# Patient Record
Sex: Female | Born: 1965
Health system: Southern US, Community
[De-identification: ages and names within clinical notes are randomized; demographics above are authoritative.]

## PROBLEM LIST (undated history)

## (undated) DIAGNOSIS — F319 Bipolar disorder, unspecified: Secondary | ICD-10-CM

## (undated) DIAGNOSIS — K760 Fatty (change of) liver, not elsewhere classified: Secondary | ICD-10-CM

## (undated) DIAGNOSIS — J309 Allergic rhinitis, unspecified: Secondary | ICD-10-CM

## (undated) DIAGNOSIS — R0989 Other specified symptoms and signs involving the circulatory and respiratory systems: Secondary | ICD-10-CM

## (undated) DIAGNOSIS — H9190 Unspecified hearing loss, unspecified ear: Secondary | ICD-10-CM

## (undated) DIAGNOSIS — K5909 Other constipation: Secondary | ICD-10-CM

## (undated) DIAGNOSIS — M7061 Trochanteric bursitis, right hip: Secondary | ICD-10-CM

## (undated) DIAGNOSIS — F329 Major depressive disorder, single episode, unspecified: Secondary | ICD-10-CM

## (undated) DIAGNOSIS — Z720 Tobacco use: Secondary | ICD-10-CM

## (undated) DIAGNOSIS — F909 Attention-deficit hyperactivity disorder, unspecified type: Secondary | ICD-10-CM

## (undated) DIAGNOSIS — M1990 Unspecified osteoarthritis, unspecified site: Secondary | ICD-10-CM

## (undated) DIAGNOSIS — M25519 Pain in unspecified shoulder: Secondary | ICD-10-CM

## (undated) DIAGNOSIS — E119 Type 2 diabetes mellitus without complications: Secondary | ICD-10-CM

## (undated) DIAGNOSIS — M549 Dorsalgia, unspecified: Secondary | ICD-10-CM

## (undated) DIAGNOSIS — G4730 Sleep apnea, unspecified: Secondary | ICD-10-CM

## (undated) DIAGNOSIS — I10 Essential (primary) hypertension: Secondary | ICD-10-CM

## (undated) DIAGNOSIS — R232 Flushing: Secondary | ICD-10-CM

## (undated) DIAGNOSIS — G43109 Migraine with aura, not intractable, without status migrainosus: Secondary | ICD-10-CM

## (undated) DIAGNOSIS — F32A Depression, unspecified: Secondary | ICD-10-CM

## (undated) DIAGNOSIS — R160 Hepatomegaly, not elsewhere classified: Secondary | ICD-10-CM

## (undated) DIAGNOSIS — E785 Hyperlipidemia, unspecified: Secondary | ICD-10-CM

## (undated) DIAGNOSIS — IMO0001 Reserved for inherently not codable concepts without codable children: Secondary | ICD-10-CM

## (undated) DIAGNOSIS — J449 Chronic obstructive pulmonary disease, unspecified: Secondary | ICD-10-CM

## (undated) DIAGNOSIS — F429 Obsessive-compulsive disorder, unspecified: Secondary | ICD-10-CM

## (undated) DIAGNOSIS — M5431 Sciatica, right side: Secondary | ICD-10-CM

## (undated) DIAGNOSIS — B070 Plantar wart: Secondary | ICD-10-CM

## (undated) DIAGNOSIS — F419 Anxiety disorder, unspecified: Secondary | ICD-10-CM

## (undated) DIAGNOSIS — K219 Gastro-esophageal reflux disease without esophagitis: Secondary | ICD-10-CM

## (undated) DIAGNOSIS — G8929 Other chronic pain: Secondary | ICD-10-CM

## (undated) DIAGNOSIS — F5104 Psychophysiologic insomnia: Secondary | ICD-10-CM

## (undated) DIAGNOSIS — E7800 Pure hypercholesterolemia, unspecified: Secondary | ICD-10-CM

## (undated) HISTORY — DX: Chronic obstructive pulmonary disease, unspecified: J44.9

## (undated) HISTORY — PX: TUBAL LIGATION: SHX77

## (undated) HISTORY — DX: Other chronic pain: G89.29

## (undated) HISTORY — DX: Major depressive disorder, single episode, unspecified: F32.9

## (undated) HISTORY — DX: Psychophysiologic insomnia: F51.04

## (undated) HISTORY — DX: Morbid (severe) obesity due to excess calories: E66.01

## (undated) HISTORY — DX: Unspecified hearing loss, unspecified ear: H91.90

## (undated) HISTORY — DX: Fatty (change of) liver, not elsewhere classified: K76.0

## (undated) HISTORY — DX: Allergic rhinitis, unspecified: J30.9

## (undated) HISTORY — DX: Essential (primary) hypertension: I10

## (undated) HISTORY — DX: Hepatomegaly, not elsewhere classified: R16.0

## (undated) HISTORY — PX: JOINT REPLACEMENT: SHX530

## (undated) HISTORY — PX: LIPOMA EXCISION: SHX5283

## (undated) HISTORY — DX: Other constipation: K59.09

## (undated) HISTORY — PX: FOOT SURGERY: SHX648

## (undated) HISTORY — PX: BILATERAL SALPINGOOPHORECTOMY: SHX1223

## (undated) HISTORY — DX: Anxiety disorder, unspecified: F41.9

## (undated) HISTORY — DX: Dorsalgia, unspecified: M54.9

## (undated) HISTORY — DX: Flushing: R23.2

## (undated) HISTORY — DX: Plantar wart: B07.0

## (undated) HISTORY — DX: Hyperlipidemia, unspecified: E78.5

## (undated) HISTORY — DX: Depression, unspecified: F32.A

## (undated) HISTORY — DX: Trochanteric bursitis, right hip: M70.61

## (undated) HISTORY — DX: Other specified symptoms and signs involving the circulatory and respiratory systems: R09.89

## (undated) HISTORY — PX: TONSILLECTOMY: SUR1361

## (undated) HISTORY — DX: Migraine with aura, not intractable, without status migrainosus: G43.109

## (undated) HISTORY — DX: Tobacco use: Z72.0

## (undated) HISTORY — DX: Obsessive-compulsive disorder, unspecified: F42.9

## (undated) HISTORY — PX: OTHER SURGICAL HISTORY: SHX169

---
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
## 2004-02-10 ENCOUNTER — Ambulatory Visit: Payer: Self-pay

## 2004-02-23 ENCOUNTER — Ambulatory Visit: Payer: Self-pay

## 2004-03-02 ENCOUNTER — Ambulatory Visit: Payer: Self-pay

## 2004-04-08 ENCOUNTER — Ambulatory Visit: Payer: Self-pay | Admitting: Internal Medicine

## 2004-04-25 ENCOUNTER — Emergency Department: Payer: Self-pay | Admitting: Emergency Medicine

## 2004-10-25 ENCOUNTER — Ambulatory Visit: Payer: Self-pay

## 2004-12-09 ENCOUNTER — Emergency Department: Payer: Self-pay | Admitting: Emergency Medicine

## 2005-03-16 ENCOUNTER — Encounter: Payer: Self-pay | Admitting: Neurosurgery

## 2005-06-15 ENCOUNTER — Ambulatory Visit: Payer: Self-pay | Admitting: Internal Medicine

## 2005-08-31 ENCOUNTER — Ambulatory Visit: Payer: Self-pay | Admitting: Anesthesiology

## 2005-09-29 ENCOUNTER — Ambulatory Visit: Payer: Self-pay | Admitting: Anesthesiology

## 2005-11-03 ENCOUNTER — Ambulatory Visit: Payer: Self-pay | Admitting: Anesthesiology

## 2005-11-23 ENCOUNTER — Ambulatory Visit: Payer: Self-pay | Admitting: Anesthesiology

## 2006-01-05 ENCOUNTER — Ambulatory Visit: Payer: Self-pay | Admitting: Anesthesiology

## 2006-02-07 ENCOUNTER — Ambulatory Visit: Payer: Self-pay | Admitting: Anesthesiology

## 2006-02-12 ENCOUNTER — Emergency Department: Payer: Self-pay | Admitting: Emergency Medicine

## 2006-05-11 ENCOUNTER — Ambulatory Visit: Payer: Self-pay | Admitting: Gastroenterology

## 2006-06-15 ENCOUNTER — Ambulatory Visit: Payer: Self-pay | Admitting: Family Medicine

## 2006-08-04 ENCOUNTER — Emergency Department: Payer: Self-pay | Admitting: Emergency Medicine

## 2006-08-05 ENCOUNTER — Other Ambulatory Visit: Payer: Self-pay

## 2006-08-31 ENCOUNTER — Ambulatory Visit: Payer: Self-pay | Admitting: Anesthesiology

## 2006-10-25 ENCOUNTER — Ambulatory Visit: Payer: Self-pay | Admitting: Unknown Physician Specialty

## 2007-03-06 ENCOUNTER — Emergency Department: Payer: Self-pay | Admitting: Unknown Physician Specialty

## 2007-03-19 ENCOUNTER — Ambulatory Visit: Payer: Self-pay | Admitting: Podiatry

## 2007-03-23 ENCOUNTER — Ambulatory Visit: Payer: Self-pay | Admitting: Podiatry

## 2007-06-05 ENCOUNTER — Ambulatory Visit: Payer: Self-pay | Admitting: Podiatry

## 2007-06-08 ENCOUNTER — Ambulatory Visit: Payer: Self-pay | Admitting: Podiatry

## 2007-12-22 ENCOUNTER — Emergency Department: Payer: Self-pay | Admitting: Emergency Medicine

## 2008-01-05 ENCOUNTER — Other Ambulatory Visit: Payer: Self-pay

## 2008-01-05 ENCOUNTER — Inpatient Hospital Stay: Payer: Self-pay | Admitting: Psychiatry

## 2008-04-07 ENCOUNTER — Ambulatory Visit: Payer: Self-pay | Admitting: Unknown Physician Specialty

## 2008-05-14 ENCOUNTER — Emergency Department: Payer: Self-pay | Admitting: Emergency Medicine

## 2008-08-11 ENCOUNTER — Ambulatory Visit: Payer: Self-pay | Admitting: General Surgery

## 2008-08-18 ENCOUNTER — Ambulatory Visit: Payer: Self-pay | Admitting: General Surgery

## 2008-10-14 ENCOUNTER — Emergency Department: Payer: Self-pay

## 2008-10-14 ENCOUNTER — Ambulatory Visit: Payer: Self-pay | Admitting: Orthopedic Surgery

## 2009-01-26 ENCOUNTER — Ambulatory Visit: Payer: Self-pay | Admitting: Family Medicine

## 2009-02-04 ENCOUNTER — Ambulatory Visit: Payer: Self-pay | Admitting: Orthopedic Surgery

## 2009-02-10 ENCOUNTER — Inpatient Hospital Stay: Payer: Self-pay | Admitting: Orthopedic Surgery

## 2009-07-09 ENCOUNTER — Ambulatory Visit: Payer: Self-pay | Admitting: Family Medicine

## 2009-08-17 ENCOUNTER — Ambulatory Visit: Payer: Self-pay | Admitting: Unknown Physician Specialty

## 2009-09-06 ENCOUNTER — Emergency Department: Payer: Self-pay | Admitting: Emergency Medicine

## 2009-12-25 ENCOUNTER — Ambulatory Visit: Payer: Self-pay | Admitting: Internal Medicine

## 2010-01-18 ENCOUNTER — Ambulatory Visit: Payer: Self-pay | Admitting: Internal Medicine

## 2010-01-22 ENCOUNTER — Ambulatory Visit: Payer: Self-pay | Admitting: Internal Medicine

## 2010-03-17 ENCOUNTER — Ambulatory Visit: Payer: Self-pay | Admitting: Gastroenterology

## 2010-04-06 ENCOUNTER — Ambulatory Visit: Payer: Self-pay | Admitting: Gastroenterology

## 2010-04-12 ENCOUNTER — Telehealth (INDEPENDENT_AMBULATORY_CARE_PROVIDER_SITE_OTHER): Payer: Self-pay | Admitting: *Deleted

## 2010-04-12 ENCOUNTER — Encounter (INDEPENDENT_AMBULATORY_CARE_PROVIDER_SITE_OTHER): Payer: Self-pay | Admitting: *Deleted

## 2010-04-12 DIAGNOSIS — R933 Abnormal findings on diagnostic imaging of other parts of digestive tract: Secondary | ICD-10-CM | POA: Insufficient documentation

## 2010-04-22 ENCOUNTER — Encounter: Payer: Self-pay | Admitting: Gastroenterology

## 2010-04-22 ENCOUNTER — Ambulatory Visit (HOSPITAL_COMMUNITY)
Admission: RE | Admit: 2010-04-22 | Discharge: 2010-04-22 | Payer: Self-pay | Source: Home / Self Care | Attending: Gastroenterology | Admitting: Gastroenterology

## 2010-05-20 ENCOUNTER — Ambulatory Visit: Payer: Self-pay | Admitting: General Surgery

## 2010-06-08 NOTE — Letter (Signed)
Summary: EGD Instructions  [facility]  [address] [place], [place] [address]   Phone: [phone]  Fax: [phone]       Wendy Johnson    February 21, 1966    MRN: [id number]       Procedure Day /Date:04/22/10 THURS     Arrival Time:630 am     Procedure Time:830 am     Location of Procedure:                     [facility] ( Outpatient Registration)    PREPARATION FOR ENDOSCOPY   On 04/22/10  THE DAY OF THE PROCEDURE:  Nothing to eat or drink after midnight  MEDICATION INSTRUCTIONS  Unless otherwise instructed, you should take regular prescription medications with a small sip of water as early as possible the morning of your procedure.  Diabetic patients - see separate instructions.     Please bring a copy of your PET/CT scan on a disk with you to your appointment        OTHER INSTRUCTIONS  You will need a responsible adult at least 43 years of age to accompany you and drive you home.   This person must remain in the waiting room during your procedure.  Wear loose fitting clothing that is easily removed.  Leave jewelry and other valuables at home.  However, you may wish to bring a book to read or an iPod/MP3 player to listen to music as you wait for your procedure to start.  Remove all body piercing jewelry and leave at home.  Total time from sign-in until discharge is approximately 2-3 hours.  You should go home directly after your procedure and rest.  You can resume normal activities the day after your procedure.  The day of your procedure you should not:   Drive   Make legal decisions   Operate machinery   Drink alcohol   Return to work  You will receive specific instructions about eating, activities and medications before you leave.    The above instructions have been reviewed and explained to me by   Chales Abrahams CMA Duncan Dull)  April 12, 2010 3:29 PM     I fully understand and can verbalize these instructions over the  phone mailed to home Date 04/12/10

## 2010-06-08 NOTE — Progress Notes (Signed)
Summary: EUS  Phone Note Outgoing Call   Call placed by: Chales Abrahams CMA Duncan Dull),  April 12, 2010 3:27 PM Summary of Call: pt scheduled for EUS need to review meds and instruct pt  pt request to call sister Boyd Kerbs with appt [phone].  No answer no message machine Initial call taken by: Chales Abrahams CMA Duncan Dull),  April 12, 2010 3:27 PM  Follow-up for Phone Call        pt aware meds reviewed and instructions mailed Follow-up by: Chales Abrahams CMA Duncan Dull),  April 13, 2010 2:17 PM  New Problems: NONSPECIFIC ABN FINDING RAD & OTH EXAM GI TRACT (ICD-793.4)   New Problems: NONSPECIFIC ABN FINDING RAD & OTH EXAM GI TRACT (ICD-793.4)

## 2010-06-08 NOTE — Letter (Signed)
Summary: Diabetic Instructions  [facility]  [address] [place], [place] [address]   Phone: [phone]  Fax: [phone]    Wendy Johnson [birth year] MRN: [id number]   X  ORAL DIABETIC MEDICATION INSTRUCTIONS  The day before your procedure:   Take your diabetic pill as you do normally  The day of your procedure:   Do not take your diabetic pill    We will check your blood sugar levels during the admission process and again in Recovery before discharging you home  ________________________________________________________________________

## 2010-06-09 ENCOUNTER — Ambulatory Visit: Payer: Self-pay | Admitting: General Surgery

## 2010-06-10 NOTE — Procedures (Signed)
Summary: Endoscopic Ultrasound  Patient: Wendy Johnson Note: All result statuses are Final unless otherwise noted.  Tests: (1) Endoscopic Ultrasound (EUS)  EUS Endoscopic Ultrasound                             DONE     [facility]     [address] [place], [place]  [address]           ENDOSCOPIC ULTRASOUND PROCEDURE REPORT     PATIENT:  Wendy, Johnson  MR#:  [id number]     BIRTHDATE:  Sep 30, 1965  GENDER:  female     ENDOSCOPIST:  Rachael Fee, MD     Referred by:   Barnetta Chapel, MD     PROCEDURE DATE:  04/22/2010     PROCEDURE:  Upper EUS w/FNA     ASA CLASS:  Class II     INDICATIONS:  chest pains led to imaging; CT showed soft tissue     mass abutting/involving the mid esophagus; barium esophagram and     EGD confirmed bulging inward in mid esophagus with normal     overlying mucosa; PET scan showed the lesion is not PET avid     MEDICATIONS:  MAC sedation, administered by CRNA, 400mg  cipro IV     DESCRIPTION OF PROCEDURE:   After the risks benefits and     alternatives of the procedure were  explained, informed consent     was obtained. The patient was then placed in the left, lateral,     decubitus postion and IV sedation was administered. Throughout the     procedure, the patient's blood pressure, pulse and oxygen     saturations were monitored continuously.  Under direct     visualization, the  endoscope was introduced through the mouth and     advanced to the stomach antrum.  Water was used as necessary to     provide an acoustic interface.  Upon completion of the imaging,     water was removed and the patient was sent to the recovery room in     satisfactory condition.     <<PROCEDUREIMAGES>>     Endoscopic findings:     1. Subepithelial bulging inward of esophagus around 25cm from     incisors. The overlying mucosa was normal.     2. Otherwiser normal esophagus     3. Normal stomach           EUS findings:     1. The bulging above  corresponded with a hypoechoic, homogeneous     lesion that measured 2cm by 2cm and was 4cm long (cranial     caudally).  This was confined to mucosa/deep mucosal layers of     esophageal wall. The muscularis propria layer may be focally     involved but largely remained intact.  This was sampled with 2     passes of a 19 gauge Flex BS EUS FNA needle using color doppler to     avoid significant vessels.     2. There was an elongated, hypoechoic, benign appearing subcarinal     lymphnode that measured 1.5cm long. This was clearly separate from     the esophageal wall lesion.     3. Limited views of liver, spleen, pancreas were all normal           Impression:     2cm by 2cm (  4cm cranial/caudal dimension) subepithelial lesion     confined to the wall of esophagus.  Ddx includes leiomyoma,     carcinoid, granular cell tumor, perhaps an esophageal duplication     cyst containing debris/sludge.  It is not clear if this causes her     chest pains.  Await pathology for final recommendations.           ______________________________     Rachael Fee, MD           n.     eSIGNED:   Rachael Fee at 04/22/2010 09:00 AM           [place], [place], [id number]  Note: An exclamation mark (!) indicates a result that was not dispersed into the flowsheet. Document Creation Date: 04/22/2010 9:00 AM _______________________________________________________________________  (1) Order result status: Final Collection or observation date-time: 04/22/2010 08:44 Requested date-time:  Receipt date-time:  Reported date-time:  Referring Physician:   Ordering Physician: Rob Bunting [phone]) Specimen Source:  Source: Launa Grill Order Number: [id number] Lab site:

## 2010-07-19 LAB — GLUCOSE, CAPILLARY: Glucose-Capillary: 106 mg/dL — ABNORMAL HIGH (ref 70–99)

## 2010-12-08 ENCOUNTER — Ambulatory Visit: Payer: Self-pay | Admitting: Family Medicine

## 2010-12-11 ENCOUNTER — Emergency Department: Payer: Self-pay | Admitting: Emergency Medicine

## 2011-02-12 ENCOUNTER — Emergency Department: Payer: Self-pay | Admitting: Emergency Medicine

## 2011-05-16 DIAGNOSIS — M533 Sacrococcygeal disorders, not elsewhere classified: Secondary | ICD-10-CM | POA: Diagnosis not present

## 2011-05-23 ENCOUNTER — Ambulatory Visit: Payer: Self-pay

## 2011-05-23 DIAGNOSIS — M545 Low back pain, unspecified: Secondary | ICD-10-CM | POA: Diagnosis not present

## 2011-05-23 DIAGNOSIS — M533 Sacrococcygeal disorders, not elsewhere classified: Secondary | ICD-10-CM | POA: Diagnosis not present

## 2011-07-14 DIAGNOSIS — E119 Type 2 diabetes mellitus without complications: Secondary | ICD-10-CM | POA: Diagnosis not present

## 2011-07-14 DIAGNOSIS — J449 Chronic obstructive pulmonary disease, unspecified: Secondary | ICD-10-CM | POA: Diagnosis not present

## 2011-07-14 DIAGNOSIS — M549 Dorsalgia, unspecified: Secondary | ICD-10-CM | POA: Diagnosis not present

## 2011-07-14 DIAGNOSIS — R0989 Other specified symptoms and signs involving the circulatory and respiratory systems: Secondary | ICD-10-CM | POA: Diagnosis not present

## 2011-08-01 DIAGNOSIS — M1990 Unspecified osteoarthritis, unspecified site: Secondary | ICD-10-CM | POA: Diagnosis not present

## 2011-08-01 DIAGNOSIS — M79609 Pain in unspecified limb: Secondary | ICD-10-CM | POA: Diagnosis not present

## 2011-08-01 DIAGNOSIS — I8310 Varicose veins of unspecified lower extremity with inflammation: Secondary | ICD-10-CM | POA: Diagnosis not present

## 2011-08-01 DIAGNOSIS — M5430 Sciatica, unspecified side: Secondary | ICD-10-CM | POA: Diagnosis not present

## 2011-08-15 DIAGNOSIS — H903 Sensorineural hearing loss, bilateral: Secondary | ICD-10-CM | POA: Diagnosis not present

## 2011-08-15 DIAGNOSIS — H905 Unspecified sensorineural hearing loss: Secondary | ICD-10-CM | POA: Diagnosis not present

## 2011-08-31 DIAGNOSIS — F39 Unspecified mood [affective] disorder: Secondary | ICD-10-CM | POA: Diagnosis not present

## 2011-09-14 DIAGNOSIS — B351 Tinea unguium: Secondary | ICD-10-CM | POA: Diagnosis not present

## 2011-09-14 DIAGNOSIS — M79609 Pain in unspecified limb: Secondary | ICD-10-CM | POA: Diagnosis not present

## 2011-10-05 DIAGNOSIS — M795 Residual foreign body in soft tissue: Secondary | ICD-10-CM | POA: Diagnosis not present

## 2011-10-24 DIAGNOSIS — E119 Type 2 diabetes mellitus without complications: Secondary | ICD-10-CM | POA: Diagnosis not present

## 2011-10-24 DIAGNOSIS — I10 Essential (primary) hypertension: Secondary | ICD-10-CM | POA: Diagnosis not present

## 2011-10-24 DIAGNOSIS — J309 Allergic rhinitis, unspecified: Secondary | ICD-10-CM | POA: Diagnosis not present

## 2011-10-24 DIAGNOSIS — E785 Hyperlipidemia, unspecified: Secondary | ICD-10-CM | POA: Diagnosis not present

## 2011-10-24 DIAGNOSIS — M549 Dorsalgia, unspecified: Secondary | ICD-10-CM | POA: Diagnosis not present

## 2011-10-24 DIAGNOSIS — K219 Gastro-esophageal reflux disease without esophagitis: Secondary | ICD-10-CM | POA: Diagnosis not present

## 2011-10-26 DIAGNOSIS — F39 Unspecified mood [affective] disorder: Secondary | ICD-10-CM | POA: Diagnosis not present

## 2011-11-07 DIAGNOSIS — Z96659 Presence of unspecified artificial knee joint: Secondary | ICD-10-CM | POA: Diagnosis not present

## 2011-11-14 DIAGNOSIS — K5900 Constipation, unspecified: Secondary | ICD-10-CM | POA: Diagnosis not present

## 2011-11-14 DIAGNOSIS — Z1239 Encounter for other screening for malignant neoplasm of breast: Secondary | ICD-10-CM | POA: Diagnosis not present

## 2011-11-14 DIAGNOSIS — Z124 Encounter for screening for malignant neoplasm of cervix: Secondary | ICD-10-CM | POA: Diagnosis not present

## 2011-11-14 DIAGNOSIS — M2540 Effusion, unspecified joint: Secondary | ICD-10-CM | POA: Diagnosis not present

## 2011-11-14 DIAGNOSIS — R32 Unspecified urinary incontinence: Secondary | ICD-10-CM | POA: Diagnosis not present

## 2011-11-14 DIAGNOSIS — Z0000 Encounter for general adult medical examination without abnormal findings: Secondary | ICD-10-CM | POA: Diagnosis not present

## 2011-11-14 DIAGNOSIS — Z113 Encounter for screening for infections with a predominantly sexual mode of transmission: Secondary | ICD-10-CM | POA: Diagnosis not present

## 2011-11-23 DIAGNOSIS — Z96659 Presence of unspecified artificial knee joint: Secondary | ICD-10-CM | POA: Diagnosis not present

## 2011-12-10 ENCOUNTER — Emergency Department: Payer: Self-pay | Admitting: *Deleted

## 2011-12-14 ENCOUNTER — Ambulatory Visit: Payer: Self-pay | Admitting: Orthopedic Surgery

## 2011-12-14 DIAGNOSIS — M238X9 Other internal derangements of unspecified knee: Secondary | ICD-10-CM | POA: Diagnosis not present

## 2011-12-14 DIAGNOSIS — F17200 Nicotine dependence, unspecified, uncomplicated: Secondary | ICD-10-CM | POA: Diagnosis not present

## 2011-12-14 DIAGNOSIS — F329 Major depressive disorder, single episode, unspecified: Secondary | ICD-10-CM | POA: Diagnosis not present

## 2011-12-14 DIAGNOSIS — E119 Type 2 diabetes mellitus without complications: Secondary | ICD-10-CM | POA: Diagnosis not present

## 2011-12-14 DIAGNOSIS — Z01810 Encounter for preprocedural cardiovascular examination: Secondary | ICD-10-CM | POA: Diagnosis not present

## 2011-12-14 DIAGNOSIS — M129 Arthropathy, unspecified: Secondary | ICD-10-CM | POA: Diagnosis not present

## 2011-12-14 DIAGNOSIS — Z96659 Presence of unspecified artificial knee joint: Secondary | ICD-10-CM | POA: Diagnosis not present

## 2011-12-14 DIAGNOSIS — Z79899 Other long term (current) drug therapy: Secondary | ICD-10-CM | POA: Diagnosis not present

## 2011-12-14 DIAGNOSIS — Z01812 Encounter for preprocedural laboratory examination: Secondary | ICD-10-CM | POA: Diagnosis not present

## 2011-12-14 DIAGNOSIS — I10 Essential (primary) hypertension: Secondary | ICD-10-CM | POA: Diagnosis not present

## 2011-12-14 DIAGNOSIS — Z8541 Personal history of malignant neoplasm of cervix uteri: Secondary | ICD-10-CM | POA: Diagnosis not present

## 2011-12-14 DIAGNOSIS — I119 Hypertensive heart disease without heart failure: Secondary | ICD-10-CM | POA: Diagnosis not present

## 2011-12-14 LAB — CBC
Platelet: 260 10*3/uL (ref 150–440)
RDW: 13.7 % (ref 11.5–14.5)
WBC: 8.3 10*3/uL (ref 3.6–11.0)

## 2011-12-14 LAB — BASIC METABOLIC PANEL
Anion Gap: 6 — ABNORMAL LOW (ref 7–16)
BUN: 13 mg/dL (ref 7–18)
Chloride: 107 mmol/L (ref 98–107)
Co2: 27 mmol/L (ref 21–32)
EGFR (African American): >60
Glucose: 97 mg/dL (ref 65–99)
Osmolality: 279 (ref 275–301)

## 2011-12-20 ENCOUNTER — Inpatient Hospital Stay: Payer: Self-pay | Admitting: Orthopedic Surgery

## 2011-12-20 DIAGNOSIS — M129 Arthropathy, unspecified: Secondary | ICD-10-CM | POA: Diagnosis present

## 2011-12-20 DIAGNOSIS — Z96659 Presence of unspecified artificial knee joint: Secondary | ICD-10-CM | POA: Diagnosis not present

## 2011-12-20 DIAGNOSIS — T84039A Mechanical loosening of unspecified internal prosthetic joint, initial encounter: Secondary | ICD-10-CM | POA: Diagnosis not present

## 2011-12-20 DIAGNOSIS — T84498A Other mechanical complication of other internal orthopedic devices, implants and grafts, initial encounter: Secondary | ICD-10-CM | POA: Diagnosis present

## 2011-12-20 DIAGNOSIS — T8450XA Infection and inflammatory reaction due to unspecified internal joint prosthesis, initial encounter: Secondary | ICD-10-CM | POA: Diagnosis not present

## 2011-12-20 DIAGNOSIS — I10 Essential (primary) hypertension: Secondary | ICD-10-CM | POA: Diagnosis present

## 2011-12-20 DIAGNOSIS — E119 Type 2 diabetes mellitus without complications: Secondary | ICD-10-CM | POA: Diagnosis present

## 2011-12-20 DIAGNOSIS — E785 Hyperlipidemia, unspecified: Secondary | ICD-10-CM | POA: Diagnosis present

## 2011-12-20 DIAGNOSIS — Z471 Aftercare following joint replacement surgery: Secondary | ICD-10-CM | POA: Diagnosis not present

## 2011-12-20 DIAGNOSIS — F329 Major depressive disorder, single episode, unspecified: Secondary | ICD-10-CM | POA: Diagnosis present

## 2011-12-20 DIAGNOSIS — Z885 Allergy status to narcotic agent status: Secondary | ICD-10-CM | POA: Diagnosis not present

## 2011-12-21 LAB — BASIC METABOLIC PANEL
BUN: 7 mg/dL (ref 7–18)
Calcium, Total: 8.3 mg/dL — ABNORMAL LOW (ref 8.5–10.1)
Creatinine: 0.14 mg/dL — ABNORMAL LOW (ref 0.60–1.30)
Glucose: 92 mg/dL (ref 65–99)
Potassium: 5.1 mmol/L (ref 3.5–5.1)
Sodium: 136 mmol/L (ref 136–145)

## 2011-12-21 LAB — PLATELET COUNT: Platelet: 222 10*3/uL (ref 150–440)

## 2011-12-23 DIAGNOSIS — F319 Bipolar disorder, unspecified: Secondary | ICD-10-CM | POA: Diagnosis not present

## 2011-12-23 DIAGNOSIS — E119 Type 2 diabetes mellitus without complications: Secondary | ICD-10-CM | POA: Diagnosis not present

## 2011-12-23 DIAGNOSIS — I10 Essential (primary) hypertension: Secondary | ICD-10-CM | POA: Diagnosis not present

## 2011-12-23 DIAGNOSIS — Z471 Aftercare following joint replacement surgery: Secondary | ICD-10-CM | POA: Diagnosis not present

## 2011-12-23 DIAGNOSIS — K219 Gastro-esophageal reflux disease without esophagitis: Secondary | ICD-10-CM | POA: Diagnosis not present

## 2011-12-23 DIAGNOSIS — J449 Chronic obstructive pulmonary disease, unspecified: Secondary | ICD-10-CM | POA: Diagnosis not present

## 2011-12-26 DIAGNOSIS — I10 Essential (primary) hypertension: Secondary | ICD-10-CM | POA: Diagnosis not present

## 2011-12-26 DIAGNOSIS — K219 Gastro-esophageal reflux disease without esophagitis: Secondary | ICD-10-CM | POA: Diagnosis not present

## 2011-12-26 DIAGNOSIS — J449 Chronic obstructive pulmonary disease, unspecified: Secondary | ICD-10-CM | POA: Diagnosis not present

## 2011-12-26 DIAGNOSIS — E119 Type 2 diabetes mellitus without complications: Secondary | ICD-10-CM | POA: Diagnosis not present

## 2011-12-26 DIAGNOSIS — F319 Bipolar disorder, unspecified: Secondary | ICD-10-CM | POA: Diagnosis not present

## 2011-12-26 DIAGNOSIS — Z471 Aftercare following joint replacement surgery: Secondary | ICD-10-CM | POA: Diagnosis not present

## 2011-12-27 DIAGNOSIS — E119 Type 2 diabetes mellitus without complications: Secondary | ICD-10-CM | POA: Diagnosis not present

## 2011-12-27 DIAGNOSIS — K219 Gastro-esophageal reflux disease without esophagitis: Secondary | ICD-10-CM | POA: Diagnosis not present

## 2011-12-27 DIAGNOSIS — F319 Bipolar disorder, unspecified: Secondary | ICD-10-CM | POA: Diagnosis not present

## 2011-12-27 DIAGNOSIS — J449 Chronic obstructive pulmonary disease, unspecified: Secondary | ICD-10-CM | POA: Diagnosis not present

## 2011-12-27 DIAGNOSIS — I10 Essential (primary) hypertension: Secondary | ICD-10-CM | POA: Diagnosis not present

## 2011-12-27 DIAGNOSIS — Z471 Aftercare following joint replacement surgery: Secondary | ICD-10-CM | POA: Diagnosis not present

## 2011-12-28 DIAGNOSIS — K219 Gastro-esophageal reflux disease without esophagitis: Secondary | ICD-10-CM | POA: Diagnosis not present

## 2011-12-28 DIAGNOSIS — I10 Essential (primary) hypertension: Secondary | ICD-10-CM | POA: Diagnosis not present

## 2011-12-28 DIAGNOSIS — E119 Type 2 diabetes mellitus without complications: Secondary | ICD-10-CM | POA: Diagnosis not present

## 2011-12-28 DIAGNOSIS — Z471 Aftercare following joint replacement surgery: Secondary | ICD-10-CM | POA: Diagnosis not present

## 2011-12-28 DIAGNOSIS — F319 Bipolar disorder, unspecified: Secondary | ICD-10-CM | POA: Diagnosis not present

## 2011-12-28 DIAGNOSIS — J449 Chronic obstructive pulmonary disease, unspecified: Secondary | ICD-10-CM | POA: Diagnosis not present

## 2011-12-29 DIAGNOSIS — K219 Gastro-esophageal reflux disease without esophagitis: Secondary | ICD-10-CM | POA: Diagnosis not present

## 2011-12-29 DIAGNOSIS — I10 Essential (primary) hypertension: Secondary | ICD-10-CM | POA: Diagnosis not present

## 2011-12-29 DIAGNOSIS — F319 Bipolar disorder, unspecified: Secondary | ICD-10-CM | POA: Diagnosis not present

## 2011-12-29 DIAGNOSIS — Z471 Aftercare following joint replacement surgery: Secondary | ICD-10-CM | POA: Diagnosis not present

## 2011-12-29 DIAGNOSIS — E119 Type 2 diabetes mellitus without complications: Secondary | ICD-10-CM | POA: Diagnosis not present

## 2011-12-29 DIAGNOSIS — J449 Chronic obstructive pulmonary disease, unspecified: Secondary | ICD-10-CM | POA: Diagnosis not present

## 2012-01-03 DIAGNOSIS — J449 Chronic obstructive pulmonary disease, unspecified: Secondary | ICD-10-CM | POA: Diagnosis not present

## 2012-01-03 DIAGNOSIS — Z471 Aftercare following joint replacement surgery: Secondary | ICD-10-CM | POA: Diagnosis not present

## 2012-01-03 DIAGNOSIS — E119 Type 2 diabetes mellitus without complications: Secondary | ICD-10-CM | POA: Diagnosis not present

## 2012-01-03 DIAGNOSIS — I10 Essential (primary) hypertension: Secondary | ICD-10-CM | POA: Diagnosis not present

## 2012-01-03 DIAGNOSIS — K219 Gastro-esophageal reflux disease without esophagitis: Secondary | ICD-10-CM | POA: Diagnosis not present

## 2012-01-03 DIAGNOSIS — Z96659 Presence of unspecified artificial knee joint: Secondary | ICD-10-CM | POA: Diagnosis not present

## 2012-01-03 DIAGNOSIS — F319 Bipolar disorder, unspecified: Secondary | ICD-10-CM | POA: Diagnosis not present

## 2012-01-05 DIAGNOSIS — F319 Bipolar disorder, unspecified: Secondary | ICD-10-CM | POA: Diagnosis not present

## 2012-01-05 DIAGNOSIS — E119 Type 2 diabetes mellitus without complications: Secondary | ICD-10-CM | POA: Diagnosis not present

## 2012-01-05 DIAGNOSIS — J449 Chronic obstructive pulmonary disease, unspecified: Secondary | ICD-10-CM | POA: Diagnosis not present

## 2012-01-05 DIAGNOSIS — K219 Gastro-esophageal reflux disease without esophagitis: Secondary | ICD-10-CM | POA: Diagnosis not present

## 2012-01-05 DIAGNOSIS — Z471 Aftercare following joint replacement surgery: Secondary | ICD-10-CM | POA: Diagnosis not present

## 2012-01-05 DIAGNOSIS — I10 Essential (primary) hypertension: Secondary | ICD-10-CM | POA: Diagnosis not present

## 2012-01-06 DIAGNOSIS — F319 Bipolar disorder, unspecified: Secondary | ICD-10-CM | POA: Diagnosis not present

## 2012-01-06 DIAGNOSIS — J449 Chronic obstructive pulmonary disease, unspecified: Secondary | ICD-10-CM | POA: Diagnosis not present

## 2012-01-06 DIAGNOSIS — I10 Essential (primary) hypertension: Secondary | ICD-10-CM | POA: Diagnosis not present

## 2012-01-06 DIAGNOSIS — K219 Gastro-esophageal reflux disease without esophagitis: Secondary | ICD-10-CM | POA: Diagnosis not present

## 2012-01-06 DIAGNOSIS — E119 Type 2 diabetes mellitus without complications: Secondary | ICD-10-CM | POA: Diagnosis not present

## 2012-01-06 DIAGNOSIS — Z471 Aftercare following joint replacement surgery: Secondary | ICD-10-CM | POA: Diagnosis not present

## 2012-01-11 DIAGNOSIS — E119 Type 2 diabetes mellitus without complications: Secondary | ICD-10-CM | POA: Diagnosis not present

## 2012-01-11 DIAGNOSIS — I10 Essential (primary) hypertension: Secondary | ICD-10-CM | POA: Diagnosis not present

## 2012-01-11 DIAGNOSIS — J449 Chronic obstructive pulmonary disease, unspecified: Secondary | ICD-10-CM | POA: Diagnosis not present

## 2012-01-11 DIAGNOSIS — K219 Gastro-esophageal reflux disease without esophagitis: Secondary | ICD-10-CM | POA: Diagnosis not present

## 2012-01-11 DIAGNOSIS — Z471 Aftercare following joint replacement surgery: Secondary | ICD-10-CM | POA: Diagnosis not present

## 2012-01-11 DIAGNOSIS — F319 Bipolar disorder, unspecified: Secondary | ICD-10-CM | POA: Diagnosis not present

## 2012-02-13 DIAGNOSIS — G4700 Insomnia, unspecified: Secondary | ICD-10-CM | POA: Diagnosis not present

## 2012-02-13 DIAGNOSIS — E785 Hyperlipidemia, unspecified: Secondary | ICD-10-CM | POA: Diagnosis not present

## 2012-02-13 DIAGNOSIS — G894 Chronic pain syndrome: Secondary | ICD-10-CM | POA: Diagnosis not present

## 2012-02-13 DIAGNOSIS — I10 Essential (primary) hypertension: Secondary | ICD-10-CM | POA: Diagnosis not present

## 2012-02-13 DIAGNOSIS — Z23 Encounter for immunization: Secondary | ICD-10-CM | POA: Diagnosis not present

## 2012-02-21 DIAGNOSIS — F39 Unspecified mood [affective] disorder: Secondary | ICD-10-CM | POA: Diagnosis not present

## 2012-03-19 DIAGNOSIS — J449 Chronic obstructive pulmonary disease, unspecified: Secondary | ICD-10-CM | POA: Diagnosis not present

## 2012-03-19 DIAGNOSIS — F17200 Nicotine dependence, unspecified, uncomplicated: Secondary | ICD-10-CM | POA: Diagnosis not present

## 2012-03-19 DIAGNOSIS — R072 Precordial pain: Secondary | ICD-10-CM | POA: Diagnosis not present

## 2012-03-19 DIAGNOSIS — R0602 Shortness of breath: Secondary | ICD-10-CM | POA: Diagnosis not present

## 2012-03-21 ENCOUNTER — Ambulatory Visit: Payer: Self-pay | Admitting: Internal Medicine

## 2012-03-21 DIAGNOSIS — Z96659 Presence of unspecified artificial knee joint: Secondary | ICD-10-CM | POA: Diagnosis not present

## 2012-03-21 DIAGNOSIS — R079 Chest pain, unspecified: Secondary | ICD-10-CM | POA: Diagnosis not present

## 2012-03-21 DIAGNOSIS — M67919 Unspecified disorder of synovium and tendon, unspecified shoulder: Secondary | ICD-10-CM | POA: Diagnosis not present

## 2012-03-21 DIAGNOSIS — M719 Bursopathy, unspecified: Secondary | ICD-10-CM | POA: Diagnosis not present

## 2012-03-21 DIAGNOSIS — R0602 Shortness of breath: Secondary | ICD-10-CM | POA: Diagnosis not present

## 2012-05-14 DIAGNOSIS — M67919 Unspecified disorder of synovium and tendon, unspecified shoulder: Secondary | ICD-10-CM | POA: Diagnosis not present

## 2012-05-14 DIAGNOSIS — M719 Bursopathy, unspecified: Secondary | ICD-10-CM | POA: Diagnosis not present

## 2012-05-18 ENCOUNTER — Ambulatory Visit: Payer: Self-pay | Admitting: Orthopedic Surgery

## 2012-05-18 DIAGNOSIS — G43909 Migraine, unspecified, not intractable, without status migrainosus: Secondary | ICD-10-CM | POA: Diagnosis not present

## 2012-05-18 DIAGNOSIS — I10 Essential (primary) hypertension: Secondary | ICD-10-CM | POA: Diagnosis not present

## 2012-05-18 DIAGNOSIS — M549 Dorsalgia, unspecified: Secondary | ICD-10-CM | POA: Diagnosis not present

## 2012-05-18 DIAGNOSIS — F319 Bipolar disorder, unspecified: Secondary | ICD-10-CM | POA: Diagnosis not present

## 2012-05-18 DIAGNOSIS — M25519 Pain in unspecified shoulder: Secondary | ICD-10-CM | POA: Diagnosis not present

## 2012-05-25 DIAGNOSIS — M19019 Primary osteoarthritis, unspecified shoulder: Secondary | ICD-10-CM | POA: Diagnosis not present

## 2012-05-30 DIAGNOSIS — F39 Unspecified mood [affective] disorder: Secondary | ICD-10-CM | POA: Diagnosis not present

## 2012-06-19 DIAGNOSIS — B86 Scabies: Secondary | ICD-10-CM | POA: Diagnosis not present

## 2012-06-19 DIAGNOSIS — E785 Hyperlipidemia, unspecified: Secondary | ICD-10-CM | POA: Diagnosis not present

## 2012-06-19 DIAGNOSIS — D72829 Elevated white blood cell count, unspecified: Secondary | ICD-10-CM | POA: Diagnosis not present

## 2012-06-19 DIAGNOSIS — I10 Essential (primary) hypertension: Secondary | ICD-10-CM | POA: Diagnosis not present

## 2012-06-19 DIAGNOSIS — N951 Menopausal and female climacteric states: Secondary | ICD-10-CM | POA: Diagnosis not present

## 2012-06-19 DIAGNOSIS — R7309 Other abnormal glucose: Secondary | ICD-10-CM | POA: Diagnosis not present

## 2012-06-19 DIAGNOSIS — E8881 Metabolic syndrome: Secondary | ICD-10-CM | POA: Diagnosis not present

## 2012-07-01 ENCOUNTER — Emergency Department: Payer: Self-pay | Admitting: Emergency Medicine

## 2012-07-01 DIAGNOSIS — J029 Acute pharyngitis, unspecified: Secondary | ICD-10-CM | POA: Diagnosis not present

## 2012-07-01 DIAGNOSIS — K219 Gastro-esophageal reflux disease without esophagitis: Secondary | ICD-10-CM | POA: Diagnosis not present

## 2012-07-01 DIAGNOSIS — E785 Hyperlipidemia, unspecified: Secondary | ICD-10-CM | POA: Diagnosis not present

## 2012-07-01 DIAGNOSIS — Z79899 Other long term (current) drug therapy: Secondary | ICD-10-CM | POA: Diagnosis not present

## 2012-07-01 DIAGNOSIS — J40 Bronchitis, not specified as acute or chronic: Secondary | ICD-10-CM | POA: Diagnosis not present

## 2012-07-01 DIAGNOSIS — I10 Essential (primary) hypertension: Secondary | ICD-10-CM | POA: Diagnosis not present

## 2012-07-01 DIAGNOSIS — J209 Acute bronchitis, unspecified: Secondary | ICD-10-CM | POA: Diagnosis not present

## 2012-07-17 DIAGNOSIS — F39 Unspecified mood [affective] disorder: Secondary | ICD-10-CM | POA: Diagnosis not present

## 2012-07-25 DIAGNOSIS — F39 Unspecified mood [affective] disorder: Secondary | ICD-10-CM | POA: Diagnosis not present

## 2012-08-02 ENCOUNTER — Emergency Department: Payer: Self-pay | Admitting: Internal Medicine

## 2012-08-02 DIAGNOSIS — J449 Chronic obstructive pulmonary disease, unspecified: Secondary | ICD-10-CM | POA: Diagnosis not present

## 2012-08-02 DIAGNOSIS — Z79899 Other long term (current) drug therapy: Secondary | ICD-10-CM | POA: Diagnosis not present

## 2012-08-02 DIAGNOSIS — E119 Type 2 diabetes mellitus without complications: Secondary | ICD-10-CM | POA: Diagnosis not present

## 2012-08-02 DIAGNOSIS — I10 Essential (primary) hypertension: Secondary | ICD-10-CM | POA: Diagnosis not present

## 2012-08-02 DIAGNOSIS — IMO0002 Reserved for concepts with insufficient information to code with codable children: Secondary | ICD-10-CM | POA: Diagnosis not present

## 2012-08-02 DIAGNOSIS — F17200 Nicotine dependence, unspecified, uncomplicated: Secondary | ICD-10-CM | POA: Diagnosis not present

## 2012-08-07 DIAGNOSIS — IMO0002 Reserved for concepts with insufficient information to code with codable children: Secondary | ICD-10-CM | POA: Diagnosis not present

## 2012-09-14 DIAGNOSIS — J449 Chronic obstructive pulmonary disease, unspecified: Secondary | ICD-10-CM | POA: Diagnosis not present

## 2012-09-14 DIAGNOSIS — Z2089 Contact with and (suspected) exposure to other communicable diseases: Secondary | ICD-10-CM | POA: Diagnosis not present

## 2012-09-14 DIAGNOSIS — R300 Dysuria: Secondary | ICD-10-CM | POA: Diagnosis not present

## 2012-09-14 DIAGNOSIS — M549 Dorsalgia, unspecified: Secondary | ICD-10-CM | POA: Diagnosis not present

## 2012-09-14 DIAGNOSIS — I10 Essential (primary) hypertension: Secondary | ICD-10-CM | POA: Diagnosis not present

## 2012-11-20 DIAGNOSIS — Z9181 History of falling: Secondary | ICD-10-CM | POA: Diagnosis not present

## 2012-11-20 DIAGNOSIS — Z1331 Encounter for screening for depression: Secondary | ICD-10-CM | POA: Diagnosis not present

## 2012-11-20 DIAGNOSIS — Z0000 Encounter for general adult medical examination without abnormal findings: Secondary | ICD-10-CM | POA: Diagnosis not present

## 2012-11-20 DIAGNOSIS — R300 Dysuria: Secondary | ICD-10-CM | POA: Diagnosis not present

## 2012-12-24 ENCOUNTER — Ambulatory Visit: Payer: Self-pay | Admitting: Family Medicine

## 2012-12-24 DIAGNOSIS — Z1231 Encounter for screening mammogram for malignant neoplasm of breast: Secondary | ICD-10-CM | POA: Diagnosis not present

## 2013-01-09 DIAGNOSIS — F39 Unspecified mood [affective] disorder: Secondary | ICD-10-CM | POA: Diagnosis not present

## 2013-02-20 DIAGNOSIS — I10 Essential (primary) hypertension: Secondary | ICD-10-CM | POA: Diagnosis not present

## 2013-02-20 DIAGNOSIS — R1011 Right upper quadrant pain: Secondary | ICD-10-CM | POA: Diagnosis not present

## 2013-02-20 DIAGNOSIS — E785 Hyperlipidemia, unspecified: Secondary | ICD-10-CM | POA: Diagnosis not present

## 2013-02-20 DIAGNOSIS — R7309 Other abnormal glucose: Secondary | ICD-10-CM | POA: Diagnosis not present

## 2013-02-20 DIAGNOSIS — Z23 Encounter for immunization: Secondary | ICD-10-CM | POA: Diagnosis not present

## 2013-02-22 ENCOUNTER — Ambulatory Visit: Payer: Self-pay | Admitting: Family Medicine

## 2013-02-22 DIAGNOSIS — R1011 Right upper quadrant pain: Secondary | ICD-10-CM | POA: Diagnosis not present

## 2013-02-22 DIAGNOSIS — K7689 Other specified diseases of liver: Secondary | ICD-10-CM | POA: Diagnosis not present

## 2013-02-28 ENCOUNTER — Ambulatory Visit: Payer: Self-pay | Admitting: Family Medicine

## 2013-03-05 ENCOUNTER — Ambulatory Visit: Payer: Self-pay | Admitting: Family Medicine

## 2013-03-05 DIAGNOSIS — R109 Unspecified abdominal pain: Secondary | ICD-10-CM | POA: Diagnosis not present

## 2013-03-06 DIAGNOSIS — F39 Unspecified mood [affective] disorder: Secondary | ICD-10-CM | POA: Diagnosis not present

## 2013-03-13 DIAGNOSIS — R1031 Right lower quadrant pain: Secondary | ICD-10-CM | POA: Diagnosis not present

## 2013-03-13 DIAGNOSIS — R1011 Right upper quadrant pain: Secondary | ICD-10-CM | POA: Diagnosis not present

## 2013-03-25 DIAGNOSIS — I10 Essential (primary) hypertension: Secondary | ICD-10-CM | POA: Diagnosis not present

## 2013-03-25 DIAGNOSIS — G8929 Other chronic pain: Secondary | ICD-10-CM | POA: Diagnosis not present

## 2013-03-25 DIAGNOSIS — M549 Dorsalgia, unspecified: Secondary | ICD-10-CM | POA: Diagnosis not present

## 2013-03-25 DIAGNOSIS — G43909 Migraine, unspecified, not intractable, without status migrainosus: Secondary | ICD-10-CM | POA: Diagnosis not present

## 2013-04-12 DIAGNOSIS — M6580 Other synovitis and tenosynovitis, unspecified site: Secondary | ICD-10-CM | POA: Diagnosis not present

## 2013-04-17 ENCOUNTER — Emergency Department: Payer: Self-pay | Admitting: Emergency Medicine

## 2013-04-17 DIAGNOSIS — F329 Major depressive disorder, single episode, unspecified: Secondary | ICD-10-CM | POA: Diagnosis not present

## 2013-04-17 DIAGNOSIS — T43024A Poisoning by tetracyclic antidepressants, undetermined, initial encounter: Secondary | ICD-10-CM | POA: Diagnosis not present

## 2013-04-17 DIAGNOSIS — T43294A Poisoning by other antidepressants, undetermined, initial encounter: Secondary | ICD-10-CM | POA: Diagnosis not present

## 2013-04-17 DIAGNOSIS — F3189 Other bipolar disorder: Secondary | ICD-10-CM | POA: Diagnosis not present

## 2013-04-17 DIAGNOSIS — E109 Type 1 diabetes mellitus without complications: Secondary | ICD-10-CM | POA: Diagnosis not present

## 2013-04-17 DIAGNOSIS — J449 Chronic obstructive pulmonary disease, unspecified: Secondary | ICD-10-CM | POA: Diagnosis not present

## 2013-04-17 LAB — COMPREHENSIVE METABOLIC PANEL
Albumin: 3.7 g/dL (ref 3.4–5.0)
Anion Gap: 8 (ref 7–16)
Creatinine: 0.53 mg/dL — ABNORMAL LOW (ref 0.60–1.30)
EGFR (African American): >60
EGFR (Non-African Amer.): >60
Glucose: 105 mg/dL — ABNORMAL HIGH (ref 65–99)
Osmolality: 276 (ref 275–301)
SGPT (ALT): 49 U/L (ref 12–78)
Sodium: 139 mmol/L (ref 136–145)
Total Protein: 7.9 g/dL (ref 6.4–8.2)

## 2013-04-17 LAB — URINALYSIS, COMPLETE
Bacteria: NONE SEEN
Blood: NEGATIVE
Glucose,UR: NEGATIVE mg/dL (ref 0–75)
Hyaline Cast: 2
Nitrite: NEGATIVE
Protein: NEGATIVE
Specific Gravity: 1.008 (ref 1.003–1.030)
Squamous Epithelial: 3
WBC UR: 3 /HPF (ref 0–5)

## 2013-04-17 LAB — DRUG SCREEN, URINE
Cannabinoid 50 Ng, Ur ~~LOC~~: NEGATIVE (ref ?–50)
Cocaine Metabolite,Ur ~~LOC~~: NEGATIVE (ref ?–300)
Opiate, Ur Screen: NEGATIVE (ref ?–300)
Phencyclidine (PCP) Ur S: NEGATIVE (ref ?–25)
Tricyclic, Ur Screen: NEGATIVE (ref ?–1000)

## 2013-04-17 LAB — SALICYLATE LEVEL: Salicylates, Serum: 3 mg/dL — ABNORMAL HIGH

## 2013-04-17 LAB — CBC
HGB: 15.3 g/dL (ref 12.0–16.0)
RBC: 5.13 10*6/uL (ref 3.80–5.20)

## 2013-04-17 LAB — ACETAMINOPHEN LEVEL: Acetaminophen: <2 ug/mL

## 2013-05-15 DIAGNOSIS — F39 Unspecified mood [affective] disorder: Secondary | ICD-10-CM | POA: Diagnosis not present

## 2013-06-11 DIAGNOSIS — F329 Major depressive disorder, single episode, unspecified: Secondary | ICD-10-CM | POA: Diagnosis not present

## 2013-06-11 DIAGNOSIS — F411 Generalized anxiety disorder: Secondary | ICD-10-CM | POA: Diagnosis not present

## 2013-06-11 DIAGNOSIS — F3289 Other specified depressive episodes: Secondary | ICD-10-CM | POA: Diagnosis not present

## 2013-06-11 DIAGNOSIS — F39 Unspecified mood [affective] disorder: Secondary | ICD-10-CM | POA: Diagnosis not present

## 2013-06-25 DIAGNOSIS — I10 Essential (primary) hypertension: Secondary | ICD-10-CM | POA: Diagnosis not present

## 2013-06-25 DIAGNOSIS — Z9981 Dependence on supplemental oxygen: Secondary | ICD-10-CM | POA: Diagnosis not present

## 2013-06-25 DIAGNOSIS — M549 Dorsalgia, unspecified: Secondary | ICD-10-CM | POA: Diagnosis not present

## 2013-06-25 DIAGNOSIS — G8929 Other chronic pain: Secondary | ICD-10-CM | POA: Diagnosis not present

## 2013-07-22 DIAGNOSIS — M7710 Lateral epicondylitis, unspecified elbow: Secondary | ICD-10-CM | POA: Diagnosis not present

## 2013-07-22 DIAGNOSIS — M6580 Other synovitis and tenosynovitis, unspecified site: Secondary | ICD-10-CM | POA: Diagnosis not present

## 2013-07-26 DIAGNOSIS — K7689 Other specified diseases of liver: Secondary | ICD-10-CM | POA: Diagnosis not present

## 2013-07-26 DIAGNOSIS — IMO0001 Reserved for inherently not codable concepts without codable children: Secondary | ICD-10-CM | POA: Diagnosis not present

## 2013-07-26 DIAGNOSIS — G8929 Other chronic pain: Secondary | ICD-10-CM | POA: Diagnosis not present

## 2013-07-26 DIAGNOSIS — M549 Dorsalgia, unspecified: Secondary | ICD-10-CM | POA: Diagnosis not present

## 2013-07-26 DIAGNOSIS — R109 Unspecified abdominal pain: Secondary | ICD-10-CM | POA: Diagnosis not present

## 2013-07-31 DIAGNOSIS — F411 Generalized anxiety disorder: Secondary | ICD-10-CM | POA: Diagnosis not present

## 2013-07-31 DIAGNOSIS — F329 Major depressive disorder, single episode, unspecified: Secondary | ICD-10-CM | POA: Diagnosis not present

## 2013-07-31 DIAGNOSIS — F3289 Other specified depressive episodes: Secondary | ICD-10-CM | POA: Diagnosis not present

## 2013-08-21 DIAGNOSIS — J441 Chronic obstructive pulmonary disease with (acute) exacerbation: Secondary | ICD-10-CM | POA: Diagnosis not present

## 2013-09-17 DIAGNOSIS — L919 Hypertrophic disorder of the skin, unspecified: Secondary | ICD-10-CM | POA: Diagnosis not present

## 2013-09-17 DIAGNOSIS — L723 Sebaceous cyst: Secondary | ICD-10-CM | POA: Diagnosis not present

## 2013-09-17 DIAGNOSIS — L820 Inflamed seborrheic keratosis: Secondary | ICD-10-CM | POA: Diagnosis not present

## 2013-09-17 DIAGNOSIS — L909 Atrophic disorder of skin, unspecified: Secondary | ICD-10-CM | POA: Diagnosis not present

## 2013-09-23 DIAGNOSIS — M549 Dorsalgia, unspecified: Secondary | ICD-10-CM | POA: Diagnosis not present

## 2013-09-23 DIAGNOSIS — M793 Panniculitis, unspecified: Secondary | ICD-10-CM | POA: Diagnosis not present

## 2013-09-23 DIAGNOSIS — E785 Hyperlipidemia, unspecified: Secondary | ICD-10-CM | POA: Diagnosis not present

## 2013-09-23 DIAGNOSIS — I10 Essential (primary) hypertension: Secondary | ICD-10-CM | POA: Diagnosis not present

## 2013-09-23 DIAGNOSIS — E8881 Metabolic syndrome: Secondary | ICD-10-CM | POA: Diagnosis not present

## 2013-09-23 DIAGNOSIS — G8929 Other chronic pain: Secondary | ICD-10-CM | POA: Diagnosis not present

## 2013-09-23 DIAGNOSIS — R7309 Other abnormal glucose: Secondary | ICD-10-CM | POA: Diagnosis not present

## 2013-09-26 DIAGNOSIS — F329 Major depressive disorder, single episode, unspecified: Secondary | ICD-10-CM | POA: Diagnosis not present

## 2013-09-26 DIAGNOSIS — F3289 Other specified depressive episodes: Secondary | ICD-10-CM | POA: Diagnosis not present

## 2013-10-23 DIAGNOSIS — F329 Major depressive disorder, single episode, unspecified: Secondary | ICD-10-CM | POA: Diagnosis not present

## 2013-10-23 DIAGNOSIS — F3289 Other specified depressive episodes: Secondary | ICD-10-CM | POA: Diagnosis not present

## 2013-11-05 DIAGNOSIS — K5289 Other specified noninfective gastroenteritis and colitis: Secondary | ICD-10-CM | POA: Diagnosis not present

## 2013-11-05 DIAGNOSIS — R42 Dizziness and giddiness: Secondary | ICD-10-CM | POA: Diagnosis not present

## 2013-12-18 DIAGNOSIS — F3289 Other specified depressive episodes: Secondary | ICD-10-CM | POA: Diagnosis not present

## 2013-12-18 DIAGNOSIS — F329 Major depressive disorder, single episode, unspecified: Secondary | ICD-10-CM | POA: Diagnosis not present

## 2013-12-18 DIAGNOSIS — R634 Abnormal weight loss: Secondary | ICD-10-CM | POA: Diagnosis not present

## 2013-12-18 DIAGNOSIS — N390 Urinary tract infection, site not specified: Secondary | ICD-10-CM | POA: Diagnosis not present

## 2013-12-18 DIAGNOSIS — Z113 Encounter for screening for infections with a predominantly sexual mode of transmission: Secondary | ICD-10-CM | POA: Diagnosis not present

## 2013-12-18 DIAGNOSIS — I10 Essential (primary) hypertension: Secondary | ICD-10-CM | POA: Diagnosis not present

## 2013-12-18 DIAGNOSIS — M549 Dorsalgia, unspecified: Secondary | ICD-10-CM | POA: Diagnosis not present

## 2013-12-18 DIAGNOSIS — R300 Dysuria: Secondary | ICD-10-CM | POA: Diagnosis not present

## 2014-01-09 DIAGNOSIS — I10 Essential (primary) hypertension: Secondary | ICD-10-CM | POA: Diagnosis not present

## 2014-01-09 DIAGNOSIS — Z23 Encounter for immunization: Secondary | ICD-10-CM | POA: Diagnosis not present

## 2014-01-09 DIAGNOSIS — L509 Urticaria, unspecified: Secondary | ICD-10-CM | POA: Diagnosis not present

## 2014-01-23 ENCOUNTER — Ambulatory Visit: Payer: Self-pay | Admitting: Family Medicine

## 2014-01-23 DIAGNOSIS — Z1231 Encounter for screening mammogram for malignant neoplasm of breast: Secondary | ICD-10-CM | POA: Diagnosis not present

## 2014-01-23 DIAGNOSIS — R928 Other abnormal and inconclusive findings on diagnostic imaging of breast: Secondary | ICD-10-CM | POA: Diagnosis not present

## 2014-02-11 ENCOUNTER — Ambulatory Visit: Payer: Self-pay | Admitting: Family Medicine

## 2014-02-11 DIAGNOSIS — N6489 Other specified disorders of breast: Secondary | ICD-10-CM | POA: Diagnosis not present

## 2014-02-11 DIAGNOSIS — R922 Inconclusive mammogram: Secondary | ICD-10-CM | POA: Diagnosis not present

## 2014-02-12 DIAGNOSIS — Z79899 Other long term (current) drug therapy: Secondary | ICD-10-CM | POA: Diagnosis not present

## 2014-02-12 DIAGNOSIS — F329 Major depressive disorder, single episode, unspecified: Secondary | ICD-10-CM | POA: Diagnosis not present

## 2014-03-06 DIAGNOSIS — Z79899 Other long term (current) drug therapy: Secondary | ICD-10-CM | POA: Diagnosis not present

## 2014-03-06 DIAGNOSIS — F902 Attention-deficit hyperactivity disorder, combined type: Secondary | ICD-10-CM | POA: Diagnosis not present

## 2014-03-06 DIAGNOSIS — F3189 Other bipolar disorder: Secondary | ICD-10-CM | POA: Diagnosis not present

## 2014-03-06 DIAGNOSIS — F410 Panic disorder [episodic paroxysmal anxiety] without agoraphobia: Secondary | ICD-10-CM | POA: Diagnosis not present

## 2014-03-06 DIAGNOSIS — F4310 Post-traumatic stress disorder, unspecified: Secondary | ICD-10-CM | POA: Diagnosis not present

## 2014-03-18 DIAGNOSIS — R350 Frequency of micturition: Secondary | ICD-10-CM | POA: Diagnosis not present

## 2014-03-18 DIAGNOSIS — G8929 Other chronic pain: Secondary | ICD-10-CM | POA: Diagnosis not present

## 2014-03-18 DIAGNOSIS — Z6835 Body mass index (BMI) 35.0-35.9, adult: Secondary | ICD-10-CM | POA: Diagnosis not present

## 2014-03-18 DIAGNOSIS — K219 Gastro-esophageal reflux disease without esophagitis: Secondary | ICD-10-CM | POA: Diagnosis not present

## 2014-03-18 DIAGNOSIS — K5900 Constipation, unspecified: Secondary | ICD-10-CM | POA: Diagnosis not present

## 2014-03-18 DIAGNOSIS — M549 Dorsalgia, unspecified: Secondary | ICD-10-CM | POA: Diagnosis not present

## 2014-03-18 DIAGNOSIS — J449 Chronic obstructive pulmonary disease, unspecified: Secondary | ICD-10-CM | POA: Diagnosis not present

## 2014-03-28 DIAGNOSIS — T8484XA Pain due to internal orthopedic prosthetic devices, implants and grafts, initial encounter: Secondary | ICD-10-CM | POA: Diagnosis not present

## 2014-03-28 DIAGNOSIS — M25561 Pain in right knee: Secondary | ICD-10-CM | POA: Diagnosis not present

## 2014-03-28 DIAGNOSIS — M25562 Pain in left knee: Secondary | ICD-10-CM | POA: Diagnosis not present

## 2014-04-07 DIAGNOSIS — F902 Attention-deficit hyperactivity disorder, combined type: Secondary | ICD-10-CM | POA: Diagnosis not present

## 2014-04-07 DIAGNOSIS — F3132 Bipolar disorder, current episode depressed, moderate: Secondary | ICD-10-CM | POA: Diagnosis not present

## 2014-04-07 DIAGNOSIS — Z79899 Other long term (current) drug therapy: Secondary | ICD-10-CM | POA: Diagnosis not present

## 2014-04-07 DIAGNOSIS — F4310 Post-traumatic stress disorder, unspecified: Secondary | ICD-10-CM | POA: Diagnosis not present

## 2014-04-07 DIAGNOSIS — F3189 Other bipolar disorder: Secondary | ICD-10-CM | POA: Diagnosis not present

## 2014-04-14 DIAGNOSIS — T8484XD Pain due to internal orthopedic prosthetic devices, implants and grafts, subsequent encounter: Secondary | ICD-10-CM | POA: Diagnosis not present

## 2014-04-29 ENCOUNTER — Ambulatory Visit: Payer: Self-pay | Admitting: Orthopedic Surgery

## 2014-04-29 DIAGNOSIS — Z01810 Encounter for preprocedural cardiovascular examination: Secondary | ICD-10-CM | POA: Diagnosis not present

## 2014-04-29 DIAGNOSIS — I10 Essential (primary) hypertension: Secondary | ICD-10-CM | POA: Diagnosis not present

## 2014-04-29 DIAGNOSIS — Z01812 Encounter for preprocedural laboratory examination: Secondary | ICD-10-CM | POA: Diagnosis not present

## 2014-04-29 LAB — BASIC METABOLIC PANEL
ANION GAP: 5 — AB (ref 7–16)
BUN: 8 mg/dL (ref 7–18)
CALCIUM: 9.3 mg/dL (ref 8.5–10.1)
CHLORIDE: 104 mmol/L (ref 98–107)
CO2: 29 mmol/L (ref 21–32)
Creatinine: 0.61 mg/dL (ref 0.60–1.30)
EGFR (African American): >60
EGFR (Non-African Amer.): >60
GLUCOSE: 74 mg/dL (ref 65–99)
OSMOLALITY: 273 (ref 275–301)
POTASSIUM: 4.1 mmol/L (ref 3.5–5.1)
SODIUM: 138 mmol/L (ref 136–145)

## 2014-04-29 LAB — URINALYSIS, COMPLETE
BACTERIA: NONE SEEN
BLOOD: NEGATIVE
Bilirubin,UR: NEGATIVE
Glucose,UR: NEGATIVE mg/dL (ref 0–75)
Ketone: NEGATIVE
LEUKOCYTE ESTERASE: NEGATIVE
Nitrite: NEGATIVE
Ph: 5 (ref 4.5–8.0)
Protein: NEGATIVE
RBC, UR: NONE SEEN /HPF (ref 0–5)
Specific Gravity: 1.006 (ref 1.003–1.030)
WBC UR: NONE SEEN /HPF (ref 0–5)

## 2014-04-29 LAB — CBC
HCT: 44.6 % (ref 35.0–47.0)
HGB: 14.8 g/dL (ref 12.0–16.0)
MCH: 28.9 pg (ref 26.0–34.0)
MCHC: 33.1 g/dL (ref 32.0–36.0)
MCV: 87 fL (ref 80–100)
Platelet: 232 10*3/uL (ref 150–440)
RBC: 5.11 10*6/uL (ref 3.80–5.20)
RDW: 13.6 % (ref 11.5–14.5)
WBC: 8.5 10*3/uL (ref 3.6–11.0)

## 2014-04-29 LAB — APTT: ACTIVATED PTT: 30.3 s (ref 23.6–35.9)

## 2014-04-29 LAB — PROTIME-INR
INR: 1
PROTHROMBIN TIME: 13.3 s (ref 11.5–14.7)

## 2014-04-29 LAB — MRSA PCR SCREENING

## 2014-04-29 LAB — SEDIMENTATION RATE: Erythrocyte Sed Rate: 13 mm/hr (ref 0–20)

## 2014-04-30 DIAGNOSIS — L02212 Cutaneous abscess of back [any part, except buttock]: Secondary | ICD-10-CM | POA: Diagnosis not present

## 2014-05-06 ENCOUNTER — Inpatient Hospital Stay: Payer: Self-pay | Admitting: Orthopedic Surgery

## 2014-05-06 DIAGNOSIS — E119 Type 2 diabetes mellitus without complications: Secondary | ICD-10-CM | POA: Diagnosis present

## 2014-05-06 DIAGNOSIS — E785 Hyperlipidemia, unspecified: Secondary | ICD-10-CM | POA: Diagnosis present

## 2014-05-06 DIAGNOSIS — T8484XA Pain due to internal orthopedic prosthetic devices, implants and grafts, initial encounter: Secondary | ICD-10-CM | POA: Diagnosis not present

## 2014-05-06 DIAGNOSIS — Z96651 Presence of right artificial knee joint: Secondary | ICD-10-CM | POA: Diagnosis not present

## 2014-05-06 DIAGNOSIS — F329 Major depressive disorder, single episode, unspecified: Secondary | ICD-10-CM | POA: Diagnosis present

## 2014-05-06 DIAGNOSIS — J449 Chronic obstructive pulmonary disease, unspecified: Secondary | ICD-10-CM | POA: Diagnosis present

## 2014-05-06 DIAGNOSIS — T84022A Instability of internal right knee prosthesis, initial encounter: Secondary | ICD-10-CM | POA: Diagnosis not present

## 2014-05-06 DIAGNOSIS — M25361 Other instability, right knee: Secondary | ICD-10-CM | POA: Diagnosis present

## 2014-05-06 DIAGNOSIS — Z471 Aftercare following joint replacement surgery: Secondary | ICD-10-CM | POA: Diagnosis not present

## 2014-05-06 DIAGNOSIS — I10 Essential (primary) hypertension: Secondary | ICD-10-CM | POA: Diagnosis present

## 2014-05-07 LAB — BASIC METABOLIC PANEL
ANION GAP: 7 (ref 7–16)
BUN: 7 mg/dL (ref 7–18)
CALCIUM: 7.6 mg/dL — AB (ref 8.5–10.1)
Chloride: 100 mmol/L (ref 98–107)
Co2: 28 mmol/L (ref 21–32)
Creatinine: 0.58 mg/dL — ABNORMAL LOW (ref 0.60–1.30)
GLUCOSE: 101 mg/dL — AB (ref 65–99)
OSMOLALITY: 268 (ref 275–301)
POTASSIUM: 3.4 mmol/L — AB (ref 3.5–5.1)
SODIUM: 135 mmol/L — AB (ref 136–145)

## 2014-05-07 LAB — HEMOGLOBIN: HGB: 11.4 g/dL — ABNORMAL LOW (ref 12.0–16.0)

## 2014-05-07 LAB — PLATELET COUNT: PLATELETS: 181 10*3/uL (ref 150–440)

## 2014-05-08 LAB — BASIC METABOLIC PANEL
ANION GAP: 5 — AB (ref 7–16)
BUN: 6 mg/dL — ABNORMAL LOW (ref 7–18)
CALCIUM: 8.2 mg/dL — AB (ref 8.5–10.1)
CHLORIDE: 101 mmol/L (ref 98–107)
Co2: 32 mmol/L (ref 21–32)
Creatinine: 0.57 mg/dL — ABNORMAL LOW (ref 0.60–1.30)
EGFR (African American): >60
GLUCOSE: 112 mg/dL — AB (ref 65–99)
Osmolality: 274 (ref 275–301)
POTASSIUM: 3.8 mmol/L (ref 3.5–5.1)
SODIUM: 138 mmol/L (ref 136–145)

## 2014-05-08 LAB — HEMOGLOBIN: HGB: 11.6 g/dL — AB (ref 12.0–16.0)

## 2014-05-09 DIAGNOSIS — Z96651 Presence of right artificial knee joint: Secondary | ICD-10-CM | POA: Diagnosis not present

## 2014-05-09 DIAGNOSIS — F329 Major depressive disorder, single episode, unspecified: Secondary | ICD-10-CM | POA: Diagnosis not present

## 2014-05-09 DIAGNOSIS — E785 Hyperlipidemia, unspecified: Secondary | ICD-10-CM | POA: Diagnosis not present

## 2014-05-09 DIAGNOSIS — Z471 Aftercare following joint replacement surgery: Secondary | ICD-10-CM | POA: Diagnosis not present

## 2014-05-09 DIAGNOSIS — E119 Type 2 diabetes mellitus without complications: Secondary | ICD-10-CM | POA: Diagnosis not present

## 2014-05-09 DIAGNOSIS — J449 Chronic obstructive pulmonary disease, unspecified: Secondary | ICD-10-CM | POA: Diagnosis not present

## 2014-05-09 DIAGNOSIS — I10 Essential (primary) hypertension: Secondary | ICD-10-CM | POA: Diagnosis not present

## 2014-05-28 DIAGNOSIS — Z471 Aftercare following joint replacement surgery: Secondary | ICD-10-CM | POA: Diagnosis not present

## 2014-05-28 DIAGNOSIS — M549 Dorsalgia, unspecified: Secondary | ICD-10-CM | POA: Diagnosis not present

## 2014-05-28 DIAGNOSIS — G8929 Other chronic pain: Secondary | ICD-10-CM | POA: Diagnosis not present

## 2014-05-28 DIAGNOSIS — I10 Essential (primary) hypertension: Secondary | ICD-10-CM | POA: Diagnosis not present

## 2014-05-28 DIAGNOSIS — K219 Gastro-esophageal reflux disease without esophagitis: Secondary | ICD-10-CM | POA: Diagnosis not present

## 2014-05-28 DIAGNOSIS — E785 Hyperlipidemia, unspecified: Secondary | ICD-10-CM | POA: Diagnosis not present

## 2014-05-28 DIAGNOSIS — L509 Urticaria, unspecified: Secondary | ICD-10-CM | POA: Diagnosis not present

## 2014-05-28 DIAGNOSIS — F5104 Psychophysiologic insomnia: Secondary | ICD-10-CM | POA: Diagnosis not present

## 2014-05-28 DIAGNOSIS — Z96651 Presence of right artificial knee joint: Secondary | ICD-10-CM | POA: Diagnosis not present

## 2014-06-02 DIAGNOSIS — Z96651 Presence of right artificial knee joint: Secondary | ICD-10-CM | POA: Diagnosis not present

## 2014-06-04 DIAGNOSIS — F4310 Post-traumatic stress disorder, unspecified: Secondary | ICD-10-CM | POA: Diagnosis not present

## 2014-06-04 DIAGNOSIS — F902 Attention-deficit hyperactivity disorder, combined type: Secondary | ICD-10-CM | POA: Diagnosis not present

## 2014-06-04 DIAGNOSIS — F410 Panic disorder [episodic paroxysmal anxiety] without agoraphobia: Secondary | ICD-10-CM | POA: Diagnosis not present

## 2014-06-12 DIAGNOSIS — R42 Dizziness and giddiness: Secondary | ICD-10-CM | POA: Diagnosis not present

## 2014-06-12 DIAGNOSIS — K760 Fatty (change of) liver, not elsewhere classified: Secondary | ICD-10-CM | POA: Diagnosis not present

## 2014-06-12 DIAGNOSIS — E46 Unspecified protein-calorie malnutrition: Secondary | ICD-10-CM | POA: Diagnosis not present

## 2014-06-12 DIAGNOSIS — R739 Hyperglycemia, unspecified: Secondary | ICD-10-CM | POA: Diagnosis not present

## 2014-06-12 DIAGNOSIS — F319 Bipolar disorder, unspecified: Secondary | ICD-10-CM | POA: Diagnosis not present

## 2014-06-12 DIAGNOSIS — R160 Hepatomegaly, not elsewhere classified: Secondary | ICD-10-CM | POA: Diagnosis not present

## 2014-06-12 DIAGNOSIS — R634 Abnormal weight loss: Secondary | ICD-10-CM | POA: Diagnosis not present

## 2014-06-16 DIAGNOSIS — Z79899 Other long term (current) drug therapy: Secondary | ICD-10-CM | POA: Diagnosis not present

## 2014-06-16 DIAGNOSIS — F3132 Bipolar disorder, current episode depressed, moderate: Secondary | ICD-10-CM | POA: Diagnosis not present

## 2014-06-19 ENCOUNTER — Ambulatory Visit: Payer: Self-pay | Admitting: Family Medicine

## 2014-06-19 DIAGNOSIS — J9811 Atelectasis: Secondary | ICD-10-CM | POA: Diagnosis not present

## 2014-06-19 DIAGNOSIS — R634 Abnormal weight loss: Secondary | ICD-10-CM | POA: Diagnosis not present

## 2014-06-19 DIAGNOSIS — K760 Fatty (change of) liver, not elsewhere classified: Secondary | ICD-10-CM | POA: Diagnosis not present

## 2014-06-23 ENCOUNTER — Ambulatory Visit: Payer: Self-pay | Admitting: Orthopedic Surgery

## 2014-06-23 DIAGNOSIS — M79604 Pain in right leg: Secondary | ICD-10-CM | POA: Diagnosis not present

## 2014-06-23 DIAGNOSIS — R600 Localized edema: Secondary | ICD-10-CM | POA: Diagnosis not present

## 2014-07-30 DIAGNOSIS — N946 Dysmenorrhea, unspecified: Secondary | ICD-10-CM | POA: Diagnosis not present

## 2014-07-30 DIAGNOSIS — M549 Dorsalgia, unspecified: Secondary | ICD-10-CM | POA: Diagnosis not present

## 2014-07-30 DIAGNOSIS — G8929 Other chronic pain: Secondary | ICD-10-CM | POA: Diagnosis not present

## 2014-07-30 DIAGNOSIS — I10 Essential (primary) hypertension: Secondary | ICD-10-CM | POA: Diagnosis not present

## 2014-08-20 DIAGNOSIS — A499 Bacterial infection, unspecified: Secondary | ICD-10-CM | POA: Diagnosis not present

## 2014-08-20 DIAGNOSIS — N949 Unspecified condition associated with female genital organs and menstrual cycle: Secondary | ICD-10-CM | POA: Diagnosis not present

## 2014-08-20 DIAGNOSIS — N760 Acute vaginitis: Secondary | ICD-10-CM | POA: Diagnosis not present

## 2014-08-20 DIAGNOSIS — N946 Dysmenorrhea, unspecified: Secondary | ICD-10-CM | POA: Diagnosis not present

## 2014-08-26 NOTE — Op Note (Signed)
PATIENT NAME:  Wendy Johnson, Wendy Johnson MR#:  [id number] DATE OF BIRTH:  Sep 17, 1965  DATE OF PROCEDURE:  12/20/2011  PREOPERATIVE DIAGNOSIS: Laxity to right total knee.   POSTOPERATIVE DIAGNOSIS: Laxity to right total knee.   PROCEDURE: Revision polyethylene insert right total knee.   SURGEON: Laurene Footman, MD  ANESTHESIA: General.    DESCRIPTION OF PROCEDURE: Patient brought to the Operating Room and after adequate general anesthesia was obtained the leg was prepped and draped in the usual sterile fashion with tourniquet applied to the upper thigh and an Alvarado legholder utilized. After patient identification and timeout procedures were completed, the prior midline skin incision was made followed by a medial parapatellar arthrotomy. Inspection of the knee revealed normal-appearing joint fluid. The medial capsule was elevated slightly for exposure along with excision of scar behind the patellar tendon and the prior polyethylene component was removed without difficulty with use of an osteotome. Some scar posteriorly was removed at this point as well and the knee thoroughly irrigated. An 11 mm trial was placed and this gave good stability in mid flexion and it appeared to address the laxity to the knee in extension and mid flexion. The final component was then placed after again thoroughly irrigating out the knee. The knee was placed in 80 degrees flexion and the arthrotomy closed using a heavy quill suture, 2-0 quill subcutaneously and skin staples. 30 mL of 0.25% Sensorcaine with epinephrine was infiltrated along the medial arthrotomy. The wound was then dressed with Xeroform, 4 x 4's, ABD, Webril, and Ace wrap and the patient sent to recovery in stable condition.   ESTIMATED BLOOD LOSS: 75 mL.   COMPLICATIONS: None.   SPECIMEN: None. The removed implant did not have any abnormal wear.     CONDITION: To recovery room stable.   ____________________________ Laurene Footman,  MD mjm:cms D: 12/20/2011 10:46:37 ET T: 12/20/2011 11:27:34 ET JOB#: [id number]  cc: Laurene Footman, MD, <Dictator> Laurene Footman MD ELECTRONICALLY SIGNED 12/20/2011 13:07

## 2014-08-26 NOTE — Discharge Summary (Signed)
PATIENT NAME:  Wendy Johnson, Wendy Johnson MR#:  [id number] DATE OF BIRTH:  1965/07/15  DATE OF ADMISSION:  12/20/2011 DATE OF DISCHARGE:  12/22/2011  ADMITTING DIAGNOSIS: Laxity right total knee.   DISCHARGE DIAGNOSIS: Laxity right total knee.   PROCEDURE: Revision of polyethylene insert right total knee.   SURGEON: Laurene Footman, M.D.   ANESTHESIA: General.   HISTORY: The patient is a 49 year old female who after total knee replacement continued to have pain in the knee. The patient's pain progressively had gotten worse. She continued to have a clicking and catching sensation with walking. Pain had progressed. The patient agreed and consented to changing her polyethylene insert in her right knee.     PHYSICAL EXAMINATION: LUNGS: She is noted to have wheezing. HEART: Regular rate and rhythm. HEENT: Unremarkable. RIGHT KNEE:  She has 0 to 100 degrees range of motion varus and valgus instability with clicking and catching sensation.   HOSPITAL COURSE: The patient was admitted to the hospital on 12/20/2011. She had surgery that same day and was brought to the orthopedic floor from the PAC-U in stable condition. On postoperative day one the patient's vital signs as well as labs were monitored and everything was stable. She progressed slowly with physical therapy, but on 12/22/2011 the patient had progressed well with physical therapy. Vital signs were stable. Labs were stable. She was ready for discharge home with home physical therapy.   DISCHARGE INSTRUCTIONS:  1. She may gradually increase weight-bearing on the affected extremity. She is to elevate the affected foot/leg on 1 or 2 pillows with the foot higher than the knee. Knee-high TED hose on both legs need to be worn and removed at bedtime and replace on arising the next morning.  2. She may resume a regular diet.  3. She needs to take aspirin 325 mg once a day and resume typical home medications.  4. Pain medications consist of oxycodone 5 to 10 mg  every four hours as needed for pain and morphine sulfate 15 mg every 12 hours.  5. She is to apply an ice pack to the affected area. Continue using the Polar Care unit maintaining a temperature between 40 and 50 degrees.  6. Do not get the dressing or bandage wet or dirty. Call [facility] orth[facility]ics if the dressing gets water under it.  7. She is to call [facility] orthopedics if any of the following occur: Bright red bleeding from the incision wound, fever above 101.5 degrees, redness, swelling, or drainage at the incision.  8. She is to call [facility] orthopedics if she experiences any leg pain, numbness, or weakness in the legs or bowel or bladder symptoms.  9. She is referred to home physical therapy as well as Home Health. She needs to call [facility] orthopedics if a therapist has not contacted her within 48 hours.  10. She is a follow-up appointment with [facility] orthopedics in two weeks. She needs to call and make that appointment.   DISCHARGE MEDICATIONS:  1. Citalopram 20 mg oral tablet, 1 tablet orally once daily in the morning.  2. HCTZ 12.5 mg, 1 tablet orally once a day in the morning.  3. Clonazepam 1 mg oral tablet 1 tablet orally 5 times a day.  4. Simvastatin 40 mg 1 tablet orally once a day in the morning. 5. Trazodone 150 mg oral tablet extended release, 2 tabs orally once a day. 6. Lisinopril 40 mg oral tablet 1 tablet orally once a day in the  morning.  7. Abilify 15 mg 1 tablet orally once a day at bedtime.  8. Gabapentin 300 mg oral capsule, 4 tabs in the morning and 3 tabs in p.m.  9. TriCor 145 mg oral tablet 1 tablet orally once a day in the morning.  10. Diltiazem 120 mg 12 hours oral capsule extended release 1 tablet orally in the morning. Albuterol inhaler 2 puffs inhaled 4 times a day as needed.  11. Dexilant  60 mg oral delayed release capsule 1 tablet orally in the morning.  12. Spiriva 18 mcg inhalation capsule 1 puff inhaled daily at  lunch.   ____________________________ Duanne Guess, PA-C tcg:bjt D: 12/22/2011 08:26:16 ET T: 12/23/2011 10:25:35 ET JOB#: [id number]  Duanne Guess PA ELECTRONICALLY SIGNED 01/05/2012 6:46

## 2014-08-28 DIAGNOSIS — Z0000 Encounter for general adult medical examination without abnormal findings: Secondary | ICD-10-CM | POA: Diagnosis not present

## 2014-08-28 DIAGNOSIS — R6889 Other general symptoms and signs: Secondary | ICD-10-CM | POA: Diagnosis not present

## 2014-08-28 DIAGNOSIS — N946 Dysmenorrhea, unspecified: Secondary | ICD-10-CM | POA: Diagnosis not present

## 2014-08-28 DIAGNOSIS — Z1389 Encounter for screening for other disorder: Secondary | ICD-10-CM | POA: Diagnosis not present

## 2014-08-28 DIAGNOSIS — Z719 Counseling, unspecified: Secondary | ICD-10-CM | POA: Diagnosis not present

## 2014-08-29 DIAGNOSIS — N949 Unspecified condition associated with female genital organs and menstrual cycle: Secondary | ICD-10-CM | POA: Diagnosis not present

## 2014-08-29 NOTE — Consult Note (Signed)
PATIENT NAME:  Wendy Johnson, Wendy Johnson MR#:  [id number] DATE OF BIRTH:  [birth year]  DATE OF CONSULTATION:  04/17/2013  CONSULTING PHYSICIAN:  Gonzella Lex, MD  IDENTIFYING INFORMATION AND REASON FOR CONSULT: A 49 year old woman brought in to the Emergency Room with a report that she had taken an overdose intentionally. Consultation for psychiatric evaluation and involuntary commitment.   HISTORY OF PRESENT ILLNESS: Information obtained from the patient and the chart. The patient was petitioned in by her son who called law enforcement with the report that she had overdosed on her trazodone and had made suicidal statements. The patient tells me that she took two trazodone as she normally does and when she could not fall asleep she took two more and then another 1 or 2 after that in an attempt to fall asleep. She denied that she was having any suicidal ideation. She admitted that she had been intoxicated at the time and had had quite a bit of beer at that point. She said she had not been able to sleep for a few days and was just trying to get some rest. She denied that her mood was feeling any more sad or depressed than usual. She does say that she worries a great deal, has a lot of stress on her, most notably from her financial problems. She denies any psychotic symptoms. Denies any abuse of any other drugs currently.   PAST PSYCHIATRIC HISTORY: The patient gets follow-up by Dr. Leonides Schanz. He has a diagnosis of anxiety disorder. She also has a history of abuse of alcohol in the past. She sees a therapist as well. She has a past history of suicide attempts and aggressive behavior both years in the past. Has not had any inpatient psychiatric hospitalizations here. She feels like her current medication combination has been helpful for her.   SUBSTANCE ABUSE HISTORY: She says she drinks twice a week, but when she does drink she will drink 8 to 14 beers or the equivalent. She brags that this is an improvement over her  previous use which was when she was drinking every day. She has had two DWIs and has a court date coming up for one of them. She says she has been able to cut back on her drinking without much difficulty. Denies any history of DTs or seizures.   PAST MEDICAL HISTORY: The patient is overweight. She has a history of having had joint replacements. She has high blood pressure, dyslipidemia, COPD and gastric reflux symptoms.   SOCIAL HISTORY: The patient is on disability. She lives with her sister who is much younger than her and her own son. She has significant financial problems. Has to survive on her disability. She says they often do not have enough money to eat for the whole month.   CURRENT MEDICATIONS: Citalopram 20 mg per day, hydrochlorothiazide 12.5 mg per day, clonazepam 1 mg 5 times a day, simvastatin 40 mg once a day, trazodone 300 mg at night, lisinopril 40 mg a day, Abilify at 15 mg at night, gabapentin 1200 mg in the morning and 900 mg at night. TriCor 145 mg in the morning, diltiazem 120 mg extended-release once a day, albuterol inhaler as needed. Dexilant 60 mg once a day, Spiriva HandiHaler 18 mcg once a day.   ALLERGIES: CODEINE, DARVOCET, PERCOCET.   REVIEW OF SYSTEMS:  Currently denies depression. Denies suicidal or homicidal ideation. Denies hallucinations. Denies any specific physical symptoms. Not feeling shaky not sick to her stomach. No pain.  MENTAL STATUS EXAMINATION: Disheveled woman, looks her stated age, cooperative with the interview. Good eye contact. Normal psychomotor activity. Speech normal rate, tone and volume. Affect smiling reactive, upbeat, appropriate. Mood stated as fine. Thoughts are lucid. No evidence of loosening associations or delusions. Denies auditory or visual hallucinations. Denies suicidal or homicidal ideation. Shows adequate judgment and insight. Normal intelligence. Alert and oriented x 4.   LABORATORY RESULTS: Alcohol level when she presented early  this morning was 166. Drug screen positive for MDMA. Chemistry panel shows a low creatinine 0.53, elevated glucose 105. CBC unremarkable. Urinalysis normal.   VITAL SIGNS: Blood pressure 130/80, respirations 18, pulse 100 most recent temperature 97.3.   ASSESSMENT: This is a 49 year old woman with a history of mood instability. Currently taking medication and being seen at [facility]. Took an excessive amount of her trazodone but not enough to do her any lasting harm. The patient denies that there was any suicidal intent. She is currently showing a euthymic and upbeat affect and named several things in her life that are positive to live for. Does not appear to be hopeless. Agrees to outpatient treatment in the community. The patient admits that her drinking is probably excessive at times but does not need detox right now.   TREATMENT PLAN: No longer meets commitment criteria. She can come off of the involuntary commitment. Does not need inpatient hospitalization. The patient has been counseled about the dangers of using alcohol along with her Klonopin and other drugs and the dangers of impulsive overdoses. She is to continue current medication and will follow up with her outpatient provider Dr. Leonides Schanz and her therapist in the community.   DIAGNOSIS, PRINCIPAL AND PRIMARY:  AXIS I: Bipolar disorder type II.   SECONDARY DIAGNOSES: AXIS I: Alcohol abuse.  AXIS II: Deferred.  AXIS III: Obesity, hypertension, dyslipidemia, chronic obstructive pulmonary disease, gastric reflux.  AXIS IV: Moderate chronic stress from financial difficulties.  AXIS V: Functioning at time of evaluation 71.    ____________________________ Gonzella Lex, MD jtc:dp D: 04/17/2013 15:58:44 ET T: 04/17/2013 17:01:40 ET JOB#: [id number]  cc: Gonzella Lex, MD, <Dictator> Gonzella Lex MD ELECTRONICALLY SIGNED 04/17/2013 17:27

## 2014-08-29 NOTE — Consult Note (Signed)
Brief Consult Note: Diagnosis: depression nos.   Patient was seen by consultant.   Consult note dictated.   Discussed with Attending MD.   Comments: Psychiatry: PAtient seen. Chart reviewed. Note dictated. Patient denies any suicidal ideation and is upbeat and calm. Agrees to outpt treatment plan. No longer meets commitment criteria. Advise dc of IVC and release from ER.  Electronic Signatures: Rosie Golson, Madie Reno (MD)  (Signed 10-Dec-14 15:48)  Authored: Brief Consult Note   Last Updated: 10-Dec-14 15:48 by Gonzella Lex (MD)

## 2014-08-30 NOTE — Op Note (Signed)
PATIENT NAME:  Wendy Johnson, Wendy Johnson MR#:  [id number] DATE OF BIRTH:  04-11-1966  DATE OF PROCEDURE:  05/06/2014  PREOPERATIVE DIAGNOSIS: Unstable right total knee.   POSTOPERATIVE DIAGNOSIS: Unstable right total knee.  PROCEDURE: Revision of all femoral and tibial components, right total knee.   ANESTHESIA: Spinal.   SURGEON: Hessie Knows, MD   DESCRIPTION OF PROCEDURE: The patient was brought to the operating room and after  adequate anesthesia was obtained, the right leg was prepped and draped in the usual sterile fashion with a tourniquet applied to the upper thigh. After patient identification and timeout procedure completed, a midline skin incision was made, followed by a medial parapatellar arthrotomy. The tibial and femoral components were exposed and the prior tibial insert was removed. Using thin flexible and standard osteotomes, the femoral component was removed with very minimal bone loss, followed by removal of the tibial component, again with minimal bone loss. The tibia was addressed first with first drilling a starter hole, sequentially hand reaming and then making a proximal tibia cut based off the hand reamer that was placed down the canal to get appropriate alignment. The proximal tibia cut was made and fresh bone was noted. The tibia was sized to a size 2.5. Proximal reaming carried out followed by the broaching for the sleeve and keel punch. These components were left in place. The femur was approached, again, first drilling distally, placing reaming by hand and then doing a distal femoral cut based on the hand reaming. The femur sized to a size 3. Anterior cut was carried out followed by posterior and chamfer cuts with 4 mm resections posteriorly, followed by the box cut. After the box cut was complete, broaching was carried out for the sleeve, and based on this trial components were placed with a 4 mm distal augment and 4 mm posterior augment. A 10 mm insert gave full extension and  excellent stability as well as normal patellofemoral tracking. The patella was examined at this point and the patella from the old Stryker triathlon knee appeared to track well and the polyethylene itself appeared to be intact without any evidence of loosening. The trial components were removed and the tourniquet was raised, after infiltration of dilute Exparel and 0.25% Sensorcaine with epinephrine in the periarticular tissues. The tourniquet was then raised and the bony surfaces thoroughly irrigated and dried. Following this, the components were assembled on the back table, and when they were assembled the tibial and femoral components were inserted with a small amount of cement on the bony surface with the bony ingrowth portion being left free of any bone cement. A 10 mm trial was placed and knee was held in extension as the cement set. Excess cement was then removed. The stability felt appropriate and there was not undue tension on the soft tissue, so a 10 final component was placed after thoroughly irrigating out the joint. The arthrotomy was then repaired using 0 Ethibond for the capsule followed by a heavy Quill, 2-0 Quill subcutaneously and staples. Xeroform, 4 x 4's, Webril and Ace wrap along with the Polar Care unit were placed. The patient was sent to the recovery room in stable condition.   ESTIMATED BLOOD LOSS: 450.   COMPLICATIONS: None.   SPECIMEN: None.   TOURNIQUET TIME: Fifty-nine minutes at 300 mmHg.  IMPLANTS: DePuy TC3 components, a Sigma femoral size right 3 and a tibia tray size 2.5 MBT rotating platform revision tray. The femur had posterior and distal augments medial and lateral 4  mm, with a femoral sleeve 31 mm, adapter and a 75 x 16 mm fluted stem. The tibia had a 75 x 12 mm stem attached to a 29 mm metaphyseal sleeve with a size three 10 mm rotating platform the TC3. All components were cemented. There were no complications.  CONDITION: To recovery room  stable.   ____________________________ Laurene Footman, MD mjm:TT D: 05/06/2014 19:48:33 ET T: 05/06/2014 21:33:53 ET JOB#: [id number]  cc: Laurene Footman, MD, <Dictator> Laurene Footman MD ELECTRONICALLY SIGNED 05/07/2014 8:36

## 2014-09-03 DIAGNOSIS — F3132 Bipolar disorder, current episode depressed, moderate: Secondary | ICD-10-CM | POA: Diagnosis not present

## 2014-09-03 DIAGNOSIS — F410 Panic disorder [episodic paroxysmal anxiety] without agoraphobia: Secondary | ICD-10-CM | POA: Diagnosis not present

## 2014-09-03 DIAGNOSIS — F901 Attention-deficit hyperactivity disorder, predominantly hyperactive type: Secondary | ICD-10-CM | POA: Diagnosis not present

## 2014-09-03 DIAGNOSIS — F4310 Post-traumatic stress disorder, unspecified: Secondary | ICD-10-CM | POA: Diagnosis not present

## 2014-09-03 DIAGNOSIS — F3189 Other bipolar disorder: Secondary | ICD-10-CM | POA: Diagnosis not present

## 2014-09-03 DIAGNOSIS — Z79899 Other long term (current) drug therapy: Secondary | ICD-10-CM | POA: Diagnosis not present

## 2014-09-03 NOTE — Discharge Summary (Signed)
PATIENT NAME:  Wendy Johnson, Wendy Johnson MR#:  [id number] DATE OF BIRTH:  [birth year]  DATE OF ADMISSION:  05/06/2014 DATE OF DISCHARGE:  05/08/2014  ADMITTING DIAGNOSIS: Unstable right total knee arthroplasty.   DISCHARGE DIAGNOSIS: Unstable right total knee arthroplasty.   HISTORY: The patient is a 49 year old female who has been followed at [facility] for progression of discomfort to the right knee. She had previously undergone a total knee arthroplasty in 2011. Following that, she was noted to have some instability and subsequently polyethylene spacer was exchanged. She then had a second polyethylene exchange for instability. Due to the continuation of discomfort, she was brought back to the operating room for revision. The patient states that her symptoms began gradually and continued to intensify.  She reported no injuries or any change of activities. Her pain level prior to surgery was 6/10. She complained of pain both to medial lateral aspect the knee. She had found that her symptoms were  aggravated with normal daily activities such as bending, using stairs as well as rising from a chair or walking and standing. X-rays taken in [place] showed good alignment of the prosthesis. There is no evidence of any loosening. After discussion of the risks, benefits of surgical intervention, the patient expressed her understanding of risks and benefits and agreed for plans for surgical intervention.   PROCEDURE: Revision of all femoral and tibial components with polyethylene exchange.   ANESTHESIA: Spinal.   IMPLANTS UTILIZED: DePuy TC3 components, a Sigma femoral size right 3 and tibial tray size 2.5 MBT rotating platform polyethylene revision tray. The femur had posterior and distal augments both to the medial and lateral compartments of 4 mm. The femoral sleeve of 31 mm was used to as well as adapter and 75 x 16 mm fluted stem. The tibia had a 75 x 12 mm stem attached to a 29 mm  metaphyseal sleeve with a size 3, 10 mm rotating platform TC3.   HOSPITAL COURSE: The patient tolerated the procedure very well. She had no complications. She was then taken to the PAC-U where she was stabilized and then transferred to the orthopedic floor. The patient began receiving anticoagulation therapy of Lovenox 30 mg subcutaneous every 12 hours per anesthesia and pharmacy protocol. She was fitted with TED stockings bilaterally. These were allowed to be removed 1 hour per 8 hour shift. The patient was also fitted with AVI compression foot pumps set at 80 mmHg bilaterally. Calves have been nontender. There has been no evidence of any DVTs. Negative Homans sign. Heels were elevated off the bed using rolled towels.   The patient has denied any chest pain or shortness of breath. Vital signs have been stable. She has been afebrile. Hemodynamically she was stable. No transfusions were given.   The patient's IV and Foley were discontinued on day 2. The dressing was changed on day 2 as well. The wound was free of any drainage or signs of infection. Polar Care was reapplied to the surgical leg, maintaining a temperature of 40-50 degrees Fahrenheit.   DISPOSITION: The patient is being discharged to home in improved stable condition.   DISCHARGE INSTRUCTIONS: She may continue to weight bear as tolerated. Continue using a rolling walker until cleared by physical therapy to go to a quad cane. She will receive home health PT. She is placed on an ADA diet. She is to continue with TED stockings bilaterally. These are allowed to be removed at night but are to be worn during going the  day. Continue using Polar Care maintaining a temperature of 40-50 degrees Fahrenheit. For the first 2 weeks recommend that she wear the Polar Care around-the-clock as much as possible. She was instructed on wound care. Elevate the heels off the bed. Continue using incentive spirometer. Also encouraged the patient to continue cough and  deep breathing q. 2 hours while awake.   She may resume her regular medication that she was on prior to admission. She was given a prescription for Roxicodone 5-10 mg every 4-6 hours p.r.n. for pain, tramadol 50-100 mg every 4-6 hours p.r.n. for pain, and Lovenox 40 mg subcutaneously daily for 14 days, then discontinue and begin taking  one 81 mg enteric-coated aspirin.   PAST MEDICAL HISTORY: Diabetes mellitus, hyperlipidemia, hypertension, depression, COPD.    ____________________________ Vance Peper, PA jrw:bm D: 05/08/2014 06:43:15 ET T: 05/08/2014 07:35:28 ET JOB#: [id number]  cc: Vance Peper, PA, <Dictator> JON WOLFE PA ELECTRONICALLY SIGNED 05/13/2014 13:49

## 2014-09-17 ENCOUNTER — Encounter
Admission: RE | Admit: 2014-09-17 | Discharge: 2014-09-17 | Disposition: A | Discharge status: Home / Self Care | Payer: Medicare Other | Source: Ambulatory Visit | Attending: Obstetrics and Gynecology | Admitting: Obstetrics and Gynecology

## 2014-09-17 DIAGNOSIS — M25551 Pain in right hip: Secondary | ICD-10-CM | POA: Diagnosis not present

## 2014-09-17 DIAGNOSIS — Z01812 Encounter for preprocedural laboratory examination: Secondary | ICD-10-CM | POA: Diagnosis not present

## 2014-09-17 DIAGNOSIS — N760 Acute vaginitis: Secondary | ICD-10-CM | POA: Diagnosis not present

## 2014-09-17 DIAGNOSIS — N946 Dysmenorrhea, unspecified: Secondary | ICD-10-CM | POA: Insufficient documentation

## 2014-09-17 DIAGNOSIS — N949 Unspecified condition associated with female genital organs and menstrual cycle: Secondary | ICD-10-CM | POA: Insufficient documentation

## 2014-09-17 DIAGNOSIS — T8484XD Pain due to internal orthopedic prosthetic devices, implants and grafts, subsequent encounter: Secondary | ICD-10-CM | POA: Diagnosis not present

## 2014-09-17 DIAGNOSIS — A499 Bacterial infection, unspecified: Secondary | ICD-10-CM | POA: Diagnosis not present

## 2014-09-17 HISTORY — DX: Gastro-esophageal reflux disease without esophagitis: K21.9

## 2014-09-17 HISTORY — DX: Pure hypercholesterolemia, unspecified: E78.00

## 2014-09-17 LAB — CBC
HCT: 41.5 % (ref 35.0–47.0)
Hemoglobin: 13.8 g/dL (ref 12.0–16.0)
MCH: 28.5 pg (ref 26.0–34.0)
MCHC: 33.3 g/dL (ref 32.0–36.0)
MCV: 85.5 fL (ref 80.0–100.0)
PLATELETS: 195 10*3/uL (ref 150–440)
RBC: 4.85 MIL/uL (ref 3.80–5.20)
RDW: 15.1 % — AB (ref 11.5–14.5)
WBC: 7 10*3/uL (ref 3.6–11.0)

## 2014-09-17 NOTE — Patient Instructions (Signed)
  Your procedure is scheduled on: Monday May 16 Report to Day Surgery. To find out your arrival time please call [phone] between 1PM - 3PM on Friday.May 13  Remember: Instructions that are not followed completely may result in serious medical risk, up to and including death, or upon the discretion of your surgeon and anesthesiologist your surgery may need to be rescheduled.    __x__ 1. Do not eat food or drink liquids after midnight. No gum chewing or hard candies.     __x__ 2. No Alcohol for 24 hours before or after surgery.   ____ 3. Bring all medications with you on the day of surgery if instructed.    __x__ 4. Notify your doctor if there is any change in your medical condition     (cold, fever, infections).     Do not wear jewelry, make-up, hairpins, clips or nail polish.  Do not wear lotions, powders, or perfumes. You may wear deodorant.  Do not shave 48 hours prior to surgery. Men may shave face and neck.  Do not bring valuables to the hospital.    [facility] is not responsible for any belongings or valuables.               Contacts, dentures or bridgework may not be worn into surgery.  Leave your suitcase in the car. After surgery it may be brought to your room.  For patients admitted to the hospital, discharge time is determined by your                treatment team.   Patients discharged the day of surgery will not be allowed to drive home.   Please read over the following fact sheets that you were given:   Surgical Site Infection Prevention   __x__ Take these medicines the morning of surgery with A SIP OF WATER:    1. aripiprazole  2. Clonazepam   3. Sertraline   4. dexilant   5. buspirone  6.  ____ Fleet Enema (as directed)   __x__ Use CHG Soap as directed  __x__ Use inhalers on the day of surgery and bring to hospital  ____ Stop metformin 2 days prior to surgery    ____ Take 1/2 of usual insulin dose the night before surgery and none on the morning  of surgery.   ____ Stop Coumadin/Plavix/aspirin on   __x__ Stop Anti-inflammatories on Today (ibuprofen)   ____ Stop supplements until after surgery.    ____ Bring C-Pap to the hospital.

## 2014-09-18 LAB — POTASSIUM: Potassium: 3.9 mmol/L (ref 3.5–5.1)

## 2014-09-19 ENCOUNTER — Other Ambulatory Visit: Payer: Self-pay | Admitting: Orthopedic Surgery

## 2014-09-19 DIAGNOSIS — M25551 Pain in right hip: Secondary | ICD-10-CM

## 2014-09-22 ENCOUNTER — Encounter
Admission: RE | Disposition: A | Discharge status: Home / Self Care | Payer: Self-pay | Source: Ambulatory Visit | Attending: Obstetrics and Gynecology

## 2014-09-22 ENCOUNTER — Encounter: Payer: Self-pay | Admitting: *Deleted

## 2014-09-22 ENCOUNTER — Ambulatory Visit
Admission: RE | Admit: 2014-09-22 | Discharge: 2014-09-22 | Disposition: A | Discharge status: Home / Self Care | Payer: Medicare Other | Source: Ambulatory Visit | Attending: Obstetrics and Gynecology | Admitting: Obstetrics and Gynecology

## 2014-09-22 ENCOUNTER — Ambulatory Visit: Payer: Medicare Other | Admitting: Anesthesiology

## 2014-09-22 DIAGNOSIS — Z833 Family history of diabetes mellitus: Secondary | ICD-10-CM | POA: Diagnosis not present

## 2014-09-22 DIAGNOSIS — E785 Hyperlipidemia, unspecified: Secondary | ICD-10-CM | POA: Insufficient documentation

## 2014-09-22 DIAGNOSIS — M7071 Other bursitis of hip, right hip: Secondary | ICD-10-CM | POA: Diagnosis not present

## 2014-09-22 DIAGNOSIS — G43909 Migraine, unspecified, not intractable, without status migrainosus: Secondary | ICD-10-CM | POA: Diagnosis not present

## 2014-09-22 DIAGNOSIS — E119 Type 2 diabetes mellitus without complications: Secondary | ICD-10-CM | POA: Diagnosis not present

## 2014-09-22 DIAGNOSIS — F319 Bipolar disorder, unspecified: Secondary | ICD-10-CM | POA: Diagnosis not present

## 2014-09-22 DIAGNOSIS — E669 Obesity, unspecified: Secondary | ICD-10-CM | POA: Diagnosis not present

## 2014-09-22 DIAGNOSIS — Z800 Family history of malignant neoplasm of digestive organs: Secondary | ICD-10-CM | POA: Diagnosis not present

## 2014-09-22 DIAGNOSIS — N9489 Other specified conditions associated with female genital organs and menstrual cycle: Secondary | ICD-10-CM | POA: Diagnosis not present

## 2014-09-22 DIAGNOSIS — N803 Endometriosis of pelvic peritoneum: Secondary | ICD-10-CM | POA: Insufficient documentation

## 2014-09-22 DIAGNOSIS — E8881 Metabolic syndrome: Secondary | ICD-10-CM | POA: Diagnosis not present

## 2014-09-22 DIAGNOSIS — F17200 Nicotine dependence, unspecified, uncomplicated: Secondary | ICD-10-CM | POA: Diagnosis not present

## 2014-09-22 DIAGNOSIS — K760 Fatty (change of) liver, not elsewhere classified: Secondary | ICD-10-CM | POA: Diagnosis not present

## 2014-09-22 DIAGNOSIS — Z8249 Family history of ischemic heart disease and other diseases of the circulatory system: Secondary | ICD-10-CM | POA: Insufficient documentation

## 2014-09-22 DIAGNOSIS — F5104 Psychophysiologic insomnia: Secondary | ICD-10-CM | POA: Diagnosis not present

## 2014-09-22 DIAGNOSIS — Z96651 Presence of right artificial knee joint: Secondary | ICD-10-CM | POA: Diagnosis not present

## 2014-09-22 DIAGNOSIS — J449 Chronic obstructive pulmonary disease, unspecified: Secondary | ICD-10-CM | POA: Insufficient documentation

## 2014-09-22 DIAGNOSIS — Z888 Allergy status to other drugs, medicaments and biological substances status: Secondary | ICD-10-CM | POA: Insufficient documentation

## 2014-09-22 DIAGNOSIS — K5900 Constipation, unspecified: Secondary | ICD-10-CM | POA: Diagnosis not present

## 2014-09-22 DIAGNOSIS — Z79899 Other long term (current) drug therapy: Secondary | ICD-10-CM | POA: Insufficient documentation

## 2014-09-22 DIAGNOSIS — R102 Pelvic and perineal pain: Secondary | ICD-10-CM | POA: Diagnosis not present

## 2014-09-22 DIAGNOSIS — N809 Endometriosis, unspecified: Secondary | ICD-10-CM | POA: Diagnosis not present

## 2014-09-22 DIAGNOSIS — N946 Dysmenorrhea, unspecified: Secondary | ICD-10-CM | POA: Insufficient documentation

## 2014-09-22 DIAGNOSIS — N736 Female pelvic peritoneal adhesions (postinfective): Secondary | ICD-10-CM | POA: Diagnosis not present

## 2014-09-22 HISTORY — PX: LAPAROSCOPY: SHX197

## 2014-09-22 LAB — POCT PREGNANCY, URINE: Preg Test, Ur: NEGATIVE

## 2014-09-22 SURGERY — LAPAROSCOPY OPERATIVE
Anesthesia: General | Wound class: Clean Contaminated

## 2014-09-22 MED ORDER — LACTATED RINGERS IV SOLN
INTRAVENOUS | Status: DC
  Administered 2014-09-22 (×2): via INTRAVENOUS

## 2014-09-22 MED ORDER — OXYCODONE-ACETAMINOPHEN 5-325 MG PO TABS: 1.0000 | ORAL_TABLET | ORAL | Status: DC | PRN

## 2014-09-22 MED ORDER — FENTANYL CITRATE (PF) 100 MCG/2ML IJ SOLN
INTRAMUSCULAR | Status: AC
  Filled 2014-09-22: qty 2

## 2014-09-22 MED ORDER — MIDAZOLAM HCL 2 MG/2ML IJ SOLN
INTRAMUSCULAR | Status: DC | PRN
  Administered 2014-09-22: 2 mg via INTRAVENOUS

## 2014-09-22 MED ORDER — NEOSTIGMINE METHYLSULFATE 10 MG/10ML IV SOLN
INTRAVENOUS | Status: DC | PRN
  Administered 2014-09-22: 3 mg via INTRAVENOUS

## 2014-09-22 MED ORDER — FENTANYL CITRATE (PF) 100 MCG/2ML IJ SOLN
25.0000 ug | INTRAMUSCULAR | Status: DC | PRN
  Administered 2014-09-22 (×4): 25 ug via INTRAVENOUS

## 2014-09-22 MED ORDER — ONDANSETRON HCL 4 MG/2ML IJ SOLN
INTRAMUSCULAR | Status: DC | PRN
  Administered 2014-09-22: 4 mg via INTRAVENOUS

## 2014-09-22 MED ORDER — LIDOCAINE HCL (CARDIAC) 20 MG/ML IV SOLN
INTRAVENOUS | Status: DC | PRN
  Administered 2014-09-22: 60 mg via INTRAVENOUS

## 2014-09-22 MED ORDER — IBUPROFEN 800 MG PO TABS: 800.0000 mg | ORAL_TABLET | Freq: Three times a day (TID) | ORAL | Status: DC

## 2014-09-22 MED ORDER — KETOROLAC TROMETHAMINE 30 MG/ML IJ SOLN
INTRAMUSCULAR | Status: DC | PRN
  Administered 2014-09-22: 30 mg via INTRAVENOUS

## 2014-09-22 MED ORDER — FENTANYL CITRATE (PF) 100 MCG/2ML IJ SOLN
INTRAMUSCULAR | Status: DC | PRN
Start: 2014-09-22 — End: 2014-09-22
  Administered 2014-09-22 (×2): 50 ug via INTRAVENOUS
  Administered 2014-09-22: 100 ug via INTRAVENOUS
  Administered 2014-09-22: 50 ug via INTRAVENOUS

## 2014-09-22 MED ORDER — ONDANSETRON HCL 4 MG/2ML IJ SOLN: 4.0000 mg | Freq: Once | INTRAMUSCULAR | Status: DC | PRN

## 2014-09-22 MED ORDER — PROPOFOL 10 MG/ML IV BOLUS
INTRAVENOUS | Status: DC | PRN
  Administered 2014-09-22: 150 mg via INTRAVENOUS

## 2014-09-22 MED ORDER — ROCURONIUM BROMIDE 100 MG/10ML IV SOLN
INTRAVENOUS | Status: DC | PRN
  Administered 2014-09-22: 25 mg via INTRAVENOUS
  Administered 2014-09-22: 15 mg via INTRAVENOUS

## 2014-09-22 MED ORDER — HYDROMORPHONE HCL 1 MG/ML IJ SOLN: 0.2500 mg | INTRAMUSCULAR | Status: DC | PRN

## 2014-09-22 MED ORDER — DEXAMETHASONE SODIUM PHOSPHATE 4 MG/ML IJ SOLN
INTRAMUSCULAR | Status: DC | PRN
  Administered 2014-09-22: 5 mg via INTRAVENOUS

## 2014-09-22 MED ORDER — GLYCOPYRROLATE 0.2 MG/ML IJ SOLN
INTRAMUSCULAR | Status: DC | PRN
  Administered 2014-09-22: 0.6 mg via INTRAVENOUS

## 2014-09-22 SURGICAL SUPPLY — 32 items
BLADE SURG SZ11 CARB STEEL (BLADE) ×3 IMPLANT
BNDG ADH 2 X3.75 FABRIC TAN LF (GAUZE/BANDAGES/DRESSINGS) ×9 IMPLANT
CANISTER SUCT 1200ML W/VALVE (MISCELLANEOUS) IMPLANT
CATH ROBINSON RED A/P 16FR (CATHETERS) ×3 IMPLANT
CHLORAPREP W/TINT 26ML (MISCELLANEOUS) ×3 IMPLANT
DERMABOND ADVANCED (GAUZE/BANDAGES/DRESSINGS) ×1
DERMABOND ADVANCED .7 DNX12 (GAUZE/BANDAGES/DRESSINGS) ×2 IMPLANT
GLOVE BIO SURGEON STRL SZ8 (GLOVE) ×9 IMPLANT
GLOVE INDICATOR 8.0 STRL GRN (GLOVE) ×6 IMPLANT
GOWN STRL REUS W/ TWL LRG LVL3 (GOWN DISPOSABLE) ×4 IMPLANT
GOWN STRL REUS W/ TWL XL LVL3 (GOWN DISPOSABLE) ×2 IMPLANT
GOWN STRL REUS W/TWL LRG LVL3 (GOWN DISPOSABLE) ×2
GOWN STRL REUS W/TWL XL LVL3 (GOWN DISPOSABLE) ×1
IRRIGATION STRYKERFLOW (MISCELLANEOUS) ×2 IMPLANT
IRRIGATOR STRYKERFLOW (MISCELLANEOUS) ×3
IV LACTATED RINGERS 1000ML (IV SOLUTION) ×3 IMPLANT
KIT RM TURNOVER CYSTO AR (KITS) ×3 IMPLANT
LABEL OR SOLS (LABEL) IMPLANT
NS IRRIG 1000ML POUR BTL (IV SOLUTION) ×3 IMPLANT
NS IRRIG 500ML POUR BTL (IV SOLUTION) ×3 IMPLANT
PACK GYN LAPAROSCOPIC (MISCELLANEOUS) ×3 IMPLANT
PAD OB MATERNITY 4.3X12.25 (PERSONAL CARE ITEMS) ×3 IMPLANT
PAD PREP 24X41 OB/GYN DISP (PERSONAL CARE ITEMS) ×3 IMPLANT
POUCH ENDO CATCH 10MM SPEC (MISCELLANEOUS) IMPLANT
SCISSORS METZENBAUM CVD 33 (INSTRUMENTS) ×3 IMPLANT
SLEEVE ENDOPATH XCEL 5M (ENDOMECHANICALS) ×3 IMPLANT
SUT PLAIN 4 0 FS 2 27 (SUTURE) ×9 IMPLANT
SUT VIC AB 0 CT2 27 (SUTURE) ×3 IMPLANT
SUT VIC AB 0 UR5 27 (SUTURE) ×3 IMPLANT
TROCAR 5M 150ML BLDLS (TROCAR) ×3 IMPLANT
TROCAR XCEL NON-BLD 5MMX100MML (ENDOMECHANICALS) ×3 IMPLANT
TUBING INSUFFLATOR HI FLOW (MISCELLANEOUS) ×3 IMPLANT

## 2014-09-22 NOTE — Op Note (Addendum)
Wendy Johnson PROCEDURE DATE: 09/22/2014 1:15 PM  PREOPERATIVE DIAGNOSIS: dysmenorrhea,pelvic pain POSTOPERATIVE DIAGNOSIS: SAA and Endometriosis, and PAD PROCEDURE: Procedure(s) with comments: LAPAROSCOPY OPERATIVE - excision and fulgeration of endomertriosis SURGEON:  Dr. Enzo Bi, Ashante Snelling ASSISTANT: none  INDICATIONS: 49 y.o. No obstetric history on file. with history of chronic pelvic pain concerning for endometriosis desiring surgical evaluation.   Please see preoperative notes for further details.  Risks of surgery were discussed with the patient including but not limited to: bleeding which may require transfusion or reoperation; infection which may require antibiotics; injury to bowel, bladder, ureters or other surrounding organs; need for additional procedures including laparotomy; thromboembolic phenomenon, incisional problems and other postoperative/anesthesia complications. Written informed consent was obtained.    FINDINGS:  Small uterus, normal ovaries and fallopian tubes bilaterally.  There is diffuse evidence of red-colored endometriosis lesions in the posterior cul-de-sac, and thin adhesions which were lysed.  Peritoneal biopsies were taken and sent to pathology. No other abdominal/pelvic abnormality.  Normal upper abdomen.  ANESTHESIA:    General I/O's: Total I/O In: 1000 [I.V.:1000] Out: 62 [Urine:50; Blood:15] SPECIMENS: Peritoneal biopsies (cyul de sac central, cul de sac right, rt ovarian fossa, lt ovarian fossa) COMPLICATIONS: None immediate  PROCEDURE IN DETAIL:  The patient was brought to the operating room placed in supine position. General endotracheal anesthesia was induced that difficulty. She was placed in the dorsolithotomy position using B strep. A Betadine abdominal perineal intravaginal prep and drape. Bimanual exam was performed. Single-tooth tenaculum was placed to improve the cervix.  Hulka tenaculum placed on the cervix to facilitate uterine  manipulation. Subumbilical vertical incision was. The Optiview laparoscopic trocar system was used to place a 5 mm port into the abdominal pelvic cavity. The incision had to be extended because of difficulty placing the 5 mm trocar. Fascia was grasped with Coker clamps and the peritoneum was entered directly. No bowel or vascular injury was encountered. 2 other 5 mm ports were placed in the abdomen in the right lower quadrant under direct visualization. The findings of endometriosis implants in the cul-de-sac and in the right and left ovarian fossa were photo documented. These were then excised with biopsy forceps and the cul-de-sac lesions were cauterized using Kleppinger bipolar forceps. Good hemostasis was obtained. Following complete survey of the abdomen which included visualization of a normal  appendix and gallbladder/upper abdomen, normal ovaries bilaterally, fallopian tubes with evidence of prior tubal ligation the procedure was terminated with all instrumentation being removed from the abdominal pelvic cavity. Pneumoperitoneum was released. Incisions were closed with 0 Vicryl subumbilical fascia in a figure-of-eight manner. The skin was closed with simple interrupted sutures of 4-0 Vicryl. Dermabond glue was placed over the incisions. Patient was then awakened mobilized and taken in satisfactory condition.   Wendy Johnson, [name], MD  EBL:  Total I/O In: 1000 [I.V.:1000] Out: 78 [Urine:50; Blood:15]  BLOOD ADMINISTERED:none  DRAINS: none   LOCAL MEDICATIONS USED:  NONE  DISPOSITION OF SPECIMEN:  PATHOLOGY  COUNTS:  YES     PLAN OF CARE: Discharge to home after PACU  PATIENT DISPOSITION:  Short Stay

## 2014-09-22 NOTE — Discharge Instructions (Addendum)
General Anesthesia, Care After Refer to this sheet in the next few weeks. These instructions provide you with information on caring for yourself after your procedure. Your health care provider may also give you more specific instructions. Your treatment has been planned according to current medical practices, but problems sometimes occur. Call your health care provider if you have any problems or questions after your procedure. WHAT TO EXPECT AFTER THE PROCEDURE After the procedure, it is typical to experience: Sleepiness. Nausea and vomiting. HOME CARE INSTRUCTIONS For the first 24 hours after general anesthesia: Have a responsible person with you. Do not drive a car. If you are alone, do not take public transportation. Do not drink alcohol. Do not take medicine that has not been prescribed by your health care provider. Do not sign important papers or make important decisions. You may resume a normal diet and activities as directed by your health care provider. Change bandages (dressings) as directed. If you have questions or problems that seem related to general anesthesia, call the hospital and ask for the anesthetist or anesthesiologist on call. SEEK MEDICAL CARE IF: You have nausea and vomiting that continue the day after anesthesia. You develop a rash. SEEK IMMEDIATE MEDICAL CARE IF:  You have difficulty breathing. You have chest pain. You have any allergic problems. Document Released: 08/01/2000 Document Revised: 04/30/2013 Document Reviewed: 11/08/2012 [facility] Patient Information 2015 [place], [place]. This information is not intended to replace advice given to you by your health care provider. Make sure you discuss any questions you have with your health care provider.  Care After Refer to this sheet in the next few weeks. These instructions provide you with information on caring for yourself after your procedure. Your caregiver may also give you more specific instructions. Your  treatment has been planned according to current medical practices, but problems sometimes occur. Call your caregiver if you have any problems or questions after your procedure. HOME CARE INSTRUCTIONS ACTIVITY  Rest as much as possible the first two weeks at home.  Avoid strenuous activity such as heavy lifting (more than 10 pounds), pushing, or pulling. Limit stair climbing to once or twice a day for the first week, then slowly increase this activity.  Take frequent rest periods throughout the day.  Talk with your caregiver about when you may resume your usual physical activity.  You need to be out of bed and walking as much as possible. This decreases the chance of:  Blood clots.  Pneumonia. NUTRITION  You can resume your normal diet once you regain bowel function.  Drink plenty of fluids (6-8 glasses a day or as instructed by your caregiver).  Eat a well-balanced diet.  Daily portions of food from the meat (protein), milk, vegetable, and bread groups are necessary for your health. ELIMINATION It is very important not to strain during bowel movements. If constipation should occur, you may:  Take a mild laxative.  Add fruit and bran to your diet.  Drink more fluids. HYGIENE  Take showers, not baths, until 4-6 weeks after surgery.  If your incision is closed, you may take a shower or tub bath. FEVER If you feel feverish or have shaking chills, take your temperature. If it is 102 F (38.9 C), call your caregiver. The fever may mean there is an infection. PAIN CONTROL  Mild discomfort may occur.  Only take over-the-counter or prescription medicines for pain, discomfort, or fever as directed by your caregiver. Take any prescribed medicines exactly as directed. INCISION CARE  Keep your incision site clean with soap and water.  Do not use a dressing unless your cut (incision) from surgery is draining or irritated.  If you have small adhesive strips in place and they do  not fall off within 10 days, carefully peel them off.  Check your incision and surrounding area daily for any redness, swelling, discoloration, heavy drainage, or separation of the skin. SEXUAL INTERCOURSE Do not have sexual intercourse until after your follow-up appointment, unless your caregiver tells you otherwise. SEEK MEDICAL CARE IF:   You are unable to tolerate food or drinks.  You are unable to pass gas or have a bowel movement.  Your pain becomes more severe or is not relieved with medicines.  You have redness, swelling, discoloration, heavy drainage, or separation of the skin at the incision site. Document Released: 12/08/2003 Document Revised: 04/11/2012 Document Reviewed: 04/24/2007 [facility] Patient Information 2015 [place], [place]. This information is not intended to replace advice given to you by your health care provider. Make sure you discuss any questions you have with your health care provider. Marland Kitchen

## 2014-09-22 NOTE — Transfer of Care (Signed)
Immediate Anesthesia Transfer of Care Note  Patient: Wendy Johnson  Procedure(s) Performed: Procedure(s) with comments: LAPAROSCOPY OPERATIVE - excision and fulgeration of endomertriosis  Patient Location: PACU  Anesthesia Type:General  Level of Consciousness: awake, alert  and oriented  Airway & Oxygen Therapy: Patient Spontanous Breathing and Patient connected to face mask oxygen  Post-op Assessment: Report given to RN and Post -op Vital signs reviewed and stable  Post vital signs: Reviewed and stable  Last Vitals:  Filed Vitals:   09/22/14 1334  BP: 130/85  Pulse: 82  Temp: 36.2 C  Resp: 16    Complications: No apparent anesthesia complications

## 2014-09-22 NOTE — Anesthesia Procedure Notes (Signed)
Procedure Name: Intubation Date/Time: 09/22/2014 12:01 PM Performed by: Dionne Bucy Patient Re-evaluated:Patient Re-evaluated prior to inductionOxygen Delivery Method: Circle system utilized Preoxygenation: Pre-oxygenation with 100% oxygen Intubation Type: IV induction Ventilation: Mask ventilation without difficulty Laryngoscope Size: Mac and 3 Grade View: Grade II Tube type: Oral Tube size: 7.0 mm Number of attempts: 1 Airway Equipment and Method: Stylet Placement Confirmation: ETT inserted through vocal cords under direct vision,  positive ETCO2 and breath sounds checked- equal and bilateral Secured at: 21 cm Tube secured with: Tape Dental Injury: Teeth and Oropharynx as per pre-operative assessment

## 2014-09-22 NOTE — H&P (Signed)
Date of Initial H&P: 09/18/14  History reviewed, patient examined, no change in status, stable for surgery.

## 2014-09-22 NOTE — Progress Notes (Signed)
Pt and family given instructions and states understanding.

## 2014-09-22 NOTE — Anesthesia Preprocedure Evaluation (Addendum)
Anesthesia Evaluation  Patient identified by MRN, date of birth, ID band Patient awake    Reviewed: Allergy & Precautions, H&P , NPO status , Patient's Chart, lab work & pertinent test results, reviewed documented beta blocker date and time   Airway Mallampati: II  TM Distance: >3 FB Neck ROM: full    Dental   Pulmonary Current Smoker,          Cardiovascular Rate:Normal     Neuro/Psych    GI/Hepatic GERD-  ,  Endo/Other    Renal/GU      Musculoskeletal   Abdominal   Peds  Hematology   Anesthesia Other Findings   Reproductive/Obstetrics                            Anesthesia Physical Anesthesia Plan  ASA: III  Anesthesia Plan: General ETT   Post-op Pain Management:    Induction:   Airway Management Planned:   Additional Equipment:   Intra-op Plan:   Post-operative Plan:   Informed Consent:   Plan Discussed with: CRNA  Anesthesia Plan Comments:         Anesthesia Quick Evaluation

## 2014-09-23 ENCOUNTER — Encounter: Payer: Self-pay | Admitting: Obstetrics and Gynecology

## 2014-09-23 LAB — SURGICAL PATHOLOGY

## 2014-09-23 NOTE — Anesthesia Postprocedure Evaluation (Signed)
  Anesthesia Post-op Note  Patient: Wendy Johnson  Procedure(s) Performed: Procedure(s) with comments: LAPAROSCOPY OPERATIVE - excision and fulgeration of endomertriosis  Anesthesia type:General ETT  Patient location: PACU  Post pain: Pain level controlled  Post assessment: Post-op Vital signs reviewed, Patient's Cardiovascular Status Stable, Respiratory Function Stable, Patent Airway and No signs of Nausea or vomiting  Post vital signs: Reviewed and stable  Last Vitals:  Filed Vitals:   09/22/14 1600  BP: 113/71  Pulse: 60  Temp:   Resp: 16    Level of consciousness: awake, alert  and patient cooperative  Complications: No apparent anesthesia complications

## 2014-09-26 ENCOUNTER — Ambulatory Visit
Admission: RE | Admit: 2014-09-26 | Discharge: 2014-09-26 | Disposition: A | Discharge status: Home / Self Care | Payer: Medicare Other | Source: Ambulatory Visit | Attending: Orthopedic Surgery | Admitting: Orthopedic Surgery

## 2014-09-26 DIAGNOSIS — M25551 Pain in right hip: Secondary | ICD-10-CM | POA: Insufficient documentation

## 2014-09-29 ENCOUNTER — Encounter: Payer: Self-pay | Admitting: Anesthesiology

## 2014-09-29 ENCOUNTER — Ambulatory Visit: Payer: Medicare Other | Attending: Anesthesiology | Admitting: Anesthesiology

## 2014-09-29 VITALS — BP 132/91 | HR 94 | Temp 98.3°F | Resp 16 | Ht 64.0 in | Wt 190.0 lb

## 2014-09-29 DIAGNOSIS — F1990 Other psychoactive substance use, unspecified, uncomplicated: Secondary | ICD-10-CM | POA: Insufficient documentation

## 2014-09-29 DIAGNOSIS — G8929 Other chronic pain: Secondary | ICD-10-CM | POA: Diagnosis not present

## 2014-09-29 DIAGNOSIS — M5386 Other specified dorsopathies, lumbar region: Secondary | ICD-10-CM

## 2014-09-29 DIAGNOSIS — M1990 Unspecified osteoarthritis, unspecified site: Secondary | ICD-10-CM | POA: Diagnosis not present

## 2014-09-29 DIAGNOSIS — F319 Bipolar disorder, unspecified: Secondary | ICD-10-CM | POA: Diagnosis not present

## 2014-09-29 DIAGNOSIS — M79605 Pain in left leg: Secondary | ICD-10-CM | POA: Diagnosis not present

## 2014-09-29 DIAGNOSIS — F17200 Nicotine dependence, unspecified, uncomplicated: Secondary | ICD-10-CM | POA: Diagnosis not present

## 2014-09-29 DIAGNOSIS — M170 Bilateral primary osteoarthritis of knee: Secondary | ICD-10-CM

## 2014-09-29 DIAGNOSIS — M1712 Unilateral primary osteoarthritis, left knee: Secondary | ICD-10-CM

## 2014-09-29 DIAGNOSIS — M545 Low back pain: Secondary | ICD-10-CM | POA: Insufficient documentation

## 2014-09-29 DIAGNOSIS — F418 Other specified anxiety disorders: Secondary | ICD-10-CM | POA: Diagnosis not present

## 2014-09-29 DIAGNOSIS — M79604 Pain in right leg: Secondary | ICD-10-CM | POA: Diagnosis not present

## 2014-09-29 DIAGNOSIS — M1711 Unilateral primary osteoarthritis, right knee: Secondary | ICD-10-CM

## 2014-09-29 NOTE — Progress Notes (Signed)
Patient ambulatory, discharge patient home and to return in one month . Discharge home at 1535 hrs.

## 2014-09-29 NOTE — Progress Notes (Signed)
   Subjective:    Patient ID: Wendy Johnson, female    DOB: 12/15/1965, 49 y.o.   MRN: [id number]  HPI    Review of Systems     Objective:   Physical Exam        Assessment & Plan:

## 2014-09-30 ENCOUNTER — Telehealth: Payer: Self-pay | Admitting: Anesthesiology

## 2014-09-30 DIAGNOSIS — M170 Bilateral primary osteoarthritis of knee: Secondary | ICD-10-CM | POA: Insufficient documentation

## 2014-09-30 DIAGNOSIS — F319 Bipolar disorder, unspecified: Secondary | ICD-10-CM | POA: Insufficient documentation

## 2014-09-30 DIAGNOSIS — M1712 Unilateral primary osteoarthritis, left knee: Secondary | ICD-10-CM

## 2014-09-30 DIAGNOSIS — M1711 Unilateral primary osteoarthritis, right knee: Secondary | ICD-10-CM | POA: Insufficient documentation

## 2014-09-30 DIAGNOSIS — M5386 Other specified dorsopathies, lumbar region: Secondary | ICD-10-CM | POA: Insufficient documentation

## 2014-09-30 NOTE — Progress Notes (Signed)
Subjective:    Patient ID: Wendy Johnson, female    DOB: 1966-03-18, 49 y.o.   MRN: [id number]   hief complaint: Chronic low back pain. Chronic bilateral lower extremity pain and diffuse body pain   HPI  Wendy Johnson is a pleasant 49 year old white female. She presents today for evaluation. She has a long-standing history of diffuse body pain including mid thoracic pain right lower extremity pain and left knee pain including groin pain. She states the pain is gradually gotten worse to the point where she has been referred by Dr. Orland Mustard for evaluation and treatment. Her maximum pain scores of this of 10. Pain appears to be worse with activity but does not appear to be influenced by time a day and is aggravated by bending and kneeling and walking. She uses cold and medication management for control of her pain. She has associated problems with depression spasm and associated weakness in the right leg which been chronic. Quality the pain is described as dull nagging pressure like and shooting at times. She's had previous MRI scans and nerve blocks in addition to that orthopedic evaluation and psychiatric evaluation. She has been on chronic narcotic medications for this problem he states that she was taking Percocet 10 mg twice a day followed by Dr. Gwynneth Aliment but receives medications from her orthopedist when she had a recent surgery is requested that we assist with management of her medications in the future. As well as she has been referred to see [place].  BP 132/91 mmHg  Pulse 94  Temp(Src) 98.3 F (36.8 C) (Oral)  Resp 16  Ht 5\' 4"  (1.626 m)  Wt 190 lb (86.183 kg)  BMI 32.60 kg/m2  SpO2 99%  LMP 09/26/2014    Current outpatient prescriptions:  .  amphetamine-dextroamphetamine (ADDERALL) 30 MG tablet, Take 30 mg by mouth 2 (two) times daily., Disp: , Rfl:  .  ARIPiprazole (ABILIFY) 15 MG tablet, Take 15 mg by mouth daily., Disp: , Rfl:  .  baclofen (LIORESAL) 20 MG tablet, Take 20 mg by mouth every 6  (six) hours as needed for muscle spasms. Take one half to one tablet every 6 hours as needed for neck spasms or headache, Disp: , Rfl:  .  busPIRone (BUSPAR) 10 MG tablet, Take 20 mg by mouth 2 (two) times daily. , Disp: , Rfl:  .  clonazePAM (KLONOPIN) 2 MG tablet, Take 2 mg by mouth 3 (three) times daily., Disp: , Rfl:  .  cloNIDine (CATAPRES) 0.1 MG tablet, Take 0.1 mg by mouth daily., Disp: , Rfl:  .  dexlansoprazole (DEXILANT) 60 MG capsule, Take 60 mg by mouth daily., Disp: , Rfl:  .  ibuprofen (ADVIL,MOTRIN) 800 MG tablet, Take 800 mg by mouth as needed. , Disp: , Rfl:  .  ibuprofen (ADVIL,MOTRIN) 800 MG tablet, Take 1 tablet (800 mg total) by mouth 3 (three) times daily., Disp: 30 tablet, Rfl: 1 .  pravastatin (PRAVACHOL) 40 MG tablet, Take 40 mg by mouth daily., Disp: , Rfl:  .  sertraline (ZOLOFT) 50 MG tablet, Take 100 mg by mouth 2 (two) times daily. , Disp: , Rfl:  .  traZODone (DESYREL) 150 MG tablet, Take 150 mg by mouth at bedtime as needed. Takes 2 tablets qhs prn, Disp: , Rfl:  .  hydrochlorothiazide (MICROZIDE) 12.5 MG capsule, Take 12.5 mg by mouth daily., Disp: , Rfl:  .  lubiprostone (AMITIZA) 8 MCG capsule, Take 8 mcg by mouth 2 (two) times daily with a meal., Disp: ,  Rfl:  .  metroNIDAZOLE (FLAGYL) 500 MG tablet, Take 500 mg by mouth 2 (two) times daily., Disp: , Rfl:  .  oxyCODONE-acetaminophen (PERCOCET/ROXICET) 5-325 MG per tablet, Take 1-2 tablets by mouth every 6 (six) hours as needed for severe pain., Disp: , Rfl:  .  oxyCODONE-acetaminophen (ROXICET) 5-325 MG per tablet, Take 1-2 tablets by mouth every 4 (four) hours as needed for moderate pain or severe pain. (Patient not taking: Reported on 09/29/2014), Disp: 30 tablet, Rfl: 0   Patient Active Problem List   Diagnosis Date Noted  . Low back derangement syndrome 09/30/2014  . Degenerative joint disease of both lower legs 09/30/2014  . Bipolar disorder 09/30/2014  . NONSPECIFIC ABN FINDING RAD & OTH EXAM GI TRACT  04/12/2010     Review of Systems  Review of systems negative for cardiovascular negative for pulmonary she snores if her neurologic positive for anxiety depression panic attacks and insomnia she also has bipolar disease as CD with a history of reflux negative for GU negative for hematologic negative for endocrine negative for rheumatologic  Social history is positive for being divorced with one child she smokes pack cigarettes per day for doing this for 12 years  Currently disabled since 2006.  Family history is positive for alcoholism diabetes and high blood pressure     Objective:   Physical Exam  Patient is 49 year old white female in no acute distress she's alert oranges requires been compliant heart is regular rate and rhythm and lungs are clear also dictation bilaterally wheeze. Traction low back reveals some paraspinous muscle tenderness but no overt trigger points. She also has midthoracic paraspinous muscle tenderness. He has pain on extension and bilateral rotation in standing position. Preserved throughout the lower extremities with good muscle tone and bulk. No fasciculations are noted sensation appears to be grossly intact as have some mild swelling about the right knee following surgery and this is followed by her orthopedic physicians. He has a well-healed scar on the right side.       Assessment & Plan:  #1. Chronic low back pain with low back syndrome  #2. Diffuse body pain  #3. Degenerative joint disease  #4. History of bipolar disorder  #5. History of anxiety depression on chronic adenoid management  Plan: #1. A long discussion with patient regarding her care and I think it would be reasonable based on the chronicity and severity of her pain to have her on February management as the pain has been quite recalcitrant and she has been quite miserable with both her back pain and lower extremity pain. She will need continued follow-up with her orthopedic physicians as  well as her primary care physicians. She has been on Percocet 10 mg tablets one twice a day for this. On her evaluation as to the uses of her medications consider except for patient for chronic adenoid management and allow her to be on Percocet 10 mg 1 by mouth twice a day for which she can't take a half tablet 4 times a day when necessary if necessary. Return to clinic in 1 month for reevaluatioN.

## 2014-09-30 NOTE — Telephone Encounter (Signed)
Patient called and instructed that Dr. Andree Elk is not going to consider prescribing meds until her next visit (at the soonest.) Dr. Ancil Boozer office called and told that we did not prescribe any meds at new patient visit yesterday.

## 2014-09-30 NOTE — Telephone Encounter (Signed)
Patient came to clinic asking if we had sent note to her doctors telling them we were not writing her meds on first visit. They will not write meds until they get that information. Informed pt i would notify nurse. Pt left.  Pt called shortly after that asking did we have her scripts ready as she could not get from dr Ancil Boozer. She said she might have messed up with dr Ancil Boozer by getting her meds from diff doctor but she had to have meds today. She said she had knee surgery and got some diff meds.

## 2014-10-09 DIAGNOSIS — F410 Panic disorder [episodic paroxysmal anxiety] without agoraphobia: Secondary | ICD-10-CM | POA: Diagnosis not present

## 2014-10-09 DIAGNOSIS — F3132 Bipolar disorder, current episode depressed, moderate: Secondary | ICD-10-CM | POA: Diagnosis not present

## 2014-10-09 DIAGNOSIS — F902 Attention-deficit hyperactivity disorder, combined type: Secondary | ICD-10-CM | POA: Diagnosis not present

## 2014-10-09 DIAGNOSIS — F4310 Post-traumatic stress disorder, unspecified: Secondary | ICD-10-CM | POA: Diagnosis not present

## 2014-10-16 ENCOUNTER — Encounter: Payer: Self-pay | Admitting: Obstetrics and Gynecology

## 2014-10-16 ENCOUNTER — Ambulatory Visit (INDEPENDENT_AMBULATORY_CARE_PROVIDER_SITE_OTHER): Payer: Medicare Other | Admitting: Obstetrics and Gynecology

## 2014-10-16 ENCOUNTER — Other Ambulatory Visit: Payer: Medicare Other

## 2014-10-16 ENCOUNTER — Encounter
Admission: RE | Admit: 2014-10-16 | Discharge: 2014-10-16 | Disposition: A | Discharge status: Home / Self Care | Payer: Medicare Other | Source: Ambulatory Visit | Attending: Obstetrics and Gynecology | Admitting: Obstetrics and Gynecology

## 2014-10-16 ENCOUNTER — Encounter: Payer: Self-pay | Admitting: *Deleted

## 2014-10-16 VITALS — BP 109/75 | HR 83 | Ht 64.0 in | Wt 204.8 lb

## 2014-10-16 DIAGNOSIS — J449 Chronic obstructive pulmonary disease, unspecified: Secondary | ICD-10-CM | POA: Diagnosis not present

## 2014-10-16 DIAGNOSIS — I10 Essential (primary) hypertension: Secondary | ICD-10-CM | POA: Diagnosis not present

## 2014-10-16 DIAGNOSIS — F5104 Psychophysiologic insomnia: Secondary | ICD-10-CM | POA: Diagnosis not present

## 2014-10-16 DIAGNOSIS — Z8742 Personal history of other diseases of the female genital tract: Secondary | ICD-10-CM | POA: Insufficient documentation

## 2014-10-16 DIAGNOSIS — Z01812 Encounter for preprocedural laboratory examination: Secondary | ICD-10-CM | POA: Insufficient documentation

## 2014-10-16 DIAGNOSIS — M545 Low back pain: Secondary | ICD-10-CM | POA: Diagnosis not present

## 2014-10-16 DIAGNOSIS — F319 Bipolar disorder, unspecified: Secondary | ICD-10-CM | POA: Insufficient documentation

## 2014-10-16 DIAGNOSIS — K219 Gastro-esophageal reflux disease without esophagitis: Secondary | ICD-10-CM | POA: Insufficient documentation

## 2014-10-16 DIAGNOSIS — E780 Pure hypercholesterolemia: Secondary | ICD-10-CM | POA: Diagnosis not present

## 2014-10-16 DIAGNOSIS — F42 Obsessive-compulsive disorder: Secondary | ICD-10-CM | POA: Diagnosis not present

## 2014-10-16 DIAGNOSIS — M179 Osteoarthritis of knee, unspecified: Secondary | ICD-10-CM | POA: Diagnosis not present

## 2014-10-16 DIAGNOSIS — F418 Other specified anxiety disorders: Secondary | ICD-10-CM | POA: Diagnosis not present

## 2014-10-16 DIAGNOSIS — Z01818 Encounter for other preprocedural examination: Secondary | ICD-10-CM

## 2014-10-16 DIAGNOSIS — E785 Hyperlipidemia, unspecified: Secondary | ICD-10-CM | POA: Diagnosis not present

## 2014-10-16 DIAGNOSIS — N809 Endometriosis, unspecified: Secondary | ICD-10-CM

## 2014-10-16 DIAGNOSIS — Z720 Tobacco use: Secondary | ICD-10-CM | POA: Insufficient documentation

## 2014-10-16 LAB — CBC WITH DIFFERENTIAL/PLATELET
BASOS ABS: 0.1 10*3/uL (ref 0–0.1)
Basophils Relative: 1 %
EOS PCT: 5 %
Eosinophils Absolute: 0.3 10*3/uL (ref 0–0.7)
HEMATOCRIT: 41.7 % (ref 35.0–47.0)
Hemoglobin: 13.6 g/dL (ref 12.0–16.0)
LYMPHS PCT: 31 %
Lymphs Abs: 2 10*3/uL (ref 1.0–3.6)
MCH: 28.5 pg (ref 26.0–34.0)
MCHC: 32.6 g/dL (ref 32.0–36.0)
MCV: 87.4 fL (ref 80.0–100.0)
Monocytes Absolute: 0.4 10*3/uL (ref 0.2–0.9)
Monocytes Relative: 7 %
NEUTROS PCT: 56 %
Neutro Abs: 3.7 10*3/uL (ref 1.4–6.5)
Platelets: 198 10*3/uL (ref 150–440)
RBC: 4.77 MIL/uL (ref 3.80–5.20)
RDW: 14.4 % (ref 11.5–14.5)
WBC: 6.4 10*3/uL (ref 3.6–11.0)

## 2014-10-16 LAB — ABO/RH: ABO/RH(D): A POS

## 2014-10-16 LAB — TYPE AND SCREEN
ABO/RH(D): A POS
ANTIBODY SCREEN: NEGATIVE

## 2014-10-16 LAB — RAPID HIV SCREEN (HIV 1/2 AB+AG)
HIV 1/2 ANTIBODIES: NONREACTIVE
HIV-1 P24 ANTIGEN - HIV24: NONREACTIVE

## 2014-10-16 MED ORDER — OXYCODONE-ACETAMINOPHEN 5-325 MG PO TABS: 1.0000 | ORAL_TABLET | ORAL | Status: DC | PRN

## 2014-10-16 NOTE — Progress Notes (Signed)
Subjective:  PRE-OPERATIVE HISTORY AND PHYSICAL Date of surgery: 10/20/2014    Patient is a 49 y.o. G1P1079female scheduled for TAH/BSO through a midline incision. Indications for procedure are Endometriosis.  The patient has endometriosis diagnosed through laparoscopy with excision and fulguration of implants.  Pelvic adhesions were noted.  Patient has desired to proceed with definitive surgical management for this condition. Due to anatomic factors, including an anthropoid pelvis, obesity, moderate abdominal pannus, the recommendation is for her to undergo an abdominal hysterectomy through a midline incision.   Pertinent Gynecological History: Laparoscopy on 09/22/2014 demonstrated cul-de-sac endometriosis, adhesions, ovarian fossa, endosalpingosis Menses: flow is moderate Bleeding: dysfunctional uterine bleeding Contraception: tubal ligation Last mammogram: normal  Last pap: normal   Menstrual History: OB History    Gravida Para Term Preterm AB TAB SAB Ectopic Multiple Living   1 1 1       1      Patient's last menstrual period was 09/26/2014.    Past Medical History  Diagnosis Date  . High cholesterol   . GERD (gastroesophageal reflux disease)   . Anxiety   . Depression   . OCD (obsessive compulsive disorder)   . Chronic back pain   . Chronic constipation   . Severe obesity   . AR (allergic rhinitis)   . Trochanteric bursitis of right hip   . Tobacco use   . Hepatomegaly   . Dyslipidemia   . COPD (chronic obstructive pulmonary disease)   . Decreased dorsalis pedis pulse   . Fatty liver   . Chronic insomnia   . Benign hypertension   . Deaf   . Hot flashes   . Migraine with aura     Past Surgical History  Procedure Laterality Date  . Lipoma excision    . Knee arthroscopo Right   . Joint replacement Right   . Tonsillectomy    . Tubal ligation    . Laparoscopy  09/22/2014    Procedure: LAPAROSCOPY OPERATIVE;  Surgeon: Brayton Mars, MD;  Location: [facility]  ORS;  Service: Gynecology;;  excision and fulgeration of endomertriosis  . Foot surgery Bilateral   . Bone spurs removed Bilateral     OB History  Gravida Para Term Preterm AB SAB TAB Ectopic Multiple Living  1 1 1       1     # Outcome Date GA Lbr Len/2nd Weight Sex Delivery Anes PTL Lv  1 Term 1994   6 lb 4 oz (2.835 kg) M Vag-Spont   Y      History   Social History  . Marital Status: Divorced    Spouse Name: N/A  . Number of Children: N/A  . Years of Education: N/A   Social History Main Topics  . Smoking status: Current Every Day Smoker -- 0.50 packs/day for 34 years    Types: Cigarettes  . Smokeless tobacco: Never Used  . Alcohol Use: 0.0 oz/week    0 Standard drinks or equivalent per week     Comment: moderate use; h/o of heavy use. binges about once a month  . Drug Use: No     Comment: quit crack cocaine 2004  . Sexual Activity: Yes    Birth Control/ Protection: None   Other Topics Concern  . None   Social History Narrative    Family History  Problem Relation Age of Onset  . Diabetes Mother   . Heart disease Father   . Heart attack Father   . Diabetes Sister      (  Not in a hospital admission)  Allergies  Allergen Reactions  . Imitrex [Sumatriptan] Other (See Comments)    Chest pain    Review of Systems Constitutional: No recent fever/chills/sweats Respiratory: No recent cough/bronchitis Cardiovascular: No chest pain Gastrointestinal: No recent nausea/vomiting/diarrhea Genitourinary: No UTI symptoms Hematologic/lymphatic:No history of coagulopathy or recent blood thinner use    Objective:    BP 109/75 mmHg  Pulse 83  Ht 5\' 4"  (1.626 m)  Wt 204 lb 12.8 oz (92.897 kg)  BMI 35.14 kg/m2  LMP 09/26/2014  General:   Normal  Skin:   normal  HEENT:  Normal  Neck:  Supple without Adenopathy or Thyromegaly  Lungs:   Heart:              Breasts:   Abdomen:  Pelvis:  M/S   Extremeties:  Neuro:    clear to auscultation bilaterally   Normal  without murmur   Not Examined   soft, non-tender; bowel sounds normal; no masses,  no organomegaly   Exam deferred to OR  No CVAT  Warm/Dry   Normal          Assessment:      1.  Symptomatic endometriosis       Plan:   1.  TAH/BSO through a midline incision.  Preoperative counseling: The patient is to undergo TAH/BSO through a midline incision on 10/20/2014.  She is understanding of the planned procedure and is aware of and is accepting of all surgical risks which include but are not limited to bleeding, infection, pelvic organ injury, need for repair, blood clots disorders, anesthesia risks, etc.  Patient understands that she may need estrogen replacement therapy postoperatively for management of vasomotor symptoms and or for prevention of osteoporosis.  QUESTIONS have been answered.  Informed consent is given.  Patient is ready and willing to proceed with surgery as scheduled.  Brayton Mars, MD

## 2014-10-16 NOTE — Patient Instructions (Signed)
1.  RTC 1 week for postop check. 2.  Perioperative management discussed.

## 2014-10-16 NOTE — Patient Instructions (Signed)
  Your procedure is scheduled on: 10/20/14 Report to Day Surgery. To find out your arrival time please call [phone] between 1PM - 3PM on 10/17/14.  Remember: Instructions that are not followed completely may result in serious medical risk, up to and including death, or upon the discretion of your surgeon and anesthesiologist your surgery may need to be rescheduled.    ____x 1. Do not eat food or drink liquids after midnight. No gum chewing or hard candies.     ___x_ 2. No Alcohol for 24 hours before or after surgery.   ____ 3. Bring all medications with you on the day of surgery if instructed.    ___x_ 4. Notify your doctor if there is any change in your medical condition     (cold, fever, infections).     Do not wear jewelry, make-up, hairpins, clips or nail polish.  Do not wear lotions, powders, or perfumes. You may wear deodorant.  Do not shave 48 hours prior to surgery. Men may shave face and neck.  Do not bring valuables to the hospital.    [facility] is not responsible for any belongings or valuables.               Contacts, dentures or bridgework may not be worn into surgery.  Leave your suitcase in the car. After surgery it may be brought to your room.  For patients admitted to the hospital, discharge time is determined by your                treatment team.   Patients discharged the day of surgery will not be allowed to drive home.   Please read over the following fact sheets that you were given:   Surgical Site Infection Prevention   ____ Take these medicines the morning of surgery with A SIP OF WATER:    1. dexilant  2. abilify  3. buspirone  4. Klonopin  5. May take if needed Roxicet  6.zoloft  ____ Fleet Enema (as directed)   __x__ Use CHG Soap as directed  ____ Use inhalers on the day of surgery  ____ Stop metformin 2 days prior to surgery    ____ Take 1/2 of usual insulin dose the night before surgery and none on the morning of surgery.   ___   Stop Coumadin/Plavix/aspirin on  ___x_ Stop Anti-inflammatories on today Tylenol only for pain   ____ Stop supplements until after surgery.    ____ Bring C-Pap to the hospital.

## 2014-10-17 DIAGNOSIS — M1612 Unilateral primary osteoarthritis, left hip: Secondary | ICD-10-CM | POA: Diagnosis not present

## 2014-10-17 LAB — RPR: RPR Ser Ql: NONREACTIVE

## 2014-10-20 ENCOUNTER — Inpatient Hospital Stay
Admission: RE | Admit: 2014-10-20 | Discharge: 2014-10-22 | DRG: 743 | Disposition: A | Discharge status: Home / Self Care | Payer: Medicare Other | Source: Ambulatory Visit | Attending: Obstetrics and Gynecology | Admitting: Obstetrics and Gynecology

## 2014-10-20 ENCOUNTER — Inpatient Hospital Stay: Payer: Medicare Other | Admitting: Anesthesiology

## 2014-10-20 ENCOUNTER — Encounter
Admission: RE | Disposition: A | Discharge status: Home / Self Care | Payer: Self-pay | Source: Ambulatory Visit | Attending: Obstetrics and Gynecology

## 2014-10-20 ENCOUNTER — Encounter: Payer: Self-pay | Admitting: Family Medicine

## 2014-10-20 ENCOUNTER — Encounter: Payer: Self-pay | Admitting: Obstetrics and Gynecology

## 2014-10-20 DIAGNOSIS — N809 Endometriosis, unspecified: Secondary | ICD-10-CM | POA: Diagnosis present

## 2014-10-20 DIAGNOSIS — F17210 Nicotine dependence, cigarettes, uncomplicated: Secondary | ICD-10-CM | POA: Diagnosis present

## 2014-10-20 DIAGNOSIS — K219 Gastro-esophageal reflux disease without esophagitis: Secondary | ICD-10-CM | POA: Diagnosis present

## 2014-10-20 DIAGNOSIS — E780 Pure hypercholesterolemia: Secondary | ICD-10-CM | POA: Diagnosis present

## 2014-10-20 DIAGNOSIS — Z833 Family history of diabetes mellitus: Secondary | ICD-10-CM

## 2014-10-20 DIAGNOSIS — N830 Follicular cyst of ovary: Secondary | ICD-10-CM | POA: Diagnosis not present

## 2014-10-20 DIAGNOSIS — E65 Localized adiposity: Secondary | ICD-10-CM | POA: Diagnosis present

## 2014-10-20 DIAGNOSIS — I10 Essential (primary) hypertension: Secondary | ICD-10-CM | POA: Diagnosis present

## 2014-10-20 DIAGNOSIS — H9190 Unspecified hearing loss, unspecified ear: Secondary | ICD-10-CM | POA: Diagnosis present

## 2014-10-20 DIAGNOSIS — G8929 Other chronic pain: Secondary | ICD-10-CM | POA: Diagnosis present

## 2014-10-20 DIAGNOSIS — N736 Female pelvic peritoneal adhesions (postinfective): Secondary | ICD-10-CM | POA: Diagnosis present

## 2014-10-20 DIAGNOSIS — N938 Other specified abnormal uterine and vaginal bleeding: Secondary | ICD-10-CM | POA: Diagnosis present

## 2014-10-20 DIAGNOSIS — N8320 Unspecified ovarian cysts: Secondary | ICD-10-CM | POA: Diagnosis present

## 2014-10-20 DIAGNOSIS — Z9851 Tubal ligation status: Secondary | ICD-10-CM | POA: Diagnosis not present

## 2014-10-20 DIAGNOSIS — M549 Dorsalgia, unspecified: Secondary | ICD-10-CM | POA: Diagnosis present

## 2014-10-20 DIAGNOSIS — K760 Fatty (change of) liver, not elsewhere classified: Secondary | ICD-10-CM | POA: Diagnosis present

## 2014-10-20 DIAGNOSIS — F42 Obsessive-compulsive disorder: Secondary | ICD-10-CM | POA: Diagnosis present

## 2014-10-20 DIAGNOSIS — Z8249 Family history of ischemic heart disease and other diseases of the circulatory system: Secondary | ICD-10-CM | POA: Diagnosis not present

## 2014-10-20 DIAGNOSIS — J449 Chronic obstructive pulmonary disease, unspecified: Secondary | ICD-10-CM | POA: Diagnosis present

## 2014-10-20 DIAGNOSIS — N994 Postprocedural pelvic peritoneal adhesions: Secondary | ICD-10-CM | POA: Diagnosis not present

## 2014-10-20 DIAGNOSIS — Z6835 Body mass index (BMI) 35.0-35.9, adult: Secondary | ICD-10-CM | POA: Diagnosis not present

## 2014-10-20 DIAGNOSIS — F319 Bipolar disorder, unspecified: Secondary | ICD-10-CM | POA: Diagnosis present

## 2014-10-20 DIAGNOSIS — M2570 Osteophyte, unspecified joint: Secondary | ICD-10-CM | POA: Diagnosis present

## 2014-10-20 DIAGNOSIS — Z90710 Acquired absence of both cervix and uterus: Secondary | ICD-10-CM | POA: Insufficient documentation

## 2014-10-20 DIAGNOSIS — F419 Anxiety disorder, unspecified: Secondary | ICD-10-CM | POA: Diagnosis present

## 2014-10-20 DIAGNOSIS — Q516 Embryonic cyst of cervix: Secondary | ICD-10-CM | POA: Diagnosis not present

## 2014-10-20 DIAGNOSIS — E785 Hyperlipidemia, unspecified: Secondary | ICD-10-CM | POA: Diagnosis present

## 2014-10-20 HISTORY — PX: ABDOMINAL HYSTERECTOMY: SHX81

## 2014-10-20 LAB — POCT PREGNANCY, URINE: Preg Test, Ur: NEGATIVE

## 2014-10-20 SURGERY — HYSTERECTOMY, ABDOMINAL
Anesthesia: General | Wound class: Clean Contaminated

## 2014-10-20 MED ORDER — PANTOPRAZOLE SODIUM 40 MG PO TBEC
40.0000 mg | DELAYED_RELEASE_TABLET | Freq: Every day | ORAL | Status: DC
  Administered 2014-10-21 – 2014-10-22 (×2): 40 mg via ORAL
  Filled 2014-10-20 (×2): qty 1

## 2014-10-20 MED ORDER — SIMETHICONE 80 MG PO CHEW: 80.0000 mg | CHEWABLE_TABLET | Freq: Four times a day (QID) | ORAL | Status: DC | PRN

## 2014-10-20 MED ORDER — SUGAMMADEX SODIUM 200 MG/2ML IV SOLN
INTRAVENOUS | Status: DC | PRN
  Administered 2014-10-20: 180 mg via INTRAVENOUS

## 2014-10-20 MED ORDER — DOCUSATE SODIUM 100 MG PO CAPS
100.0000 mg | ORAL_CAPSULE | Freq: Two times a day (BID) | ORAL | Status: DC
  Administered 2014-10-20 – 2014-10-22 (×5): 100 mg via ORAL
  Filled 2014-10-20 (×5): qty 1

## 2014-10-20 MED ORDER — ONDANSETRON HCL 4 MG/2ML IJ SOLN
INTRAMUSCULAR | Status: DC | PRN
  Administered 2014-10-20: 4 mg via INTRAVENOUS

## 2014-10-20 MED ORDER — LACTATED RINGERS IV SOLN
INTRAVENOUS | Status: DC
  Administered 2014-10-20 – 2014-10-21 (×3): via INTRAVENOUS

## 2014-10-20 MED ORDER — ACETAMINOPHEN 325 MG PO TABS: 650.0000 mg | ORAL_TABLET | ORAL | Status: DC | PRN

## 2014-10-20 MED ORDER — SERTRALINE HCL 100 MG PO TABS
100.0000 mg | ORAL_TABLET | Freq: Two times a day (BID) | ORAL | Status: DC
  Administered 2014-10-20 – 2014-10-22 (×5): 100 mg via ORAL
  Filled 2014-10-20 (×5): qty 1

## 2014-10-20 MED ORDER — MORPHINE SULFATE 2 MG/ML IJ SOLN
1.0000 mg | INTRAMUSCULAR | Status: DC | PRN
  Administered 2014-10-20 – 2014-10-21 (×9): 2 mg via INTRAVENOUS
  Filled 2014-10-20 (×10): qty 1

## 2014-10-20 MED ORDER — FENTANYL CITRATE (PF) 100 MCG/2ML IJ SOLN
INTRAMUSCULAR | Status: DC | PRN
  Administered 2014-10-20: 50 ug via INTRAVENOUS
  Administered 2014-10-20: 150 ug via INTRAVENOUS
  Administered 2014-10-20 (×3): 50 ug via INTRAVENOUS

## 2014-10-20 MED ORDER — GABAPENTIN 100 MG PO CAPS
200.0000 mg | ORAL_CAPSULE | Freq: Two times a day (BID) | ORAL | Status: DC
  Administered 2014-10-20 – 2014-10-21 (×2): 200 mg via ORAL
  Filled 2014-10-20 (×8): qty 2

## 2014-10-20 MED ORDER — CLONAZEPAM 1 MG PO TABS
1.0000 mg | ORAL_TABLET | Freq: Three times a day (TID) | ORAL | Status: DC | PRN
  Administered 2014-10-20 (×2): 1 mg via ORAL
  Administered 2014-10-21 – 2014-10-22 (×2): 2 mg via ORAL
  Filled 2014-10-20: qty 1
  Filled 2014-10-20 (×2): qty 2
  Filled 2014-10-20 (×2): qty 1

## 2014-10-20 MED ORDER — BISACODYL 10 MG RE SUPP: 10.0000 mg | Freq: Every day | RECTAL | Status: DC | PRN

## 2014-10-20 MED ORDER — HYDROCHLOROTHIAZIDE 25 MG PO TABS
12.5000 mg | ORAL_TABLET | Freq: Every day | ORAL | Status: DC
  Administered 2014-10-22: 12.5 mg via ORAL
  Filled 2014-10-20: qty 1

## 2014-10-20 MED ORDER — HYDROMORPHONE HCL 1 MG/ML IJ SOLN
INTRAMUSCULAR | Status: AC
  Administered 2014-10-20: 0.25 mg via INTRAVENOUS
  Filled 2014-10-20: qty 1

## 2014-10-20 MED ORDER — PRAVASTATIN SODIUM 20 MG PO TABS
40.0000 mg | ORAL_TABLET | Freq: Every day | ORAL | Status: DC
  Administered 2014-10-20 – 2014-10-21 (×2): 40 mg via ORAL
  Filled 2014-10-20: qty 2
  Filled 2014-10-20: qty 1
  Filled 2014-10-20 (×2): qty 2

## 2014-10-20 MED ORDER — KETOROLAC TROMETHAMINE 30 MG/ML IJ SOLN
INTRAMUSCULAR | Status: DC | PRN
  Administered 2014-10-20: 30 mg via INTRAVENOUS

## 2014-10-20 MED ORDER — FENTANYL CITRATE (PF) 100 MCG/2ML IJ SOLN: 25.0000 ug | INTRAMUSCULAR | Status: DC | PRN

## 2014-10-20 MED ORDER — LACTATED RINGERS IV SOLN: INTRAVENOUS | Status: DC

## 2014-10-20 MED ORDER — ALBUTEROL SULFATE (2.5 MG/3ML) 0.083% IN NEBU: 2.5000 mg | INHALATION_SOLUTION | Freq: Four times a day (QID) | RESPIRATORY_TRACT | Status: DC | PRN

## 2014-10-20 MED ORDER — ALBUTEROL SULFATE HFA 108 (90 BASE) MCG/ACT IN AERS: 2.0000 | INHALATION_SPRAY | Freq: Four times a day (QID) | RESPIRATORY_TRACT | Status: DC | PRN

## 2014-10-20 MED ORDER — OXYCODONE-ACETAMINOPHEN 5-325 MG PO TABS
1.0000 | ORAL_TABLET | ORAL | Status: DC | PRN
  Administered 2014-10-21: 2 via ORAL
  Filled 2014-10-20: qty 2

## 2014-10-20 MED ORDER — KETOROLAC TROMETHAMINE 30 MG/ML IJ SOLN: 30.0000 mg | Freq: Four times a day (QID) | INTRAMUSCULAR | Status: DC

## 2014-10-20 MED ORDER — HYDROMORPHONE HCL 1 MG/ML IJ SOLN
0.2500 mg | INTRAMUSCULAR | Status: DC | PRN
  Administered 2014-10-20 (×4): 0.25 mg via INTRAVENOUS

## 2014-10-20 MED ORDER — LIDOCAINE 5 % EX PTCH
MEDICATED_PATCH | CUTANEOUS | Status: DC | PRN
  Administered 2014-10-20: 1 via TRANSDERMAL

## 2014-10-20 MED ORDER — STERILE WATER FOR IRRIGATION IR SOLN
Status: DC | PRN
  Administered 2014-10-20: 1000 mL

## 2014-10-20 MED ORDER — TIOTROPIUM BROMIDE MONOHYDRATE 18 MCG IN CAPS
18.0000 ug | ORAL_CAPSULE | Freq: Every day | RESPIRATORY_TRACT | Status: DC
  Administered 2014-10-21 – 2014-10-22 (×2): 18 ug via RESPIRATORY_TRACT
  Filled 2014-10-20: qty 5

## 2014-10-20 MED ORDER — MIDAZOLAM HCL 2 MG/2ML IJ SOLN
INTRAMUSCULAR | Status: DC | PRN
  Administered 2014-10-20: 2 mg via INTRAVENOUS

## 2014-10-20 MED ORDER — BACLOFEN 10 MG PO TABS
20.0000 mg | ORAL_TABLET | Freq: Four times a day (QID) | ORAL | Status: DC | PRN
  Filled 2014-10-20: qty 1

## 2014-10-20 MED ORDER — BUSPIRONE HCL 10 MG PO TABS
20.0000 mg | ORAL_TABLET | Freq: Two times a day (BID) | ORAL | Status: DC
  Administered 2014-10-21 – 2014-10-22 (×2): 20 mg via ORAL
  Filled 2014-10-20 (×7): qty 2

## 2014-10-20 MED ORDER — ONDANSETRON HCL 4 MG/2ML IJ SOLN: 4.0000 mg | Freq: Once | INTRAMUSCULAR | Status: DC | PRN

## 2014-10-20 MED ORDER — PROPOFOL 10 MG/ML IV BOLUS
INTRAVENOUS | Status: DC | PRN
  Administered 2014-10-20: 150 mg via INTRAVENOUS

## 2014-10-20 MED ORDER — TRAZODONE HCL 100 MG PO TABS
300.0000 mg | ORAL_TABLET | Freq: Every evening | ORAL | Status: DC | PRN
  Filled 2014-10-20: qty 2

## 2014-10-20 MED ORDER — ADULT MULTIVITAMIN W/MINERALS CH
1.0000 | ORAL_TABLET | Freq: Every day | ORAL | Status: DC
  Administered 2014-10-20 – 2014-10-22 (×3): 1 via ORAL
  Filled 2014-10-20 (×4): qty 1

## 2014-10-20 MED ORDER — ROCURONIUM BROMIDE 100 MG/10ML IV SOLN
INTRAVENOUS | Status: DC | PRN
  Administered 2014-10-20: 10 mg via INTRAVENOUS
  Administered 2014-10-20: 40 mg via INTRAVENOUS

## 2014-10-20 MED ORDER — GABAPENTIN 300 MG PO CAPS
300.0000 mg | ORAL_CAPSULE | Freq: Every day | ORAL | Status: DC
  Administered 2014-10-21 – 2014-10-22 (×2): 300 mg via ORAL
  Filled 2014-10-20 (×3): qty 1

## 2014-10-20 MED ORDER — ARIPIPRAZOLE 15 MG PO TABS
15.0000 mg | ORAL_TABLET | Freq: Every day | ORAL | Status: DC
  Administered 2014-10-20 – 2014-10-21 (×2): 15 mg via ORAL
  Filled 2014-10-20 (×4): qty 1

## 2014-10-20 MED ORDER — KETOROLAC TROMETHAMINE 30 MG/ML IJ SOLN
30.0000 mg | Freq: Four times a day (QID) | INTRAMUSCULAR | Status: DC
  Administered 2014-10-20 – 2014-10-21 (×5): 30 mg via INTRAVENOUS
  Filled 2014-10-20 (×5): qty 1

## 2014-10-20 MED ORDER — LACTATED RINGERS IV SOLN
INTRAVENOUS | Status: DC
  Administered 2014-10-20: 07:00:00 via INTRAVENOUS

## 2014-10-20 SURGICAL SUPPLY — 35 items
BAG BILE T-TUBES STRL (MISCELLANEOUS) IMPLANT
CANISTER SUCT 1200ML W/VALVE (MISCELLANEOUS) ×3 IMPLANT
CATH TRAY 16F METER LATEX (MISCELLANEOUS) ×3 IMPLANT
DRAPE LAPAROTOMY 100X77 ABD (DRAPES) ×3 IMPLANT
DRAPE LAPAROTOMY TRNSV 106X77 (MISCELLANEOUS) IMPLANT
DRSG TELFA 3X8 NADH (GAUZE/BANDAGES/DRESSINGS) ×3 IMPLANT
ELECT BLADE 6 FLAT ULTRCLN (ELECTRODE) ×3 IMPLANT
ELECT BLADE 6.5 EXT (BLADE) ×3 IMPLANT
ELECT CAUTERY BLADE 6.4 (BLADE) ×3 IMPLANT
GAUZE SPONGE 4X4 12PLY STRL (GAUZE/BANDAGES/DRESSINGS) ×3 IMPLANT
GLOVE BIO SURGEON STRL SZ 6 (GLOVE) ×9 IMPLANT
GLOVE INDICATOR 8.0 STRL GRN (GLOVE) ×3 IMPLANT
GOWN STRL REUS W/ TWL LRG LVL3 (GOWN DISPOSABLE) ×4 IMPLANT
GOWN STRL REUS W/ TWL XL LVL3 (GOWN DISPOSABLE) ×2 IMPLANT
GOWN STRL REUS W/TWL LRG LVL3 (GOWN DISPOSABLE) ×2
GOWN STRL REUS W/TWL XL LVL3 (GOWN DISPOSABLE) ×1
KIT RM TURNOVER STRD PROC AR (KITS) ×3 IMPLANT
LABEL OR SOLS (LABEL) ×3 IMPLANT
LIGASURE IMPACT 36 18CM CVD LR (INSTRUMENTS) ×3 IMPLANT
PACK BASIN MAJOR ARMC (MISCELLANEOUS) ×3 IMPLANT
PAD GROUND ADULT SPLIT (MISCELLANEOUS) ×3 IMPLANT
STAPLER SKIN PROX 35W (STAPLE) ×3 IMPLANT
SUT CHROMIC 0 CT 1 (SUTURE) IMPLANT
SUT MAXON ABS #0 GS21 30IN (SUTURE) ×6 IMPLANT
SUT SILK 0 (SUTURE) ×1
SUT SILK 0 30XBRD TIE 6 (SUTURE) ×2 IMPLANT
SUT VIC AB 0 CT1 27 (SUTURE) ×3
SUT VIC AB 0 CT1 27XCR 8 STRN (SUTURE) ×6 IMPLANT
SUT VIC AB 1 CT1 36 (SUTURE) ×3 IMPLANT
SUT VIC AB 2-0 CT1 27 (SUTURE) ×1
SUT VIC AB 2-0 CT1 TAPERPNT 27 (SUTURE) ×2 IMPLANT
SUTURE 3-0 VICRYL ×3 IMPLANT
TRAY PREP VAG/GEN (MISCELLANEOUS) IMPLANT
TUBING ART PRESS 48 MALE/FEM (TUBING) IMPLANT
WATER STERILE IRR 1000ML POUR (IV SOLUTION) ×3 IMPLANT

## 2014-10-20 NOTE — H&P (View-Only) (Signed)
Subjective:  PRE-OPERATIVE HISTORY AND PHYSICAL Date of surgery: 10/20/2014    Patient is a 49 y.o. G1P1054female scheduled for TAH/BSO through a midline incision. Indications for procedure are Endometriosis.  The patient has endometriosis diagnosed through laparoscopy with excision and fulguration of implants.  Pelvic adhesions were noted.  Patient has desired to proceed with definitive surgical management for this condition. Due to anatomic factors, including an anthropoid pelvis, obesity, moderate abdominal pannus, the recommendation is for her to undergo an abdominal hysterectomy through a midline incision.   Pertinent Gynecological History: Laparoscopy on 09/22/2014 demonstrated cul-de-sac endometriosis, adhesions, ovarian fossa, endosalpingosis Menses: flow is moderate Bleeding: dysfunctional uterine bleeding Contraception: tubal ligation Last mammogram: normal  Last pap: normal   Menstrual History: OB History    Gravida Para Term Preterm AB TAB SAB Ectopic Multiple Living   1 1 1       1      Patient's last menstrual period was 09/26/2014.    Past Medical History  Diagnosis Date  . High cholesterol   . GERD (gastroesophageal reflux disease)   . Anxiety   . Depression   . OCD (obsessive compulsive disorder)   . Chronic back pain   . Chronic constipation   . Severe obesity   . AR (allergic rhinitis)   . Trochanteric bursitis of right hip   . Tobacco use   . Hepatomegaly   . Dyslipidemia   . COPD (chronic obstructive pulmonary disease)   . Decreased dorsalis pedis pulse   . Fatty liver   . Chronic insomnia   . Benign hypertension   . Deaf   . Hot flashes   . Migraine with aura     Past Surgical History  Procedure Laterality Date  . Lipoma excision    . Knee arthroscopo Right   . Joint replacement Right   . Tonsillectomy    . Tubal ligation    . Laparoscopy  09/22/2014    Procedure: LAPAROSCOPY OPERATIVE;  Surgeon: Brayton Mars, MD;  Location: [facility]  ORS;  Service: Gynecology;;  excision and fulgeration of endomertriosis  . Foot surgery Bilateral   . Bone spurs removed Bilateral     OB History  Gravida Para Term Preterm AB SAB TAB Ectopic Multiple Living  1 1 1       1     # Outcome Date GA Lbr Len/2nd Weight Sex Delivery Anes PTL Lv  1 Term 1994   6 lb 4 oz (2.835 kg) M Vag-Spont   Y      History   Social History  . Marital Status: Divorced    Spouse Name: N/A  . Number of Children: N/A  . Years of Education: N/A   Social History Main Topics  . Smoking status: Current Every Day Smoker -- 0.50 packs/day for 34 years    Types: Cigarettes  . Smokeless tobacco: Never Used  . Alcohol Use: 0.0 oz/week    0 Standard drinks or equivalent per week     Comment: moderate use; h/o of heavy use. binges about once a month  . Drug Use: No     Comment: quit crack cocaine 2004  . Sexual Activity: Yes    Birth Control/ Protection: None   Other Topics Concern  . None   Social History Narrative    Family History  Problem Relation Age of Onset  . Diabetes Mother   . Heart disease Father   . Heart attack Father   . Diabetes Sister      (  Not in a hospital admission)  Allergies  Allergen Reactions  . Imitrex [Sumatriptan] Other (See Comments)    Chest pain    Review of Systems Constitutional: No recent fever/chills/sweats Respiratory: No recent cough/bronchitis Cardiovascular: No chest pain Gastrointestinal: No recent nausea/vomiting/diarrhea Genitourinary: No UTI symptoms Hematologic/lymphatic:No history of coagulopathy or recent blood thinner use    Objective:    BP 109/75 mmHg  Pulse 83  Ht 5\' 4"  (1.626 m)  Wt 204 lb 12.8 oz (92.897 kg)  BMI 35.14 kg/m2  LMP 09/26/2014  General:   Normal  Skin:   normal  HEENT:  Normal  Neck:  Supple without Adenopathy or Thyromegaly  Lungs:   Heart:              Breasts:   Abdomen:  Pelvis:  M/S   Extremeties:  Neuro:    clear to auscultation bilaterally   Normal  without murmur   Not Examined   soft, non-tender; bowel sounds normal; no masses,  no organomegaly   Exam deferred to OR  No CVAT  Warm/Dry   Normal          Assessment:      1.  Symptomatic endometriosis       Plan:   1.  TAH/BSO through a midline incision.  Preoperative counseling: The patient is to undergo TAH/BSO through a midline incision on 10/20/2014.  She is understanding of the planned procedure and is aware of and is accepting of all surgical risks which include but are not limited to bleeding, infection, pelvic organ injury, need for repair, blood clots disorders, anesthesia risks, etc.  Patient understands that she may need estrogen replacement therapy postoperatively for management of vasomotor symptoms and or for prevention of osteoporosis.  QUESTIONS have been answered.  Informed consent is given.  Patient is ready and willing to proceed with surgery as scheduled.  Brayton Mars, MD

## 2014-10-20 NOTE — Anesthesia Preprocedure Evaluation (Signed)
Anesthesia Evaluation  Patient identified by MRN, date of birth, ID band Patient awake    Reviewed: Allergy & Precautions, NPO status , Patient's Chart, lab work & pertinent test results  History of Anesthesia Complications Negative for: history of anesthetic complications  Airway Mallampati: II  TM Distance: >3 FB Neck ROM: Full    Dental no notable dental hx.    Pulmonary COPD COPD inhaler, Current Smoker,  Smokes 1ppd breath sounds clear to auscultation  Pulmonary exam normal       Cardiovascular hypertension, Pt. on medications Normal cardiovascular examRhythm:Regular Rate:Normal     Neuro/Psych  Headaches, PSYCHIATRIC DISORDERS Anxiety Depression Bipolar Disorder    GI/Hepatic Neg liver ROS, GERD-  Medicated and Controlled,  Endo/Other  negative endocrine ROS  Renal/GU negative Renal ROS  negative genitourinary   Musculoskeletal  (+) Arthritis -,   Abdominal   Peds negative pediatric ROS (+)  Hematology negative hematology ROS (+)   Anesthesia Other Findings   Reproductive/Obstetrics negative OB ROS                             Anesthesia Physical Anesthesia Plan  ASA: III  Anesthesia Plan: General   Post-op Pain Management:    Induction: Intravenous  Airway Management Planned: Oral ETT  Additional Equipment:   Intra-op Plan:   Post-operative Plan: Extubation in OR  Informed Consent: I have reviewed the patients History and Physical, chart, labs and discussed the procedure including the risks, benefits and alternatives for the proposed anesthesia with the patient or authorized representative who has indicated his/her understanding and acceptance.   Dental advisory given  Plan Discussed with: CRNA and Surgeon  Anesthesia Plan Comments:         Anesthesia Quick Evaluation

## 2014-10-20 NOTE — Interval H&P Note (Signed)
History and Physical Interval Note:  10/20/2014 7:26 AM  Wendy Johnson  has presented today for surgery, with the diagnosis of ENDOMETRIOSIS  The various methods of treatment have been discussed with the patient and family. After consideration of risks, benefits and other options for treatment, the patient has consented to  Procedure(s): HYSTERECTOMY ABDOMINAL/BSO (N/A) SALPINGO OOPHORECTOMY (Bilateral) as a surgical intervention .  The patient's history has been reviewed, patient examined, no change in status, stable for surgery.  I have reviewed the patient's chart and labs.  Questions were answered to the patient's satisfaction.     [name] A [name]   Date of Initial H&P:10/16/2014  History reviewed, patient examined, no change in status, stable for surgery.

## 2014-10-20 NOTE — Anesthesia Postprocedure Evaluation (Signed)
  Anesthesia Post-op Note  Patient: Wendy Johnson  Procedure(s) Performed: Procedure(s): Total abdominial hysterectomy, bilateral salpingo-oophorectomy (N/A)  Anesthesia type:General  Patient location: PACU  Post pain: Pain level controlled  Post assessment: Post-op Vital signs reviewed, Patient's Cardiovascular Status Stable, Respiratory Function Stable, Patent Airway and No signs of Nausea or vomiting  Post vital signs: Reviewed and stable  Last Vitals:  Filed Vitals:   10/20/14 1027  BP: 120/82  Pulse: 73  Temp:   Resp: 18    Level of consciousness: awake, alert  and patient cooperative  Complications: No apparent anesthesia complications

## 2014-10-20 NOTE — Anesthesia Procedure Notes (Signed)
Procedure Name: Intubation Date/Time: 10/20/2014 7:37 AM Performed by: Jonna Clark Pre-anesthesia Checklist: Patient identified, Emergency Drugs available, Suction available, Patient being monitored and Timeout performed Patient Re-evaluated:Patient Re-evaluated prior to inductionOxygen Delivery Method: Circle system utilized Preoxygenation: Pre-oxygenation with 100% oxygen Intubation Type: IV induction Ventilation: Mask ventilation without difficulty Laryngoscope Size: Mac and 3 Grade View: Grade I Tube type: Oral Tube size: 7.0 mm Number of attempts: 1 Placement Confirmation: ETT inserted through vocal cords under direct vision,  positive ETCO2 and breath sounds checked- equal and bilateral Secured at: 22 cm Tube secured with: Tape Dental Injury: Teeth and Oropharynx as per pre-operative assessment

## 2014-10-20 NOTE — Op Note (Signed)
OPERATIVE NOTE:  Wendy Johnson PROCEDURE DATE: 10/20/2014   PREOPERATIVE DIAGNOSIS: Endometriosis POSTOPERAT IVE DIAGNOSIS: Endometriosis  PROCEDURE: TAH/BSO SURGEON:  Brayton Mars, MD ASSISTANTS: Dr. Marcelline Mates ANESTHESIA: General INDICATIONS: 49 y.o. G1P1001 white female with history of endometriosis confirmed by laparoscopy biopsies. Patient desires definitive surgery.  FINDINGS:  Grossly normal-appearing uterus. Simple left ovarian cyst. Fallopian tubes demonstrated previous tubal ligation. Upper abdomen including diaphragm and liver normal.   I/O's: Total I/O In: 800 [I.V.:800] Out: 225 [Urine:200; Blood:25] COUNTS:  YES SPECIMENS: Uterus, fallopian tubes, ovaries ANTIBIOTIC PROPHYLAXIS:Ancef 2 grams COMPLICATIONS: None immediate  PROCEDURE IN DETAIL: The patient was brought to the operating room where she was placed in the supine position. General endotracheal anesthesia was induced without difficulty. A Foley catheter was placed and was draining clear yellow urine. A 4 prep and abdominal prep and drape was performed. Midline incision was made into the abdomen. Fascia was incised and the peritoneal cavity was entered. Balfour retractor was used to facilitate exposure. The bowel was packed off with wet laps. The uterus was grasped at the cornua with Kelly clamps. The LigaSure impact instrument was used to facilitate the hysterectomy. The round ligaments were grasped and coagulated and cut using the impact instrument. Ureters were identified bilaterally out of harm's way. The LigaSure instrument was then used clamped coagulated and transected the pelvic ligaments. The anterior posterior leafs of the broad ligament were opened and the uterine arteries were skeletonized. Bladder flap was taken down through sharp dissection. The LigaSure instrument was used to take down the cardinal broad ligament complex sequentially right clamping whitely cutting technique and this was carried down  level cervicovaginal junction. At this point the cervicovaginal junction was clamped with curved Heaney clamps and scissors were used to resect the final pedicles to remove the specimen from the operative field. Angles of the vagina closed using a 0 Vicryl suture in a Richardson technique. This was followed by closure of the vagina with figure-of-eight sutures of 0 Vicryl. Pelvis was then copiously irrigated. All pedicles were noted to be intact. Incision was then closed in layers following removal of all instruments from the abdominal pelvic cavity. The fascia was closed with 0 Maxon in a simple running manner. Subcutaneous tissues were reapproximated using a simple running suture of 2-0 Vicryl. The skin was closed with staples. Lidoderm patch was applied to the skin for analgesia. Telfa and 4 x 4 dressing was then placed over the incision. Patient was then awakened and taken in satisfactory condition to the recovery room.  Martin A. Zipporah Plants, MD, ACOG [facility]

## 2014-10-20 NOTE — Transfer of Care (Signed)
Immediate Anesthesia Transfer of Care Note  Patient: Wendy Johnson  Procedure(s) Performed: Procedure(s): Total abdominial hysterectomy, bilateral salpingo-oophorectomy (N/A)  Patient Location: PACU  Anesthesia Type:General  Level of Consciousness: awake, alert  and oriented  Airway & Oxygen Therapy: Patient Spontanous Breathing and Patient connected to face mask oxygen  Post-op Assessment: Report given to RN and Post -op Vital signs reviewed and stable  Post vital signs: Reviewed and stable  Last Vitals:  Filed Vitals:   10/20/14 0920  BP: 137/95  Pulse: 74  Temp: 36.4 C  Resp: 20    Complications: No apparent anesthesia complications

## 2014-10-21 LAB — HEMOGLOBIN: HEMOGLOBIN: 12.6 g/dL (ref 12.0–16.0)

## 2014-10-21 LAB — SURGICAL PATHOLOGY

## 2014-10-21 MED ORDER — ZINC OXIDE 40 % EX OINT
TOPICAL_OINTMENT | Freq: Every day | CUTANEOUS | Status: DC | PRN
  Filled 2014-10-21: qty 114

## 2014-10-21 MED ORDER — OXYCODONE HCL 5 MG PO TABS
10.0000 mg | ORAL_TABLET | ORAL | Status: DC | PRN
  Administered 2014-10-21 – 2014-10-22 (×4): 10 mg via ORAL
  Filled 2014-10-21 (×4): qty 2

## 2014-10-21 MED ORDER — DIPHENHYDRAMINE HCL 25 MG PO CAPS
25.0000 mg | ORAL_CAPSULE | ORAL | Status: DC | PRN
  Administered 2014-10-21: 25 mg via ORAL
  Filled 2014-10-21: qty 1

## 2014-10-21 MED ORDER — IBUPROFEN 600 MG PO TABS: 600.0000 mg | ORAL_TABLET | Freq: Four times a day (QID) | ORAL | Status: DC | PRN

## 2014-10-21 NOTE — Progress Notes (Signed)
Pt complains of itching and hives covering back, abdomen, arms, and legs; Also complains of "burning" on inner buttocks with redness and small superficial blisters noted.  Dr. Enzo Bi contacted.  Orders received for PO Benadryl and Desitin.   Benadryl administered. Night shift RN shown rash and will continue to monitor. Reed Breech, RN 10/21/2014 7:30 PM

## 2014-10-21 NOTE — Progress Notes (Signed)
1 Day Post-Op Procedure(s): Total abdominial hysterectomy, bilateral salpingo-oophorectomy (N/A)  Subjective: Patient reports tolerating PO and no problems voiding.    Objective: I have reviewed patient's vital signs, intake and output, medications and labs.  General: alert and cooperative GI: soft, non-tender; bowel sounds normal; no masses,  no organomegaly, normal findings: soft, non-tender and incision: clean, dry and intact    Assessment: s/p Procedure(s): Total abdominial hysterectomy, bilateral salpingo-oophorectomy (N/A): stable  Plan: Advance diet Encourage ambulation Advance to PO medication  LOS: 1 day    Wendy Johnson Hey 10/21/2014, 12:39 PM

## 2014-10-22 DIAGNOSIS — N809 Endometriosis, unspecified: Principal | ICD-10-CM

## 2014-10-22 MED ORDER — DOCUSATE SODIUM 100 MG PO CAPS: 100.0000 mg | ORAL_CAPSULE | Freq: Two times a day (BID) | ORAL | Status: DC

## 2014-10-22 MED ORDER — ARIPIPRAZOLE 15 MG PO TABS
15.0000 mg | ORAL_TABLET | Freq: Every day | ORAL | Status: DC
  Filled 2014-10-22: qty 1

## 2014-10-22 MED ORDER — OXYCODONE HCL 10 MG PO TABS: 10.0000 mg | ORAL_TABLET | ORAL | Status: DC | PRN

## 2014-10-22 NOTE — Discharge Summary (Signed)
Physician Discharge Summary  Patient ID: Wendy Johnson MRN: [id number] DOB/AGE: 47-Nov-1967 47 y.o.  Admit date: 10/20/2014 Discharge date: 10/22/2014  Admission Diagnoses: Chronic Pelvic Pain and Endometriosis  Discharge Diagnoses:  Chronic Pelvic Pain and Endometriosis  Operative Procedures: Procedure(s): Total abdominial hysterectomy, bilateral salpingo-oophorectomy (N/A)  Hospital Course: Uncomplicated   Significant Diagnostic Studies:  Lab Results  Component Value Date   HGB 12.6 10/21/2014   HGB 13.6 10/16/2014   HGB 13.8 09/17/2014   Lab Results  Component Value Date   HCT 41.7 10/16/2014   HCT 41.5 09/17/2014   HCT 44.6 04/29/2014   CBC Latest Ref Rng 10/21/2014 10/16/2014 09/17/2014  WBC 3.6 - 11.0 K/uL - 6.4 7.0  Hemoglobin 12.0 - 16.0 g/dL 12.6 13.6 13.8  Hematocrit 35.0 - 47.0 % - 41.7 41.5  Platelets 150 - 440 K/uL - 198 195     Discharged Condition: good  Discharge Exam: Blood pressure 120/68, pulse 88, temperature 98.9 F (37.2 C), temperature source Oral, resp. rate 20, height 5\' 4"  (1.626 m), weight 204 lb (92.534 kg), last menstrual period 09/26/2014, SpO2 92 %. Incision/Wound: clean, dry and no drainage  Disposition: 01-Home or Self Care  Discharge Instructions    Call MD for:  difficulty breathing, headache or visual disturbances    Complete by:  As directed      Call MD for:  extreme fatigue    Complete by:  As directed      Call MD for:  hives    Complete by:  As directed      Call MD for:  persistant dizziness or light-headedness    Complete by:  As directed      Call MD for:  persistant nausea and vomiting    Complete by:  As directed      Call MD for:  redness, tenderness, or signs of infection (pain, swelling, redness, odor or green/yellow discharge around incision site)    Complete by:  As directed      Call MD for:  severe uncontrolled pain    Complete by:  As directed      Call MD for:  temperature >100.4    Complete by:  As  directed      Diet - low sodium heart healthy    Complete by:  As directed      Discharge wound care:    Complete by:  As directed   Keep incision clean and dry. OK to shower. Call for Drainage, redness, or separation of incision.     Increase activity slowly    Complete by:  As directed      No dressing needed    Complete by:  As directed             Medication List    STOP taking these medications        oxyCODONE-acetaminophen 5-325 MG per tablet  Commonly known as:  ROXICET      TAKE these medications        amphetamine-dextroamphetamine 30 MG tablet  Commonly known as:  ADDERALL  Take 30 mg by mouth 2 (two) times daily.     ARIPiprazole 15 MG tablet  Commonly known as:  ABILIFY  Take 15 mg by mouth daily.     baclofen 20 MG tablet  Commonly known as:  LIORESAL  Take 20 mg by mouth every 6 (six) hours as needed for muscle spasms.     busPIRone 10 MG tablet  Commonly known as:  BUSPAR  Take 20 mg by mouth 2 (two) times daily.     clonazePAM 1 MG tablet  Commonly known as:  KLONOPIN  Take 1-2 mg by mouth 3 (three) times daily as needed for anxiety.     dexlansoprazole 60 MG capsule  Commonly known as:  DEXILANT  Take 60 mg by mouth daily.     docusate sodium 100 MG capsule  Commonly known as:  COLACE  Take 1 capsule (100 mg total) by mouth 2 (two) times daily.     gabapentin 100 MG capsule  Commonly known as:  NEURONTIN  Take 200-300 mg by mouth 3 (three) times daily. 3 caps in the morning and 2 caps in the afternoon and night     hydrochlorothiazide 12.5 MG capsule  Commonly known as:  MICROZIDE  Take 1 capsule by mouth daily.     ibuprofen 800 MG tablet  Commonly known as:  ADVIL,MOTRIN  Take 800 mg by mouth every 8 (eight) hours as needed for moderate pain.     multivitamin with minerals Tabs tablet  Take 1 tablet by mouth daily.     Oxycodone HCl 10 MG Tabs  Take 1 tablet (10 mg total) by mouth every 4 (four) hours as needed for moderate  pain or severe pain.     pravastatin 40 MG tablet  Commonly known as:  PRAVACHOL  Take 40 mg by mouth daily.     PROAIR HFA 108 (90 BASE) MCG/ACT inhaler  Generic drug:  albuterol  Inhale 2 puffs into the lungs every 6 (six) hours as needed.     sertraline 50 MG tablet  Commonly known as:  ZOLOFT  Take 100 mg by mouth 2 (two) times daily.     tiotropium 18 MCG inhalation capsule  Commonly known as:  SPIRIVA  Place 18 mcg into inhaler and inhale daily.     traMADol 50 MG tablet  Commonly known as:  ULTRAM  Take 1 tablet by mouth every 6 (six) hours as needed.     traZODone 150 MG tablet  Commonly known as:  DESYREL  Take 300 mg by mouth at bedtime as needed for sleep.         SignedAlanda Slim Davon Folta 10/22/2014, 1:06 PM

## 2014-10-22 NOTE — Discharge Instructions (Signed)
Abdominal Hysterectomy, Care After °These instructions give you information on caring for yourself after your procedure. Your doctor may also give you more specific instructions. Call your doctor if you have any problems or questions after your procedure.  °HOME CARE °It takes 4-6 weeks to recover from this surgery. Follow all of your doctor's instructions.  °· Only take medicines as told by your doctor. °· Change your bandage as told by your doctor. °· Return to your doctor to have your stitches taken out. °· Take showers for 2-3 weeks. Ask your doctor when it is okay to shower. °· Do not douche, use tampons, or have sex (intercourse) for at least 6 weeks or as told. °· Follow your doctor's advice about exercise, lifting objects, driving, and general activities. °· Get plenty of rest and sleep. °· Do not lift anything heavier than a gallon of milk (about 10 pounds [4.5 kilograms]) for the first month after surgery. °· Get back to your normal diet as told by your doctor. °· Do not drink alcohol until your doctor says it is okay. °· Take a medicine to help you poop (laxative) as told by your doctor. °· Eating foods high in fiber may help you poop. Eat a lot of raw fruits and vegetables, whole grains, and beans. °· Drink enough fluids to keep your pee (urine) clear or pale yellow. °· Have someone help you at home for 1-2 weeks after your surgery. °· Keep follow-up doctor visits as told. °GET HELP IF: °· You have chills or fever. °· You have puffiness, redness, or pain in area of the cut (incision). °· You have yellowish-white fluid (pus) coming from the cut. °· You have a bad smell coming from the cut or bandage. °· Your cut pulls apart. °· You feel dizzy or light-headed. °· You have pain or bleeding when you pee. °· You keep having watery poop (diarrhea). °· You keep feeling sick to your stomach (nauseous) or keep throwing up (vomiting). °· You have fluid (discharge) coming from your vagina. °· You have a  rash. °· You have a reaction to your medicine. °· You need stronger pain medicine. °GET HELP RIGHT AWAY IF:  °· You have a fever and your symptoms suddenly get worse. °· You have bad belly (abdominal) pain. °· You have chest pain. °· You are short of breath. °· You pass out (faint). °· You have pain, puffiness, or redness of your leg. °· You bleed a lot from your vagina and notice clumps of tissue (clots). °MAKE SURE YOU:  °· Understand these instructions. °· Will watch your condition. °· Will get help right away if you are not doing well or get worse. °Document Released: 02/02/2008 Document Revised: 04/30/2013 Document Reviewed: 02/15/2013 °[employer]® Patient Information ©2015 [employer]. This information is not intended to replace advice given to you by your health care provider. Make sure you discuss any questions you have with your health care provider. ° °

## 2014-10-22 NOTE — Progress Notes (Signed)
Reviewed discharge instructions with patient and family member.  Verb u/o.  Staples remain intact and will be taken out at f/u appt. Perscriptions given to patient.

## 2014-10-23 ENCOUNTER — Other Ambulatory Visit: Payer: Self-pay | Admitting: Pain Medicine

## 2014-10-27 ENCOUNTER — Other Ambulatory Visit: Payer: Self-pay | Admitting: Obstetrics and Gynecology

## 2014-10-28 ENCOUNTER — Ambulatory Visit (INDEPENDENT_AMBULATORY_CARE_PROVIDER_SITE_OTHER): Payer: Medicare Other | Admitting: Obstetrics and Gynecology

## 2014-10-28 ENCOUNTER — Encounter: Payer: Self-pay | Admitting: Obstetrics and Gynecology

## 2014-10-28 VITALS — BP 104/71 | HR 81 | Ht 64.0 in | Wt 200.2 lb

## 2014-10-28 DIAGNOSIS — Z842 Family history of other diseases of the genitourinary system: Secondary | ICD-10-CM

## 2014-10-28 DIAGNOSIS — G8918 Other acute postprocedural pain: Secondary | ICD-10-CM

## 2014-10-28 DIAGNOSIS — Z9889 Other specified postprocedural states: Secondary | ICD-10-CM

## 2014-10-28 MED ORDER — FLUCONAZOLE 150 MG PO TABS: 150.0000 mg | ORAL_TABLET | Freq: Once | ORAL | Status: DC

## 2014-10-28 MED ORDER — OXYCODONE HCL 10 MG PO TABS: 10.0000 mg | ORAL_TABLET | ORAL | Status: DC | PRN

## 2014-10-28 NOTE — Progress Notes (Signed)
Patient ID: Wendy Johnson, female   DOB: [birth year], 49 y.o.   MRN: [id number] GYN ENCOUNTER NOTE  Subjective:       ARIABELLA Johnson is a 49 y.o. G62P1001 female is here for gynecologic evaluation of the following issues:  1. ABDOMINAL PAIN SCALE 7-10 2. C/O VAGINAL ITCHING, NO DISCHARGE 3. REFILL PAIN MEDICATION 4. ?ESTROGEN REPLACEMENT    STAPLES REMOVED, AREA SLIGHTLY RED, INCISION CLOSED, STERI-STRIPS APPLIED. 5.  One week status post TAH/BSO.  Gynecologic History Patient's last menstrual period was 09/26/2014. Contraception: TAH/BSO Last Pap: Results were:2013 NORMAL Last mammogram: Results were: NORMAL 2015  Obstetric History OB History  Gravida Para Term Preterm AB SAB TAB Ectopic Multiple Living  1 1 1       1     # Outcome Date GA Lbr Len/2nd Weight Sex Delivery Anes PTL Lv  1 Term 1994   6 lb 4 oz (2.835 kg) M Vag-Spont   Y      Past Medical History  Diagnosis Date  . High cholesterol   . GERD (gastroesophageal reflux disease)   . Anxiety   . Depression   . OCD (obsessive compulsive disorder)   . Chronic back pain   . Chronic constipation   . Severe obesity   . AR (allergic rhinitis)   . Trochanteric bursitis of right hip   . Tobacco use   . Hepatomegaly   . Dyslipidemia   . COPD (chronic obstructive pulmonary disease)   . Decreased dorsalis pedis pulse   . Fatty liver   . Chronic insomnia   . Benign hypertension   . Deaf   . Hot flashes   . Migraine with aura     Past Surgical History  Procedure Laterality Date  . Lipoma excision    . Knee arthroscopo Right   . Joint replacement Right   . Tonsillectomy    . Tubal ligation    . Laparoscopy  09/22/2014    Procedure: LAPAROSCOPY OPERATIVE;  Surgeon: Brayton Mars, MD;  Location: [facility] ORS;  Service: Gynecology;;  excision and fulgeration of endomertriosis  . Foot surgery Bilateral   . Bone spurs removed Bilateral   . Abdominal hysterectomy N/A 10/20/2014    Procedure: Total abdominial  hysterectomy, bilateral salpingo-oophorectomy;  Surgeon: Brayton Mars, MD;  Location: [facility] ORS;  Service: Gynecology;  Laterality: N/A;  . Bilateral salpingoophorectomy      Current Outpatient Prescriptions on File Prior to Visit  Medication Sig Dispense Refill  . albuterol (PROAIR HFA) 108 (90 BASE) MCG/ACT inhaler Inhale 2 puffs into the lungs every 6 (six) hours as needed.    Marland Kitchen amphetamine-dextroamphetamine (ADDERALL) 30 MG tablet Take 30 mg by mouth 2 (two) times daily.    . ARIPiprazole (ABILIFY) 15 MG tablet Take 15 mg by mouth daily.    . baclofen (LIORESAL) 20 MG tablet Take 20 mg by mouth every 6 (six) hours as needed for muscle spasms.     . busPIRone (BUSPAR) 10 MG tablet Take 20 mg by mouth 2 (two) times daily.     . clonazePAM (KLONOPIN) 1 MG tablet Take 1-2 mg by mouth 3 (three) times daily as needed for anxiety.    Marland Kitchen dexlansoprazole (DEXILANT) 60 MG capsule Take 60 mg by mouth daily.    Marland Kitchen docusate sodium (COLACE) 100 MG capsule Take 1 capsule (100 mg total) by mouth 2 (two) times daily. 10 capsule 0  . gabapentin (NEURONTIN) 100 MG capsule Take 200-300 mg by mouth 3 (three)  times daily. 3 caps in the morning and 2 caps in the afternoon and night    . hydrochlorothiazide (MICROZIDE) 12.5 MG capsule Take 1 capsule by mouth daily.    . Multiple Vitamin (MULTIVITAMIN WITH MINERALS) TABS tablet Take 1 tablet by mouth daily.    Marland Kitchen oxyCODONE 10 MG TABS Take 1 tablet (10 mg total) by mouth every 4 (four) hours as needed for moderate pain or severe pain. 30 tablet 0  . pravastatin (PRAVACHOL) 40 MG tablet Take 40 mg by mouth daily.    . sertraline (ZOLOFT) 50 MG tablet Take 100 mg by mouth 2 (two) times daily.     Marland Kitchen tiotropium (SPIRIVA) 18 MCG inhalation capsule Place 18 mcg into inhaler and inhale daily.    . traMADol (ULTRAM) 50 MG tablet Take 1 tablet by mouth every 6 (six) hours as needed.    . traZODone (DESYREL) 150 MG tablet Take 300 mg by mouth at bedtime as needed for  sleep.     Marland Kitchen ibuprofen (ADVIL,MOTRIN) 800 MG tablet Take 800 mg by mouth every 8 (eight) hours as needed for moderate pain.      No current facility-administered medications on file prior to visit.    Allergies  Allergen Reactions  . Imitrex [Sumatriptan] Other (See Comments)    Chest pain    History   Social History  . Marital Status: Divorced    Spouse Name: N/A  . Number of Children: N/A  . Years of Education: N/A   Occupational History  . Not on file.   Social History Main Topics  . Smoking status: Current Every Day Smoker -- 0.50 packs/day for 34 years    Types: Cigarettes  . Smokeless tobacco: Never Used  . Alcohol Use: 0.0 oz/week    0 Standard drinks or equivalent per week     Comment: moderate use; h/o of heavy use. binges about once a month  . Drug Use: No     Comment: quit crack cocaine 2004  . Sexual Activity: Yes    Birth Control/ Protection: None   Other Topics Concern  . Not on file   Social History Narrative    Family History  Problem Relation Age of Onset  . Diabetes Mother   . Heart disease Father   . Heart attack Father   . Diabetes Sister     The following portions of the patient's history were reviewed and updated as appropriate: allergies, current medications, past family history, past medical history, past social history, past surgical history and problem list.  Review of Systems Review of Systems - General ROS: negative for - chills, fatigue, fever, hot flashes, malaise or night sweats Hematological and Lymphatic ROS: negative for - bleeding problems or swollen lymph nodes Gastrointestinal ROS: negative for -  blood in stools, change in bowel habits and nausea/vomiting.  POSITIVE-abdominal pain, Musculoskeletal ROS: negative for - joint pain, muscle pain or muscular weakness Genito-Urinary ROS: negative for - change in menstrual cycle, dysmenorrhea, dyspareunia, dysuria, genital discharge, genital ulcers, hematuria, incontinence,  irregular/heavy menses, nocturia or pelvic pain.POSITIVE-vulvar itching  Objective:   BP 104/71 mmHg  Pulse 81  Ht 5\' 4"  (1.626 m)  Wt 200 lb 4 oz (90.833 kg)  BMI 34.36 kg/m2  LMP 09/26/2014 CONSTITUTIONAL: Well-developed, well-nourished female in no acute distress.  HE NT:  Normocephalic, atraumatic.  SKIN: Skin is warm and dry. No rash noted. Not diaphoretic. No erythema. No pallor. NEUROLOGIC: Alert and oriented to person, place, and time. PSYCHIATRIC: Normal  mood and affect. Normal behavior. Normal judgment and thought content. CARDIOVASCULAR:Not Examined RESPIRATORY: Not Examined BREASTS: Not Examined ABDOMEN: Soft, non distended; Non tender.  No Organomegaly; midline incision is well approximated; Steri-Strips applied and staples removed PELVIC:  External Genitalia: mild hyperemia; no skin lesions/satellite lesions.  BUS: Normal  Vagina: Normal  Cervix: surgically absent  Uterus: surgically absent  Bimanual exam: Not done  Bladder: Non tender MUSCULOSKELETAL: Normal range of motion. No tenderness.  No cyanosis, clubbing, or edema.     Assessment:  1.  One week status post TAH/BSO for symptomatic endometriosis 2.  Vulvar itching, possibly consistent with monilia  PLAN: 1.  Roxicodone refilled. 2.  Continue with postop precautions. 3.  Diflucan 150 mg by mouth 4.  Return in 5 weeks for final postop check

## 2014-10-30 ENCOUNTER — Telehealth: Payer: Self-pay | Admitting: Family Medicine

## 2014-10-30 ENCOUNTER — Other Ambulatory Visit: Payer: Self-pay | Admitting: Family Medicine

## 2014-10-30 NOTE — Telephone Encounter (Signed)
PT NEEDS RX REFILLED FOR DEXILANT AND THE PHARMACY HAS CALLED SEVERAL TIMES SHE SAID. SHE IS OUT AND NEEDS THESE BAD.PHARM IS [facility].

## 2014-10-31 ENCOUNTER — Telehealth: Payer: Self-pay | Admitting: Obstetrics and Gynecology

## 2014-10-31 NOTE — Telephone Encounter (Signed)
errenous encounter °

## 2014-10-31 NOTE — Telephone Encounter (Signed)
Patient is s/p hysterectomy on 10/20/14-she was filling well and vacuumed her house. Today she is in pain and spotting. She is requesting enough pain meds to get her through until she sees pain management on Monday. Thanks

## 2014-10-31 NOTE — Telephone Encounter (Signed)
Per mad pt can not have any more pain meds. She is to use ibup and tylenol. Advised pt to rest. Spotting is minimal. Pt thinks she over did it yesterday.

## 2014-11-03 ENCOUNTER — Ambulatory Visit: Payer: Medicare Other | Admitting: Anesthesiology

## 2014-11-11 ENCOUNTER — Ambulatory Visit: Payer: Medicare Other | Attending: Anesthesiology | Admitting: Anesthesiology

## 2014-11-11 ENCOUNTER — Encounter: Payer: Self-pay | Admitting: Anesthesiology

## 2014-11-11 VITALS — BP 110/62 | HR 76 | Temp 97.8°F | Resp 18 | Ht 64.0 in | Wt 188.0 lb

## 2014-11-11 DIAGNOSIS — M150 Primary generalized (osteo)arthritis: Secondary | ICD-10-CM

## 2014-11-11 DIAGNOSIS — M5136 Other intervertebral disc degeneration, lumbar region: Secondary | ICD-10-CM | POA: Insufficient documentation

## 2014-11-11 DIAGNOSIS — M1990 Unspecified osteoarthritis, unspecified site: Secondary | ICD-10-CM | POA: Diagnosis not present

## 2014-11-11 DIAGNOSIS — G545 Neuralgic amyotrophy: Secondary | ICD-10-CM | POA: Diagnosis present

## 2014-11-11 DIAGNOSIS — M25562 Pain in left knee: Secondary | ICD-10-CM | POA: Diagnosis not present

## 2014-11-11 DIAGNOSIS — M159 Polyosteoarthritis, unspecified: Secondary | ICD-10-CM

## 2014-11-11 DIAGNOSIS — M8949 Other hypertrophic osteoarthropathy, multiple sites: Secondary | ICD-10-CM | POA: Insufficient documentation

## 2014-11-11 DIAGNOSIS — F418 Other specified anxiety disorders: Secondary | ICD-10-CM | POA: Insufficient documentation

## 2014-11-11 DIAGNOSIS — F1190 Opioid use, unspecified, uncomplicated: Secondary | ICD-10-CM | POA: Diagnosis not present

## 2014-11-11 DIAGNOSIS — M47816 Spondylosis without myelopathy or radiculopathy, lumbar region: Secondary | ICD-10-CM | POA: Insufficient documentation

## 2014-11-11 DIAGNOSIS — G8929 Other chronic pain: Secondary | ICD-10-CM | POA: Insufficient documentation

## 2014-11-11 DIAGNOSIS — M545 Low back pain, unspecified: Secondary | ICD-10-CM

## 2014-11-11 DIAGNOSIS — F319 Bipolar disorder, unspecified: Secondary | ICD-10-CM | POA: Diagnosis not present

## 2014-11-11 DIAGNOSIS — G8918 Other acute postprocedural pain: Secondary | ICD-10-CM

## 2014-11-11 DIAGNOSIS — F411 Generalized anxiety disorder: Secondary | ICD-10-CM | POA: Insufficient documentation

## 2014-11-11 DIAGNOSIS — Z842 Family history of other diseases of the genitourinary system: Secondary | ICD-10-CM

## 2014-11-11 MED ORDER — OXYCODONE HCL 10 MG PO TABS: 10.0000 mg | ORAL_TABLET | ORAL | Status: DC | PRN

## 2014-11-11 NOTE — Progress Notes (Signed)
Safety precautions to be maintained throughout the outpatient stay will include: orient to surroundings, keep bed in low position, maintain call bell within reach at all times, provide assistance with transfer out of bed and ambulation.  

## 2014-11-12 ENCOUNTER — Encounter: Payer: Self-pay | Admitting: Obstetrics and Gynecology

## 2014-11-12 ENCOUNTER — Ambulatory Visit (INDEPENDENT_AMBULATORY_CARE_PROVIDER_SITE_OTHER): Payer: Medicare Other | Admitting: Obstetrics and Gynecology

## 2014-11-12 ENCOUNTER — Telehealth: Payer: Self-pay | Admitting: Obstetrics and Gynecology

## 2014-11-12 VITALS — BP 109/74 | HR 73 | Ht 64.0 in | Wt 192.3 lb

## 2014-11-12 DIAGNOSIS — Z09 Encounter for follow-up examination after completed treatment for conditions other than malignant neoplasm: Secondary | ICD-10-CM

## 2014-11-12 DIAGNOSIS — R102 Pelvic and perineal pain: Secondary | ICD-10-CM | POA: Diagnosis not present

## 2014-11-12 DIAGNOSIS — N809 Endometriosis, unspecified: Secondary | ICD-10-CM

## 2014-11-12 DIAGNOSIS — Z23 Encounter for immunization: Secondary | ICD-10-CM | POA: Diagnosis not present

## 2014-11-12 LAB — POCT URINALYSIS DIPSTICK
Bilirubin, UA: NEGATIVE
GLUCOSE UA: NEGATIVE
Ketones, UA: NEGATIVE
Nitrite, UA: NEGATIVE
Protein, UA: NEGATIVE
RBC UA: POSITIVE
Spec Grav, UA: <=1.005
UROBILINOGEN UA: 0.2
pH, UA: 6

## 2014-11-12 MED ORDER — ESTRADIOL 1 MG PO TABS: 1.0000 mg | ORAL_TABLET | Freq: Every day | ORAL | Status: DC

## 2014-11-12 NOTE — Progress Notes (Signed)
Patient ID: Wendy Johnson, female   DOB: 04/10/1966, 49 y.o.   MRN: [id number]   Pt c/o of RLQ pain x 3 days Vaginal discharge- x 3 weeks- pos odor, green   GYN ENCOUNTER NOTE  Subjective:       Wendy Johnson is a 49 y.o. G72P1001 female is here for gynecologic evaluation of the following issues:  1. Vaginal discharge--green and malodorous x 2.5 weeks 2. Right lower quadrant abdominal pain x 3 days  The vaginal discharge is green and malodorous. Patient changes pads at least 5 times a day d/t odor. She is not soaking the pads. The odor is bothering her a lot. She also has sharp stabbing pain to the right of her TAH incision, ranging from 7/10 to 10/10 for 3 days. Nothing makes it better or worse, it does not radiate, and no timing pattern.     ~EGM, PA-S  Gynecologic History Patient's last menstrual period was 09/26/2014. Contraception: Patient is s/p hysterectomy.   Obstetric History OB History  Gravida Para Term Preterm AB SAB TAB Ectopic Multiple Living  1 1 1       1     # Outcome Date GA Lbr Len/2nd Weight Sex Delivery Anes PTL Lv  1 Term 1994   6 lb 4 oz (2.835 kg) M Vag-Spont   Y      Past Medical History  Diagnosis Date  . High cholesterol   . GERD (gastroesophageal reflux disease)   . Anxiety   . Depression   . OCD (obsessive compulsive disorder)   . Chronic back pain   . Chronic constipation   . Severe obesity   . AR (allergic rhinitis)   . Trochanteric bursitis of right hip   . Tobacco use   . Hepatomegaly   . Dyslipidemia   . COPD (chronic obstructive pulmonary disease)   . Decreased dorsalis pedis pulse   . Fatty liver   . Chronic insomnia   . Benign hypertension   . Deaf   . Hot flashes   . Migraine with aura     Past Surgical History  Procedure Laterality Date  . Lipoma excision    . Knee arthroscopo Right   . Joint replacement Right   . Tonsillectomy    . Tubal ligation    . Laparoscopy  09/22/2014    Procedure: LAPAROSCOPY OPERATIVE;   Surgeon: Brayton Mars, MD;  Location: [facility];  Service: Gynecology;;  excision and fulgeration of endomertriosis  . Foot surgery Bilateral   . Bone spurs removed Bilateral   . Abdominal hysterectomy N/A 10/20/2014    Procedure: Total abdominial hysterectomy, bilateral salpingo-oophorectomy;  Surgeon: Brayton Mars, MD;  Location: [facility];  Service: Gynecology;  Laterality: N/A;  . Bilateral salpingoophorectomy      Current Outpatient Prescriptions on File Prior to Visit  Medication Sig Dispense Refill  . albuterol (PROAIR HFA) 108 (90 BASE) MCG/ACT inhaler Inhale 2 puffs into the lungs every 6 (six) hours as needed.    Marland Kitchen amphetamine-dextroamphetamine (ADDERALL) 30 MG tablet Take 30 mg by mouth 2 (two) times daily.    . ARIPiprazole (ABILIFY) 15 MG tablet Take 15 mg by mouth daily.    . baclofen (LIORESAL) 20 MG tablet Take 20 mg by mouth every 6 (six) hours as needed for muscle spasms.     . busPIRone (BUSPAR) 10 MG tablet Take 20 mg by mouth 2 (two) times daily.     . clonazePAM (KLONOPIN) 1 MG tablet Take  1-2 mg by mouth 3 (three) times daily as needed for anxiety.    . DEXILANT 60 MG capsule TAKE ONE (1) CAPSULE EACH DAY FOR GERD 30 capsule 5  . docusate sodium (COLACE) 100 MG capsule Take 1 capsule (100 mg total) by mouth 2 (two) times daily. 10 capsule 0  . fluconazole (DIFLUCAN) 150 MG tablet Take 1 tablet (150 mg total) by mouth once. 1 tablet 0  . gabapentin (NEURONTIN) 100 MG capsule Take 200-300 mg by mouth 3 (three) times daily. 3 caps in the morning and 2 caps in the afternoon and night    . hydrochlorothiazide (MICROZIDE) 12.5 MG capsule Take 1 capsule by mouth daily.    Marland Kitchen ibuprofen (ADVIL,MOTRIN) 800 MG tablet Take 800 mg by mouth every 8 (eight) hours as needed for moderate pain.     . Multiple Vitamin (MULTIVITAMIN WITH MINERALS) TABS tablet Take 1 tablet by mouth daily.    . Oxycodone HCl 10 MG TABS Take 1 tablet (10 mg total) by mouth every 4 (four) hours as  needed (one half tab qid is acceptable). 60 tablet 0  . pravastatin (PRAVACHOL) 40 MG tablet Take 40 mg by mouth daily.    . sertraline (ZOLOFT) 50 MG tablet Take 100 mg by mouth 2 (two) times daily.     Marland Kitchen tiotropium (SPIRIVA) 18 MCG inhalation capsule Place 18 mcg into inhaler and inhale daily.    . traMADol (ULTRAM) 50 MG tablet Take 1 tablet by mouth every 6 (six) hours as needed.    . traZODone (DESYREL) 150 MG tablet Take 300 mg by mouth at bedtime as needed for sleep.      No current facility-administered medications on file prior to visit.    Allergies  Allergen Reactions  . Imitrex [Sumatriptan] Other (See Comments)    Chest pain    History   Social History  . Marital Status: Divorced    Spouse Name: N/A  . Number of Children: N/A  . Years of Education: N/A   Occupational History  . Not on file.   Social History Main Topics  . Smoking status: Current Every Day Smoker -- 0.50 packs/day for 34 years    Types: Cigarettes  . Smokeless tobacco: Never Used  . Alcohol Use: 0.0 oz/week    0 Standard drinks or equivalent per week     Comment: moderate use; h/o of heavy use. binges about once a month  . Drug Use: No     Comment: quit crack cocaine 2004  . Sexual Activity: Yes    Birth Control/ Protection: None   Other Topics Concern  . Not on file   Social History Narrative    Family History  Problem Relation Age of Onset  . Diabetes Mother   . Heart disease Father   . Heart attack Father   . Diabetes Sister     The following portions of the patient's history were reviewed and updated as appropriate: allergies, current medications, past family history, past medical history, past social history, past surgical history and problem list.  Review of Systems Review of Systems - General ROS: positive for hot flashes and night sweats; negative for fever Cardiovascular: negative for chest pain Respiratory: negative for SOB Gastrointestinal ROS: positive for right lower  quadrant pain; negative for - blood in stools, change in bowel habits and nausea/vomiting Genito-Urinary ROS: positive for vaginal discharge, dysuria, negative for - bleeding, hemorrhoids, itching  Objective:   BP 109/74 mmHg  Pulse 73  Ht  5\' 4"  (1.626 m)  Wt 192 lb 4.8 oz (87.227 kg)  BMI 32.99 kg/m2  LMP 09/26/2014 CONSTITUTIONAL: Well-developed, well-nourished female in mild distress  HENT:  Normocephalic, atraumatic.  SKIN: Skin is warm and dry. No rash noted. Not diaphoretic. No erythema. No pallor. Crestline: Alert and oriented to person, place, and time. PSYCHIATRIC: Normal mood and affect. Normal behavior. Normal judgment and thought content. CARDIOVASCULAR: S1 and S2 present, no murmur, rub, or gallop. RESPIRATORY: CTAB, normal respiratory effort. BREASTS: Not Examined ABDOMEN: Soft, non distended;Midline incision is well approximated without evidence of drainage, erythema or induration.  There is asymmetry of the soft tissues of the abdominal wall, right greater than left, without evidence of hernia or abscess.  No Organomegaly. PELVIC:  External Genitalia: Normal  BUS: Normal  Vagina: Normal Vault support; vaginal cuff shows minimal granulation tissue and dissolving suture; minimal white necrotic vaginal mucosa Tissue edges are present which is being sloughed  Cervix: Surgically absent  Uterus: Surgically absent  Adnexa: Normal  RV: Normal External exam  Bladder: Nontender MUSCULOSKELETAL: Normal range of motion. No tenderness.  No cyanosis, clubbing, or edema.     Assessment:   1. Pelvic pain in female No evidence of acute findings on exam to suggest infection or hernia.  Vaginal cuff is healing and suture dissolution is noted.  - POCT urinalysis dipstick: many leukocytes and blood - Urine culture     Plan:   1.  Watch for fever greater than 100.5. 2.  Urine culture. 3.  Return in 1 week for recheck

## 2014-11-12 NOTE — Telephone Encounter (Signed)
Pt aware per mad ok to sent in estradiol 1mg  1qd.

## 2014-11-12 NOTE — Patient Instructions (Addendum)
1.  Watch for temperature greater than 100.5, nausea and vomiting with inability to keep food down, incisional drainage, worsening abdominal pelvic pain 2.  Continue with Advil 800 mg 3 times a day. 3.  Tylenol when necessary

## 2014-11-12 NOTE — Telephone Encounter (Signed)
Patient called requesting a script for estrogen sent to the [facility] on [address].Thanks

## 2014-11-13 LAB — URINE CULTURE: Organism ID, Bacteria: NO GROWTH

## 2014-11-13 NOTE — Progress Notes (Signed)
Subjective:    Patient ID: Wendy Johnson, female    DOB: 1966-02-07, 49 y.o.   MRN: [id number]   hief complaint: Chronic low back pain. Chronic bilateral lower extremity pain and diffuse body pain   HPI  Wendy Johnson is a pleasant 49 year old white female. She presents today for evaluation. She has a long-standing history of diffuse body pain including mid thoracic pain right lower extremity pain and left knee pain including groin pain. She has been taking Percocet 10 mg tablets with one half tablet twice a day and this generally keeps her pain under reasonable control. She denies any diverting or illicit use and states that she takes the medications as prescribed. She has diffuse osteoarthritis affecting the hips and knees with a scheduled hip replacement here in the near future otherwise she says she is in her usual state of health this time without changes in bowel bladder function or lower extremity strength and function.  BP 110/62 mmHg  Pulse 76  Temp(Src) 97.8 F (36.6 C) (Oral)  Resp 18  Ht 5\' 4"  (1.626 m)  Wt 188 lb (85.276 kg)  BMI 32.25 kg/m2  SpO2 99%  LMP 09/26/2014    Current outpatient prescriptions:  .  albuterol (PROAIR HFA) 108 (90 BASE) MCG/ACT inhaler, Inhale 2 puffs into the lungs every 6 (six) hours as needed., Disp: , Rfl:  .  amphetamine-dextroamphetamine (ADDERALL) 30 MG tablet, Take 30 mg by mouth 2 (two) times daily., Disp: , Rfl:  .  ARIPiprazole (ABILIFY) 15 MG tablet, Take 15 mg by mouth daily., Disp: , Rfl:  .  baclofen (LIORESAL) 20 MG tablet, Take 20 mg by mouth every 6 (six) hours as needed for muscle spasms. , Disp: , Rfl:  .  busPIRone (BUSPAR) 10 MG tablet, Take 20 mg by mouth 2 (two) times daily. , Disp: , Rfl:  .  clonazePAM (KLONOPIN) 1 MG tablet, Take 1-2 mg by mouth 3 (three) times daily as needed for anxiety., Disp: , Rfl:  .  DEXILANT 60 MG capsule, TAKE ONE (1) CAPSULE EACH DAY FOR GERD, Disp: 30 capsule, Rfl: 5 .  docusate sodium (COLACE) 100 MG  capsule, Take 1 capsule (100 mg total) by mouth 2 (two) times daily., Disp: 10 capsule, Rfl: 0 .  gabapentin (NEURONTIN) 100 MG capsule, Take 200-300 mg by mouth 3 (three) times daily. 3 caps in the morning and 2 caps in the afternoon and night, Disp: , Rfl:  .  hydrochlorothiazide (MICROZIDE) 12.5 MG capsule, Take 1 capsule by mouth daily., Disp: , Rfl:  .  ibuprofen (ADVIL,MOTRIN) 800 MG tablet, Take 800 mg by mouth every 8 (eight) hours as needed for moderate pain. , Disp: , Rfl:  .  Multiple Vitamin (MULTIVITAMIN WITH MINERALS) TABS tablet, Take 1 tablet by mouth daily., Disp: , Rfl:  .  pravastatin (PRAVACHOL) 40 MG tablet, Take 40 mg by mouth daily., Disp: , Rfl:  .  sertraline (ZOLOFT) 50 MG tablet, Take 100 mg by mouth 2 (two) times daily. , Disp: , Rfl:  .  tiotropium (SPIRIVA) 18 MCG inhalation capsule, Place 18 mcg into inhaler and inhale daily., Disp: , Rfl:  .  traMADol (ULTRAM) 50 MG tablet, Take 1 tablet by mouth every 6 (six) hours as needed., Disp: , Rfl:  .  traZODone (DESYREL) 150 MG tablet, Take 300 mg by mouth at bedtime as needed for sleep. , Disp: , Rfl:  .  estradiol (ESTRACE) 1 MG tablet, Take 1 tablet (1 mg total) by  mouth daily., Disp: 30 tablet, Rfl: 11 .  fluconazole (DIFLUCAN) 150 MG tablet, Take 1 tablet (150 mg total) by mouth once., Disp: 1 tablet, Rfl: 0 .  Oxycodone HCl 10 MG TABS, Take 1 tablet (10 mg total) by mouth every 4 (four) hours as needed (one half tab qid is acceptable)., Disp: 60 tablet, Rfl: 0   Patient Active Problem List   Diagnosis Date Noted  . Low back pain at multiple sites 11/11/2014  . Generalized anxiety disorder 11/11/2014  . DDD (degenerative disc disease), lumbar 11/11/2014  . Facet arthritis of lumbar region 11/11/2014  . Primary osteoarthritis involving multiple joints 11/11/2014  . H/O hysterectomy for benign disease 10/20/2014  . History of endometriosis 10/16/2014  . Low back derangement syndrome 09/30/2014  . Degenerative  joint disease of both lower legs 09/30/2014  . Bipolar disorder 09/30/2014     Review of Systems  Review of systems negative for cardiovascular negative for pulmonary she snores if her neurologic positive for anxiety depression panic attacks and insomnia she also has bipolar disease as CD with a history of reflux negative for GU negative for hematologic negative for endocrine negative for rheumatologic  Social history is positive for being divorced with one child she smokes pack cigarettes per day for doing this for 12 years  Currently disabled since 2006.  Family history is positive for alcoholism diabetes and high blood pressure     Objective:   Physical Exam  Patient is 49 year old white female in no acute distress she's alert oranges requires been compliant heart is regular rate and rhythm and lungs are clear also dictation bilaterally wheeze. Traction low back reveals some paraspinous muscle tenderness but no overt trigger points. She also has midthoracic paraspinous muscle tenderness. He has pain on extension and bilateral rotation in standing position. Preserved throughout the lower extremities with good muscle tone and bulk. No fasciculations are noted sensation appears to be grossly intact as have some mild swelling about the right knee following surgery and this is followed by her orthopedic physicians. He has a well-healed scar on the right side.       Assessment & Plan:  #1. Chronic low back pain with low back syndrome  #2. Diffuse body pain  #3. Degenerative joint disease  #4. History of bipolar disorder  #5. History of anxiety depression on chronic adenoid management  Plan: #1. A long discussion with patient regarding her care and I think it would be reasonable based on the chronicity and severity of her pain to have her on February management as the pain has been quite recalcitrant and she has been quite miserable with both her back pain and lower extremity pain. She  will need continued follow-up with her orthopedic physicians as well as her primary care physicians. She has been on Percocet 10 mg tablets one twice a day for this. On her evaluation as to the uses of her medications consider except for patient for chronic adenoid management and allow her to be on Percocet 10 mg 1 by mouth twice a day for which she can't take a half tablet 4 times a day when necessary if necessary. Return to clinic in 1 month for reevaluatioN.

## 2014-11-18 ENCOUNTER — Encounter: Payer: Self-pay | Admitting: Obstetrics and Gynecology

## 2014-11-18 ENCOUNTER — Other Ambulatory Visit: Payer: Self-pay

## 2014-11-18 ENCOUNTER — Ambulatory Visit (INDEPENDENT_AMBULATORY_CARE_PROVIDER_SITE_OTHER): Payer: Medicare Other | Admitting: Obstetrics and Gynecology

## 2014-11-18 VITALS — BP 114/79 | HR 83 | Wt 193.1 lb

## 2014-11-18 DIAGNOSIS — Z842 Family history of other diseases of the genitourinary system: Secondary | ICD-10-CM

## 2014-11-18 DIAGNOSIS — Z09 Encounter for follow-up examination after completed treatment for conditions other than malignant neoplasm: Secondary | ICD-10-CM

## 2014-11-18 DIAGNOSIS — Z90710 Acquired absence of both cervix and uterus: Secondary | ICD-10-CM

## 2014-11-18 DIAGNOSIS — Z8742 Personal history of other diseases of the female genital tract: Secondary | ICD-10-CM

## 2014-11-18 DIAGNOSIS — G8918 Other acute postprocedural pain: Secondary | ICD-10-CM

## 2014-11-18 MED ORDER — PRAVASTATIN SODIUM 40 MG PO TABS: 40.0000 mg | ORAL_TABLET | Freq: Every day | ORAL | Status: DC

## 2014-11-18 NOTE — Patient Instructions (Signed)
1.  No sex for 2 weeks. 2.  Resume all other activities without restriction. 3.  Continue with estradiol 1 mg a day. 4.  Return in 6 months for follow-up.

## 2014-11-18 NOTE — Progress Notes (Signed)
Patient ID: Wendy Johnson, female   DOB: 05/14/1965, 49 y.o.   MRN: [id number] Pt states she was sexually active on Saturday which caused vaginal bleeding and pain. Continues to c/o being sore. Subjective:     Wendy Johnson is a 49 y.o. female who presents to the clinic 4 weeks status post total abdominal hysterectomyBSO for Endometriosis. Eating a regular diet without difficulty. Bowel movements are normal.    Review of Systems Per HPI   Objective:    BP 114/79 mmHg  Pulse 83  Wt 193 lb 1 oz (87.573 kg)  LMP 09/26/2014 General:  alert and cooperative  Abdomen: bowel sounds active, non-tender, no abnormal masses, no hepatosplenomegaly, Midline incision is well-healed; mild induration noted on the right aspect of incision without evidence of abscess or hematoma or hernia  Incision:   incision well approximated     Assessment:    Doing well postoperatively. Operative findings again reviewed. Pathology report discussed.    Plan:    1. Continue any current medications. 2. Wound care discussed. 3. Activity restrictions: no sex for 2 weeks 4. Anticipated return to work: now. 5. Follow up: 6 months

## 2014-11-18 NOTE — Telephone Encounter (Signed)
Pharmacy requesting 90 day supply

## 2014-11-26 ENCOUNTER — Ambulatory Visit (INDEPENDENT_AMBULATORY_CARE_PROVIDER_SITE_OTHER): Payer: Medicare Other | Admitting: Family Medicine

## 2014-11-26 ENCOUNTER — Encounter: Payer: Self-pay | Admitting: Family Medicine

## 2014-11-26 DIAGNOSIS — G894 Chronic pain syndrome: Secondary | ICD-10-CM | POA: Insufficient documentation

## 2014-11-26 DIAGNOSIS — K5909 Other constipation: Secondary | ICD-10-CM | POA: Insufficient documentation

## 2014-11-26 DIAGNOSIS — J449 Chronic obstructive pulmonary disease, unspecified: Secondary | ICD-10-CM | POA: Diagnosis not present

## 2014-11-26 DIAGNOSIS — G43009 Migraine without aura, not intractable, without status migrainosus: Secondary | ICD-10-CM

## 2014-11-26 DIAGNOSIS — Z9981 Dependence on supplemental oxygen: Secondary | ICD-10-CM | POA: Insufficient documentation

## 2014-11-26 DIAGNOSIS — E785 Hyperlipidemia, unspecified: Secondary | ICD-10-CM | POA: Diagnosis not present

## 2014-11-26 DIAGNOSIS — K219 Gastro-esophageal reflux disease without esophagitis: Secondary | ICD-10-CM | POA: Diagnosis not present

## 2014-11-26 DIAGNOSIS — E1169 Type 2 diabetes mellitus with other specified complication: Secondary | ICD-10-CM | POA: Insufficient documentation

## 2014-11-26 DIAGNOSIS — G4734 Idiopathic sleep related nonobstructive alveolar hypoventilation: Secondary | ICD-10-CM | POA: Diagnosis not present

## 2014-11-26 DIAGNOSIS — H9190 Unspecified hearing loss, unspecified ear: Secondary | ICD-10-CM | POA: Insufficient documentation

## 2014-11-26 DIAGNOSIS — G8929 Other chronic pain: Secondary | ICD-10-CM | POA: Diagnosis not present

## 2014-11-26 DIAGNOSIS — M549 Dorsalgia, unspecified: Secondary | ICD-10-CM | POA: Diagnosis not present

## 2014-11-26 DIAGNOSIS — E8881 Metabolic syndrome: Secondary | ICD-10-CM | POA: Insufficient documentation

## 2014-11-26 DIAGNOSIS — G43001 Migraine without aura, not intractable, with status migrainosus: Secondary | ICD-10-CM | POA: Insufficient documentation

## 2014-11-26 DIAGNOSIS — Z8679 Personal history of other diseases of the circulatory system: Secondary | ICD-10-CM | POA: Insufficient documentation

## 2014-11-26 MED ORDER — BACLOFEN 20 MG PO TABS: 20.0000 mg | ORAL_TABLET | Freq: Four times a day (QID) | ORAL | Status: DC | PRN

## 2014-11-26 MED ORDER — BUTALBITAL-APAP-CAFFEINE 50-325-40 MG PO TABS: 1.0000 | ORAL_TABLET | Freq: Four times a day (QID) | ORAL | Status: DC | PRN

## 2014-11-26 MED ORDER — DEXLANSOPRAZOLE 60 MG PO CPDR: 60.0000 mg | DELAYED_RELEASE_CAPSULE | Freq: Every day | ORAL | Status: DC

## 2014-11-26 MED ORDER — PRAVASTATIN SODIUM 40 MG PO TABS: 40.0000 mg | ORAL_TABLET | Freq: Every day | ORAL | Status: DC

## 2014-11-26 NOTE — Progress Notes (Signed)
Name: Wendy Johnson   MRN: [id number]    DOB: 1965-11-16   Date:11/26/2014       Progress Note  Subjective  Chief Complaint  Chief Complaint  Patient presents with  . Medication Refill    3 month F/U  . Edema    Improved  . Hot Flashes    Dr. Enzo Bi also gave patient Estrogen pills and it has improved with the Clonidine  . Hyperlipidemia    no problems  . COPD    HPI  Migraine headaches: she states she has migraine episodes about 10 per month, episodes can last 2 days, she does not take preventive medication or prn medication. She has failed Topamax in the past and does not want to try anything else at this time. She can't tolerate Triptans. Headache is described as frontal, sharp, no phonophobia or photophobia but is associated with nausea.   Edema: doing well, edema resolved  History of endometriosis: had hysterectomy done about 6 weeks ago, and is doing well, taking Estradiol and hot flashes are under control  COPD: she is using Spiriva prn, still using nocturnal oxygen because of desaturations at night. She denies cough or wheezing or SOB during the day Still smoking and is no willing to quit. Uses rescue inhaler about 4 times weekly during the day  GERD; taking Dexilant and symptoms are under control  Chronic back pain: seeing Dr. Andree Elk at the pain clinic Patient Active Problem List   Diagnosis Date Noted  . Metabolic syndrome 44/96/7591  . Migraine without aura and without status migrainosus, not intractable 11/26/2014  . GERD without esophagitis 11/26/2014  . COPD, moderate 11/26/2014  . Nocturnal oxygen desaturation 11/26/2014  . Supplemental oxygen dependent 11/26/2014  . Hearing loss 11/26/2014  . Chronic insomnia 11/26/2014  . History of hypertension 11/26/2014  . Dyslipidemia 11/26/2014  . History of morbid obesity 11/26/2014  . History of diabetes mellitus, type II 11/26/2014  . Chronic constipation 11/26/2014  . Generalized anxiety disorder  11/11/2014  . DDD (degenerative disc disease), lumbar 11/11/2014  . Facet arthritis of lumbar region 11/11/2014  . Primary osteoarthritis involving multiple joints 11/11/2014  . H/O hysterectomy for benign disease 10/20/2014  . History of endometriosis 10/16/2014  . Low back derangement syndrome 09/30/2014  . Degenerative joint disease of both lower legs 09/30/2014  . Bipolar disorder 09/30/2014    Past Surgical History  Procedure Laterality Date  . Lipoma excision    . Knee arthroscopo Right   . Joint replacement Right   . Tonsillectomy    . Tubal ligation    . Laparoscopy  09/22/2014    Procedure: LAPAROSCOPY OPERATIVE;  Surgeon: Brayton Mars, MD;  Location: [facility] ORS;  Service: Gynecology;;  excision and fulgeration of endomertriosis  . Foot surgery Bilateral   . Bone spurs removed Bilateral   . Abdominal hysterectomy N/A 10/20/2014    Procedure: Total abdominial hysterectomy, bilateral salpingo-oophorectomy;  Surgeon: Brayton Mars, MD;  Location: [facility] ORS;  Service: Gynecology;  Laterality: N/A;  . Bilateral salpingoophorectomy      Family History  Problem Relation Age of Onset  . Diabetes Mother   . Heart disease Father   . Heart attack Father   . Diabetes Sister     History   Social History  . Marital Status: Divorced    Spouse Name: N/A  . Number of Children: N/A  . Years of Education: N/A   Occupational History  . Not on file.   Social History  Main Topics  . Smoking status: Current Every Day Smoker -- 0.50 packs/day for 35 years    Types: Cigarettes    Start date: 11/26/1979  . Smokeless tobacco: Never Used  . Alcohol Use: No  . Drug Use: No     Comment: quit crack cocaine 2004  . Sexual Activity:    Partners: Male    Patent examiner Protection: None   Other Topics Concern  . Not on file   Social History Narrative     Current outpatient prescriptions:  .  albuterol (PROAIR HFA) 108 (90 BASE) MCG/ACT inhaler, Inhale 2 puffs into the  lungs every 6 (six) hours as needed., Disp: , Rfl:  .  amphetamine-dextroamphetamine (ADDERALL) 30 MG tablet, Take 30 mg by mouth 2 (two) times daily., Disp: , Rfl:  .  ARIPiprazole (ABILIFY) 15 MG tablet, Take 15 mg by mouth daily., Disp: , Rfl:  .  baclofen (LIORESAL) 20 MG tablet, Take 1 tablet (20 mg total) by mouth every 6 (six) hours as needed for muscle spasms., Disp: 60 each, Rfl: 5 .  busPIRone (BUSPAR) 10 MG tablet, Take 20 mg by mouth 2 (two) times daily. , Disp: , Rfl:  .  clonazePAM (KLONOPIN) 1 MG tablet, Take 1-2 mg by mouth 3 (three) times daily as needed for anxiety., Disp: , Rfl:  .  dexlansoprazole (DEXILANT) 60 MG capsule, Take 1 capsule (60 mg total) by mouth daily., Disp: 30 capsule, Rfl: 5 .  docusate sodium (COLACE) 100 MG capsule, Take 1 capsule (100 mg total) by mouth 2 (two) times daily., Disp: 10 capsule, Rfl: 0 .  estradiol (ESTRACE) 1 MG tablet, Take 1 tablet (1 mg total) by mouth daily., Disp: 30 tablet, Rfl: 11 .  ibuprofen (ADVIL,MOTRIN) 800 MG tablet, Take 800 mg by mouth every 8 (eight) hours as needed for moderate pain. , Disp: , Rfl:  .  Multiple Vitamin (MULTIVITAMIN WITH MINERALS) TABS tablet, Take 1 tablet by mouth daily., Disp: , Rfl:  .  Oxycodone HCl 10 MG TABS, Take 1 tablet (10 mg total) by mouth every 4 (four) hours as needed (one half tab qid is acceptable)., Disp: 60 tablet, Rfl: 0 .  pravastatin (PRAVACHOL) 40 MG tablet, Take 1 tablet (40 mg total) by mouth daily., Disp: 30 tablet, Rfl: 5 .  sertraline (ZOLOFT) 50 MG tablet, Take 100 mg by mouth 2 (two) times daily. , Disp: , Rfl:  .  tiotropium (SPIRIVA) 18 MCG inhalation capsule, Place 18 mcg into inhaler and inhale daily., Disp: , Rfl:  .  traZODone (DESYREL) 150 MG tablet, Take 300 mg by mouth at bedtime as needed for sleep. , Disp: , Rfl:  .  butalbital-acetaminophen-caffeine (FIORICET) 50-325-40 MG per tablet, Take 1-2 tablets by mouth every 6 (six) hours as needed for headache., Disp: 30 tablet,  Rfl: 0  Allergies  Allergen Reactions  . Imitrex [Sumatriptan] Other (See Comments)    Chest pain     ROS  Constitutional: Negative for fever positive for weight change.  Respiratory: Negative for cough and shortness of breath.   Cardiovascular: Negative for chest pain or palpitations.  Gastrointestinal: Negative for abdominal pain, no bowel changes.  Musculoskeletal: Negative for gait problem or joint swelling.  Skin: Negative for rash.  Neurological: Negative for dizziness positive for  headache.  No other specific complaints in a complete review of systems (except as listed in HPI above).  Objective  Filed Vitals:   11/26/14 0834  BP: 116/66  Pulse: 102  Temp:  98.4 F (36.9 C)  TempSrc: Oral  Resp: 18  Height: 5\' 4"  (1.626 m)  Weight: 193 lb 8 oz (87.771 kg)  SpO2: 97%    Body mass index is 33.2 kg/(m^2).  Physical Exam  Constitutional: Patient appears well-developed and well-nourished. Obese No distress.  Eyes:  No scleral icterus. PERL Neck: Normal range of motion. Neck supple. Cardiovascular: Normal rate, regular rhythm and normal heart sounds.  No murmur heard. No BLE edema. Pulmonary/Chest: Effort normal and breath sounds shows rhonchi bilatearlly . No respiratory distress. Abdominal: Soft.  There is no tenderness. Psychiatric: Patient has a normal mood and affect. behavior is normal. Judgment and thought content normal.  Recent Results (from the past 2160 hour(s))  CBC     Status: Abnormal   Collection Time: 09/17/14 11:26 AM  Result Value Ref Range   WBC 7.0 3.6 - 11.0 K/uL   RBC 4.85 3.80 - 5.20 MIL/uL   Hemoglobin 13.8 12.0 - 16.0 g/dL   HCT 41.5 35.0 - 47.0 %   MCV 85.5 80.0 - 100.0 fL   MCH 28.5 26.0 - 34.0 pg   MCHC 33.3 32.0 - 36.0 g/dL   RDW 15.1 (H) 11.5 - 14.5 %   Platelets 195 150 - 440 K/uL  Potassium     Status: None   Collection Time: 09/17/14 11:26 AM  Result Value Ref Range   Potassium 3.9 3.5 - 5.1 mmol/L  Pregnancy, urine POC      Status: None   Collection Time: 09/22/14 10:47 AM  Result Value Ref Range   Preg Test, Ur NEGATIVE NEGATIVE    Comment:        THE SENSITIVITY OF THIS METHODOLOGY IS >24 mIU/mL   Surgical pathology     Status: None   Collection Time: 09/22/14 12:45 PM  Result Value Ref Range   SURGICAL PATHOLOGY      Surgical Pathology CASE: [id number] PATIENT: [facility] Surgical Pathology Report     SPECIMEN SUBMITTED: A. Cul-de-sac, central, biopsy B. Cul-de-sac, right, biopsy C. Ovarian fossa, right, biopsy D. Ovarian fossa, left, biopsy  CLINICAL HISTORY: None provided  PRE-OPERATIVE DIAGNOSIS: Dysmenorrhea, pelvic pain  POST-OPERATIVE DIAGNOSIS: None provided     DIAGNOSIS: A. CUL-DE-SAC, CENTRAL; BIOPSY: - ENDOMETRIOSIS.  B. CUL-DE-SAC, RIGHT; BIOPSY: - MESOTHELIAL LINED FIBROADIPOSE TISSUE, CONSISTENT WITH ADHESION.  C. RIGHT OVARIAN FOSSA; BIOPSY: - MESOTHELIAL LINED FIBROADIPOSE TISSUE WITH MINIMAL CHRONIC INFLAMMATION, CONSISTENT WITH ADHESION.  D. LEFT OVARIAN FOSSA; BIOPSY: - ENDOSALPINGOSIS.    GROSS DESCRIPTION:  A. Labeled: cul-de-sac central biopsy Tissue Fragment(s): 3 Measurement: 0.2-0.4 cm Comment: pink to tan  Entirely submitted in cassette(s): 1  B. Labeled: cul-de-sac biopsy right Tissue Fragment(s): 3 Measurement: 0 .1-0.3 cm Comment: pink  Entirely submitted in cassette(s): 1  C. Labeled: right ovarian fossa biopsy Tissue Fragment(s): 2 Measurement: 0.3 and 1.4 cm Comment: pink  Entirely submitted in cassette(s): 1  D. Labeled: left ovarian fossa Tissue Fragment(s): 2 Measurement: 0.2 and 0.5 cm Comment: pink  Entirely submitted in cassette(s): 1         Final Diagnosis performed by Delorse Lek, MD.  Electronically signed 09/23/2014 12:21:44PM    The electronic signature indicates that the named Attending Pathologist has evaluated the specimen  Technical component performed at [facility], [address],  [place], [place] [address] Lab: [phone] Dir: Darrick Penna. Evette Doffing, MD  Professional component performed at [facility], [facility], [place], [place], [place] [address] Lab: [phone] Dir: Dellia Nims. Reuel Derby, MD  CBC WITH DIFFERENTIAL     Status: None   Collection Time: 10/16/14  4:31 PM  Result Value Ref Range   WBC 6.4 3.6 - 11.0 K/uL   RBC 4.77 3.80 - 5.20 MIL/uL   Hemoglobin 13.6 12.0 - 16.0 g/dL   HCT 41.7 35.0 - 47.0 %   MCV 87.4 80.0 - 100.0 fL   MCH 28.5 26.0 - 34.0 pg   MCHC 32.6 32.0 - 36.0 g/dL   RDW 14.4 11.5 - 14.5 %   Platelets 198 150 - 440 K/uL   Neutrophils Relative % 56 %   Neutro Abs 3.7 1.4 - 6.5 K/uL   Lymphocytes Relative 31 %   Lymphs Abs 2.0 1.0 - 3.6 K/uL   Monocytes Relative 7 %   Monocytes Absolute 0.4 0.2 - 0.9 K/uL   Eosinophils Relative 5 %   Eosinophils Absolute 0.3 0 - 0.7 K/uL   Basophils Relative 1 %   Basophils Absolute 0.1 0 - 0.1 K/uL  Rapid HIV screen (HIV 1/2 Ab+Ag)     Status: None   Collection Time: 10/16/14  4:31 PM  Result Value Ref Range   HIV-1 P24 Antigen - HIV24 NON REACTIVE NON REACTIVE   HIV 1/2 Antibodies NON REACTIVE NON REACTIVE   Interpretation (HIV Ag Ab)      A non reactive test result means that HIV 1 or HIV 2 antibodies and HIV 1 p24 antigen were not detected in the specimen.  RPR     Status: None   Collection Time: 10/16/14  4:31 PM  Result Value Ref Range   RPR Ser Ql Non Reactive Non Reactive    Comment: (NOTE) Performed At: [facility] [address] [place], [place] [id number] Lindon Romp MD AU:[id number]   Type and screen     Status: None   Collection Time: 10/16/14  4:31 PM  Result Value Ref Range   ABO/RH(D) A POS    Antibody Screen NEG    Sample Expiration 10/30/2014   ABO/Rh     Status: None   Collection Time: 10/16/14  4:31 PM  Result Value Ref Range   ABO/RH(D) A POS   Pregnancy, urine POC     Status: None   Collection Time: 10/20/14  6:09 AM  Result  Value Ref Range   Preg Test, Ur NEGATIVE NEGATIVE    Comment:        THE SENSITIVITY OF THIS METHODOLOGY IS >24 mIU/mL   Surgical pathology     Status: None   Collection Time: 10/20/14  8:30 AM  Result Value Ref Range   SURGICAL PATHOLOGY      Surgical Pathology CASE: [id number] PATIENT: [facility] Surgical Pathology Report     SPECIMEN SUBMITTED: A. Uterus, cervix, bilateral tubes, bilateral ovaries  CLINICAL HISTORY: None provided  PRE-OPERATIVE DIAGNOSIS: Endometriosis  POST-OPERATIVE DIAGNOSIS: endometriosis     DIAGNOSIS: A. UTERUS WITH CERVIX AND BILATERAL ADNEXA; HYSTERECTOMY WITH BILATERAL SALPINGO-OOPHORECTOMY: - SQUAMOUS METAPLASIA AND NABOTHIAN CYSTS OF THE CERVIX. - PROLIFERATIVE ENDOMETRIUM AND FOCAL MILD SEROSAL ADHESIONS. - RIGHT OVARY WITH MILD STROMAL HYPERPLASIA. - RIGHT FALLOPIAN TUBE WITHOUT PATHOLOGIC CHANGE. - HEMORRHAGIC FOLLICULAR CYST OF LEFT OVARY. - LEFT FALLOPIAN TUBE WITH VASCULAR CONGESTION.   GROSS DESCRIPTION:  A. The specimen is received in a formalin-filled container labeled with the patient's name and uterus, cervix, bilateral tubes, bilateral ovaries.  Adnexa: bilateral tubes and ovaries Weight: 108 grams Dimensions:      Fundus -4.7 x 4.5 x  4.5 cm  Cervix -2.4 x 2.6 cm Serosa: pink-tan with focal fibrous adhesion Cervix: smooth pink Endocervix: trabecular tan Endometrial cavity:      Dimensions -1.6 x 2.0 cm      Thickness -0.2 cm      Other findings -none noted Myometrium:     Thickness -2.5 cm     Other findings -none noted Adnexa:      Right ovary           Measurement -2.1 x 1.4 x 1.3 cm           Serosa -lobulated tan, intact           Cut surface -heterogeneous pink tan      Right fallopian tube           Measurements -discontinuous 5.0 cm in length x 1.0 cm in diameter           Other findings -none noted      Left ovary           Measurement -2.7 x 2.3 x 1.5 cm           Serosa  -lobulated disrupted, purple tan focally disrupted with a 0.4 x 0.3 x 0.2 cm clear fluid-filled cyst           Cut surface -heterogeneous pink-tan with a 1.1 x 1.0 x 0.8 cm blood filled cyst      Left fallopian tube            Measurements -discontinuous 5.6 cm in length x 1.4 cm in diameter           Othe r findings -multiple 0.1 cm paratubal cysts Other comments: none noted  Block summary:  1-representative anterior cervix 2-representative posterior cervix 3-representative anterior endomyometrium 4-representative posterior endomyometrium 5-representative right ovary 6-representative right fallopian tube 7-representative left ovary 8-representative left fallopian tube Final Diagnosis performed by Delorse Lek, MD.  Electronically signed 10/21/2014 2:38:57PM    The electronic signature indicates that the named Attending Pathologist has evaluated the specimen  Technical component performed at [facility], [address], [place], [place] [address] Lab: [phone] Dir: Darrick Penna. Evette Doffing, MD  Professional component performed at [facility], [facility], [place], [place], [place] [address] Lab: [phone] Dir: Dellia Nims. Rubinas, MD    Hemoglobin     Status: None   Collection Time: 10/21/14  6:08 AM  Result Value Ref Range   Hemoglobin 12.6 12.0 - 16.0 g/dL  POCT urinalysis dipstick     Status: Abnormal   Collection Time: 11/12/14  9:10 AM  Result Value Ref Range   Color, UA yellow    Clarity, UA clear    Glucose, UA neg    Bilirubin, UA neg    Ketones, UA neg    Spec Grav, UA <=1.005    Blood, UA pos    pH, UA 6.0    Protein, UA neg    Urobilinogen, UA 0.2    Nitrite, UA neg    Leukocytes, UA large (3+) (A) Negative  Urine culture     Status: None   Collection Time: 11/12/14 11:55 AM  Result Value Ref Range   Urine Culture, Routine Final report    Result 1 No growth     PHQ2/9: Depression screen [facility] 2/9 11/26/2014 09/29/2014  Decreased  Interest 0 0  Down, Depressed, Hopeless 0 0  PHQ - 2 Score 0 0    Fall Risk: Fall Risk  11/26/2014 09/29/2014  Falls in the past year?  No Yes  Number falls in past yr: - 1  Injury with Fall? - Yes  Risk for fall due to : - Medication side effect  Follow up - (No Data)    Assessment & Plan  1. Migraine without aura and without status migrainosus, not intractable We will try Fioricet - butalbital-acetaminophen-caffeine (FIORICET) 50-325-40 MG per tablet; Take 1-2 tablets by mouth every 6 (six) hours as needed for headache.  Dispense: 30 tablet; Refill: 0  2. GERD without esophagitis Under control  - dexlansoprazole (DEXILANT) 60 MG capsule; Take 1 capsule (60 mg total) by mouth daily.  Dispense: 30 capsule; Refill: 5  3. COPD, moderate Continue nocturnal oxygen, advised to also use Spiriva daily   4. Nocturnal oxygen desaturation Continue supplementation   5. Supplemental oxygen dependent 2 liter every night  6. Dyslipidemia  - pravastatin (PRAVACHOL) 40 MG tablet; Take 1 tablet (40 mg total) by mouth daily.  Dispense: 30 tablet; Refill: 5  7. Chronic back pain Continue follow up with Dr. Andree Elk - baclofen (LIORESAL) 20 MG tablet; Take 1 tablet (20 mg total) by mouth every 6 (six) hours as needed for muscle spasms.  Dispense: 60 each; Refill: 5

## 2014-11-27 ENCOUNTER — Telehealth: Payer: Self-pay | Admitting: Obstetrics and Gynecology

## 2014-11-27 NOTE — Telephone Encounter (Signed)
PT CALLED AND WANTED TO SEE IF DR DE COULD SEND IN FLAG[place] TO [facility], [facility] [address]

## 2014-11-27 NOTE — Telephone Encounter (Signed)
Pt states she is still having a dark red d/c with odor. Pos itch. Having a pain on the outside of vagina after urination. It doesn't hurt to urinate. Has an appt on 12/02/2014. Aware I will send MAD a message.

## 2014-11-27 NOTE — Telephone Encounter (Signed)
Pt aware to use monistat OTC.

## 2014-12-02 ENCOUNTER — Encounter
Admission: RE | Admit: 2014-12-02 | Discharge: 2014-12-02 | Disposition: A | Discharge status: Home / Self Care | Payer: Medicare Other | Source: Ambulatory Visit | Attending: Orthopedic Surgery | Admitting: Orthopedic Surgery

## 2014-12-02 ENCOUNTER — Encounter: Payer: Medicare Other | Admitting: Obstetrics and Gynecology

## 2014-12-02 DIAGNOSIS — Z01812 Encounter for preprocedural laboratory examination: Secondary | ICD-10-CM | POA: Insufficient documentation

## 2014-12-02 LAB — BASIC METABOLIC PANEL
ANION GAP: 11 (ref 5–15)
BUN: 8 mg/dL (ref 6–20)
CALCIUM: 9.4 mg/dL (ref 8.9–10.3)
CO2: 27 mmol/L (ref 22–32)
Chloride: 100 mmol/L — ABNORMAL LOW (ref 101–111)
Creatinine, Ser: 0.51 mg/dL (ref 0.44–1.00)
GFR calc Af Amer: >60 mL/min (ref >60)
GFR calc non Af Amer: >60 mL/min (ref >60)
Glucose, Bld: 90 mg/dL (ref 65–99)
Potassium: 3.9 mmol/L (ref 3.5–5.1)
SODIUM: 138 mmol/L (ref 135–145)

## 2014-12-02 LAB — URINALYSIS COMPLETE WITH MICROSCOPIC (ARMC ONLY)
Bacteria, UA: NONE SEEN
Bilirubin Urine: NEGATIVE
Glucose, UA: NEGATIVE mg/dL
Ketones, ur: NEGATIVE mg/dL
NITRITE: NEGATIVE
PH: 6 (ref 5.0–8.0)
PROTEIN: NEGATIVE mg/dL
Specific Gravity, Urine: 1.011 (ref 1.005–1.030)

## 2014-12-02 LAB — CBC
HCT: 44 % (ref 35.0–47.0)
Hemoglobin: 14.6 g/dL (ref 12.0–16.0)
MCH: 28.1 pg (ref 26.0–34.0)
MCHC: 33.3 g/dL (ref 32.0–36.0)
MCV: 84.3 fL (ref 80.0–100.0)
Platelets: 234 10*3/uL (ref 150–440)
RBC: 5.22 MIL/uL — ABNORMAL HIGH (ref 3.80–5.20)
RDW: 15.3 % — ABNORMAL HIGH (ref 11.5–14.5)
WBC: 12.2 10*3/uL — ABNORMAL HIGH (ref 3.6–11.0)

## 2014-12-02 LAB — PROTIME-INR
INR: 1.02
PROTHROMBIN TIME: 13.6 s (ref 11.4–15.0)

## 2014-12-02 LAB — TYPE AND SCREEN
ABO/RH(D): A POS
Antibody Screen: NEGATIVE

## 2014-12-02 LAB — SURGICAL PCR SCREEN
MRSA, PCR: NEGATIVE
STAPHYLOCOCCUS AUREUS: NEGATIVE

## 2014-12-02 LAB — SEDIMENTATION RATE: SED RATE: 9 mm/h (ref 0–20)

## 2014-12-02 LAB — APTT: aPTT: 29 seconds (ref 24–36)

## 2014-12-02 NOTE — Patient Instructions (Signed)
  Your procedure is scheduled on: Tuesday 12/16/2014 Report to Day Surgery. 2ND FLOOR MEDICAL MALL ENTRANCE To find out your arrival time please call [phone] between 1PM - 3PM on Monday 12/15/2014.  Remember: Instructions that are not followed completely may result in serious medical risk, up to and including death, or upon the discretion of your surgeon and anesthesiologist your surgery may need to be rescheduled.    __X__ 1. Do not eat food or drink liquids after midnight. No gum chewing or hard candies.     __X__ 2. No Alcohol for 24 hours before or after surgery.   ____ 3. Bring all medications with you on the day of surgery if instructed.    __X__ 4. Notify your doctor if there is any change in your medical condition     (cold, fever, infections).     Do not wear jewelry, make-up, hairpins, clips or nail polish.  Do not wear lotions, powders, or perfumes.   Do not shave 48 hours prior to surgery. Men may shave face and neck.  Do not bring valuables to the hospital.    [facility] is not responsible for any belongings or valuables.               Contacts, dentures or bridgework may not be worn into surgery.  Leave your suitcase in the car. After surgery it may be brought to your room.  For patients admitted to the hospital, discharge time is determined by your                treatment team.   Patients discharged the day of surgery will not be allowed to drive home.   Please read over the following fact sheets that you were given:   MRSA Information and Surgical Site Infection Prevention   __X__ Take these medicines the morning of surgery with A SIP OF WATER:    1. BUSPAR  2. DEXILANT  3. PRAVASTATIN  4. SERTRALINE  5.  6.  ____ Fleet Enema (as directed)   __X__ Use CHG Soap as directed  __X__ Use inhalers on the day of surgery  [place]  ____ Stop metformin 2 days prior to surgery    ____ Take 1/2 of usual insulin dose the night before surgery and  none on the morning of surgery.   ____ Stop Coumadin/Plavix/aspirin on   __X__ Stop Anti-inflammatories on      STOP IBUPROFEN 7 DAYS PRIOR TO SURGERY   ____ Stop supplements until after surgery.    ____ Bring C-Pap to the hospital.

## 2014-12-03 ENCOUNTER — Encounter: Payer: Self-pay | Admitting: Anesthesiology

## 2014-12-03 ENCOUNTER — Ambulatory Visit: Payer: Medicare Other | Attending: Anesthesiology | Admitting: Anesthesiology

## 2014-12-03 VITALS — BP 112/80 | HR 87 | Temp 98.3°F | Resp 14 | Ht 64.0 in | Wt 190.0 lb

## 2014-12-03 DIAGNOSIS — M5136 Other intervertebral disc degeneration, lumbar region: Secondary | ICD-10-CM

## 2014-12-03 DIAGNOSIS — M150 Primary generalized (osteo)arthritis: Secondary | ICD-10-CM

## 2014-12-03 DIAGNOSIS — M545 Low back pain: Secondary | ICD-10-CM | POA: Diagnosis not present

## 2014-12-03 DIAGNOSIS — M47812 Spondylosis without myelopathy or radiculopathy, cervical region: Secondary | ICD-10-CM

## 2014-12-03 DIAGNOSIS — M8949 Other hypertrophic osteoarthropathy, multiple sites: Secondary | ICD-10-CM

## 2014-12-03 DIAGNOSIS — G8918 Other acute postprocedural pain: Secondary | ICD-10-CM

## 2014-12-03 DIAGNOSIS — Z842 Family history of other diseases of the genitourinary system: Secondary | ICD-10-CM

## 2014-12-03 DIAGNOSIS — F1190 Opioid use, unspecified, uncomplicated: Secondary | ICD-10-CM

## 2014-12-03 DIAGNOSIS — M159 Polyosteoarthritis, unspecified: Secondary | ICD-10-CM

## 2014-12-03 MED ORDER — OXYCODONE HCL 10 MG PO TABS: 10.0000 mg | ORAL_TABLET | Freq: Three times a day (TID) | ORAL | Status: DC

## 2014-12-03 MED ORDER — OXYCODONE HCL 10 MG PO TABS: 10.0000 mg | ORAL_TABLET | ORAL | Status: DC | PRN

## 2014-12-03 NOTE — Progress Notes (Signed)
Safety precautions to be maintained throughout the outpatient stay will include: orient to surroundings, keep bed in low position, maintain call bell within reach at all times, provide assistance with transfer out of bed and ambulation.  Discharged ambulatory at 4:00 pm

## 2014-12-03 NOTE — Patient Instructions (Signed)
A prescription for Oxycodone x 2 was given to you today. Return in 2 months.

## 2014-12-08 NOTE — Progress Notes (Signed)
Subjective:    Patient ID: Wendy Johnson, female    DOB: December 25, 1965, 49 y.o.   MRN: [id number]    chief complaint: Chronic low back pain. Chronic bilateral lower extremity pain and diffuse body pain   HPI:    Wendy Johnson is a pleasant 49 year old white female. She presents today for evaluation. She has a long-standing history of diffuse body pain including mid thoracic pain, right lower extremity pain, severe left hip pain and left knee pain including groin pain. She has been taking Percocet 10 mg tablets with one half tablet twice a day and this generally keeps her pain under reasonable control. She denies any diverting or illicit use and states that she takes the medications as prescribed however has recently increased her dosing secondary to intensifying left hip pain. She is scheduled to have a left hip procedure done by Dr. Youlanda Mighty in the near future. She is also status post a recent abdominal hysterectomy within the past month that has caused postoperative pain for which she has been treating with the Percocet. She maintains that she has been using these tablets up to 3 times a day to help manage her postoperative pain and intensifying left hip pain. She has diffuse osteoarthritis affecting the hips and knees with a scheduled hip replacement here in the near future otherwise she says she is in her usual state of health this time without changes in bowel bladder function or lower extremity strength and function.  BP 112/80 mmHg  Pulse 87  Temp(Src) 98.3 F (36.8 C) (Oral)  Resp 14  Ht 5\' 4"  (1.626 m)  Wt 190 lb (86.183 kg)  BMI 32.60 kg/m2  SpO2 97%  LMP 09/26/2014    Current outpatient prescriptions:  .  albuterol (PROAIR HFA) 108 (90 BASE) MCG/ACT inhaler, Inhale 2 puffs into the lungs every 6 (six) hours as needed., Disp: , Rfl:  .  amphetamine-dextroamphetamine (ADDERALL) 30 MG tablet, Take 30 mg by mouth 2 (two) times daily., Disp: , Rfl:  .  ARIPiprazole (ABILIFY) 15 MG tablet, Take  15 mg by mouth daily., Disp: , Rfl:  .  baclofen (LIORESAL) 20 MG tablet, Take 1 tablet (20 mg total) by mouth every 6 (six) hours as needed for muscle spasms., Disp: 60 each, Rfl: 5 .  busPIRone (BUSPAR) 10 MG tablet, Take 20 mg by mouth 2 (two) times daily. , Disp: , Rfl:  .  clonazePAM (KLONOPIN) 1 MG tablet, Take 1-2 mg by mouth 3 (three) times daily as needed for anxiety., Disp: , Rfl:  .  dexlansoprazole (DEXILANT) 60 MG capsule, Take 1 capsule (60 mg total) by mouth daily., Disp: 30 capsule, Rfl: 5 .  estradiol (ESTRACE) 1 MG tablet, Take 1 tablet (1 mg total) by mouth daily., Disp: 30 tablet, Rfl: 11 .  ibuprofen (ADVIL,MOTRIN) 800 MG tablet, Take 800 mg by mouth every 8 (eight) hours as needed for moderate pain. , Disp: , Rfl:  .  Multiple Vitamin (MULTIVITAMIN WITH MINERALS) TABS tablet, Take 1 tablet by mouth daily., Disp: , Rfl:  .  Oxycodone HCl 10 MG TABS, Take 1 tablet (10 mg total) by mouth 3 (three) times daily before meals., Disp: 90 tablet, Rfl: 0 .  pravastatin (PRAVACHOL) 40 MG tablet, Take 1 tablet (40 mg total) by mouth daily., Disp: 30 tablet, Rfl: 5 .  sertraline (ZOLOFT) 50 MG tablet, Take 100 mg by mouth 2 (two) times daily. , Disp: , Rfl:  .  tiotropium (SPIRIVA) 18 MCG inhalation capsule, Place  18 mcg into inhaler and inhale daily., Disp: , Rfl:  .  traZODone (DESYREL) 150 MG tablet, Take 300 mg by mouth at bedtime as needed for sleep. , Disp: , Rfl:    Patient Active Problem List   Diagnosis Date Noted  . Metabolic syndrome 14/78/2956  . Migraine without aura and without status migrainosus, not intractable 11/26/2014  . GERD without esophagitis 11/26/2014  . COPD, moderate 11/26/2014  . Nocturnal oxygen desaturation 11/26/2014  . Supplemental oxygen dependent 11/26/2014  . Hearing loss 11/26/2014  . Chronic insomnia 11/26/2014  . History of hypertension 11/26/2014  . Dyslipidemia 11/26/2014  . History of morbid obesity 11/26/2014  . History of diabetes  mellitus, type II 11/26/2014  . Chronic constipation 11/26/2014  . Generalized anxiety disorder 11/11/2014  . DDD (degenerative disc disease), lumbar 11/11/2014  . Facet arthritis of lumbar region 11/11/2014  . Primary osteoarthritis involving multiple joints 11/11/2014  . H/O hysterectomy for benign disease 10/20/2014  . History of endometriosis 10/16/2014  . Low back derangement syndrome 09/30/2014  . Degenerative joint disease of both lower legs 09/30/2014  . Bipolar disorder 09/30/2014     Review of Systems  Review of systems negative for cardiovascular negative for pulmonary she snores if her neurologic positive for anxiety depression panic attacks and insomnia she also has bipolar disease as CD with a history of reflux negative for GU negative for hematologic negative for endocrine negative for rheumatologic  Social history is positive for being divorced with one child she smokes pack cigarettes per day for doing this for 12 years  Currently disabled since 2006.  Family history is positive for alcoholism diabetes and high blood pressure     Objective:   Physical Exam  Patient is 49 year old white female in no acute distress she's alert oranges requires been compliant heart is regular rate and rhythm and lungs are clear also dictation bilaterally wheeze. Traction low back reveals some paraspinous muscle tenderness but no overt trigger points. She also has midthoracic paraspinous muscle tenderness. He has pain on extension and bilateral rotation in standing position. Preserved throughout the lower extremities with good muscle tone and bulk. She does have a well-healed midline abdominal scar from a previous hysterectomy. No fasciculations are noted sensation appears to be grossly intact as have some mild swelling about the right knee following surgery and this is followed by her orthopedic physicians. He has a well-healed scar on the right side.       Assessment & Plan:  #1.  Chronic low back pain with low back syndrome  #2. Diffuse body pain  #3. Degenerative joint disease with severe left hip pain  #4. History of bipolar disorder  #5. History of anxiety depression on chronic opiate management  Plan: #1. A long discussion with patient regarding her care and I think it would be reasonable based on the chronicity and severity of her pain to have her on chronic opioid  management as the pain has been quite recalcitrant and she has been quite miserable with both her back pain and lower extremity pain. She will need continued follow-up with her orthopedic physicians as well as her primary care physicians. She has been on Percocet 10 mg tablets, one twice a day for this. The current regimen is insufficient to treat her pain. I do not see evidence of diverting or illicit use. I will allow her to increase her dose to 1 tablet 3 times a day as a maximum dose as reviewed with her today. We  will have her return to clinic in 2 months for reevaluation and follow her on a two-month regular basis for the near term.  Dr. Vashti Johnson

## 2014-12-11 ENCOUNTER — Ambulatory Visit: Payer: Self-pay | Admitting: Family Medicine

## 2014-12-16 ENCOUNTER — Encounter: Payer: Self-pay | Admitting: Anesthesiology

## 2014-12-16 ENCOUNTER — Inpatient Hospital Stay: Payer: Medicare Other

## 2014-12-16 ENCOUNTER — Inpatient Hospital Stay
Admission: RE | Admit: 2014-12-16 | Discharge: 2014-12-18 | DRG: 470 | Disposition: A | Discharge status: Home / Self Care | Payer: Medicare Other | Source: Ambulatory Visit | Attending: Orthopedic Surgery | Admitting: Orthopedic Surgery

## 2014-12-16 ENCOUNTER — Encounter
Admission: RE | Disposition: A | Discharge status: Home / Self Care | Payer: Self-pay | Source: Ambulatory Visit | Attending: Orthopedic Surgery

## 2014-12-16 ENCOUNTER — Inpatient Hospital Stay: Payer: Medicare Other | Admitting: Anesthesiology

## 2014-12-16 DIAGNOSIS — Z800 Family history of malignant neoplasm of digestive organs: Secondary | ICD-10-CM

## 2014-12-16 DIAGNOSIS — Z9851 Tubal ligation status: Secondary | ICD-10-CM | POA: Diagnosis not present

## 2014-12-16 DIAGNOSIS — Z8249 Family history of ischemic heart disease and other diseases of the circulatory system: Secondary | ICD-10-CM

## 2014-12-16 DIAGNOSIS — M1612 Unilateral primary osteoarthritis, left hip: Secondary | ICD-10-CM | POA: Diagnosis not present

## 2014-12-16 DIAGNOSIS — Z6835 Body mass index (BMI) 35.0-35.9, adult: Secondary | ICD-10-CM | POA: Diagnosis not present

## 2014-12-16 DIAGNOSIS — Z96642 Presence of left artificial hip joint: Secondary | ICD-10-CM | POA: Diagnosis not present

## 2014-12-16 DIAGNOSIS — I10 Essential (primary) hypertension: Secondary | ICD-10-CM | POA: Diagnosis present

## 2014-12-16 DIAGNOSIS — Z833 Family history of diabetes mellitus: Secondary | ICD-10-CM

## 2014-12-16 DIAGNOSIS — E119 Type 2 diabetes mellitus without complications: Secondary | ICD-10-CM | POA: Diagnosis present

## 2014-12-16 DIAGNOSIS — Z419 Encounter for procedure for purposes other than remedying health state, unspecified: Secondary | ICD-10-CM

## 2014-12-16 DIAGNOSIS — Z888 Allergy status to other drugs, medicaments and biological substances status: Secondary | ICD-10-CM

## 2014-12-16 DIAGNOSIS — Z96651 Presence of right artificial knee joint: Secondary | ICD-10-CM | POA: Diagnosis present

## 2014-12-16 DIAGNOSIS — Z7951 Long term (current) use of inhaled steroids: Secondary | ICD-10-CM

## 2014-12-16 DIAGNOSIS — K219 Gastro-esophageal reflux disease without esophagitis: Secondary | ICD-10-CM | POA: Diagnosis present

## 2014-12-16 DIAGNOSIS — Z471 Aftercare following joint replacement surgery: Secondary | ICD-10-CM | POA: Diagnosis not present

## 2014-12-16 DIAGNOSIS — Z79899 Other long term (current) drug therapy: Secondary | ICD-10-CM | POA: Diagnosis not present

## 2014-12-16 DIAGNOSIS — Z9889 Other specified postprocedural states: Secondary | ICD-10-CM

## 2014-12-16 DIAGNOSIS — J449 Chronic obstructive pulmonary disease, unspecified: Secondary | ICD-10-CM | POA: Diagnosis present

## 2014-12-16 DIAGNOSIS — G8918 Other acute postprocedural pain: Secondary | ICD-10-CM

## 2014-12-16 DIAGNOSIS — H9190 Unspecified hearing loss, unspecified ear: Secondary | ICD-10-CM | POA: Diagnosis present

## 2014-12-16 HISTORY — PX: TOTAL HIP ARTHROPLASTY: SHX124

## 2014-12-16 LAB — CBC
HEMATOCRIT: 40.9 % (ref 35.0–47.0)
HEMOGLOBIN: 13.3 g/dL (ref 12.0–16.0)
MCH: 28 pg (ref 26.0–34.0)
MCHC: 32.6 g/dL (ref 32.0–36.0)
MCV: 86.1 fL (ref 80.0–100.0)
Platelets: 189 10*3/uL (ref 150–440)
RBC: 4.75 MIL/uL (ref 3.80–5.20)
RDW: 15.4 % — AB (ref 11.5–14.5)
WBC: 16.6 10*3/uL — ABNORMAL HIGH (ref 3.6–11.0)

## 2014-12-16 LAB — CREATININE, SERUM
Creatinine, Ser: 0.65 mg/dL (ref 0.44–1.00)
GFR calc non Af Amer: >60 mL/min (ref >60)

## 2014-12-16 SURGERY — ARTHROPLASTY, HIP, TOTAL, ANTERIOR APPROACH
Anesthesia: General | Site: Hip | Laterality: Left | Wound class: Clean

## 2014-12-16 MED ORDER — PRAVASTATIN SODIUM 20 MG PO TABS
40.0000 mg | ORAL_TABLET | Freq: Every day | ORAL | Status: DC
  Administered 2014-12-17 – 2014-12-18 (×2): 40 mg via ORAL
  Filled 2014-12-16 (×2): qty 2

## 2014-12-16 MED ORDER — AMPHETAMINE-DEXTROAMPHETAMINE 5 MG PO TABS: 30.0000 mg | ORAL_TABLET | Freq: Two times a day (BID) | ORAL | Status: DC

## 2014-12-16 MED ORDER — PROPOFOL 10 MG/ML IV BOLUS
INTRAVENOUS | Status: DC | PRN
  Administered 2014-12-16: 200 mg via INTRAVENOUS

## 2014-12-16 MED ORDER — TIOTROPIUM BROMIDE MONOHYDRATE 18 MCG IN CAPS
18.0000 ug | ORAL_CAPSULE | Freq: Every day | RESPIRATORY_TRACT | Status: DC
  Administered 2014-12-17 – 2014-12-18 (×2): 18 ug via RESPIRATORY_TRACT
  Filled 2014-12-16: qty 5

## 2014-12-16 MED ORDER — FENTANYL CITRATE (PF) 100 MCG/2ML IJ SOLN
INTRAMUSCULAR | Status: AC
  Administered 2014-12-16: 25 ug via INTRAVENOUS
  Filled 2014-12-16: qty 2

## 2014-12-16 MED ORDER — ARIPIPRAZOLE 5 MG PO TABS
15.0000 mg | ORAL_TABLET | Freq: Every day | ORAL | Status: DC
  Administered 2014-12-16: 15 mg via ORAL
  Filled 2014-12-16: qty 3

## 2014-12-16 MED ORDER — CEFAZOLIN SODIUM-DEXTROSE 2-3 GM-% IV SOLR
INTRAVENOUS | Status: AC
  Filled 2014-12-16: qty 50

## 2014-12-16 MED ORDER — OXYCODONE HCL 5 MG PO TABS
5.0000 mg | ORAL_TABLET | ORAL | Status: DC | PRN
  Administered 2014-12-16 – 2014-12-17 (×4): 10 mg via ORAL
  Administered 2014-12-18: 5 mg via ORAL
  Filled 2014-12-16: qty 1
  Filled 2014-12-16 (×3): qty 2

## 2014-12-16 MED ORDER — MIDAZOLAM HCL 2 MG/2ML IJ SOLN
INTRAMUSCULAR | Status: DC | PRN
  Administered 2014-12-16: 2 mg via INTRAVENOUS

## 2014-12-16 MED ORDER — ONDANSETRON HCL 4 MG/2ML IJ SOLN: 4.0000 mg | Freq: Once | INTRAMUSCULAR | Status: DC | PRN

## 2014-12-16 MED ORDER — MENTHOL 3 MG MT LOZG: 1.0000 | LOZENGE | OROMUCOSAL | Status: DC | PRN

## 2014-12-16 MED ORDER — ADULT MULTIVITAMIN W/MINERALS CH
1.0000 | ORAL_TABLET | Freq: Every day | ORAL | Status: DC
  Administered 2014-12-17 – 2014-12-18 (×2): 1 via ORAL
  Filled 2014-12-16 (×2): qty 1

## 2014-12-16 MED ORDER — BUSPIRONE HCL 10 MG PO TABS
20.0000 mg | ORAL_TABLET | Freq: Two times a day (BID) | ORAL | Status: DC
  Administered 2014-12-16 – 2014-12-18 (×4): 20 mg via ORAL
  Filled 2014-12-16 (×4): qty 2

## 2014-12-16 MED ORDER — PANTOPRAZOLE SODIUM 40 MG PO TBEC
40.0000 mg | DELAYED_RELEASE_TABLET | Freq: Every day | ORAL | Status: DC
  Administered 2014-12-17 – 2014-12-18 (×2): 40 mg via ORAL
  Filled 2014-12-16 (×2): qty 1

## 2014-12-16 MED ORDER — OXYCODONE HCL 5 MG PO TABS
10.0000 mg | ORAL_TABLET | Freq: Three times a day (TID) | ORAL | Status: DC
  Administered 2014-12-16 – 2014-12-18 (×4): 10 mg via ORAL
  Filled 2014-12-16 (×6): qty 2

## 2014-12-16 MED ORDER — ENOXAPARIN SODIUM 40 MG/0.4ML ~~LOC~~ SOLN
40.0000 mg | SUBCUTANEOUS | Status: DC
  Administered 2014-12-17 – 2014-12-18 (×2): 40 mg via SUBCUTANEOUS
  Filled 2014-12-16 (×2): qty 0.4

## 2014-12-16 MED ORDER — ONDANSETRON HCL 4 MG/2ML IJ SOLN
INTRAMUSCULAR | Status: DC | PRN
  Administered 2014-12-16: 4 mg via INTRAVENOUS

## 2014-12-16 MED ORDER — HYDROMORPHONE HCL 1 MG/ML IJ SOLN
0.2500 mg | INTRAMUSCULAR | Status: DC | PRN
  Administered 2014-12-16 (×4): 0.5 mg via INTRAVENOUS

## 2014-12-16 MED ORDER — ACETAMINOPHEN 10 MG/ML IV SOLN
INTRAVENOUS | Status: DC | PRN
  Administered 2014-12-16: 1000 mg via INTRAVENOUS

## 2014-12-16 MED ORDER — SERTRALINE HCL 100 MG PO TABS
100.0000 mg | ORAL_TABLET | Freq: Two times a day (BID) | ORAL | Status: DC
  Administered 2014-12-16 – 2014-12-18 (×3): 100 mg via ORAL
  Filled 2014-12-16 (×3): qty 1

## 2014-12-16 MED ORDER — NEOMYCIN-POLYMYXIN B GU 40-200000 IR SOLN
Status: DC | PRN
  Administered 2014-12-16: 4 mL

## 2014-12-16 MED ORDER — MORPHINE SULFATE 2 MG/ML IJ SOLN
2.0000 mg | INTRAMUSCULAR | Status: DC | PRN
  Administered 2014-12-16: 2 mg via INTRAVENOUS
  Filled 2014-12-16: qty 1

## 2014-12-16 MED ORDER — DEXTROSE 5 % IV SOLN: 500.0000 mg | Freq: Four times a day (QID) | INTRAVENOUS | Status: DC | PRN

## 2014-12-16 MED ORDER — ROCURONIUM BROMIDE 100 MG/10ML IV SOLN
INTRAVENOUS | Status: DC | PRN
  Administered 2014-12-16: 50 mg via INTRAVENOUS

## 2014-12-16 MED ORDER — LACTATED RINGERS IV SOLN
INTRAVENOUS | Status: DC
  Administered 2014-12-16 (×2): via INTRAVENOUS

## 2014-12-16 MED ORDER — MAGNESIUM HYDROXIDE 400 MG/5ML PO SUSP: 30.0000 mL | Freq: Every day | ORAL | Status: DC | PRN

## 2014-12-16 MED ORDER — METOCLOPRAMIDE HCL 10 MG PO TABS: 5.0000 mg | ORAL_TABLET | Freq: Three times a day (TID) | ORAL | Status: DC | PRN

## 2014-12-16 MED ORDER — ALBUTEROL SULFATE HFA 108 (90 BASE) MCG/ACT IN AERS
2.0000 | INHALATION_SPRAY | Freq: Four times a day (QID) | RESPIRATORY_TRACT | Status: DC | PRN
  Filled 2014-12-16: qty 6.7

## 2014-12-16 MED ORDER — FENTANYL CITRATE (PF) 100 MCG/2ML IJ SOLN
INTRAMUSCULAR | Status: DC | PRN
  Administered 2014-12-16: 150 ug via INTRAVENOUS
  Administered 2014-12-16 (×4): 50 ug via INTRAVENOUS

## 2014-12-16 MED ORDER — ALUM & MAG HYDROXIDE-SIMETH 200-200-20 MG/5ML PO SUSP: 30.0000 mL | ORAL | Status: DC | PRN

## 2014-12-16 MED ORDER — DOCUSATE SODIUM 100 MG PO CAPS
100.0000 mg | ORAL_CAPSULE | Freq: Two times a day (BID) | ORAL | Status: DC
  Administered 2014-12-16 – 2014-12-18 (×4): 100 mg via ORAL
  Filled 2014-12-16 (×4): qty 1

## 2014-12-16 MED ORDER — LIDOCAINE HCL (CARDIAC) 20 MG/ML IV SOLN
INTRAVENOUS | Status: DC | PRN
  Administered 2014-12-16: 50 mg via INTRAVENOUS

## 2014-12-16 MED ORDER — ONDANSETRON HCL 4 MG/2ML IJ SOLN: 4.0000 mg | Freq: Four times a day (QID) | INTRAMUSCULAR | Status: DC | PRN

## 2014-12-16 MED ORDER — PHENOL 1.4 % MT LIQD: 1.0000 | OROMUCOSAL | Status: DC | PRN

## 2014-12-16 MED ORDER — METHOCARBAMOL 500 MG PO TABS: 500.0000 mg | ORAL_TABLET | Freq: Four times a day (QID) | ORAL | Status: DC | PRN

## 2014-12-16 MED ORDER — MAGNESIUM CITRATE PO SOLN: 1.0000 | Freq: Once | ORAL | Status: DC | PRN

## 2014-12-16 MED ORDER — ACETAMINOPHEN 325 MG PO TABS
650.0000 mg | ORAL_TABLET | Freq: Four times a day (QID) | ORAL | Status: DC | PRN
  Administered 2014-12-16 – 2014-12-17 (×2): 650 mg via ORAL
  Filled 2014-12-16 (×2): qty 2

## 2014-12-16 MED ORDER — CEFAZOLIN SODIUM-DEXTROSE 2-3 GM-% IV SOLR
2.0000 g | Freq: Four times a day (QID) | INTRAVENOUS | Status: AC
  Administered 2014-12-16 – 2014-12-17 (×3): 2 g via INTRAVENOUS
  Filled 2014-12-16 (×3): qty 50

## 2014-12-16 MED ORDER — CLONAZEPAM 1 MG PO TABS
1.0000 mg | ORAL_TABLET | Freq: Three times a day (TID) | ORAL | Status: DC | PRN
  Administered 2014-12-16 – 2014-12-17 (×2): 2 mg via ORAL
  Filled 2014-12-16 (×2): qty 2

## 2014-12-16 MED ORDER — ZOLPIDEM TARTRATE 5 MG PO TABS: 5.0000 mg | ORAL_TABLET | Freq: Every evening | ORAL | Status: DC | PRN

## 2014-12-16 MED ORDER — ESTRADIOL 1 MG PO TABS
1.0000 mg | ORAL_TABLET | Freq: Every day | ORAL | Status: DC
  Administered 2014-12-16 – 2014-12-18 (×3): 1 mg via ORAL
  Filled 2014-12-16 (×3): qty 1

## 2014-12-16 MED ORDER — DEXAMETHASONE SODIUM PHOSPHATE 4 MG/ML IJ SOLN
INTRAMUSCULAR | Status: DC | PRN
  Administered 2014-12-16: 5 mg via INTRAVENOUS

## 2014-12-16 MED ORDER — METOCLOPRAMIDE HCL 5 MG/ML IJ SOLN: 5.0000 mg | Freq: Three times a day (TID) | INTRAMUSCULAR | Status: DC | PRN

## 2014-12-16 MED ORDER — ACETAMINOPHEN 650 MG RE SUPP: 650.0000 mg | Freq: Four times a day (QID) | RECTAL | Status: DC | PRN

## 2014-12-16 MED ORDER — ALBUTEROL SULFATE HFA 108 (90 BASE) MCG/ACT IN AERS: 2.0000 | INHALATION_SPRAY | Freq: Four times a day (QID) | RESPIRATORY_TRACT | Status: DC | PRN

## 2014-12-16 MED ORDER — CEFAZOLIN SODIUM-DEXTROSE 2-3 GM-% IV SOLR
2.0000 g | Freq: Once | INTRAVENOUS | Status: AC
  Administered 2014-12-16: 2 g via INTRAVENOUS

## 2014-12-16 MED ORDER — BACLOFEN 10 MG PO TABS: 20.0000 mg | ORAL_TABLET | Freq: Four times a day (QID) | ORAL | Status: DC | PRN

## 2014-12-16 MED ORDER — ACETAMINOPHEN 10 MG/ML IV SOLN
INTRAVENOUS | Status: AC
  Filled 2014-12-16: qty 100

## 2014-12-16 MED ORDER — TRAZODONE HCL 100 MG PO TABS
300.0000 mg | ORAL_TABLET | Freq: Every evening | ORAL | Status: DC | PRN
  Administered 2014-12-16: 300 mg via ORAL
  Filled 2014-12-16: qty 3

## 2014-12-16 MED ORDER — FENTANYL CITRATE (PF) 100 MCG/2ML IJ SOLN
25.0000 ug | INTRAMUSCULAR | Status: DC | PRN
  Administered 2014-12-16 (×4): 25 ug via INTRAVENOUS

## 2014-12-16 MED ORDER — ALBUTEROL SULFATE (2.5 MG/3ML) 0.083% IN NEBU: 2.5000 mg | INHALATION_SOLUTION | Freq: Four times a day (QID) | RESPIRATORY_TRACT | Status: DC | PRN

## 2014-12-16 MED ORDER — TRANEXAMIC ACID 1000 MG/10ML IV SOLN
1000.0000 mg | INTRAVENOUS | Status: AC
  Administered 2014-12-16: 1000 mg via INTRAVENOUS
  Filled 2014-12-16: qty 10

## 2014-12-16 MED ORDER — HYDROMORPHONE HCL 1 MG/ML IJ SOLN
INTRAMUSCULAR | Status: AC
  Administered 2014-12-16: 0.5 mg via INTRAVENOUS
  Filled 2014-12-16: qty 1

## 2014-12-16 MED ORDER — BUPIVACAINE-EPINEPHRINE (PF) 0.25% -1:200000 IJ SOLN
INTRAMUSCULAR | Status: AC
  Filled 2014-12-16: qty 30

## 2014-12-16 MED ORDER — BISACODYL 10 MG RE SUPP
10.0000 mg | Freq: Every day | RECTAL | Status: DC | PRN
  Administered 2014-12-18: 10 mg via RECTAL
  Filled 2014-12-16: qty 1

## 2014-12-16 MED ORDER — ONDANSETRON HCL 4 MG PO TABS: 4.0000 mg | ORAL_TABLET | Freq: Four times a day (QID) | ORAL | Status: DC | PRN

## 2014-12-16 MED ORDER — SODIUM CHLORIDE 0.9 % IV SOLN
INTRAVENOUS | Status: DC
Start: 2014-12-16 — End: 2014-12-18
  Administered 2014-12-16 – 2014-12-17 (×2): via INTRAVENOUS

## 2014-12-16 MED ORDER — NEOMYCIN-POLYMYXIN B GU 40-200000 IR SOLN
Status: AC
  Filled 2014-12-16: qty 4

## 2014-12-16 MED ORDER — BUPIVACAINE-EPINEPHRINE 0.25% -1:200000 IJ SOLN
INTRAMUSCULAR | Status: DC | PRN
  Administered 2014-12-16: 30 mL

## 2014-12-16 SURGICAL SUPPLY — 43 items
BLADE SAW 1/2 (BLADE) ×2 IMPLANT
BNDG COHESIVE 6X5 TAN STRL LF (GAUZE/BANDAGES/DRESSINGS) ×4 IMPLANT
CANISTER SUCT 1200ML W/VALVE (MISCELLANEOUS) ×2 IMPLANT
CAPT HIP TOTAL 3 ×2 IMPLANT
CATH FOL LEG HOLDER (MISCELLANEOUS) ×2 IMPLANT
CATH TRAY METER 16FR LF (MISCELLANEOUS) ×2 IMPLANT
CHLORAPREP W/TINT 26ML (MISCELLANEOUS) ×2 IMPLANT
DRAPE C-ARM XRAY 36X54 (DRAPES) ×2 IMPLANT
DRAPE INCISE IOBAN 66X60 STRL (DRAPES) IMPLANT
DRAPE POUCH INSTRU U-SHP 10X18 (DRAPES) ×2 IMPLANT
DRAPE SHEET LG 3/4 BI-LAMINATE (DRAPES) ×6 IMPLANT
DRAPE TABLE BACK 80X90 (DRAPES) ×2 IMPLANT
ELECT BLADE 6.5 EXT (BLADE) ×2 IMPLANT
GAUZE SPONGE 4X4 12PLY STRL (GAUZE/BANDAGES/DRESSINGS) ×2 IMPLANT
GLOVE BIOGEL PI IND STRL 9 (GLOVE) ×1 IMPLANT
GLOVE BIOGEL PI INDICATOR 9 (GLOVE) ×1
GLOVE SURG ORTHO 9.0 STRL STRW (GLOVE) ×2 IMPLANT
GOWN SPECIALTY ULTRA XL (MISCELLANEOUS) ×2 IMPLANT
GOWN STRL REUS W/ TWL LRG LVL3 (GOWN DISPOSABLE) ×1 IMPLANT
GOWN STRL REUS W/TWL LRG LVL3 (GOWN DISPOSABLE) ×1
HEMOVAC 400CC 10FR (MISCELLANEOUS) ×2 IMPLANT
HOOD PEEL AWAY FACE SHEILD DIS (HOOD) ×2 IMPLANT
MAT BLUE FLOOR 46X72 FLO (MISCELLANEOUS) ×2 IMPLANT
NDL SAFETY 18GX1.5 (NEEDLE) ×2 IMPLANT
NEEDLE SPNL 18GX3.5 QUINCKE PK (NEEDLE) ×2 IMPLANT
NS IRRIG 1000ML POUR BTL (IV SOLUTION) ×2 IMPLANT
PACK HIP COMPR (MISCELLANEOUS) ×2 IMPLANT
SOL PREP PVP 2OZ (MISCELLANEOUS) ×2
SOLUTION PREP PVP 2OZ (MISCELLANEOUS) ×1 IMPLANT
STAPLER SKIN PROX 35W (STAPLE) ×2 IMPLANT
STRAP SAFETY BODY (MISCELLANEOUS) ×2 IMPLANT
SUT DVC 2 QUILL PDO  T11 36X36 (SUTURE) ×1
SUT DVC 2 QUILL PDO T11 36X36 (SUTURE) ×1 IMPLANT
SUT DVC QUILL MONODERM 30X30 (SUTURE) ×2 IMPLANT
SUT ETHIBOND NAB CT1 #1 30IN (SUTURE) ×2 IMPLANT
SUT SILK 0 (SUTURE) ×1
SUT SILK 0 30XBRD TIE 6 (SUTURE) ×1 IMPLANT
SUT VIC AB 1 CT1 36 (SUTURE) ×2 IMPLANT
SYR 20CC LL (SYRINGE) ×2 IMPLANT
SYR 30ML LL (SYRINGE) ×2 IMPLANT
TAPE MICROFOAM 4IN (TAPE) ×2 IMPLANT
TUBE KAMVAC SUCTION (TUBING) ×2 IMPLANT
WATER STERILE IRR 1000ML POUR (IV SOLUTION) ×2 IMPLANT

## 2014-12-16 NOTE — Transfer of Care (Signed)
Immediate Anesthesia Transfer of Care Note  Patient: Wendy Johnson  Procedure(s) Performed: Procedure(s): TOTAL HIP ARTHROPLASTY ANTERIOR APPROACH (Left)  Patient Location: PACU  Anesthesia Type:General  Level of Consciousness: sedated  Airway & Oxygen Therapy: Patient Spontanous Breathing and Patient connected to face mask oxygen  Post-op Assessment: Report given to RN  Post vital signs: Reviewed  Last Vitals:  Filed Vitals:   12/16/14 1035  BP: 91/62  Pulse: 65  Temp: 36.3 C  Resp: 13    Complications: No apparent anesthesia complications

## 2014-12-16 NOTE — Op Note (Signed)
12/16/2014  10:35 AM  PATIENT:  Wendy Johnson  49 y.o. female  PRE-OPERATIVE DIAGNOSIS:  primary osteoarthritis of left hip  POST-OPERATIVE DIAGNOSIS:  primary osteoarthritis of left hip  PROCEDURE:  Procedure(s): TOTAL HIP ARTHROPLASTY ANTERIOR APPROACH (Left)  SURGEON: Laurene Footman, MD  ASSISTANTS: None  ANESTHESIA:   spinal  EBL:  Total I/O In: 750 [I.V.:750] Out: 850 [Urine:100; Blood:750]  BLOOD ADMINISTERED:none  DRAINS: none   LOCAL MEDICATIONS USED:  MARCAINE     SPECIMEN:  Source of Specimen:  Femoral head  DISPOSITION OF SPECIMEN:  PATHOLOGY  COUNTS:  YES  TOURNIQUET:  * No tourniquets in log *  IMPLANTS:  Medacta AMIS 5 stem, 52 mm Mpact cup DM, liner and S head  DICTATION: .Dragon Dictation   The patient was brought to the operating room and after Generalesthesia was obtained Patientlaced on the operative table with the ipsilateral foot into the Medacta attachment, contralateral leg on a well-padded table. C-arm was brought in and preop template x-ray taken. After prepping and draping in usual sterile fashion appropriate patient identification and timeout procedures were completed. Anterior approach to the hip was obtained and centered over the greater trochanter and TFL muscle. The subcutaneous tissue was incised hemostasis being achieved by electrocautery. TFL fascia was incised and the muscle retracted laterally deep retractor placed. The lateral femoral circumflex vessels were identified and ligated. The anterior capsule was exposed and a capsulotomy performed. The neck was identified and a femoral neck cut carried out with a saw. The head was removed without difficulty and showed mild degenerative changes to the  femoral head and acetabulum. Reaming was carried out to 52 mm and a 52 mm cup trial gave appropriate tightness to the acetabular component a 52 cup was impacted into position. The leg was then externally rotated and ischiofemoral and patellofemoral  releases carried out. The femur was sequentially broached to a size 5, size 5 and S head  trials were placed and the final components chosen. The 5 stem was inserted along with a S 28 mm head and  9m liner. The hip was reduced and was stable the wound was thoroughly irrigated with a dilute Betadine solution. The deep fascia view. Using a heavy Quill after infiltration of 30 cc of quarter percent Sensorcaine with epinephrine. 2-0 Quill to close the skin with skin staples Xeroform 4 x 4's ABDs and tape patient was sent to recovery in stable condition complications none specimen removed femoral head issue comes stable.  PLAN OF CARE: Admit to inpatient

## 2014-12-16 NOTE — Anesthesia Procedure Notes (Signed)
Procedure Name: Intubation Date/Time: 12/16/2014 8:48 AM Performed by: Rolla Plate Pre-anesthesia Checklist: Patient identified, Patient being monitored, Timeout performed, Emergency Drugs available and Suction available Patient Re-evaluated:Patient Re-evaluated prior to inductionOxygen Delivery Method: Circle system utilized Preoxygenation: Pre-oxygenation with 100% oxygen Intubation Type: IV induction Ventilation: Mask ventilation without difficulty Laryngoscope Size: Miller and 2 Grade View: Grade II Tube type: Oral Tube size: 7.0 mm Number of attempts: 1 Airway Equipment and Method: Stylet Placement Confirmation: ETT inserted through vocal cords under direct vision,  positive ETCO2 and breath sounds checked- equal and bilateral Secured at: 20 cm Tube secured with: Tape Dental Injury: Teeth and Oropharynx as per pre-operative assessment

## 2014-12-16 NOTE — Progress Notes (Signed)
Applied ted hose and heels off bed

## 2014-12-16 NOTE — Progress Notes (Signed)
DR [facility] NOTIFIED PT DOES NOT WANT TO TAKE ADDERRALL  [place]. DENIES NEED FOR NICOTINE

## 2014-12-16 NOTE — Anesthesia Preprocedure Evaluation (Addendum)
Anesthesia Evaluation  Patient identified by MRN, date of birth, ID band Patient awake    Reviewed: Allergy & Precautions, NPO status , Patient's Chart, lab work & pertinent test results  Airway Mallampati: III  TM Distance: >3 FB Neck ROM: Full    Dental  (+) Chipped   Pulmonary COPD COPD inhaler, Current Smoker,  breath sounds clear to auscultation  Pulmonary exam normal       Cardiovascular hypertension, Normal cardiovascular exam    Neuro/Psych  Headaches, Anxiety Depression Bipolar Disorder Decreased hearing    GI/Hepatic GERD-  Medicated and Controlled,Fatty liver   Endo/Other    Renal/GU   negative genitourinary   Musculoskeletal  (+) Arthritis -, Osteoarthritis,  Facet issues   Abdominal Normal abdominal exam  (+)   Peds negative pediatric ROS (+)  Hematology   Anesthesia Other Findings   Reproductive/Obstetrics                            Anesthesia Physical Anesthesia Plan  ASA: II  Anesthesia Plan: General   Post-op Pain Management:    Induction: Intravenous  Airway Management Planned: Oral ETT  Additional Equipment:   Intra-op Plan:   Post-operative Plan: Extubation in OR  Informed Consent: I have reviewed the patients History and Physical, chart, labs and discussed the procedure including the risks, benefits and alternatives for the proposed anesthesia with the patient or authorized representative who has indicated his/her understanding and acceptance.   Dental advisory given  Plan Discussed with: CRNA and Surgeon  Anesthesia Plan Comments:         Anesthesia Quick Evaluation

## 2014-12-16 NOTE — Clinical Social Work Note (Signed)
Clinical Social Work Assessment  Patient Details  Name: Wendy Johnson MRN: [id number] Date of Birth: [birth year]  Date of referral:  12/16/14               Reason for consult:  Facility Placement                Permission sought to share information with:    Permission granted to share information::  No  Name::      [place]::   [place]   Relationship::     Contact Information:     Housing/Transportation Living arrangements for the past 2 months:  [place] of Information:  Patient Patient Interpreter Needed:  None Criminal Activity/Legal Involvement Pertinent to Current Situation/Hospitalization:  No - Comment as needed Significant Relationships:  Significant Other Lives with:  Significant Other Do you feel safe going back to the place where you live?  Yes Need for family participation in patient care:  Yes (Comment)  Care giving concerns: Patient lives with her boyfriend Broadus John in [place].    Social Worker assessment / plan: [place] representative (CSW) received SNF consult. Patient had surgery today and will start with PT tomorrow. CSW met with patient to address D/C plan. Patient reported that she lives in [place] with her boyfriend Broadus John. CSW explained that PT will work with patient tomorrow and recommend home health or SNF. Patient reported that she is going home and not to rehab.   FL2 complete and on chart. CSW will continue to follow and assist as needed.   Employment status:  Disabled (Comment on whether or not currently receiving Disability) Insurance information:  Medicare, Medicaid [place] PT Recommendations:  Not assessed at this time Information / Referral to community resources:  [place]  Patient/Family's Response to care: Patient prefers to go home.   Patient/Family's Understanding of and Emotional Response to Diagnosis, Current Treatment, and Prognosis: Patient thanked CSW for visit.    Emotional Assessment Appearance:  Appears older than stated age Attitude/Demeanor/Rapport:    Affect (typically observed):  Quiet, Flat Orientation:  Oriented to Self, Oriented to Place, Oriented to  Time, Oriented to Situation Alcohol / Substance use:  Not Applicable Psych involvement (Current and /or in the community):  No (Comment)  Discharge Needs  Concerns to be addressed:  Discharge Planning Concerns Readmission within the last 30 days:  No Current discharge risk:  Chronically ill Barriers to Discharge:  Continued Medical Work up   Loralyn Freshwater, LCSW 12/16/2014, 3:02 PM

## 2014-12-16 NOTE — Anesthesia Postprocedure Evaluation (Signed)
  Anesthesia Post-op Note  Patient: Wendy Johnson  Procedure(s) Performed: Procedure(s): TOTAL HIP ARTHROPLASTY ANTERIOR APPROACH (Left)  Anesthesia type:General  Patient location: PACU  Post pain: Pain level controlled  Post assessment: Post-op Vital signs reviewed, Patient's Cardiovascular Status Stable, Respiratory Function Stable, Patent Airway and No signs of Nausea or vomiting  Post vital signs: Reviewed and stable  Last Vitals:  Filed Vitals:   12/16/14 1629  BP: 120/69  Pulse: 74  Temp: 36.6 C  Resp: 16    Level of consciousness: awake, alert  and patient cooperative  Complications: No apparent anesthesia complications.  Patient did not want a regional block.

## 2014-12-16 NOTE — H&P (Signed)
Reviewed paper H+P, will be scanned into chart. No changes noted.  

## 2014-12-17 LAB — BASIC METABOLIC PANEL
Anion gap: 7 (ref 5–15)
CHLORIDE: 103 mmol/L (ref 101–111)
CO2: 28 mmol/L (ref 22–32)
Calcium: 8.4 mg/dL — ABNORMAL LOW (ref 8.9–10.3)
Creatinine, Ser: 0.52 mg/dL (ref 0.44–1.00)
GFR calc Af Amer: >60 mL/min (ref >60)
Glucose, Bld: 117 mg/dL — ABNORMAL HIGH (ref 65–99)
POTASSIUM: 3.6 mmol/L (ref 3.5–5.1)
SODIUM: 138 mmol/L (ref 135–145)

## 2014-12-17 MED ORDER — ENOXAPARIN SODIUM 40 MG/0.4ML ~~LOC~~ SOLN: 40.0000 mg | SUBCUTANEOUS | Status: DC

## 2014-12-17 MED ORDER — OXYCODONE HCL 5 MG PO TABS: 5.0000 mg | ORAL_TABLET | ORAL | Status: DC | PRN

## 2014-12-17 NOTE — Discharge Summary (Signed)
Physician Discharge Summary  Patient ID: Wendy Johnson MRN: [id number] DOB/AGE: [birth year] 47 y.o.  Admit date: 12/16/2014 Discharge date: 12/17/2014  Admission Diagnoses:  LEFT HIP OSTEOARTHRITIS   Discharge Diagnoses: Patient Active Problem List   Diagnosis Date Noted  . Primary osteoarthritis of left hip 12/16/2014  . Metabolic syndrome 26/37/8588  . Migraine without aura and without status migrainosus, not intractable 11/26/2014  . GERD without esophagitis 11/26/2014  . COPD, moderate 11/26/2014  . Nocturnal oxygen desaturation 11/26/2014  . Supplemental oxygen dependent 11/26/2014  . Hearing loss 11/26/2014  . Chronic insomnia 11/26/2014  . History of hypertension 11/26/2014  . Dyslipidemia 11/26/2014  . History of morbid obesity 11/26/2014  . History of diabetes mellitus, type II 11/26/2014  . Chronic constipation 11/26/2014  . Generalized anxiety disorder 11/11/2014  . DDD (degenerative disc disease), lumbar 11/11/2014  . Facet arthritis of lumbar region 11/11/2014  . Primary osteoarthritis involving multiple joints 11/11/2014  . H/O hysterectomy for benign disease 10/20/2014  . History of endometriosis 10/16/2014  . Low back derangement syndrome 09/30/2014  . Degenerative joint disease of both lower legs 09/30/2014  . Bipolar disorder 09/30/2014    Past Medical History  Diagnosis Date  . High cholesterol   . GERD (gastroesophageal reflux disease)   . Anxiety   . Depression   . OCD (obsessive compulsive disorder)   . Chronic back pain   . Chronic constipation   . Severe obesity   . AR (allergic rhinitis)   . Trochanteric bursitis of right hip   . Tobacco use   . Hepatomegaly   . Dyslipidemia   . COPD (chronic obstructive pulmonary disease)   . Decreased dorsalis pedis pulse   . Fatty liver   . Chronic insomnia   . Benign hypertension   . Deaf   . Hot flashes   . Migraine with aura        Consultants (if any):   none  Discharged Condition:  Improved  Hospital Course: Wendy Johnson is an 49 y.o. female who was admitted 12/16/2014 with a diagnosis of left hip osteoarthritis and went to the operating room on 12/16/2014 and underwent the above named procedures.    Surgeries: Procedure(s): TOTAL HIP ARTHROPLASTY ANTERIOR APPROACH on 12/16/2014 Patient tolerated the surgery well. Taken to PACU where she was stabilized and then transferred to the orthopedic floor.  Started on Lovenox 40 q 24 hrs. Foot pumps applied bilaterally at 80 mm. Heels elevated on bed with rolled towels. No evidence of DVT. Negative Homan. Physical therapy started on day #1 for gait training and transfer. OT started day #1 for ADL and assisted devices.  Patient's foley was d/c on day #1. Patient's IV and hemovac was d/c on day #2.  On post op day #2 patient was stable and ready for discharge to home with home health PT.  Implants: Medacta AMIS 5 stem, 52 mm Mpact cup DM, liner and S head  She was given perioperative antibiotics:  Anti-infectives    Start     Dose/Rate Route Frequency Ordered Stop   12/16/14 1500  ceFAZolin (ANCEF) IVPB 2 g/50 mL premix     2 g 100 mL/hr over 30 Minutes Intravenous 4 times per day 12/16/14 1210 12/17/14 0535   12/16/14 0730  ceFAZolin (ANCEF) IVPB 2 g/50 mL premix     2 g 100 mL/hr over 30 Minutes Intravenous  Once 12/16/14 0728 12/16/14 0909   12/16/14 0724  ceFAZolin (ANCEF) 2-3 GM-% IVPB SOLR  CommentsHermenia Johnson: cabinet override      12/16/14 0724 12/16/14 1929    .  She was given sequential compression devices, early ambulation, and lovenox for DVT prophylaxis.  She benefited maximally from the hospital stay and there were no complications.    Recent vital signs:  Filed Vitals:   12/17/14 1551  BP: 104/78  Pulse: 88  Temp: 98.3 F (36.8 C)  Resp: 16    Recent laboratory studies:  Lab Results  Component Value Date   HGB 13.3 12/16/2014   HGB 14.6 12/02/2014   HGB 12.6 10/21/2014   Lab  Results  Component Value Date   WBC 16.6* 12/16/2014   PLT 189 12/16/2014   Lab Results  Component Value Date   INR 1.02 12/02/2014   Lab Results  Component Value Date   NA 138 12/17/2014   K 3.6 12/17/2014   CL 103 12/17/2014   CO2 28 12/17/2014   BUN <5* 12/17/2014   CREATININE 0.52 12/17/2014   GLUCOSE 117* 12/17/2014    Discharge Medications:     Medication List    TAKE these medications        amphetamine-dextroamphetamine 30 MG tablet  Commonly known as:  ADDERALL  Take 30 mg by mouth 2 (two) times daily.     ARIPiprazole 15 MG tablet  Commonly known as:  ABILIFY  Take 15 mg by mouth daily.     baclofen 20 MG tablet  Commonly known as:  LIORESAL  Take 1 tablet (20 mg total) by mouth every 6 (six) hours as needed for muscle spasms.     busPIRone 10 MG tablet  Commonly known as:  BUSPAR  Take 20 mg by mouth 2 (two) times daily.     clonazePAM 1 MG tablet  Commonly known as:  KLONOPIN  Take 1-2 mg by mouth 3 (three) times daily as needed for anxiety.     dexlansoprazole 60 MG capsule  Commonly known as:  DEXILANT  Take 1 capsule (60 mg total) by mouth daily.     enoxaparin 40 MG/0.4ML injection  Commonly known as:  LOVENOX  Inject 0.4 mLs (40 mg total) into the skin daily.     estradiol 1 MG tablet  Commonly known as:  ESTRACE  Take 1 tablet (1 mg total) by mouth daily.     ibuprofen 800 MG tablet  Commonly known as:  ADVIL,MOTRIN  Take 800 mg by mouth every 8 (eight) hours as needed for moderate pain.     multivitamin with minerals Tabs tablet  Take 1 tablet by mouth daily.     Oxycodone HCl 10 MG Tabs  Take 1 tablet (10 mg total) by mouth 3 (three) times daily before meals.     oxyCODONE 5 MG immediate release tablet  Commonly known as:  Oxy IR/ROXICODONE  Take 1-2 tablets (5-10 mg total) by mouth every 3 (three) hours as needed for breakthrough pain.     pravastatin 40 MG tablet  Commonly known as:  PRAVACHOL  Take 1 tablet (40 mg total)  by mouth daily.     PROAIR HFA 108 (90 BASE) MCG/ACT inhaler  Generic drug:  albuterol  Inhale 2 puffs into the lungs every 6 (six) hours as needed.     sertraline 50 MG tablet  Commonly known as:  ZOLOFT  Take 100 mg by mouth 2 (two) times daily.     tiotropium 18 MCG inhalation capsule  Commonly known as:  SPIRIVA  Place 18 mcg  into inhaler and inhale daily.     traZODone 150 MG tablet  Commonly known as:  DESYREL  Take 300 mg by mouth at bedtime as needed for sleep.        Diagnostic Studies: Dg Hip Operative Unilat With Pelvis Left  12/16/2014   CLINICAL DATA:  Left hip replacement  EXAM: OPERATIVE left HIP (WITH PELVIS IF PERFORMED) 2 VIEWS  TECHNIQUE: Fluoroscopic spot image(s) were submitted for interpretation post-operatively.  COMPARISON:  MRI 09/26/2014  FINDINGS: Left hip hemiarthroplasty in satisfactory position alignment. No immediate complication.  IMPRESSION: Left hip hemiarthroplasty.   Electronically Signed   By: Franchot Gallo M.D.   On: 12/16/2014 10:29   Dg Hip Unilat W Or W/o Pelvis 2-3 Views Left  12/16/2014   CLINICAL DATA:  Postop left hip  EXAM: DG HIP (WITH OR WITHOUT PELVIS) 2-3V LEFT  COMPARISON:  MRI 09/26/2014.  FINDINGS: Left hip arthroplasty in satisfactory position and alignment. No fracture or dislocation.  IMPRESSION: Satisfactory left hip replacement.   Electronically Signed   By: Franchot Gallo M.D.   On: 12/16/2014 11:25    Disposition: 01-Home or Self Care        Follow-up Information    Follow up with MENZ,MICHAEL, MD In 2 weeks.   Specialty:  Orthopedic Surgery   Why:  For suture removal, For wound re-check   Contact information:   [place] [address] [phone]        Signed: Feliberto Gottron 12/17/2014, 5:38 PM

## 2014-12-17 NOTE — Care Management Note (Addendum)
Case Management Note  Patient Details  Name: Wendy Johnson MRN: [id number] Date of Birth: [birth year]  Subjective/Objective:                  Met with patient to discuss discharge planning. She could not stay awake and has slurred speech/requesting more pain Rx. She states that she "did not like her last home health agency" but could not state name of agency. She picked [facility] for home health this visit. She states she uses [phone]. She states she has a front-wheeled walker but then went to sleep  Action/Plan: .Referral called to [facility] with [place]. Lovenox 46m #14 called in to [facility] RNCM will continue to follow.   Expected Discharge Date:                  Expected Discharge Plan:     In-House Referral:     Discharge planning Services  CM Consult  Post Acute Care Choice:  Home Health Choice offered to:  Patient  DME Arranged:    DME Agency:  NA  HH Arranged:  [place]  [facility] Status of Service:  In process, will continue to follow  Medicare Important Message Given:    Date Medicare IM Given:    Medicare IM give by:    Date Additional Medicare IM Given:    Additional Medicare Important Message give by:     If discussed at [place] Meet[place], dates discussed:    Additional Comments:  AMarshell Garfinkel RN 12/17/2014, 1:29 PM

## 2014-12-17 NOTE — Progress Notes (Addendum)
   Subjective: 1 Day Post-Op Procedure(s) (LRB): TOTAL HIP ARTHROPLASTY ANTERIOR APPROACH (Left) Patient reports pain as moderate.   Patient is well, and has had no acute complaints or problems. Patient sleeping. We will start therapy today.  Plan is to go Home after hospital stay.  Objective: Vital signs in last 24 hours: Temp:  [97.3 F (36.3 C)-98.6 F (37 C)] 98.5 F (36.9 C) (08/10 0745) Pulse Rate:  [51-91] 91 (08/10 0745) Resp:  [12-22] 16 (08/10 0745) BP: (91-123)/(57-88) 114/57 mmHg (08/10 0745) SpO2:  [92 %-100 %] 92 % (08/10 0745) Weight:  [93.622 kg (206 lb 6.4 oz)] 93.622 kg (206 lb 6.4 oz) (08/09 1211)  Intake/Output from previous day: 08/09 0701 - 08/10 0700 In: 3968 [P.O.:1182; I.V.:2025; IV Piggyback:50] Out: 7915 [Urine:7225; Blood:750] Intake/Output this shift:     Recent Labs  12/16/14 1256  HGB 13.3    Recent Labs  12/16/14 1256  WBC 16.6*  RBC 4.75  HCT 40.9  PLT 189    Recent Labs  12/16/14 1256  CREATININE 0.65   No results for input(s): LABPT, INR in the last 72 hours.  EXAM General - Patient is Alert, Appropriate and Oriented Extremity - Neurovascular intact Sensation intact distally Intact pulses distally Dorsiflexion/Plantar flexion intact No cellulitis present Dressing - dressing C/D/I and no drainage Motor Function - intact, moving foot and toes well on exam.   Past Medical History  Diagnosis Date  . High cholesterol   . GERD (gastroesophageal reflux disease)   . Anxiety   . Depression   . OCD (obsessive compulsive disorder)   . Chronic back pain   . Chronic constipation   . Severe obesity   . AR (allergic rhinitis)   . Trochanteric bursitis of right hip   . Tobacco use   . Hepatomegaly   . Dyslipidemia   . COPD (chronic obstructive pulmonary disease)   . Decreased dorsalis pedis pulse   . Fatty liver   . Chronic insomnia   . Benign hypertension   . Deaf   . Hot flashes   . Migraine with aura      Assessment/Plan:   1 Day Post-Op Procedure(s) (LRB): TOTAL HIP ARTHROPLASTY ANTERIOR APPROACH (Left) Active Problems:   Primary osteoarthritis of left hip  Estimated body mass index is 35.41 kg/(m^2) as calculated from the following:   Height as of this encounter: 5\' 4"  (1.626 m).   Weight as of this encounter: 93.622 kg (206 lb 6.4 oz). Advance diet Up with therapy  Recheck labs in the am Needs BM before discharge  DVT Prophylaxis - Lovenox, Foot Pumps and TED hose Weight-Bearing as tolerated to left leg D/C O2 and Pulse OX and try on Room Air  T. Rachelle Hora, PA-C [facility] 12/17/2014, 7:51 AM

## 2014-12-17 NOTE — Progress Notes (Signed)
Patient cooperative with care.  Able to void post foley removal.  Patient up to chair during physical therapy.  Patient moves well enough to go home with HHPT.  Did not require any additional po pain medication other than the schedule oxycodone.

## 2014-12-17 NOTE — Clinical Social Work Note (Signed)
PT has recommended Home Health PT and this is what patient is wanting. RN CM to arrange. Please reconsult CSW if any further needs arise. Shela Leff MSW,LCSW [phone]

## 2014-12-17 NOTE — Evaluation (Signed)
Occupational Therapy Evaluation Patient Details Name: Wendy Johnson MRN: [id number] DOB: Oct 20, 1965 Today's Date: 12/17/2014    History of Present Illness This patient is a 49 year old female who came to [facility] for a L THR anterior approach.   Clinical Impression   This patient is a 49 year old female who came to [facility] for a left total hip replacement (anterior approach).  Patient lives in a 2 story home with 2 steps to enter with a rail.  She can stay on the first floor.  She had been independent with ADL and functional mobility. She now requires assistance and would benefit from Occupational Therapy for ADL/functional mobility training while .staying within hip precautioins (anterior approach).      Follow Up Recommendations       Equipment Recommendations       Recommendations for Other Services       Precautions / Restrictions Precautions Precautions: Anterior Hip Restrictions Weight Bearing Restrictions: Yes LLE Weight Bearing: Weight bearing as tolerated      Mobility Bed Mobility            Balance                              ADL                                         General ADL Comments: Patient had been indepenent with basic ADL. Patient  Donned/doffed socks and put on a skirt with minimal assist and extra time to complete. She needed verbal cues for safety.      Vision     Perception     Praxis      Pertinent Vitals/Pain Pain Assessment: 0-10 Pain Score: 7  Pain Location: left hip      Hand Dominance Right   Extremity/Trunk Assessment Upper Extremity Assessment Upper Extremity Assessment: Overall WFL for tasks assessed         Communication Communication Communication: No difficulties   Cognition Arousal/Alertness: Awake/alert Behavior During Therapy: WFL for tasks assessed/performed Overall Cognitive Status: Within Functional Limits for tasks assessed                      General Comments       Exercises     Shoulder Instructions      Home Living Family/patient expects to be discharged to:: Private residence Living Arrangements: Spouse/significant other (Boyfriend and boyfriend's son) Available Help at Discharge: Family;Friend(s) Type of Home: House Home Access: Stairs to enter [employer] of Steps: 2 Entrance Stairs-Rails: Left Home Layout: Multi-level;Able to live on main level with bedroom/bathroom               Home Equipment: Walker - standard;Cane - quad          Prior Functioning/Environment Level of Independence: Independent        Comments: Does not drive    OT Diagnosis: Acute pain   OT Problem List: Decreased activity tolerance;Pain   OT Treatment/Interventions: Self-care/ADL training    OT Goals(Current goals can be found in the care plan section) Acute Rehab OT Goals Patient Stated Goal: To go home OT Goal Formulation: With patient/family Time For Goal Achievement: 12/31/14 Potential to Achieve Goals: Good  OT Frequency: Min 1X/week   Barriers to D/C:  Co-evaluation              End of Session Equipment Utilized During Treatment:  (Hip kit)  Activity Tolerance:   Patient left: in chair;with call bell/phone within reach;with chair alarm set;with family/visitor present   Time: 7793-9688 OT Time Calculation (min): 20 min Charges:  OT General Charges $OT Visit: 1 Procedure OT Evaluation $Initial OT Evaluation Tier I: 1 Procedure OT Treatments $Self Care/Home Management : 8-22 mins G-Codes:    Myrene Galas, MS/OTR/L  12/17/2014, 10:10 AM

## 2014-12-17 NOTE — Discharge Instructions (Signed)

## 2014-12-17 NOTE — Progress Notes (Signed)
Physical Therapy Treatment Patient Details Name: Wendy Johnson MRN: [id number] DOB: 1966/01/02 Today's Date: 12/17/2014    History of Present Illness Pt underwent L THA anterior approach and is POD#1 at time of evaluation. No reported post-op complications    PT Comments    Pt demonstrates excellent progress with therapy today. She discussed possible DC on POD#2. It appears that pt will be appropriate to discharge on POD#2. She will need to complete a lap around RN station and most importantly practice stairs before discharge. Will attempt to complete these goals during AM session tomorrow. Pt is able to increase her ambulation distance, speed, and gait quality this afternoon. Pt will benefit from skilled PT services to address deficits in strength, balance, and mobility in order to return to full function at home.    Follow Up Recommendations  Home health PT     Equipment Recommendations   [facility] for her standard walker or a rolling walker)    Recommendations for Other Services       Precautions / Restrictions Precautions Precautions: Anterior Hip Restrictions Weight Bearing Restrictions: Yes LLE Weight Bearing: Weight bearing as tolerated    Mobility  Bed Mobility Overal bed mobility: Modified Independent Bed Mobility: Supine to Sit     Supine to sit: Modified independent (Device/Increase time)     General bed mobility comments: Pt uses HOB elevated and rails but does not need assist from therapist  Transfers Overall transfer level: Needs assistance Equipment used: Rolling walker (2 wheeled) Transfers: Sit to/from Stand Sit to Stand: Min guard         General transfer comment: Pt demonstrates good positioning, safety, and sequencing. She does not require cues this afternoon to rest knees on bed prior to descent. Pt still requires increased time to perform due to pain and weakness.  Ambulation/Gait Ambulation/Gait assistance: Min guard Ambulation Distance  (Feet): 125 Feet Assistive device: Rolling walker (2 wheeled) Gait Pattern/deviations: Decreased step length - right;Decreased stance time - left;Decreased weight shift to left Gait velocity: 10'=12.0 sec Gait velocity interpretation: <1.8 ft/sec, indicative of risk for recurrent falls General Gait Details: Pt requires education initially about proper sequencing. She is able to quickly progress to reciprocal gait pattern. Cues to improve posture and increase WB through bilateral LEs. Pt demonstrates improving speed and sequencing   Stairs            Wheelchair Mobility    Modified Rankin (Stroke Patients Only)       Balance Overall balance assessment: Needs assistance   Sitting balance-Leahy Scale: Fair       Standing balance-Leahy Scale: Poor                      Cognition Arousal/Alertness: Lethargic Behavior During Therapy: WFL for tasks assessed/performed Overall Cognitive Status: Within Functional Limits for tasks assessed       Memory: Decreased recall of precautions (Pt recalls 2/3 positions for anterior precautions)              Exercises Total Joint Exercises Ankle Circles/Pumps: Strengthening;Both;15 reps;Seated Towel Squeeze: Strengthening;Both;Seated;15 reps Hip ABduction/ADduction: Strengthening;Both;15 reps;Seated Long Arc Quad: Strengthening;Seated;Both;15 reps Knee Flexion: Strengthening;Both;15 reps;Seated Marching in Standing: Strengthening;Both;15 reps;Seated    General Comments        Pertinent Vitals/Pain Pain Assessment: 0-10 Pain Score: 5  Pain Location: L hip    Home Living  Prior Function            PT Goals (current goals can now be found in the care plan section) Acute Rehab PT Goals Patient Stated Goal: To go home PT Goal Formulation: With patient Time For Goal Achievement: 12/31/14 Potential to Achieve Goals: Good Progress towards PT goals: Progressing toward goals     Frequency  BID    PT Plan Current plan remains appropriate    Co-evaluation             End of Session Equipment Utilized During Treatment: Gait belt Activity Tolerance: Patient tolerated treatment well Patient left: with call bell/phone within reach;in bed;with bed alarm set;with SCD's reapplied     Time: 1414-     Charges:  $Therapeutic Exercise: 8-22 mins                    G Codes:      Lyndel Safe Huprich PT, DPT   Huprich,Jason 12/17/2014, 2:51 PM

## 2014-12-17 NOTE — Evaluation (Signed)
Physical Therapy Evaluation Patient Details Name: [name] MRN: [id number] DOB: 1965-11-01 Today's Date: 12/17/2014   History of Present Illness  Pt underwent L THA anterior approach and is POD#1 at time of evaluation. No reported post-op complications  Clinical Impression  Pt reports high levels of pain with exercise and ambulation. However she is able to complete the entire evaluation without issue. Pt will be safe to return home with [facility] PT at discharge. Pt will benefit from skilled PT services to address deficits in strength, balance, and mobility in order to return to full function at home.     Follow Up Recommendations Home health PT    Equipment Recommendations   [facility] for her standard walker or a rolling walker)    Recommendations for Other Services       Precautions / Restrictions Precautions Precautions: Anterior Hip (Left) Restrictions Weight Bearing Restrictions: Yes LLE Weight Bearing: Weight bearing as tolerated (LLE)      Mobility  Bed Mobility Overal bed mobility: Needs Assistance Bed Mobility: Supine to Sit     Supine to sit: Min assist     General bed mobility comments: Assist to abduct LLE, assist for sidelying to sitting  Transfers Overall transfer level: Needs assistance Equipment used: Rolling walker (2 wheeled) Transfers: Sit to/from Stand Sit to Stand: Min assist         General transfer comment: Cues for positioning, sequencing, and hand placement  Ambulation/Gait Ambulation/Gait assistance: Min guard Ambulation Distance (Feet): 25 Feet Assistive device: Rolling walker (2 wheeled) Gait Pattern/deviations: Step-to pattern;Decreased step length - right;Decreased stance time - left;Decreased weight shift to left;Antalgic   Gait velocity interpretation: <1.8 ft/sec, indicative of risk for recurrent falls General Gait Details: Cues and instruction for proper sequencing and turns. Pt reports increase in L hip pain to 10/10 with  ambulation. Gait is initially slow but speed and step length increase throughout distance. Upon return to chair IV connection becomes loose and RN notified to reconnect  Stairs            Wheelchair Mobility    Modified Rankin (Stroke Patients Only)       Balance Overall balance assessment: Needs assistance   Sitting balance-Leahy Scale: Fair       Standing balance-Leahy Scale: Poor                               Pertinent Vitals/Pain Pain Assessment: 0-10 Pain Score: 7  (Increases to 10/10 with ambulation) Pain Location: L hip Pain Intervention(s): Limited activity within patient's tolerance;Monitored during session;Premedicated before session    Home Living Family/patient expects to be discharged to:: Private residence Living Arrangements: Spouse/significant other (Boyfriend and boyfriend's son) Available Help at Discharge: Family;Friend(s) Type of Home: House Home Access: Stairs to enter Entrance Stairs-Rails: Left Entrance Stairs-Number of Steps: 2 Home Layout: Multi-level;Able to live on main level with bedroom/bathroom Home Equipment: Walker - standard;Cane - quad      Prior Function Level of Independence: Independent         Comments: Does not drive     Hand Dominance   Dominant Hand: Right    Extremity/Trunk Assessment   Upper Extremity Assessment: Overall WFL for tasks assessed           Lower Extremity Assessment: LLE deficits/detail   LLE Deficits / Details: Able to perform partial SLR and full LAQ without assistance. Full DF/PF against resistance. Pt denies numbness/tingling in LLE  Communication   Communication: No difficulties  Cognition Arousal/Alertness: Awake/alert Behavior During Therapy: WFL for tasks assessed/performed Overall Cognitive Status: Within Functional Limits for tasks assessed                      General Comments      Exercises Total Joint Exercises Ankle Circles/Pumps:  Strengthening;Both;10 reps;Supine Quad Sets: Strengthening;Both;10 reps;Supine Gluteal Sets: Strengthening;Both;10 reps;Supine Towel Squeeze: Strengthening;Both;10 reps;Supine Short Arc Quad: Strengthening;Left;10 reps;Supine Heel Slides: Strengthening;10 reps;Left;Supine Hip ABduction/ADduction: Strengthening;Both;10 reps;Supine Straight Leg Raises: Strengthening;Both;10 reps;Supine Long Arc Quad: Strengthening;Left;10 reps;Seated      Assessment/Plan    PT Assessment Patient needs continued PT services  PT Diagnosis Difficulty walking;Abnormality of gait;Generalized weakness;Acute pain   PT Problem List Decreased strength;Decreased activity tolerance;Decreased balance;Decreased mobility;Decreased coordination;Decreased cognition;Decreased knowledge of use of DME;Pain  PT Treatment Interventions DME instruction;Gait training;Stair training;Functional mobility training;Therapeutic activities;Therapeutic exercise;Neuromuscular re-education;Balance training   PT Goals (Current goals can be found in the Care Plan section) Acute Rehab PT Goals Patient Stated Goal: "I want to go home" PT Goal Formulation: With patient Time For Goal Achievement: 12/31/14 Potential to Achieve Goals: Good    Frequency BID   Barriers to discharge   .    Co-evaluation               End of Session Equipment Utilized During Treatment: Gait belt Activity Tolerance: Patient tolerated treatment well Patient left: in chair;with call bell/phone within reach;with chair alarm set;with nursing/sitter in room;with family/visitor present Nurse Communication: Mobility status (Need to connect IV)         Time: 0347-4259 PT Time Calculation (min) (ACUTE ONLY): 32 min   Charges:   PT Evaluation $Initial PT Evaluation Tier I: 1 Procedure PT Treatments $Therapeutic Exercise: 8-22 mins   PT G Codes:       Lyndel Safe Huprich PT, DPT   Huprich,Jason 12/17/2014, 9:43 AM

## 2014-12-18 LAB — CBC
HCT: 31.7 % — ABNORMAL LOW (ref 35.0–47.0)
Hemoglobin: 10.9 g/dL — ABNORMAL LOW (ref 12.0–16.0)
MCH: 29.3 pg (ref 26.0–34.0)
MCHC: 34.3 g/dL (ref 32.0–36.0)
MCV: 85.4 fL (ref 80.0–100.0)
Platelets: 153 10*3/uL (ref 150–440)
RBC: 3.71 MIL/uL — ABNORMAL LOW (ref 3.80–5.20)
RDW: 15.2 % — ABNORMAL HIGH (ref 11.5–14.5)
WBC: 9.3 10*3/uL (ref 3.6–11.0)

## 2014-12-18 LAB — SURGICAL PATHOLOGY

## 2014-12-18 NOTE — Care Management (Signed)
Lovenox $1.20. Patient discharging home today. I have notified Corene Cornea with [facility]. No further RNCM needs Case closed.

## 2014-12-18 NOTE — Progress Notes (Signed)
Per RN Case Manager patient is going home today with home health. Please reconsult if future social work needs arise. CSW signing off.   Blima Rich, [place] [phone]

## 2014-12-18 NOTE — Progress Notes (Signed)
Patient discharged to home with [facility]. IV's removed, discharge & medication instructions given. Mother here to drive her home.

## 2014-12-18 NOTE — Progress Notes (Signed)
  Subjective: 2 Days Post-Op Procedure(s) (LRB): TOTAL HIP ARTHROPLASTY ANTERIOR APPROACH (Left) Patient reports pain as mild.   Patient seen in rounds with Dr. Rudene Christians. Patient is well, and has had no acute complaints or problems Plan is to go Home after hospital stay. The patient is ambulating 125 feet with physical therapy Negative for chest pain and shortness of breath Fever: no Gastrointestinal: Negative for nausea and vomiting  Objective: Vital signs in last 24 hours: Temp:  [98.3 F (36.8 C)-99.6 F (37.6 C)] 99.6 F (37.6 C) (08/11 0442) Pulse Rate:  [85-99] 99 (08/11 0442) Resp:  [16-18] 18 (08/11 0442) BP: (104-121)/(57-78) 121/72 mmHg (08/11 0442) SpO2:  [90 %-92 %] 90 % (08/11 0442)  Intake/Output from previous day:  Intake/Output Summary (Last 24 hours) at 12/18/14 0543 Last data filed at 12/17/14 1432  Gross per 24 hour  Intake   1170 ml  Output    600 ml  Net    570 ml    Intake/Output this shift:    Labs:  Recent Labs  12/16/14 1256  HGB 13.3    Recent Labs  12/16/14 1256  WBC 16.6*  RBC 4.75  HCT 40.9  PLT 189    Recent Labs  12/16/14 1256 12/17/14 0854  NA  --  138  K  --  3.6  CL  --  103  CO2  --  28  BUN  --  <5*  CREATININE 0.65 0.52  GLUCOSE  --  117*  CALCIUM  --  8.4*   No results for input(s): LABPT, INR in the last 72 hours.   EXAM General - Patient is Alert and Oriented Extremity - Neurovascular intact Dorsiflexion/Plantar flexion intact No cellulitis present Dressing/Incision - clean, dry, no drainage, dressing change today. Motor Function - intact, moving foot and toes well on exam.   Past Medical History  Diagnosis Date  . High cholesterol   . GERD (gastroesophageal reflux disease)   . Anxiety   . Depression   . OCD (obsessive compulsive disorder)   . Chronic back pain   . Chronic constipation   . Severe obesity   . AR (allergic rhinitis)   . Trochanteric bursitis of right hip   . Tobacco use   .  Hepatomegaly   . Dyslipidemia   . COPD (chronic obstructive pulmonary disease)   . Decreased dorsalis pedis pulse   . Fatty liver   . Chronic insomnia   . Benign hypertension   . Deaf   . Hot flashes   . Migraine with aura     Assessment/Plan: 2 Days Post-Op Procedure(s) (LRB): TOTAL HIP ARTHROPLASTY ANTERIOR APPROACH (Left) Active Problems:   Primary osteoarthritis of left hip  Estimated body mass index is 35.41 kg/(m^2) as calculated from the following:   Height as of this encounter: 5\' 4"  (1.626 m).   Weight as of this encounter: 93.622 kg (206 lb 6.4 oz). Discharge home with home health after doing stairs and ambulation with physical therapy  DVT Prophylaxis - Lovenox, Foot Pumps and TED hose Weight-Bearing as tolerated to left leg  Reche Dixon, PA-C Orthopaedic Surgery 12/18/2014, 5:43 AM '

## 2014-12-18 NOTE — Care Management Important Message (Signed)
Important Message  Patient Details  Name: Wendy Johnson MRN: [id number] Date of Birth: [birth year]   Medicare Important Message Given:  Yes-second notification given    Juliann Pulse A Allmond 12/18/2014, 9:39 AM

## 2014-12-18 NOTE — Progress Notes (Signed)
Refuses foot pumps

## 2014-12-18 NOTE — Progress Notes (Signed)
Physical Therapy Treatment Patient Details Name: PAISLY FINGERHUT MRN: [id number] DOB: 1965-05-22 Today's Date: 12/18/2014    History of Present Illness Pt underwent L THA anterior approach and is POD#1 at time of evaluation. No reported post-op complications    PT Comments    Pt progressing well with all functional mobility. Pt demonstrates good safety.  Nursing notified of patient progress. Patient to be discharged to home today.   Follow Up Recommendations  Home health PT     Equipment Recommendations       Recommendations for Other Services       Precautions / Restrictions Precautions Precautions: Anterior Hip Restrictions Weight Bearing Restrictions: Yes LLE Weight Bearing: Weight bearing as tolerated    Mobility  Bed Mobility Overal bed mobility: Modified Independent Bed Mobility: Supine to Sit     Supine to sit: Modified independent (Device/Increase time)     General bed mobility comments: uses rails and edge of bed  Transfers Overall transfer level: Modified independent Equipment used: Rolling walker (2 wheeled) Transfers: Sit to/from Stand Sit to Stand: Modified independent (Device/Increase time)            Ambulation/Gait Ambulation/Gait assistance: Min guard Ambulation Distance (Feet): 240 Feet (also to/from bathroom) Assistive device: Rolling walker (2 wheeled) Gait Pattern/deviations: Step-through pattern;Decreased step length - right;Decreased weight shift to left;Antalgic (Mildly. Toe in bilateral; pt notes this is chronic)   Gait velocity interpretation: Below normal speed for age/gender     Stairs Stairs: Yes Stairs assistance: Min guard Stair Management: Two rails Number of Stairs: 4 General stair comments: Pt notes she has no steps at home or to enter  Wheelchair Mobility    Modified Rankin (Stroke Patients Only)       Balance             Standing balance-Leahy Scale: Fair                      Cognition  Arousal/Alertness: Awake/alert Behavior During Therapy: WFL for tasks assessed/performed Overall Cognitive Status: Within Functional Limits for tasks assessed       Memory:  (Able to verbalize anterior hip precaution)              Exercises      General Comments        Pertinent Vitals/Pain Pain Assessment: 0-10 Pain Score: 8  Pain Location: L hip/thigh Pain Descriptors / Indicators: Burning (with ambulation; no pain at rest) Pain Intervention(s): Monitored during session;Premedicated before session    Home Living                      Prior Function            PT Goals (current goals can now be found in the care plan section) Progress towards PT goals: Progressing toward goals    Frequency  BID    PT Plan Current plan remains appropriate    Co-evaluation             End of Session Equipment Utilized During Treatment: Gait belt Activity Tolerance: Patient tolerated treatment well Patient left: in bed;with call bell/phone within reach;with bed alarm set     Time: 1031-1056 PT Time Calculation (min) (ACUTE ONLY): 25 min  Charges:  $Gait Training: 23-37 mins                    G Codes:      Charlaine Dalton 12/18/2014, 11:20 AM

## 2014-12-20 DIAGNOSIS — Z96642 Presence of left artificial hip joint: Secondary | ICD-10-CM | POA: Diagnosis not present

## 2014-12-20 DIAGNOSIS — Z7901 Long term (current) use of anticoagulants: Secondary | ICD-10-CM | POA: Diagnosis not present

## 2014-12-20 DIAGNOSIS — Z471 Aftercare following joint replacement surgery: Secondary | ICD-10-CM | POA: Diagnosis not present

## 2014-12-20 DIAGNOSIS — J449 Chronic obstructive pulmonary disease, unspecified: Secondary | ICD-10-CM | POA: Diagnosis not present

## 2014-12-20 DIAGNOSIS — F419 Anxiety disorder, unspecified: Secondary | ICD-10-CM | POA: Diagnosis not present

## 2014-12-20 DIAGNOSIS — F319 Bipolar disorder, unspecified: Secondary | ICD-10-CM | POA: Diagnosis not present

## 2014-12-26 DIAGNOSIS — Z7901 Long term (current) use of anticoagulants: Secondary | ICD-10-CM | POA: Diagnosis not present

## 2014-12-26 DIAGNOSIS — F319 Bipolar disorder, unspecified: Secondary | ICD-10-CM | POA: Diagnosis not present

## 2014-12-26 DIAGNOSIS — J449 Chronic obstructive pulmonary disease, unspecified: Secondary | ICD-10-CM | POA: Diagnosis not present

## 2014-12-26 DIAGNOSIS — Z471 Aftercare following joint replacement surgery: Secondary | ICD-10-CM | POA: Diagnosis not present

## 2014-12-26 DIAGNOSIS — F419 Anxiety disorder, unspecified: Secondary | ICD-10-CM | POA: Diagnosis not present

## 2014-12-26 DIAGNOSIS — Z96642 Presence of left artificial hip joint: Secondary | ICD-10-CM | POA: Diagnosis not present

## 2015-01-01 DIAGNOSIS — Z96642 Presence of left artificial hip joint: Secondary | ICD-10-CM | POA: Diagnosis not present

## 2015-01-01 DIAGNOSIS — F410 Panic disorder [episodic paroxysmal anxiety] without agoraphobia: Secondary | ICD-10-CM | POA: Diagnosis not present

## 2015-01-01 DIAGNOSIS — F319 Bipolar disorder, unspecified: Secondary | ICD-10-CM | POA: Diagnosis not present

## 2015-01-01 DIAGNOSIS — F901 Attention-deficit hyperactivity disorder, predominantly hyperactive type: Secondary | ICD-10-CM | POA: Diagnosis not present

## 2015-01-01 DIAGNOSIS — F3189 Other bipolar disorder: Secondary | ICD-10-CM | POA: Diagnosis not present

## 2015-01-01 DIAGNOSIS — J449 Chronic obstructive pulmonary disease, unspecified: Secondary | ICD-10-CM | POA: Diagnosis not present

## 2015-01-01 DIAGNOSIS — Z79899 Other long term (current) drug therapy: Secondary | ICD-10-CM | POA: Diagnosis not present

## 2015-01-01 DIAGNOSIS — F4310 Post-traumatic stress disorder, unspecified: Secondary | ICD-10-CM | POA: Diagnosis not present

## 2015-01-01 DIAGNOSIS — F419 Anxiety disorder, unspecified: Secondary | ICD-10-CM | POA: Diagnosis not present

## 2015-01-01 DIAGNOSIS — Z471 Aftercare following joint replacement surgery: Secondary | ICD-10-CM | POA: Diagnosis not present

## 2015-01-01 DIAGNOSIS — F902 Attention-deficit hyperactivity disorder, combined type: Secondary | ICD-10-CM | POA: Diagnosis not present

## 2015-01-01 DIAGNOSIS — Z7901 Long term (current) use of anticoagulants: Secondary | ICD-10-CM | POA: Diagnosis not present

## 2015-01-02 DIAGNOSIS — F319 Bipolar disorder, unspecified: Secondary | ICD-10-CM | POA: Diagnosis not present

## 2015-01-02 DIAGNOSIS — J449 Chronic obstructive pulmonary disease, unspecified: Secondary | ICD-10-CM | POA: Diagnosis not present

## 2015-01-02 DIAGNOSIS — Z7901 Long term (current) use of anticoagulants: Secondary | ICD-10-CM | POA: Diagnosis not present

## 2015-01-02 DIAGNOSIS — Z96642 Presence of left artificial hip joint: Secondary | ICD-10-CM | POA: Diagnosis not present

## 2015-01-02 DIAGNOSIS — F419 Anxiety disorder, unspecified: Secondary | ICD-10-CM | POA: Diagnosis not present

## 2015-01-02 DIAGNOSIS — Z471 Aftercare following joint replacement surgery: Secondary | ICD-10-CM | POA: Diagnosis not present

## 2015-01-05 ENCOUNTER — Other Ambulatory Visit: Payer: Self-pay | Admitting: Family Medicine

## 2015-01-05 MED ORDER — IBUPROFEN 800 MG PO TABS: 800.0000 mg | ORAL_TABLET | Freq: Three times a day (TID) | ORAL | Status: DC | PRN

## 2015-01-05 NOTE — Telephone Encounter (Signed)
Pt would like a med refill on 800 mg ibuprofen 90 pills to be sent to [facility].

## 2015-01-08 DIAGNOSIS — F419 Anxiety disorder, unspecified: Secondary | ICD-10-CM | POA: Diagnosis not present

## 2015-01-08 DIAGNOSIS — J449 Chronic obstructive pulmonary disease, unspecified: Secondary | ICD-10-CM | POA: Diagnosis not present

## 2015-01-08 DIAGNOSIS — Z96642 Presence of left artificial hip joint: Secondary | ICD-10-CM | POA: Diagnosis not present

## 2015-01-08 DIAGNOSIS — Z471 Aftercare following joint replacement surgery: Secondary | ICD-10-CM | POA: Diagnosis not present

## 2015-01-08 DIAGNOSIS — Z7901 Long term (current) use of anticoagulants: Secondary | ICD-10-CM | POA: Diagnosis not present

## 2015-01-08 DIAGNOSIS — F319 Bipolar disorder, unspecified: Secondary | ICD-10-CM | POA: Diagnosis not present

## 2015-01-09 DIAGNOSIS — Z471 Aftercare following joint replacement surgery: Secondary | ICD-10-CM | POA: Diagnosis not present

## 2015-01-09 DIAGNOSIS — F319 Bipolar disorder, unspecified: Secondary | ICD-10-CM | POA: Diagnosis not present

## 2015-01-09 DIAGNOSIS — Z96642 Presence of left artificial hip joint: Secondary | ICD-10-CM | POA: Diagnosis not present

## 2015-01-09 DIAGNOSIS — F419 Anxiety disorder, unspecified: Secondary | ICD-10-CM | POA: Diagnosis not present

## 2015-01-09 DIAGNOSIS — Z7901 Long term (current) use of anticoagulants: Secondary | ICD-10-CM | POA: Diagnosis not present

## 2015-01-09 DIAGNOSIS — J449 Chronic obstructive pulmonary disease, unspecified: Secondary | ICD-10-CM | POA: Diagnosis not present

## 2015-01-12 DIAGNOSIS — J449 Chronic obstructive pulmonary disease, unspecified: Secondary | ICD-10-CM | POA: Diagnosis not present

## 2015-01-12 DIAGNOSIS — Z471 Aftercare following joint replacement surgery: Secondary | ICD-10-CM | POA: Diagnosis not present

## 2015-01-12 DIAGNOSIS — F419 Anxiety disorder, unspecified: Secondary | ICD-10-CM | POA: Diagnosis not present

## 2015-01-12 DIAGNOSIS — Z96642 Presence of left artificial hip joint: Secondary | ICD-10-CM | POA: Diagnosis not present

## 2015-01-12 DIAGNOSIS — Z7901 Long term (current) use of anticoagulants: Secondary | ICD-10-CM | POA: Diagnosis not present

## 2015-01-12 DIAGNOSIS — F319 Bipolar disorder, unspecified: Secondary | ICD-10-CM | POA: Diagnosis not present

## 2015-01-14 DIAGNOSIS — F319 Bipolar disorder, unspecified: Secondary | ICD-10-CM | POA: Diagnosis not present

## 2015-01-14 DIAGNOSIS — Z471 Aftercare following joint replacement surgery: Secondary | ICD-10-CM | POA: Diagnosis not present

## 2015-01-14 DIAGNOSIS — J449 Chronic obstructive pulmonary disease, unspecified: Secondary | ICD-10-CM | POA: Diagnosis not present

## 2015-01-14 DIAGNOSIS — Z7901 Long term (current) use of anticoagulants: Secondary | ICD-10-CM | POA: Diagnosis not present

## 2015-01-14 DIAGNOSIS — F419 Anxiety disorder, unspecified: Secondary | ICD-10-CM | POA: Diagnosis not present

## 2015-01-14 DIAGNOSIS — Z96642 Presence of left artificial hip joint: Secondary | ICD-10-CM | POA: Diagnosis not present

## 2015-01-19 DIAGNOSIS — Z471 Aftercare following joint replacement surgery: Secondary | ICD-10-CM | POA: Diagnosis not present

## 2015-01-19 DIAGNOSIS — F319 Bipolar disorder, unspecified: Secondary | ICD-10-CM | POA: Diagnosis not present

## 2015-01-19 DIAGNOSIS — F419 Anxiety disorder, unspecified: Secondary | ICD-10-CM | POA: Diagnosis not present

## 2015-01-19 DIAGNOSIS — Z7901 Long term (current) use of anticoagulants: Secondary | ICD-10-CM | POA: Diagnosis not present

## 2015-01-19 DIAGNOSIS — J449 Chronic obstructive pulmonary disease, unspecified: Secondary | ICD-10-CM | POA: Diagnosis not present

## 2015-01-19 DIAGNOSIS — Z96642 Presence of left artificial hip joint: Secondary | ICD-10-CM | POA: Diagnosis not present

## 2015-01-20 ENCOUNTER — Encounter: Payer: Self-pay | Admitting: Anesthesiology

## 2015-01-20 ENCOUNTER — Ambulatory Visit: Payer: Medicare Other | Attending: Anesthesiology | Admitting: Anesthesiology

## 2015-01-20 VITALS — HR 70 | Temp 98.5°F | Resp 18 | Ht 64.0 in | Wt 190.0 lb

## 2015-01-20 DIAGNOSIS — Z842 Family history of other diseases of the genitourinary system: Secondary | ICD-10-CM | POA: Diagnosis not present

## 2015-01-20 DIAGNOSIS — M545 Low back pain: Secondary | ICD-10-CM | POA: Diagnosis not present

## 2015-01-20 DIAGNOSIS — M5136 Other intervertebral disc degeneration, lumbar region: Secondary | ICD-10-CM | POA: Diagnosis not present

## 2015-01-20 DIAGNOSIS — G8918 Other acute postprocedural pain: Secondary | ICD-10-CM | POA: Diagnosis not present

## 2015-01-20 DIAGNOSIS — M51369 Other intervertebral disc degeneration, lumbar region without mention of lumbar back pain or lower extremity pain: Secondary | ICD-10-CM

## 2015-01-20 MED ORDER — OXYCODONE HCL 10 MG PO TABS: 10.0000 mg | ORAL_TABLET | Freq: Three times a day (TID) | ORAL | Status: DC

## 2015-01-20 NOTE — Progress Notes (Signed)
Safety precautions to be maintained throughout the outpatient stay will include: orient to surroundings, keep bed in low position, maintain call bell within reach at all times, provide assistance with transfer out of bed and ambulation.  

## 2015-01-22 NOTE — Progress Notes (Addendum)
Subjective:    Patient ID: Wendy Johnson, female    DOB: 10-28-1965, 49 y.o.   MRN: [id number]    chief complaint: Chronic low back pain. Chronic bilateral lower extremity pain and diffuse body pain   HPI:    Wendy Johnson is a pleasant 49 year old white female. She presents today for reevaluation. She has a long-standing history of diffuse body pain including mid thoracic pain, right lower extremity pain, severe left hip pain and left knee pain including groin pain. She has been taking Percocet 10 mg tablets with one half tablet twice a day and this generally keeps her pain under reasonable control. She denies any diverting or illicit use and states that she takes the medications as prescribed however has recently increased her dosing secondary to intensifying left hip pain. She has recently had a left hip procedure done by Dr. Rudene Christians.She is also status post a recent abdominal hysterectomy within the past several months that has caused postoperative pain for which she has been treating with the Percocet. She maintains that she has been using these tablets up to 3 times a day to help manage her postoperative pain and intensifying left hip pain. She has diffuse osteoarthritis affecting the hips and knees with a scheduled hip replacement here in the near future otherwise she says she is in her usual state of health this time without changes in bowel bladder function or lower extremity strength and function.  Pulse 70  Temp(Src) 98.5 F (36.9 C) (Oral)  Resp 18  Ht 5\' 4"  (1.626 m)  Wt 190 lb (86.183 kg)  BMI 32.60 kg/m2  SpO2 100%  LMP 09/26/2014    Current outpatient prescriptions:  .  albuterol (PROAIR HFA) 108 (90 BASE) MCG/ACT inhaler, Inhale 2 puffs into the lungs every 6 (six) hours as needed., Disp: , Rfl:  .  amphetamine-dextroamphetamine (ADDERALL) 30 MG tablet, Take 30 mg by mouth 2 (two) times daily., Disp: , Rfl:  .  ARIPiprazole (ABILIFY) 15 MG tablet, Take 15 mg by mouth daily., Disp: ,  Rfl:  .  baclofen (LIORESAL) 20 MG tablet, Take 1 tablet (20 mg total) by mouth every 6 (six) hours as needed for muscle spasms., Disp: 60 each, Rfl: 5 .  busPIRone (BUSPAR) 10 MG tablet, Take 20 mg by mouth 2 (two) times daily. , Disp: , Rfl:  .  clonazePAM (KLONOPIN) 1 MG tablet, Take 1-2 mg by mouth 3 (three) times daily as needed for anxiety., Disp: , Rfl:  .  dexlansoprazole (DEXILANT) 60 MG capsule, Take 1 capsule (60 mg total) by mouth daily., Disp: 30 capsule, Rfl: 5 .  enoxaparin (LOVENOX) 40 MG/0.4ML injection, Inject 0.4 mLs (40 mg total) into the skin daily., Disp: 14 Syringe, Rfl: 0 .  estradiol (ESTRACE) 1 MG tablet, Take 1 tablet (1 mg total) by mouth daily., Disp: 30 tablet, Rfl: 11 .  ibuprofen (ADVIL,MOTRIN) 800 MG tablet, Take 1 tablet (800 mg total) by mouth every 8 (eight) hours as needed for moderate pain., Disp: 90 tablet, Rfl: 0 .  Multiple Vitamin (MULTIVITAMIN WITH MINERALS) TABS tablet, Take 1 tablet by mouth daily., Disp: , Rfl:  .  Oxycodone HCl 10 MG TABS, Take 1 tablet (10 mg total) by mouth 3 (three) times daily before meals., Disp: 90 tablet, Rfl: 0 .  pravastatin (PRAVACHOL) 40 MG tablet, Take 1 tablet (40 mg total) by mouth daily., Disp: 30 tablet, Rfl: 5 .  sertraline (ZOLOFT) 50 MG tablet, Take 100 mg by mouth 2 (two)  times daily. , Disp: , Rfl:  .  tiotropium (SPIRIVA) 18 MCG inhalation capsule, Place 18 mcg into inhaler and inhale daily., Disp: , Rfl:  .  traZODone (DESYREL) 150 MG tablet, Take 300 mg by mouth at bedtime as needed for sleep. , Disp: , Rfl:    Patient Active Problem List   Diagnosis Date Noted  . Primary osteoarthritis of left hip 12/16/2014  . Metabolic syndrome 62/37/6283  . Migraine without aura and without status migrainosus, not intractable 11/26/2014  . GERD without esophagitis 11/26/2014  . COPD, moderate 11/26/2014  . Nocturnal oxygen desaturation 11/26/2014  . Supplemental oxygen dependent 11/26/2014  . Hearing loss 11/26/2014   . Chronic insomnia 11/26/2014  . History of hypertension 11/26/2014  . Dyslipidemia 11/26/2014  . History of morbid obesity 11/26/2014  . History of diabetes mellitus, type II 11/26/2014  . Chronic constipation 11/26/2014  . Generalized anxiety disorder 11/11/2014  . DDD (degenerative disc disease), lumbar 11/11/2014  . Facet arthritis of lumbar region 11/11/2014  . Primary osteoarthritis involving multiple joints 11/11/2014  . H/O hysterectomy for benign disease 10/20/2014  . History of endometriosis 10/16/2014  . Low back derangement syndrome 09/30/2014  . Degenerative joint disease of both lower legs 09/30/2014  . Bipolar disorder 09/30/2014     Review of Systems  Review of systems negative for cardiovascular negative for pulmonary she snores if her neurologic positive for anxiety depression panic attacks and insomnia she also has bipolar disease as CD with a history of reflux negative for GU negative for hematologic negative for endocrine negative for rheumatologic  Social history is positive for being divorced with one child she smokes pack cigarettes per day for doing this for 12 years  Currently disabled since 2006.  Family history is positive for alcoholism diabetes and high blood pressure     Objective:   Physical Exam  Patient is 49 year old white female in no acute distress is alert and oriented 3 and compliant with examination. Her heart is regular rate and rhythm and lungs are clear to auscultation with bilateral wheeze inspection of the low back reveals some paraspinous muscle tenderness but no overt trigger points. She also has midthoracic paraspinous muscle tenderness. He has pain on extension and bilateral rotation in standing position. Preserved throughout the lower extremities with good muscle tone and bulk. She does have a well-healed midline abdominal scar from a previous hysterectomy. No fasciculations are noted sensation appears to be grossly intact as have  some mild swelling about the right knee following surgery and this is followed by her orthopedic physicians. He has a well-healed scar on the right side.       Assessment & Plan:  #1. Chronic low back pain with low back syndrome and degenerative disc disease  #2. Diffuse body pain with osteoarthritis  #3. Degenerative joint disease with severe left hip pain  #4. History of bipolar disorder  #5. History of anxiety depression on chronic opioid management  Plan: #1. I have had a long discussion with patient regarding her care and I think it would be reasonable based on the chronicity and severity of her pain to have her on chronic opioid  management as the pain has been quite recalcitrant and she has been quite miserable with both her back pain and lower extremity pain. She will need continued follow-up with her orthopedic physicians as well as her primary care physicians. She has been on Percocet 10 mg tablets, one twice a day for this. The current regimen is  insufficient to treat her pain. I do not see evidence of diverting or illicit use. I will allow her to increase her dose to 1 tablet 3 times a day as a maximum dose as reviewed with her today. We will have her return to clinic in 2 months for reevaluation and follow her on a two-month regular basis for the near term. We have also talked about possibly reducing her Percocet to a twice a day dosing regimen in the next 2 months.  Dr. Vashti Hey

## 2015-01-27 ENCOUNTER — Other Ambulatory Visit: Payer: Self-pay | Admitting: Anesthesiology

## 2015-01-28 DIAGNOSIS — M25561 Pain in right knee: Secondary | ICD-10-CM | POA: Diagnosis not present

## 2015-01-28 DIAGNOSIS — T8484XD Pain due to internal orthopedic prosthetic devices, implants and grafts, subsequent encounter: Secondary | ICD-10-CM | POA: Diagnosis not present

## 2015-01-28 DIAGNOSIS — G8929 Other chronic pain: Secondary | ICD-10-CM | POA: Diagnosis not present

## 2015-01-28 DIAGNOSIS — M1611 Unilateral primary osteoarthritis, right hip: Secondary | ICD-10-CM | POA: Diagnosis not present

## 2015-01-28 DIAGNOSIS — M25562 Pain in left knee: Secondary | ICD-10-CM | POA: Diagnosis not present

## 2015-01-28 DIAGNOSIS — Z96642 Presence of left artificial hip joint: Secondary | ICD-10-CM | POA: Diagnosis not present

## 2015-02-12 ENCOUNTER — Ambulatory Visit (INDEPENDENT_AMBULATORY_CARE_PROVIDER_SITE_OTHER): Payer: Medicare Other | Admitting: Obstetrics and Gynecology

## 2015-02-12 ENCOUNTER — Encounter: Payer: Self-pay | Admitting: Obstetrics and Gynecology

## 2015-02-12 VITALS — BP 124/85 | HR 74 | Ht 64.0 in | Wt 202.4 lb

## 2015-02-12 DIAGNOSIS — N760 Acute vaginitis: Secondary | ICD-10-CM | POA: Diagnosis not present

## 2015-02-12 DIAGNOSIS — A499 Bacterial infection, unspecified: Secondary | ICD-10-CM | POA: Diagnosis not present

## 2015-02-12 DIAGNOSIS — R350 Frequency of micturition: Secondary | ICD-10-CM | POA: Diagnosis not present

## 2015-02-12 DIAGNOSIS — B9689 Other specified bacterial agents as the cause of diseases classified elsewhere: Secondary | ICD-10-CM

## 2015-02-12 LAB — POCT URINALYSIS DIPSTICK
Bilirubin, UA: NEGATIVE
Blood, UA: NEGATIVE
Glucose, UA: NEGATIVE
Ketones, UA: NEGATIVE
LEUKOCYTES UA: NEGATIVE
NITRITE UA: NEGATIVE
PROTEIN UA: NEGATIVE
SPEC GRAV UA: 1.025
Urobilinogen, UA: NEGATIVE
pH, UA: 6

## 2015-02-12 MED ORDER — METRONIDAZOLE 0.75 % VA GEL: 1.0000 | Freq: Every day | VAGINAL | Status: DC

## 2015-02-12 NOTE — Patient Instructions (Addendum)
1.  Use MetroGel daily 5 days 2.  Consider condom use during sex to prevent recurrence of odor. 3.   Follow-up as needed.

## 2015-02-12 NOTE — Progress Notes (Signed)
Patient ID: Wendy Johnson, female   DOB: 06-Jul-1965, 49 y.o.   MRN: [id number] Pt c/o "bad" vaginal odor. No discharge.  Chief complaint: 1.  Vaginal odor.  Patient reports development of vaginal odor following sex.  She does not have any signif vaginal itching,Burning, discharge, or pain.  Past medical history, past Surgical history, problem list, medications, and allergies are reviewed.  Review of systems: Per HPI.  OBJECTIVE: BP 124/85 mmHg  Pulse 74  Ht 5\' 4"  (1.626 m)  Wt 202 lb 6 oz (91.797 kg)  BMI 34.72 kg/m2  LMP 09/26/2014 Pleasant white female in no acute tress. Abdomen: Soft, nontender. Pelvic:  External genitalia-normal.  , BUS-normal.  Vagina-white secretions, minimal odor.  Cervix-surgically absent.  Uterus-surgically absent.  Bimanual-nontender   PROCEDURE: Wet prep.  KOH-no hyphae.  Normal saline-clue cells present; no trichomonas; no white blood cells.  IMPRESSION: 1.  Bacterial vaginosis.  PLAN: 1.  MetroGel daily 5 days; refill 1. 2.  Follow-up when necessary 3.  Consider condom use during sex for prevention of recurrence of BV  Brayton Mars, MD

## 2015-02-16 ENCOUNTER — Other Ambulatory Visit: Payer: Self-pay

## 2015-02-16 MED ORDER — IBUPROFEN 800 MG PO TABS: 800.0000 mg | ORAL_TABLET | Freq: Three times a day (TID) | ORAL | Status: DC | PRN

## 2015-02-16 NOTE — Telephone Encounter (Signed)
Patient requesting refill. 

## 2015-02-22 DIAGNOSIS — Z23 Encounter for immunization: Secondary | ICD-10-CM | POA: Diagnosis not present

## 2015-03-24 ENCOUNTER — Other Ambulatory Visit: Payer: Self-pay | Admitting: Anesthesiology

## 2015-03-24 ENCOUNTER — Ambulatory Visit: Payer: Medicare Other | Attending: Anesthesiology | Admitting: Anesthesiology

## 2015-03-24 ENCOUNTER — Encounter: Payer: Self-pay | Admitting: Anesthesiology

## 2015-03-24 DIAGNOSIS — M47816 Spondylosis without myelopathy or radiculopathy, lumbar region: Secondary | ICD-10-CM

## 2015-03-24 DIAGNOSIS — Z842 Family history of other diseases of the genitourinary system: Secondary | ICD-10-CM

## 2015-03-24 DIAGNOSIS — M545 Low back pain, unspecified: Secondary | ICD-10-CM

## 2015-03-24 DIAGNOSIS — E119 Type 2 diabetes mellitus without complications: Secondary | ICD-10-CM | POA: Diagnosis not present

## 2015-03-24 DIAGNOSIS — F1190 Opioid use, unspecified, uncomplicated: Secondary | ICD-10-CM

## 2015-03-24 DIAGNOSIS — J449 Chronic obstructive pulmonary disease, unspecified: Secondary | ICD-10-CM | POA: Diagnosis not present

## 2015-03-24 DIAGNOSIS — G545 Neuralgic amyotrophy: Secondary | ICD-10-CM | POA: Diagnosis not present

## 2015-03-24 DIAGNOSIS — K219 Gastro-esophageal reflux disease without esophagitis: Secondary | ICD-10-CM | POA: Insufficient documentation

## 2015-03-24 DIAGNOSIS — G8918 Other acute postprocedural pain: Secondary | ICD-10-CM

## 2015-03-24 DIAGNOSIS — Z96642 Presence of left artificial hip joint: Secondary | ICD-10-CM | POA: Diagnosis not present

## 2015-03-24 DIAGNOSIS — M5386 Other specified dorsopathies, lumbar region: Secondary | ICD-10-CM

## 2015-03-24 DIAGNOSIS — F319 Bipolar disorder, unspecified: Secondary | ICD-10-CM | POA: Insufficient documentation

## 2015-03-24 DIAGNOSIS — F418 Other specified anxiety disorders: Secondary | ICD-10-CM | POA: Insufficient documentation

## 2015-03-24 DIAGNOSIS — M150 Primary generalized (osteo)arthritis: Secondary | ICD-10-CM

## 2015-03-24 DIAGNOSIS — M8949 Other hypertrophic osteoarthropathy, multiple sites: Secondary | ICD-10-CM

## 2015-03-24 DIAGNOSIS — M25551 Pain in right hip: Secondary | ICD-10-CM | POA: Insufficient documentation

## 2015-03-24 DIAGNOSIS — G8929 Other chronic pain: Secondary | ICD-10-CM | POA: Diagnosis present

## 2015-03-24 DIAGNOSIS — M47812 Spondylosis without myelopathy or radiculopathy, cervical region: Secondary | ICD-10-CM

## 2015-03-24 DIAGNOSIS — M5136 Other intervertebral disc degeneration, lumbar region: Secondary | ICD-10-CM

## 2015-03-24 DIAGNOSIS — M4692 Unspecified inflammatory spondylopathy, cervical region: Secondary | ICD-10-CM | POA: Diagnosis not present

## 2015-03-24 DIAGNOSIS — M159 Polyosteoarthritis, unspecified: Secondary | ICD-10-CM

## 2015-03-24 DIAGNOSIS — I10 Essential (primary) hypertension: Secondary | ICD-10-CM | POA: Diagnosis not present

## 2015-03-24 DIAGNOSIS — M1612 Unilateral primary osteoarthritis, left hip: Secondary | ICD-10-CM | POA: Diagnosis not present

## 2015-03-24 DIAGNOSIS — M1990 Unspecified osteoarthritis, unspecified site: Secondary | ICD-10-CM | POA: Diagnosis not present

## 2015-03-24 MED ORDER — OXYCODONE HCL 10 MG PO TABS: 10.0000 mg | ORAL_TABLET | Freq: Three times a day (TID) | ORAL | Status: DC

## 2015-03-24 NOTE — Progress Notes (Signed)
Safety precautions to be maintained throughout the outpatient stay will include: orient to surroundings, keep bed in low position, maintain call bell within reach at all times, provide assistance with transfer out of bed and ambulation.  

## 2015-03-25 DIAGNOSIS — F4310 Post-traumatic stress disorder, unspecified: Secondary | ICD-10-CM | POA: Diagnosis not present

## 2015-03-25 DIAGNOSIS — F3189 Other bipolar disorder: Secondary | ICD-10-CM | POA: Diagnosis not present

## 2015-03-25 DIAGNOSIS — F410 Panic disorder [episodic paroxysmal anxiety] without agoraphobia: Secondary | ICD-10-CM | POA: Diagnosis not present

## 2015-03-25 DIAGNOSIS — F901 Attention-deficit hyperactivity disorder, predominantly hyperactive type: Secondary | ICD-10-CM | POA: Diagnosis not present

## 2015-03-25 DIAGNOSIS — F902 Attention-deficit hyperactivity disorder, combined type: Secondary | ICD-10-CM | POA: Diagnosis not present

## 2015-03-25 DIAGNOSIS — Z79899 Other long term (current) drug therapy: Secondary | ICD-10-CM | POA: Diagnosis not present

## 2015-03-25 NOTE — Progress Notes (Signed)
Subjective:    Patient ID: Wendy Johnson, female    DOB: Oct 03, 1965, 49 y.o.   MRN: [id number]    chief complaint: Chronic low back pain. Chronic bilateral lower extremity pain and diffuse body pain  HPI:    Wendy Johnson is a pleasant 49 year old white female. She presents today for reevaluation. She has a long-standing history of diffuse body pain including mid thoracic pain, right lower extremity pain, severe left hip pain and left knee pain including groin pain. She has been taking Percocet 10 mg tablets 3 times per day. She had a recent left hip replacement by Dr. Youlanda Mighty and is scheduled for a reevaluation and possible January right hip replacement as well. Pain in her low back right left hip and diffuse systemic pain that she experiences has been well controlled with the oxycodone 10 mg tablets 3 times a day. She denies any diverting or illicit use with medications. She has been compliant with the regimen and otherwise denies any change in quality characters and distribution of her pain. LMP 09/26/2014    Current outpatient prescriptions:  .  albuterol (PROAIR HFA) 108 (90 BASE) MCG/ACT inhaler, Inhale 2 puffs into the lungs every 6 (six) hours as needed., Disp: , Rfl:  .  amphetamine-dextroamphetamine (ADDERALL) 30 MG tablet, Take 30 mg by mouth 2 (two) times daily., Disp: , Rfl:  .  ARIPiprazole (ABILIFY) 15 MG tablet, Take 15 mg by mouth daily., Disp: , Rfl:  .  baclofen (LIORESAL) 20 MG tablet, Take 1 tablet (20 mg total) by mouth every 6 (six) hours as needed for muscle spasms., Disp: 60 each, Rfl: 5 .  busPIRone (BUSPAR) 10 MG tablet, Take 20 mg by mouth 2 (two) times daily. , Disp: , Rfl:  .  clonazePAM (KLONOPIN) 1 MG tablet, Take 1-2 mg by mouth 3 (three) times daily as needed for anxiety., Disp: , Rfl:  .  dexlansoprazole (DEXILANT) 60 MG capsule, Take 1 capsule (60 mg total) by mouth daily., Disp: 30 capsule, Rfl: 5 .  estradiol (ESTRACE) 1 MG tablet, Take 1 tablet (1 mg total) by  mouth daily., Disp: 30 tablet, Rfl: 11 .  ibuprofen (ADVIL,MOTRIN) 800 MG tablet, Take 1 tablet (800 mg total) by mouth every 8 (eight) hours as needed for moderate pain., Disp: 90 tablet, Rfl: 2 .  metroNIDAZOLE (METROGEL) 0.75 % vaginal gel, Place 1 Applicatorful vaginally at bedtime. Apply one applicatorful to vagina at bedtime for 5 days, Disp: 70 g, Rfl: 1 .  Multiple Vitamin (MULTIVITAMIN WITH MINERALS) TABS tablet, Take 1 tablet by mouth daily., Disp: , Rfl:  .  Oxycodone HCl 10 MG TABS, Take 1 tablet (10 mg total) by mouth 3 (three) times daily before meals., Disp: 90 tablet, Rfl: 0 .  sertraline (ZOLOFT) 50 MG tablet, Take 100 mg by mouth 2 (two) times daily. , Disp: , Rfl:  .  tiotropium (SPIRIVA) 18 MCG inhalation capsule, Place 18 mcg into inhaler and inhale daily., Disp: , Rfl:  .  traZODone (DESYREL) 150 MG tablet, Take 300 mg by mouth at bedtime as needed for sleep. , Disp: , Rfl:  .  enoxaparin (LOVENOX) 40 MG/0.4ML injection, Inject 0.4 mLs (40 mg total) into the skin daily. (Patient not taking: Reported on 03/24/2015), Disp: 14 Syringe, Rfl: 0 .  pravastatin (PRAVACHOL) 40 MG tablet, Take 1 tablet (40 mg total) by mouth daily. (Patient not taking: Reported on 03/24/2015), Disp: 30 tablet, Rfl: 5   Patient Active Problem List   Diagnosis Date  Noted  . Primary osteoarthritis of left hip 12/16/2014  . Metabolic syndrome [id number]  . Migraine without aura and without status migrainosus, not intractable 11/26/2014  . GERD without esophagitis 11/26/2014  . COPD, moderate ([place]) 11/26/2014  . Nocturnal oxygen desaturation 11/26/2014  . Supplemental oxygen dependent 11/26/2014  . Hearing loss 11/26/2014  . Chronic insomnia 11/26/2014  . History of hypertension 11/26/2014  . Dyslipidemia 11/26/2014  . History of morbid obesity 11/26/2014  . History of diabetes mellitus, type II 11/26/2014  . Chronic constipation 11/26/2014  . Generalized anxiety disorder 11/11/2014  . DDD  (degenerative disc disease), lumbar 11/11/2014  . Facet arthritis of lumbar region 11/11/2014  . Primary osteoarthritis involving multiple joints 11/11/2014  . H/O hysterectomy for benign disease 10/20/2014  . History of endometriosis 10/16/2014  . Low back derangement syndrome 09/30/2014  . Degenerative joint disease of both lower legs 09/30/2014  . Bipolar disorder ([place]) 09/30/2014     Review of Systems  Review of systems negative for cardiovascular negative for pulmonary she snores if her neurologic positive for anxiety depression panic attacks and insomnia she also has bipolar disease as CD with a history of reflux negative for GU negative for hematologic negative for endocrine negative for rheumatologic  Social history is positive for being divorced with one child she smokes pack cigarettes per day for doing this for 12 years  Currently disabled since 2006.  Family history is positive for alcoholism diabetes and high blood pressure     Objective:   Physical Exam  Patient is 49 year old white female in no acute distress is alert and oriented 3 and compliant with examination. Her heart is regular rate and rhythm and lungs are clear to auscultation with bilateral wheeze inspection of the low back reveals some paraspinous muscle tenderness but no overt trigger points.     Assessment & Plan:  #1. Chronic low back pain with low back syndrome and degenerative disc disease  #2. Diffuse body pain with osteoarthritis  #3. Degenerative joint disease with severe left hip pain and right hip pain  #4. History of bipolar disorder  #5. History of anxiety depression on chronic opioid management  Plan: I will continue with current medication regimen with refills given for today and next month. She is scheduled to see Dr. Youlanda Mighty near future for possible right total hip placement in January. We'll make no changes between now and then with return to clinic in 2 months. Dr. Vashti Hey

## 2015-03-26 ENCOUNTER — Other Ambulatory Visit: Payer: Self-pay

## 2015-03-29 MED ORDER — TIOTROPIUM BROMIDE MONOHYDRATE 18 MCG IN CAPS: 18.0000 ug | ORAL_CAPSULE | Freq: Every day | RESPIRATORY_TRACT | Status: DC

## 2015-04-01 DIAGNOSIS — M25561 Pain in right knee: Secondary | ICD-10-CM | POA: Diagnosis not present

## 2015-04-01 DIAGNOSIS — M25461 Effusion, right knee: Secondary | ICD-10-CM | POA: Diagnosis not present

## 2015-04-01 DIAGNOSIS — Z96651 Presence of right artificial knee joint: Secondary | ICD-10-CM | POA: Diagnosis not present

## 2015-04-01 DIAGNOSIS — M7050 Other bursitis of knee, unspecified knee: Secondary | ICD-10-CM | POA: Diagnosis not present

## 2015-04-01 LAB — TOXASSURE SELECT 13 (MW), URINE: PDF: 0

## 2015-04-06 ENCOUNTER — Other Ambulatory Visit: Payer: Self-pay | Admitting: Family Medicine

## 2015-04-06 MED ORDER — UMECLIDINIUM BROMIDE 62.5 MCG/INH IN AEPB
1.0000 | INHALATION_SPRAY | Freq: Every day | RESPIRATORY_TRACT | Status: DC
Start: 2015-04-06 — End: 2015-07-08

## 2015-04-07 ENCOUNTER — Ambulatory Visit (INDEPENDENT_AMBULATORY_CARE_PROVIDER_SITE_OTHER): Payer: Medicare Other | Admitting: Family Medicine

## 2015-04-07 ENCOUNTER — Encounter: Payer: Self-pay | Admitting: Family Medicine

## 2015-04-07 VITALS — BP 126/72 | HR 104 | Temp 98.6°F | Resp 16 | Ht 64.0 in | Wt 213.3 lb

## 2015-04-07 DIAGNOSIS — J449 Chronic obstructive pulmonary disease, unspecified: Secondary | ICD-10-CM

## 2015-04-07 DIAGNOSIS — G4730 Sleep apnea, unspecified: Secondary | ICD-10-CM | POA: Diagnosis not present

## 2015-04-07 DIAGNOSIS — G4734 Idiopathic sleep related nonobstructive alveolar hypoventilation: Secondary | ICD-10-CM

## 2015-04-07 DIAGNOSIS — M62838 Other muscle spasm: Secondary | ICD-10-CM | POA: Diagnosis not present

## 2015-04-07 MED ORDER — TIZANIDINE HCL 4 MG PO TABS: 4.0000 mg | ORAL_TABLET | Freq: Four times a day (QID) | ORAL | Status: DC | PRN

## 2015-04-07 NOTE — Progress Notes (Signed)
Name: Wendy Johnson   MRN: [id number]    DOB: 02/04/1966   Date:04/07/2015       Progress Note  Subjective  Chief Complaint  Chief Complaint  Patient presents with  . Medication Refill    Patient wants to change from the baclofen to the soma.    HPI  Muscle Spasms: she has a history of chronic back pain and muscle spasms, that happens daily and is painful. She has been taking Baclofen but no longer works. She used to take Soma in the past and it worked well for her. Explained to Wendy Johnson that Wendy Johnson is not covered by her insurance and she is willing to try Tizanidine instead.  Sleep Apnea: she used to have a CPAP machine at home, but states that when she required oxygen she stopped using the CPAP machine. She continues to snore, also feels tired during the day. She takes naps during the day. ESS today is 6, but with her history we will recheck her sleep study.   COPD with nocturnal desaturation: she denies daily cough, no wheezing, denies decrease in exercise tolerance. She is using Incruse Elipta daily and is controlling her symptoms. She uses nasal canula oxygen 2 liters at night.    Patient Active Problem List   Diagnosis Date Noted  . Sleep apnea 04/07/2015  . Primary osteoarthritis of left hip 12/16/2014  . Metabolic syndrome 123XX123  . Migraine without aura and without status migrainosus, not intractable 11/26/2014  . GERD without esophagitis 11/26/2014  . COPD, moderate ([place]) 11/26/2014  . Nocturnal oxygen desaturation 11/26/2014  . Supplemental oxygen dependent 11/26/2014  . Hearing loss 11/26/2014  . Chronic insomnia 11/26/2014  . History of hypertension 11/26/2014  . Dyslipidemia 11/26/2014  . History of morbid obesity 11/26/2014  . History of diabetes mellitus, type II 11/26/2014  . Chronic constipation 11/26/2014  . Generalized anxiety disorder 11/11/2014  . DDD (degenerative disc disease), lumbar 11/11/2014  . Facet arthritis of lumbar region 11/11/2014  .  Primary osteoarthritis involving multiple joints 11/11/2014  . H/O hysterectomy for benign disease 10/20/2014  . History of endometriosis 10/16/2014  . Low back derangement syndrome 09/30/2014  . Degenerative joint disease of both lower legs 09/30/2014  . Bipolar disorder ([place]) 09/30/2014    Past Surgical History  Procedure Laterality Date  . Lipoma excision    . Knee arthroscopo Right   . Tonsillectomy    . Tubal ligation    . Laparoscopy  09/22/2014    Procedure: LAPAROSCOPY OPERATIVE;  Surgeon: Brayton Mars, MD;  Location: [facility] ORS;  Service: Gynecology;;  excision and fulgeration of endomertriosis  . Foot surgery Bilateral   . Bone spurs removed Bilateral   . Abdominal hysterectomy N/A 10/20/2014    Procedure: Total abdominial hysterectomy, bilateral salpingo-oophorectomy;  Surgeon: Brayton Mars, MD;  Location: [facility] ORS;  Service: Gynecology;  Laterality: N/A;  . Bilateral salpingoophorectomy    . Total hip arthroplasty Left 12/16/2014    Procedure: TOTAL HIP ARTHROPLASTY ANTERIOR APPROACH;  Surgeon: Hessie Knows, MD;  Location: [facility] ORS;  Service: Orthopedics;  Laterality: Left;  . Joint replacement Right     knee  . Joint replacement Left     Family History  Problem Relation Age of Onset  . Diabetes Mother   . Heart disease Father   . Heart attack Father   . Diabetes Sister     Social History   Social History  . Marital Status: Divorced    Spouse Name: N/A  .  Number of Children: N/A  . Years of Education: N/A   Occupational History  . Not on file.   Social History Main Topics  . Smoking status: Current Every Day Smoker -- 0.50 packs/day for 35 years    Types: Cigarettes    Start date: 11/26/1979  . Smokeless tobacco: Never Used  . Alcohol Use: No  . Drug Use: No     Comment: quit crack cocaine 2004  . Sexual Activity:    Partners: Male    Patent examiner Protection: None   Other Topics Concern  . Not on file   Social History Narrative      Current outpatient prescriptions:  .  amphetamine-dextroamphetamine (ADDERALL) 30 MG tablet, Take 30 mg by mouth 2 (two) times daily., Disp: , Rfl:  .  ARIPiprazole (ABILIFY) 15 MG tablet, Take 15 mg by mouth daily., Disp: , Rfl:  .  busPIRone (BUSPAR) 10 MG tablet, Take 20 mg by mouth 2 (two) times daily. , Disp: , Rfl:  .  clonazePAM (KLONOPIN) 1 MG tablet, Take 1-2 mg by mouth 3 (three) times daily as needed for anxiety., Disp: , Rfl:  .  dexlansoprazole (DEXILANT) 60 MG capsule, Take 1 capsule (60 mg total) by mouth daily., Disp: 30 capsule, Rfl: 5 .  ibuprofen (ADVIL,MOTRIN) 800 MG tablet, Take 1 tablet (800 mg total) by mouth every 8 (eight) hours as needed for moderate pain., Disp: 90 tablet, Rfl: 2 .  Multiple Vitamin (MULTIVITAMIN WITH MINERALS) TABS tablet, Take 1 tablet by mouth daily., Disp: , Rfl:  .  Oxycodone HCl 10 MG TABS, Take 1 tablet (10 mg total) by mouth 3 (three) times daily before meals., Disp: 90 tablet, Rfl: 0 .  sertraline (ZOLOFT) 50 MG tablet, Take 100 mg by mouth 2 (two) times daily. , Disp: , Rfl:  .  SPIRIVA HANDIHALER 18 MCG inhalation capsule, , Disp: , Rfl:  .  tiZANidine (ZANAFLEX) 4 MG tablet, Take 1 tablet (4 mg total) by mouth every 6 (six) hours as needed for muscle spasms., Disp: 60 tablet, Rfl: 2 .  traZODone (DESYREL) 150 MG tablet, Take 300 mg by mouth at bedtime as needed for sleep. , Disp: , Rfl:  .  Umeclidinium Bromide (INCRUSE ELLIPTA) 62.5 MCG/INH AEPB, Inhale 1 puff into the lungs daily., Disp: 30 each, Rfl: 5  Allergies  Allergen Reactions  . Imitrex [Sumatriptan] Other (See Comments)    Chest pain     ROS  Constitutional: Negative for fever , positive weight change.  Respiratory: Negative for cough and shortness of breath.   Cardiovascular: Negative for chest pain or palpitations.  Gastrointestinal: Negative for abdominal pain, no bowel changes.  Musculoskeletal: Negative for gait problem or joint swelling.  Skin: Negative for  rash.  Neurological: Negative for dizziness or headache.  No other specific complaints in a complete review of systems (except as listed in HPI above). Objective  Filed Vitals:   04/07/15 0944  BP: 126/72  Pulse: 104  Temp: 98.6 F (37 C)  TempSrc: Oral  Resp: 16  Height: 5\' 4"  (1.626 m)  Weight: 213 lb 4.8 oz (96.752 kg)  SpO2: 97%    Body mass index is 36.59 kg/(m^2).  Physical Exam  Constitutional: Patient appears well-developed and well-nourished. Obese  No distress.  HEENT: head atraumatic, normocephalic, pupils equal and reactive to light,  neck supple, throat within normal limits Cardiovascular: Normal rate, regular rhythm and normal heart sounds.  No murmur heard. No BLE edema. Pulmonary/Chest: Effort normal and  breath sounds normal. No respiratory distress. Abdominal: Soft.  There is no tenderness. Psychiatric: Patient has a normal mood and affect. behavior is normal. Judgment and thought content normal.  Recent Results (from the past 2160 hour(s))  POCT urinalysis dipstick     Status: None   Collection Time: 02/12/15  7:49 AM  Result Value Ref Range   Color, UA yellow    Clarity, UA cloudy    Glucose, UA negative    Bilirubin, UA negative    Ketones, UA negative    Spec Grav, UA 1.025    Blood, UA negative    pH, UA 6.0    Protein, UA negative    Urobilinogen, UA negative    Nitrite, UA negative    Leukocytes, UA Negative Negative  ToxASSURE Select 13 (MW), Urine     Status: None   Collection Time: 03/24/15 12:00 AM  Result Value Ref Range   Report Summary FINAL     Comment: ==================================================================== TOXASSURE SELECT 13 (MW) ==================================================================== Specimen Alert Note:  Urinary creatinine is low; ability to detect some drugs may be compromised.  Interpret results with caution. ==================================================================== Test                              Result       Flag       Units Drug Present and Declared for Prescription Verification   Oxycodone                      1310         EXPECTED   ng/mg creat   Oxymorphone                    760          EXPECTED   ng/mg creat   Noroxycodone                   1690         EXPECTED   ng/mg creat    Sources of oxycodone include scheduled prescription medications.    Oxymorphone and noroxycodone are expected metabolites of    oxycodone. Oxymorphone is also available as a scheduled    prescription medication. Drug Absent but Declared for Prescription Verification   Clonazepam                      Not Detected UNEXPECTED ng/mg creat ==================================================================== Test                      Result    Flag   Units      Ref Range   Creatinine              10        L      mg/dL      >=20 ==================================================================== Declared Medications:  The flagging and interpretation on this report are based on the  following declared medications.  Unexpected results may arise from  inaccuracies in the declared medications.  **Note: The testing scope of this panel includes these medications:  Clonazepam  Oxycodone ==================================================================== For clinical consultation, please call [phone]. ====================================================================    PDF .      PHQ2/9: Depression screen [facility] 2/9 03/24/2015 12/03/2014 11/26/2014 09/29/2014  Decreased Interest 0 0 0 0  Down, Depressed, Hopeless 0 0 0 0  PHQ - 2 Score 0 0  0 0     Fall Risk: Fall Risk  04/07/2015 03/24/2015 01/20/2015 12/03/2014 11/26/2014  Falls in the past year? No No No No No  Number falls in past yr: - - - - -  Injury with Fall? - - - - -  Risk for fall due to : - - - - -  Follow up - - - - -     Functional Status Survey: Is the patient deaf or have difficulty hearing?: Yes (deaf in  right ear) Does the patient have difficulty seeing, even when wearing glasses/contacts?: No Does the patient have difficulty concentrating, remembering, or making decisions?: No Does the patient have difficulty walking or climbing stairs?: No Does the patient have difficulty dressing or bathing?: No Does the patient have difficulty doing errands alone such as visiting a doctor's office or shopping?: No    Assessment & Plan  1. Muscle spasm  We will try Tizanidine - tiZANidine (ZANAFLEX) 4 MG tablet; Take 1 tablet (4 mg total) by mouth every 6 (six) hours as needed for muscle spasms.  Dispense: 60 tablet; Refill: 2  2. Nocturnal oxygen desaturation  Still smoking, needs to quit, check sleep study Continue supplementation   3. Sleep apnea  - Nocturnal polysomnography (NPSG); Future Used to use CPAP years ago  4. COPD, moderate ([place])  Continue inhaler, needs to quit smoking

## 2015-04-08 ENCOUNTER — Encounter: Payer: Self-pay | Admitting: Family Medicine

## 2015-04-17 ENCOUNTER — Other Ambulatory Visit: Payer: Self-pay | Admitting: Anesthesiology

## 2015-04-21 ENCOUNTER — Telehealth: Payer: Self-pay | Admitting: Family Medicine

## 2015-04-21 NOTE — Telephone Encounter (Signed)
She needs follow up

## 2015-04-21 NOTE — Telephone Encounter (Signed)
MELISSA CALLED PT AND ADVISED

## 2015-04-21 NOTE — Telephone Encounter (Signed)
Pt states she would like to get a referral to a neurosurgeon for the back of her neck. She states she has a lot of neck pain and discomfort. Please advise if pt needs an appt.

## 2015-04-21 NOTE — Telephone Encounter (Signed)
Please schedule follow-up before referral can be made

## 2015-04-22 DIAGNOSIS — M1611 Unilateral primary osteoarthritis, right hip: Secondary | ICD-10-CM | POA: Diagnosis not present

## 2015-04-23 ENCOUNTER — Encounter: Payer: Self-pay | Admitting: Family Medicine

## 2015-04-23 ENCOUNTER — Ambulatory Visit
Admission: RE | Admit: 2015-04-23 | Discharge: 2015-04-23 | Disposition: A | Discharge status: Home / Self Care | Payer: Medicare Other | Source: Ambulatory Visit | Attending: Family Medicine | Admitting: Family Medicine

## 2015-04-23 ENCOUNTER — Ambulatory Visit (INDEPENDENT_AMBULATORY_CARE_PROVIDER_SITE_OTHER): Payer: Medicare Other | Admitting: Family Medicine

## 2015-04-23 VITALS — BP 118/66 | HR 90 | Temp 97.7°F | Resp 18 | Ht 64.0 in | Wt 218.7 lb

## 2015-04-23 DIAGNOSIS — M542 Cervicalgia: Secondary | ICD-10-CM | POA: Insufficient documentation

## 2015-04-23 DIAGNOSIS — R51 Headache: Secondary | ICD-10-CM

## 2015-04-23 DIAGNOSIS — M5032 Other cervical disc degeneration, mid-cervical region, unspecified level: Secondary | ICD-10-CM | POA: Diagnosis not present

## 2015-04-23 DIAGNOSIS — G43009 Migraine without aura, not intractable, without status migrainosus: Secondary | ICD-10-CM

## 2015-04-23 DIAGNOSIS — R519 Headache, unspecified: Secondary | ICD-10-CM

## 2015-04-23 NOTE — Progress Notes (Signed)
Name: Wendy Johnson   MRN: [id number]    DOB: [birth year]   Date:04/23/2015       Progress Note  Subjective  Chief Complaint  Chief Complaint  Patient presents with  . Referral    Neurology  . Neck Pain    Patient is radiating from base of neck to left side of head, sharp, shooting pain and give her a headache.     HPI  Neck pain : symptoms started about one year ago, but over the past couple of weeks the symptoms are much worse. Pain is constant now, sharp, starts on the base of her neck and radiates to nuchal area and sometimes shoots to the left temporal area that is sharp and sometimes tingling. She states it is different than her migraine pain. She has a sensation that she will fall/pass out, no spinning sensation. Some nausea, but no vomiting. No weakness , no slurred speech no visual problems.   Headaches: she states pain from migraines is usually frontal, happens about 4- 5 times week, described as sharp, it can last from 30 minutes to 3 hours, resolves with Ibuprofen prn . She denies association with photophobia and phonophobia.   Migraine: she states episodes of migraine are seldom now - diagnosed years ago, but no current symptoms. Used to be associated with photophobia, phonophobia and throbbind  Patient Active Problem List   Diagnosis Date Noted  . Sleep apnea 04/07/2015  . Primary osteoarthritis of left hip 12/16/2014  . Metabolic syndrome 123XX123  . Migraine without aura and without status migrainosus, not intractable 11/26/2014  . GERD without esophagitis 11/26/2014  . COPD, moderate ([place]) 11/26/2014  . Nocturnal oxygen desaturation 11/26/2014  . Supplemental oxygen dependent 11/26/2014  . Hearing loss 11/26/2014  . Chronic insomnia 11/26/2014  . History of hypertension 11/26/2014  . Dyslipidemia 11/26/2014  . History of morbid obesity 11/26/2014  . History of diabetes mellitus, type II 11/26/2014  . Chronic constipation 11/26/2014  . Generalized anxiety  disorder 11/11/2014  . DDD (degenerative disc disease), lumbar 11/11/2014  . Facet arthritis of lumbar region 11/11/2014  . Primary osteoarthritis involving multiple joints 11/11/2014  . H/O hysterectomy for benign disease 10/20/2014  . History of endometriosis 10/16/2014  . Low back derangement syndrome 09/30/2014  . Degenerative joint disease of both lower legs 09/30/2014  . Bipolar disorder ([place]) 09/30/2014    Past Surgical History  Procedure Laterality Date  . Lipoma excision    . Knee arthroscopo Right   . Tonsillectomy    . Tubal ligation    . Laparoscopy  09/22/2014    Procedure: LAPAROSCOPY OPERATIVE;  Surgeon: Brayton Mars, MD;  Location: [facility] ORS;  Service: Gynecology;;  excision and fulgeration of endomertriosis  . Foot surgery Bilateral   . Bone spurs removed Bilateral   . Abdominal hysterectomy N/A 10/20/2014    Procedure: Total abdominial hysterectomy, bilateral salpingo-oophorectomy;  Surgeon: Brayton Mars, MD;  Location: [facility] OR[facility];  Service: Gynecology;  Laterality: N/A;  . Bilateral salpingoophorectomy    . Total hip arthroplasty Left 12/16/2014    Procedure: TOTAL HIP ARTHROPLASTY ANTERIOR APPROACH;  Surgeon: Hessie Knows, MD;  Location: [facility] ORS;  Service: Orthopedics;  Laterality: Left;  . Joint replacement Right     knee  . Joint replacement Left     Family History  Problem Relation Age of Onset  . Diabetes Mother   . Heart disease Father   . Heart attack Father   . Diabetes Sister  Social History   Social History  . Marital Status: Divorced    Spouse Name: N/A  . Number of Children: N/A  . Years of Education: N/A   Occupational History  . Not on file.   Social History Main Topics  . Smoking status: Current Every Day Smoker -- 0.50 packs/day for 35 years    Types: Cigarettes    Start date: 11/26/1979  . Smokeless tobacco: Never Used  . Alcohol Use: No  . Drug Use: No     Comment: quit crack cocaine 2004  . Sexual Activity:     Partners: Male    Patent examiner Protection: None   Other Topics Concern  . Not on file   Social History Narrative     Current outpatient prescriptions:  .  amphetamine-dextroamphetamine (ADDERALL) 30 MG tablet, Take 30 mg by mouth 2 (two) times daily., Disp: , Rfl:  .  ARIPiprazole (ABILIFY) 15 MG tablet, Take 15 mg by mouth daily., Disp: , Rfl:  .  busPIRone (BUSPAR) 10 MG tablet, Take 20 mg by mouth 2 (two) times daily. , Disp: , Rfl:  .  clonazePAM (KLONOPIN) 1 MG tablet, Take 1-2 mg by mouth 3 (three) times daily as needed for anxiety., Disp: , Rfl:  .  dexlansoprazole (DEXILANT) 60 MG capsule, Take 1 capsule (60 mg total) by mouth daily., Disp: 30 capsule, Rfl: 5 .  ibuprofen (ADVIL,MOTRIN) 800 MG tablet, Take 1 tablet (800 mg total) by mouth every 8 (eight) hours as needed for moderate pain., Disp: 90 tablet, Rfl: 2 .  Multiple Vitamin (MULTIVITAMIN WITH MINERALS) TABS tablet, Take 1 tablet by mouth daily., Disp: , Rfl:  .  Oxycodone HCl 10 MG TABS, Take 1 tablet (10 mg total) by mouth 3 (three) times daily before meals., Disp: 90 tablet, Rfl: 0 .  sertraline (ZOLOFT) 50 MG tablet, Take 100 mg by mouth 2 (two) times daily. , Disp: , Rfl:  .  tiZANidine (ZANAFLEX) 4 MG tablet, Take 1 tablet (4 mg total) by mouth every 6 (six) hours as needed for muscle spasms., Disp: 60 tablet, Rfl: 2 .  traZODone (DESYREL) 150 MG tablet, Take 300 mg by mouth at bedtime as needed for sleep. , Disp: , Rfl:  .  Umeclidinium Bromide (INCRUSE ELLIPTA) 62.5 MCG/INH AEPB, Inhale 1 puff into the lungs daily., Disp: 30 each, Rfl: 5  Allergies  Allergen Reactions  . Imitrex [Sumatriptan] Other (See Comments)    Chest pain     ROS  Ten systems reviewed and is negative except as mentioned in HPI   Objective  Filed Vitals:   04/23/15 1013  BP: 118/66  Pulse: 90  Temp: 97.7 F (36.5 C)  TempSrc: Oral  Resp: 18  Height: 5\' 4"  (1.626 m)  Weight: 218 lb 11.2 oz (99.202 kg)  SpO2: 90%     Body mass index is 37.52 kg/(m^2).  Physical Exam  Constitutional: Patient appears well-developed and well-nourished. Obese No distress.  HEENT: head atraumatic, normocephalic, pupils equal and reactive to light, neck supple, tender to touch and with rom , throat within normal limits Cardiovascular: Normal rate, regular rhythm and normal heart sounds.  No murmur heard. No BLE edema. Pulmonary/Chest: Effort normal and breath sounds normal. No respiratory distress. Abdominal: Soft.  There is no tenderness. Psychiatric: Patient has a normal mood and affect. behavior is normal. Judgment and thought content normal. Neuro: normal sensation, normal cranial nerves, Romberg negative, normal patellar tendon reflex  Recent Results (from the past 2160 hour(s))  POCT urinalysis dipstick     Status: None   Collection Time: 02/12/15  7:49 AM  Result Value Ref Range   Color, UA yellow    Clarity, UA cloudy    Glucose, UA negative    Bilirubin, UA negative    Ketones, UA negative    Spec Grav, UA 1.025    Blood, UA negative    pH, UA 6.0    Protein, UA negative    Urobilinogen, UA negative    Nitrite, UA negative    Leukocytes, UA Negative Negative  ToxASSURE Select 13 (MW), Urine     Status: None   Collection Time: 03/24/15 12:00 AM  Result Value Ref Range   Report Summary FINAL     Comment: ==================================================================== TOXASSURE SELECT 13 (MW) ==================================================================== Specimen Alert Note:  Urinary creatinine is low; ability to detect some drugs may be compromised.  Interpret results with caution. ==================================================================== Test                             Result       Flag       Units Drug Present and Declared for Prescription Verification   Oxycodone                      1310         EXPECTED   ng/mg creat   Oxymorphone                    760           EXPECTED   ng/mg creat   Noroxycodone                   1690         EXPECTED   ng/mg creat    Sources of oxycodone include scheduled prescription medications.    Oxymorphone and noroxycodone are expected metabolites of    oxycodone. Oxymorphone is also available as a scheduled    prescription medication. Drug Absent but Declared for Prescription Verification   Clonazepam                      Not Detected UNEXPECTED ng/mg creat ==================================================================== Test                      Result    Flag   Units      Ref Range   Creatinine              10        L      mg/dL      >=20 ==================================================================== Declared Medications:  The flagging and interpretation on this report are based on the  following declared medications.  Unexpected results may arise from  inaccuracies in the declared medications.  **Note: The testing scope of this panel includes these medications:  Clonazepam  Oxycodone ==================================================================== For clinical consultation, please call [phone]. ====================================================================    PDF .      PHQ2/9: Depression screen [facility] 2/9 03/24/2015 12/03/2014 11/26/2014 09/29/2014  Decreased Interest 0 0 0 0  Down, Depressed, Hopeless 0 0 0 0  PHQ - 2 Score 0 0 0 0     Fall Risk: Fall Risk  04/07/2015 03/24/2015 01/20/2015 12/03/2014 11/26/2014  Falls in the past year? No No No No No  Number falls in past yr: - - - - -  Injury with Fall? - - - - -  Risk for fall due to : - - - - -  Follow up - - - - -    Assessment & Plan  1. Migraine without aura and without status migrainosus, not intractable  - Ambulatory referral to Neurology  2. Neck pain  - DG Cervical Spine 2-[facility]; Future - Ambulatory referral to Neurology  3. Headache disorder  Possible rebound headaches, versus pain from neck pain.  Explained importance of avoiding nsaid's and pain medication all the time, but she states she can't wean self off.

## 2015-04-24 ENCOUNTER — Other Ambulatory Visit: Payer: Self-pay | Admitting: Family Medicine

## 2015-04-24 DIAGNOSIS — M5030 Other cervical disc degeneration, unspecified cervical region: Secondary | ICD-10-CM

## 2015-04-24 NOTE — Progress Notes (Signed)
Sent referral to pain clinic DDD cervical spine

## 2015-04-28 ENCOUNTER — Other Ambulatory Visit: Payer: Medicare Other

## 2015-05-05 ENCOUNTER — Encounter
Admission: RE | Admit: 2015-05-05 | Discharge: 2015-05-05 | Disposition: A | Discharge status: Home / Self Care | Payer: Medicare Other | Source: Ambulatory Visit | Attending: Orthopedic Surgery | Admitting: Orthopedic Surgery

## 2015-05-05 DIAGNOSIS — M1611 Unilateral primary osteoarthritis, right hip: Secondary | ICD-10-CM | POA: Diagnosis not present

## 2015-05-05 DIAGNOSIS — Z01812 Encounter for preprocedural laboratory examination: Secondary | ICD-10-CM | POA: Insufficient documentation

## 2015-05-05 HISTORY — DX: Unspecified osteoarthritis, unspecified site: M19.90

## 2015-05-05 LAB — TYPE AND SCREEN
ABO/RH(D): A POS
ANTIBODY SCREEN: NEGATIVE

## 2015-05-05 LAB — URINALYSIS COMPLETE WITH MICROSCOPIC (ARMC ONLY)
BILIRUBIN URINE: NEGATIVE
Bacteria, UA: NONE SEEN
Glucose, UA: NEGATIVE mg/dL
Hgb urine dipstick: NEGATIVE
KETONES UR: NEGATIVE mg/dL
LEUKOCYTES UA: NEGATIVE
Nitrite: NEGATIVE
PROTEIN: NEGATIVE mg/dL
SPECIFIC GRAVITY, URINE: 1.002 — AB (ref 1.005–1.030)
pH: 5 (ref 5.0–8.0)

## 2015-05-05 LAB — CBC
HCT: 39.8 % (ref 35.0–47.0)
Hemoglobin: 13.2 g/dL (ref 12.0–16.0)
MCH: 27.8 pg (ref 26.0–34.0)
MCHC: 33.3 g/dL (ref 32.0–36.0)
MCV: 83.6 fL (ref 80.0–100.0)
Platelets: 190 10*3/uL (ref 150–440)
RBC: 4.76 MIL/uL (ref 3.80–5.20)
RDW: 16.5 % — ABNORMAL HIGH (ref 11.5–14.5)
WBC: 5.6 10*3/uL (ref 3.6–11.0)

## 2015-05-05 LAB — PROTIME-INR
INR: 0.93
PROTHROMBIN TIME: 12.7 s (ref 11.4–15.0)

## 2015-05-05 LAB — BASIC METABOLIC PANEL
Anion gap: 8 (ref 5–15)
BUN: 13 mg/dL (ref 6–20)
CHLORIDE: 106 mmol/L (ref 101–111)
CO2: 26 mmol/L (ref 22–32)
CREATININE: 0.48 mg/dL (ref 0.44–1.00)
Calcium: 9.1 mg/dL (ref 8.9–10.3)
Glucose, Bld: 155 mg/dL — ABNORMAL HIGH (ref 65–99)
POTASSIUM: 3.3 mmol/L — AB (ref 3.5–5.1)
SODIUM: 140 mmol/L (ref 135–145)

## 2015-05-05 LAB — SURGICAL PCR SCREEN
MRSA, PCR: NEGATIVE
Staphylococcus aureus: NEGATIVE

## 2015-05-05 LAB — APTT: APTT: 27 s (ref 24–36)

## 2015-05-05 LAB — SEDIMENTATION RATE: Sed Rate: 14 mm/hr (ref 0–20)

## 2015-05-05 NOTE — Patient Instructions (Signed)
  Your procedure is scheduled on: 05/12/15 Tues Report to Day Surgery.2nd floor medical mall To find out your arrival time please call [phone] between 1PM - 3PM on 04/28/15.  Remember: Instructions that are not followed completely may result in serious medical risk, up to and including death, or upon the discretion of your surgeon and anesthesiologist your surgery may need to be rescheduled.    _x___ 1. Do not eat food or drink liquids after midnight. No gum chewing or hard candies.     ____ 2. No Alcohol for 24 hours before or after surgery.   ____ 3. Bring all medications with you on the day of surgery if instructed.    _x___ 4. Notify your doctor if there is any change in your medical condition     (cold, fever, infections).     Do not wear jewelry, make-up, hairpins, clips or nail polish.  Do not wear lotions, powders, or perfumes. You may wear deodorant.  Do not shave 48 hours prior to surgery. Men may shave face and neck.  Do not bring valuables to the hospital.    [facility] is not responsible for any belongings or valuables.               Contacts, dentures or bridgework may not be worn into surgery.  Leave your suitcase in the car. After surgery it may be brought to your room.  For patients admitted to the hospital, discharge time is determined by your                treatment team.   Patients discharged the day of surgery will not be allowed to drive home.   Please read over the following fact sheets that you were given:   MRSA Information   _x___ Take these medicines the morning of surgery with A SIP OF WATER:    1. ARIPiprazole (ABILIFY) 15 MG tablet  2. busPIRone (BUSPAR) 10 MG tablet  3. clonazePAM (KLONOPIN) 1 MG tablet  4.dexlansoprazole (DEXILANT) 60 MG capsule  5.sertraline (ZOLOFT) 50 MG tablet  6.tiotropium (SPIRIVA) 18 MCG inhalation capsule  ____ Fleet Enema (as directed)   _x___ Use CHG Soap as directed  _x__ Use inhalers on the day of  surgery  ____ Stop metformin 2 days prior to surgery    ____ Take 1/2 of usual insulin dose the night before surgery and none on the morning of surgery.   ____ Stop Coumadin/Plavix/aspirin on   _x___ Stop Anti-inflammatories 1 week before surgery may take Tylenol   ____ Stop supplements until after surgery.    ____ Bring C-Pap to the hospital.

## 2015-05-05 NOTE — Pre-Procedure Instructions (Signed)
Potassium 3.3 faxed results and request for potassium supplement to Dr Theodore Demark office.

## 2015-05-06 LAB — URINE CULTURE

## 2015-05-12 ENCOUNTER — Inpatient Hospital Stay: Payer: Medicare Other

## 2015-05-12 ENCOUNTER — Inpatient Hospital Stay: Payer: Medicare Other | Admitting: Certified Registered"

## 2015-05-12 ENCOUNTER — Encounter: Payer: Self-pay | Admitting: *Deleted

## 2015-05-12 ENCOUNTER — Encounter
Admission: RE | Disposition: A | Discharge status: Home / Self Care | Payer: Self-pay | Source: Ambulatory Visit | Attending: Orthopedic Surgery

## 2015-05-12 ENCOUNTER — Other Ambulatory Visit: Payer: Self-pay

## 2015-05-12 ENCOUNTER — Inpatient Hospital Stay
Admission: RE | Admit: 2015-05-12 | Discharge: 2015-05-14 | DRG: 470 | Disposition: A | Discharge status: Home / Self Care | Payer: Medicare Other | Source: Ambulatory Visit | Attending: Orthopedic Surgery | Admitting: Orthopedic Surgery

## 2015-05-12 DIAGNOSIS — F429 Obsessive-compulsive disorder, unspecified: Secondary | ICD-10-CM | POA: Diagnosis present

## 2015-05-12 DIAGNOSIS — F319 Bipolar disorder, unspecified: Secondary | ICD-10-CM | POA: Diagnosis present

## 2015-05-12 DIAGNOSIS — E7800 Pure hypercholesterolemia, unspecified: Secondary | ICD-10-CM | POA: Diagnosis present

## 2015-05-12 DIAGNOSIS — I10 Essential (primary) hypertension: Secondary | ICD-10-CM | POA: Diagnosis present

## 2015-05-12 DIAGNOSIS — Z419 Encounter for procedure for purposes other than remedying health state, unspecified: Secondary | ICD-10-CM

## 2015-05-12 DIAGNOSIS — J9811 Atelectasis: Secondary | ICD-10-CM | POA: Diagnosis not present

## 2015-05-12 DIAGNOSIS — E785 Hyperlipidemia, unspecified: Secondary | ICD-10-CM | POA: Diagnosis present

## 2015-05-12 DIAGNOSIS — H9190 Unspecified hearing loss, unspecified ear: Secondary | ICD-10-CM | POA: Diagnosis present

## 2015-05-12 DIAGNOSIS — J449 Chronic obstructive pulmonary disease, unspecified: Secondary | ICD-10-CM | POA: Diagnosis present

## 2015-05-12 DIAGNOSIS — D62 Acute posthemorrhagic anemia: Secondary | ICD-10-CM | POA: Diagnosis not present

## 2015-05-12 DIAGNOSIS — Z90710 Acquired absence of both cervix and uterus: Secondary | ICD-10-CM | POA: Diagnosis not present

## 2015-05-12 DIAGNOSIS — Z96641 Presence of right artificial hip joint: Secondary | ICD-10-CM | POA: Diagnosis not present

## 2015-05-12 DIAGNOSIS — R0902 Hypoxemia: Secondary | ICD-10-CM

## 2015-05-12 DIAGNOSIS — Z720 Tobacco use: Secondary | ICD-10-CM

## 2015-05-12 DIAGNOSIS — G8918 Other acute postprocedural pain: Secondary | ICD-10-CM

## 2015-05-12 DIAGNOSIS — M1611 Unilateral primary osteoarthritis, right hip: Secondary | ICD-10-CM | POA: Diagnosis present

## 2015-05-12 DIAGNOSIS — Z471 Aftercare following joint replacement surgery: Secondary | ICD-10-CM | POA: Diagnosis not present

## 2015-05-12 DIAGNOSIS — Z6836 Body mass index (BMI) 36.0-36.9, adult: Secondary | ICD-10-CM

## 2015-05-12 DIAGNOSIS — F418 Other specified anxiety disorders: Secondary | ICD-10-CM | POA: Diagnosis not present

## 2015-05-12 DIAGNOSIS — K760 Fatty (change of) liver, not elsewhere classified: Secondary | ICD-10-CM | POA: Diagnosis present

## 2015-05-12 DIAGNOSIS — K219 Gastro-esophageal reflux disease without esophagitis: Secondary | ICD-10-CM | POA: Diagnosis present

## 2015-05-12 DIAGNOSIS — M169 Osteoarthritis of hip, unspecified: Secondary | ICD-10-CM | POA: Diagnosis not present

## 2015-05-12 HISTORY — PX: TOTAL HIP ARTHROPLASTY: SHX124

## 2015-05-12 LAB — CBC
HEMATOCRIT: 36.7 % (ref 35.0–47.0)
Hemoglobin: 12 g/dL (ref 12.0–16.0)
MCH: 27.4 pg (ref 26.0–34.0)
MCHC: 32.6 g/dL (ref 32.0–36.0)
MCV: 84.2 fL (ref 80.0–100.0)
PLATELETS: 206 10*3/uL (ref 150–440)
RBC: 4.36 MIL/uL (ref 3.80–5.20)
RDW: 16.3 % — AB (ref 11.5–14.5)
WBC: 17.2 10*3/uL — AB (ref 3.6–11.0)

## 2015-05-12 LAB — CREATININE, SERUM
Creatinine, Ser: 0.56 mg/dL (ref 0.44–1.00)
GFR calc non Af Amer: >60 mL/min (ref >60)

## 2015-05-12 LAB — POTASSIUM: POTASSIUM: 3.7 mmol/L (ref 3.5–5.1)

## 2015-05-12 SURGERY — ARTHROPLASTY, HIP, TOTAL, ANTERIOR APPROACH
Anesthesia: General | Laterality: Right

## 2015-05-12 MED ORDER — ARIPIPRAZOLE 5 MG PO TABS
15.0000 mg | ORAL_TABLET | Freq: Every day | ORAL | Status: DC
  Administered 2015-05-13: 15 mg via ORAL
  Filled 2015-05-12 (×3): qty 3

## 2015-05-12 MED ORDER — ONDANSETRON HCL 4 MG PO TABS: 4.0000 mg | ORAL_TABLET | Freq: Four times a day (QID) | ORAL | Status: DC | PRN

## 2015-05-12 MED ORDER — PROPOFOL 10 MG/ML IV BOLUS
INTRAVENOUS | Status: DC | PRN
Start: 2015-05-12 — End: 2015-05-12
  Administered 2015-05-12: 200 mg via INTRAVENOUS

## 2015-05-12 MED ORDER — GLYCOPYRROLATE 0.2 MG/ML IJ SOLN
INTRAMUSCULAR | Status: DC | PRN
  Administered 2015-05-12: 0.2 mg via INTRAVENOUS

## 2015-05-12 MED ORDER — TRANEXAMIC ACID 1000 MG/10ML IV SOLN
1000.0000 mg | INTRAVENOUS | Status: AC
  Administered 2015-05-12: 1000 mg via INTRAVENOUS
  Filled 2015-05-12: qty 10

## 2015-05-12 MED ORDER — OXYCODONE HCL 5 MG PO TABS
10.0000 mg | ORAL_TABLET | Freq: Three times a day (TID) | ORAL | Status: DC
  Administered 2015-05-12 – 2015-05-13 (×4): 10 mg via ORAL
  Filled 2015-05-12 (×5): qty 2

## 2015-05-12 MED ORDER — HYDROMORPHONE HCL 1 MG/ML IJ SOLN
0.2500 mg | INTRAMUSCULAR | Status: DC | PRN
Start: 2015-05-12 — End: 2015-05-12
  Administered 2015-05-12 (×4): 0.25 mg via INTRAVENOUS

## 2015-05-12 MED ORDER — TRANEXAMIC ACID 1000 MG/10ML IV SOLN
INTRAVENOUS | Status: AC
  Filled 2015-05-12: qty 10

## 2015-05-12 MED ORDER — TRANEXAMIC ACID 1000 MG/10ML IV SOLN
1000.0000 mg | INTRAVENOUS | Status: DC | PRN
  Administered 2015-05-12: 1000 mg via TOPICAL

## 2015-05-12 MED ORDER — SUGAMMADEX SODIUM 200 MG/2ML IV SOLN
INTRAVENOUS | Status: DC | PRN
  Administered 2015-05-12: 200 mg via INTRAVENOUS

## 2015-05-12 MED ORDER — ZOLPIDEM TARTRATE 5 MG PO TABS: 5.0000 mg | ORAL_TABLET | Freq: Every evening | ORAL | Status: DC | PRN

## 2015-05-12 MED ORDER — ACETAMINOPHEN 325 MG PO TABS: 650.0000 mg | ORAL_TABLET | Freq: Four times a day (QID) | ORAL | Status: DC | PRN

## 2015-05-12 MED ORDER — DOCUSATE SODIUM 100 MG PO CAPS
100.0000 mg | ORAL_CAPSULE | Freq: Two times a day (BID) | ORAL | Status: DC
Start: 2015-05-12 — End: 2015-05-14
  Administered 2015-05-12 – 2015-05-14 (×5): 100 mg via ORAL
  Filled 2015-05-12 (×5): qty 1

## 2015-05-12 MED ORDER — ONDANSETRON HCL 4 MG/2ML IJ SOLN: 4.0000 mg | Freq: Four times a day (QID) | INTRAMUSCULAR | Status: DC | PRN

## 2015-05-12 MED ORDER — FENTANYL CITRATE (PF) 100 MCG/2ML IJ SOLN
INTRAMUSCULAR | Status: AC
  Administered 2015-05-12: 25 ug via INTRAVENOUS
  Filled 2015-05-12: qty 2

## 2015-05-12 MED ORDER — BUPIVACAINE-EPINEPHRINE 0.25% -1:200000 IJ SOLN
INTRAMUSCULAR | Status: DC | PRN
  Administered 2015-05-12: 30 mL

## 2015-05-12 MED ORDER — ACETAMINOPHEN 650 MG RE SUPP: 650.0000 mg | Freq: Four times a day (QID) | RECTAL | Status: DC | PRN

## 2015-05-12 MED ORDER — LIDOCAINE HCL (CARDIAC) 20 MG/ML IV SOLN
INTRAVENOUS | Status: DC | PRN
  Administered 2015-05-12: 50 mg via INTRAVENOUS

## 2015-05-12 MED ORDER — MORPHINE SULFATE (PF) 2 MG/ML IV SOLN
INTRAVENOUS | Status: AC
  Filled 2015-05-12: qty 1

## 2015-05-12 MED ORDER — NEOMYCIN-POLYMYXIN B GU 40-200000 IR SOLN
Status: AC
  Filled 2015-05-12: qty 4

## 2015-05-12 MED ORDER — FENTANYL CITRATE (PF) 100 MCG/2ML IJ SOLN
25.0000 ug | INTRAMUSCULAR | Status: AC | PRN
  Administered 2015-05-12 (×6): 25 ug via INTRAVENOUS

## 2015-05-12 MED ORDER — BUPIVACAINE-EPINEPHRINE (PF) 0.25% -1:200000 IJ SOLN
INTRAMUSCULAR | Status: AC
  Filled 2015-05-12: qty 30

## 2015-05-12 MED ORDER — MENTHOL 3 MG MT LOZG
1.0000 | LOZENGE | OROMUCOSAL | Status: DC | PRN
Start: 2015-05-12 — End: 2015-05-14

## 2015-05-12 MED ORDER — UMECLIDINIUM BROMIDE 62.5 MCG/INH IN AEPB: 1.0000 | INHALATION_SPRAY | Freq: Every day | RESPIRATORY_TRACT | Status: DC

## 2015-05-12 MED ORDER — NEOMYCIN-POLYMYXIN B GU 40-200000 IR SOLN
Status: AC
  Filled 2015-05-12: qty 1

## 2015-05-12 MED ORDER — CLONAZEPAM 1 MG PO TABS
1.0000 mg | ORAL_TABLET | Freq: Three times a day (TID) | ORAL | Status: DC | PRN
  Administered 2015-05-12 – 2015-05-13 (×4): 1 mg via ORAL
  Filled 2015-05-12 (×4): qty 1

## 2015-05-12 MED ORDER — AMPHETAMINE-DEXTROAMPHETAMINE 5 MG PO TABS
30.0000 mg | ORAL_TABLET | Freq: Two times a day (BID) | ORAL | Status: DC
  Administered 2015-05-13 – 2015-05-14 (×2): 30 mg via ORAL
  Filled 2015-05-12: qty 6
  Filled 2015-05-12: qty 1
  Filled 2015-05-12: qty 6

## 2015-05-12 MED ORDER — CEFAZOLIN SODIUM-DEXTROSE 2-3 GM-% IV SOLR
2.0000 g | Freq: Once | INTRAVENOUS | Status: AC
  Administered 2015-05-12: 2 g via INTRAVENOUS

## 2015-05-12 MED ORDER — TIZANIDINE HCL 4 MG PO TABS
4.0000 mg | ORAL_TABLET | Freq: Four times a day (QID) | ORAL | Status: DC | PRN
  Administered 2015-05-12 – 2015-05-13 (×2): 4 mg via ORAL
  Filled 2015-05-12 (×2): qty 1

## 2015-05-12 MED ORDER — ONDANSETRON HCL 4 MG/2ML IJ SOLN
INTRAMUSCULAR | Status: DC | PRN
  Administered 2015-05-12: 4 mg via INTRAVENOUS

## 2015-05-12 MED ORDER — NEOMYCIN-POLYMYXIN B GU 40-200000 IR SOLN
Status: AC
  Filled 2015-05-12: qty 2

## 2015-05-12 MED ORDER — SUCCINYLCHOLINE CHLORIDE 20 MG/ML IJ SOLN
INTRAMUSCULAR | Status: DC | PRN
  Administered 2015-05-12: 100 mg via INTRAVENOUS

## 2015-05-12 MED ORDER — SERTRALINE HCL 100 MG PO TABS
100.0000 mg | ORAL_TABLET | Freq: Two times a day (BID) | ORAL | Status: DC
  Administered 2015-05-12 – 2015-05-14 (×4): 100 mg via ORAL
  Filled 2015-05-12 (×4): qty 1

## 2015-05-12 MED ORDER — BUSPIRONE HCL 5 MG PO TABS
20.0000 mg | ORAL_TABLET | Freq: Two times a day (BID) | ORAL | Status: DC
  Administered 2015-05-12 – 2015-05-13 (×3): 20 mg via ORAL
  Filled 2015-05-12 (×5): qty 4

## 2015-05-12 MED ORDER — ONDANSETRON HCL 4 MG/2ML IJ SOLN: 4.0000 mg | Freq: Once | INTRAMUSCULAR | Status: DC | PRN

## 2015-05-12 MED ORDER — TRAZODONE HCL 100 MG PO TABS
300.0000 mg | ORAL_TABLET | Freq: Every evening | ORAL | Status: DC | PRN
  Administered 2015-05-12 – 2015-05-13 (×2): 300 mg via ORAL
  Filled 2015-05-12 (×2): qty 3

## 2015-05-12 MED ORDER — METOCLOPRAMIDE HCL 5 MG PO TABS: 5.0000 mg | ORAL_TABLET | Freq: Three times a day (TID) | ORAL | Status: DC | PRN

## 2015-05-12 MED ORDER — SODIUM CHLORIDE 0.9 % IV SOLN
INTRAVENOUS | Status: DC
  Administered 2015-05-12: 17:00:00 via INTRAVENOUS

## 2015-05-12 MED ORDER — PHENOL 1.4 % MT LIQD: 1.0000 | OROMUCOSAL | Status: DC | PRN

## 2015-05-12 MED ORDER — MIDAZOLAM HCL 2 MG/2ML IJ SOLN
INTRAMUSCULAR | Status: DC | PRN
  Administered 2015-05-12: 2 mg via INTRAVENOUS

## 2015-05-12 MED ORDER — LACTATED RINGERS IV SOLN
INTRAVENOUS | Status: DC
  Administered 2015-05-12 (×2): via INTRAVENOUS

## 2015-05-12 MED ORDER — CEFAZOLIN SODIUM-DEXTROSE 2-3 GM-% IV SOLR
2.0000 g | Freq: Four times a day (QID) | INTRAVENOUS | Status: AC
  Administered 2015-05-12 – 2015-05-13 (×3): 2 g via INTRAVENOUS
  Filled 2015-05-12 (×3): qty 50

## 2015-05-12 MED ORDER — DIPHENHYDRAMINE HCL 12.5 MG/5ML PO ELIX: 12.5000 mg | ORAL_SOLUTION | ORAL | Status: DC | PRN

## 2015-05-12 MED ORDER — TIOTROPIUM BROMIDE MONOHYDRATE 18 MCG IN CAPS
18.0000 ug | ORAL_CAPSULE | Freq: Every day | RESPIRATORY_TRACT | Status: DC
  Administered 2015-05-12 – 2015-05-14 (×2): 18 ug via RESPIRATORY_TRACT
  Filled 2015-05-12: qty 5

## 2015-05-12 MED ORDER — OXYCODONE HCL 5 MG PO TABS
5.0000 mg | ORAL_TABLET | ORAL | Status: DC | PRN
  Administered 2015-05-12 (×2): 5 mg via ORAL
  Administered 2015-05-13: 10 mg via ORAL
  Administered 2015-05-13: 5 mg via ORAL
  Administered 2015-05-13: 10 mg via ORAL
  Administered 2015-05-14: 5 mg via ORAL
  Filled 2015-05-12 (×2): qty 1
  Filled 2015-05-12: qty 2
  Filled 2015-05-12 (×2): qty 1
  Filled 2015-05-12: qty 2

## 2015-05-12 MED ORDER — ENOXAPARIN SODIUM 40 MG/0.4ML ~~LOC~~ SOLN
40.0000 mg | SUBCUTANEOUS | Status: DC
  Administered 2015-05-13 – 2015-05-14 (×2): 40 mg via SUBCUTANEOUS
  Filled 2015-05-12 (×2): qty 0.4

## 2015-05-12 MED ORDER — FENTANYL CITRATE (PF) 100 MCG/2ML IJ SOLN
INTRAMUSCULAR | Status: DC | PRN
  Administered 2015-05-12 (×3): 50 ug via INTRAVENOUS
  Administered 2015-05-12: 100 ug via INTRAVENOUS

## 2015-05-12 MED ORDER — METOCLOPRAMIDE HCL 5 MG/ML IJ SOLN: 5.0000 mg | Freq: Three times a day (TID) | INTRAMUSCULAR | Status: DC | PRN

## 2015-05-12 MED ORDER — NEOMYCIN-POLYMYXIN B GU 40-200000 IR SOLN
Status: DC | PRN
  Administered 2015-05-12: 6 mL

## 2015-05-12 MED ORDER — HYDROMORPHONE HCL 1 MG/ML IJ SOLN
INTRAMUSCULAR | Status: AC
  Administered 2015-05-12: 0.25 mg via INTRAVENOUS
  Filled 2015-05-12: qty 1

## 2015-05-12 MED ORDER — TIOTROPIUM BROMIDE MONOHYDRATE 18 MCG IN CAPS
18.0000 ug | ORAL_CAPSULE | Freq: Every day | RESPIRATORY_TRACT | Status: DC
  Administered 2015-05-13: 18 ug via RESPIRATORY_TRACT
  Filled 2015-05-12: qty 5

## 2015-05-12 MED ORDER — TIOTROPIUM BROMIDE MONOHYDRATE 18 MCG IN CAPS: 18.0000 ug | ORAL_CAPSULE | Freq: Every day | RESPIRATORY_TRACT | Status: DC

## 2015-05-12 MED ORDER — CEFAZOLIN SODIUM-DEXTROSE 2-3 GM-% IV SOLR
INTRAVENOUS | Status: AC
  Filled 2015-05-12: qty 50

## 2015-05-12 MED ORDER — PANTOPRAZOLE SODIUM 40 MG PO TBEC
40.0000 mg | DELAYED_RELEASE_TABLET | Freq: Every day | ORAL | Status: DC
  Administered 2015-05-12 – 2015-05-14 (×3): 40 mg via ORAL
  Filled 2015-05-12 (×3): qty 1

## 2015-05-12 MED ORDER — BISACODYL 10 MG RE SUPP
10.0000 mg | Freq: Every day | RECTAL | Status: DC | PRN
  Administered 2015-05-14: 10 mg via RECTAL
  Filled 2015-05-12 (×2): qty 1

## 2015-05-12 MED ORDER — MAGNESIUM CITRATE PO SOLN
1.0000 | Freq: Once | ORAL | Status: AC | PRN
  Administered 2015-05-14: 1 via ORAL
  Filled 2015-05-12: qty 296

## 2015-05-12 MED ORDER — MORPHINE SULFATE (PF) 2 MG/ML IV SOLN: 2.0000 mg | INTRAVENOUS | Status: DC | PRN

## 2015-05-12 MED ORDER — MAGNESIUM HYDROXIDE 400 MG/5ML PO SUSP
30.0000 mL | Freq: Every day | ORAL | Status: DC | PRN
  Administered 2015-05-13: 30 mL via ORAL
  Filled 2015-05-12: qty 30

## 2015-05-12 MED ORDER — KETAMINE HCL 10 MG/ML IJ SOLN
INTRAMUSCULAR | Status: DC | PRN
  Administered 2015-05-12 (×3): 25 mg via INTRAVENOUS

## 2015-05-12 MED ORDER — ROCURONIUM BROMIDE 100 MG/10ML IV SOLN
INTRAVENOUS | Status: DC | PRN
  Administered 2015-05-12 (×2): 10 mg via INTRAVENOUS
  Administered 2015-05-12: 20 mg via INTRAVENOUS

## 2015-05-12 SURGICAL SUPPLY — 54 items
BLADE SAW 1/2 (BLADE) ×2 IMPLANT
BNDG COHESIVE 6X5 TAN STRL LF (GAUZE/BANDAGES/DRESSINGS) ×4 IMPLANT
CANISTER SUCT 1200ML W/VALVE (MISCELLANEOUS) ×2 IMPLANT
CAPT HIP TOTAL 3 ×2 IMPLANT
CATH FOL LEG HOLDER (MISCELLANEOUS) ×2 IMPLANT
CATH TRAY METER 16FR LF (MISCELLANEOUS) ×2 IMPLANT
CHLORAPREP W/TINT 26ML (MISCELLANEOUS) ×2 IMPLANT
DECANTER SPIKE VIAL GLASS SM (MISCELLANEOUS) ×2 IMPLANT
DRAPE C-ARM XRAY 36X54 (DRAPES) ×2 IMPLANT
DRAPE C-SECTION (MISCELLANEOUS) ×2 IMPLANT
DRAPE INCISE IOBAN 66X60 STRL (DRAPES) IMPLANT
DRAPE POUCH INSTRU U-SHP 10X18 (DRAPES) ×2 IMPLANT
DRAPE SHEET LG 3/4 BI-LAMINATE (DRAPES) ×6 IMPLANT
DRAPE STERI IOBAN 125X83 (DRAPES) ×2 IMPLANT
DRAPE TABLE BACK 80X90 (DRAPES) ×2 IMPLANT
DRSG OPSITE POSTOP 4X8 (GAUZE/BANDAGES/DRESSINGS) ×4 IMPLANT
ELECT BLADE 6.5 EXT (BLADE) ×2 IMPLANT
GAUZE SPONGE 4X4 12PLY STRL (GAUZE/BANDAGES/DRESSINGS) ×2 IMPLANT
GLOVE BIO SURGEON STRL SZ7 (GLOVE) ×2 IMPLANT
GLOVE BIOGEL PI IND STRL 9 (GLOVE) ×1 IMPLANT
GLOVE BIOGEL PI INDICATOR 9 (GLOVE) ×1
GLOVE INDICATOR 7.5 STRL GRN (GLOVE) ×2 IMPLANT
GLOVE SURG ORTHO 9.0 STRL STRW (GLOVE) ×2 IMPLANT
GOWN SPECIALTY ULTRA XL (MISCELLANEOUS) ×2 IMPLANT
GOWN STRL REUS W/ TWL LRG LVL3 (GOWN DISPOSABLE) ×1 IMPLANT
GOWN STRL REUS W/TWL LRG LVL3 (GOWN DISPOSABLE) ×1
HEMOVAC 400CC 10FR (MISCELLANEOUS) ×2 IMPLANT
HOOD PEEL AWAY FACE SHEILD DIS (HOOD) ×2 IMPLANT
MAT BLUE FLOOR 46X72 FLO (MISCELLANEOUS) ×2 IMPLANT
NDL SAFETY 18GX1.5 (NEEDLE) ×2 IMPLANT
NEEDLE FILTER BLUNT 18X 1/2SAF (NEEDLE) ×2
NEEDLE FILTER BLUNT 18X1 1/2 (NEEDLE) ×2 IMPLANT
NEEDLE SPNL 18GX3.5 QUINCKE PK (NEEDLE) ×2 IMPLANT
NS IRRIG 1000ML POUR BTL (IV SOLUTION) ×2 IMPLANT
NS IRRIG 500ML POUR BTL (IV SOLUTION) ×2 IMPLANT
PACK HIP COMPR (MISCELLANEOUS) ×2 IMPLANT
SOL PREP PVP 2OZ (MISCELLANEOUS) ×2
SOLUTION PREP PVP 2OZ (MISCELLANEOUS) ×1 IMPLANT
STAPLER SKIN PROX 35W (STAPLE) ×2 IMPLANT
STRAP SAFETY BODY (MISCELLANEOUS) ×2 IMPLANT
SUT DVC 2 QUILL PDO  T11 36X36 (SUTURE) ×1
SUT DVC 2 QUILL PDO T11 36X36 (SUTURE) ×1 IMPLANT
SUT DVC QUILL MONODERM 30X30 (SUTURE) ×2 IMPLANT
SUT ETHIBOND NAB CT1 #1 30IN (SUTURE) ×2 IMPLANT
SUT SILK 0 (SUTURE) ×1
SUT SILK 0 30XBRD TIE 6 (SUTURE) ×1 IMPLANT
SUT VIC AB 1 CT1 36 (SUTURE) ×2 IMPLANT
SYR 20CC LL (SYRINGE) ×2 IMPLANT
SYR 30ML LL (SYRINGE) ×2 IMPLANT
SYR 5ML LL (SYRINGE) ×2 IMPLANT
SYRINGE 10CC LL (SYRINGE) ×2 IMPLANT
TAPE MICROFOAM 4IN (TAPE) ×2 IMPLANT
TUBE KAMVAC SUCTION (TUBING) IMPLANT
WATER STERILE IRR 1000ML POUR (IV SOLUTION) IMPLANT

## 2015-05-12 NOTE — Telephone Encounter (Signed)
Refill request was sent to Dr. Krichna Sowles for approval and submission.  

## 2015-05-12 NOTE — Op Note (Signed)
05/12/2015  1:25 PM  PATIENT:  Wendy Johnson  49 y.o. female  PRE-OPERATIVE DIAGNOSIS:  DEGENERATIVE OSTEOARTHRITIS  POST-OPERATIVE DIAGNOSIS:  degenerative osteoarthritis right hip  PROCEDURE:  Procedure(s): TOTAL HIP ARTHROPLASTY ANTERIOR APPROACH (Right)  SURGEON: Laurene Footman, MD  ASSISTANTS: None  ANESTHESIA:   general  EBL:  Total I/O In: 1300 [I.V.:1300] Out: 1050 [Urine:150; Blood:900]  BLOOD ADMINISTERED:none  DRAINS: (Medium) Hemovact drain(s) in the Subcutaneous layer with  Suction Open   LOCAL MEDICATIONS USED:  MARCAINE     SPECIMEN:  Source of Specimen:  Femoral head  DISPOSITION OF SPECIMEN:  PATHOLOGY  COUNTS:  YES  TOURNIQUET:  * No tourniquets in log *  IMPLANTS: Medacta, AMIS 4 STEM with 52 mpact cup DM with liner and S 28 mm head  DICTATION: .Dragon Dictation   The patient was brought to the operating room and after general anesthesia was obtained patient was placed on the operative table with the ipsilateral foot into the Medacta attachment, contralateral leg on a well-padded table. C-arm was brought in and preop template x-ray taken. After prepping and draping in usual sterile fashion appropriate patient identification and timeout procedures were completed. Anterior approach to the hip was obtained and centered over the greater trochanter and TFL muscle. The subcutaneous tissue was incised hemostasis being achieved by electrocautery. TFL fascia was incised and the muscle retracted laterally deep retractor placed. The lateral femoral circumflex vessels were identified and ligated. The anterior capsule was exposed and a capsulotomy performed. The neck was identified and a femoral neck cut carried out with a saw. The head was removed without difficulty and showed sclerotic femoral head and acetabulum. Reaming was carried out to 50 mm and a 52 mm cup trial gave appropriate tightness to the acetabular component a 34 Mpact DM cup was impacted into position.  The leg was then externally rotated and ischiofemoral and pubofemoral releases carried out. The femur was sequentially broached to a size 4, size 4 standard trials were placed and the final components chosen. The 4 stem was inserted along with a S 28 mm head and 52 mm liner. The hip was reduced and was stable the wound was thoroughly irrigated with a dilute Betadine solution. The deep fascia view. Using a heavy Quill after infiltration of 30 cc of quarter percent Sensorcaine with epinephrine. Subcutaneous drains were then inserted 2-0 Quill to close the skin, with skin staples Xeroform 4 x 4's ABDs and tape.  PLAN OF CARE: Admit to inpatient

## 2015-05-12 NOTE — Transfer of Care (Signed)
Immediate Anesthesia Transfer of Care Note  Patient: Dorthey Hillenbrand Motta  Procedure(s) Performed: Procedure(s): TOTAL HIP ARTHROPLASTY ANTERIOR APPROACH (Right)  Patient Location: PACU  Anesthesia Type:General  Level of Consciousness: sedated  Airway & Oxygen Therapy: Patient Spontanous Breathing and Patient connected to face mask oxygen  Post-op Assessment: Report given to RN  Post vital signs: Reviewed  Last Vitals:  Filed Vitals:   05/12/15 1337 05/12/15 1338  BP: 116/102 132/86  Pulse: 89 85  Temp: 36.4 C   Resp: 20 12    Complications: No apparent anesthesia complications

## 2015-05-12 NOTE — H&P (Signed)
Reviewed paper H+P, will be scanned into chart. No changes noted.  

## 2015-05-12 NOTE — Anesthesia Preprocedure Evaluation (Addendum)
Anesthesia Evaluation  Patient identified by MRN, date of birth, ID band Patient awake    Reviewed: Allergy & Precautions, H&P , NPO status , Patient's Chart, lab work & pertinent test results, reviewed documented beta blocker date and time   Airway Mallampati: II  TM Distance: >3 FB Neck ROM: full    Dental  (+) Teeth Intact   Pulmonary neg pulmonary ROS, sleep apnea , COPD, Current Smoker,    Pulmonary exam normal        Cardiovascular hypertension, negative cardio ROS Normal cardiovascular exam Rhythm:regular Rate:Normal     Neuro/Psych  Headaches, PSYCHIATRIC DISORDERS Anxiety Depression Bipolar Disorder negative neurological ROS  negative psych ROS   GI/Hepatic negative GI ROS, Neg liver ROS, GERD  ,  Endo/Other  negative endocrine ROS  Renal/GU negative Renal ROS  negative genitourinary   Musculoskeletal   Abdominal   Peds  Hematology negative hematology ROS (+)   Anesthesia Other Findings   Reproductive/Obstetrics negative OB ROS                            Anesthesia Physical Anesthesia Plan  ASA: III  Anesthesia Plan: General ETT   Post-op Pain Management:    Induction:   Airway Management Planned:   Additional Equipment:   Intra-op Plan:   Post-operative Plan:   Informed Consent: I have reviewed the patients History and Physical, chart, labs and discussed the procedure including the risks, benefits and alternatives for the proposed anesthesia with the patient or authorized representative who has indicated his/her understanding and acceptance.     Plan Discussed with: CRNA  Anesthesia Plan Comments:         Anesthesia Quick Evaluation

## 2015-05-12 NOTE — Anesthesia Procedure Notes (Signed)
Procedure Name: Intubation Performed by: Charnay Nazario Pre-anesthesia Checklist: Patient identified, Patient being monitored, Timeout performed, Emergency Drugs available and Suction available Patient Re-evaluated:Patient Re-evaluated prior to inductionOxygen Delivery Method: Circle system utilized Preoxygenation: Pre-oxygenation with 100% oxygen Intubation Type: IV induction Ventilation: Mask ventilation without difficulty Laryngoscope Size: Miller and 2 Grade View: Grade I Tube type: Oral Tube size: 7.0 mm Number of attempts: 1 Airway Equipment and Method: Stylet Placement Confirmation: ETT inserted through vocal cords under direct vision,  positive ETCO2 and breath sounds checked- equal and bilateral Secured at: 20 cm Tube secured with: Tape Dental Injury: Teeth and Oropharynx as per pre-operative assessment        

## 2015-05-12 NOTE — Progress Notes (Signed)
PT Cancellation Note  Patient Details Name: Wendy Johnson MRN: [id number] DOB: December 11, 1965   Cancelled Treatment:    Reason Eval/Treat Not Completed: Other (comment). Evaluation attempted on POD 0 with pt in severe pain, rating at 10/10. Full sensation and strength intact per patient. Pt wishes to begin therapy next date when pain under control. RN to bring more meds. Will re-attempt tomorrow.   Bexleigh Theriault 05/12/2015, 3:44 PM Greggory Stallion, PT, DPT [phone]

## 2015-05-13 LAB — HEMOGLOBIN: Hemoglobin: 10.6 g/dL — ABNORMAL LOW (ref 12.0–16.0)

## 2015-05-13 NOTE — Evaluation (Signed)
Occupational Therapy Evaluation Patient Details Name: Wendy Johnson MRN: [id number] DOB: 07-Feb-1966 Today's Date: 05/13/2015    History of Present Illness Pt admitted for R THA. Pt with history of L THA in Aug 2016.    Clinical Impression   This patient is a 50 year old female who came to [facility] for a R total hip replacement (anterior approach).  Patient lives with her husband in a  home with 3 steps to enter.  She had been independent with ADL and functional mobility with increasing pain.  She has pain, reduced mobility but over all does well. She has had both knees replaced and now both hips. She knows the routine. Will just be available if needed.       Follow Up Recommendations   (home with home health PT. OT will not be needed after discharge.)    Equipment Recommendations       Recommendations for Other Services       Precautions / Restrictions Precautions Precautions: Anterior Hip Precaution Booklet Issued: No Restrictions Weight Bearing Restrictions: Yes RLE Weight Bearing: Weight bearing as tolerated      Mobility Bed Mobility Overal bed mobility: Needs Assistance Bed Mobility: Supine to Sit     Supine to sit: Min guard     General bed mobility comments: safe technqiue with bed mobility with cues required for sequencing. Pt with slow movement, secondary to pain  Transfers Overall transfer level: Needs assistance Equipment used: Rolling walker (2 wheeled) Transfers: Sit to/from Stand Sit to Stand: Min assist         General transfer comment: transfers performed with rw with slow technique. Once standing, pt able to stand with upright posture.     Balance Overall balance assessment: Needs assistance Sitting-balance support: Bilateral upper extremity supported;Feet supported Sitting balance-Leahy Scale: Good     Standing balance support: Bilateral upper extremity supported Standing balance-Leahy Scale: Good                               ADL                                         General ADL Comments: Patient had been independent with ADL and mobility with increasing pain. She does not drive.  Reviewed adapted equipment for lower body dressing and instructed because she is an anterior hip, will most likely not need them.      Vision     Perception     Praxis      Pertinent Vitals/Pain Pain Assessment: 0-10 Pain Score: 8  Pain Location: R hip Pain Descriptors / Indicators: Operative site guarding Pain Intervention(s): Limited activity within patient's tolerance;Premedicated before session;Ice applied     Hand Dominance Right   Extremity/Trunk Assessment Upper Extremity Assessment Upper Extremity Assessment: Overall WFL for tasks assessed   Lower Extremity Assessment Lower Extremity Assessment: Defer to PT evaluation       Communication Communication Communication: No difficulties   Cognition Arousal/Alertness: Awake/alert Behavior During Therapy: WFL for tasks assessed/performed Overall Cognitive Status: Within Functional Limits for tasks assessed                     General Comments       Exercises Exercises: Other exercises Other Exercises Other Exercises: supine ther-ex performed including R LE  ankle pumps, quad sets, glut squeezes, hip ab/add, and SAQ. ALl ther-ex performed x 10 reps with min assist secondary to pain.   Shoulder Instructions      Home Living Family/patient expects to be discharged to:: Private residence Living Arrangements: Spouse/significant other Available Help at Discharge: Family;Friend(s) Type of Home: House Home Access: Stairs to enter [employer] of Steps: 3 Entrance Stairs-Rails: None Home Layout: Multi-level;Able to live on main level with bedroom/bathroom               Home Equipment: Walker - 2 wheels          Prior Functioning/Environment Level of Independence: Independent         Comments: Does not drive    OT Diagnosis: Acute pain   OT Problem List: Decreased range of motion;Decreased activity tolerance (Patient has had both knees and now both hips replaced and knows the routeen. Will be available if needed.)   OT Treatment/Interventions:      OT Goals(Current goals can be found in the care plan section) Acute Rehab OT Goals Patient Stated Goal: to go home tomorrow OT Goal Formulation: With patient Time For Goal Achievement: 05/27/15 Potential to Achieve Goals: Good  OT Frequency:     Barriers to D/C:            Co-evaluation              End of Session Equipment Utilized During Treatment:  (hip kit)  Activity Tolerance:   Patient left: in bed;with call bell/phone within reach;with bed alarm set   Time: 1049-1101 OT Time Calculation (min): 12 min Charges:  OT General Charges $OT Visit: 1 Procedure OT Evaluation $OT Eval Low Complexity: 1 Procedure G-Codes:    Myrene Galas, MS/OTR/L  05/13/2015, 11:52 AM

## 2015-05-13 NOTE — Evaluation (Signed)
Physical Therapy Evaluation Patient Details Name: Wendy Johnson MRN: [id number] DOB: 17-Jan-1966 Today's Date: 05/13/2015   History of Present Illness  Pt admitted for R THA. Pt with history of L THA in Aug 2016.   Clinical Impression  Pt is a pleasant 50 year old female who was admitted for R THA. Pt performs bed mobility with cga, transfers with min assist, and ambulation with cga with rw. Pt demonstrates deficits with strength/pain/balance/endurance. Would benefit from skilled PT to address above deficits and promote optimal return to PLOF. Pt reports she has lots of support at home and is very motivated to perform therapy. Pt performed all mobility on room air with sats at 94%. No SOB symptoms noted, left on room air.      Follow Up Recommendations Home health PT    Equipment Recommendations       Recommendations for Other Services       Precautions / Restrictions Precautions Precautions: Anterior Hip Precaution Booklet Issued: No Restrictions Weight Bearing Restrictions: Yes RLE Weight Bearing: Weight bearing as tolerated      Mobility  Bed Mobility Overal bed mobility: Needs Assistance Bed Mobility: Supine to Sit     Supine to sit: Min guard     General bed mobility comments: safe technqiue with bed mobility with cues required for sequencing. Pt with slow movement, secondary to pain  Transfers Overall transfer level: Needs assistance Equipment used: Rolling walker (2 wheeled) Transfers: Sit to/from Stand Sit to Stand: Min assist         General transfer comment: transfers performed with rw with slow technique. Once standing, pt able to stand with upright posture.   Ambulation/Gait Ambulation/Gait assistance: Min guard Ambulation Distance (Feet): 40 Feet Assistive device: Rolling walker (2 wheeled) Gait Pattern/deviations: Step-through pattern     General Gait Details: good ambulation distance, however fatigues with further distance. Slow gait speed. Pt  able to achieve reciprocal gait pattern. Safe technique  Stairs            Wheelchair Mobility    Modified Rankin (Stroke Patients Only)       Balance Overall balance assessment: Needs assistance Sitting-balance support: Bilateral upper extremity supported;Feet supported Sitting balance-Leahy Scale: Good     Standing balance support: Bilateral upper extremity supported Standing balance-Leahy Scale: Good                               Pertinent Vitals/Pain Pain Assessment: 0-10 Pain Score: 8  Pain Location: R hip Pain Descriptors / Indicators: Operative site guarding Pain Intervention(s): Limited activity within patient's tolerance;Premedicated before session;Ice applied    Home Living Family/patient expects to be discharged to:: Private residence Living Arrangements: Spouse/significant other Available Help at Discharge: Family;Friend(s) Type of Home: House Home Access: Stairs to enter Entrance Stairs-Rails: None Entrance Stairs-Number of Steps: 3 Home Layout: Multi-level;Able to live on main level with bedroom/bathroom Home Equipment: Gilford Rile - 2 wheels      Prior Function Level of Independence: Independent         Comments: Does not drive     Hand Dominance        Extremity/Trunk Assessment   Upper Extremity Assessment: Overall WFL for tasks assessed           Lower Extremity Assessment: RLE deficits/detail (grossly 3+/5)         Communication   Communication: No difficulties  Cognition Arousal/Alertness: Awake/alert Behavior During Therapy: WFL for tasks assessed/performed  Overall Cognitive Status: Within Functional Limits for tasks assessed                      General Comments      Exercises Other Exercises Other Exercises: supine ther-ex performed including R LE ankle pumps, quad sets, glut squeezes, hip ab/add, and SAQ. ALl ther-ex performed x 10 reps with min assist secondary to pain.       Assessment/Plan    PT Assessment Patient needs continued PT services  PT Diagnosis Difficulty walking;Acute pain;Generalized weakness   PT Problem List Decreased strength;Decreased mobility;Pain  PT Treatment Interventions Gait training;Therapeutic exercise   PT Goals (Current goals can be found in the Care Plan section) Acute Rehab PT Goals Patient Stated Goal: to go home tomorrow PT Goal Formulation: With patient Time For Goal Achievement: 05/27/15 Potential to Achieve Goals: Good    Frequency BID   Barriers to discharge        Co-evaluation               End of Session Equipment Utilized During Treatment: Gait belt Activity Tolerance: Patient tolerated treatment well Patient left: in chair;with chair alarm set Nurse Communication: Mobility status         Time: [id number] PT Time Calculation (min) (ACUTE ONLY): 30 min   Charges:   PT Evaluation $PT Eval Moderate Complexity: 1 Procedure PT Treatments $Therapeutic Exercise: 8-22 mins   PT G Codes:        Hanin Decook 05-26-2015, 10:23 AM  Greggory Stallion, PT, DPT [phone]

## 2015-05-13 NOTE — Clinical Social Work Note (Signed)
Clinical Social Worker consulted for [employer]. PT is recommending HHPT. CSW spoke with [facility] regarding discharge plan. RNCM is following for discharge planning needs. CSW is signing off as no further needs identified.   Darden Dates, MSW, LCSW  Clinical Social Worker  [phone]

## 2015-05-13 NOTE — Progress Notes (Signed)
Physical Therapy Treatment Patient Details Name: Wendy Johnson MRN: [id number] DOB: 05-21-1965 Today's Date: 05/13/2015    History of Present Illness Pt admitted for R THA. Pt with history of L THA in Aug 2016.     PT Comments    Pt is making good progress towards goals with increased gait speed and fluid gait pattern. Pt very motivated to participate in therapy and really wants to dc home next date. Pt needs to complete stair training prior to discharge. Good endurance with hep, still limited by pain.  Follow Up Recommendations  Home health PT     Equipment Recommendations       Recommendations for Other Services       Precautions / Restrictions Precautions Precautions: Anterior Hip Precaution Booklet Issued: Yes (comment) Restrictions Weight Bearing Restrictions: Yes RLE Weight Bearing: Weight bearing as tolerated    Mobility  Bed Mobility Overal bed mobility: Needs Assistance Bed Mobility: Supine to Sit;Sit to Supine     Supine to sit: Min guard     General bed mobility comments: Improved sequencing noted without cues needed. Slight assistance required for bringing R LE off bed.  Transfers Overall transfer level: Needs assistance Equipment used: Rolling walker (2 wheeled) Transfers: Sit to/from Stand Sit to Stand: Supervision         General transfer comment: safe technique performed. No cues for sequencing required. RW used  Ambulation/Gait Ambulation/Gait assistance: Supervision Ambulation Distance (Feet): 190 Feet Assistive device: Rolling walker (2 wheeled) Gait Pattern/deviations: Step-through pattern     General Gait Details: ambulated with reciprocal gait pattern. safe technique performed with upright posture. Pt able to complete 10' walk test in 5 seconds. Can carry conversation during ambulation, no increase in pain with mobility.   Stairs            Wheelchair Mobility    Modified Rankin (Stroke Patients Only)       Balance                                    Cognition Arousal/Alertness: Awake/alert Behavior During Therapy: WFL for tasks assessed/performed Overall Cognitive Status: Within Functional Limits for tasks assessed                      Exercises Other Exercises Other Exercises: supine ther-ex performed including R LE ankle pumps, quad sets, glut squeezes, hip ab/add, and LAQ. ALl ther-ex performed x 10 reps with min assist secondary to pain.    General Comments        Pertinent Vitals/Pain Pain Assessment: 0-10 Pain Score: 5  Pain Location: R hip Pain Descriptors / Indicators: Operative site guarding Pain Intervention(s): Limited activity within patient's tolerance;Premedicated before session    Home Living Family/patient expects to be discharged to:: Private residence Living Arrangements: Spouse/significant other Available Help at Discharge: Family;Friend(s) Type of Home: House Home Access: Stairs to enter Entrance Stairs-Rails: None Home Layout: Multi-level;Able to live on main level with bedroom/bathroom Home Equipment: Walker - 2 wheels      Prior Function Level of Independence: Independent          PT Goals (current goals can now be found in the care plan section) Acute Rehab PT Goals Patient Stated Goal: to go home tomorrow PT Goal Formulation: With patient Time For Goal Achievement: 05/27/15 Potential to Achieve Goals: Good Progress towards PT goals: Progressing toward goals    Frequency  BID    PT Plan Current plan remains appropriate    Co-evaluation             End of Session Equipment Utilized During Treatment: Gait belt Activity Tolerance: Patient tolerated treatment well Patient left: in bed;with bed alarm set     Time: [id number] PT Time Calculation (min) (ACUTE ONLY): 23 min  Charges:  $Gait Training: 8-22 mins $Therapeutic Exercise: 8-22 mins                    G Codes:      Ogden Handlin 2015-06-01, 2:49 PM  Greggory Stallion, PT, DPT [phone]

## 2015-05-13 NOTE — Care Management Note (Signed)
Case Management Note  Patient Details  Name: Wendy Johnson MRN: [id number] Date of Birth: 1965-07-24  Subjective/Objective:                Patient admitted status post total hip arthroplasty.  Patient lives at home with her boyfriend and son.  Patient has 2 sisters and a mother who will be coming to assist her during the day.  Patient has a rolling walker and BSC at home from previous surgery.  Patient is inquiring about a 4 prong cane.   Per [place] with [facility] out of pocket expense $20 will notify patient.  Patient's sister or friend named Inez Catalina will take the patient to follow up appointments.  Patient states "I want to use the home health I had last time."  Patient was opened with [facility] in August 2016.  Home health order is in, [place] with [facility] notified of referral   Action/Plan: Lovenox 40 mg qd sq #14 called in to [facility].   Expected Discharge Date:                  Expected Discharge Plan:     In-House Referral:     Discharge planning Services     Post Acute Care Choice:    Choice offered to:     DME Arranged:    DME Agency:     HH Arranged:    [place] Agency:     Status of Service:     Medicare Important Message Given:    Date Medicare IM Given:    Medicare IM give by:    Date Additional Medicare IM Given:    Additional Medicare Important Message give by:     If discussed at [place] of Stay Meetings, dates discussed:    Additional Comments:  Beverly Sessions, RN 05/13/2015, 9:39 AM

## 2015-05-13 NOTE — Progress Notes (Addendum)
   Subjective: 1 Day Post-Op Procedure(s) (LRB): TOTAL HIP ARTHROPLASTY ANTERIOR APPROACH (Right) Patient reports pain as mild and moderate.   Patient is well, and has had no acute complaints or problems We will start therapy today.  Plan is to go Home after hospital stay.  Objective: Vital signs in last 24 hours: Temp:  [97 F (36.1 C)-98.8 F (37.1 C)] 98.8 F (37.1 C) (01/04 0502) Pulse Rate:  [79-107] 107 (01/04 0502) Resp:  [12-22] 18 (01/04 0502) BP: (75-132)/(43-102) 114/74 mmHg (01/04 0502) SpO2:  [77 %-100 %] 94 % (01/04 0502) Weight:  [96.163 kg (212 lb)] 96.163 kg (212 lb) (01/03 1007)  Intake/Output from previous day: 01/03 0701 - 01/04 0700 In: 3056.7 [P.O.:240; I.V.:2666.7; IV Piggyback:150] Out: [id number] [Urine:4140; Blood:900] Intake/Output this shift:     Recent Labs  05/12/15 1656 05/13/15 0529  HGB 12.0 10.6*    Recent Labs  05/12/15 1656  WBC 17.2*  RBC 4.36  HCT 36.7  PLT 206    Recent Labs  05/12/15 1101 05/12/15 1656  K 3.7  --   CREATININE  --  0.56   No results for input(s): LABPT, INR in the last 72 hours.  EXAM General - Patient is Alert, Appropriate and Oriented Extremity - Neurovascular intact Sensation intact distally Intact pulses distally Dorsiflexion/Plantar flexion intact Dressing - dressing C/D/I, no drainage and hemovac intact Motor Function - intact, moving foot and toes well on exam.   Past Medical History  Diagnosis Date  . High cholesterol   . GERD (gastroesophageal reflux disease)   . Anxiety   . Depression   . OCD (obsessive compulsive disorder)   . Chronic back pain   . Chronic constipation   . Severe obesity ([place])   . AR (allergic rhinitis)   . Trochanteric bursitis of right hip   . Tobacco use   . Hepatomegaly   . Dyslipidemia   . COPD (chronic obstructive pulmonary disease) ([place])   . Decreased dorsalis pedis pulse   . Fatty liver   . Chronic insomnia   . Benign hypertension   . Deaf   . Hot  flashes   . Migraine with aura   . Arthritis     Assessment/Plan:   1 Day Post-Op Procedure(s) (LRB): TOTAL HIP ARTHROPLASTY ANTERIOR APPROACH (Right) Active Problems:   Primary osteoarthritis of right hip   Acute post op blood loss anemia    Estimated body mass index is 36.37 kg/(m^2) as calculated from the following:   Height as of this encounter: 5\' 4"  (1.626 m).   Weight as of this encounter: 96.163 kg (212 lb). Advance diet Up with therapy  Needs BM Recheck labs in the am  DVT Prophylaxis - Lovenox, Foot Pumps and TED hose Weight-Bearing as tolerated to right leg D/C O2 and Pulse OX and try on Room Air  T. Rachelle Hora, PA-C [place] 05/13/2015, 7:15 AM

## 2015-05-13 NOTE — Care Management (Signed)
Updated patient that out of cost expense of cane is $20.  Patient does not want to get one at this time

## 2015-05-14 ENCOUNTER — Inpatient Hospital Stay: Payer: Medicare Other

## 2015-05-14 LAB — SURGICAL PATHOLOGY

## 2015-05-14 MED ORDER — OXYCODONE HCL 5 MG PO TABS: 5.0000 mg | ORAL_TABLET | ORAL | Status: DC | PRN

## 2015-05-14 MED ORDER — LEVOFLOXACIN 750 MG PO TABS: 750.0000 mg | ORAL_TABLET | Freq: Every day | ORAL | Status: DC

## 2015-05-14 MED ORDER — LEVOFLOXACIN 500 MG PO TABS
750.0000 mg | ORAL_TABLET | Freq: Every day | ORAL | Status: DC
  Administered 2015-05-14: 750 mg via ORAL
  Filled 2015-05-14: qty 1

## 2015-05-14 MED ORDER — ENOXAPARIN SODIUM 40 MG/0.4ML ~~LOC~~ SOLN: 40.0000 mg | SUBCUTANEOUS | Status: DC

## 2015-05-14 NOTE — Progress Notes (Signed)
Physical Therapy Treatment Patient Details Name: Wendy Johnson MRN: [id number] DOB: [birth year] Today's Date: 05/14/2015    History of Present Illness Pt admitted for R THA. Pt with history of L THA in Aug 2016.     PT Comments    Pt limited in therapy progress this date secondary to lethargy and decreased O2 sats. Pt ambulated on room air with sats decreasing to 78%. RN notified and pt used WC, discontinued ambulation. 2.5L of O2 applied. Pt demonstrates safe technique with stair training and is still motivated to perform therapy. Will re-assess ambulation this PM and attempt there-ex pending O2 sat levels.  Follow Up Recommendations  Home health PT     Equipment Recommendations       Recommendations for Other Services       Precautions / Restrictions Precautions Precautions: Anterior Hip Precaution Booklet Issued: Yes (comment) Restrictions Weight Bearing Restrictions: Yes RLE Weight Bearing: Weight bearing as tolerated    Mobility  Bed Mobility Overal bed mobility: Modified Independent Bed Mobility: Supine to Sit     Supine to sit: Modified independent (Device/Increase time)     General bed mobility comments: No assistance needed this session. Safe technique perfromed. Bed slightly elevated to assist with transfer  Transfers Overall transfer level: Modified independent Equipment used: Rolling walker (2 wheeled) Transfers: Sit to/from Stand Sit to Stand: Supervision         General transfer comment: safe technique performed. No cues for sequencing required. RW used  Ambulation/Gait Ambulation/Gait assistance: Supervision Ambulation Distance (Feet): 50 Feet Assistive device: Rolling walker (2 wheeled) Gait Pattern/deviations: Step-through pattern     General Gait Details: Limited ambulation this date secondary to decreased O2 sats. Reciprocal gait pattern performed.   Stairs Stairs: Yes Stairs assistance: Min guard Stair Management: No rails;With  walker Number of Stairs: 3 General stair comments: Therapist demonstrated technique prior to performance. Pt able to demonstrate safe performance of stair training with + 2 assist for equipment management. STep to gait pattern performed with cues for sequencing.   Wheelchair Mobility    Modified Rankin (Stroke Patients Only)       Balance                                    Cognition Arousal/Alertness: Lethargic Behavior During Therapy: WFL for tasks assessed/performed Overall Cognitive Status: Within Functional Limits for tasks assessed                      Exercises Other Exercises Other Exercises: Pt used BSC with cga for on/off toliet and supervision for clean up. 1 cue for propping out LE prior to sitting down for comfort.    General Comments        Pertinent Vitals/Pain Pain Assessment: Faces Faces Pain Scale: Hurts little more Pain Location: R hip Pain Descriptors / Indicators: Operative site guarding Pain Intervention(s): Limited activity within patient's tolerance    Home Living                      Prior Function            PT Goals (current goals can now be found in the care plan section) Acute Rehab PT Goals Patient Stated Goal: to go home tomorrow PT Goal Formulation: With patient Time For Goal Achievement: 05/27/15 Potential to Achieve Goals: Good Progress towards PT goals: Progressing toward goals  Frequency  BID    PT Plan Current plan remains appropriate    Co-evaluation             End of Session Equipment Utilized During Treatment: Gait belt;Oxygen Activity Tolerance: Patient limited by lethargy Patient left: in chair;with chair alarm set     Time: [phone] PT Time Calculation (min) (ACUTE ONLY): 33 min  Charges:  $Gait Training: 8-22 mins $Therapeutic Activity: 8-22 mins                    G Codes:      Jabrea Kallstrom 05-16-15, 11:41 AM  Greggory Stallion, PT, DPT [phone]

## 2015-05-14 NOTE — Discharge Instructions (Signed)

## 2015-05-14 NOTE — Progress Notes (Signed)
Order to discharge patient to home today with home health, sister at the bedside. Discharge instructions given per MD order, home and new medications reviewed with patient.  Rx.slips given. Patient verbalized understanding of discharge instructions given. Discharge via wheelchair with nurse tech -B.

## 2015-05-14 NOTE — Progress Notes (Signed)
   Subjective: 2 Days Post-Op Procedure(s) (LRB): TOTAL HIP ARTHROPLASTY ANTERIOR APPROACH (Right) Patient reports pain as mild.   Patient is well, and has had no acute complaints or problems We will continue therapy today.  Plan is to go Home after hospital stay.  Objective: Vital signs in last 24 hours: Temp:  [98.3 F (36.8 C)-100.2 F (37.9 C)] 100.1 F (37.8 C) (01/05 0413) Pulse Rate:  [98-121] 121 (01/05 0414) Resp:  [18] 18 (01/05 0413) BP: (102-124)/(66-81) 102/66 mmHg (01/05 0413) SpO2:  [89 %-98 %] 91 % (01/05 0414)  Intake/Output from previous day: 01/04 0701 - 01/05 0700 In: 240 [P.O.:240] Out: 735 [Urine:600; Drains:135] Intake/Output this shift:     Recent Labs  05/12/15 1656 05/13/15 0529  HGB 12.0 10.6*    Recent Labs  05/12/15 1656  WBC 17.2*  RBC 4.36  HCT 36.7  PLT 206    Recent Labs  05/12/15 1101 05/12/15 1656  K 3.7  --   CREATININE  --  0.56   No results for input(s): LABPT, INR in the last 72 hours.  EXAM General - Patient is Alert, Appropriate and Oriented Extremity - Neurovascular intact Sensation intact distally Intact pulses distally Dorsiflexion/Plantar flexion intact Dressing - dressing C/D/I, no drainage and hemovac removed Motor Function - intact, moving foot and toes well on exam.   Past Medical History  Diagnosis Date  . High cholesterol   . GERD (gastroesophageal reflux disease)   . Anxiety   . Depression   . OCD (obsessive compulsive disorder)   . Chronic back pain   . Chronic constipation   . Severe obesity ([place])   . AR (allergic rhinitis)   . Trochanteric bursitis of right hip   . Tobacco use   . Hepatomegaly   . Dyslipidemia   . COPD (chronic obstructive pulmonary disease) ([place])   . Decreased dorsalis pedis pulse   . Fatty liver   . Chronic insomnia   . Benign hypertension   . Deaf   . Hot flashes   . Migraine with aura   . Arthritis     Assessment/Plan:   2 Days Post-Op Procedure(s)  (LRB): TOTAL HIP ARTHROPLASTY ANTERIOR APPROACH (Right) Active Problems:   Primary osteoarthritis of right hip   Acute post op blood loss anemia    Estimated body mass index is 36.37 kg/(m^2) as calculated from the following:   Height as of this encounter: 5\' 4"  (1.626 m).   Weight as of this encounter: 96.163 kg (212 lb). Advance diet Up with therapy  Needs BM Plan on discharge to home with HHPT today pending BM. Follow up with [facility] in 2 weeks  DVT Prophylaxis - Lovenox, Foot Pumps and TED hose Weight-Bearing as tolerated to right leg D/C O2 and Pulse OX and try on Room Air  T. Rachelle Hora, PA-C [facility] 05/14/2015, 7:21 AM

## 2015-05-14 NOTE — Care Management Important Message (Signed)
Important Message  Patient Details  Name: Wendy Johnson MRN: [id number] Date of Birth: 01-26-1966   Medicare Important Message Given:  Yes    Juliann Pulse A Latresha Yahr 05/14/2015, 10:50 AM

## 2015-05-14 NOTE — Discharge Summary (Signed)
Physician Discharge Summary  Patient ID: KATARYNA KIN MRN: [id number] DOB/AGE: [birth year] 50 y.o.  Admit date: 05/12/2015 Discharge date: 05/14/2015  Admission Diagnoses:  DEGENERATIVE OSTEOARTHRITIS   Discharge Diagnoses: Patient Active Problem List   Diagnosis Date Noted  . Primary osteoarthritis of right hip 05/12/2015  . Sleep apnea 04/07/2015  . Primary osteoarthritis of left hip 12/16/2014  . Metabolic syndrome [id number]  . Migraine without aura and without status migrainosus, not intractable 11/26/2014  . GERD without esophagitis 11/26/2014  . COPD, moderate ([place]) 11/26/2014  . Nocturnal oxygen desaturation 11/26/2014  . Supplemental oxygen dependent 11/26/2014  . Hearing loss 11/26/2014  . Chronic insomnia 11/26/2014  . History of hypertension 11/26/2014  . Dyslipidemia 11/26/2014  . History of morbid obesity 11/26/2014  . History of diabetes mellitus, type II 11/26/2014  . Chronic constipation 11/26/2014  . Generalized anxiety disorder 11/11/2014  . DDD (degenerative disc disease), lumbar 11/11/2014  . Facet arthritis of lumbar region 11/11/2014  . Primary osteoarthritis involving multiple joints 11/11/2014  . H/O hysterectomy for benign disease 10/20/2014  . History of endometriosis 10/16/2014  . Low back derangement syndrome 09/30/2014  . Degenerative joint disease of both lower legs 09/30/2014  . Bipolar disorder ([place]) 09/30/2014    Past Medical History  Diagnosis Date  . High cholesterol   . GERD (gastroesophageal reflux disease)   . Anxiety   . Depression   . OCD (obsessive compulsive disorder)   . Chronic back pain   . Chronic constipation   . Severe obesity ([place])   . AR (allergic rhinitis)   . Trochanteric bursitis of right hip   . Tobacco use   . Hepatomegaly   . Dyslipidemia   . COPD (chronic obstructive pulmonary disease) ([place])   . Decreased dorsalis pedis pulse   . Fatty liver   . Chronic insomnia   . Benign hypertension   . Deaf   .  Hot flashes   . Migraine with aura   . Arthritis      Transfusion: none   Consultants (if any):    Discharged Condition: Improved  Hospital Course: GENEVY JULIANA is an 50 y.o. female who was admitted 05/12/2015 with a diagnosis of right hip OA and went to the operating room on 05/12/2015 and underwent the above named procedures.    Surgeries: Procedure(s): TOTAL HIP ARTHROPLASTY ANTERIOR APPROACH on 05/12/2015 Patient tolerated the surgery well. Taken to PACU where she was stabilized and then transferred to the orthopedic floor.  Started on Lovenox 40 q 24 hrs. Foot pumps applied bilaterally at 80 mm. Heels elevated on bed with rolled towels. No evidence of DVT. Negative Homan. Physical therapy started on day #1 for gait training and transfer. OT started day #1 for ADL and assisted devices.  Patient's foley was d/c on day #1. Patient's IV and hemovac was d/c on day #2.  On post op day #2 patient was stable and ready for discharge to home with home health PT.  Implants: Medacta, AMIS 4 STEM with 52 mpact cup DM with liner and S 28 mm head  She was given perioperative antibiotics:  Anti-infectives    Start     Dose/Rate Route Frequency Ordered Stop   05/12/15 1730  ceFAZolin (ANCEF) IVPB 2 g/50 mL premix     2 g 100 mL/hr over 30 Minutes Intravenous Every 6 hours 05/12/15 1452 05/13/15 0514   05/12/15 0756  ceFAZolin (ANCEF) 2-3 GM-% IVPB SOLR    Comments:  Hallaji, Violet Ann: cabinet  override      05/12/15 0756 05/12/15 1959   05/12/15 0100  ceFAZolin (ANCEF) IVPB 2 g/50 mL premix     2 g 100 mL/hr over 30 Minutes Intravenous  Once 05/12/15 0045 05/12/15 1148    .  She was given sequential compression devices, early ambulation, and lovenox for DVT prophylaxis.  She benefited maximally from the hospital stay and there were no complications.    Recent vital signs:  Filed Vitals:   05/14/15 0414 05/14/15 0758  BP:  111/79  Pulse: 121 113  Temp:  99.6 F (37.6 C)  Resp:   20    Recent laboratory studies:  Lab Results  Component Value Date   HGB 10.6* 05/13/2015   HGB 12.0 05/12/2015   HGB 13.2 05/05/2015   Lab Results  Component Value Date   WBC 17.2* 05/12/2015   PLT 206 05/12/2015   Lab Results  Component Value Date   INR 0.93 05/05/2015   Lab Results  Component Value Date   NA 140 05/05/2015   K 3.7 05/12/2015   CL 106 05/05/2015   CO2 26 05/05/2015   BUN 13 05/05/2015   CREATININE 0.56 05/12/2015   GLUCOSE 155* 05/05/2015    Discharge Medications:     Medication List    STOP taking these medications        etodolac 500 MG tablet  Commonly known as:  LODINE     ibuprofen 800 MG tablet  Commonly known as:  ADVIL,MOTRIN      TAKE these medications        amphetamine-dextroamphetamine 30 MG tablet  Commonly known as:  ADDERALL  Take 30 mg by mouth 2 (two) times daily.     ARIPiprazole 15 MG tablet  Commonly known as:  ABILIFY  Take 15 mg by mouth daily.     busPIRone 10 MG tablet  Commonly known as:  BUSPAR  Take 20 mg by mouth 2 (two) times daily.     clonazePAM 1 MG tablet  Commonly known as:  KLONOPIN  Take 1-2 mg by mouth 3 (three) times daily as needed for anxiety.     dexlansoprazole 60 MG capsule  Commonly known as:  DEXILANT  Take 1 capsule (60 mg total) by mouth daily.     enoxaparin 40 MG/0.4ML injection  Commonly known as:  LOVENOX  Inject 0.4 mLs (40 mg total) into the skin daily.     Oxycodone HCl 10 MG Tabs  Take 1 tablet (10 mg total) by mouth 3 (three) times daily before meals.     oxyCODONE 5 MG immediate release tablet  Commonly known as:  Oxy IR/ROXICODONE  Take 1-2 tablets (5-10 mg total) by mouth every 4 (four) hours as needed for severe pain or breakthrough pain.     sertraline 50 MG tablet  Commonly known as:  ZOLOFT  Take 100 mg by mouth 2 (two) times daily.     tiotropium 18 MCG inhalation capsule  Commonly known as:  SPIRIVA  Place 1 capsule (18 mcg total) into inhaler and  inhale daily.     tiZANidine 4 MG tablet  Commonly known as:  ZANAFLEX  Take 1 tablet (4 mg total) by mouth every 6 (six) hours as needed for muscle spasms.     traZODone 150 MG tablet  Commonly known as:  DESYREL  Take 300 mg by mouth at bedtime as needed for sleep.     Umeclidinium Bromide 62.5 MCG/INH Aepb  Commonly known as:  INCRUSE ELLIPTA  Inhale 1 puff into the lungs daily.        Diagnostic Studies: Dg Cervical Spine 2-3vclearing  04/23/2015  CLINICAL DATA:  Bilateral back pain radiating to the base of the skull for the past 3 weeks without known injury; tingling in the left hand. EXAM: LIMITED CERVICAL SPINE FOR TRAUMA CLEARING - 2-3 VIEW COMPARISON:  Cervical spine series of April 14, 2011. FINDINGS: The cervical vertebral bodies are preserved in height. The disc space heights are well maintained. There is no perched facet. There is mild facet joint hypertrophy in the mid and lower cervical spine. The spinous processes are intact. The odontoid is intact. There is mild degenerative change of the LN toe dens articulation. The prevertebral soft tissue spaces are normal. IMPRESSION: There are very mild degenerative changes centered on the allow antero dens articulation and along the mid and lower facet joints. There is no compression fracture nor disc space narrowing. Electronically Signed   By: David  Martinique M.D.   On: 04/23/2015 14:34   Dg Hip Operative Unilat With Pelvis Right  05/12/2015  CLINICAL DATA:  Portable operative imaging during right hip arthroplasty. EXAM: OPERATIVE RIGHT HIP (WITH PELVIS IF PERFORMED) 3 VIEWS TECHNIQUE: Fluoroscopic spot image(s) were submitted for interpretation post-operatively. COMPARISON:  None. FINDINGS: Submitted images show placement of the femoral acetabular components of a total hip arthroplasty. Components appear well seated and aligned. There is no acute fracture or evidence of an operative complication. IMPRESSION: Well-aligned right hip  prosthesis. Electronically Signed   By: Lajean Manes M.D.   On: 05/12/2015 13:41   Dg Hip Unilat W Or W/o Pelvis 2-3 Views Right  05/12/2015  CLINICAL DATA:  Right hip replacement EXAM: DG HIP (WITH OR WITHOUT PELVIS) 2-3V RIGHT COMPARISON:  None. FINDINGS: Changes of right hip replacement. Normal alignment. No hardware or bony complicating feature. Soft tissue drain in place. IMPRESSION: Right hip replacement.  No visible complicating feature. Electronically Signed   By: Rolm Baptise M.D.   On: 05/12/2015 14:20    Disposition: 01-Home or Self Care      Discharge Instructions    Suggamadex Discharge Instructions    Complete by:  As directed   During your recent anesthetic, you were given the medication sugammadex ([place]). This medication interacts with hormonal forms of birth control (oral contraceptives and injected or implanted birth control) and may make them ineffective. IF YOU USE ANY HORMONAL FORM OF BIRTH CONTROL, YOU MUST USE AN ADDITIONAL BARRIER BIRTH CONTROL METHOD FOR SEVEN DAYS after receiving sugammadex ([place]) or there is a chance you could become pregnant.           Follow-up Information    Follow up with MENZ,MICHAEL, MD In 2 weeks.   Specialty:  Orthopedic Surgery   Why:  For staple removal and skin check   Contact information:   [place] [address] [phone]        Signed: Feliberto Gottron 05/14/2015, 9:16 AM

## 2015-05-14 NOTE — Progress Notes (Signed)
Spoke with [place], [place] -regarding chest x-ray finding of early infiltrate or right base atelectasis. Gerald Stabs to place new order for antibiotic.

## 2015-05-14 NOTE — Progress Notes (Signed)
Physical Therapy Treatment Patient Details Name: Wendy Johnson MRN: [id number] DOB: April 08, 1966 Today's Date: 05/14/2015    History of Present Illness Pt admitted for R THA. Pt with history of L THA in Aug 2016.     PT Comments    Pt has met all therapy goals and is ready for dc. Pt with safe ambulation on room air with sats WNL. Sats at rest on room air at 90%. Safe technique using rw with no LOB or SOB symptoms noted. Pt reports she has no concerns going home.   Follow Up Recommendations  Home health PT     Equipment Recommendations       Recommendations for Other Services       Precautions / Restrictions Precautions Precautions: Anterior Hip Precaution Booklet Issued: Yes (comment) Restrictions Weight Bearing Restrictions: Yes RLE Weight Bearing: Weight bearing as tolerated    Mobility  Bed Mobility Overal bed mobility: Modified Independent Bed Mobility: Supine to Sit     Supine to sit: Modified independent (Device/Increase time)     General bed mobility comments: not performed as pt in bathroom upon arrival  Transfers Overall transfer level: Modified independent Equipment used: Rolling walker (2 wheeled) Transfers: Sit to/from Stand Sit to Stand: Supervision         General transfer comment: safe technique with rw used.  Ambulation/Gait Ambulation/Gait assistance: Supervision Ambulation Distance (Feet): 200 Feet Assistive device: Rolling walker (2 wheeled) Gait Pattern/deviations: Step-through pattern     General Gait Details: ambulation performed on room air as well with 2L of O2. Sats on room air at 94% and improved when on O2 to 96%.   Stairs Stairs: Yes Stairs assistance: Min guard Stair Management: No rails;With walker Number of Stairs: 3 General stair comments: Therapist demonstrated technique prior to performance. Pt able to demonstrate safe performance of stair training with + 2 assist for equipment management. STep to gait pattern  performed with cues for sequencing.   Wheelchair Mobility    Modified Rankin (Stroke Patients Only)       Balance                                    Cognition Arousal/Alertness: Awake/alert Behavior During Therapy: WFL for tasks assessed/performed Overall Cognitive Status: Within Functional Limits for tasks assessed                      Exercises Other Exercises Other Exercises: Pt used BSC with cga for on/off toliet and supervision for clean up. 1 cue for propping out LE prior to sitting down for comfort.    General Comments        Pertinent Vitals/Pain Pain Assessment: Faces Pain Score: 0-No pain Faces Pain Scale: Hurts little more Pain Location: R hip Pain Descriptors / Indicators: Operative site guarding Pain Intervention(s): Limited activity within patient's tolerance    Home Living                      Prior Function            PT Goals (current goals can now be found in the care plan section) Acute Rehab PT Goals Patient Stated Goal: to go home PT Goal Formulation: With patient Time For Goal Achievement: 05/27/15 Potential to Achieve Goals: Good Progress towards PT goals: Progressing toward goals    Frequency  BID    PT Plan Current plan  remains appropriate    Co-evaluation             End of Session Equipment Utilized During Treatment: Gait belt;Oxygen Activity Tolerance: Patient tolerated treatment well Patient left: in chair;with chair alarm set     Time: 3790-2409 PT Time Calculation (min) (ACUTE ONLY): 12 min  Charges:  $Gait Training: 8-22 mins $Therapeutic Activity: 8-22 mins                    G Codes:      Wendy Johnson 2015/05/26, 2:58 PM

## 2015-05-14 NOTE — Progress Notes (Signed)
Orderly here to take patient for chest x-ray.

## 2015-05-14 NOTE — Progress Notes (Signed)
rn spoke with chris gaines ,[place]  Re o2 sat down to 70s with  Physical tx. . md reports order chest xray  And decrease po pain meds.. Pt with lethargy

## 2015-05-14 NOTE — Care Management (Signed)
Plan for patient to be discharged today with home health.  Referral has been made to [facility]. Corene Cornea notified of pending discharge.  PT currently ambulation patient and saturations are maintaining on RA.  If needed patient already has a portable O2 tank in the home that can be brought at the time of discharge.  RNCM signing off

## 2015-05-15 DIAGNOSIS — Z9981 Dependence on supplemental oxygen: Secondary | ICD-10-CM | POA: Diagnosis not present

## 2015-05-15 DIAGNOSIS — G4733 Obstructive sleep apnea (adult) (pediatric): Secondary | ICD-10-CM | POA: Diagnosis not present

## 2015-05-15 DIAGNOSIS — Z96642 Presence of left artificial hip joint: Secondary | ICD-10-CM | POA: Diagnosis not present

## 2015-05-15 DIAGNOSIS — Z7901 Long term (current) use of anticoagulants: Secondary | ICD-10-CM | POA: Diagnosis not present

## 2015-05-15 DIAGNOSIS — E119 Type 2 diabetes mellitus without complications: Secondary | ICD-10-CM | POA: Diagnosis not present

## 2015-05-15 DIAGNOSIS — I10 Essential (primary) hypertension: Secondary | ICD-10-CM | POA: Diagnosis not present

## 2015-05-15 DIAGNOSIS — F17210 Nicotine dependence, cigarettes, uncomplicated: Secondary | ICD-10-CM | POA: Diagnosis not present

## 2015-05-15 DIAGNOSIS — F319 Bipolar disorder, unspecified: Secondary | ICD-10-CM | POA: Diagnosis not present

## 2015-05-15 DIAGNOSIS — Z96641 Presence of right artificial hip joint: Secondary | ICD-10-CM | POA: Diagnosis not present

## 2015-05-15 DIAGNOSIS — Z471 Aftercare following joint replacement surgery: Secondary | ICD-10-CM | POA: Diagnosis not present

## 2015-05-15 DIAGNOSIS — G43909 Migraine, unspecified, not intractable, without status migrainosus: Secondary | ICD-10-CM | POA: Diagnosis not present

## 2015-05-15 DIAGNOSIS — E785 Hyperlipidemia, unspecified: Secondary | ICD-10-CM | POA: Diagnosis not present

## 2015-05-15 DIAGNOSIS — F419 Anxiety disorder, unspecified: Secondary | ICD-10-CM | POA: Diagnosis not present

## 2015-05-15 DIAGNOSIS — J449 Chronic obstructive pulmonary disease, unspecified: Secondary | ICD-10-CM | POA: Diagnosis not present

## 2015-05-18 ENCOUNTER — Telehealth: Payer: Self-pay | Admitting: Anesthesiology

## 2015-05-18 DIAGNOSIS — I10 Essential (primary) hypertension: Secondary | ICD-10-CM | POA: Diagnosis not present

## 2015-05-18 DIAGNOSIS — J449 Chronic obstructive pulmonary disease, unspecified: Secondary | ICD-10-CM | POA: Diagnosis not present

## 2015-05-18 DIAGNOSIS — F319 Bipolar disorder, unspecified: Secondary | ICD-10-CM | POA: Diagnosis not present

## 2015-05-18 DIAGNOSIS — E119 Type 2 diabetes mellitus without complications: Secondary | ICD-10-CM | POA: Diagnosis not present

## 2015-05-18 DIAGNOSIS — Z471 Aftercare following joint replacement surgery: Secondary | ICD-10-CM | POA: Diagnosis not present

## 2015-05-18 DIAGNOSIS — F419 Anxiety disorder, unspecified: Secondary | ICD-10-CM | POA: Diagnosis not present

## 2015-05-18 NOTE — Telephone Encounter (Signed)
Per Dr. Andree Elk instructions - Dr. Rudene Christians gave patient 33 percocet for pain / was approved by Dr. Andree Elk / please not in patients chart

## 2015-05-20 DIAGNOSIS — I10 Essential (primary) hypertension: Secondary | ICD-10-CM | POA: Diagnosis not present

## 2015-05-20 DIAGNOSIS — Z471 Aftercare following joint replacement surgery: Secondary | ICD-10-CM | POA: Diagnosis not present

## 2015-05-20 DIAGNOSIS — E119 Type 2 diabetes mellitus without complications: Secondary | ICD-10-CM | POA: Diagnosis not present

## 2015-05-20 DIAGNOSIS — J449 Chronic obstructive pulmonary disease, unspecified: Secondary | ICD-10-CM | POA: Diagnosis not present

## 2015-05-20 DIAGNOSIS — F319 Bipolar disorder, unspecified: Secondary | ICD-10-CM | POA: Diagnosis not present

## 2015-05-20 DIAGNOSIS — F419 Anxiety disorder, unspecified: Secondary | ICD-10-CM | POA: Diagnosis not present

## 2015-05-21 NOTE — Anesthesia Postprocedure Evaluation (Signed)
Anesthesia Post Note  Patient: Wendy Johnson  Procedure(s) Performed: Procedure(s) (LRB): TOTAL HIP ARTHROPLASTY ANTERIOR APPROACH (Right)  Patient location during evaluation: PACU Anesthesia Type: General Level of consciousness: awake and alert Pain management: pain level controlled Vital Signs Assessment: post-procedure vital signs reviewed and stable Respiratory status: spontaneous breathing, nonlabored ventilation, respiratory function stable and patient connected to nasal cannula oxygen Cardiovascular status: blood pressure returned to baseline and stable Postop Assessment: no signs of nausea or vomiting Anesthetic complications: no    Last Vitals:  Filed Vitals:   05/14/15 1101 05/14/15 1537  BP:  137/86  Pulse: 100 93  Temp: 36.8 C 36.9 C  Resp:  18    Last Pain:  Filed Vitals:   05/14/15 1537  PainSc: 3                  Molli Barrows

## 2015-05-25 DIAGNOSIS — E119 Type 2 diabetes mellitus without complications: Secondary | ICD-10-CM | POA: Diagnosis not present

## 2015-05-25 DIAGNOSIS — Z471 Aftercare following joint replacement surgery: Secondary | ICD-10-CM | POA: Diagnosis not present

## 2015-05-25 DIAGNOSIS — F319 Bipolar disorder, unspecified: Secondary | ICD-10-CM | POA: Diagnosis not present

## 2015-05-25 DIAGNOSIS — F419 Anxiety disorder, unspecified: Secondary | ICD-10-CM | POA: Diagnosis not present

## 2015-05-25 DIAGNOSIS — I10 Essential (primary) hypertension: Secondary | ICD-10-CM | POA: Diagnosis not present

## 2015-05-25 DIAGNOSIS — J449 Chronic obstructive pulmonary disease, unspecified: Secondary | ICD-10-CM | POA: Diagnosis not present

## 2015-05-27 ENCOUNTER — Ambulatory Visit: Payer: Medicare Other | Attending: Anesthesiology | Admitting: Anesthesiology

## 2015-05-27 ENCOUNTER — Encounter: Payer: Self-pay | Admitting: Anesthesiology

## 2015-05-27 VITALS — BP 122/68 | HR 76 | Temp 98.1°F | Resp 16 | Ht 64.0 in | Wt 210.0 lb

## 2015-05-27 DIAGNOSIS — M545 Low back pain, unspecified: Secondary | ICD-10-CM

## 2015-05-27 DIAGNOSIS — Z96651 Presence of right artificial knee joint: Secondary | ICD-10-CM | POA: Insufficient documentation

## 2015-05-27 DIAGNOSIS — H9190 Unspecified hearing loss, unspecified ear: Secondary | ICD-10-CM | POA: Diagnosis not present

## 2015-05-27 DIAGNOSIS — M160 Bilateral primary osteoarthritis of hip: Secondary | ICD-10-CM | POA: Diagnosis not present

## 2015-05-27 DIAGNOSIS — G43009 Migraine without aura, not intractable, without status migrainosus: Secondary | ICD-10-CM | POA: Diagnosis not present

## 2015-05-27 DIAGNOSIS — M4692 Unspecified inflammatory spondylopathy, cervical region: Secondary | ICD-10-CM | POA: Diagnosis not present

## 2015-05-27 DIAGNOSIS — F1190 Opioid use, unspecified, uncomplicated: Secondary | ICD-10-CM | POA: Diagnosis not present

## 2015-05-27 DIAGNOSIS — M791 Myalgia: Secondary | ICD-10-CM | POA: Diagnosis not present

## 2015-05-27 DIAGNOSIS — G4730 Sleep apnea, unspecified: Secondary | ICD-10-CM | POA: Diagnosis not present

## 2015-05-27 DIAGNOSIS — G8929 Other chronic pain: Secondary | ICD-10-CM | POA: Insufficient documentation

## 2015-05-27 DIAGNOSIS — F411 Generalized anxiety disorder: Secondary | ICD-10-CM

## 2015-05-27 DIAGNOSIS — K219 Gastro-esophageal reflux disease without esophagitis: Secondary | ICD-10-CM | POA: Insufficient documentation

## 2015-05-27 DIAGNOSIS — M150 Primary generalized (osteo)arthritis: Secondary | ICD-10-CM

## 2015-05-27 DIAGNOSIS — J449 Chronic obstructive pulmonary disease, unspecified: Secondary | ICD-10-CM | POA: Insufficient documentation

## 2015-05-27 DIAGNOSIS — M1612 Unilateral primary osteoarthritis, left hip: Secondary | ICD-10-CM | POA: Insufficient documentation

## 2015-05-27 DIAGNOSIS — M1712 Unilateral primary osteoarthritis, left knee: Secondary | ICD-10-CM

## 2015-05-27 DIAGNOSIS — M47812 Spondylosis without myelopathy or radiculopathy, cervical region: Secondary | ICD-10-CM

## 2015-05-27 DIAGNOSIS — M47816 Spondylosis without myelopathy or radiculopathy, lumbar region: Secondary | ICD-10-CM

## 2015-05-27 DIAGNOSIS — M179 Osteoarthritis of knee, unspecified: Secondary | ICD-10-CM

## 2015-05-27 DIAGNOSIS — Z96642 Presence of left artificial hip joint: Secondary | ICD-10-CM | POA: Insufficient documentation

## 2015-05-27 DIAGNOSIS — M8949 Other hypertrophic osteoarthropathy, multiple sites: Secondary | ICD-10-CM

## 2015-05-27 DIAGNOSIS — E119 Type 2 diabetes mellitus without complications: Secondary | ICD-10-CM | POA: Insufficient documentation

## 2015-05-27 DIAGNOSIS — M5136 Other intervertebral disc degeneration, lumbar region: Secondary | ICD-10-CM | POA: Insufficient documentation

## 2015-05-27 DIAGNOSIS — F319 Bipolar disorder, unspecified: Secondary | ICD-10-CM | POA: Insufficient documentation

## 2015-05-27 DIAGNOSIS — E785 Hyperlipidemia, unspecified: Secondary | ICD-10-CM | POA: Insufficient documentation

## 2015-05-27 DIAGNOSIS — M5386 Other specified dorsopathies, lumbar region: Secondary | ICD-10-CM

## 2015-05-27 DIAGNOSIS — G8918 Other acute postprocedural pain: Secondary | ICD-10-CM | POA: Diagnosis not present

## 2015-05-27 DIAGNOSIS — M51369 Other intervertebral disc degeneration, lumbar region without mention of lumbar back pain or lower extremity pain: Secondary | ICD-10-CM

## 2015-05-27 DIAGNOSIS — M170 Bilateral primary osteoarthritis of knee: Secondary | ICD-10-CM | POA: Diagnosis not present

## 2015-05-27 DIAGNOSIS — M542 Cervicalgia: Secondary | ICD-10-CM

## 2015-05-27 DIAGNOSIS — F418 Other specified anxiety disorders: Secondary | ICD-10-CM | POA: Diagnosis not present

## 2015-05-27 DIAGNOSIS — K5909 Other constipation: Secondary | ICD-10-CM | POA: Insufficient documentation

## 2015-05-27 DIAGNOSIS — M1711 Unilateral primary osteoarthritis, right knee: Secondary | ICD-10-CM

## 2015-05-27 DIAGNOSIS — I10 Essential (primary) hypertension: Secondary | ICD-10-CM | POA: Diagnosis not present

## 2015-05-27 DIAGNOSIS — Z842 Family history of other diseases of the genitourinary system: Secondary | ICD-10-CM

## 2015-05-27 DIAGNOSIS — M159 Polyosteoarthritis, unspecified: Secondary | ICD-10-CM

## 2015-05-27 DIAGNOSIS — Z9981 Dependence on supplemental oxygen: Secondary | ICD-10-CM | POA: Diagnosis not present

## 2015-05-27 MED ORDER — OXYCODONE HCL 10 MG PO TABS: 10.0000 mg | ORAL_TABLET | Freq: Three times a day (TID) | ORAL | Status: DC

## 2015-05-27 NOTE — Patient Instructions (Signed)
You were given 2 prescriptions for Oxycodone today. 

## 2015-05-27 NOTE — Progress Notes (Signed)
Safety precautions to be maintained throughout the outpatient stay will include: orient to surroundings, keep bed in low position, maintain call bell within reach at all times, provide assistance with transfer out of bed and ambulation.  

## 2015-05-29 ENCOUNTER — Ambulatory Visit: Payer: Medicare Other | Admitting: Family Medicine

## 2015-05-29 DIAGNOSIS — I10 Essential (primary) hypertension: Secondary | ICD-10-CM | POA: Diagnosis not present

## 2015-05-29 DIAGNOSIS — F319 Bipolar disorder, unspecified: Secondary | ICD-10-CM | POA: Diagnosis not present

## 2015-05-29 DIAGNOSIS — F419 Anxiety disorder, unspecified: Secondary | ICD-10-CM | POA: Diagnosis not present

## 2015-05-29 DIAGNOSIS — Z471 Aftercare following joint replacement surgery: Secondary | ICD-10-CM | POA: Diagnosis not present

## 2015-05-29 DIAGNOSIS — J449 Chronic obstructive pulmonary disease, unspecified: Secondary | ICD-10-CM | POA: Diagnosis not present

## 2015-05-29 DIAGNOSIS — E119 Type 2 diabetes mellitus without complications: Secondary | ICD-10-CM | POA: Diagnosis not present

## 2015-05-29 NOTE — Progress Notes (Signed)
Subjective:    Patient ID: Wendy Johnson, female    DOB: 04-17-1966, 50 y.o.   MRN: [id number]    Chief complaint: Chronic low back pain. Chronic bilateral lower extremity pain and diffuse body pain  HPI: Wendy Johnson presents today for reevaluation. She was recently seen here in the pain control Center and now status post several surgeries. She had a right hip replaced just a few weeks ago with a right knee replacement several months ago and a left hip replacement 4 months ago. She continues to have pain in the bilateral hips which is mainly post surgical but improving followed by Dr. Youlanda Mighty. Her knee pain is at baseline. She still has some chronic low back pain and neck pain. She has been doing some physical therapy as well and continues to take her medications as prescribed. She denies any diverting or illicit use. She has slowly been weaning off of her acute February doses and back to her chronic baseline opioid doses for her chronic diffuse body pain.   BP 122/68 mmHg  Pulse 76  Temp(Src) 98.1 F (36.7 C) (Oral)  Resp 16  Ht 5\' 4"  (1.626 m)  Wt 210 lb (95.255 kg)  BMI 36.03 kg/m2  SpO2 99%  LMP 09/26/2014    Current outpatient prescriptions:  .  amphetamine-dextroamphetamine (ADDERALL) 30 MG tablet, Take 30 mg by mouth 2 (two) times daily., Disp: , Rfl:  .  ARIPiprazole (ABILIFY) 15 MG tablet, Take 15 mg by mouth daily., Disp: , Rfl:  .  busPIRone (BUSPAR) 10 MG tablet, Take 20 mg by mouth 2 (two) times daily. , Disp: , Rfl:  .  clonazePAM (KLONOPIN) 1 MG tablet, Take 1-2 mg by mouth 3 (three) times daily as needed for anxiety., Disp: , Rfl:  .  dexlansoprazole (DEXILANT) 60 MG capsule, Take 1 capsule (60 mg total) by mouth daily., Disp: 30 capsule, Rfl: 5 .  enoxaparin (LOVENOX) 40 MG/0.4ML injection, Inject 0.4 mLs (40 mg total) into the skin daily., Disp: 14 Syringe, Rfl: 0 .  Oxycodone HCl 10 MG TABS, Take 1 tablet (10 mg total) by mouth 3 (three) times daily before meals., Disp: 90  tablet, Rfl: 0 .  sertraline (ZOLOFT) 50 MG tablet, Take 100 mg by mouth 2 (two) times daily. , Disp: , Rfl:  .  tiotropium (SPIRIVA) 18 MCG inhalation capsule, Place 1 capsule (18 mcg total) into inhaler and inhale daily., Disp: 30 capsule, Rfl: 0 .  tiZANidine (ZANAFLEX) 4 MG tablet, Take 1 tablet (4 mg total) by mouth every 6 (six) hours as needed for muscle spasms., Disp: 60 tablet, Rfl: 2 .  traZODone (DESYREL) 150 MG tablet, Take 300 mg by mouth at bedtime as needed for sleep. , Disp: , Rfl:  .  levofloxacin (LEVAQUIN) 750 MG tablet, Take 1 tablet (750 mg total) by mouth daily. (Patient not taking: Reported on 05/27/2015), Disp: 6 tablet, Rfl: 0 .  Umeclidinium Bromide (INCRUSE ELLIPTA) 62.5 MCG/INH AEPB, Inhale 1 puff into the lungs daily. (Patient not taking: Reported on 05/27/2015), Disp: 30 each, Rfl: 5   Patient Active Problem List   Diagnosis Date Noted  . Primary osteoarthritis of right hip 05/12/2015  . Sleep apnea 04/07/2015  . Primary osteoarthritis of left hip 12/16/2014  . Metabolic syndrome [id number]  . Migraine without aura and without status migrainosus, not intractable 11/26/2014  . GERD without esophagitis 11/26/2014  . COPD, moderate ([place]) 11/26/2014  . Nocturnal oxygen desaturation 11/26/2014  . Supplemental oxygen dependent 11/26/2014  .  Hearing loss 11/26/2014  . Chronic insomnia 11/26/2014  . History of hypertension 11/26/2014  . Dyslipidemia 11/26/2014  . History of morbid obesity 11/26/2014  . History of diabetes mellitus, type II 11/26/2014  . Chronic constipation 11/26/2014  . Generalized anxiety disorder 11/11/2014  . DDD (degenerative disc disease), lumbar 11/11/2014  . Facet arthritis of lumbar region 11/11/2014  . Primary osteoarthritis involving multiple joints 11/11/2014  . H/O hysterectomy for benign disease 10/20/2014  . History of endometriosis 10/16/2014  . Low back derangement syndrome 09/30/2014  . Degenerative joint disease of both lower  legs 09/30/2014  . Bipolar disorder ([place]) 09/30/2014     Review of Systems  Review of systems negative for cardiovascular negative for pulmonary she snores if her neurologic positive for anxiety depression panic attacks and insomnia she also has bipolar disease as CD with a history of reflux negative for GU negative for hematologic negative for endocrine negative for rheumatologic  Social history is positive for being divorced with one child she smokes pack cigarettes per day for doing this for 12 years  Currently disabled since 2006.  Family history is positive for alcoholism diabetes and high blood pressure     Objective:   Physical Exam  Patient is 50 year old white female in no acute distress is alert and oriented 3 and compliant with examination. Her heart is regular rate and rhythm.     Assessment & Plan:  #1. Chronic low back pain with low back syndrome and degenerative disc disease  #2. Diffuse body pain with osteoarthritis  #3. Degenerative joint disease with severe left hip pain and right hip pain  #4. History of bipolar disorder  #5. History of anxiety depression on chronic opioid management  Plan: I will continue with current medication regimen with refills given for today and next month. She is to continue follow-up with Dr. Rudene Christians following her surgeries. We'll continue to follow along with her regarding her pain management medications. We will refill these for the next 2 months with return to clinic in approximately 2 months. Dr. Vashti Hey

## 2015-06-02 DIAGNOSIS — I10 Essential (primary) hypertension: Secondary | ICD-10-CM | POA: Diagnosis not present

## 2015-06-02 DIAGNOSIS — J449 Chronic obstructive pulmonary disease, unspecified: Secondary | ICD-10-CM | POA: Diagnosis not present

## 2015-06-02 DIAGNOSIS — E119 Type 2 diabetes mellitus without complications: Secondary | ICD-10-CM | POA: Diagnosis not present

## 2015-06-02 DIAGNOSIS — F419 Anxiety disorder, unspecified: Secondary | ICD-10-CM | POA: Diagnosis not present

## 2015-06-02 DIAGNOSIS — F319 Bipolar disorder, unspecified: Secondary | ICD-10-CM | POA: Diagnosis not present

## 2015-06-02 DIAGNOSIS — Z471 Aftercare following joint replacement surgery: Secondary | ICD-10-CM | POA: Diagnosis not present

## 2015-06-08 DIAGNOSIS — I10 Essential (primary) hypertension: Secondary | ICD-10-CM | POA: Diagnosis not present

## 2015-06-08 DIAGNOSIS — Z471 Aftercare following joint replacement surgery: Secondary | ICD-10-CM | POA: Diagnosis not present

## 2015-06-08 DIAGNOSIS — J449 Chronic obstructive pulmonary disease, unspecified: Secondary | ICD-10-CM | POA: Diagnosis not present

## 2015-06-08 DIAGNOSIS — F419 Anxiety disorder, unspecified: Secondary | ICD-10-CM | POA: Diagnosis not present

## 2015-06-08 DIAGNOSIS — F319 Bipolar disorder, unspecified: Secondary | ICD-10-CM | POA: Diagnosis not present

## 2015-06-08 DIAGNOSIS — E119 Type 2 diabetes mellitus without complications: Secondary | ICD-10-CM | POA: Diagnosis not present

## 2015-06-11 DIAGNOSIS — I10 Essential (primary) hypertension: Secondary | ICD-10-CM | POA: Diagnosis not present

## 2015-06-11 DIAGNOSIS — F319 Bipolar disorder, unspecified: Secondary | ICD-10-CM | POA: Diagnosis not present

## 2015-06-11 DIAGNOSIS — E119 Type 2 diabetes mellitus without complications: Secondary | ICD-10-CM | POA: Diagnosis not present

## 2015-06-11 DIAGNOSIS — J449 Chronic obstructive pulmonary disease, unspecified: Secondary | ICD-10-CM | POA: Diagnosis not present

## 2015-06-11 DIAGNOSIS — Z471 Aftercare following joint replacement surgery: Secondary | ICD-10-CM | POA: Diagnosis not present

## 2015-06-11 DIAGNOSIS — F419 Anxiety disorder, unspecified: Secondary | ICD-10-CM | POA: Diagnosis not present

## 2015-06-22 DIAGNOSIS — F4310 Post-traumatic stress disorder, unspecified: Secondary | ICD-10-CM | POA: Diagnosis not present

## 2015-06-22 DIAGNOSIS — F3189 Other bipolar disorder: Secondary | ICD-10-CM | POA: Diagnosis not present

## 2015-06-22 DIAGNOSIS — F902 Attention-deficit hyperactivity disorder, combined type: Secondary | ICD-10-CM | POA: Diagnosis not present

## 2015-06-22 DIAGNOSIS — F901 Attention-deficit hyperactivity disorder, predominantly hyperactive type: Secondary | ICD-10-CM | POA: Diagnosis not present

## 2015-06-22 DIAGNOSIS — Z79899 Other long term (current) drug therapy: Secondary | ICD-10-CM | POA: Diagnosis not present

## 2015-06-22 DIAGNOSIS — F410 Panic disorder [episodic paroxysmal anxiety] without agoraphobia: Secondary | ICD-10-CM | POA: Diagnosis not present

## 2015-06-24 DIAGNOSIS — Z96641 Presence of right artificial hip joint: Secondary | ICD-10-CM | POA: Diagnosis not present

## 2015-06-24 DIAGNOSIS — M25561 Pain in right knee: Secondary | ICD-10-CM | POA: Diagnosis not present

## 2015-07-08 ENCOUNTER — Ambulatory Visit (INDEPENDENT_AMBULATORY_CARE_PROVIDER_SITE_OTHER): Payer: Medicare Other | Admitting: Family Medicine

## 2015-07-08 ENCOUNTER — Encounter: Payer: Self-pay | Admitting: Family Medicine

## 2015-07-08 VITALS — BP 118/70 | HR 95 | Temp 98.0°F | Resp 18 | Wt 229.3 lb

## 2015-07-08 DIAGNOSIS — K219 Gastro-esophageal reflux disease without esophagitis: Secondary | ICD-10-CM | POA: Diagnosis not present

## 2015-07-08 DIAGNOSIS — G4730 Sleep apnea, unspecified: Secondary | ICD-10-CM | POA: Diagnosis not present

## 2015-07-08 DIAGNOSIS — M62838 Other muscle spasm: Secondary | ICD-10-CM

## 2015-07-08 DIAGNOSIS — E785 Hyperlipidemia, unspecified: Secondary | ICD-10-CM | POA: Diagnosis not present

## 2015-07-08 DIAGNOSIS — J449 Chronic obstructive pulmonary disease, unspecified: Secondary | ICD-10-CM | POA: Diagnosis not present

## 2015-07-08 DIAGNOSIS — G43009 Migraine without aura, not intractable, without status migrainosus: Secondary | ICD-10-CM

## 2015-07-08 MED ORDER — DEXLANSOPRAZOLE 60 MG PO CPDR: 60.0000 mg | DELAYED_RELEASE_CAPSULE | Freq: Every day | ORAL | Status: DC

## 2015-07-08 MED ORDER — IPRATROPIUM-ALBUTEROL 20-100 MCG/ACT IN AERS: 1.0000 | INHALATION_SPRAY | Freq: Four times a day (QID) | RESPIRATORY_TRACT | Status: DC

## 2015-07-08 MED ORDER — TIOTROPIUM BROMIDE MONOHYDRATE 18 MCG IN CAPS: 18.0000 ug | ORAL_CAPSULE | Freq: Every day | RESPIRATORY_TRACT | Status: DC

## 2015-07-08 MED ORDER — CARISOPRODOL 350 MG PO TABS: 350.0000 mg | ORAL_TABLET | Freq: Every day | ORAL | Status: DC

## 2015-07-08 MED ORDER — AMITRIPTYLINE HCL 10 MG PO TABS: 10.0000 mg | ORAL_TABLET | Freq: Every day | ORAL | Status: DC

## 2015-07-08 NOTE — Progress Notes (Signed)
Name: Wendy Johnson   MRN: [id number]    DOB: 03/14/1966   Date:07/08/2015       Progress Note  Subjective  Chief Complaint  Chief Complaint  Patient presents with  . Medication Refill    patient recently had her hip replaced  . Referral    Dr. Caryl Comes for plantar warts    HPI  Migraine: she states episodes of migraine have increase in frequency over the past couple of months. She states she had 3 episodes of migraine this past week. She states she has been taking Tylenol without improvement of symptoms.- she can't take Imitrex because it caused chest pain. Migraine is described as frontal throbbing sensation, associated with photophobia, phonophobia, also has nausea and vomiting. Discussed prophylaxis with  Medications, she states Topamax and Gabapentin did not work in the past, she is willing to try Elavil, also discussed prednisone prn for episode of pain.  Plantar warts: she developed two tender spots on plantar left foot. She would like to see Dr. Caryl Comes.   GERD: taking Dexilant, and tries to follow a GERD appropriate diet. She denies current heartburn or regurgitation.   Sleep Apnea: she is not using CPAP machine, she states she is using oxygen and she does not know how to combine the oxygen with CPAP machine.   Chronic pain/muscles spasms: she goes to the pain clinic, sees Dr. Andree Elk, states Tizadinidine does not work for her, would like to go back on Soma, explained that I will give her only 30 pills per month, take it at night.   COPD: taking Spiriva, still smoking, uses oxygen at night. She denies cough, no SOB with activity. No recent flares, only had a couple flares last year.   Bipolar: sees psychiatrist, compliant with medication   Patient Active Problem List   Diagnosis Date Noted  . Primary osteoarthritis of right hip 05/12/2015  . Sleep apnea 04/07/2015  . Primary osteoarthritis of left hip 12/16/2014  . Metabolic syndrome 07/62/2633  . Migraine without aura and  without status migrainosus, not intractable 11/26/2014  . GERD without esophagitis 11/26/2014  . COPD, moderate ([place]) 11/26/2014  . Nocturnal oxygen desaturation 11/26/2014  . Supplemental oxygen dependent 11/26/2014  . Hearing loss 11/26/2014  . Chronic insomnia 11/26/2014  . History of hypertension 11/26/2014  . Dyslipidemia 11/26/2014  . History of morbid obesity 11/26/2014  . History of diabetes mellitus, type II 11/26/2014  . Chronic constipation 11/26/2014  . Generalized anxiety disorder 11/11/2014  . DDD (degenerative disc disease), lumbar 11/11/2014  . Facet arthritis of lumbar region 11/11/2014  . Primary osteoarthritis involving multiple joints 11/11/2014  . H/O hysterectomy for benign disease 10/20/2014  . History of endometriosis 10/16/2014  . Low back derangement syndrome 09/30/2014  . Degenerative joint disease of both lower legs 09/30/2014  . Bipolar disorder ([place]) 09/30/2014    Past Surgical History  Procedure Laterality Date  . Lipoma excision    . Knee arthroscopo Right   . Tonsillectomy    . Tubal ligation    . Laparoscopy  09/22/2014    Procedure: LAPAROSCOPY OPERATIVE;  Surgeon: Brayton Mars, MD;  Location: [facility] ORS;  Service: Gynecology;;  excision and fulgeration of endomertriosis  . Foot surgery Bilateral   . Bone spurs removed Bilateral   . Abdominal hysterectomy N/A 10/20/2014    Procedure: Total abdominial hysterectomy, bilateral salpingo-oophorectomy;  Surgeon: Brayton Mars, MD;  Location: [facility] ORS;  Service: Gynecology;  Laterality: N/A;  . Bilateral salpingoophorectomy    .  Total hip arthroplasty Left 12/16/2014    Procedure: TOTAL HIP ARTHROPLASTY ANTERIOR APPROACH;  Surgeon: Hessie Knows, MD;  Location: [facility] ORS;  Service: Orthopedics;  Laterality: Left;  . Joint replacement Right     knee x 2   . Joint replacement Left   . Total hip arthroplasty Right 05/12/2015    Procedure: TOTAL HIP ARTHROPLASTY ANTERIOR APPROACH;  Surgeon:  Hessie Knows, MD;  Location: [facility] ORS;  Service: Orthopedics;  Laterality: Right;    Family History  Problem Relation Age of Onset  . Diabetes Mother   . Heart disease Father   . Heart attack Father   . Diabetes Sister     Social History   Social History  . Marital Status: Divorced    Spouse Name: N/A  . Number of Children: N/A  . Years of Education: N/A   Occupational History  . Not on file.   Social History Main Topics  . Smoking status: Current Every Day Smoker -- 1.00 packs/day for 35 years    Types: Cigarettes    Start date: 11/26/1979  . Smokeless tobacco: Never Used  . Alcohol Use: No  . Drug Use: No     Comment: quit crack cocaine 2004  . Sexual Activity:    Partners: Male    Patent examiner Protection: None   Other Topics Concern  . Not on file   Social History Narrative     Current outpatient prescriptions:  .  nabumetone (RELAFEN) 750 MG tablet, Take by mouth., Disp: , Rfl:  .  amphetamine-dextroamphetamine (ADDERALL) 30 MG tablet, Take 30 mg by mouth 2 (two) times daily., Disp: , Rfl:  .  ARIPiprazole (ABILIFY) 15 MG tablet, Take 15 mg by mouth daily., Disp: , Rfl:  .  busPIRone (BUSPAR) 10 MG tablet, Take 20 mg by mouth 2 (two) times daily. , Disp: , Rfl:  .  carisoprodol (SOMA) 350 MG tablet, Take 1 tablet (350 mg total) by mouth at bedtime., Disp: 30 tablet, Rfl: 2 .  clonazePAM (KLONOPIN) 1 MG tablet, Take 1-2 mg by mouth 3 (three) times daily as needed for anxiety., Disp: , Rfl:  .  dexlansoprazole (DEXILANT) 60 MG capsule, Take 1 capsule (60 mg total) by mouth daily., Disp: 30 capsule, Rfl: 5 .  Ipratropium-Albuterol (COMBIVENT) 20-100 MCG/ACT AERS respimat, Inhale 1 puff into the lungs every 6 (six) hours., Disp: 4 g, Rfl: 5 .  Oxycodone HCl 10 MG TABS, Take 1 tablet (10 mg total) by mouth 3 (three) times daily before meals., Disp: 90 tablet, Rfl: 0 .  sertraline (ZOLOFT) 50 MG tablet, Take 100 mg by mouth 2 (two) times daily. , Disp: , Rfl:  .   tiotropium (SPIRIVA) 18 MCG inhalation capsule, Place 1 capsule (18 mcg total) into inhaler and inhale daily., Disp: 30 capsule, Rfl: 5 .  traZODone (DESYREL) 150 MG tablet, Take 300 mg by mouth at bedtime as needed for sleep. , Disp: , Rfl:   Allergies  Allergen Reactions  . Codeine Nausea Only  . Imitrex [Sumatriptan] Other (See Comments)    Chest pain     ROS  Constitutional: Negative for fever , positive for weight change.  Respiratory: Negative for cough and shortness of breath.   Cardiovascular: Negative for chest pain or palpitations.  Gastrointestinal: Negative for abdominal pain, no bowel changes.  Musculoskeletal: Positive for gait problem ( recent surgery ) or joint swelling.  Skin: Negative for rash. Plantar wart left foot Neurological: Negative for dizziness or headache.  No  other specific complaints in a complete review of systems (except as listed in HPI above). Objective  Filed Vitals:   07/08/15 0806  BP: 118/70  Pulse: 95  Temp: 98 F (36.7 C)  TempSrc: Oral  Resp: 18  Weight: 229 lb 4.8 oz (104.01 kg)  SpO2: 98%    Body mass index is 39.34 kg/(m^2).  Physical Exam  Constitutional: Patient appears well-developed and well-nourished. Obese  No distress.  HEENT: head atraumatic, normocephalic, pupils equal and reactive to light,  neck supple, throat within normal limits Cardiovascular: Normal rate, regular rhythm and normal heart sounds.  No murmur heard. No BLE edema. Pulmonary/Chest: Effort normal and breath sounds normal. No respiratory distress. Abdominal: Soft.  There is no tenderness. Psychiatric: Patient has a normal mood and affect. behavior is normal. Judgment and thought content normal. Skin: two plantar warts on left foot, good rom of both knees, still has pain with rom of right hip ( s/p replacement 05/2015)  Recent Results (from the past 2160 hour(s))  Urinalysis complete, with microscopic ([facility] only)     Status: Abnormal   Collection Time:  05/05/15 10:35 AM  Result Value Ref Range   Color, Urine COLORLESS (A) YELLOW   APPearance CLEAR (A) CLEAR   Glucose, UA NEGATIVE NEGATIVE mg/dL   Bilirubin Urine NEGATIVE NEGATIVE   Ketones, ur NEGATIVE NEGATIVE mg/dL   Specific Gravity, Urine 1.002 (L) 1.005 - 1.030   Hgb urine dipstick NEGATIVE NEGATIVE   pH 5.0 5.0 - 8.0   Protein, ur NEGATIVE NEGATIVE mg/dL   Nitrite NEGATIVE NEGATIVE   Leukocytes, UA NEGATIVE NEGATIVE   RBC / HPF 0-5 0 - 5 RBC/hpf   WBC, UA 0-5 0 - 5 WBC/hpf   Bacteria, UA NONE SEEN NONE SEEN   Squamous Epithelial / LPF 0-5 (A) NONE SEEN  CBC     Status: Abnormal   Collection Time: 05/05/15 10:36 AM  Result Value Ref Range   WBC 5.6 3.6 - 11.0 K/uL   RBC 4.76 3.80 - 5.20 MIL/uL   Hemoglobin 13.2 12.0 - 16.0 g/dL   HCT 39.8 35.0 - 47.0 %   MCV 83.6 80.0 - 100.0 fL   MCH 27.8 26.0 - 34.0 pg   MCHC 33.3 32.0 - 36.0 g/dL   RDW 16.5 (H) 11.5 - 14.5 %   Platelets 190 150 - 440 K/uL  Sedimentation rate     Status: None   Collection Time: 05/05/15 10:36 AM  Result Value Ref Range   Sed Rate 14 0 - 20 mm/hr  Basic metabolic panel     Status: Abnormal   Collection Time: 05/05/15 10:36 AM  Result Value Ref Range   Sodium 140 135 - 145 mmol/L   Potassium 3.3 (L) 3.5 - 5.1 mmol/L   Chloride 106 101 - 111 mmol/L   CO2 26 22 - 32 mmol/L   Glucose, Bld 155 (H) 65 - 99 mg/dL   BUN 13 6 - 20 mg/dL   Creatinine, Ser 0.48 0.44 - 1.00 mg/dL   Calcium 9.1 8.9 - 10.3 mg/dL   GFR calc non Af Amer >60 >60 mL/min   GFR calc Af Amer >60 >60 mL/min    Comment: (NOTE) The eGFR has been calculated using the CKD EPI equation. This calculation has not been validated in all clinical situations. eGFR's persistently <60 mL/min signify possible Chronic Kidney Disease.    Anion gap 8 5 - 15  APTT     Status: None   Collection Time: 05/05/15  10:36 AM  Result Value Ref Range   aPTT 27 24 - 36 seconds  Urine culture     Status: None   Collection Time: 05/05/15 10:36 AM   Result Value Ref Range   Specimen Description URINE, RANDOM    Special Requests NONE    Culture MULTIPLE SPECIES PRESENT, SUGGEST RECOLLECTION    Report Status 05/06/2015 FINAL   Surgical pcr screen     Status: None   Collection Time: 05/05/15 10:36 AM  Result Value Ref Range   MRSA, PCR NEGATIVE NEGATIVE   Staphylococcus aureus NEGATIVE NEGATIVE  Type and screen [place]     Status: None   Collection Time: 05/05/15 10:36 AM  Result Value Ref Range   ABO/RH(D) A POS    Antibody Screen NEG    Sample Expiration 05/19/2015    Extend sample reason NO TRANSFUSIONS OR PREGNANCY IN THE PAST 3 MONTHS   Protime-INR     Status: None   Collection Time: 05/05/15 10:36 AM  Result Value Ref Range   Prothrombin Time 12.7 11.4 - 15.0 seconds   INR 0.93   Potassium     Status: None   Collection Time: 05/12/15 11:01 AM  Result Value Ref Range   Potassium 3.7 3.5 - 5.1 mmol/L  Surgical pathology     Status: None   Collection Time: 05/12/15  1:00 PM  Result Value Ref Range   SURGICAL PATHOLOGY      Surgical Pathology CASE: [id number] PATIENT: [facility] Surgical Pathology Report     SPECIMEN SUBMITTED: A. Femoral head, right  CLINICAL HISTORY: Degenerative osteoarthritis right hip  PRE-OPERATIVE DIAGNOSIS:   POST-OPERATIVE DIAGNOSIS:      DIAGNOSIS: A. FEMORAL HEAD, RIGHT; TOTAL ARTHROPLASTY: - OSTEOARTHROSIS. - NEGATIVE FOR MALIGNANCY.   GROSS DESCRIPTION: A. Labeled: Right femoral head  Size of specimen:      Head -4.8 x 3.2 x 3.5 cm      Neck -3 x 2.7 x 1.5 cm  Articular surface: Pink red with a small amount of roughening and raggedness  Cut surface: Pink red and firm with no masses or lesions grossly noted  Other findings: None  Block summary: 1 - representative section(s), post decalcification      Final Diagnosis performed by Quay Burow, MD.  Electronically signed 05/14/2015 10:22:36AM    The electronic signature  indicates that the named Attending Pathologist has evaluated the specimen  Technical component perf ormed at [facility], [address], [place], [place] [address] Lab: [phone] Dir: Darrick Penna. Evette Doffing, MD  Professional component performed at [facility], [facility], [place], [place], [place] [address] Lab: [phone] Dir: Dellia Nims. Rubinas, MD    CBC     Status: Abnormal   Collection Time: 05/12/15  4:56 PM  Result Value Ref Range   WBC 17.2 (H) 3.6 - 11.0 K/uL   RBC 4.36 3.80 - 5.20 MIL/uL   Hemoglobin 12.0 12.0 - 16.0 g/dL   HCT 36.7 35.0 - 47.0 %   MCV 84.2 80.0 - 100.0 fL   MCH 27.4 26.0 - 34.0 pg   MCHC 32.6 32.0 - 36.0 g/dL   RDW 16.3 (H) 11.5 - 14.5 %   Platelets 206 150 - 440 K/uL  Creatinine, serum     Status: None   Collection Time: 05/12/15  4:56 PM  Result Value Ref Range   Creatinine, Ser 0.56 0.44 - 1.00 mg/dL   GFR calc non Af Amer >60 >60 mL/min   GFR  calc Af Amer >60 >60 mL/min    Comment: (NOTE) The eGFR has been calculated using the CKD EPI equation. This calculation has not been validated in all clinical situations. eGFR's persistently <60 mL/min signify possible Chronic Kidney Disease.   Hemoglobin     Status: Abnormal   Collection Time: 05/13/15  5:29 AM  Result Value Ref Range   Hemoglobin 10.6 (L) 12.0 - 16.0 g/dL    PHQ2/9: Depression screen [facility] 2/9 07/08/2015 05/27/2015 03/24/2015 12/03/2014 11/26/2014  Decreased Interest 0 0 0 0 0  Down, Depressed, Hopeless 0 0 0 0 0  PHQ - 2 Score 0 0 0 0 0     Fall Risk: Fall Risk  07/08/2015 05/27/2015 04/07/2015 03/24/2015 01/20/2015  Falls in the past year? _0   Number falls in past yr: - - - - -  Injury with Fall? - - - - -  Risk for fall due to : - - - - -  Follow up - - - - -     Functional Status Survey: Is the patient deaf or have difficulty hearing?: No Does the patient have difficulty seeing, even when wearing glasses/contacts?: No Does the patient have  difficulty concentrating, remembering, or making decisions?: No Does the patient have difficulty walking or climbing stairs?: Yes Does the patient have difficulty dressing or bathing?: No Does the patient have difficulty doing errands alone such as visiting a doctor's office or shopping?: No    Assessment & Plan   1. Migraine without aura and without status migrainosus, not intractable  - amitriptyline (ELAVIL) 10 MG tablet; Take 1 tablet (10 mg total) by mouth at bedtime.  Dispense: 30 tablet; Refill: 2  2. COPD, moderate ([place])  - Ipratropium-Albuterol (COMBIVENT) 20-100 MCG/ACT AERS respimat; Inhale 1 puff into the lungs every 6 (six) hours.  Dispense: 4 g; Refill: 5 - tiotropium (SPIRIVA) 18 MCG inhalation capsule; Place 1 capsule (18 mcg total) into inhaler and inhale daily.  Dispense: 30 capsule; Refill: 5  3. Sleep apnea  [facility]  4. Dyslipidemia  On diet only -lipid panel  5. Muscle spasm  - carisoprodol (SOMA) 350 MG tablet; Take 1 tablet (350 mg total) by mouth at bedtime.  Dispense: 30 tablet; Refill: 2  6. GERD without esophagitis  - dexlansoprazole (DEXILANT) 60 MG capsule; Take 1 capsule (60 mg total) by mouth daily.  Dispense: 30 capsule; Refill: 5

## 2015-07-09 ENCOUNTER — Telehealth: Payer: Self-pay | Admitting: Family Medicine

## 2015-07-09 DIAGNOSIS — B070 Plantar wart: Secondary | ICD-10-CM

## 2015-07-09 LAB — LIPID PANEL
CHOLESTEROL TOTAL: 218 mg/dL — AB (ref 100–199)
Chol/HDL Ratio: 5 ratio units — ABNORMAL HIGH (ref 0.0–4.4)
HDL: 44 mg/dL (ref >39)
LDL CALC: 129 mg/dL — AB (ref 0–99)
TRIGLYCERIDES: 227 mg/dL — AB (ref 0–149)
VLDL CHOLESTEROL CAL: 45 mg/dL — AB (ref 5–40)

## 2015-07-09 NOTE — Telephone Encounter (Signed)
Tried to contact [facility] at [phone] but their line was busy. Will try again later.

## 2015-07-09 NOTE — Telephone Encounter (Signed)
Pt called back and is requesting a mask and hose for her C-Pap to be called into [facility].

## 2015-07-09 NOTE — Telephone Encounter (Signed)
Pt states she is supposed to get a referral to the foot doctor. I dont see a order in her chart. Please advise.

## 2015-07-13 ENCOUNTER — Telehealth: Payer: Self-pay

## 2015-07-13 NOTE — Telephone Encounter (Signed)
Patient returned my call from 07/09/15.  Labs were reviewed and then patient stated, "tell Dr. Ancil Boozer, that Elavil, I don't even feel them. I've even tried 2 at the same time and I still didn't feel them so it has be something higher than 20mg ."

## 2015-07-13 NOTE — Telephone Encounter (Signed)
The medication is for migraine prevention, not for the treatment of . She needs to give it some time.

## 2015-07-14 NOTE — Telephone Encounter (Signed)
Patient was informed and simply replied, "ok."

## 2015-07-17 DIAGNOSIS — G4733 Obstructive sleep apnea (adult) (pediatric): Secondary | ICD-10-CM | POA: Diagnosis not present

## 2015-07-20 ENCOUNTER — Other Ambulatory Visit: Payer: Self-pay

## 2015-07-20 MED ORDER — IBUPROFEN 800 MG PO TABS: 800.0000 mg | ORAL_TABLET | Freq: Three times a day (TID) | ORAL | Status: DC | PRN

## 2015-07-20 NOTE — Telephone Encounter (Signed)
Got a fax from [facility] requesting a refill of this patient's ibuprofen.  Refill request was sent to Dr. Steele Sizer for approval and submission.

## 2015-07-22 ENCOUNTER — Encounter: Payer: Self-pay | Admitting: Anesthesiology

## 2015-07-22 ENCOUNTER — Other Ambulatory Visit: Payer: Self-pay | Admitting: Anesthesiology

## 2015-07-22 ENCOUNTER — Ambulatory Visit: Payer: Medicare Other | Attending: Anesthesiology | Admitting: Anesthesiology

## 2015-07-22 VITALS — BP 126/87 | HR 85 | Temp 97.5°F | Resp 16 | Ht 64.0 in | Wt 220.0 lb

## 2015-07-22 DIAGNOSIS — M47812 Spondylosis without myelopathy or radiculopathy, cervical region: Secondary | ICD-10-CM

## 2015-07-22 DIAGNOSIS — K5909 Other constipation: Secondary | ICD-10-CM | POA: Insufficient documentation

## 2015-07-22 DIAGNOSIS — M25552 Pain in left hip: Secondary | ICD-10-CM | POA: Diagnosis not present

## 2015-07-22 DIAGNOSIS — M1990 Unspecified osteoarthritis, unspecified site: Secondary | ICD-10-CM | POA: Insufficient documentation

## 2015-07-22 DIAGNOSIS — E785 Hyperlipidemia, unspecified: Secondary | ICD-10-CM | POA: Insufficient documentation

## 2015-07-22 DIAGNOSIS — I10 Essential (primary) hypertension: Secondary | ICD-10-CM | POA: Insufficient documentation

## 2015-07-22 DIAGNOSIS — Z96651 Presence of right artificial knee joint: Secondary | ICD-10-CM | POA: Diagnosis not present

## 2015-07-22 DIAGNOSIS — Z79891 Long term (current) use of opiate analgesic: Secondary | ICD-10-CM | POA: Insufficient documentation

## 2015-07-22 DIAGNOSIS — G8929 Other chronic pain: Secondary | ICD-10-CM | POA: Insufficient documentation

## 2015-07-22 DIAGNOSIS — M545 Low back pain, unspecified: Secondary | ICD-10-CM

## 2015-07-22 DIAGNOSIS — J449 Chronic obstructive pulmonary disease, unspecified: Secondary | ICD-10-CM | POA: Diagnosis not present

## 2015-07-22 DIAGNOSIS — G4730 Sleep apnea, unspecified: Secondary | ICD-10-CM | POA: Diagnosis not present

## 2015-07-22 DIAGNOSIS — K219 Gastro-esophageal reflux disease without esophagitis: Secondary | ICD-10-CM | POA: Diagnosis not present

## 2015-07-22 DIAGNOSIS — Z96642 Presence of left artificial hip joint: Secondary | ICD-10-CM | POA: Diagnosis not present

## 2015-07-22 DIAGNOSIS — M4692 Unspecified inflammatory spondylopathy, cervical region: Secondary | ICD-10-CM | POA: Diagnosis not present

## 2015-07-22 DIAGNOSIS — F319 Bipolar disorder, unspecified: Secondary | ICD-10-CM | POA: Insufficient documentation

## 2015-07-22 DIAGNOSIS — Z9981 Dependence on supplemental oxygen: Secondary | ICD-10-CM | POA: Diagnosis not present

## 2015-07-22 DIAGNOSIS — R52 Pain, unspecified: Secondary | ICD-10-CM | POA: Insufficient documentation

## 2015-07-22 DIAGNOSIS — H9190 Unspecified hearing loss, unspecified ear: Secondary | ICD-10-CM | POA: Insufficient documentation

## 2015-07-22 DIAGNOSIS — Z842 Family history of other diseases of the genitourinary system: Secondary | ICD-10-CM

## 2015-07-22 DIAGNOSIS — Z90710 Acquired absence of both cervix and uterus: Secondary | ICD-10-CM | POA: Diagnosis not present

## 2015-07-22 DIAGNOSIS — Z96641 Presence of right artificial hip joint: Secondary | ICD-10-CM | POA: Insufficient documentation

## 2015-07-22 DIAGNOSIS — M179 Osteoarthritis of knee, unspecified: Secondary | ICD-10-CM

## 2015-07-22 DIAGNOSIS — M1711 Unilateral primary osteoarthritis, right knee: Secondary | ICD-10-CM

## 2015-07-22 DIAGNOSIS — F418 Other specified anxiety disorders: Secondary | ICD-10-CM | POA: Diagnosis not present

## 2015-07-22 DIAGNOSIS — F1190 Opioid use, unspecified, uncomplicated: Secondary | ICD-10-CM | POA: Diagnosis not present

## 2015-07-22 DIAGNOSIS — G4700 Insomnia, unspecified: Secondary | ICD-10-CM | POA: Diagnosis not present

## 2015-07-22 DIAGNOSIS — M150 Primary generalized (osteo)arthritis: Secondary | ICD-10-CM | POA: Diagnosis not present

## 2015-07-22 DIAGNOSIS — M5386 Other specified dorsopathies, lumbar region: Secondary | ICD-10-CM

## 2015-07-22 DIAGNOSIS — F17210 Nicotine dependence, cigarettes, uncomplicated: Secondary | ICD-10-CM | POA: Diagnosis not present

## 2015-07-22 DIAGNOSIS — M25551 Pain in right hip: Secondary | ICD-10-CM | POA: Diagnosis not present

## 2015-07-22 DIAGNOSIS — M542 Cervicalgia: Secondary | ICD-10-CM

## 2015-07-22 DIAGNOSIS — F411 Generalized anxiety disorder: Secondary | ICD-10-CM

## 2015-07-22 DIAGNOSIS — G8918 Other acute postprocedural pain: Secondary | ICD-10-CM

## 2015-07-22 DIAGNOSIS — M160 Bilateral primary osteoarthritis of hip: Secondary | ICD-10-CM | POA: Insufficient documentation

## 2015-07-22 DIAGNOSIS — M5136 Other intervertebral disc degeneration, lumbar region: Secondary | ICD-10-CM | POA: Insufficient documentation

## 2015-07-22 DIAGNOSIS — E119 Type 2 diabetes mellitus without complications: Secondary | ICD-10-CM | POA: Diagnosis not present

## 2015-07-22 DIAGNOSIS — M170 Bilateral primary osteoarthritis of knee: Secondary | ICD-10-CM

## 2015-07-22 DIAGNOSIS — M8949 Other hypertrophic osteoarthropathy, multiple sites: Secondary | ICD-10-CM

## 2015-07-22 DIAGNOSIS — G43009 Migraine without aura, not intractable, without status migrainosus: Secondary | ICD-10-CM | POA: Insufficient documentation

## 2015-07-22 DIAGNOSIS — M51369 Other intervertebral disc degeneration, lumbar region without mention of lumbar back pain or lower extremity pain: Secondary | ICD-10-CM

## 2015-07-22 DIAGNOSIS — M47816 Spondylosis without myelopathy or radiculopathy, lumbar region: Secondary | ICD-10-CM

## 2015-07-22 DIAGNOSIS — M159 Polyosteoarthritis, unspecified: Secondary | ICD-10-CM

## 2015-07-22 DIAGNOSIS — M1712 Unilateral primary osteoarthritis, left knee: Secondary | ICD-10-CM

## 2015-07-22 MED ORDER — OXYCODONE HCL 10 MG PO TABS: 10.0000 mg | ORAL_TABLET | Freq: Three times a day (TID) | ORAL | Status: DC

## 2015-07-22 NOTE — Progress Notes (Signed)
Safety precautions to be maintained throughout the outpatient stay will include: orient to surroundings, keep bed in low position, maintain call bell within reach at all times, provide assistance with transfer out of bed and ambulation.  Patient thinks med not working  -pt took extra to help with pain- also called dr Rudene Christians and he prescribed oxy 5mg  (only 35 pills) per pt

## 2015-07-22 NOTE — Patient Instructions (Signed)

## 2015-07-22 NOTE — Progress Notes (Signed)
Subjective:    Patient ID: Wendy Johnson, female    DOB: 05-30-1965, 50 y.o.   MRN: [id number]    Chief complaint: Chronic low back pain. Chronic bilateral lower extremity pain and diffuse body pain  HPI: Wendy Johnson for reevaluation. She was recently seen here in the pain control Center and now status post several surgeries. She had a right hip replaced just a few weeks ago with a right knee replacement several months ago and a left hip replacement 4 months ago. She continues to have pain in the bilateral hips which is mainly post surgical but improving followed by Dr. Rudene Johnson. She does report that she received 36 oxycodone 5 mg tablets from Dr. Rudene Johnson for some additional breakthrough pain associated with her recent surgery. Her knee pain is at baseline. She still has some chronic low back pain and neck pain. She has been doing some physical therapy as well and continues to take her medications as prescribed. She denies any diverting or illicit use. She maintains that she has been quite miserable with much of the persistent joint pain despite medications. She is requesting an increase to her baseline dosing during Johnson's visit. Quality of her symptoms have been stable but her primary problem is that they have been persistent in nature.   LMP 09/26/2014    Current outpatient prescriptions:  .  amitriptyline (ELAVIL) 10 MG tablet, Take 1 tablet (10 mg total) by mouth at bedtime., Disp: 30 tablet, Rfl: 2 .  amphetamine-dextroamphetamine (ADDERALL) 30 MG tablet, Take 30 mg by mouth 2 (two) times daily., Disp: , Rfl:  .  ARIPiprazole (ABILIFY) 15 MG tablet, Take 15 mg by mouth daily., Disp: , Rfl:  .  busPIRone (BUSPAR) 10 MG tablet, Take 20 mg by mouth 2 (two) times daily. , Disp: , Rfl:  .  carisoprodol (SOMA) 350 MG tablet, Take 1 tablet (350 mg total) by mouth at bedtime., Disp: 30 tablet, Rfl: 2 .  clonazePAM (KLONOPIN) 1 MG tablet, Take 1-2 mg by mouth 3 (three) times daily as needed for  anxiety., Disp: , Rfl:  .  dexlansoprazole (DEXILANT) 60 MG capsule, Take 1 capsule (60 mg total) by mouth daily., Disp: 30 capsule, Rfl: 5 .  ibuprofen (ADVIL,MOTRIN) 800 MG tablet, Take 1 tablet (800 mg total) by mouth every 8 (eight) hours as needed., Disp: 90 tablet, Rfl: 0 .  Ipratropium-Albuterol (COMBIVENT) 20-100 MCG/ACT AERS respimat, Inhale 1 puff into the lungs every 6 (six) hours., Disp: 4 g, Rfl: 5 .  nabumetone (RELAFEN) 750 MG tablet, Take by mouth., Disp: , Rfl:  .  Oxycodone HCl 10 MG TABS, Take 1 tablet (10 mg total) by mouth 3 (three) times daily before meals., Disp: 90 tablet, Rfl: 0 .  sertraline (ZOLOFT) 50 MG tablet, Take 100 mg by mouth 2 (two) times daily. , Disp: , Rfl:  .  tiotropium (SPIRIVA) 18 MCG inhalation capsule, Place 1 capsule (18 mcg total) into inhaler and inhale daily., Disp: 30 capsule, Rfl: 5 .  traZODone (DESYREL) 150 MG tablet, Take 300 mg by mouth at bedtime as needed for sleep. , Disp: , Rfl:    Patient Active Problem List   Diagnosis Date Noted  . Primary osteoarthritis of right hip 05/12/2015  . Sleep apnea 04/07/2015  . Primary osteoarthritis of left hip 12/16/2014  . Metabolic syndrome 123XX123  . Migraine without aura and without status migrainosus, not intractable 11/26/2014  . GERD without esophagitis 11/26/2014  . COPD, moderate ([place]) 11/26/2014  .  Nocturnal oxygen desaturation 11/26/2014  . Supplemental oxygen dependent 11/26/2014  . Hearing loss 11/26/2014  . Chronic insomnia 11/26/2014  . History of hypertension 11/26/2014  . Dyslipidemia 11/26/2014  . History of morbid obesity 11/26/2014  . History of diabetes mellitus, type II 11/26/2014  . Chronic constipation 11/26/2014  . Generalized anxiety disorder 11/11/2014  . DDD (degenerative disc disease), lumbar 11/11/2014  . Facet arthritis of lumbar region 11/11/2014  . Primary osteoarthritis involving multiple joints 11/11/2014  . H/O hysterectomy for benign disease 10/20/2014   . History of endometriosis 10/16/2014  . Low back derangement syndrome 09/30/2014  . Degenerative joint disease of both lower legs 09/30/2014  . Bipolar disorder ([place]) 09/30/2014     Review of Systems  Review of systems negative for cardiovascular negative for pulmonary she snores if her neurologic positive for anxiety depression panic attacks and insomnia she also has bipolar disease as CD with a history of reflux negative for GU negative for hematologic negative for endocrine negative for rheumatologic  Social history is positive for being divorced with one child she smokes pack cigarettes per day for doing this for 12 years  Currently disabled since 2006.  Family history is positive for alcoholism diabetes and high blood pressure     Objective:   Physical Exam  Patient is 50 year old white female in no acute distress is alert and oriented 3 and compliant with examination. Her heart is regular rate and rhythm.     Assessment & Plan:  #1. Chronic low back pain with low back syndrome and degenerative disc disease  #2. Diffuse body pain with osteoarthritis  #3. Degenerative joint disease with severe left hip pain and right hip pain  #4. History of bipolar disorder  #5. History of anxiety depression on chronic opioid management  Plan:Based on her discussion Johnson I'm going to increase her baseline to 105 tablets of the oxycodone which will enable her to have an additional tablet 4 times a day dosing to be spanned out over the month. Hopefully this will enable her to have better control for breakthrough pain I also encouraged her to continue with physical therapy regimen and continue follow-up with orthopedics. She is to return to clinic in 2 months.  Dr. Vashti Hey

## 2015-07-27 LAB — TOXASSURE SELECT 13 (MW), URINE: PDF: 0

## 2015-07-28 DIAGNOSIS — M25551 Pain in right hip: Secondary | ICD-10-CM | POA: Diagnosis not present

## 2015-07-29 DIAGNOSIS — M898X1 Other specified disorders of bone, shoulder: Secondary | ICD-10-CM | POA: Diagnosis not present

## 2015-07-29 DIAGNOSIS — S43214A Anterior dislocation of right sternoclavicular joint, initial encounter: Secondary | ICD-10-CM | POA: Diagnosis not present

## 2015-07-30 DIAGNOSIS — L97521 Non-pressure chronic ulcer of other part of left foot limited to breakdown of skin: Secondary | ICD-10-CM | POA: Diagnosis not present

## 2015-07-30 DIAGNOSIS — L851 Acquired keratosis [keratoderma] palmaris et plantaris: Secondary | ICD-10-CM | POA: Diagnosis not present

## 2015-07-30 DIAGNOSIS — E119 Type 2 diabetes mellitus without complications: Secondary | ICD-10-CM | POA: Diagnosis not present

## 2015-08-03 DIAGNOSIS — G8929 Other chronic pain: Secondary | ICD-10-CM | POA: Diagnosis not present

## 2015-08-03 DIAGNOSIS — M25551 Pain in right hip: Secondary | ICD-10-CM | POA: Diagnosis not present

## 2015-08-03 DIAGNOSIS — M25561 Pain in right knee: Secondary | ICD-10-CM | POA: Diagnosis not present

## 2015-08-11 DIAGNOSIS — M25551 Pain in right hip: Secondary | ICD-10-CM | POA: Diagnosis not present

## 2015-08-13 DIAGNOSIS — M25551 Pain in right hip: Secondary | ICD-10-CM | POA: Diagnosis not present

## 2015-08-17 DIAGNOSIS — M25551 Pain in right hip: Secondary | ICD-10-CM | POA: Diagnosis not present

## 2015-08-18 DIAGNOSIS — F4310 Post-traumatic stress disorder, unspecified: Secondary | ICD-10-CM | POA: Diagnosis not present

## 2015-08-18 DIAGNOSIS — Z79899 Other long term (current) drug therapy: Secondary | ICD-10-CM | POA: Diagnosis not present

## 2015-08-18 DIAGNOSIS — F3189 Other bipolar disorder: Secondary | ICD-10-CM | POA: Diagnosis not present

## 2015-08-18 DIAGNOSIS — F901 Attention-deficit hyperactivity disorder, predominantly hyperactive type: Secondary | ICD-10-CM | POA: Diagnosis not present

## 2015-08-18 DIAGNOSIS — F902 Attention-deficit hyperactivity disorder, combined type: Secondary | ICD-10-CM | POA: Diagnosis not present

## 2015-08-18 DIAGNOSIS — F410 Panic disorder [episodic paroxysmal anxiety] without agoraphobia: Secondary | ICD-10-CM | POA: Diagnosis not present

## 2015-08-20 DIAGNOSIS — M25551 Pain in right hip: Secondary | ICD-10-CM | POA: Diagnosis not present

## 2015-08-25 DIAGNOSIS — M25551 Pain in right hip: Secondary | ICD-10-CM | POA: Diagnosis not present

## 2015-08-28 DIAGNOSIS — M79672 Pain in left foot: Secondary | ICD-10-CM | POA: Diagnosis not present

## 2015-08-28 DIAGNOSIS — L84 Corns and callosities: Secondary | ICD-10-CM | POA: Diagnosis not present

## 2015-08-31 DIAGNOSIS — T8484XA Pain due to internal orthopedic prosthetic devices, implants and grafts, initial encounter: Secondary | ICD-10-CM | POA: Diagnosis not present

## 2015-08-31 DIAGNOSIS — Z96641 Presence of right artificial hip joint: Secondary | ICD-10-CM | POA: Diagnosis not present

## 2015-09-01 DIAGNOSIS — L97521 Non-pressure chronic ulcer of other part of left foot limited to breakdown of skin: Secondary | ICD-10-CM | POA: Diagnosis not present

## 2015-09-01 DIAGNOSIS — L02619 Cutaneous abscess of unspecified foot: Secondary | ICD-10-CM | POA: Diagnosis not present

## 2015-09-01 DIAGNOSIS — L03119 Cellulitis of unspecified part of limb: Secondary | ICD-10-CM | POA: Diagnosis not present

## 2015-09-07 ENCOUNTER — Telehealth: Payer: Self-pay

## 2015-09-07 DIAGNOSIS — G43009 Migraine without aura, not intractable, without status migrainosus: Secondary | ICD-10-CM

## 2015-09-07 NOTE — Telephone Encounter (Signed)
Patient called and states she is unable to feel Amitriptyline 10 mg 1 tablet daily at all, her headaches are frequent and long. She has even tried to take 3-4 of them to relieve her headaches, is there anyway you can call in a higher dose for the patient. Thanks

## 2015-09-08 ENCOUNTER — Inpatient Hospital Stay: Admission: RE | Admit: 2015-09-08 | Payer: Medicare Other | Source: Ambulatory Visit

## 2015-09-08 MED ORDER — AMITRIPTYLINE HCL 25 MG PO TABS: 25.0000 mg | ORAL_TABLET | Freq: Two times a day (BID) | ORAL | Status: DC

## 2015-09-08 NOTE — Telephone Encounter (Signed)
We will increase dose to 25 mg one to two pills daily maximum.  I can refer her to headache center if no improvement

## 2015-09-09 ENCOUNTER — Encounter
Admission: RE | Admit: 2015-09-09 | Discharge: 2015-09-09 | Disposition: A | Discharge status: Home / Self Care | Payer: Medicare Other | Source: Ambulatory Visit | Attending: Orthopedic Surgery | Admitting: Orthopedic Surgery

## 2015-09-09 DIAGNOSIS — E7800 Pure hypercholesterolemia, unspecified: Secondary | ICD-10-CM | POA: Diagnosis not present

## 2015-09-09 DIAGNOSIS — G4730 Sleep apnea, unspecified: Secondary | ICD-10-CM | POA: Diagnosis not present

## 2015-09-09 DIAGNOSIS — E785 Hyperlipidemia, unspecified: Secondary | ICD-10-CM | POA: Insufficient documentation

## 2015-09-09 DIAGNOSIS — K219 Gastro-esophageal reflux disease without esophagitis: Secondary | ICD-10-CM | POA: Insufficient documentation

## 2015-09-09 DIAGNOSIS — J449 Chronic obstructive pulmonary disease, unspecified: Secondary | ICD-10-CM | POA: Insufficient documentation

## 2015-09-09 DIAGNOSIS — Z01810 Encounter for preprocedural cardiovascular examination: Secondary | ICD-10-CM | POA: Diagnosis not present

## 2015-09-09 DIAGNOSIS — Z01812 Encounter for preprocedural laboratory examination: Secondary | ICD-10-CM | POA: Diagnosis not present

## 2015-09-09 DIAGNOSIS — I10 Essential (primary) hypertension: Secondary | ICD-10-CM | POA: Insufficient documentation

## 2015-09-09 HISTORY — DX: Reserved for inherently not codable concepts without codable children: IMO0001

## 2015-09-09 HISTORY — DX: Sleep apnea, unspecified: G47.30

## 2015-09-09 HISTORY — DX: Attention-deficit hyperactivity disorder, unspecified type: F90.9

## 2015-09-09 LAB — CBC
HCT: 35.4 % (ref 35.0–47.0)
Hemoglobin: 11.5 g/dL — ABNORMAL LOW (ref 12.0–16.0)
MCH: 23.7 pg — ABNORMAL LOW (ref 26.0–34.0)
MCHC: 32.4 g/dL (ref 32.0–36.0)
MCV: 73.2 fL — ABNORMAL LOW (ref 80.0–100.0)
Platelets: 233 10*3/uL (ref 150–440)
RBC: 4.84 MIL/uL (ref 3.80–5.20)
RDW: 17.7 % — AB (ref 11.5–14.5)
WBC: 5.7 10*3/uL (ref 3.6–11.0)

## 2015-09-09 LAB — BASIC METABOLIC PANEL
Anion gap: 10 (ref 5–15)
BUN: 16 mg/dL (ref 6–20)
CALCIUM: 9.5 mg/dL (ref 8.9–10.3)
CO2: 24 mmol/L (ref 22–32)
CREATININE: 0.69 mg/dL (ref 0.44–1.00)
Chloride: 103 mmol/L (ref 101–111)
Glucose, Bld: 66 mg/dL (ref 65–99)
Potassium: 4 mmol/L (ref 3.5–5.1)
SODIUM: 137 mmol/L (ref 135–145)

## 2015-09-09 LAB — URINALYSIS COMPLETE WITH MICROSCOPIC (ARMC ONLY)
Bacteria, UA: NONE SEEN
Bilirubin Urine: NEGATIVE
GLUCOSE, UA: NEGATIVE mg/dL
HGB URINE DIPSTICK: NEGATIVE
Ketones, ur: NEGATIVE mg/dL
Leukocytes, UA: NEGATIVE
NITRITE: NEGATIVE
PROTEIN: NEGATIVE mg/dL
SPECIFIC GRAVITY, URINE: 1.014 (ref 1.005–1.030)
pH: 7 (ref 5.0–8.0)

## 2015-09-09 LAB — SEDIMENTATION RATE: Sed Rate: 30 mm/hr — ABNORMAL HIGH (ref 0–20)

## 2015-09-09 LAB — PROTIME-INR
INR: 0.97
PROTHROMBIN TIME: 13.1 s (ref 11.4–15.0)

## 2015-09-09 LAB — TYPE AND SCREEN
ABO/RH(D): A POS
Antibody Screen: NEGATIVE

## 2015-09-09 LAB — SURGICAL PCR SCREEN
MRSA, PCR: NEGATIVE
STAPHYLOCOCCUS AUREUS: NEGATIVE

## 2015-09-09 LAB — APTT: aPTT: 28 seconds (ref 24–36)

## 2015-09-09 NOTE — Patient Instructions (Signed)
  Your procedure is scheduled on:Sep 22, 2014 (Tuesday) Report to Day Surgery.([facility]) SECOND FLOOR To find out your arrival time please call [phone] between 1PM - 3PM on Sep 20, 2013 (Monday).  Remember: Instructions that are not followed completely may result in serious medical risk, up to and including death, or upon the discretion of your surgeon and anesthesiologist your surgery may need to be rescheduled.    __x__ 1. Do not eat food or drink liquids after midnight. No gum chewing or hard candies.     __x__ 2. No Alcohol for 24 hours before or after surgery.   ____ 3. Bring all medications with you on the day of surgery if instructed.    ___x_ 4. Notify your doctor if there is any change in your medical condition     (cold, fever, infections).     Do not wear jewelry, make-up, hairpins, clips or nail polish.  Do not wear lotions, powders, or perfumes. You may wear deodorant.  Do not shave 48 hours prior to surgery. Men may shave face and neck.  Do not bring valuables to the hospital.    [facility] is not responsible for any belongings or valuables.               Contacts, dentures or bridgework may not be worn into surgery.  Leave your suitcase in the car. After surgery it may be brought to your room.  For patients admitted to the hospital, discharge time is determined by your                treatment team.   Patients discharged the day of surgery will not be allowed to drive home.   Please read over the following fact sheets that you were given:   MRSA Information and Surgical Site Infection Prevention   _x___ Take these medicines the morning of surgery with A SIP OF WATER:    1. Zoloft  2.   3.   4.  5.  6.  ____ Fleet Enema (as directed)   __x__ Use CHG Soap as directed  __x__ Use inhalers on the day of surgery (Use Spiriva and Combivent inhaler the morning of surgery )  ____ Stop metformin 2 days prior to surgery    ____ Take 1/2 of usual insulin  dose the night before surgery and none on the morning of surgery.   __x__ Stop Coumadin/Plavix/aspirin on (N/A)  ___x_ Stop Anti-inflammatories on (Stop Ibuprofen one week before surgery) NO NSAIDS   ____ Stop supplements until after surgery.    __x__ Bring C-Pap to the hospital.

## 2015-09-11 LAB — URINE CULTURE

## 2015-09-11 NOTE — Pre-Procedure Instructions (Signed)
Sed rate results sent Anesthesia for review.

## 2015-09-11 NOTE — Pre-Procedure Instructions (Signed)
Sed rate results sent to Dr Menz for review. 

## 2015-09-12 NOTE — Pre-Procedure Instructions (Signed)
Urine culture results sent to Dr. Rudene Christians for review.  Asked if wanted it recollected?

## 2015-09-15 DIAGNOSIS — L02619 Cutaneous abscess of unspecified foot: Secondary | ICD-10-CM | POA: Diagnosis not present

## 2015-09-15 DIAGNOSIS — L03119 Cellulitis of unspecified part of limb: Secondary | ICD-10-CM | POA: Diagnosis not present

## 2015-09-15 DIAGNOSIS — L97521 Non-pressure chronic ulcer of other part of left foot limited to breakdown of skin: Secondary | ICD-10-CM | POA: Diagnosis not present

## 2015-09-16 ENCOUNTER — Encounter: Payer: Self-pay | Admitting: Anesthesiology

## 2015-09-16 ENCOUNTER — Ambulatory Visit: Payer: Medicare Other | Attending: Anesthesiology | Admitting: Anesthesiology

## 2015-09-16 VITALS — BP 152/102 | HR 109 | Temp 97.8°F | Resp 16 | Ht 64.0 in | Wt 230.0 lb

## 2015-09-16 DIAGNOSIS — M5136 Other intervertebral disc degeneration, lumbar region: Secondary | ICD-10-CM

## 2015-09-16 DIAGNOSIS — K219 Gastro-esophageal reflux disease without esophagitis: Secondary | ICD-10-CM | POA: Insufficient documentation

## 2015-09-16 DIAGNOSIS — I10 Essential (primary) hypertension: Secondary | ICD-10-CM | POA: Diagnosis not present

## 2015-09-16 DIAGNOSIS — M4692 Unspecified inflammatory spondylopathy, cervical region: Secondary | ICD-10-CM

## 2015-09-16 DIAGNOSIS — M51369 Other intervertebral disc degeneration, lumbar region without mention of lumbar back pain or lower extremity pain: Secondary | ICD-10-CM

## 2015-09-16 DIAGNOSIS — E119 Type 2 diabetes mellitus without complications: Secondary | ICD-10-CM | POA: Insufficient documentation

## 2015-09-16 DIAGNOSIS — Z79891 Long term (current) use of opiate analgesic: Secondary | ICD-10-CM | POA: Insufficient documentation

## 2015-09-16 DIAGNOSIS — E8881 Metabolic syndrome: Secondary | ICD-10-CM | POA: Insufficient documentation

## 2015-09-16 DIAGNOSIS — F5104 Psychophysiologic insomnia: Secondary | ICD-10-CM | POA: Insufficient documentation

## 2015-09-16 DIAGNOSIS — M1990 Unspecified osteoarthritis, unspecified site: Secondary | ICD-10-CM | POA: Diagnosis not present

## 2015-09-16 DIAGNOSIS — J449 Chronic obstructive pulmonary disease, unspecified: Secondary | ICD-10-CM | POA: Insufficient documentation

## 2015-09-16 DIAGNOSIS — M47812 Spondylosis without myelopathy or radiculopathy, cervical region: Secondary | ICD-10-CM

## 2015-09-16 DIAGNOSIS — Z842 Family history of other diseases of the genitourinary system: Secondary | ICD-10-CM

## 2015-09-16 DIAGNOSIS — G43009 Migraine without aura, not intractable, without status migrainosus: Secondary | ICD-10-CM | POA: Diagnosis not present

## 2015-09-16 DIAGNOSIS — K5909 Other constipation: Secondary | ICD-10-CM | POA: Diagnosis not present

## 2015-09-16 DIAGNOSIS — E785 Hyperlipidemia, unspecified: Secondary | ICD-10-CM | POA: Diagnosis not present

## 2015-09-16 DIAGNOSIS — M545 Low back pain, unspecified: Secondary | ICD-10-CM

## 2015-09-16 DIAGNOSIS — G4730 Sleep apnea, unspecified: Secondary | ICD-10-CM | POA: Diagnosis not present

## 2015-09-16 DIAGNOSIS — F418 Other specified anxiety disorders: Secondary | ICD-10-CM | POA: Insufficient documentation

## 2015-09-16 DIAGNOSIS — M79605 Pain in left leg: Secondary | ICD-10-CM | POA: Diagnosis present

## 2015-09-16 DIAGNOSIS — M8949 Other hypertrophic osteoarthropathy, multiple sites: Secondary | ICD-10-CM

## 2015-09-16 DIAGNOSIS — M5386 Other specified dorsopathies, lumbar region: Secondary | ICD-10-CM

## 2015-09-16 DIAGNOSIS — F411 Generalized anxiety disorder: Secondary | ICD-10-CM

## 2015-09-16 DIAGNOSIS — M179 Osteoarthritis of knee, unspecified: Secondary | ICD-10-CM

## 2015-09-16 DIAGNOSIS — Z96643 Presence of artificial hip joint, bilateral: Secondary | ICD-10-CM | POA: Diagnosis not present

## 2015-09-16 DIAGNOSIS — R52 Pain, unspecified: Secondary | ICD-10-CM | POA: Diagnosis not present

## 2015-09-16 DIAGNOSIS — M150 Primary generalized (osteo)arthritis: Secondary | ICD-10-CM

## 2015-09-16 DIAGNOSIS — M159 Polyosteoarthritis, unspecified: Secondary | ICD-10-CM

## 2015-09-16 DIAGNOSIS — G8929 Other chronic pain: Secondary | ICD-10-CM | POA: Diagnosis not present

## 2015-09-16 DIAGNOSIS — F319 Bipolar disorder, unspecified: Secondary | ICD-10-CM | POA: Diagnosis not present

## 2015-09-16 DIAGNOSIS — Z96651 Presence of right artificial knee joint: Secondary | ICD-10-CM | POA: Diagnosis not present

## 2015-09-16 DIAGNOSIS — M170 Bilateral primary osteoarthritis of knee: Secondary | ICD-10-CM

## 2015-09-16 DIAGNOSIS — M160 Bilateral primary osteoarthritis of hip: Secondary | ICD-10-CM | POA: Insufficient documentation

## 2015-09-16 DIAGNOSIS — M1712 Unilateral primary osteoarthritis, left knee: Secondary | ICD-10-CM

## 2015-09-16 DIAGNOSIS — G8918 Other acute postprocedural pain: Secondary | ICD-10-CM

## 2015-09-16 DIAGNOSIS — Z9981 Dependence on supplemental oxygen: Secondary | ICD-10-CM | POA: Diagnosis not present

## 2015-09-16 DIAGNOSIS — F1190 Opioid use, unspecified, uncomplicated: Secondary | ICD-10-CM | POA: Diagnosis not present

## 2015-09-16 DIAGNOSIS — M47816 Spondylosis without myelopathy or radiculopathy, lumbar region: Secondary | ICD-10-CM

## 2015-09-16 DIAGNOSIS — M1711 Unilateral primary osteoarthritis, right knee: Secondary | ICD-10-CM

## 2015-09-16 DIAGNOSIS — M79604 Pain in right leg: Secondary | ICD-10-CM | POA: Diagnosis present

## 2015-09-16 MED ORDER — OXYCODONE HCL 10 MG PO TABS: 10.0000 mg | ORAL_TABLET | Freq: Three times a day (TID) | ORAL | Status: DC

## 2015-09-16 NOTE — Progress Notes (Signed)
Subjective:    Patient ID: Wendy Johnson, female    DOB: 1966/01/27, 50 y.o.   MRN: [id number]    Chief complaint: Chronic low back pain. Chronic bilateral lower extremity pain and diffuse body pain  HPI: Wendy Johnson presents today for reevaluation. She was recently seen here in the pain control Center and now status post several surgeries. She had a right hip replaced just a few weeks ago with a right knee replacement several months ago and a left hip replacement 4 months ago. She continues to have pain in the bilateral hips which is mainly post surgical but improving followed by Dr. Rudene Christians.  She was last seen 2 months ago  And recently saw Dr. Rudene Christians who is going to have to go back in for a repair of the right knee rod for securing.  She has had severe pain from this and has been using the percocet on a qid basis and therefore is early on refill today.   LMP 09/26/2014    Current outpatient prescriptions:  .  amitriptyline (ELAVIL) 25 MG tablet, Take 1 tablet (25 mg total) by mouth 2 (two) times daily., Disp: 60 tablet, Rfl: 0 .  amphetamine-dextroamphetamine (ADDERALL) 30 MG tablet, Take 30 mg by mouth 2 (two) times daily., Disp: , Rfl:  .  ARIPiprazole (ABILIFY) 15 MG tablet, Take 15 mg by mouth at bedtime. , Disp: , Rfl:  .  busPIRone (BUSPAR) 10 MG tablet, Take 20 mg by mouth 2 (two) times daily. , Disp: , Rfl:  .  carisoprodol (SOMA) 350 MG tablet, Take 1 tablet (350 mg total) by mouth at bedtime., Disp: 30 tablet, Rfl: 2 .  clonazePAM (KLONOPIN) 1 MG tablet, Take 1-2 mg by mouth 3 (three) times daily as needed for anxiety., Disp: , Rfl:  .  dexlansoprazole (DEXILANT) 60 MG capsule, Take 1 capsule (60 mg total) by mouth daily., Disp: 30 capsule, Rfl: 5 .  ibuprofen (ADVIL,MOTRIN) 800 MG tablet, Take 1 tablet (800 mg total) by mouth every 8 (eight) hours as needed., Disp: 90 tablet, Rfl: 0 .  Ipratropium-Albuterol (COMBIVENT) 20-100 MCG/ACT AERS respimat, Inhale 1 puff into the lungs every 6 (six)  hours., Disp: 4 g, Rfl: 5 .  nabumetone (RELAFEN) 750 MG tablet, Take 750 mg by mouth 2 (two) times daily as needed. Reported on 07/22/2015, Disp: , Rfl:  .  Oxycodone HCl 10 MG TABS, Take 1 tablet (10 mg total) by mouth 3 (three) times daily before meals., Disp: 105 tablet, Rfl: 0 .  sertraline (ZOLOFT) 50 MG tablet, Take 100 mg by mouth 2 (two) times daily. , Disp: , Rfl:  .  tiotropium (SPIRIVA) 18 MCG inhalation capsule, Place 1 capsule (18 mcg total) into inhaler and inhale daily., Disp: 30 capsule, Rfl: 5 .  traZODone (DESYREL) 150 MG tablet, Take 300 mg by mouth at bedtime as needed for sleep. , Disp: , Rfl:    Patient Active Problem List   Diagnosis Date Noted  . Primary osteoarthritis of right hip 05/12/2015  . Sleep apnea 04/07/2015  . Primary osteoarthritis of left hip 12/16/2014  . Metabolic syndrome 123XX123  . Migraine without aura and without status migrainosus, not intractable 11/26/2014  . GERD without esophagitis 11/26/2014  . COPD, moderate ([place]) 11/26/2014  . Nocturnal oxygen desaturation 11/26/2014  . Supplemental oxygen dependent 11/26/2014  . Hearing loss 11/26/2014  . Chronic insomnia 11/26/2014  . History of hypertension 11/26/2014  . Dyslipidemia 11/26/2014  . History of morbid obesity 11/26/2014  .  History of diabetes mellitus, type II 11/26/2014  . Chronic constipation 11/26/2014  . Generalized anxiety disorder 11/11/2014  . DDD (degenerative disc disease), lumbar 11/11/2014  . Facet arthritis of lumbar region 11/11/2014  . Primary osteoarthritis involving multiple joints 11/11/2014  . H/O hysterectomy for benign disease 10/20/2014  . History of endometriosis 10/16/2014  . Low back derangement syndrome 09/30/2014  . Degenerative joint disease of both lower legs 09/30/2014  . Bipolar disorder ([place]) 09/30/2014     Review of Systems  Review of systems negative for cardiovascular negative for pulmonary she snores if her neurologic positive for anxiety  depression panic attacks and insomnia she also has bipolar disease as CD with a history of reflux negative for GU negative for hematologic negative for endocrine negative for rheumatologic  Social history is positive for being divorced with one child she smokes pack cigarettes per day for doing this for 12 years  Currently disabled since 2006.  Family history is positive for alcoholism diabetes and high blood pressure     Objective:   Physical Exam  Patient is 50 year old white female in no acute distress is alert and oriented 3 and compliant with examination. Her heart is regular rate and rhythm.     Assessment & Plan:  #1. Chronic low back pain with low back syndrome and degenerative disc disease  #2. Diffuse body pain with osteoarthritis  #3. Degenerative joint disease with severe left hip pain and right hip pain  #4. History of bipolar disorder  #5. History of anxiety depression on chronic opioid management  Plan:We will refill today with a recheck toxassure.  Last one was consistent.  Return to clinic in 2 months with surgery scheduled with Rudene Christians on Tuesday.   Dr. Vashti Hey

## 2015-09-16 NOTE — Patient Instructions (Signed)
Smoking Cessation, Tips for Success If you are ready to quit smoking, congratulations! You have chosen to help yourself be healthier. Cigarettes bring nicotine, tar, carbon monoxide, and other irritants into your body. Your lungs, heart, and blood vessels will be able to work better without these poisons. There are many different ways to quit smoking. Nicotine gum, nicotine patches, a nicotine inhaler, or nicotine nasal spray can help with physical craving. Hypnosis, support groups, and medicines help break the habit of smoking. WHAT THINGS CAN I DO TO MAKE QUITTING EASIER?  Here are some tips to help you quit for good:  Pick a date when you will quit smoking completely. Tell all of your friends and family about your plan to quit on that date.  Do not try to slowly cut down on the number of cigarettes you are smoking. Pick a quit date and quit smoking completely starting on that day.  Throw away all cigarettes.   Clean and remove all ashtrays from your home, work, and car.  On a card, write down your reasons for quitting. Carry the card with you and read it when you get the urge to smoke.  Cleanse your body of nicotine. Drink enough water and fluids to keep your urine clear or pale yellow. Do this after quitting to flush the nicotine from your body.  Learn to predict your moods. Do not let a bad situation be your excuse to have a cigarette. Some situations in your life might tempt you into wanting a cigarette.  Never have "just one" cigarette. It leads to wanting another and another. Remind yourself of your decision to quit.  Change habits associated with smoking. If you smoked while driving or when feeling stressed, try other activities to replace smoking. Stand up when drinking your coffee. Brush your teeth after eating. Sit in a different chair when you read the paper. Avoid alcohol while trying to quit, and try to drink fewer caffeinated beverages. Alcohol and caffeine may urge you to  smoke.  Avoid foods and drinks that can trigger a desire to smoke, such as sugary or spicy foods and alcohol.  Ask people who smoke not to smoke around you.  Have something planned to do right after eating or having a cup of coffee. For example, plan to take a walk or exercise.  Try a relaxation exercise to calm you down and decrease your stress. Remember, you may be tense and nervous for the first 2 weeks after you quit, but this will pass.  Find new activities to keep your hands busy. Play with a pen, coin, or rubber band. Doodle or draw things on paper.  Brush your teeth right after eating. This will help cut down on the craving for the taste of tobacco after meals. You can also try mouthwash.   Use oral substitutes in place of cigarettes. Try using lemon drops, carrots, cinnamon sticks, or chewing gum. Keep them handy so they are available when you have the urge to smoke.  When you have the urge to smoke, try deep breathing.  Designate your home as a nonsmoking area.  If you are a heavy smoker, ask your health care provider about a prescription for nicotine chewing gum. It can ease your withdrawal from nicotine.  Reward yourself. Set aside the cigarette money you save and buy yourself something nice.  Look for support from others. Join a support group or smoking cessation program. Ask someone at home or at work to help you with your plan   to quit smoking.  Always ask yourself, "Do I need this cigarette or is this just a reflex?" Tell yourself, "Today, I choose not to smoke," or "I do not want to smoke." You are reminding yourself of your decision to quit.  Do not replace cigarette smoking with electronic cigarettes (commonly called e-cigarettes). The safety of e-cigarettes is unknown, and some may contain harmful chemicals.  If you relapse, do not give up! Plan ahead and think about what you will do the next time you get the urge to smoke. HOW WILL I FEEL WHEN I QUIT SMOKING? You  may have symptoms of withdrawal because your body is used to nicotine (the addictive substance in cigarettes). You may crave cigarettes, be irritable, feel very hungry, cough often, get headaches, or have difficulty concentrating. The withdrawal symptoms are only temporary. They are strongest when you first quit but will go away within 10-14 days. When withdrawal symptoms occur, stay in control. Think about your reasons for quitting. Remind yourself that these are signs that your body is healing and getting used to being without cigarettes. Remember that withdrawal symptoms are easier to treat than the major diseases that smoking can cause.  Even after the withdrawal is over, expect periodic urges to smoke. However, these cravings are generally short lived and will go away whether you smoke or not. Do not smoke! WHAT RESOURCES ARE AVAILABLE TO HELP ME QUIT SMOKING? Your health care provider can direct you to community resources or hospitals for support, which may include:  Group support.  Education.  Hypnosis.  Therapy.   This information is not intended to replace advice given to you by your health care provider. Make sure you discuss any questions you have with your health care provider.   Document Released: 01/22/2004 Document Revised: 05/16/2014 Document Reviewed: 10/11/2012 Elsevier Interactive Patient Education 2016 [employer]  

## 2015-09-16 NOTE — Progress Notes (Signed)
Safety precautions to be maintained throughout the outpatient stay will include: orient to surroundings, keep bed in low position, maintain call bell within reach at all times, provide assistance with transfer out of bed and ambulation. Having revision of right knee next week.

## 2015-09-17 ENCOUNTER — Telehealth: Payer: Self-pay

## 2015-09-17 NOTE — Telephone Encounter (Signed)
DR adams gave order to change prescription to qid.  Insurance would not pay for Oxycodone #105 tid.  Will call pharmacy when patient brings script back.

## 2015-09-17 NOTE — Telephone Encounter (Signed)
Pt needs a nurse to call her so that she can explain what her prescription needs to say. Pt was unable to get prescriptions filled yesterday.

## 2015-09-18 ENCOUNTER — Telehealth: Payer: Self-pay | Admitting: Anesthesiology

## 2015-09-18 NOTE — Telephone Encounter (Signed)
Calling about scripts, says she spoke with nurse and nurse told her to take scripts back to [facility], which she has done, please let her know when she can get scripts filled asap

## 2015-09-18 NOTE — Telephone Encounter (Signed)
Spoke with Dr Andree Elk.  States he will write script over Monday .  Informed patient to come pick up Monday after 2 pm.  Patient states OK.

## 2015-09-18 NOTE — Telephone Encounter (Signed)
Insurance will not pay because the script is written for #105 tabs, with instructions 1 tab tid. Script needs to be rewritten.

## 2015-09-18 NOTE — Telephone Encounter (Signed)
Spoke with pharmacist.  Will not take a verbal change in sig.  Needs a hard copy.  Pharmacist also states that she got #105 of oxycodone 10 mg  filled on 4/17 from Dr Andree Elk and # 35 of oxycodone 5 mg filled on 4/24.

## 2015-09-21 ENCOUNTER — Ambulatory Visit (INDEPENDENT_AMBULATORY_CARE_PROVIDER_SITE_OTHER): Payer: Medicare Other | Admitting: Family Medicine

## 2015-09-21 ENCOUNTER — Encounter: Payer: Self-pay | Admitting: Family Medicine

## 2015-09-21 VITALS — BP 118/72 | HR 110 | Temp 97.7°F | Resp 18 | Wt 241.1 lb

## 2015-09-21 DIAGNOSIS — K1379 Other lesions of oral mucosa: Secondary | ICD-10-CM

## 2015-09-21 DIAGNOSIS — R519 Headache, unspecified: Secondary | ICD-10-CM

## 2015-09-21 DIAGNOSIS — R51 Headache: Secondary | ICD-10-CM

## 2015-09-21 DIAGNOSIS — K141 Geographic tongue: Secondary | ICD-10-CM | POA: Diagnosis not present

## 2015-09-21 DIAGNOSIS — T8484XA Pain due to internal orthopedic prosthetic devices, implants and grafts, initial encounter: Secondary | ICD-10-CM | POA: Diagnosis not present

## 2015-09-21 DIAGNOSIS — R198 Other specified symptoms and signs involving the digestive system and abdomen: Secondary | ICD-10-CM

## 2015-09-21 DIAGNOSIS — G4730 Sleep apnea, unspecified: Secondary | ICD-10-CM | POA: Diagnosis not present

## 2015-09-21 DIAGNOSIS — Z96641 Presence of right artificial hip joint: Secondary | ICD-10-CM | POA: Diagnosis not present

## 2015-09-21 MED ORDER — AMITRIPTYLINE HCL 50 MG PO TABS: 50.0000 mg | ORAL_TABLET | Freq: Every day | ORAL | Status: DC

## 2015-09-21 MED ORDER — MAGIC MOUTHWASH W/LIDOCAINE: 15.0000 mL | Freq: Four times a day (QID) | ORAL | Status: DC

## 2015-09-21 NOTE — Progress Notes (Signed)
Name: Wendy Johnson   MRN: [id number]    DOB: October 26, 1965   Date:09/21/2015       Progress Note  Subjective  Chief Complaint  Chief Complaint  Patient presents with  . Thrush    patient noticed a week ago that her tongue had a crack in it. she stated that it burns if she eats anything spicy or salty. she uses a CPAP machine and is not sure if this is the cause b/c afterwards she has a really dry mouth.    HPI  OSA: using CPAP every night, with oxygen, she is not sure if has a humidifier. Wakes up with mouth very dry. She needs to resume the mask during the night to drink water .  She has been compliant with machine, wears it all night. She wakes up feeling rested, seldom has a morning headache  Dry mouth/cracked tongue: she states that her mouth has severe dry mouth during the night , and since yesterday noticed a big crack on her tongue that hurts when she eats. No white spots. She has a personal history of trush  Headache: she states Elavil has improved the pain slightly, not as frequent, but still almost daily. May last 4-12 hours, no photophobia but she has mild  phonophobia. She states feels like triggered by stress. Also explained it may be rebound headaches from the multiple pain medications that she takes. She is also drinking a lot of caffeine and advised to stop drinking [facility]   Patient Active Problem List   Diagnosis Date Noted  . Primary osteoarthritis of right hip 05/12/2015  . Sleep apnea 04/07/2015  . Primary osteoarthritis of left hip 12/16/2014  . Metabolic syndrome 64/68/0321  . Migraine without aura and without status migrainosus, not intractable 11/26/2014  . GERD without esophagitis 11/26/2014  . COPD, moderate ([place]) 11/26/2014  . Nocturnal oxygen desaturation 11/26/2014  . Supplemental oxygen dependent 11/26/2014  . Hearing loss 11/26/2014  . Chronic insomnia 11/26/2014  . History of hypertension 11/26/2014  . Dyslipidemia 11/26/2014  . History of  morbid obesity 11/26/2014  . History of diabetes mellitus, type II 11/26/2014  . Chronic constipation 11/26/2014  . Generalized anxiety disorder 11/11/2014  . DDD (degenerative disc disease), lumbar 11/11/2014  . Facet arthritis of lumbar region 11/11/2014  . Primary osteoarthritis involving multiple joints 11/11/2014  . H/O hysterectomy for benign disease 10/20/2014  . History of endometriosis 10/16/2014  . Low back derangement syndrome 09/30/2014  . Degenerative joint disease of both lower legs 09/30/2014  . Bipolar disorder ([place]) 09/30/2014    Past Surgical History  Procedure Laterality Date  . Lipoma excision    . Knee arthroscopo Right   . Tonsillectomy    . Tubal ligation    . Laparoscopy  09/22/2014    Procedure: LAPAROSCOPY OPERATIVE;  Surgeon: Brayton Mars, MD;  Location: [facility] ORS;  Service: Gynecology;;  excision and fulgeration of endomertriosis  . Foot surgery Bilateral   . Bone spurs removed Bilateral   . Abdominal hysterectomy N/A 10/20/2014    Procedure: Total abdominial hysterectomy, bilateral salpingo-oophorectomy;  Surgeon: Brayton Mars, MD;  Location: [facility] ORS;  Service: Gynecology;  Laterality: N/A;  . Bilateral salpingoophorectomy    . Total hip arthroplasty Left 12/16/2014    Procedure: TOTAL HIP ARTHROPLASTY ANTERIOR APPROACH;  Surgeon: Hessie Knows, MD;  Location: [facility] ORS;  Service: Orthopedics;  Laterality: Left;  . Total hip arthroplasty Right 05/12/2015    Procedure: TOTAL HIP ARTHROPLASTY ANTERIOR APPROACH;  Surgeon: Hessie Knows, MD;  Location: [facility] ORS;  Service: Orthopedics;  Laterality: Right;  . Joint replacement Right     Total Knee replacement X 2  . Joint replacement Bilateral     Total Hip Replacement    Family History  Problem Relation Age of Onset  . Diabetes Mother   . Heart disease Father   . Heart attack Father   . Diabetes Sister     Social History   Social History  . Marital Status: Divorced    Spouse Name: N/A   . Number of Children: N/A  . Years of Education: N/A   Occupational History  . Not on file.   Social History Main Topics  . Smoking status: Current Every Day Smoker -- 1.00 packs/day for 35 years    Types: Cigarettes    Start date: 11/26/1979  . Smokeless tobacco: Never Used  . Alcohol Use: No  . Drug Use: No     Comment: quit crack cocaine 2004  . Sexual Activity:    Partners: Male    Patent examiner Protection: None   Other Topics Concern  . Not on file   Social History Narrative     Current outpatient prescriptions:  .  amitriptyline (ELAVIL) 25 MG tablet, Take by mouth. Reported on 09/16/2015, Disp: , Rfl:  .  amphetamine-dextroamphetamine (ADDERALL) 30 MG tablet, Take 30 mg by mouth 2 (two) times daily., Disp: , Rfl:  .  ARIPiprazole (ABILIFY) 15 MG tablet, Take 15 mg by mouth at bedtime. , Disp: , Rfl:  .  busPIRone (BUSPAR) 10 MG tablet, Take 20 mg by mouth 2 (two) times daily. , Disp: , Rfl:  .  busPIRone (BUSPAR) 30 MG tablet, , Disp: , Rfl:  .  carisoprodol (SOMA) 350 MG tablet, Take 1 tablet (350 mg total) by mouth at bedtime., Disp: 30 tablet, Rfl: 2 .  clonazePAM (KLONOPIN) 1 MG tablet, Take 1-2 mg by mouth 3 (three) times daily as needed for anxiety., Disp: , Rfl:  .  dexlansoprazole (DEXILANT) 60 MG capsule, Take 1 capsule (60 mg total) by mouth daily., Disp: 30 capsule, Rfl: 5 .  ibuprofen (ADVIL,MOTRIN) 800 MG tablet, Take 1 tablet (800 mg total) by mouth every 8 (eight) hours as needed. (Patient not taking: Reported on 09/16/2015), Disp: 90 tablet, Rfl: 0 .  Ipratropium-Albuterol (COMBIVENT) 20-100 MCG/ACT AERS respimat, Inhale 1 puff into the lungs every 6 (six) hours., Disp: 4 g, Rfl: 5 .  magic mouthwash w/lidocaine SOLN, Take 15 mLs by mouth 4 (four) times daily., Disp: 300 mL, Rfl: 1 .  nabumetone (RELAFEN) 750 MG tablet, Take 750 mg by mouth 2 (two) times daily as needed. Reported on 07/22/2015, Disp: , Rfl:  .  oxyCODONE (OXY IR/ROXICODONE) 5 MG immediate  release tablet, Reported on 09/16/2015, Disp: , Rfl:  .  Oxycodone HCl 10 MG TABS, Take 1 tablet (10 mg total) by mouth 3 (three) times daily before meals., Disp: 105 tablet, Rfl: 0 .  sertraline (ZOLOFT) 100 MG tablet, , Disp: , Rfl:  .  sertraline (ZOLOFT) 50 MG tablet, Take 100 mg by mouth 2 (two) times daily. , Disp: , Rfl:  .  tiotropium (SPIRIVA) 18 MCG inhalation capsule, Place 1 capsule (18 mcg total) into inhaler and inhale daily., Disp: 30 capsule, Rfl: 5 .  traZODone (DESYREL) 150 MG tablet, Take 300 mg by mouth at bedtime as needed for sleep. , Disp: , Rfl:  .  umeclidinium bromide (INCRUSE ELLIPTA) 62.5 MCG/INH AEPB, Inhale  into the lungs., Disp: , Rfl:   Allergies  Allergen Reactions  . Codeine Nausea Only  . Imitrex [Sumatriptan] Other (See Comments)    Chest pain     ROS  Ten systems reviewed and is negative except as mentioned in HPI  She has pain - going to have surgery on right knee soon - replacing a rod  Objective  Filed Vitals:   09/21/15 0807  BP: 118/72  Pulse: 110  Temp: 97.7 F (36.5 C)  TempSrc: Oral  Resp: 18  Weight: 241 lb 1.6 oz (109.362 kg)  SpO2: 97%    Body mass index is 41.36 kg/(m^2).  Physical Exam  Constitutional: Patient appears well-developed and well-nourished. Obese  No distress.  HEENT: head atraumatic, normocephalic, pupils equal and reactive to light, neck supple, throat within normal limits, tongue showing cracking, large one on the center of her tongue, no white spots Cardiovascular: Normal rate, regular rhythm and normal heart sounds.  No murmur heard. No BLE edema. Pulmonary/Chest: Effort normal and breath sounds normal. No respiratory distress. Abdominal: Soft.  There is no tenderness. Psychiatric: Patient has a normal mood and affect. behavior is normal. Judgment and thought content normal.  Recent Results (from the past 2160 hour(s))  Lipid Profile     Status: Abnormal   Collection Time: 07/08/15  8:55 AM  Result  Value Ref Range   Cholesterol, Total 218 (H) 100 - 199 mg/dL   Triglycerides 227 (H) 0 - 149 mg/dL   HDL 44 >39 mg/dL   VLDL Cholesterol Cal 45 (H) 5 - 40 mg/dL   LDL Calculated 129 (H) 0 - 99 mg/dL   Chol/HDL Ratio 5.0 (H) 0.0 - 4.4 ratio units    Comment:                                   T. Chol/HDL Ratio                                             Men  Women                               1/2 Avg.Risk  3.4    3.3                                   Avg.Risk  5.0    4.4                                2X Avg.Risk  9.6    7.1                                3X Avg.Risk 23.4   11.0   ToxASSURE Select 13 (MW), Urine     Status: None   Collection Time: 07/22/15  2:45 PM  Result Value Ref Range   ToxAssure Select 13 FINAL     Comment: ==================================================================== TOXASSURE SELECT 13 (MW) ==================================================================== Test  Result       Flag       Units Drug Present and Declared for Prescription Verification   7-aminoclonazepam              552          EXPECTED   ng/mg creat    7-aminoclonazepam is an expected metabolite of clonazepam. Source    of clonazepam is a scheduled prescription medication.   Oxycodone                      1848         EXPECTED   ng/mg creat   Oxymorphone                    1316         EXPECTED   ng/mg creat   Noroxycodone                   1745         EXPECTED   ng/mg creat   Noroxymorphone                 474          EXPECTED   ng/mg creat    Sources of oxycodone are scheduled prescription medications.    Oxymorphone, noroxycodone, and noroxymorphone are expected    metabolites of oxycodone. Oxymorphone is also available as a    scheduled prescription medication. Dru g Absent but Declared for Prescription Verification   Amphetamine                    Not Detected UNEXPECTED ng/mg  creat ==================================================================== Test                      Result    Flag   Units      Ref Range   Creatinine              31               mg/dL      >=20 ==================================================================== Declared Medications:  The flagging and interpretation on this report are based on the  following declared medications.  Unexpected results may arise from  inaccuracies in the declared medications.  **Note: The testing scope of this panel includes these medications:  Amphetamine (Adderall)  Clonazepam (Klonopin)  Oxycodone  **Note: The testing scope of this panel does not include following  reported medications:  Albuterol (Combivent)  Amitriptyline  Amitriptyline (Elavil)  Aripiprazole (Abilify)  Buspirone (BuSpar)  Carisoprodol (Soma)  Dexlansoprazole (Dexilant)  Ibuprofen (Advil)  Ipr atropium (Combivent)  Nabumetone (Relafen)  Sertraline (Zoloft)  Tiotropium (Spiriva)  Trazodone (Desyrel) ==================================================================== For clinical consultation, please call [phone]. ====================================================================    PDF .   Surgical pcr screen     Status: None   Collection Time: 09/09/15  8:27 AM  Result Value Ref Range   MRSA, PCR NEGATIVE NEGATIVE   Staphylococcus aureus NEGATIVE NEGATIVE    Comment:        The Xpert SA Assay (FDA approved for NASAL specimens in patients over 53 years of age), is one component of a comprehensive surveillance program.  Test performance has been validated by [facility] for patients greater than or equal to 61 year old. It is not intended to diagnose infection nor to guide or monitor treatment.   CBC     Status: Abnormal   Collection Time:  09/09/15  8:27 AM  Result Value Ref Range   WBC 5.7 3.6 - 11.0 K/uL   RBC 4.84 3.80 - 5.20 MIL/uL   Hemoglobin 11.5 (L) 12.0 - 16.0 g/dL   HCT 35.4 35.0 -  47.0 %   MCV 73.2 (L) 80.0 - 100.0 fL   MCH 23.7 (L) 26.0 - 34.0 pg   MCHC 32.4 32.0 - 36.0 g/dL   RDW 17.7 (H) 11.5 - 14.5 %   Platelets 233 150 - 440 K/uL  Basic metabolic panel     Status: None   Collection Time: 09/09/15  8:27 AM  Result Value Ref Range   Sodium 137 135 - 145 mmol/L   Potassium 4.0 3.5 - 5.1 mmol/L   Chloride 103 101 - 111 mmol/L   CO2 24 22 - 32 mmol/L   Glucose, Bld 66 65 - 99 mg/dL   BUN 16 6 - 20 mg/dL   Creatinine, Ser 0.69 0.44 - 1.00 mg/dL   Calcium 9.5 8.9 - 10.3 mg/dL   GFR calc non Af Amer >60 >60 mL/min   GFR calc Af Amer >60 >60 mL/min    Comment: (NOTE) The eGFR has been calculated using the CKD EPI equation. This calculation has not been validated in all clinical situations. eGFR's persistently <60 mL/min signify possible Chronic Kidney Disease.    Anion gap 10 5 - 15  Protime-INR     Status: None   Collection Time: 09/09/15  8:27 AM  Result Value Ref Range   Prothrombin Time 13.1 11.4 - 15.0 seconds   INR 0.97   APTT     Status: None   Collection Time: 09/09/15  8:27 AM  Result Value Ref Range   aPTT 28 24 - 36 seconds  Urine culture     Status: Abnormal   Collection Time: 09/09/15  8:27 AM  Result Value Ref Range   Specimen Description URINE, CLEAN CATCH    Special Requests NONE    Culture MULTIPLE SPECIES PRESENT, SUGGEST RECOLLECTION (A)    Report Status 09/11/2015 FINAL   Type and screen [facility]     Status: None   Collection Time: 09/09/15  8:27 AM  Result Value Ref Range   ABO/RH(D) A POS    Antibody Screen NEG    Sample Expiration 09/23/2015    Extend sample reason NO TRANSFUSIONS OR PREGNANCY IN THE PAST 3 MONTHS   Sedimentation rate     Status: Abnormal   Collection Time: 09/09/15  8:27 AM  Result Value Ref Range   Sed Rate 30 (H) 0 - 20 mm/hr  Urinalysis complete, with microscopic ([facility] only)     Status: Abnormal   Collection Time: 09/09/15  8:27 AM  Result Value Ref Range   Color, Urine  STRAW (A) YELLOW   APPearance CLEAR (A) CLEAR   Glucose, UA NEGATIVE NEGATIVE mg/dL   Bilirubin Urine NEGATIVE NEGATIVE   Ketones, ur NEGATIVE NEGATIVE mg/dL   Specific Gravity, Urine 1.014 1.005 - 1.030   Hgb urine dipstick NEGATIVE NEGATIVE   pH 7.0 5.0 - 8.0   Protein, ur NEGATIVE NEGATIVE mg/dL   Nitrite NEGATIVE NEGATIVE   Leukocytes, UA NEGATIVE NEGATIVE   RBC / HPF 0-5 0 - 5 RBC/hpf   WBC, UA 0-5 0 - 5 WBC/hpf   Bacteria, UA NONE SEEN NONE SEEN   Squamous Epithelial / LPF 0-5 (A) NONE SEEN      PHQ2/9: Depression screen [facility] 2/9 09/21/2015 09/16/2015 07/08/2015 05/27/2015 03/24/2015  Decreased Interest 0 0 0 0 0  Down, Depressed, Hopeless 0 0 0 0 0  PHQ - 2 Score 0 0 0 0 0     Fall Risk: Fall Risk  09/21/2015 09/16/2015 07/22/2015 07/08/2015 05/27/2015  Falls in the past year? _0   Number falls in past yr: - - - - -  Injury with Fall? - - - - -  Risk for fall due to : - - - - -  Follow up - - - - -      Functional Status Survey: Is the patient deaf or have difficulty hearing?: Yes (deaf in right ear) Does the patient have difficulty seeing, even when wearing glasses/contacts?: No Does the patient have difficulty concentrating, remembering, or making decisions?: No Does the patient have difficulty walking or climbing stairs?: Yes Does the patient have difficulty dressing or bathing?: No    Assessment & Plan  1. Sleep apnea  We will call [facility] and make sure she has a humidifier  2. Geographic tongue  Explained importance of avoid citric and spicy food  3. Mucosal irritation of oral cavity  - magic mouthwash w/lidocaine SOLN; Take 15 mLs by mouth 4 (four) times daily.  Dispense: 300 mL; Refill: 1  4. Headache disorder  Increase dose of Elavil to 50 mg qhs, stop caffeine and see if symptoms improves, otherwise we will stop Elavil and refer to headache center

## 2015-09-22 ENCOUNTER — Inpatient Hospital Stay
Admission: RE | Admit: 2015-09-22 | Discharge: 2015-09-24 | DRG: 488 | Disposition: A | Discharge status: Home Health | Payer: Medicare Other | Source: Ambulatory Visit | Attending: Orthopedic Surgery | Admitting: Orthopedic Surgery

## 2015-09-22 ENCOUNTER — Encounter
Admission: RE | Disposition: A | Discharge status: Home Health | Payer: Self-pay | Source: Ambulatory Visit | Attending: Orthopedic Surgery

## 2015-09-22 ENCOUNTER — Inpatient Hospital Stay: Payer: Medicare Other | Admitting: Anesthesiology

## 2015-09-22 ENCOUNTER — Encounter: Payer: Self-pay | Admitting: *Deleted

## 2015-09-22 DIAGNOSIS — F419 Anxiety disorder, unspecified: Secondary | ICD-10-CM | POA: Diagnosis present

## 2015-09-22 DIAGNOSIS — I10 Essential (primary) hypertension: Secondary | ICD-10-CM | POA: Diagnosis present

## 2015-09-22 DIAGNOSIS — J449 Chronic obstructive pulmonary disease, unspecified: Secondary | ICD-10-CM | POA: Diagnosis present

## 2015-09-22 DIAGNOSIS — Z833 Family history of diabetes mellitus: Secondary | ICD-10-CM

## 2015-09-22 DIAGNOSIS — Z8249 Family history of ischemic heart disease and other diseases of the circulatory system: Secondary | ICD-10-CM | POA: Diagnosis not present

## 2015-09-22 DIAGNOSIS — Z6841 Body Mass Index (BMI) 40.0 and over, adult: Secondary | ICD-10-CM

## 2015-09-22 DIAGNOSIS — F17210 Nicotine dependence, cigarettes, uncomplicated: Secondary | ICD-10-CM | POA: Diagnosis present

## 2015-09-22 DIAGNOSIS — H9190 Unspecified hearing loss, unspecified ear: Secondary | ICD-10-CM | POA: Diagnosis present

## 2015-09-22 DIAGNOSIS — Z9981 Dependence on supplemental oxygen: Secondary | ICD-10-CM | POA: Diagnosis not present

## 2015-09-22 DIAGNOSIS — E785 Hyperlipidemia, unspecified: Secondary | ICD-10-CM | POA: Diagnosis present

## 2015-09-22 DIAGNOSIS — F329 Major depressive disorder, single episode, unspecified: Secondary | ICD-10-CM | POA: Diagnosis present

## 2015-09-22 DIAGNOSIS — T84028A Dislocation of other internal joint prosthesis, initial encounter: Secondary | ICD-10-CM

## 2015-09-22 DIAGNOSIS — G4730 Sleep apnea, unspecified: Secondary | ICD-10-CM | POA: Diagnosis present

## 2015-09-22 DIAGNOSIS — F418 Other specified anxiety disorders: Secondary | ICD-10-CM | POA: Diagnosis not present

## 2015-09-22 DIAGNOSIS — Z96643 Presence of artificial hip joint, bilateral: Secondary | ICD-10-CM | POA: Diagnosis present

## 2015-09-22 DIAGNOSIS — Z96659 Presence of unspecified artificial knee joint: Secondary | ICD-10-CM

## 2015-09-22 DIAGNOSIS — E7800 Pure hypercholesterolemia, unspecified: Secondary | ICD-10-CM | POA: Diagnosis present

## 2015-09-22 DIAGNOSIS — Y838 Other surgical procedures as the cause of abnormal reaction of the patient, or of later complication, without mention of misadventure at the time of the procedure: Secondary | ICD-10-CM | POA: Diagnosis present

## 2015-09-22 DIAGNOSIS — K219 Gastro-esophageal reflux disease without esophagitis: Secondary | ICD-10-CM | POA: Diagnosis present

## 2015-09-22 DIAGNOSIS — Z96651 Presence of right artificial knee joint: Secondary | ICD-10-CM | POA: Diagnosis not present

## 2015-09-22 DIAGNOSIS — T84022A Instability of internal right knee prosthesis, initial encounter: Secondary | ICD-10-CM | POA: Diagnosis not present

## 2015-09-22 HISTORY — PX: TOTAL KNEE REVISION: SHX996

## 2015-09-22 LAB — CBC
HEMATOCRIT: 33.7 % — AB (ref 35.0–47.0)
Hemoglobin: 11 g/dL — ABNORMAL LOW (ref 12.0–16.0)
MCH: 24 pg — AB (ref 26.0–34.0)
MCHC: 32.6 g/dL (ref 32.0–36.0)
MCV: 73.8 fL — AB (ref 80.0–100.0)
PLATELETS: 201 10*3/uL (ref 150–440)
RBC: 4.57 MIL/uL (ref 3.80–5.20)
RDW: 17.4 % — ABNORMAL HIGH (ref 11.5–14.5)
WBC: 6.1 10*3/uL (ref 3.6–11.0)

## 2015-09-22 LAB — CREATININE, SERUM
Creatinine, Ser: 0.57 mg/dL (ref 0.44–1.00)
GFR calc non Af Amer: >60 mL/min (ref >60)

## 2015-09-22 SURGERY — TOTAL KNEE REVISION
Anesthesia: Spinal | Site: Knee | Laterality: Right | Wound class: Clean

## 2015-09-22 MED ORDER — CLONAZEPAM 1 MG PO TABS
1.0000 mg | ORAL_TABLET | Freq: Three times a day (TID) | ORAL | Status: DC | PRN
  Administered 2015-09-22: 1 mg via ORAL
  Administered 2015-09-23 – 2015-09-24 (×2): 2 mg via ORAL
  Filled 2015-09-22 (×2): qty 2
  Filled 2015-09-22: qty 1

## 2015-09-22 MED ORDER — IPRATROPIUM-ALBUTEROL 0.5-2.5 (3) MG/3ML IN SOLN: 3.0000 mL | Freq: Four times a day (QID) | RESPIRATORY_TRACT | Status: DC

## 2015-09-22 MED ORDER — PROPOFOL 500 MG/50ML IV EMUL
INTRAVENOUS | Status: DC | PRN
  Administered 2015-09-22: 75 ug/kg/min via INTRAVENOUS

## 2015-09-22 MED ORDER — PANTOPRAZOLE SODIUM 40 MG PO TBEC
40.0000 mg | DELAYED_RELEASE_TABLET | Freq: Every day | ORAL | Status: DC
  Administered 2015-09-22 – 2015-09-23 (×2): 40 mg via ORAL
  Filled 2015-09-22 (×2): qty 1

## 2015-09-22 MED ORDER — METHOCARBAMOL 1000 MG/10ML IJ SOLN: 500.0000 mg | Freq: Four times a day (QID) | INTRAVENOUS | Status: DC | PRN

## 2015-09-22 MED ORDER — MENTHOL 3 MG MT LOZG: 1.0000 | LOZENGE | OROMUCOSAL | Status: DC | PRN

## 2015-09-22 MED ORDER — LACTATED RINGERS IV SOLN
INTRAVENOUS | Status: DC
  Administered 2015-09-22 (×2): via INTRAVENOUS

## 2015-09-22 MED ORDER — TRANEXAMIC ACID 1000 MG/10ML IV SOLN
1000.0000 mg | INTRAVENOUS | Status: DC | PRN
  Administered 2015-09-22: 1500 mg via INTRAVENOUS

## 2015-09-22 MED ORDER — LIDOCAINE VISCOUS 2 % MT SOLN
5.0000 mL | Freq: Four times a day (QID) | OROMUCOSAL | Status: DC
  Administered 2015-09-23: 5 mL via OROMUCOSAL
  Filled 2015-09-22 (×10): qty 5

## 2015-09-22 MED ORDER — MAGNESIUM HYDROXIDE 400 MG/5ML PO SUSP: 30.0000 mL | Freq: Every day | ORAL | Status: DC | PRN

## 2015-09-22 MED ORDER — CARISOPRODOL 350 MG PO TABS
350.0000 mg | ORAL_TABLET | Freq: Every day | ORAL | Status: DC
  Administered 2015-09-22: 350 mg via ORAL
  Filled 2015-09-22: qty 1

## 2015-09-22 MED ORDER — MORPHINE SULFATE (PF) 10 MG/ML IV SOLN
INTRAVENOUS | Status: AC
  Filled 2015-09-22: qty 1

## 2015-09-22 MED ORDER — ZOLPIDEM TARTRATE 5 MG PO TABS: 5.0000 mg | ORAL_TABLET | Freq: Every evening | ORAL | Status: DC | PRN

## 2015-09-22 MED ORDER — SERTRALINE HCL 100 MG PO TABS
100.0000 mg | ORAL_TABLET | Freq: Two times a day (BID) | ORAL | Status: DC
  Administered 2015-09-22 – 2015-09-24 (×4): 100 mg via ORAL
  Filled 2015-09-22 (×4): qty 1

## 2015-09-22 MED ORDER — METOCLOPRAMIDE HCL 10 MG PO TABS: 5.0000 mg | ORAL_TABLET | Freq: Three times a day (TID) | ORAL | Status: DC | PRN

## 2015-09-22 MED ORDER — MAGIC MOUTHWASH W/LIDOCAINE
15.0000 mL | Freq: Four times a day (QID) | ORAL | Status: DC
  Filled 2015-09-22 (×3): qty 15

## 2015-09-22 MED ORDER — NEOMYCIN-POLYMYXIN B GU 40-200000 IR SOLN
Status: AC
  Filled 2015-09-22: qty 20

## 2015-09-22 MED ORDER — BUSPIRONE HCL 10 MG PO TABS
20.0000 mg | ORAL_TABLET | Freq: Two times a day (BID) | ORAL | Status: DC
  Administered 2015-09-22 – 2015-09-23 (×3): 20 mg via ORAL
  Filled 2015-09-22 (×3): qty 2

## 2015-09-22 MED ORDER — ARIPIPRAZOLE 15 MG PO TABS
15.0000 mg | ORAL_TABLET | Freq: Every day | ORAL | Status: DC
  Administered 2015-09-22 – 2015-09-23 (×2): 15 mg via ORAL
  Filled 2015-09-22 (×3): qty 1

## 2015-09-22 MED ORDER — CEFAZOLIN SODIUM-DEXTROSE 2-4 GM/100ML-% IV SOLN
2.0000 g | Freq: Once | INTRAVENOUS | Status: AC
  Administered 2015-09-22: 2 g via INTRAVENOUS

## 2015-09-22 MED ORDER — ONDANSETRON HCL 4 MG PO TABS: 4.0000 mg | ORAL_TABLET | Freq: Four times a day (QID) | ORAL | Status: DC | PRN

## 2015-09-22 MED ORDER — MAGIC MOUTHWASH
10.0000 mL | Freq: Four times a day (QID) | ORAL | Status: DC
  Administered 2015-09-23: 10 mL via ORAL
  Filled 2015-09-22 (×2): qty 10

## 2015-09-22 MED ORDER — METOCLOPRAMIDE HCL 5 MG/ML IJ SOLN: 5.0000 mg | Freq: Three times a day (TID) | INTRAMUSCULAR | Status: DC | PRN

## 2015-09-22 MED ORDER — ACETAMINOPHEN 650 MG RE SUPP: 650.0000 mg | Freq: Four times a day (QID) | RECTAL | Status: DC | PRN

## 2015-09-22 MED ORDER — DOCUSATE SODIUM 100 MG PO CAPS
100.0000 mg | ORAL_CAPSULE | Freq: Two times a day (BID) | ORAL | Status: DC
  Administered 2015-09-22 – 2015-09-23 (×3): 100 mg via ORAL
  Filled 2015-09-22 (×3): qty 1

## 2015-09-22 MED ORDER — ENOXAPARIN SODIUM 30 MG/0.3ML ~~LOC~~ SOLN
30.0000 mg | Freq: Two times a day (BID) | SUBCUTANEOUS | Status: DC
  Administered 2015-09-23 (×2): 30 mg via SUBCUTANEOUS
  Filled 2015-09-22 (×3): qty 0.3

## 2015-09-22 MED ORDER — OXYCODONE HCL 5 MG PO TABS
5.0000 mg | ORAL_TABLET | ORAL | Status: DC | PRN
  Administered 2015-09-22: 5 mg via ORAL
  Administered 2015-09-22: 10 mg via ORAL
  Filled 2015-09-22: qty 1

## 2015-09-22 MED ORDER — FENTANYL CITRATE (PF) 100 MCG/2ML IJ SOLN
25.0000 ug | INTRAMUSCULAR | Status: DC | PRN
  Administered 2015-09-22 (×2): 25 ug via INTRAVENOUS

## 2015-09-22 MED ORDER — TRAZODONE HCL 100 MG PO TABS
300.0000 mg | ORAL_TABLET | Freq: Every evening | ORAL | Status: DC | PRN
  Administered 2015-09-22: 300 mg via ORAL
  Filled 2015-09-22: qty 3

## 2015-09-22 MED ORDER — ONDANSETRON HCL 4 MG/2ML IJ SOLN: 4.0000 mg | Freq: Four times a day (QID) | INTRAMUSCULAR | Status: DC | PRN

## 2015-09-22 MED ORDER — BISACODYL 10 MG RE SUPP: 10.0000 mg | Freq: Every day | RECTAL | Status: DC | PRN

## 2015-09-22 MED ORDER — CEFAZOLIN SODIUM-DEXTROSE 2-4 GM/100ML-% IV SOLN
2.0000 g | Freq: Four times a day (QID) | INTRAVENOUS | Status: AC
  Administered 2015-09-22 – 2015-09-23 (×2): 2 g via INTRAVENOUS
  Filled 2015-09-22 (×3): qty 100

## 2015-09-22 MED ORDER — TIOTROPIUM BROMIDE MONOHYDRATE 18 MCG IN CAPS
18.0000 ug | ORAL_CAPSULE | Freq: Every day | RESPIRATORY_TRACT | Status: DC
  Administered 2015-09-23: 18 ug via RESPIRATORY_TRACT
  Filled 2015-09-22: qty 5

## 2015-09-22 MED ORDER — PHENOL 1.4 % MT LIQD: 1.0000 | OROMUCOSAL | Status: DC | PRN

## 2015-09-22 MED ORDER — AMPHETAMINE-DEXTROAMPHETAMINE 5 MG PO TABS
30.0000 mg | ORAL_TABLET | Freq: Two times a day (BID) | ORAL | Status: DC
  Administered 2015-09-23: 30 mg via ORAL
  Filled 2015-09-22 (×2): qty 6

## 2015-09-22 MED ORDER — ACETAMINOPHEN 325 MG PO TABS
650.0000 mg | ORAL_TABLET | Freq: Four times a day (QID) | ORAL | Status: DC | PRN
  Administered 2015-09-22: 650 mg via ORAL
  Filled 2015-09-22: qty 2

## 2015-09-22 MED ORDER — TRANEXAMIC ACID 1000 MG/10ML IV SOLN
1500.0000 mg | INTRAVENOUS | Status: DC
  Filled 2015-09-22: qty 15

## 2015-09-22 MED ORDER — FAMOTIDINE 20 MG PO TABS
20.0000 mg | ORAL_TABLET | Freq: Once | ORAL | Status: AC
  Administered 2015-09-22: 20 mg via ORAL

## 2015-09-22 MED ORDER — SODIUM CHLORIDE 0.9 % IV SOLN
INTRAVENOUS | Status: DC
  Administered 2015-09-22: 15:00:00 via INTRAVENOUS
  Administered 2015-09-23: 75 mL/h via INTRAVENOUS

## 2015-09-22 MED ORDER — NEOMYCIN-POLYMYXIN B GU 40-200000 IR SOLN
Status: DC | PRN
  Administered 2015-09-22: 16 mL

## 2015-09-22 MED ORDER — ALUM & MAG HYDROXIDE-SIMETH 200-200-20 MG/5ML PO SUSP: 30.0000 mL | ORAL | Status: DC | PRN

## 2015-09-22 MED ORDER — ONDANSETRON HCL 4 MG/2ML IJ SOLN: 4.0000 mg | Freq: Once | INTRAMUSCULAR | Status: DC | PRN

## 2015-09-22 MED ORDER — AMITRIPTYLINE HCL 25 MG PO TABS
50.0000 mg | ORAL_TABLET | Freq: Every day | ORAL | Status: DC
  Administered 2015-09-22 – 2015-09-23 (×2): 50 mg via ORAL
  Filled 2015-09-22 (×2): qty 2

## 2015-09-22 MED ORDER — MORPHINE SULFATE (PF) 2 MG/ML IV SOLN
2.0000 mg | INTRAVENOUS | Status: DC | PRN
  Administered 2015-09-22 (×2): 2 mg via INTRAVENOUS
  Filled 2015-09-22 (×2): qty 1

## 2015-09-22 MED ORDER — METHOCARBAMOL 500 MG PO TABS: 500.0000 mg | ORAL_TABLET | Freq: Four times a day (QID) | ORAL | Status: DC | PRN

## 2015-09-22 MED ORDER — IPRATROPIUM-ALBUTEROL 0.5-2.5 (3) MG/3ML IN SOLN: 3.0000 mL | Freq: Four times a day (QID) | RESPIRATORY_TRACT | Status: DC | PRN

## 2015-09-22 MED ORDER — OXYCODONE HCL 5 MG PO TABS
10.0000 mg | ORAL_TABLET | Freq: Three times a day (TID) | ORAL | Status: DC
  Administered 2015-09-22 – 2015-09-24 (×5): 10 mg via ORAL
  Filled 2015-09-22 (×5): qty 2

## 2015-09-22 MED ORDER — MAGNESIUM CITRATE PO SOLN: 1.0000 | Freq: Once | ORAL | Status: DC | PRN

## 2015-09-22 MED ORDER — MIDAZOLAM HCL 5 MG/5ML IJ SOLN
INTRAMUSCULAR | Status: DC | PRN
  Administered 2015-09-22: 2 mg via INTRAVENOUS

## 2015-09-22 MED ORDER — UMECLIDINIUM BROMIDE 62.5 MCG/INH IN AEPB
1.0000 | INHALATION_SPRAY | Freq: Every day | RESPIRATORY_TRACT | Status: DC
  Administered 2015-09-23: 1 via RESPIRATORY_TRACT
  Filled 2015-09-22: qty 7

## 2015-09-22 MED ORDER — FENTANYL CITRATE (PF) 100 MCG/2ML IJ SOLN
INTRAMUSCULAR | Status: DC | PRN
  Administered 2015-09-22: 100 ug via INTRAVENOUS

## 2015-09-22 MED ORDER — BUPIVACAINE HCL (PF) 0.5 % IJ SOLN
INTRAMUSCULAR | Status: DC | PRN
Start: 2015-09-22 — End: 2015-09-22
  Administered 2015-09-22: 2.8 mL

## 2015-09-22 SURGICAL SUPPLY — 54 items
BANDAGE ACE 6X5 VEL STRL LF (GAUZE/BANDAGES/DRESSINGS) ×2 IMPLANT
BLADE SAW 1 (BLADE) ×2 IMPLANT
BLADE SAW 1/2 (BLADE) ×2 IMPLANT
CANISTER SUCT 1200ML W/VALVE (MISCELLANEOUS) ×2 IMPLANT
CANISTER SUCT 3000ML (MISCELLANEOUS) ×4 IMPLANT
CATH FOL LEG HOLDER (MISCELLANEOUS) ×2 IMPLANT
CATH TRAY METER 16FR LF (MISCELLANEOUS) ×2 IMPLANT
CHLORAPREP W/TINT 26ML (MISCELLANEOUS) ×4 IMPLANT
COOLER POLAR GLACIER W/PUMP (MISCELLANEOUS) ×2 IMPLANT
COVER TABLE BACK 60X90 (DRAPES) ×2 IMPLANT
CUFF TOURN 24 STER (MISCELLANEOUS) IMPLANT
CUFF TOURN 30 STER DUAL PORT (MISCELLANEOUS) ×2 IMPLANT
DRAPE SHEET LG 3/4 BI-LAMINATE (DRAPES) ×8 IMPLANT
ELECT CAUTERY BLADE 6.4 (BLADE) ×2 IMPLANT
ELECT REM PT RETURN 9FT ADLT (ELECTROSURGICAL) ×2
ELECTRODE REM PT RTRN 9FT ADLT (ELECTROSURGICAL) ×1 IMPLANT
GAUZE PETRO XEROFOAM 1X8 (MISCELLANEOUS) ×2 IMPLANT
GAUZE SPONGE 4X4 12PLY STRL (GAUZE/BANDAGES/DRESSINGS) ×2 IMPLANT
GLOVE BIOGEL PI IND STRL 9 (GLOVE) ×2 IMPLANT
GLOVE BIOGEL PI INDICATOR 9 (GLOVE) ×2
GLOVE INDICATOR 8.0 STRL GRN (GLOVE) ×2 IMPLANT
GLOVE SURG ORTHO 8.0 STRL STRW (GLOVE) ×2 IMPLANT
GLOVE SURG ORTHO 9.0 STRL STRW (GLOVE) ×2 IMPLANT
GOWN STRL REUS W/ TWL LRG LVL3 (GOWN DISPOSABLE) ×1 IMPLANT
GOWN STRL REUS W/ TWL XL LVL3 (GOWN DISPOSABLE) ×1 IMPLANT
GOWN STRL REUS W/TWL LRG LVL3 (GOWN DISPOSABLE) ×1
GOWN STRL REUS W/TWL XL LVL3 (GOWN DISPOSABLE) ×1
GOWN SURG XXL (GOWNS) ×2 IMPLANT
HANDPIECE SUCTION TUBG SURGILV (MISCELLANEOUS) ×2 IMPLANT
HOOD PEEL AWAY FLYTE STAYCOOL (MISCELLANEOUS) ×4 IMPLANT
IMMBOLIZER KNEE 19 BLUE UNIV (SOFTGOODS) ×2 IMPLANT
INSERT TIBIAL TC3 15.0 (Knees) ×2 IMPLANT
KIT RM TURNOVER STRD PROC AR (KITS) ×2 IMPLANT
KNIFE SCULPS 14X20 (INSTRUMENTS) ×2 IMPLANT
NEEDLE SPNL 18GX3.5 QUINCKE PK (NEEDLE) ×2 IMPLANT
NEEDLE SPNL 20GX3.5 QUINCKE YW (NEEDLE) ×2 IMPLANT
NS IRRIG 1000ML POUR BTL (IV SOLUTION) ×2 IMPLANT
PACK TOTAL KNEE (MISCELLANEOUS) ×2 IMPLANT
PAD WRAPON POLAR KNEE (MISCELLANEOUS) ×1 IMPLANT
SOL .9 NS 3000ML IRR  AL (IV SOLUTION) ×1
SOL .9 NS 3000ML IRR UROMATIC (IV SOLUTION) ×1 IMPLANT
STAPLER SKIN PROX 35W (STAPLE) ×2 IMPLANT
SUCTION FRAZIER HANDLE 10FR (MISCELLANEOUS) ×1
SUCTION TUBE FRAZIER 10FR DISP (MISCELLANEOUS) ×1 IMPLANT
SUT DVC 2 QUILL PDO  T11 36X36 (SUTURE) ×1
SUT DVC 2 QUILL PDO T11 36X36 (SUTURE) ×1 IMPLANT
SUT DVC QUILL MONODERM 30X30 (SUTURE) ×2 IMPLANT
SUT TICRON 2-0 30IN 311381 (SUTURE) IMPLANT
SWAB CULTURE AMIES ANAERIB BLU (MISCELLANEOUS) IMPLANT
SYR 20CC LL (SYRINGE) ×2 IMPLANT
SYR 50ML LL SCALE MARK (SYRINGE) ×4 IMPLANT
TOWEL OR 17X26 4PK STRL BLUE (TOWEL DISPOSABLE) ×2 IMPLANT
TOWER CARTRIDGE SMART MIX (DISPOSABLE) ×2 IMPLANT
WRAPON POLAR PAD KNEE (MISCELLANEOUS) ×2

## 2015-09-22 NOTE — Anesthesia Postprocedure Evaluation (Signed)
Anesthesia Post Note  Patient: Wendy Johnson  Procedure(s) Performed: Procedure(s) (LRB): TOTAL KNEE REVISION/ REVISE POLYIETHYLENE (Right)  Patient location during evaluation: PACU Anesthesia Type: Spinal Level of consciousness: awake Pain management: pain level controlled Vital Signs Assessment: post-procedure vital signs reviewed and stable Cardiovascular status: stable Anesthetic complications: no    Last Vitals:  Filed Vitals:   09/22/15 1143 09/22/15 1400  BP: 127/74 102/56  Pulse: 99 90  Temp: 36.9 C 36.2 C  Resp: 18 17    Last Pain:  Filed Vitals:   09/22/15 1407  PainSc: Asleep                 VAN STAVEREN,Pocahontas Cohenour

## 2015-09-22 NOTE — Anesthesia Preprocedure Evaluation (Signed)
Anesthesia Evaluation  Patient identified by MRN, date of birth, ID band Patient awake    Reviewed: Allergy & Precautions, NPO status , Patient's Chart, lab work & pertinent test results  Airway Mallampati: II       Dental  (+) Teeth Intact   Pulmonary shortness of breath, sleep apnea , COPD, Current Smoker,     + decreased breath sounds      Cardiovascular hypertension, Pt. on medications  Rhythm:Regular Rate:Normal     Neuro/Psych Anxiety Depression Bipolar Disorder    GI/Hepatic Neg liver ROS, GERD  ,  Endo/Other  negative endocrine ROS  Renal/GU negative Renal ROS     Musculoskeletal negative musculoskeletal ROS (+)   Abdominal (+) + obese,   Peds  Hematology negative hematology ROS (+)   Anesthesia Other Findings   Reproductive/Obstetrics                             Anesthesia Physical Anesthesia Plan  ASA: III  Anesthesia Plan: Spinal   Post-op Pain Management:    Induction:   Airway Management Planned: Natural Airway and Nasal Cannula  Additional Equipment:   Intra-op Plan:   Post-operative Plan: Extubation in OR  Informed Consent: I have reviewed the patients History and Physical, chart, labs and discussed the procedure including the risks, benefits and alternatives for the proposed anesthesia with the patient or authorized representative who has indicated his/her understanding and acceptance.     Plan Discussed with: CRNA  Anesthesia Plan Comments:         Anesthesia Quick Evaluation

## 2015-09-22 NOTE — Progress Notes (Signed)
RT to room to instruct on IS, while in room patient states that she doesn't take neb tx's at home on schedule despite what home med rec list says.  She states she will not take them on schedule.

## 2015-09-22 NOTE — Transfer of Care (Signed)
Immediate Anesthesia Transfer of Care Note  Patient: Wendy Johnson  Procedure(s) Performed: Procedure(s): TOTAL KNEE REVISION/ REVISE POLYIETHYLENE (Right)  Patient Location: PACU  Anesthesia Type:Spinal  Level of Consciousness: awake, alert  and oriented  Airway & Oxygen Therapy: Patient Spontanous Breathing and Patient connected to face mask oxygen  Post-op Assessment: Report given to RN and Post -op Vital signs reviewed and stable  Post vital signs: Reviewed  Last Vitals:  Filed Vitals:   09/22/15 1143 09/22/15 1400  BP: 127/74 102/56  Pulse: 99 90  Temp: 36.9 C 36.2 C  Resp: 18 17    Last Pain:  Filed Vitals:   09/22/15 1401  PainSc: 6          Complications: No apparent anesthesia complications

## 2015-09-22 NOTE — H&P (Signed)
Reviewed paper H+P, will be scanned into chart. No changes noted.  

## 2015-09-22 NOTE — Op Note (Signed)
09/22/2015  2:05 PM  PATIENT:  Wendy Johnson  50 y.o. female  PRE-OPERATIVE DIAGNOSIS:  PO TOTAL KNEE instability right knee  POST-OPERATIVE DIAGNOSIS:  PO TOTAL KNEE instability right knee  PROCEDURE:  Procedure(s): TOTAL KNEE REVISION/ REVISE POLYIETHYLENE (Right)  SURGEON: Laurene Footman, MD  ASSISTANTS: Rachelle Hora [facility]  ANESTHESIA:   spinal  EBL:  Total I/O In: 800 [I.V.:800] Out: 100 [Urine:50; Blood:50]  BLOOD ADMINISTERED:none  DRAINS: none   LOCAL MEDICATIONS USED:  NONE  SPECIMEN:  Source of Specimen:  Culture right knee fluid  DISPOSITION OF SPECIMEN:  PATHOLOGY  COUNTS:  YES  TOURNIQUET:   36 minutes at 300 mmHg  IMPLANTS: Sigma tibial insert rotating platform TC 3 size 3, 15 millimeters  DICTATION: .Dragon Dictation patient brought the operating room and after adequate spinal anesthesia was obtained the right leg was prepped and draped in sterile fashion after patient identification and timeout procedure were completed tourniquet was raised to 300 mmHg. The prior incision was elliptically excised. A medial parapatellar arthrotomy was then performed and culture obtained. The joint was opened with elevating the capsule medially and removing scar behind the patellar tendon. The polyethylene component was exposed and on varus valgus stress there is significant opening on both sides getting the tibia to come forward allowed [place] to remove the polyethylene component and then did trials with a 15 mm trial there is still full extension and flexion 220 and intact stability to varus valgus stress this is deemed to be the appropriate final size and when this was inserted the knee was reduced and the knee appeared much more stable the knee was irrigated with pulse lavage and then closed with heavy Quill to a Quill subcutaneously followed by skin staples Xeroform 4 x 4's ABDs and web roll Polar Care and Ace wrap  PLAN OF CARE: Admit to inpatient   PATIENT DISPOSITION:  PACU  - hemodynamically stable.

## 2015-09-22 NOTE — Anesthesia Procedure Notes (Signed)
Spinal  Start time: 09/22/2015 12:40 PM End time: 09/22/2015 12:55 PM Reason for block: at surgeon's request Staffing Anesthesiologist: Marline Backbone F Performed by: anesthesiologist  Preanesthetic Checklist Completed: patient identified, site marked, surgical consent, pre-op evaluation, timeout performed, IV checked, risks and benefits discussed, monitors and equipment checked and at surgeon's request Spinal Block Patient position: sitting Prep: Betadine Patient monitoring: heart rate and blood pressure Approach: midline Location: L3-4 Injection technique: single-shot Needle Needle type: Tuohy  Needle gauge: 25 G Needle length: 9 cm Needle insertion depth: 7 cm Assessment Sensory level: T8

## 2015-09-23 ENCOUNTER — Encounter: Payer: Self-pay | Admitting: Orthopedic Surgery

## 2015-09-23 LAB — BASIC METABOLIC PANEL
Anion gap: 7 (ref 5–15)
BUN: 11 mg/dL (ref 6–20)
CHLORIDE: 102 mmol/L (ref 101–111)
CO2: 27 mmol/L (ref 22–32)
Calcium: 8.6 mg/dL — ABNORMAL LOW (ref 8.9–10.3)
Creatinine, Ser: 0.47 mg/dL (ref 0.44–1.00)
GFR calc non Af Amer: >60 mL/min (ref >60)
Glucose, Bld: 129 mg/dL — ABNORMAL HIGH (ref 65–99)
POTASSIUM: 4.1 mmol/L (ref 3.5–5.1)
SODIUM: 136 mmol/L (ref 135–145)

## 2015-09-23 LAB — CBC
HEMATOCRIT: 34.3 % — AB (ref 35.0–47.0)
HEMOGLOBIN: 11 g/dL — AB (ref 12.0–16.0)
MCH: 23.9 pg — ABNORMAL LOW (ref 26.0–34.0)
MCHC: 32.2 g/dL (ref 32.0–36.0)
MCV: 74.2 fL — ABNORMAL LOW (ref 80.0–100.0)
Platelets: 195 10*3/uL (ref 150–440)
RBC: 4.62 MIL/uL (ref 3.80–5.20)
RDW: 16.9 % — ABNORMAL HIGH (ref 11.5–14.5)
WBC: 10.5 10*3/uL (ref 3.6–11.0)

## 2015-09-23 LAB — TOXASSURE SELECT 13 (MW), URINE

## 2015-09-23 MED ORDER — ASPIRIN EC 325 MG PO TBEC
325.0000 mg | DELAYED_RELEASE_TABLET | Freq: Every day | ORAL | Status: DC
Start: 2015-09-23 — End: 2016-09-22

## 2015-09-23 MED ORDER — OXYCODONE HCL 5 MG PO TABS: 5.0000 mg | ORAL_TABLET | ORAL | Status: DC | PRN

## 2015-09-23 MED ORDER — OXYCODONE HCL 5 MG PO TABS
5.0000 mg | ORAL_TABLET | ORAL | Status: DC | PRN
  Administered 2015-09-23: 10 mg via ORAL
  Filled 2015-09-23 (×2): qty 2

## 2015-09-23 NOTE — Care Management Note (Addendum)
Case Management Note  Patient Details  Name: Wendy Johnson MRN: [id number] Date of Birth: 08/26/1965  Subjective/Objective:                  This RNCM attempted to reach patient by phone however there was no answer. I will try again to discuss discharge plan. She was here in January and received a 4-pronged cane. She has a rolling walker and bedside commode. She used [place] after each hospital visit. She at that time lived with her boyfriend and her son but had two sisters that planned to help her at discharge. She also had home O2 at that time--patient does not know name of agency. I have confirmed with all the above with patient.  Action/Plan: Referral to [place]. RNCM to continue to follow.   Expected Discharge Date:                  Expected Discharge Plan:     In-House Referral:     Discharge planning Services  CM Consult  Post Acute Care Choice:  Home Health Choice offered to:  Patient  DME Arranged:    DME Agency:     HH Arranged:    [place] Agency:  [place]  Status of Service:  In process, will continue to follow  Medicare Important Message Given:    Date Medicare IM Given:    Medicare IM give by:    Date Additional Medicare IM Given:    Additional Medicare Important Message give by:     If discussed at [place] of Stay Meetings, dates discussed:    Additional Comments:  Marshell Garfinkel, RN 09/23/2015, 7:18 AM

## 2015-09-23 NOTE — Progress Notes (Signed)
Physical Therapy Treatment Patient Details Name: Wendy Johnson MRN: [id number] DOB: 10/01/1965 Today's Date: 09/23/2015    History of Present Illness Pt is a 50 y.o. female s/p R total knee revision/revise polyiethylene 09/22/15 secondary to instability.  PMH includes s/p R THA 05/12/15 and L THA 12/16/15; also OCD, chronic back pain, sleep apnea, and ADHD.    PT Comments    Pt able to progress to ambulating 50 feet with RW CGA (limited distance d/t R knee pain).  Pt's O2 decreased from 90% at rest to 83% post ambulation (all on 2 L/min O2 via nasal cannula) and pt able to increase O2 to 88-90% post ambulation with cueing for breathing but O2 stayed 88% when pt not performing breathing technique.  Nursing notified and increased O2 to 3 L/min and pt's O2 90% at rest end of session.  Will continue to progress pt with R knee ROM, strengthening, and increasing ambulation distance next session.  Need to trial stairs prior to discharge.   Follow Up Recommendations  Home health PT;Supervision/Assistance - 24 hour     Equipment Recommendations       Recommendations for Other Services       Precautions / Restrictions Precautions Precautions: Fall;Knee Precaution Booklet Issued: Yes (comment) Restrictions Weight Bearing Restrictions: Yes RLE Weight Bearing: Weight bearing as tolerated    Mobility  Bed Mobility Overal bed mobility: Needs Assistance Bed Mobility: Sit to Supine     Sit to supine: Min assist   General bed mobility comments: assist for R LE; use of side rail  Transfers Overall transfer level: Needs assistance Equipment used: Rolling walker (2 wheeled) Transfers: Sit to/from [facility] Sit to Stand: Min guard Stand pivot transfers: Min guard (transfer to commode over toilet)       General transfer comment: minimal vc's for hand and feet placement required  Ambulation/Gait Ambulation/Gait assistance: Min guard Ambulation Distance (Feet):  (50 feet;  20 feet) Assistive device: Rolling walker (2 wheeled)   Gait velocity: decreased   General Gait Details: antalgic; decreased stance time R LE; increased UE support through RW noted   Stairs            Wheelchair Mobility    Modified Rankin (Stroke Patients Only)       Balance Overall balance assessment: Needs assistance Sitting-balance support: Bilateral upper extremity supported;Feet supported Sitting balance-Leahy Scale: Good     Standing balance support: Bilateral upper extremity supported (on RW) Standing balance-Leahy Scale: Good                      Cognition Arousal/Alertness: Lethargic (Increased alertness noted with activity) Behavior During Therapy: WFL for tasks assessed/performed Overall Cognitive Status: Within Functional Limits for tasks assessed                      Exercises Total Joint Exercises Heel Slides: AAROM;Right;15 reps;Supine Goniometric ROM: R knee extension 15 degrees short of neutral semi-supine in bed; R knee flexion 65 degrees sitting in chair (ROM performed in AM PT session)    General Comments General comments (skin integrity, edema, etc.): R knee dressings and polar care in place  Pt agreeable to PT session (pt soundly sleeping upon PT arrival and requesting to toilet during ambulation).      Pertinent Vitals/Pain Pain Assessment: 0-10 Pain Score: 8  Pain Location: R knee Pain Descriptors / Indicators: Sore;Tender;Operative site guarding;Grimacing;Guarding Pain Intervention(s): Limited activity within patient's tolerance;Monitored during session;Premedicated before session;Repositioned;Ice  applied  HR 96-106 bpm during session.    Home Living Family/patient expects to be discharged to:: Private residence Living Arrangements: Spouse/significant other (Boyfriend) Available Help at Discharge: Family;Friend(s);Available 24 hours/day (Pt's sister plans to assist 24/7) Type of Home: House Home Access: Stairs to  enter Entrance Stairs-Rails: Left Home Layout: One level Home Equipment: Walker - 2 wheels;Bedside commode;Cane - quad;Shower seat      Prior Function Level of Independence: Independent      Comments: Pt denies any falls in past 6 months.  Per prior PT eval, pt does not drive.   PT Goals (current goals can now be found in the care plan section) Acute Rehab PT Goals Patient Stated Goal: to go home PT Goal Formulation: With patient/family Time For Goal Achievement: 10/07/15 Potential to Achieve Goals: Good Progress towards PT goals: Progressing toward goals    Frequency  BID    PT Plan Current plan remains appropriate    Co-evaluation             End of Session Equipment Utilized During Treatment: Gait belt;Oxygen Activity Tolerance: Patient limited by pain Patient left: in bed;with call bell/phone within reach;with bed alarm set;with family/visitor present;with SCD's reapplied (L heel elevated via towel roll; R heel elevated via Zero degree bone foam; polar care in place and activated)     Time: [id number] PT Time Calculation (min) (ACUTE ONLY): 42 min  Charges:  $Gait Training: 8-22 mins $Therapeutic Exercise: 8-22 mins $Therapeutic Activity: 8-22 mins                    G CodesLeitha Bleak 10/21/2015, 3:00 PM Leitha Bleak, [place]

## 2015-09-23 NOTE — Discharge Instructions (Signed)
TOTAL KNEE REVISION OF POLYETHYLENE COMPONENT POSTOPERATIVE DIRECTIONS  Knee Rehabilitation, Guidelines Following Surgery  Results after knee surgery are often greatly improved when you follow the exercise, range of motion and muscle strengthening exercises prescribed by your doctor. Safety measures are also important to protect the knee from further injury. Any time any of these exercises cause you to have increased pain or swelling in your knee joint, decrease the amount until you are comfortable again and slowly increase them. If you have problems or questions, call your caregiver or physical therapist for advice.   HOME CARE INSTRUCTIONS  Remove items at home which could result in a fall. This includes throw rugs or furniture in walking pathways.   Continue to use polar care unit on the knee for pain and swelling from surgery. You may notice swelling that will progress down to the foot and ankle.  This is normal after surgery.  Elevate the leg when you are not up walking on it.    Continue to use the breathing machine which will help keep your temperature down.  It is common for your temperature to cycle up and down following surgery, especially at night when you are not up moving around and exerting yourself.  The breathing machine keeps your lungs expanded and your temperature down.  Do not place pillow under knee, focus on keeping the knee STRAIGHT while resting  DIET You may resume your previous home diet once your are discharged from the hospital.  DRESSING / WOUND CARE / SHOWERING You may start showering once staples have been removed. Change the surgical dressing as needed.  ACTIVITY Walk with your walker as instructed. Use walker as long as suggested by your caregivers. Avoid periods of inactivity such as sitting longer than an hour when not asleep. This helps prevent blood clots.  You may resume a sexual relationship in one month or when given the OK by your doctor.  You may  return to work once you are cleared by your doctor.  Do not drive a car for 6 weeks or until released by you surgeon.  Do not drive while taking narcotics.  WEIGHT BEARING Weight bearing as tolerated with assist device (walker, cane, etc) as directed, use it as long as suggested by your surgeon or therapist, typically at least 4-6 weeks.  POSTOPERATIVE CONSTIPATION PROTOCOL Constipation - defined medically as fewer than three stools per week and severe constipation as less than one stool per week.  One of the most common issues patients have following surgery is constipation.  Even if you have a regular bowel pattern at home, your normal regimen is likely to be disrupted due to multiple reasons following surgery.  Combination of anesthesia, postoperative narcotics, change in appetite and fluid intake all can affect your bowels.  In order to avoid complications following surgery, here are some recommendations in order to help you during your recovery period.  Colace (docusate) - Pick up an over-the-counter form of Colace or another stool softener and take twice a day as long as you are requiring postoperative pain medications.  Take with a full glass of water daily.  If you experience loose stools or diarrhea, hold the colace until you stool forms back up.  If your symptoms do not get better within 1 week or if they get worse, check with your doctor.  Dulcolax (bisacodyl) - Pick up over-the-counter and take as directed by the product packaging as needed to assist with the movement of your bowels.  Take with a full glass of water.  Use this product as needed if not relieved by Colace only.   MiraLax (polyethylene glycol) - Pick up over-the-counter to have on hand.  MiraLax is a solution that will increase the amount of water in your bowels to assist with bowel movements.  Take as directed and can mix with a glass of water, juice, soda, coffee, or tea.  Take if you go more than two days without a  movement. Do not use MiraLax more than once per day. Call your doctor if you are still constipated or irregular after using this medication for 7 days in a row.  If you continue to have problems with postoperative constipation, please contact the office for further assistance and recommendations.  If you experience "the worst abdominal pain ever" or develop nausea or vomiting, please contact the office immediatly for further recommendations for treatment.  ITCHING  If you experience itching with your medications, try taking only a single pain pill, or even half a pain pill at a time.  You can also use Benadryl over the counter for itching or also to help with sleep.   TED HOSE STOCKINGS Wear the elastic stockings on both legs for six weeks following surgery during the day but you may remove then at night for sleeping.  MEDICATIONS See your medication summary on the After Visit Summary that the nursing staff will review with you prior to discharge.  You may have some home medications which will be placed on hold until you complete the course of blood thinner medication.  It is important for you to complete the blood thinner medication as prescribed by your surgeon.  Continue your approved medications as instructed at time of discharge.  PRECAUTIONS If you experience chest pain or shortness of breath - call 911 immediately for transfer to the hospital emergency department.  If you develop a fever greater that 101 F, purulent drainage from wound, increased redness or drainage from wound, foul odor from the wound/dressing, or calf pain - CONTACT YOUR SURGEON.                                                   FOLLOW-UP APPOINTMENTS Make sure you keep all of your appointments after your operation with your surgeon and caregivers. You should call the office at the above phone number and make an appointment for approximately two weeks after the date of your surgery or on the date instructed by your  surgeon outlined in the "After Visit Summary".   RANGE OF MOTION AND STRENGTHENING EXERCISES  Rehabilitation of the knee is important following a knee injury or an operation. After just a few days of immobilization, the muscles of the thigh which control the knee become weakened and shrink (atrophy). Knee exercises are designed to build up the tone and strength of the thigh muscles and to improve knee motion. Often times heat used for twenty to thirty minutes before working out will loosen up your tissues and help with improving the range of motion but do not use heat for the first two weeks following surgery. These exercises can be done on a training (exercise) mat, on the floor, on a table or on a bed. Use what ever works the best and is most comfortable for you Knee exercises include:  Leg Lifts - While your  knee is still immobilized in a splint or cast, you can do straight leg raises. Lift the leg to 60 degrees, hold for 3 sec, and slowly lower the leg. Repeat 10-20 times 2-3 times daily. Perform this exercise against resistance later as your knee gets better.  Quad and Hamstring Sets - Tighten up the muscle on the front of the thigh (Quad) and hold for 5-10 sec. Repeat this 10-20 times hourly. Hamstring sets are done by pushing the foot backward against an object and holding for 5-10 sec. Repeat as with quad sets.   Leg Slides: Lying on your back, slowly slide your foot toward your buttocks, bending your knee up off the floor (only go as far as is comfortable). Then slowly slide your foot back down until your leg is flat on the floor again.  Angel Wings: Lying on your back spread your legs to the side as far apart as you can without causing discomfort.  A rehabilitation program following serious knee injuries can speed recovery and prevent re-injury in the future due to weakened muscles. Contact your doctor or a physical therapist for more information on knee rehabilitation.   IF YOU ARE  TRANSFERRED TO A SKILLED REHAB FACILITY If the patient is transferred to a skilled rehab facility following release from the hospital, a list of the current medications will be sent to the facility for the patient to continue.  When discharged from the skilled rehab facility, please have the facility set up the patient's [place] prior to being released. Also, the skilled facility will be responsible for providing the patient with their medications at time of release from the facility to include their pain medication, the muscle relaxants, and their blood thinner medication. If the patient is still at the rehab facility at time of the two week follow up appointment, the skilled rehab facility will also need to assist the patient in arranging follow up appointment in our office and any transportation needs.  MAKE SURE YOU:  Understand these instructions.  Get help right away if you are not doing well or get worse.

## 2015-09-23 NOTE — Progress Notes (Addendum)
   Subjective: 1 Day Post-Op Procedure(s) (LRB): TOTAL KNEE REVISION/ REVISE POLYIETHYLENE (Right) Patient reports pain as 8 on 0-10 scale.  Patient is well, and has had no acute complaints or problems Denies any CP, SOB, ABD pain. We will continue therapy today.  Plan is to go Home after hospital stay.  Objective: Vital signs in last 24 hours: Temp:  [97.1 F (36.2 C)-98.5 F (36.9 C)] 98.5 F (36.9 C) (05/17 0456) Pulse Rate:  [78-99] 85 (05/17 0458) Resp:  [15-24] 16 (05/17 0456) BP: (97-127)/(55-82) 97/56 mmHg (05/17 0456) SpO2:  [84 %-100 %] 92 % (05/17 0458) FiO2 (%):  [21 %] 21 % (05/16 1528) Weight:  [109.317 kg (241 lb)] 109.317 kg (241 lb) (05/16 1143)  Intake/Output from previous day: 05/16 0701 - 05/17 0700 In: 4056.3 [P.O.:490; I.V.:2216.3] Out: 2475 [Urine:2425; Blood:50] Intake/Output this shift:     Recent Labs  09/22/15 1558 09/23/15 0404  HGB 11.0* 11.0*    Recent Labs  09/22/15 1558 09/23/15 0404  WBC 6.1 10.5  RBC 4.57 4.62  HCT 33.7* 34.3*  PLT 201 195    Recent Labs  09/22/15 1558 09/23/15 0404  NA  --  136  K  --  4.1  CL  --  102  CO2  --  27  BUN  --  11  CREATININE 0.57 0.47  GLUCOSE  --  129*  CALCIUM  --  8.6*   No results for input(s): LABPT, INR in the last 72 hours.  EXAM General - Patient is Alert, Appropriate and Oriented. Patient sleeping upon entering room.  Extremity - Neurovascular intact Sensation intact distally Intact pulses distally Dorsiflexion/Plantar flexion intact No cellulitis present Dressing - dressing C/D/I and no drainage Motor Function - intact, moving foot and toes well on exam.   Past Medical History  Diagnosis Date  . High cholesterol   . GERD (gastroesophageal reflux disease)   . Anxiety   . Depression   . OCD (obsessive compulsive disorder)   . Chronic back pain   . Chronic constipation   . Severe obesity ([place])   . AR (allergic rhinitis)   . Trochanteric bursitis of right hip    . Tobacco use   . Hepatomegaly   . Dyslipidemia   . COPD (chronic obstructive pulmonary disease) ([place])   . Decreased dorsalis pedis pulse   . Fatty liver   . Chronic insomnia   . Benign hypertension   . Deaf   . Hot flashes   . Migraine with aura   . Arthritis   . Plantar warts   . Sleep apnea     C-PAP  . Shortness of breath dyspnea   . ADHD (attention deficit hyperactivity disorder)     Assessment/Plan:   1 Day Post-Op Procedure(s) (LRB): TOTAL KNEE REVISION/ REVISE POLYIETHYLENE (Right) Active Problems:   Instability of prosthetic knee (HCC)  Estimated body mass index is 41.35 kg/(m^2) as calculated from the following:   Height as of this encounter: 5\' 4"  (1.626 m).   Weight as of this encounter: 109.317 kg (241 lb). Advance diet Up with therapy  Recheck labs in the am DC morphine today Possible discharge home tomorrow depending on progress with PT today.  DVT Prophylaxis - Lovenox, Foot Pumps and TED hose Weight-Bearing as tolerated to right leg D/C O2 and Pulse OX and try on Room Air  T. Rachelle Hora, PA-C [place] 09/23/2015, 8:07 AM

## 2015-09-23 NOTE — Discharge Summary (Signed)
Physician Discharge Summary  Patient ID: Wendy Johnson MRN: [id number] DOB/AGE: [birth year] 50 y.o.  Admit date: 09/22/2015 Discharge date: 09/24/2015 Admission Diagnoses:  PO TOTAL KNEE   Discharge Diagnoses: Patient Active Problem List   Diagnosis Date Noted  . Instability of prosthetic knee ([place]) 09/22/2015  . Primary osteoarthritis of right hip 05/12/2015  . Sleep apnea 04/07/2015  . Primary osteoarthritis of left hip 12/16/2014  . Metabolic syndrome [id number]  . Migraine without aura and without status migrainosus, not intractable 11/26/2014  . GERD without esophagitis 11/26/2014  . COPD, moderate ([place]) 11/26/2014  . Nocturnal oxygen desaturation 11/26/2014  . Supplemental oxygen dependent 11/26/2014  . Hearing loss 11/26/2014  . Chronic insomnia 11/26/2014  . History of hypertension 11/26/2014  . Dyslipidemia 11/26/2014  . History of morbid obesity 11/26/2014  . History of diabetes mellitus, type II 11/26/2014  . Chronic constipation 11/26/2014  . Generalized anxiety disorder 11/11/2014  . DDD (degenerative disc disease), lumbar 11/11/2014  . Facet arthritis of lumbar region 11/11/2014  . Primary osteoarthritis involving multiple joints 11/11/2014  . H/O hysterectomy for benign disease 10/20/2014  . History of endometriosis 10/16/2014  . Low back derangement syndrome 09/30/2014  . Degenerative joint disease of both lower legs 09/30/2014  . Bipolar disorder ([place]) 09/30/2014    Past Medical History  Diagnosis Date  . High cholesterol   . GERD (gastroesophageal reflux disease)   . Anxiety   . Depression   . OCD (obsessive compulsive disorder)   . Chronic back pain   . Chronic constipation   . Severe obesity ([place])   . AR (allergic rhinitis)   . Trochanteric bursitis of right hip   . Tobacco use   . Hepatomegaly   . Dyslipidemia   . COPD (chronic obstructive pulmonary disease) ([place])   . Decreased dorsalis pedis pulse   . Fatty liver   . Chronic insomnia   .  Benign hypertension   . Deaf   . Hot flashes   . Migraine with aura   . Arthritis   . Plantar warts   . Sleep apnea     C-PAP  . Shortness of breath dyspnea   . ADHD (attention deficit hyperactivity disorder)      Transfusion: NONE   Consultants (if any):    Discharged Condition: Improved  Hospital Course: Wendy Johnson is an 50 y.o. female who was admitted 09/22/2015 with a diagnosis of right knee instability and went to the operating room on 09/22/2015 and underwent the above named procedures.    Surgeries: Procedure(s): TOTAL KNEE REVISION/ REVISE POLYIETHYLENE on 09/22/2015 Patient tolerated the surgery well. Taken to PACU where she was stabilized and then transferred to the orthopedic floor.  Started on Lovenox 30 q 12 hrs. Foot pumps applied bilaterally at 80 mm. Heels elevated on bed with rolled towels. No evidence of DVT. Negative [place]. Physical therapy started on day #1 for gait training and transfer. OT started day #1 for ADL and assisted devices.  Patient's foley was d/c on day #1. Patient's IV was d/c on day #2.  On post op day #2 patient was stable and ready for discharge to home with HHPT.  Implants: Sigma tibial insert rotating platform TC 3 size 3, 15 millimeters  She was given perioperative antibiotics:  Anti-infectives    Start     Dose/Rate Route Frequency Ordered Stop   09/22/15 1900  ceFAZolin (ANCEF) IVPB 2g/100 mL premix     2 g 200 mL/hr over 30 Minutes Intravenous  Every 6 hours 09/22/15 1527 09/23/15 0031   09/22/15 0515  ceFAZolin (ANCEF) IVPB 2g/100 mL premix     2 g 200 mL/hr over 30 Minutes Intravenous  Once 09/22/15 0507 09/22/15 1258    .  She was given sequential compression devices, early ambulation, and aspirin 325 mg for DVT prophylaxis.  She benefited maximally from the hospital stay and there were no complications.    Recent vital signs:  Filed Vitals:   09/23/15 0458 09/23/15 0920  BP:  100/58  Pulse: 85 84  Temp:  98.3 F  (36.8 C)  Resp:  16    Recent laboratory studies:  Lab Results  Component Value Date   HGB 11.0* 09/23/2015   HGB 11.0* 09/22/2015   HGB 11.5* 09/09/2015   Lab Results  Component Value Date   WBC 10.5 09/23/2015   PLT 195 09/23/2015   Lab Results  Component Value Date   INR 0.97 09/09/2015   Lab Results  Component Value Date   NA 136 09/23/2015   K 4.1 09/23/2015   CL 102 09/23/2015   CO2 27 09/23/2015   BUN 11 09/23/2015   CREATININE 0.47 09/23/2015   GLUCOSE 129* 09/23/2015    Discharge Medications:     Medication List    TAKE these medications        amitriptyline 50 MG tablet  Commonly known as:  ELAVIL  Take 1 tablet (50 mg total) by mouth at bedtime. Reported on 09/16/2015     amphetamine-dextroamphetamine 30 MG tablet  Commonly known as:  ADDERALL  Take 30 mg by mouth 2 (two) times daily.     ARIPiprazole 15 MG tablet  Commonly known as:  ABILIFY  Take 15 mg by mouth at bedtime.     aspirin EC 325 MG tablet  Take 1 tablet (325 mg total) by mouth daily.     busPIRone 10 MG tablet  Commonly known as:  BUSPAR  Take 20 mg by mouth 2 (two) times daily.     busPIRone 30 MG tablet  Commonly known as:  BUSPAR     carisoprodol 350 MG tablet  Commonly known as:  SOMA  Take 1 tablet (350 mg total) by mouth at bedtime.     clonazePAM 1 MG tablet  Commonly known as:  KLONOPIN  Take 1-2 mg by mouth 3 (three) times daily as needed for anxiety.     dexlansoprazole 60 MG capsule  Commonly known as:  DEXILANT  Take 1 capsule (60 mg total) by mouth daily.     INCRUSE ELLIPTA 62.5 MCG/INH Aepb  Generic drug:  umeclidinium bromide  Inhale into the lungs. Reported on 09/22/2015     Ipratropium-Albuterol 20-100 MCG/ACT Aers respimat  Commonly known as:  COMBIVENT  Inhale 1 puff into the lungs every 6 (six) hours.     magic mouthwash w/lidocaine Soln  Take 15 mLs by mouth 4 (four) times daily.     nabumetone 750 MG tablet  Commonly known as:  RELAFEN   Take 750 mg by mouth 2 (two) times daily as needed. Reported on 09/22/2015     oxyCODONE 5 MG immediate release tablet  Commonly known as:  Oxy IR/ROXICODONE  Reported on 09/22/2015     Oxycodone HCl 10 MG Tabs  Take 1 tablet (10 mg total) by mouth 3 (three) times daily before meals.     oxyCODONE 5 MG immediate release tablet  Commonly known as:  Oxy IR/ROXICODONE  Take 1-2 tablets (5-10 mg total)  by mouth every 4 (four) hours as needed for breakthrough pain.     sertraline 50 MG tablet  Commonly known as:  ZOLOFT  Take 100 mg by mouth 2 (two) times daily.     sertraline 100 MG tablet  Commonly known as:  ZOLOFT  Reported on 09/22/2015     tiotropium 18 MCG inhalation capsule  Commonly known as:  SPIRIVA  Place 1 capsule (18 mcg total) into inhaler and inhale daily.     traZODone 150 MG tablet  Commonly known as:  DESYREL  Take 300 mg by mouth at bedtime as needed for sleep.        Diagnostic Studies: No results found.  Disposition: 01-Home or Self Care        Follow-up Information    Follow up with MENZ,MICHAEL, MD In 2 weeks.   Specialty:  Orthopedic Surgery   Why:  For staple removal and skin check   Contact information:   [address] [place] [address] [phone]        Signed: Feliberto Gottron 09/23/2015, 2:10 PM

## 2015-09-23 NOTE — Progress Notes (Signed)
OT Cancellation Note  Patient Details Name: Wendy Johnson MRN: [id number] DOB: 01-Jan-1966   Cancelled Treatment:    Reason Eval/Treat Not Completed: Fatigue/lethargy limiting ability to participate   Harrel Carina, MS, OTR/L  Harrel Carina 09/23/2015, 12:05 PM

## 2015-09-23 NOTE — Evaluation (Signed)
Occupational Therapy Evaluation Patient Details Name: Wendy Johnson MRN: [id number] DOB: [birth year] Today's Date: 09/23/2015    History of Present Illness Pt is a 50 y.o. female s/p R total knee revision/revise polyiethylene 09/22/15 secondary to instability.  PMH includes s/p R THA 05/12/15 and L THA 12/16/15; also OCD, chronic back pain, sleep apnea, and ADHD.   Clinical Impression   Pt. Is a 50 y.o. Female who was admitted with a RIght TKRevision. Pt. Pt. Reports this being her 5th surgery. Pt. Is min guard for toileting, and for functional mobility during ADLs. Pt. Is able to demonstrate proper use of the A/E for LE ADLs. Pt. Reports having all the equipment she needs at this time. Pt. has a solid understanding of the A/E, and how th use it. Pt. Has family support to assist when she returns home. No further OT services are warranted at this time.    Follow Up Recommendations  No OT follow up    Equipment Recommendations       Recommendations for Other Services       Precautions / Restrictions Precautions Precautions: Fall;Knee Precaution Booklet Issued: Yes (comment) Restrictions Weight Bearing Restrictions: Yes RLE Weight Bearing: Weight bearing as tolerated      Mobility Bed Mobility Overal bed mobility: Needs Assistance Bed Mobility: Sit to Supine       Sit to supine: Min assist   General bed mobility comments: assist for R LE; use of side rail  Transfers Overall transfer level: Needs assistance Equipment used: Rolling walker (2 wheeled) Transfers: Sit to/from [facility] Sit to Stand: Min guard Stand pivot transfers: Min guard (transfer to commode over toilet)       General transfer comment: minimal vc's for hand and feet placement required    Balance Overall balance assessment: Needs assistance Sitting-balance support: Bilateral upper extremity supported;Feet supported Sitting balance-Leahy Scale: Good     Standing balance support:  Bilateral upper extremity supported (on RW) Standing balance-Leahy Scale: Good                              ADL Overall ADL's : Needs assistance/impaired                     Lower Body Dressing: Minimal assistance   Toilet Transfer: Min guard           Functional mobility during ADLs: Min guard       Vision     Perception     Praxis      Pertinent Vitals/Pain Pain Assessment: 0-10 Pain Score: 8  Pain Location: R knee Pain Descriptors / Indicators: Sore;Tender;Operative site guarding;Grimacing;Guarding Pain Intervention(s): Limited activity within patient's tolerance;Monitored during session;Premedicated before session;Repositioned;Ice applied     Hand Dominance Right   Extremity/Trunk Assessment         Cervical / Trunk Assessment Cervical / Trunk Assessment: Normal   Communication Communication Communication: No difficulties   Cognition Arousal/Alertness: Lethargic Behavior During Therapy: WFL for tasks assessed/performed Overall Cognitive Status: Within Functional Limits for tasks assessed                     General Comments       Exercises       Shoulder Instructions      Home Living Family/patient expects to be discharged to:: Private residence Living Arrangements: Spouse/significant other Available Help at Discharge: Family;Friend(s) Type of Home: House Home Access:  Stairs to enter [employer] of Steps: 4 Entrance Stairs-Rails: Left Home Layout: One level     Bathroom Shower/Tub: Walk-in shower;Door     Bathroom Accessibility: Yes   Home Equipment: Walker - 2 wheels;Bedside commode;Cane - quad;Shower seat          Prior Functioning/Environment Level of Independence: Independent        Comments: Pt. reports this is the 5th surgery she has had on the knee.    OT Diagnosis: Generalized weakness   OT Problem List:  (Pt. has good understanding of A/E.)   OT Treatment/Interventions:       OT Goals(Current goals can be found in the care plan section) Acute Rehab OT Goals Patient Stated Goal: To go home OT Goal Formulation: With patient Potential to Achieve Goals: Good  OT Frequency:     Barriers to D/C:            Co-evaluation              End of Session Equipment Utilized During Treatment: Gait belt;Rolling walker  Activity Tolerance: Patient tolerated treatment well Patient left: with call bell/phone within reach;with family/visitor present (After toileting, pt. was seen for the remainder of the session at the EOB. Pt. refused to get back in bed, or sit in the chair following the session. Pt. reports she was not ready to get back in bed, and wanted to remain sitting at the EOB for a while to visit with her company. Pt. nurse was notified. )   [id number] OT Time Calculation (min): 20 min Charges:  OT General Charges $OT Visit: 1 Procedure OT Evaluation $OT Eval Low Complexity: 1 Procedure G-Codes:    Harrel Carina, MS, OTR/L Harrel Carina 09/23/2015, 4:51 PM

## 2015-09-23 NOTE — Progress Notes (Signed)
Clinical Social Worker (CSW) received SNF consult. PT is recommending home health. RN Case Manager is aware of above. Please reconsult if future social work needs arise. CSW signing off.   Andri Prestia Morgan, LCSW [phone] 

## 2015-09-23 NOTE — Progress Notes (Signed)
Pt's O2 saturation ranged from 84-86% on room air. O2 inhalation at 2Lpm administered via nasal cannula. Saturations went up to 92%. Pt not in any respiratory distress. Will continue to monitor.

## 2015-09-23 NOTE — Evaluation (Signed)
Physical Therapy Evaluation Patient Details Name: Wendy Johnson MRN: [id number] DOB: 1966/03/05 Today's Date: 09/23/2015   History of Present Illness  Pt is a 50 y.o. female s/p R total knee revision/revise polyiethylene 09/22/15 secondary to instability.  PMH includes s/p R THA 05/12/15 and L THA 12/16/15; also OCD, chronic back pain, sleep apnea, and ADHD.  Clinical Impression  Prior to admission, pt was independent ambulating without AD.  Pt lives with her boyfriend in 1 level home with stairs to enter.  Currently pt is min assist supine to sit and CGA with transfers and short ambulation distance bed to recliner with RW.  Limited distance ambulated d/t R knee pain (of note, pt lethargic beginning of session and fell back asleep end of session in recliner; nursing reporting pt pre-medicated with pain meds).  Pt would benefit from skilled PT to address noted impairments and functional limitations.  Recommend pt discharge to home with 24/7 assist (pt's sister plans to provide this) and HHPT when medically appropriate.     Follow Up Recommendations Home health PT;Supervision/Assistance - 24 hour    Equipment Recommendations       Recommendations for Other Services       Precautions / Restrictions Precautions Precautions: Fall;Knee Precaution Booklet Issued: Yes (comment) Restrictions Weight Bearing Restrictions: Yes RLE Weight Bearing: Weight bearing as tolerated      Mobility  Bed Mobility Overal bed mobility: Needs Assistance Bed Mobility: Supine to Sit     Supine to sit: Min assist;HOB elevated     General bed mobility comments: assist for R LE; use of side rail  Transfers Overall transfer level: Needs assistance Equipment used: Rolling walker (2 wheeled) Transfers: Sit to/from Stand Sit to Stand: Min guard         General transfer comment: minimal vc's for hand and feet placement required  Ambulation/Gait Ambulation/Gait assistance: Min guard Ambulation Distance  (Feet): 3 Feet (bed to recliner) Assistive device: Rolling walker (2 wheeled)   Gait velocity: decreased   General Gait Details: antalgic; decreased stance time R LE; increased UE support through RW noted  Stairs            Wheelchair Mobility    Modified Rankin (Stroke Patients Only)       Balance Overall balance assessment: Needs assistance Sitting-balance support: Bilateral upper extremity supported;Feet supported Sitting balance-Leahy Scale: Good     Standing balance support: Bilateral upper extremity supported (on RW) Standing balance-Leahy Scale: Good                               Pertinent Vitals/Pain Pain Assessment: 0-10 Pain Score: 9  Pain Location: R knee Pain Descriptors / Indicators: Sore;Tender;Operative site guarding;Grimacing;Guarding Pain Intervention(s): Limited activity within patient's tolerance;Monitored during session;Premedicated before session;Repositioned;Ice applied    Home Living Family/patient expects to be discharged to:: Private residence Living Arrangements: Spouse/significant other (Boyfriend) Available Help at Discharge: Family;Friend(s);Available 24 hours/day (Pt's sister plans to assist 24/7) Type of Home: House Home Access: Stairs to enter Entrance Stairs-Rails: Left Entrance Stairs-Number of Steps: 4 Home Layout: One level Home Equipment: Walker - 2 wheels;Bedside commode;Cane - quad;Shower seat      Prior Function Level of Independence: Independent         Comments: Pt denies any falls in past 6 months.  Per prior PT eval, pt does not drive.     Hand Dominance   Dominant Hand: Right    Extremity/Trunk Assessment  Upper Extremity Assessment: Overall WFL for tasks assessed           Lower Extremity Assessment: RLE deficits/detail;LLE deficits/detail RLE Deficits / Details: R hip flexion at least 3-/5; R knee flexion/extension at least 2+/5; R DF at least 4/5.  Limited assessment d/t R knee  pain. LLE Deficits / Details: L LE strength and ROM WFL  Cervical / Trunk Assessment: Normal  Communication   Communication: No difficulties  Cognition Arousal/Alertness: Lethargic (Required vc's to wake up and then increased alertness noted) Behavior During Therapy: WFL for tasks assessed/performed Overall Cognitive Status: Within Functional Limits for tasks assessed                      General Comments General comments (skin integrity, edema, etc.): R knee dressings in place  Nursing cleared pt for participation in physical therapy.  Pt agreeable to PT session.  Pt's sister present part of session.    Exercises Total Joint Exercises Ankle Circles/Pumps: AROM;Strengthening;Both;10 reps;Supine Quad Sets:  (pt falling asleep when attempting so deferred) Short Arc Quad: Strengthening;Both;10 reps;Supine (AAROM R; AROM L) Heel Slides: Strengthening;Both;10 reps;Supine (AAROM R; AROM L) Hip ABduction/ADduction: Strengthening;Both;10 reps;Supine (AAROM R; AROM L) Goniometric ROM: R knee extension 15 degrees short of neutral semi-supine in bed; R knee flexion 65 degrees sitting in chair      Assessment/Plan    PT Assessment Patient needs continued PT services  PT Diagnosis Difficulty walking;Acute pain   PT Problem List Decreased strength;Decreased range of motion;Decreased activity tolerance;Decreased balance;Decreased mobility;Pain  PT Treatment Interventions DME instruction;Gait training;Stair training;Functional mobility training;Therapeutic activities;Therapeutic exercise;Balance training;Patient/family education   PT Goals (Current goals can be found in the Care Plan section) Acute Rehab PT Goals Patient Stated Goal: to go home PT Goal Formulation: With patient/family Time For Goal Achievement: 10/07/15 Potential to Achieve Goals: Good    Frequency BID   Barriers to discharge        Co-evaluation               End of Session Equipment Utilized During  Treatment: Gait belt;Oxygen Activity Tolerance: Patient limited by pain Patient left: in chair;with call bell/phone within reach;with chair alarm set;with family/visitor present;with SCD's reapplied (B heels elevated via towel rolls and polar care in place and activated) Nurse Communication: Mobility status;Precautions         Time: [id number] PT Time Calculation (min) (ACUTE ONLY): 33 min   Charges:   PT Evaluation $PT Eval Low Complexity: 1 Procedure PT Treatments $Therapeutic Exercise: 8-22 mins   PT G CodesLeitha Bleak 10/20/2015, 12:31 PM Leitha Bleak, [place]

## 2015-09-24 LAB — BASIC METABOLIC PANEL
ANION GAP: 7 (ref 5–15)
BUN: 6 mg/dL (ref 6–20)
CALCIUM: 8.3 mg/dL — AB (ref 8.9–10.3)
CHLORIDE: 100 mmol/L — AB (ref 101–111)
CO2: 27 mmol/L (ref 22–32)
CREATININE: 0.5 mg/dL (ref 0.44–1.00)
GFR calc non Af Amer: >60 mL/min (ref >60)
Glucose, Bld: 114 mg/dL — ABNORMAL HIGH (ref 65–99)
Potassium: 3.4 mmol/L — ABNORMAL LOW (ref 3.5–5.1)
SODIUM: 134 mmol/L — AB (ref 135–145)

## 2015-09-24 LAB — CBC
HEMATOCRIT: 32.9 % — AB (ref 35.0–47.0)
HEMOGLOBIN: 10.6 g/dL — AB (ref 12.0–16.0)
MCH: 24.3 pg — ABNORMAL LOW (ref 26.0–34.0)
MCHC: 32.3 g/dL (ref 32.0–36.0)
MCV: 75.2 fL — ABNORMAL LOW (ref 80.0–100.0)
Platelets: 179 10*3/uL (ref 150–440)
RBC: 4.37 MIL/uL (ref 3.80–5.20)
RDW: 17 % — AB (ref 11.5–14.5)
WBC: 7.4 10*3/uL (ref 3.6–11.0)

## 2015-09-24 NOTE — Care Management Note (Signed)
Case Management Note  Patient Details  Name: Wendy Johnson MRN: [id number] Date of Birth: [birth year]  Subjective/Objective:       For Discharge home today with HH-PT. Text to Floydene Flock at [facility] of this referral. [name] has a RW, BSC, 4 pronged cane. Chronic home oxygen.              Action/Plan:   Expected Discharge Date:                  Expected Discharge Plan:     In-House Referral:     Discharge planning Services  CM Consult  Post Acute Care Choice:  Home Health Choice offered to:  Patient  DME Arranged:    DME Agency:     HH Arranged:    [place] Agency:  [place]  Status of Service:  In process, will continue to follow  Medicare Important Message Given:  Yes Date Medicare IM Given:    Medicare IM give by:    Date Additional Medicare IM Given:    Additional Medicare Important Message give by:     If discussed at [place] of Stay Meetings, dates discussed:    Additional Comments:  Kaci Dillie A, RN 09/24/2015, 7:30 AM

## 2015-09-24 NOTE — Care Management Important Message (Signed)
Important Message  Patient Details  Name: Wendy Johnson MRN: [id number] Date of Birth: 04-22-1966   Medicare Important Message Given:  Yes    Marcell Pfeifer A, RN 09/24/2015, 7:02 AM

## 2015-09-24 NOTE — Progress Notes (Signed)
   Subjective: 2 Days Post-Op Procedure(s) (LRB): TOTAL KNEE REVISION/ REVISE POLYIETHYLENE (Right) Patient reports pain as mild.  Patient is well, and has had no acute complaints or problems Denies any CP, SOB, ABD pain. We will continue therapy today.  Plan is to go Home after hospital stay.  Objective: Vital signs in last 24 hours: Temp:  [97.7 F (36.5 C)-98.3 F (36.8 C)] 98.1 F (36.7 C) (05/18 0431) Pulse Rate:  [77-119] 99 (05/18 0431) Resp:  [15-18] 17 (05/18 0431) BP: (100-158)/(58-101) 128/78 mmHg (05/18 0431) SpO2:  [83 %-95 %] 92 % (05/18 0431)  Intake/Output from previous day:   Intake/Output this shift:     Recent Labs  09/22/15 1558 09/23/15 0404 09/24/15 0411  HGB 11.0* 11.0* 10.6*    Recent Labs  09/23/15 0404 09/24/15 0411  WBC 10.5 7.4  RBC 4.62 4.37  HCT 34.3* 32.9*  PLT 195 179    Recent Labs  09/23/15 0404 09/24/15 0411  NA 136 134*  K 4.1 3.4*  CL 102 100*  CO2 27 27  BUN 11 6  CREATININE 0.47 0.50  GLUCOSE 129* 114*  CALCIUM 8.6* 8.3*   No results for input(s): LABPT, INR in the last 72 hours.  EXAM General - Patient is Alert, Appropriate and Oriented. Patient sleeping upon entering room.  Extremity - Neurovascular intact Sensation intact distally Intact pulses distally Dorsiflexion/Plantar flexion intact No cellulitis present Dressing - dressing C/D/I and no drainage, dressing changed Motor Function - intact, moving foot and toes well on exam.   Past Medical History  Diagnosis Date  . High cholesterol   . GERD (gastroesophageal reflux disease)   . Anxiety   . Depression   . OCD (obsessive compulsive disorder)   . Chronic back pain   . Chronic constipation   . Severe obesity ([place])   . AR (allergic rhinitis)   . Trochanteric bursitis of right hip   . Tobacco use   . Hepatomegaly   . Dyslipidemia   . COPD (chronic obstructive pulmonary disease) ([place])   . Decreased dorsalis pedis pulse   . Fatty liver   .  Chronic insomnia   . Benign hypertension   . Deaf   . Hot flashes   . Migraine with aura   . Arthritis   . Plantar warts   . Sleep apnea     C-PAP  . Shortness of breath dyspnea   . ADHD (attention deficit hyperactivity disorder)     Assessment/Plan:   2 Days Post-Op Procedure(s) (LRB): TOTAL KNEE REVISION/ REVISE POLYIETHYLENE (Right) Active Problems:   Instability of prosthetic knee (HCC)  Estimated body mass index is 41.35 kg/(m^2) as calculated from the following:   Height as of this encounter: 5\' 4"  (1.626 m).   Weight as of this encounter: 109.317 kg (241 lb). Advance diet Up with therapy  Discharge home with HHPT today pending progress with PT Follow up with [facility] in 2 weeks  DVT Prophylaxis - Lovenox, Foot Pumps and TED hose Weight-Bearing as tolerated to right leg D/C O2 and Pulse OX and try on Room Air  T. Rachelle Hora, PA-C [place] 09/24/2015, 7:17 AM

## 2015-09-24 NOTE — Progress Notes (Addendum)
Physical Therapy Treatment Patient Details Name: Wendy Johnson MRN: [id number] DOB: [birth year] Today's Date: 09/24/2015    History of Present Illness Pt is a 50 y.o. female s/p R total knee revision/revise polyiethylene 09/22/15 secondary to instability.  PMH includes s/p R THA 05/12/15 and L THA 12/16/15; also OCD, chronic back pain, sleep apnea, and ADHD.    PT Comments    Pt ready for session.  Stated she is being D/C this morning.  Ambulated to/from PT gym 200' and was able to ambulate up/down 4 steps with bilateral rails and verbal reminders of gait sequence.  She participated in exercises as described below. PT stated she is confident with D/C plan and has no further questions or concerns.    Follow Up Recommendations  Home health PT;Supervision/Assistance - 24 hour     Equipment Recommendations       Recommendations for Other Services       Precautions / Restrictions Restrictions Weight Bearing Restrictions: Yes RLE Weight Bearing: Weight bearing as tolerated    Mobility  Bed Mobility Overal bed mobility: Modified Independent Bed Mobility: Sit to Supine     Supine to sit: Modified independent (Device/Increase time)     General bed mobility comments: uses rail  Transfers Overall transfer level: Modified independent Equipment used: Rolling walker (2 wheeled) Transfers: Sit to/from Stand Sit to Stand: Modified independent (Device/Increase time) Stand pivot transfers: Modified independent (Device/Increase time)       General transfer comment: good safety, no lob  Ambulation/Gait Ambulation/Gait assistance: Supervision Ambulation Distance (Feet): 200 Feet Assistive device: Rolling walker (2 wheeled) Gait Pattern/deviations: Step-to pattern;Decreased step length - left Gait velocity: decreased Gait velocity interpretation: <1.8 ft/sec, indicative of risk for recurrent falls     Stairs Stairs: Yes Stairs assistance: Supervision Stair Management: Two  rails;Step to pattern Number of Stairs: 4 General stair comments: does well  Wheelchair Mobility    Modified Rankin (Stroke Patients Only)       Balance Overall balance assessment: Modified Independent Sitting-balance support: Feet supported Sitting balance-Leahy Scale: Normal     Standing balance support: Bilateral upper extremity supported Standing balance-Leahy Scale: Good                      Cognition Arousal/Alertness: Awake/alert Behavior During Therapy: WFL for tasks assessed/performed Overall Cognitive Status: Within Functional Limits for tasks assessed                      Exercises Total Joint Exercises Ankle Circles/Pumps: AROM;Strengthening;Both;10 reps;Supine Quad Sets: AROM;10 reps;Right;Supine Short Arc Quad: AROM;Right;10 reps;Supine Heel Slides: AAROM;Right;15 reps;Supine Straight Leg Raises: AAROM;Right;10 reps;Supine Long Arc Quad: AAROM;Right;10 reps;Seated Knee Flexion: AAROM;Right;10 reps;Seated Goniometric ROM: 80    General Comments        Pertinent Vitals/Pain Pain Assessment: 0-10 Pain Score: 5  Pain Location: R knee Pain Descriptors / Indicators: Aching Pain Intervention(s): Patient requesting pain meds-RN notified    Home Living                      Prior Function            PT Goals (current goals can now be found in the care plan section) Acute Rehab PT Goals Patient Stated Goal: To go home Progress towards PT goals: Progressing toward goals    Frequency  BID    PT Plan Current plan remains appropriate    Co-evaluation  End of Session Equipment Utilized During Treatment: Gait belt Activity Tolerance: Patient tolerated treatment well Patient left: in bed     Time: 0830-0857 PT Time Calculation (min) (ACUTE ONLY): 27 min  Charges:  $Gait Training: 8-22 mins $Therapeutic Exercise: 8-22 mins                    G Codes:      Chesley Noon, PTA 09/24/2015, 9:01 AM      Addendum:  O2 sats 97% at rest on room air.  92% upon return to room on room air.

## 2015-09-25 LAB — WOUND CULTURE: CULTURE: NO GROWTH

## 2015-09-29 ENCOUNTER — Telehealth: Payer: Self-pay | Admitting: Family Medicine

## 2015-09-29 LAB — ANAEROBIC CULTURE

## 2015-09-29 NOTE — Telephone Encounter (Signed)
Was told that you was going to prescribed (2) 50mg  of Elavil daily. But that is not what was sent to the pharmacy. [facility] and give patient a call to discuss this

## 2015-09-29 NOTE — Telephone Encounter (Signed)
She was taking 25 mg and I increased to 50 mg dose , on my note we recommended decrease caffeine intake and if no improvement of headaches refer to headache clinic

## 2015-09-30 ENCOUNTER — Telehealth: Payer: Self-pay

## 2015-09-30 NOTE — Telephone Encounter (Signed)
Patient was informed of Dr. Ancil Boozer' message and was asked if her headaches continues. She said yes, but that she has an appt on 10/12/15 with the neurologist. I told her that we will wait on the referral to the headache clinic to see what the neurologist has to say and she said ok.  Steele Sizer, MD at 09/29/2015 9:39 PM She was taking 25 mg and I increased to 50 mg dose , on my note we recommended decrease caffeine intake and if no improvement of headaches refer to headache clinic  Samson Frederic at 09/29/2015 12:24 PM  Was told that you was going to prescribed (2) 50mg  of Elavil daily. But that is not what was sent to the pharmacy. [facility] and give patient a call to discuss this

## 2015-10-01 ENCOUNTER — Telehealth: Payer: Self-pay | Admitting: Family Medicine

## 2015-10-01 DIAGNOSIS — R309 Painful micturition, unspecified: Secondary | ICD-10-CM | POA: Diagnosis not present

## 2015-10-01 NOTE — Telephone Encounter (Signed)
eRRENOUS

## 2015-10-02 ENCOUNTER — Encounter: Payer: Self-pay | Admitting: Family Medicine

## 2015-10-02 ENCOUNTER — Ambulatory Visit (INDEPENDENT_AMBULATORY_CARE_PROVIDER_SITE_OTHER): Payer: Medicare Other | Admitting: Family Medicine

## 2015-10-02 VITALS — BP 114/76 | HR 106 | Temp 98.1°F | Resp 16 | Ht 64.0 in | Wt 241.0 lb

## 2015-10-02 DIAGNOSIS — R309 Painful micturition, unspecified: Secondary | ICD-10-CM

## 2015-10-02 LAB — POCT URINALYSIS DIPSTICK
BILIRUBIN UA: NEGATIVE
Blood, UA: NEGATIVE
Glucose, UA: NEGATIVE
Ketones, UA: NEGATIVE
NITRITE UA: NEGATIVE
PH UA: 6
PROTEIN UA: NEGATIVE
Spec Grav, UA: 1.015
UROBILINOGEN UA: 0.2

## 2015-10-02 NOTE — Progress Notes (Signed)
Name: Wendy Johnson   MRN: [id number]    DOB: [birth year]   Date:10/02/2015       Progress Note  Subjective  Chief Complaint  Chief Complaint  Patient presents with  . Urinary Tract Infection    patient presents with sx of a UTI for the past couple of days. she stated that it burn when she urinates and she has been itchy. she has tried otc vagisil but it has not helped.    HPI  Dysuria: she had right knee revision 10 days ago.  She was home for a few days and developed dysuria, mild increase in urinary frequency and also some pruritus around her urethra. No vaginal discharge or odor. She used some vagisil for 5 days and symptoms stopped yesterday afternoon. Feeling well now. No fever, no back or flank pain.   Patient Active Problem List   Diagnosis Date Noted  . Instability of prosthetic knee ([place]) 09/22/2015  . Primary osteoarthritis of right hip 05/12/2015  . Sleep apnea 04/07/2015  . Primary osteoarthritis of left hip 12/16/2014  . Metabolic syndrome 03/55/9741  . Migraine without aura and without status migrainosus, not intractable 11/26/2014  . GERD without esophagitis 11/26/2014  . COPD, moderate ([place]) 11/26/2014  . Nocturnal oxygen desaturation 11/26/2014  . Supplemental oxygen dependent 11/26/2014  . Hearing loss 11/26/2014  . Chronic insomnia 11/26/2014  . History of hypertension 11/26/2014  . Dyslipidemia 11/26/2014  . History of morbid obesity 11/26/2014  . History of diabetes mellitus, type II 11/26/2014  . Chronic constipation 11/26/2014  . Generalized anxiety disorder 11/11/2014  . DDD (degenerative disc disease), lumbar 11/11/2014  . Facet arthritis of lumbar region 11/11/2014  . Primary osteoarthritis involving multiple joints 11/11/2014  . H/O hysterectomy for benign disease 10/20/2014  . History of endometriosis 10/16/2014  . Low back derangement syndrome 09/30/2014  . Degenerative joint disease of both lower legs 09/30/2014  . Bipolar disorder ([place])  09/30/2014    Past Surgical History  Procedure Laterality Date  . Lipoma excision    . Knee arthroscopo Right   . Tonsillectomy    . Tubal ligation    . Laparoscopy  09/22/2014    Procedure: LAPAROSCOPY OPERATIVE;  Surgeon: Brayton Mars, MD;  Location: [facility] ORS;  Service: Gynecology;;  excision and fulgeration of endomertriosis  . Foot surgery Bilateral   . Bone spurs removed Bilateral   . Abdominal hysterectomy N/A 10/20/2014    Procedure: Total abdominial hysterectomy, bilateral salpingo-oophorectomy;  Surgeon: Brayton Mars, MD;  Location: [facility];  Service: Gynecology;  Laterality: N/A;  . Bilateral salpingoophorectomy    . Total hip arthroplasty Left 12/16/2014    Procedure: TOTAL HIP ARTHROPLASTY ANTERIOR APPROACH;  Surgeon: Hessie Knows, MD;  Location: [facility] OR[facility];  Service: Orthopedics;  Laterality: Left;  . Total hip arthroplasty Right 05/12/2015    Procedure: TOTAL HIP ARTHROPLASTY ANTERIOR APPROACH;  Surgeon: Hessie Knows, MD;  Location: [facility];  Service: Orthopedics;  Laterality: Right;  . Joint replacement Right     Total Knee replacement X 2  . Joint replacement Bilateral     Total Hip Replacement  . Total knee revision Right 09/22/2015    Procedure: TOTAL KNEE REVISION/ REVISE POLYIETHYLENE;  Surgeon: Hessie Knows, MD;  Location: [facility];  Service: Orthopedics;  Laterality: Right;    Family History  Problem Relation Age of Onset  . Diabetes Mother   . Heart disease Father   . Heart attack Father   . Diabetes Sister  Social History   Social History  . Marital Status: Divorced    Spouse Name: N/A  . Number of Children: N/A  . Years of Education: N/A   Occupational History  . Not on file.   Social History Main Topics  . Smoking status: Current Every Day Smoker -- 1.00 packs/day for 35 years    Types: Cigarettes    Start date: 11/26/1979  . Smokeless tobacco: Never Used  . Alcohol Use: No  . Drug Use: No     Comment: quit crack cocaine 2004   . Sexual Activity:    Partners: Male    Patent examiner Protection: None   Other Topics Concern  . Not on file   Social History Narrative     Current outpatient prescriptions:  .  amitriptyline (ELAVIL) 50 MG tablet, Take 1 tablet (50 mg total) by mouth at bedtime. Reported on 09/16/2015, Disp: 30 tablet, Rfl: 1 .  amphetamine-dextroamphetamine (ADDERALL) 30 MG tablet, Take 30 mg by mouth 2 (two) times daily., Disp: , Rfl:  .  ARIPiprazole (ABILIFY) 15 MG tablet, Take 15 mg by mouth at bedtime. , Disp: , Rfl:  .  aspirin EC 325 MG tablet, Take 1 tablet (325 mg total) by mouth daily., Disp: 30 tablet, Rfl: 0 .  busPIRone (BUSPAR) 10 MG tablet, Take 20 mg by mouth 2 (two) times daily. , Disp: , Rfl:  .  carisoprodol (SOMA) 350 MG tablet, Take 1 tablet (350 mg total) by mouth at bedtime., Disp: 30 tablet, Rfl: 2 .  clonazePAM (KLONOPIN) 1 MG tablet, Take 1-2 mg by mouth 3 (three) times daily as needed for anxiety., Disp: , Rfl:  .  dexlansoprazole (DEXILANT) 60 MG capsule, Take 1 capsule (60 mg total) by mouth daily., Disp: 30 capsule, Rfl: 5 .  Ipratropium-Albuterol (COMBIVENT) 20-100 MCG/ACT AERS respimat, Inhale 1 puff into the lungs every 6 (six) hours., Disp: 4 g, Rfl: 5 .  lidocaine (XYLOCAINE) 2 % solution, , Disp: , Rfl:  .  magic mouthwash w/lidocaine SOLN, Take 15 mLs by mouth 4 (four) times daily., Disp: 300 mL, Rfl: 1 .  nabumetone (RELAFEN) 750 MG tablet, Take 750 mg by mouth 2 (two) times daily as needed. Reported on 09/22/2015, Disp: , Rfl:  .  oxyCODONE (OXY IR/ROXICODONE) 5 MG immediate release tablet, Reported on 09/22/2015, Disp: , Rfl:  .  oxyCODONE (OXY IR/ROXICODONE) 5 MG immediate release tablet, Take 1-2 tablets (5-10 mg total) by mouth every 4 (four) hours as needed for breakthrough pain., Disp: 40 tablet, Rfl: 0 .  Oxycodone HCl 10 MG TABS, Take 1 tablet (10 mg total) by mouth 3 (three) times daily before meals., Disp: 105 tablet, Rfl: 0 .  sertraline (ZOLOFT) 100 MG  tablet, Reported on 09/22/2015, Disp: , Rfl:  .  sertraline (ZOLOFT) 50 MG tablet, Take 100 mg by mouth 2 (two) times daily. , Disp: , Rfl:  .  tiotropium (SPIRIVA) 18 MCG inhalation capsule, Place 1 capsule (18 mcg total) into inhaler and inhale daily., Disp: 30 capsule, Rfl: 5 .  traZODone (DESYREL) 150 MG tablet, Take 300 mg by mouth at bedtime as needed for sleep. , Disp: , Rfl:  .  umeclidinium bromide (INCRUSE ELLIPTA) 62.5 MCG/INH AEPB, Inhale into the lungs. Reported on 09/22/2015, Disp: , Rfl:   Allergies  Allergen Reactions  . Codeine Nausea Only  . Imitrex [Sumatriptan] Other (See Comments)    Chest pain     ROS  Ten systems reviewed and is negative except  as mentioned in HPI  She has pain on right knee, staples still present  Objective  Filed Vitals:   10/02/15 0749  BP: 114/76  Pulse: 106  Temp: 98.1 F (36.7 C)  TempSrc: Oral  Resp: 16  Height: 5' 4"  (1.626 m)  Weight: 241 lb (109.317 kg)  SpO2: 97%    Body mass index is 41.35 kg/(m^2).  Physical Exam  Constitutional: Patient appears well-developed and well-nourished. Obese  No distress.  HEENT: head atraumatic, normocephalic, pupils equal and reactive to light, neck supple, throat within normal limits Cardiovascular: Normal rate, regular rhythm and normal heart sounds.  No murmur heard. No BLE edema. Pulmonary/Chest: Effort normal and breath sounds normal. No respiratory distress. Abdominal: Soft.  There is no tenderness. Negative CVA tenderness Psychiatric: Patient has a normal mood and affect. behavior is normal. Judgment and thought content normal.  Recent Results (from the past 2160 hour(s))  Lipid Profile     Status: Abnormal   Collection Time: 07/08/15  8:55 AM  Result Value Ref Range   Cholesterol, Total 218 (H) 100 - 199 mg/dL   Triglycerides 227 (H) 0 - 149 mg/dL   HDL 44 >39 mg/dL   VLDL Cholesterol Cal 45 (H) 5 - 40 mg/dL   LDL Calculated 129 (H) 0 - 99 mg/dL   Chol/HDL Ratio 5.0 (H) 0.0 -  4.4 ratio units    Comment:                                   T. Chol/HDL Ratio                                             Men  Women                               1/2 Avg.Risk  3.4    3.3                                   Avg.Risk  5.0    4.4                                2X Avg.Risk  9.6    7.1                                3X Avg.Risk 23.4   11.0   ToxASSURE Select 13 (MW), Urine     Status: None   Collection Time: 07/22/15  2:45 PM  Result Value Ref Range   ToxAssure Select 13 FINAL     Comment: ==================================================================== TOXASSURE SELECT 13 (MW) ==================================================================== Test                             Result       Flag       Units Drug Present and Declared for Prescription Verification   7-aminoclonazepam              552          EXPECTED  ng/mg creat    7-aminoclonazepam is an expected metabolite of clonazepam. Source    of clonazepam is a scheduled prescription medication.   Oxycodone                      1848         EXPECTED   ng/mg creat   Oxymorphone                    1316         EXPECTED   ng/mg creat   Noroxycodone                   1745         EXPECTED   ng/mg creat   Noroxymorphone                 474          EXPECTED   ng/mg creat    Sources of oxycodone are scheduled prescription medications.    Oxymorphone, noroxycodone, and noroxymorphone are expected    metabolites of oxycodone. Oxymorphone is also available as a    scheduled prescription medication. Dru g Absent but Declared for Prescription Verification   Amphetamine                    Not Detected UNEXPECTED ng/mg creat ==================================================================== Test                      Result    Flag   Units      Ref Range   Creatinine              31               mg/dL      >=20 ==================================================================== Declared Medications:  The flagging  and interpretation on this report are based on the  following declared medications.  Unexpected results may arise from  inaccuracies in the declared medications.  **Note: The testing scope of this panel includes these medications:  Amphetamine (Adderall)  Clonazepam (Klonopin)  Oxycodone  **Note: The testing scope of this panel does not include following  reported medications:  Albuterol (Combivent)  Amitriptyline  Amitriptyline (Elavil)  Aripiprazole (Abilify)  Buspirone (BuSpar)  Carisoprodol (Soma)  Dexlansoprazole (Dexilant)  Ibuprofen (Advil)  Ipr atropium (Combivent)  Nabumetone (Relafen)  Sertraline (Zoloft)  Tiotropium (Spiriva)  Trazodone (Desyrel) ==================================================================== For clinical consultation, please call [phone]. ====================================================================    PDF .   Surgical pcr screen     Status: None   Collection Time: 09/09/15  8:27 AM  Result Value Ref Range   MRSA, PCR NEGATIVE NEGATIVE   Staphylococcus aureus NEGATIVE NEGATIVE    Comment:        The Xpert SA Assay (FDA approved for NASAL specimens in patients over 32 years of age), is one component of a comprehensive surveillance program.  Test performance has been validated by [facility] for patients greater than or equal to 61 year old. It is not intended to diagnose infection nor to guide or monitor treatment.   CBC     Status: Abnormal   Collection Time: 09/09/15  8:27 AM  Result Value Ref Range   WBC 5.7 3.6 - 11.0 K/uL   RBC 4.84 3.80 - 5.20 MIL/uL   Hemoglobin 11.5 (L) 12.0 - 16.0 g/dL   HCT 35.4 35.0 - 47.0 %   MCV 73.2 (L) 80.0 - 100.0 fL  MCH 23.7 (L) 26.0 - 34.0 pg   MCHC 32.4 32.0 - 36.0 g/dL   RDW 17.7 (H) 11.5 - 14.5 %   Platelets 233 150 - 440 K/uL  Basic metabolic panel     Status: None   Collection Time: 09/09/15  8:27 AM  Result Value Ref Range   Sodium 137 135 - 145 mmol/L   Potassium  4.0 3.5 - 5.1 mmol/L   Chloride 103 101 - 111 mmol/L   CO2 24 22 - 32 mmol/L   Glucose, Bld 66 65 - 99 mg/dL   BUN 16 6 - 20 mg/dL   Creatinine, Ser 0.69 0.44 - 1.00 mg/dL   Calcium 9.5 8.9 - 10.3 mg/dL   GFR calc non Af Amer >60 >60 mL/min   GFR calc Af Amer >60 >60 mL/min    Comment: (NOTE) The eGFR has been calculated using the CKD EPI equation. This calculation has not been validated in all clinical situations. eGFR's persistently <60 mL/min signify possible Chronic Kidney Disease.    Anion gap 10 5 - 15  Protime-INR     Status: None   Collection Time: 09/09/15  8:27 AM  Result Value Ref Range   Prothrombin Time 13.1 11.4 - 15.0 seconds   INR 0.97   APTT     Status: None   Collection Time: 09/09/15  8:27 AM  Result Value Ref Range   aPTT 28 24 - 36 seconds  Urine culture     Status: Abnormal   Collection Time: 09/09/15  8:27 AM  Result Value Ref Range   Specimen Description URINE, CLEAN CATCH    Special Requests NONE    Culture MULTIPLE SPECIES PRESENT, SUGGEST RECOLLECTION (A)    Report Status 09/11/2015 FINAL   Type and screen [facility]     Status: None   Collection Time: 09/09/15  8:27 AM  Result Value Ref Range   ABO/RH(D) A POS    Antibody Screen NEG    Sample Expiration 09/23/2015    Extend sample reason NO TRANSFUSIONS OR PREGNANCY IN THE PAST 3 MONTHS   Sedimentation rate     Status: Abnormal   Collection Time: 09/09/15  8:27 AM  Result Value Ref Range   Sed Rate 30 (H) 0 - 20 mm/hr  Urinalysis complete, with microscopic ([facility] only)     Status: Abnormal   Collection Time: 09/09/15  8:27 AM  Result Value Ref Range   Color, Urine STRAW (A) YELLOW   APPearance CLEAR (A) CLEAR   Glucose, UA NEGATIVE NEGATIVE mg/dL   Bilirubin Urine NEGATIVE NEGATIVE   Ketones, ur NEGATIVE NEGATIVE mg/dL   Specific Gravity, Urine 1.014 1.005 - 1.030   Hgb urine dipstick NEGATIVE NEGATIVE   pH 7.0 5.0 - 8.0   Protein, ur NEGATIVE NEGATIVE mg/dL    Nitrite NEGATIVE NEGATIVE   Leukocytes, UA NEGATIVE NEGATIVE   RBC / HPF 0-5 0 - 5 RBC/hpf   WBC, UA 0-5 0 - 5 WBC/hpf   Bacteria, UA NONE SEEN NONE SEEN   Squamous Epithelial / LPF 0-5 (A) NONE SEEN  ToxASSURE Select 13 (MW), Urine     Status: None   Collection Time: 09/16/15  1:15 PM  Result Value Ref Range   ToxAssure Select 13 FINAL     Comment: ==================================================================== TOXASSURE SELECT 13 (MW) ==================================================================== Specimen Alert Note:  Urinary creatinine is low; ability to detect some drugs may be compromised.  Interpret results with caution. ==================================================================== Test  Result       Flag       Units Drug Present and Declared for Prescription Verification   7-aminoclonazepam              700          EXPECTED   ng/mg creat    7-aminoclonazepam is an expected metabolite of clonazepam. Source    of clonazepam is a scheduled prescription medication.   Oxycodone                      2800         EXPECTED   ng/mg creat   Oxymorphone                    1580         EXPECTED   ng/mg creat   Noroxycodone                   5280         EXPECTED   ng/mg creat   Noroxymorphone                 990          EXPECTED   ng/mg creat    Sources of oxycodone are [place] heduled prescription medications.    Oxymorphone, noroxycodone, and noroxymorphone are expected    metabolites of oxycodone. Oxymorphone is also available as a    scheduled prescription medication. Drug Absent but Declared for Prescription Verification   Amphetamine                    Not Detected UNEXPECTED ng/mg creat ==================================================================== Test                      Result    Flag   Units      Ref Range   Creatinine              10        L      mg/dL       >=20 ==================================================================== Declared Medications:  The flagging and interpretation on this report are based on the  following declared medications.  Unexpected results may arise from  inaccuracies in the declared medications.  **Note: The testing scope of this panel includes these medications:  Amphetamine (Adderall)  Clonazepam (Klonopin)  Oxycodone  Oxycodone (Oxy-IR)  **Note: The testing scope of this panel does not inc lude following  reported medications:  Albuterol (Combivent)  Amitriptyline (Elavil)  Amoxicillin (Augmentin)  Aripiprazole (Abilify)  Buspirone (BuSpar)  Carisoprodol (Soma)  Clavulinate (Augmentin)  Dexlansoprazole (Dexilant)  Ibuprofen (Advil)  Ipratropium (Combivent)  Nabumetone (Relafen)  Sertraline (Zoloft)  Tiotropium (Spiriva)  Trazodone (Desyrel)  Umeclidinium (Incruse Ellipta) ==================================================================== For clinical consultation, please call [phone]. ====================================================================   Anaerobic culture     Status: None   Collection Time: 09/22/15  1:16 PM  Result Value Ref Range   Specimen Description FLUID    Special Requests NONE    Culture NO ANAEROBES ISOLATED    Report Status 09/29/2015 FINAL   Wound culture     Status: None   Collection Time: 09/22/15  1:16 PM  Result Value Ref Range   Specimen Description WOUND    Special Requests NONE    Gram Stain FEW RED BLOOD CELLS FEW WBC SEEN     Culture NO GROWTH 3 DAYS    Report Status 09/25/2015 FINAL  CBC     Status: Abnormal   Collection Time: 09/22/15  3:58 PM  Result Value Ref Range   WBC 6.1 3.6 - 11.0 K/uL   RBC 4.57 3.80 - 5.20 MIL/uL   Hemoglobin 11.0 (L) 12.0 - 16.0 g/dL   HCT 33.7 (L) 35.0 - 47.0 %   MCV 73.8 (L) 80.0 - 100.0 fL   MCH 24.0 (L) 26.0 - 34.0 pg   MCHC 32.6 32.0 - 36.0 g/dL   RDW 17.4 (H) 11.5 - 14.5 %   Platelets 201 150 -  440 K/uL  Creatinine, serum     Status: None   Collection Time: 09/22/15  3:58 PM  Result Value Ref Range   Creatinine, Ser 0.57 0.44 - 1.00 mg/dL   GFR calc non Af Amer >60 >60 mL/min   GFR calc Af Amer >60 >60 mL/min    Comment: (NOTE) The eGFR has been calculated using the CKD EPI equation. This calculation has not been validated in all clinical situations. eGFR's persistently <60 mL/min signify possible Chronic Kidney Disease.   CBC     Status: Abnormal   Collection Time: 09/23/15  4:04 AM  Result Value Ref Range   WBC 10.5 3.6 - 11.0 K/uL   RBC 4.62 3.80 - 5.20 MIL/uL   Hemoglobin 11.0 (L) 12.0 - 16.0 g/dL   HCT 34.3 (L) 35.0 - 47.0 %   MCV 74.2 (L) 80.0 - 100.0 fL   MCH 23.9 (L) 26.0 - 34.0 pg   MCHC 32.2 32.0 - 36.0 g/dL   RDW 16.9 (H) 11.5 - 14.5 %   Platelets 195 150 - 440 K/uL  Basic metabolic panel     Status: Abnormal   Collection Time: 09/23/15  4:04 AM  Result Value Ref Range   Sodium 136 135 - 145 mmol/L   Potassium 4.1 3.5 - 5.1 mmol/L   Chloride 102 101 - 111 mmol/L   CO2 27 22 - 32 mmol/L   Glucose, Bld 129 (H) 65 - 99 mg/dL   BUN 11 6 - 20 mg/dL   Creatinine, Ser 0.47 0.44 - 1.00 mg/dL   Calcium 8.6 (L) 8.9 - 10.3 mg/dL   GFR calc non Af Amer >60 >60 mL/min   GFR calc Af Amer >60 >60 mL/min    Comment: (NOTE) The eGFR has been calculated using the CKD EPI equation. This calculation has not been validated in all clinical situations. eGFR's persistently <60 mL/min signify possible Chronic Kidney Disease.    Anion gap 7 5 - 15  CBC     Status: Abnormal   Collection Time: 09/24/15  4:11 AM  Result Value Ref Range   WBC 7.4 3.6 - 11.0 K/uL   RBC 4.37 3.80 - 5.20 MIL/uL   Hemoglobin 10.6 (L) 12.0 - 16.0 g/dL   HCT 32.9 (L) 35.0 - 47.0 %   MCV 75.2 (L) 80.0 - 100.0 fL   MCH 24.3 (L) 26.0 - 34.0 pg   MCHC 32.3 32.0 - 36.0 g/dL   RDW 17.0 (H) 11.5 - 14.5 %   Platelets 179 150 - 440 K/uL  Basic metabolic panel     Status: Abnormal   Collection Time:  09/24/15  4:11 AM  Result Value Ref Range   Sodium 134 (L) 135 - 145 mmol/L   Potassium 3.4 (L) 3.5 - 5.1 mmol/L   Chloride 100 (L) 101 - 111 mmol/L   CO2 27 22 - 32 mmol/L   Glucose, Bld 114 (H) 65 - 99  mg/dL   BUN 6 6 - 20 mg/dL   Creatinine, Ser 0.50 0.44 - 1.00 mg/dL   Calcium 8.3 (L) 8.9 - 10.3 mg/dL   GFR calc non Af Amer >60 >60 mL/min   GFR calc Af Amer >60 >60 mL/min    Comment: (NOTE) The eGFR has been calculated using the CKD EPI equation. This calculation has not been validated in all clinical situations. eGFR's persistently <60 mL/min signify possible Chronic Kidney Disease.    Anion gap 7 5 - 15  POCT urinalysis dipstick     Status: Abnormal   Collection Time: 10/02/15  8:00 AM  Result Value Ref Range   Color, UA yellow    Clarity, UA clear    Glucose, UA negative    Bilirubin, UA negative    Ketones, UA negative    Spec Grav, UA 1.015    Blood, UA negative    pH, UA 6.0    Protein, UA negative    Urobilinogen, UA 0.2    Nitrite, UA negative    Leukocytes, UA moderate (2+) (A) Negative     PHQ2/9: Depression screen [facility] 2/9 10/02/2015 09/21/2015 09/16/2015 07/08/2015 05/27/2015  Decreased Interest 0 0 0 0 0  Down, Depressed, Hopeless 0 0 0 0 0  PHQ - 2 Score 0 0 0 0 0    Fall Risk: Fall Risk  10/02/2015 09/21/2015 09/16/2015 07/22/2015 07/08/2015  Falls in the past year? No No No No No  Number falls in past yr: - - - - -  Injury with Fall? - - - - -  Risk for fall due to : - - - - -  Follow up - - - - -    Functional Status Survey: Is the patient deaf or have difficulty hearing?: No Does the patient have difficulty seeing, even when wearing glasses/contacts?: No Does the patient have difficulty concentrating, remembering, or making decisions?: No Does the patient have difficulty walking or climbing stairs?: Yes (due to recent surgery) Does the patient have difficulty dressing or bathing?: No Does the patient have difficulty doing errands alone such as  visiting a doctor's office or shopping?: No    Assessment & Plan  1. Pain with urination  - POCT urinalysis dipstick - Urine culture - Chlamydia/Gonococcus/Trichomonas, NAA  She is sexually active, same sexual partner, s/p hysterectomy Since symptoms resolved we will send urine for culture and treat only if needed. She can call back if she only notices itching for a rx of Diflucan

## 2015-10-03 LAB — CHLAMYDIA/GONOCOCCUS/TRICHOMONAS, NAA
CHLAMYDIA BY NAA: NEGATIVE
GONOCOCCUS BY NAA: NEGATIVE
TRICH VAG BY NAA: NEGATIVE

## 2015-10-03 LAB — URINE CULTURE

## 2015-10-06 ENCOUNTER — Other Ambulatory Visit: Payer: Self-pay

## 2015-10-06 ENCOUNTER — Telehealth: Payer: Self-pay | Admitting: Family Medicine

## 2015-10-06 DIAGNOSIS — M62838 Other muscle spasm: Secondary | ICD-10-CM

## 2015-10-06 MED ORDER — AMITRIPTYLINE HCL 50 MG PO TABS: 50.0000 mg | ORAL_TABLET | Freq: Every day | ORAL | Status: DC

## 2015-10-06 MED ORDER — CARISOPRODOL 350 MG PO TABS: 350.0000 mg | ORAL_TABLET | Freq: Every day | ORAL | Status: DC

## 2015-10-06 NOTE — Telephone Encounter (Signed)
Patient requesting refill. Can you please send patient medications in due to pharmacy closing soon. Thanks

## 2015-10-06 NOTE — Telephone Encounter (Signed)
Requesting refill on Soma and Elavil. Please send both to [facility]. Also calling for urine test results

## 2015-10-06 NOTE — Telephone Encounter (Signed)
Patient requesting refill. 

## 2015-10-12 DIAGNOSIS — G4440 Drug-induced headache, not elsewhere classified, not intractable: Secondary | ICD-10-CM | POA: Insufficient documentation

## 2015-10-12 DIAGNOSIS — M25551 Pain in right hip: Secondary | ICD-10-CM | POA: Diagnosis not present

## 2015-10-12 DIAGNOSIS — M5481 Occipital neuralgia: Secondary | ICD-10-CM | POA: Insufficient documentation

## 2015-10-13 ENCOUNTER — Ambulatory Visit: Payer: Medicare Other | Admitting: Family Medicine

## 2015-10-19 DIAGNOSIS — M25551 Pain in right hip: Secondary | ICD-10-CM | POA: Diagnosis not present

## 2015-10-22 DIAGNOSIS — M25551 Pain in right hip: Secondary | ICD-10-CM | POA: Diagnosis not present

## 2015-10-26 DIAGNOSIS — G8929 Other chronic pain: Secondary | ICD-10-CM | POA: Diagnosis not present

## 2015-10-26 DIAGNOSIS — M25561 Pain in right knee: Secondary | ICD-10-CM | POA: Diagnosis not present

## 2015-11-04 DIAGNOSIS — M25551 Pain in right hip: Secondary | ICD-10-CM | POA: Diagnosis not present

## 2015-11-04 DIAGNOSIS — Z96651 Presence of right artificial knee joint: Secondary | ICD-10-CM | POA: Diagnosis not present

## 2015-11-16 DIAGNOSIS — Z79899 Other long term (current) drug therapy: Secondary | ICD-10-CM | POA: Diagnosis not present

## 2015-11-16 DIAGNOSIS — F901 Attention-deficit hyperactivity disorder, predominantly hyperactive type: Secondary | ICD-10-CM | POA: Diagnosis not present

## 2015-11-16 DIAGNOSIS — F410 Panic disorder [episodic paroxysmal anxiety] without agoraphobia: Secondary | ICD-10-CM | POA: Diagnosis not present

## 2015-11-16 DIAGNOSIS — F4310 Post-traumatic stress disorder, unspecified: Secondary | ICD-10-CM | POA: Diagnosis not present

## 2015-11-16 DIAGNOSIS — F3189 Other bipolar disorder: Secondary | ICD-10-CM | POA: Diagnosis not present

## 2015-11-16 DIAGNOSIS — F902 Attention-deficit hyperactivity disorder, combined type: Secondary | ICD-10-CM | POA: Diagnosis not present

## 2015-11-18 ENCOUNTER — Encounter: Payer: Self-pay | Admitting: Anesthesiology

## 2015-11-18 ENCOUNTER — Ambulatory Visit: Payer: Medicare Other | Attending: Anesthesiology | Admitting: Anesthesiology

## 2015-11-18 VITALS — BP 140/97 | HR 115 | Temp 98.4°F | Resp 18 | Ht 64.0 in | Wt 241.0 lb

## 2015-11-18 DIAGNOSIS — G43009 Migraine without aura, not intractable, without status migrainosus: Secondary | ICD-10-CM | POA: Diagnosis not present

## 2015-11-18 DIAGNOSIS — G8929 Other chronic pain: Secondary | ICD-10-CM | POA: Insufficient documentation

## 2015-11-18 DIAGNOSIS — M25552 Pain in left hip: Secondary | ICD-10-CM | POA: Insufficient documentation

## 2015-11-18 DIAGNOSIS — F17210 Nicotine dependence, cigarettes, uncomplicated: Secondary | ICD-10-CM | POA: Insufficient documentation

## 2015-11-18 DIAGNOSIS — M51369 Other intervertebral disc degeneration, lumbar region without mention of lumbar back pain or lower extremity pain: Secondary | ICD-10-CM

## 2015-11-18 DIAGNOSIS — M5136 Other intervertebral disc degeneration, lumbar region: Secondary | ICD-10-CM

## 2015-11-18 DIAGNOSIS — Z9981 Dependence on supplemental oxygen: Secondary | ICD-10-CM | POA: Diagnosis not present

## 2015-11-18 DIAGNOSIS — M150 Primary generalized (osteo)arthritis: Secondary | ICD-10-CM | POA: Diagnosis not present

## 2015-11-18 DIAGNOSIS — F5104 Psychophysiologic insomnia: Secondary | ICD-10-CM | POA: Insufficient documentation

## 2015-11-18 DIAGNOSIS — M4696 Unspecified inflammatory spondylopathy, lumbar region: Secondary | ICD-10-CM | POA: Insufficient documentation

## 2015-11-18 DIAGNOSIS — Z842 Family history of other diseases of the genitourinary system: Secondary | ICD-10-CM

## 2015-11-18 DIAGNOSIS — I10 Essential (primary) hypertension: Secondary | ICD-10-CM | POA: Diagnosis not present

## 2015-11-18 DIAGNOSIS — E785 Hyperlipidemia, unspecified: Secondary | ICD-10-CM | POA: Diagnosis not present

## 2015-11-18 DIAGNOSIS — Z79891 Long term (current) use of opiate analgesic: Secondary | ICD-10-CM | POA: Diagnosis not present

## 2015-11-18 DIAGNOSIS — Z7982 Long term (current) use of aspirin: Secondary | ICD-10-CM | POA: Diagnosis not present

## 2015-11-18 DIAGNOSIS — M545 Low back pain: Secondary | ICD-10-CM | POA: Insufficient documentation

## 2015-11-18 DIAGNOSIS — R52 Pain, unspecified: Secondary | ICD-10-CM | POA: Insufficient documentation

## 2015-11-18 DIAGNOSIS — M179 Osteoarthritis of knee, unspecified: Secondary | ICD-10-CM

## 2015-11-18 DIAGNOSIS — Z6841 Body Mass Index (BMI) 40.0 and over, adult: Secondary | ICD-10-CM | POA: Insufficient documentation

## 2015-11-18 DIAGNOSIS — K219 Gastro-esophageal reflux disease without esophagitis: Secondary | ICD-10-CM | POA: Insufficient documentation

## 2015-11-18 DIAGNOSIS — M25561 Pain in right knee: Secondary | ICD-10-CM | POA: Insufficient documentation

## 2015-11-18 DIAGNOSIS — E119 Type 2 diabetes mellitus without complications: Secondary | ICD-10-CM | POA: Insufficient documentation

## 2015-11-18 DIAGNOSIS — M4692 Unspecified inflammatory spondylopathy, cervical region: Secondary | ICD-10-CM

## 2015-11-18 DIAGNOSIS — M25551 Pain in right hip: Secondary | ICD-10-CM | POA: Diagnosis not present

## 2015-11-18 DIAGNOSIS — M170 Bilateral primary osteoarthritis of knee: Secondary | ICD-10-CM

## 2015-11-18 DIAGNOSIS — M159 Polyosteoarthritis, unspecified: Secondary | ICD-10-CM

## 2015-11-18 DIAGNOSIS — M160 Bilateral primary osteoarthritis of hip: Secondary | ICD-10-CM | POA: Insufficient documentation

## 2015-11-18 DIAGNOSIS — G8918 Other acute postprocedural pain: Secondary | ICD-10-CM

## 2015-11-18 DIAGNOSIS — F319 Bipolar disorder, unspecified: Secondary | ICD-10-CM | POA: Insufficient documentation

## 2015-11-18 DIAGNOSIS — F418 Other specified anxiety disorders: Secondary | ICD-10-CM | POA: Diagnosis not present

## 2015-11-18 DIAGNOSIS — E8881 Metabolic syndrome: Secondary | ICD-10-CM | POA: Diagnosis not present

## 2015-11-18 DIAGNOSIS — G4730 Sleep apnea, unspecified: Secondary | ICD-10-CM | POA: Insufficient documentation

## 2015-11-18 DIAGNOSIS — J449 Chronic obstructive pulmonary disease, unspecified: Secondary | ICD-10-CM | POA: Diagnosis not present

## 2015-11-18 DIAGNOSIS — M542 Cervicalgia: Secondary | ICD-10-CM

## 2015-11-18 DIAGNOSIS — M1712 Unilateral primary osteoarthritis, left knee: Secondary | ICD-10-CM

## 2015-11-18 DIAGNOSIS — M25562 Pain in left knee: Secondary | ICD-10-CM | POA: Diagnosis not present

## 2015-11-18 DIAGNOSIS — F1190 Opioid use, unspecified, uncomplicated: Secondary | ICD-10-CM | POA: Diagnosis not present

## 2015-11-18 DIAGNOSIS — M8949 Other hypertrophic osteoarthropathy, multiple sites: Secondary | ICD-10-CM

## 2015-11-18 DIAGNOSIS — K5909 Other constipation: Secondary | ICD-10-CM | POA: Insufficient documentation

## 2015-11-18 DIAGNOSIS — M1711 Unilateral primary osteoarthritis, right knee: Secondary | ICD-10-CM

## 2015-11-18 DIAGNOSIS — M47812 Spondylosis without myelopathy or radiculopathy, cervical region: Secondary | ICD-10-CM

## 2015-11-18 MED ORDER — OXYCODONE HCL 10 MG PO TABS: 10.0000 mg | ORAL_TABLET | Freq: Three times a day (TID) | ORAL | Status: DC

## 2015-11-18 MED ORDER — OXYCODONE HCL 10 MG PO TABS: 10.0000 mg | ORAL_TABLET | Freq: Four times a day (QID) | ORAL | Status: DC

## 2015-11-18 NOTE — Progress Notes (Signed)
Safety precautions to be maintained throughout the outpatient stay will include: orient to surroundings, keep bed in low position, maintain call bell within reach at all times, provide assistance with transfer out of bed and ambulation.  

## 2015-11-18 NOTE — Progress Notes (Signed)
Subjective:    Patient ID: Wendy Johnson, female    DOB: Sep 16, 1965, 50 y.o.   MRN: [id number]    Chief complaint: Chronic low back pain. Chronic bilateral lower extremity pain and diffuse body pain  HPI: Wendy Johnson presents for reevaluation last seen back in May. The quality characteristic distribution of the low back pain and lower extremity pain are otherwise unchanged. She did follow-up with Dr. Youlanda Mighty and had additional surgery requiring repair of some hardware and removal of some scar tissue in her right knee. She had been taking some oxycodone 5 mg tablets for breakthrough pain from Dr. Rudene Christians. She is no longer using these and is doing well with her current regimen. Based on her narcotic assessment sheet she's tolerating her medications well with no evidence of any diverting or illicit use and denies any problems with them during today's evaluation.   BP 140/97 mmHg  Pulse 115  Temp(Src) 98.4 F (36.9 C) (Oral)  Resp 18  Ht 5\' 4"  (1.626 m)  Wt 241 lb (109.317 kg)  BMI 41.35 kg/m2  SpO2 97%  LMP 09/26/2014    Current outpatient prescriptions:  .  amitriptyline (ELAVIL) 50 MG tablet, Take 1 tablet (50 mg total) by mouth at bedtime. Reported on 09/16/2015, Disp: 30 tablet, Rfl: 1 .  amphetamine-dextroamphetamine (ADDERALL) 30 MG tablet, Take 30 mg by mouth 2 (two) times daily., Disp: , Rfl:  .  ARIPiprazole (ABILIFY) 15 MG tablet, Take 15 mg by mouth at bedtime. , Disp: , Rfl:  .  aspirin EC 325 MG tablet, Take 1 tablet (325 mg total) by mouth daily., Disp: 30 tablet, Rfl: 0 .  busPIRone (BUSPAR) 10 MG tablet, Take 20 mg by mouth 2 (two) times daily. , Disp: , Rfl:  .  carisoprodol (SOMA) 350 MG tablet, Take 1 tablet (350 mg total) by mouth at bedtime., Disp: 30 tablet, Rfl: 2 .  clonazePAM (KLONOPIN) 1 MG tablet, Take 1-2 mg by mouth 3 (three) times daily as needed for anxiety., Disp: , Rfl:  .  dexlansoprazole (DEXILANT) 60 MG capsule, Take 1 capsule (60 mg total) by mouth daily., Disp: 30  capsule, Rfl: 5 .  Ipratropium-Albuterol (COMBIVENT) 20-100 MCG/ACT AERS respimat, Inhale 1 puff into the lungs every 6 (six) hours., Disp: 4 g, Rfl: 5 .  Oxycodone HCl 10 MG TABS, Take 1 tablet (10 mg total) by mouth QID., Disp: 105 tablet, Rfl: 0 .  sertraline (ZOLOFT) 50 MG tablet, Take 50 mg by mouth 2 (two) times daily. , Disp: , Rfl:  .  tiotropium (SPIRIVA) 18 MCG inhalation capsule, Place 1 capsule (18 mcg total) into inhaler and inhale daily., Disp: 30 capsule, Rfl: 5 .  traZODone (DESYREL) 150 MG tablet, Take 300 mg by mouth at bedtime as needed for sleep. , Disp: , Rfl:  .  umeclidinium bromide (INCRUSE ELLIPTA) 62.5 MCG/INH AEPB, Inhale into the lungs. Reported on 09/22/2015, Disp: , Rfl:  .  lidocaine (XYLOCAINE) 2 % solution, Reported on 11/18/2015, Disp: , Rfl:  .  magic mouthwash w/lidocaine SOLN, Take 15 mLs by mouth 4 (four) times daily. (Patient not taking: Reported on 11/18/2015), Disp: 300 mL, Rfl: 1 .  nabumetone (RELAFEN) 750 MG tablet, Take 750 mg by mouth 2 (two) times daily as needed. Reported on 11/18/2015, Disp: , Rfl:  .  sertraline (ZOLOFT) 100 MG tablet, Reported on 11/18/2015, Disp: , Rfl:    Patient Active Problem List   Diagnosis Date Noted  . Instability of prosthetic knee (HCC)  09/22/2015  . Primary osteoarthritis of right hip 05/12/2015  . Sleep apnea 04/07/2015  . Primary osteoarthritis of left hip 12/16/2014  . Metabolic syndrome 123XX123  . Migraine without aura and without status migrainosus, not intractable 11/26/2014  . GERD without esophagitis 11/26/2014  . COPD, moderate ([place]) 11/26/2014  . Nocturnal oxygen desaturation 11/26/2014  . Supplemental oxygen dependent 11/26/2014  . Hearing loss 11/26/2014  . Chronic insomnia 11/26/2014  . History of hypertension 11/26/2014  . Dyslipidemia 11/26/2014  . History of morbid obesity 11/26/2014  . History of diabetes mellitus, type II 11/26/2014  . Chronic constipation 11/26/2014  . Generalized anxiety  disorder 11/11/2014  . DDD (degenerative disc disease), lumbar 11/11/2014  . Facet arthritis of lumbar region 11/11/2014  . Primary osteoarthritis involving multiple joints 11/11/2014  . H/O hysterectomy for benign disease 10/20/2014  . History of endometriosis 10/16/2014  . Low back derangement syndrome 09/30/2014  . Degenerative joint disease of both lower legs 09/30/2014  . Bipolar disorder ([place]) 09/30/2014     Review of Systems  Review of systems negative for cardiovascular negative for pulmonary she snores if her neurologic positive for anxiety depression panic attacks and insomnia she also has bipolar disease as CD with a history of reflux negative for GU negative for hematologic negative for endocrine negative for rheumatologic  Social history is positive for being divorced with one child she smokes pack cigarettes per day for doing this for 12 years  Currently disabled since 2006.  Family history is positive for alcoholism diabetes and high blood pressure     Objective:   Physical Exam  Patient is 50 year old white female in no acute distress is alert and oriented 3 and compliant with examination. Her heart is regular rate and rhythm.     Assessment & Plan:  #1. Chronic low back pain with low back syndrome and degenerative disc disease  #2. Diffuse body pain with osteoarthritis  #3. Degenerative joint disease with severe left hip pain and right hip pain And bilateral knee pain  #4. History of bipolar disorder  #5. History of anxiety depression on chronic opioid management  Plan:We will refill today . We have had another discussion regarding chronic opioid use and I have counseled her in regards to the combined effect of her other medications. At present she takes 1 Soma a day on a when necessary basis. Her other medications are not causing her any untoward effect in combination with the oxycodone.  Dr. Vashti Hey

## 2015-12-09 ENCOUNTER — Other Ambulatory Visit: Payer: Self-pay

## 2015-12-09 MED ORDER — AMITRIPTYLINE HCL 50 MG PO TABS: 50.0000 mg | ORAL_TABLET | Freq: Every day | ORAL | 1 refills | Status: DC

## 2015-12-09 NOTE — Telephone Encounter (Signed)
Patient requesting refill. Amitriptyline

## 2015-12-14 DIAGNOSIS — G4440 Drug-induced headache, not elsewhere classified, not intractable: Secondary | ICD-10-CM | POA: Diagnosis not present

## 2015-12-14 DIAGNOSIS — M5481 Occipital neuralgia: Secondary | ICD-10-CM | POA: Diagnosis not present

## 2015-12-18 DIAGNOSIS — Z96641 Presence of right artificial hip joint: Secondary | ICD-10-CM | POA: Diagnosis not present

## 2015-12-18 DIAGNOSIS — M1712 Unilateral primary osteoarthritis, left knee: Secondary | ICD-10-CM | POA: Diagnosis not present

## 2015-12-18 DIAGNOSIS — M25562 Pain in left knee: Secondary | ICD-10-CM | POA: Diagnosis not present

## 2015-12-24 ENCOUNTER — Telehealth: Payer: Self-pay | Admitting: Anesthesiology

## 2015-12-24 NOTE — Telephone Encounter (Signed)
Patient called requesting appt for med refill before Sept 12, no appt available

## 2016-01-05 ENCOUNTER — Ambulatory Visit: Payer: Medicare Other | Attending: Anesthesiology | Admitting: Anesthesiology

## 2016-01-05 ENCOUNTER — Encounter: Payer: Self-pay | Admitting: Anesthesiology

## 2016-01-05 VITALS — BP 135/81 | HR 88 | Temp 98.0°F | Resp 16 | Ht 63.0 in | Wt 230.0 lb

## 2016-01-05 DIAGNOSIS — M25562 Pain in left knee: Secondary | ICD-10-CM | POA: Diagnosis not present

## 2016-01-05 DIAGNOSIS — M542 Cervicalgia: Secondary | ICD-10-CM | POA: Diagnosis not present

## 2016-01-05 DIAGNOSIS — F319 Bipolar disorder, unspecified: Secondary | ICD-10-CM | POA: Insufficient documentation

## 2016-01-05 DIAGNOSIS — M4692 Unspecified inflammatory spondylopathy, cervical region: Secondary | ICD-10-CM

## 2016-01-05 DIAGNOSIS — J449 Chronic obstructive pulmonary disease, unspecified: Secondary | ICD-10-CM | POA: Insufficient documentation

## 2016-01-05 DIAGNOSIS — M545 Low back pain, unspecified: Secondary | ICD-10-CM

## 2016-01-05 DIAGNOSIS — M179 Osteoarthritis of knee, unspecified: Secondary | ICD-10-CM

## 2016-01-05 DIAGNOSIS — M5386 Other specified dorsopathies, lumbar region: Secondary | ICD-10-CM

## 2016-01-05 DIAGNOSIS — K219 Gastro-esophageal reflux disease without esophagitis: Secondary | ICD-10-CM | POA: Insufficient documentation

## 2016-01-05 DIAGNOSIS — M5136 Other intervertebral disc degeneration, lumbar region: Secondary | ICD-10-CM

## 2016-01-05 DIAGNOSIS — Z7982 Long term (current) use of aspirin: Secondary | ICD-10-CM | POA: Insufficient documentation

## 2016-01-05 DIAGNOSIS — E119 Type 2 diabetes mellitus without complications: Secondary | ICD-10-CM | POA: Insufficient documentation

## 2016-01-05 DIAGNOSIS — G43009 Migraine without aura, not intractable, without status migrainosus: Secondary | ICD-10-CM | POA: Diagnosis not present

## 2016-01-05 DIAGNOSIS — M160 Bilateral primary osteoarthritis of hip: Secondary | ICD-10-CM | POA: Diagnosis not present

## 2016-01-05 DIAGNOSIS — E8881 Metabolic syndrome: Secondary | ICD-10-CM | POA: Insufficient documentation

## 2016-01-05 DIAGNOSIS — R52 Pain, unspecified: Secondary | ICD-10-CM | POA: Diagnosis not present

## 2016-01-05 DIAGNOSIS — Z842 Family history of other diseases of the genitourinary system: Secondary | ICD-10-CM

## 2016-01-05 DIAGNOSIS — M47812 Spondylosis without myelopathy or radiculopathy, cervical region: Secondary | ICD-10-CM

## 2016-01-05 DIAGNOSIS — M25561 Pain in right knee: Secondary | ICD-10-CM | POA: Insufficient documentation

## 2016-01-05 DIAGNOSIS — Z96659 Presence of unspecified artificial knee joint: Secondary | ICD-10-CM | POA: Insufficient documentation

## 2016-01-05 DIAGNOSIS — M170 Bilateral primary osteoarthritis of knee: Secondary | ICD-10-CM

## 2016-01-05 DIAGNOSIS — Z9981 Dependence on supplemental oxygen: Secondary | ICD-10-CM | POA: Diagnosis not present

## 2016-01-05 DIAGNOSIS — G8929 Other chronic pain: Secondary | ICD-10-CM | POA: Diagnosis not present

## 2016-01-05 DIAGNOSIS — K5909 Other constipation: Secondary | ICD-10-CM | POA: Insufficient documentation

## 2016-01-05 DIAGNOSIS — F5104 Psychophysiologic insomnia: Secondary | ICD-10-CM | POA: Insufficient documentation

## 2016-01-05 DIAGNOSIS — G4730 Sleep apnea, unspecified: Secondary | ICD-10-CM | POA: Insufficient documentation

## 2016-01-05 DIAGNOSIS — F411 Generalized anxiety disorder: Secondary | ICD-10-CM | POA: Insufficient documentation

## 2016-01-05 DIAGNOSIS — M1990 Unspecified osteoarthritis, unspecified site: Secondary | ICD-10-CM | POA: Diagnosis not present

## 2016-01-05 DIAGNOSIS — E785 Hyperlipidemia, unspecified: Secondary | ICD-10-CM | POA: Insufficient documentation

## 2016-01-05 DIAGNOSIS — I10 Essential (primary) hypertension: Secondary | ICD-10-CM | POA: Diagnosis not present

## 2016-01-05 DIAGNOSIS — M1711 Unilateral primary osteoarthritis, right knee: Secondary | ICD-10-CM

## 2016-01-05 DIAGNOSIS — Z6841 Body Mass Index (BMI) 40.0 and over, adult: Secondary | ICD-10-CM | POA: Insufficient documentation

## 2016-01-05 DIAGNOSIS — Z79891 Long term (current) use of opiate analgesic: Secondary | ICD-10-CM | POA: Insufficient documentation

## 2016-01-05 DIAGNOSIS — M1712 Unilateral primary osteoarthritis, left knee: Secondary | ICD-10-CM

## 2016-01-05 DIAGNOSIS — M47816 Spondylosis without myelopathy or radiculopathy, lumbar region: Secondary | ICD-10-CM

## 2016-01-05 DIAGNOSIS — M79605 Pain in left leg: Secondary | ICD-10-CM | POA: Diagnosis present

## 2016-01-05 DIAGNOSIS — F17210 Nicotine dependence, cigarettes, uncomplicated: Secondary | ICD-10-CM | POA: Insufficient documentation

## 2016-01-05 DIAGNOSIS — G8918 Other acute postprocedural pain: Secondary | ICD-10-CM

## 2016-01-05 MED ORDER — OXYCODONE HCL 10 MG PO TABS: 10.0000 mg | ORAL_TABLET | Freq: Four times a day (QID) | ORAL | 0 refills | Status: DC

## 2016-01-05 NOTE — Patient Instructions (Signed)

## 2016-01-06 NOTE — Progress Notes (Signed)
Subjective:    Patient ID: Wendy Johnson, female    DOB: [birth year], 50 y.o.   MRN: [id number]    Chief complaint: Chronic low back pain. Chronic bilateral lower extremity pain and diffuse body pain  HPI: Wendy Johnson presents for reevaluation last seen back in July. The quality characteristic distribution of the low back pain and lower extremity pain are otherwise unchanged. She continues to have knee and hip pain as before and has been seen by Dr. Rudene Christians with no additional surgery planned at this time. She is attempting to strengthen her lower extremities with exercise management and core strengthening exercises to assist with her low back pain and lower leg pain as well. Her medications appear to be working well based on her narcotic assessment sheet and she otherwise claims to be doing well with this regimen with no other problems mentioned today.   BP 135/81 (Patient Position: Sitting)   Pulse 88   Temp 98 F (36.7 C) (Oral)   Resp 16   Ht 5\' 3"  (1.6 m)   Wt 230 lb (104.3 kg)   LMP 09/26/2014   SpO2 100%   BMI 40.74 kg/m     Current Outpatient Prescriptions:  .  amphetamine-dextroamphetamine (ADDERALL) 30 MG tablet, Take 30 mg by mouth 2 (two) times daily., Disp: , Rfl:  .  ARIPiprazole (ABILIFY) 15 MG tablet, Take 15 mg by mouth at bedtime. , Disp: , Rfl:  .  aspirin EC 325 MG tablet, Take 1 tablet (325 mg total) by mouth daily., Disp: 30 tablet, Rfl: 0 .  carisoprodol (SOMA) 350 MG tablet, Take 1 tablet (350 mg total) by mouth at bedtime., Disp: 30 tablet, Rfl: 2 .  clonazePAM (KLONOPIN) 1 MG tablet, Take 1-2 mg by mouth 3 (three) times daily as needed for anxiety., Disp: , Rfl:  .  dexlansoprazole (DEXILANT) 60 MG capsule, Take 1 capsule (60 mg total) by mouth daily., Disp: 30 capsule, Rfl: 5 .  lidocaine (XYLOCAINE) 2 % solution, Reported on 11/18/2015, Disp: , Rfl:  .  magic mouthwash w/lidocaine SOLN, Take 15 mLs by mouth 4 (four) times daily., Disp: 300 mL, Rfl: 1 .  Oxycodone HCl  10 MG TABS, Take 1 tablet (10 mg total) by mouth QID., Disp: 105 tablet, Rfl: 0 .  sertraline (ZOLOFT) 50 MG tablet, Take 25 mg by mouth 2 (two) times daily. , Disp: , Rfl:  .  tiotropium (SPIRIVA) 18 MCG inhalation capsule, Place 1 capsule (18 mcg total) into inhaler and inhale daily., Disp: 30 capsule, Rfl: 5 .  traZODone (DESYREL) 150 MG tablet, Take 300 mg by mouth at bedtime as needed for sleep. , Disp: , Rfl:  .  umeclidinium bromide (INCRUSE ELLIPTA) 62.5 MCG/INH AEPB, Inhale into the lungs. Reported on 09/22/2015, Disp: , Rfl:  .  amitriptyline (ELAVIL) 50 MG tablet, Take 1 tablet (50 mg total) by mouth at bedtime. Reported on 09/16/2015 (Patient not taking: Reported on 01/05/2016), Disp: 30 tablet, Rfl: 1 .  busPIRone (BUSPAR) 10 MG tablet, Take 20 mg by mouth 2 (two) times daily. , Disp: , Rfl:  .  Ipratropium-Albuterol (COMBIVENT) 20-100 MCG/ACT AERS respimat, Inhale 1 puff into the lungs every 6 (six) hours. (Patient not taking: Reported on 01/05/2016), Disp: 4 g, Rfl: 5 .  nabumetone (RELAFEN) 750 MG tablet, Take 750 mg by mouth 2 (two) times daily as needed. Reported on 11/18/2015, Disp: , Rfl:  .  sertraline (ZOLOFT) 100 MG tablet, Reported on 11/18/2015, Disp: , Rfl:  Patient Active Problem List   Diagnosis Date Noted  . Instability of prosthetic knee ([place]) 09/22/2015  . Primary osteoarthritis of right hip 05/12/2015  . Sleep apnea 04/07/2015  . Primary osteoarthritis of left hip 12/16/2014  . Metabolic syndrome 123XX123  . Migraine without aura and without status migrainosus, not intractable 11/26/2014  . GERD without esophagitis 11/26/2014  . COPD, moderate ([place]) 11/26/2014  . Nocturnal oxygen desaturation 11/26/2014  . Supplemental oxygen dependent 11/26/2014  . Hearing loss 11/26/2014  . Chronic insomnia 11/26/2014  . History of hypertension 11/26/2014  . Dyslipidemia 11/26/2014  . History of morbid obesity 11/26/2014  . History of diabetes mellitus, type II 11/26/2014   . Chronic constipation 11/26/2014  . Generalized anxiety disorder 11/11/2014  . DDD (degenerative disc disease), lumbar 11/11/2014  . Facet arthritis of lumbar region 11/11/2014  . Primary osteoarthritis involving multiple joints 11/11/2014  . H/O hysterectomy for benign disease 10/20/2014  . History of endometriosis 10/16/2014  . Low back derangement syndrome 09/30/2014  . Degenerative joint disease of both lower legs 09/30/2014  . Bipolar disorder ([place]) 09/30/2014     Review of Systems  Review of systems negative for cardiovascular negative for pulmonary she snores if her neurologic positive for anxiety depression panic attacks and insomnia she also has bipolar disease as CD with a history of reflux negative for GU negative for hematologic negative for endocrine negative for rheumatologic  Social history is positive for being divorced with one child she smokes pack cigarettes per day for doing this for 12 years  Currently disabled since 2006.  Family history is positive for alcoholism diabetes and high blood pressure     Objective:   Physical Exam  Patient is 50 year old white female in no acute distress is alert and oriented 3 and compliant with examination. Her heart is regular rate and rhythm.     Assessment & Plan:  #1. Chronic low back pain with low back syndrome and degenerative disc disease  #2. Diffuse body pain with osteoarthritis  #3. Degenerative joint disease with severe left hip pain and right hip pain And bilateral knee pain  #4. History of bipolar disorder  #5. History of anxiety depression on chronic opioid management  Plan:We will refill Her medications today . We once again had another discussion regarding chronic opioid use and I have counseled her in regards to the combined effect of her other medications. At present she takes 1 Soma a day on a when necessary basis. Her other medications are not causing her any untoward effect in combination with the  oxycodone.  Dr. Vashti Hey

## 2016-01-08 ENCOUNTER — Other Ambulatory Visit: Payer: Self-pay

## 2016-01-08 DIAGNOSIS — M62838 Other muscle spasm: Secondary | ICD-10-CM

## 2016-01-08 NOTE — Telephone Encounter (Signed)
Last filled and seen 10/06/15

## 2016-01-18 ENCOUNTER — Other Ambulatory Visit: Payer: Self-pay | Admitting: Family Medicine

## 2016-01-18 ENCOUNTER — Telehealth: Payer: Self-pay | Admitting: Family Medicine

## 2016-01-18 DIAGNOSIS — M62838 Other muscle spasm: Secondary | ICD-10-CM

## 2016-01-18 MED ORDER — CARISOPRODOL 350 MG PO TABS: 350.0000 mg | ORAL_TABLET | Freq: Every day | ORAL | 0 refills | Status: DC

## 2016-01-18 MED ORDER — CARISOPRODOL 350 MG PO TABS: 350.0000 mg | ORAL_TABLET | Freq: Every day | ORAL | 2 refills | Status: DC

## 2016-01-18 NOTE — Telephone Encounter (Signed)
Sending one month supply, but she will need follow up

## 2016-01-18 NOTE — Telephone Encounter (Signed)
Patient is requesting a refill on Soma to be sent to the [facility].  Please call patient once complete.

## 2016-01-21 ENCOUNTER — Telehealth: Payer: Self-pay | Admitting: Family Medicine

## 2016-01-21 NOTE — Telephone Encounter (Signed)
Left voicemail notify patient medication was called into pharmacy.

## 2016-01-21 NOTE — Telephone Encounter (Signed)
Looks like it was sent

## 2016-02-03 ENCOUNTER — Encounter: Payer: Self-pay | Admitting: Family Medicine

## 2016-02-03 ENCOUNTER — Other Ambulatory Visit: Payer: Self-pay | Admitting: Family Medicine

## 2016-02-03 ENCOUNTER — Ambulatory Visit (INDEPENDENT_AMBULATORY_CARE_PROVIDER_SITE_OTHER): Payer: Medicare Other | Admitting: Family Medicine

## 2016-02-03 VITALS — BP 134/84 | HR 120 | Temp 98.0°F | Resp 18 | Ht 63.0 in | Wt 256.6 lb

## 2016-02-03 DIAGNOSIS — E785 Hyperlipidemia, unspecified: Secondary | ICD-10-CM | POA: Diagnosis not present

## 2016-02-03 DIAGNOSIS — E119 Type 2 diabetes mellitus without complications: Secondary | ICD-10-CM | POA: Diagnosis not present

## 2016-02-03 DIAGNOSIS — G4440 Drug-induced headache, not elsewhere classified, not intractable: Secondary | ICD-10-CM

## 2016-02-03 DIAGNOSIS — M549 Dorsalgia, unspecified: Secondary | ICD-10-CM

## 2016-02-03 DIAGNOSIS — Z79899 Other long term (current) drug therapy: Secondary | ICD-10-CM

## 2016-02-03 DIAGNOSIS — J449 Chronic obstructive pulmonary disease, unspecified: Secondary | ICD-10-CM

## 2016-02-03 DIAGNOSIS — E8881 Metabolic syndrome: Secondary | ICD-10-CM

## 2016-02-03 DIAGNOSIS — G4734 Idiopathic sleep related nonobstructive alveolar hypoventilation: Secondary | ICD-10-CM | POA: Diagnosis not present

## 2016-02-03 DIAGNOSIS — D649 Anemia, unspecified: Secondary | ICD-10-CM

## 2016-02-03 DIAGNOSIS — Z23 Encounter for immunization: Secondary | ICD-10-CM | POA: Diagnosis not present

## 2016-02-03 DIAGNOSIS — G4730 Sleep apnea, unspecified: Secondary | ICD-10-CM | POA: Diagnosis not present

## 2016-02-03 DIAGNOSIS — M62838 Other muscle spasm: Secondary | ICD-10-CM

## 2016-02-03 DIAGNOSIS — K219 Gastro-esophageal reflux disease without esophagitis: Secondary | ICD-10-CM | POA: Diagnosis not present

## 2016-02-03 DIAGNOSIS — R635 Abnormal weight gain: Secondary | ICD-10-CM

## 2016-02-03 DIAGNOSIS — G4441 Drug-induced headache, not elsewhere classified, intractable: Secondary | ICD-10-CM

## 2016-02-03 DIAGNOSIS — G8929 Other chronic pain: Secondary | ICD-10-CM

## 2016-02-03 DIAGNOSIS — F3131 Bipolar disorder, current episode depressed, mild: Secondary | ICD-10-CM

## 2016-02-03 MED ORDER — DULAGLUTIDE 1.5 MG/0.5ML ~~LOC~~ SOAJ: 1.5000 mg | Freq: Every day | SUBCUTANEOUS | 0 refills | Status: DC

## 2016-02-03 MED ORDER — CARISOPRODOL 350 MG PO TABS: 350.0000 mg | ORAL_TABLET | Freq: Every day | ORAL | 2 refills | Status: DC

## 2016-02-03 MED ORDER — DEXLANSOPRAZOLE 60 MG PO CPDR: 60.0000 mg | DELAYED_RELEASE_CAPSULE | Freq: Every day | ORAL | 5 refills | Status: DC

## 2016-02-03 MED ORDER — NORTRIPTYLINE HCL 50 MG PO CAPS: 100.0000 mg | ORAL_CAPSULE | Freq: Every day | ORAL | 0 refills | Status: DC

## 2016-02-03 MED ORDER — TIOTROPIUM BROMIDE MONOHYDRATE 18 MCG IN CAPS: 18.0000 ug | ORAL_CAPSULE | Freq: Every day | RESPIRATORY_TRACT | 5 refills | Status: DC

## 2016-02-03 NOTE — Progress Notes (Signed)
Name: Wendy Johnson   MRN: [id number]    DOB: Jan 15, 1966   Date:02/03/2016       Progress Note  Subjective  Chief Complaint  Chief Complaint  Patient presents with  . Medication Refill  . Weight Gain    pt continues to gain weight dispite watching her diet and worrking out    HPI  OSA: using CPAP every night, with oxygen, and  humidifier.  She has been compliant with machine, wears it all night. She wakes up feeling rested, seldom has a morning headache.  Headache: seen by Dr Melrose Nakayama in August, and was advised to stop Relafen and Elavil and switch to Nortriptyline. She states she stopped taking because it was not working, she still has headaches all the time and can last a few days. Pain is described as sharp and radiates from frontal to nuchal area.   GERD: taking Dexilant, and tries to follow a GERD appropriate diet. She denies current heartburn or regurgitation.   Chronic pain/muscles spasms: she goes to the pain clinic, sees Dr. Andree Elk, she is on Soma and also takes Oxycodone, and is off Relafen.   COPD: taking Spiriva, still smoking, uses oxygen at night. She denies cough, no SOB with activity. No recent flares, only had a couple flares last year.   Bipolar: sees psychiatrist, compliant with medication , on lower dose of Zoloft, sleeping well, and denies recent mania, upset about her weight gain  Obesity: she was doing well, and had lost a lot of weight, she was down to 185 lbs, but has gradually gained weight back and is very frustrated, still going to the gym, trying to eat healthy but is not working now  DMII: she was diagnosed years ago with DM and was doing well after weight loss, she denies polyphagia, polydipsia or polyuria, no blurred vision. She has metabolic syndrome. Needs eye exam  COPD: she is doing well, denies cough or wheezing, but is on nocturnal oxygen  Patient Active Problem List   Diagnosis Date Noted  . Occipital neuralgia 10/12/2015  . Medication  overuse headache 10/12/2015  . Instability of prosthetic knee ([place]) 09/22/2015  . Primary osteoarthritis of right hip 05/12/2015  . Sleep apnea 04/07/2015  . Primary osteoarthritis of left hip 12/16/2014  . Metabolic syndrome 123XX123  . Migraine without aura and without status migrainosus, not intractable 11/26/2014  . GERD without esophagitis 11/26/2014  . COPD, moderate ([place]) 11/26/2014  . Nocturnal oxygen desaturation 11/26/2014  . Supplemental oxygen dependent 11/26/2014  . Hearing loss 11/26/2014  . Chronic insomnia 11/26/2014  . History of hypertension 11/26/2014  . Dyslipidemia 11/26/2014  . Morbid obesity ([place]) 11/26/2014  . Diabetes mellitus type 2, diet-controlled ([place]) 11/26/2014  . Chronic constipation 11/26/2014  . Generalized anxiety disorder 11/11/2014  . DDD (degenerative disc disease), lumbar 11/11/2014  . Facet arthritis of lumbar region 11/11/2014  . Primary osteoarthritis involving multiple joints 11/11/2014  . H/O hysterectomy for benign disease 10/20/2014  . History of endometriosis 10/16/2014  . Low back derangement syndrome 09/30/2014  . Degenerative joint disease of both lower legs 09/30/2014  . Bipolar disorder ([place]) 09/30/2014    Past Surgical History:  Procedure Laterality Date  . ABDOMINAL HYSTERECTOMY N/A 10/20/2014   Procedure: Total abdominial hysterectomy, bilateral salpingo-oophorectomy;  Surgeon: Brayton Mars, MD;  Location: [facility];  Service: Gynecology;  Laterality: N/A;  . BILATERAL SALPINGOOPHORECTOMY    . bone spurs removed Bilateral   . FOOT SURGERY Bilateral   . JOINT REPLACEMENT  Right    Total Knee replacement X 2  . JOINT REPLACEMENT Bilateral    Total Hip Replacement  . knee arthroscopo Right   . LAPAROSCOPY  09/22/2014   Procedure: LAPAROSCOPY OPERATIVE;  Surgeon: Brayton Mars, MD;  Location: [facility];  Service: Gynecology;;  excision and fulgeration of endomertriosis  . LIPOMA EXCISION    . TONSILLECTOMY     . TOTAL HIP ARTHROPLASTY Left 12/16/2014   Procedure: TOTAL HIP ARTHROPLASTY ANTERIOR APPROACH;  Surgeon: Hessie Knows, MD;  Location: [facility];  Service: Orthopedics;  Laterality: Left;  . TOTAL HIP ARTHROPLASTY Right 05/12/2015   Procedure: TOTAL HIP ARTHROPLASTY ANTERIOR APPROACH;  Surgeon: Hessie Knows, MD;  Location: [facility];  Service: Orthopedics;  Laterality: Right;  . TOTAL KNEE REVISION Right 09/22/2015   Procedure: TOTAL KNEE REVISION/ REVISE POLYIETHYLENE;  Surgeon: Hessie Knows, MD;  Location: [facility];  Service: Orthopedics;  Laterality: Right;  . TUBAL LIGATION      Family History  Problem Relation Age of Onset  . Diabetes Mother   . Heart disease Father   . Heart attack Father   . Diabetes Sister     Social History   Social History  . Marital status: Divorced    Spouse name: N/A  . Number of children: N/A  . Years of education: N/A   Occupational History  . Not on file.   Social History Main Topics  . Smoking status: Current Every Day Smoker    Packs/day: 1.00    Years: 35.00    Types: Cigarettes    Start date: 11/26/1979  . Smokeless tobacco: Never Used  . Alcohol use No  . Drug use: No     Comment: quit crack cocaine 2004  . Sexual activity: Yes    Partners: Male    Birth control/ protection: None   Other Topics Concern  . Not on file   Social History Narrative  . No narrative on file     Current Outpatient Prescriptions:  .  amphetamine-dextroamphetamine (ADDERALL) 30 MG tablet, Take 30 mg by mouth 2 (two) times daily., Disp: , Rfl:  .  ARIPiprazole (ABILIFY) 15 MG tablet, Take 15 mg by mouth at bedtime., Disp: , Rfl:  .  aspirin EC 325 MG tablet, Take 1 tablet (325 mg total) by mouth daily., Disp: 30 tablet, Rfl: 0 .  carisoprodol (SOMA) 350 MG tablet, Take 1 tablet (350 mg total) by mouth at bedtime., Disp: 30 tablet, Rfl: 2 .  clonazePAM (KLONOPIN) 1 MG tablet, Take 1-2 mg by mouth 3 (three) times daily as needed for anxiety., Disp: , Rfl:  .   dexlansoprazole (DEXILANT) 60 MG capsule, Take 1 capsule (60 mg total) by mouth daily., Disp: 30 capsule, Rfl: 5 .  Dulaglutide (TRULICITY) 1.5 0000000 SOPN, Inject 1.5 mg into the skin daily., Disp: 4 pen, Rfl: 0 .  magic mouthwash w/lidocaine SOLN, Take 15 mLs by mouth 4 (four) times daily. (Patient not taking: Reported on 02/03/2016), Disp: 300 mL, Rfl: 1 .  nortriptyline (PAMELOR) 50 MG capsule, Take 2 capsules (100 mg total) by mouth at bedtime. Dr. Melrose Nakayama, Disp: 60 capsule, Rfl: 0 .  Oxycodone HCl 10 MG TABS, Take 1 tablet (10 mg total) by mouth QID., Disp: 105 tablet, Rfl: 0 .  sertraline (ZOLOFT) 50 MG tablet, Take 25 mg by mouth 2 (two) times daily., Disp: , Rfl:  .  tiotropium (SPIRIVA) 18 MCG inhalation capsule, Place 1 capsule (18 mcg total) into inhaler and inhale daily., Disp:  30 capsule, Rfl: 5 .  traZODone (DESYREL) 150 MG tablet, Take 300 mg by mouth at bedtime as needed for sleep., Disp: , Rfl:   Allergies  Allergen Reactions  . Codeine Nausea Only  . Imitrex [Sumatriptan] Other (See Comments)    Chest pain     ROS  Constitutional: Negative for fever, positive for  weight change.  Respiratory: Negative for cough and shortness of breath.   Cardiovascular: Negative for chest pain or palpitations.  Gastrointestinal: Negative for abdominal pain, no bowel changes.  Musculoskeletal: Positive  for gait problem or joint swelling.  Skin: Negative for rash.  Neurological: Negative for dizziness or headache.  No other specific complaints in a complete review of systems (except as listed in HPI above).  Objective  Vitals:   02/03/16 0948  BP: 134/84  Pulse: (!) 120  Resp: 18  Temp: 98 F (36.7 C)  SpO2: 97%  Weight: 256 lb 9 oz (116.4 kg)  Height: 5\' 3"  (1.6 m)    Body mass index is 45.45 kg/m.  Physical Exam  Constitutional: Patient appears well-developed and well-nourished. Obese No distress.  HEENT: head atraumatic, normocephalic, pupils equal and reactive to  light,  neck supple, throat within normal limits Cardiovascular: Normal rate, regular rhythm and normal heart sounds.  No murmur heard. No BLE edema. Pulmonary/Chest: Effort normal and breath sounds normal. No respiratory distress. Abdominal: Soft.  There is no tenderness. Psychiatric: Patient has a normal mood and affect. behavior is normal. Judgment and thought content normal.   Diabetic Foot Exam: Diabetic Foot Exam - Simple   Simple Foot Form Diabetic Foot exam was performed with the following findings:  Yes 02/03/2016 10:46 AM  Visual Inspection No deformities, no ulcerations, no other skin breakdown bilaterally:  Yes Sensation Testing Intact to touch and monofilament testing bilaterally:  Yes Pulse Check Posterior Tibialis and Dorsalis pulse intact bilaterally:  Yes Comments     PHQ2/9: Depression screen [facility] 2/9 02/03/2016 01/05/2016 11/18/2015 10/02/2015 09/21/2015  Decreased Interest 0 0 0 0 0  Down, Depressed, Hopeless 0 0 0 0 0  PHQ - 2 Score 0 0 0 0 0     Fall Risk: Fall Risk  02/03/2016 11/18/2015 10/02/2015 09/21/2015 09/16/2015  Falls in the past year? No No No No No  Number falls in past yr: - - - - -  Injury with Fall? - - - - -  Risk for fall due to : - - - - -  Follow up - - - - -     Functional Status Survey: Is the patient deaf or have difficulty hearing?: No Does the patient have difficulty seeing, even when wearing glasses/contacts?: No Does the patient have difficulty concentrating, remembering, or making decisions?: No Does the patient have difficulty walking or climbing stairs?: No Does the patient have difficulty dressing or bathing?: No Does the patient have difficulty doing errands alone such as visiting a doctor's office or shopping?: No    Assessment & Plan   1. COPD, moderate ([place])  - tiotropium (SPIRIVA) 18 MCG inhalation capsule; Place 1 capsule (18 mcg total) into inhaler and inhale daily.  Dispense: 30 capsule; Refill: 5  2. Diabetes  mellitus type 2, diet-controlled (HCC)  - Hemoglobin A1c - Dulaglutide (TRULICITY) 1.5 0000000 SOPN; Inject 1.5 mg into the skin daily.  Dispense: 4 pen; Refill: 0  3. Dyslipidemia  Recheck next visit, not fasting   4. GERD without esophagitis  - dexlansoprazole (DEXILANT) 60 MG capsule; Take 1 capsule (  60 mg total) by mouth daily.  Dispense: 30 capsule; Refill: 5  5. Nocturnal oxygen desaturation   6. Chronic back pain  Continue follow up with Dr. Quita Skye  7. Sleep apnea  Continue CPAP every night  8. Metabolic syndrome  - Hemoglobin A1c  9. Morbid obesity, unspecified obesity type ([place])  - TSH  10. Needs flu shot  - Flu Vaccine QUAD 36+ mos IM  11. Weight gain  - TSH  12. Anemia, unspecified  - CBC with Differential/Platelet  13. Encounter for long-term (current) use of high-risk medication  - COMPLETE METABOLIC PANEL WITH GFR  14. Medication overuse headache  - nortriptyline (PAMELOR) 50 MG capsule; Take 2 capsules (100 mg total) by mouth at bedtime. Dr. Melrose Nakayama  Dispense: 60 capsule; Refill: 0  15. Bipolar affective disorder, currently depressed, mild ([place])  Continue follow up with Dr. Su  16. Muscle spasm  - carisoprodol (SOMA) 350 MG tablet; Take 1 tablet (350 mg total) by mouth at bedtime.  Dispense: 30 tablet; Refill: 2

## 2016-02-04 LAB — COMPREHENSIVE METABOLIC PANEL
ALBUMIN: 4.3 g/dL (ref 3.5–5.5)
ALT: 14 IU/L (ref 0–32)
AST: 17 IU/L (ref 0–40)
Albumin/Globulin Ratio: 1.5 (ref 1.2–2.2)
Alkaline Phosphatase: 113 IU/L (ref 39–117)
BUN/Creatinine Ratio: 22 (ref 9–23)
BUN: 12 mg/dL (ref 6–24)
CALCIUM: 9.7 mg/dL (ref 8.7–10.2)
CHLORIDE: 98 mmol/L (ref 96–106)
CO2: 27 mmol/L (ref 18–29)
CREATININE: 0.55 mg/dL — AB (ref 0.57–1.00)
GFR, EST AFRICAN AMERICAN: 128 mL/min/{1.73_m2} (ref >59)
GFR, EST NON AFRICAN AMERICAN: 111 mL/min/{1.73_m2} (ref >59)
GLUCOSE: 95 mg/dL (ref 65–99)
Globulin, Total: 2.8 g/dL (ref 1.5–4.5)
Potassium: 4.9 mmol/L (ref 3.5–5.2)
Sodium: 140 mmol/L (ref 134–144)
TOTAL PROTEIN: 7.1 g/dL (ref 6.0–8.5)

## 2016-02-04 LAB — TSH: TSH: 0.769 u[IU]/mL (ref 0.450–4.500)

## 2016-02-04 LAB — CBC WITH DIFFERENTIAL/PLATELET
BASOS ABS: 0 10*3/uL (ref 0.0–0.2)
Basos: 0 %
EOS (ABSOLUTE): 0.3 10*3/uL (ref 0.0–0.4)
Eos: 4 %
HEMOGLOBIN: 12.5 g/dL (ref 11.1–15.9)
Hematocrit: 38.7 % (ref 34.0–46.6)
IMMATURE GRANS (ABS): 0 10*3/uL (ref 0.0–0.1)
IMMATURE GRANULOCYTES: 0 %
LYMPHS: 28 %
Lymphocytes Absolute: 1.9 10*3/uL (ref 0.7–3.1)
MCH: 25.8 pg — ABNORMAL LOW (ref 26.6–33.0)
MCHC: 32.3 g/dL (ref 31.5–35.7)
MCV: 80 fL (ref 79–97)
MONOCYTES: 7 %
Monocytes Absolute: 0.5 10*3/uL (ref 0.1–0.9)
NEUTROS PCT: 61 %
Neutrophils Absolute: 4 10*3/uL (ref 1.4–7.0)
PLATELETS: 261 10*3/uL (ref 150–379)
RBC: 4.85 x10E6/uL (ref 3.77–5.28)
RDW: 16.3 % — ABNORMAL HIGH (ref 12.3–15.4)
WBC: 6.7 10*3/uL (ref 3.4–10.8)

## 2016-02-04 LAB — HGB A1C W/O EAG: Hgb A1c MFr Bld: 5.5 % (ref 4.8–5.6)

## 2016-02-15 DIAGNOSIS — Z79899 Other long term (current) drug therapy: Secondary | ICD-10-CM | POA: Diagnosis not present

## 2016-02-15 DIAGNOSIS — F901 Attention-deficit hyperactivity disorder, predominantly hyperactive type: Secondary | ICD-10-CM | POA: Diagnosis not present

## 2016-02-15 DIAGNOSIS — F902 Attention-deficit hyperactivity disorder, combined type: Secondary | ICD-10-CM | POA: Diagnosis not present

## 2016-02-15 DIAGNOSIS — F4310 Post-traumatic stress disorder, unspecified: Secondary | ICD-10-CM | POA: Diagnosis not present

## 2016-02-15 DIAGNOSIS — F410 Panic disorder [episodic paroxysmal anxiety] without agoraphobia: Secondary | ICD-10-CM | POA: Diagnosis not present

## 2016-02-15 DIAGNOSIS — F411 Generalized anxiety disorder: Secondary | ICD-10-CM | POA: Diagnosis not present

## 2016-02-15 DIAGNOSIS — F3189 Other bipolar disorder: Secondary | ICD-10-CM | POA: Diagnosis not present

## 2016-02-24 ENCOUNTER — Other Ambulatory Visit: Payer: Self-pay | Admitting: Orthopedic Surgery

## 2016-02-24 DIAGNOSIS — G8929 Other chronic pain: Secondary | ICD-10-CM

## 2016-02-24 DIAGNOSIS — M25562 Pain in left knee: Principal | ICD-10-CM

## 2016-03-02 ENCOUNTER — Ambulatory Visit (INDEPENDENT_AMBULATORY_CARE_PROVIDER_SITE_OTHER): Payer: Medicare Other | Admitting: Family Medicine

## 2016-03-02 ENCOUNTER — Encounter: Payer: Self-pay | Admitting: Family Medicine

## 2016-03-02 VITALS — BP 116/72 | HR 107 | Temp 97.7°F | Resp 18 | Ht 64.0 in | Wt 248.6 lb

## 2016-03-02 DIAGNOSIS — M542 Cervicalgia: Secondary | ICD-10-CM | POA: Diagnosis not present

## 2016-03-02 DIAGNOSIS — E119 Type 2 diabetes mellitus without complications: Secondary | ICD-10-CM

## 2016-03-02 MED ORDER — IBUPROFEN 600 MG PO TABS: 600.0000 mg | ORAL_TABLET | Freq: Three times a day (TID) | ORAL | 0 refills | Status: DC | PRN

## 2016-03-02 MED ORDER — DULAGLUTIDE 1.5 MG/0.5ML ~~LOC~~ SOAJ: 1.5000 mg | Freq: Every day | SUBCUTANEOUS | 2 refills | Status: DC

## 2016-03-02 NOTE — Progress Notes (Signed)
Name: Wendy Johnson   MRN: [id number]    DOB: 1966/02/27   Date:03/02/2016       Progress Note  Subjective  Chief Complaint  Chief Complaint  Patient presents with  . Follow-up    1 mo  . Medication Refill    HPI  Chronic pain/muscles spasms: she goes to the pain clinic, sees Dr. Andree Elk, she is on Soma and also takes Oxycodone, and he was on Relafen, she would like a refill of Ibuprofen to take at most twice daily prn, explained risk of worsening GI symptoms and or GI bleed.   Obesity: she was doing well, and had lost a lot of weight, she was down to 185 lbs, but has gradually gained weight back and was very frustrated on her last visit in September. She is on Trulicity now and has lost 9 lbs since last visit.  Still going to the gym, having Slim Fast for breakfast, and eating healthier. Encouraged her to continue the hard work   DMII: she was diagnosed years ago with DM and was doing well after weight loss, she denies polyphagia, polydipsia or polyuria, no blurred vision. She has metabolic syndrome. Needs eye exam and urine micro - but can't void today. She is on Trulicity now  Patient Active Problem List   Diagnosis Date Noted  . Occipital neuralgia 10/12/2015  . Medication overuse headache 10/12/2015  . Instability of prosthetic knee ([place]) 09/22/2015  . Primary osteoarthritis of right hip 05/12/2015  . Sleep apnea 04/07/2015  . Primary osteoarthritis of left hip 12/16/2014  . Metabolic syndrome 123XX123  . Migraine without aura and without status migrainosus, not intractable 11/26/2014  . GERD without esophagitis 11/26/2014  . COPD, moderate ([place]) 11/26/2014  . Nocturnal oxygen desaturation 11/26/2014  . Supplemental oxygen dependent 11/26/2014  . Hearing loss 11/26/2014  . Chronic insomnia 11/26/2014  . History of hypertension 11/26/2014  . Dyslipidemia 11/26/2014  . Morbid obesity ([place]) 11/26/2014  . Diabetes mellitus type 2, diet-controlled ([place]) 11/26/2014  .  Chronic constipation 11/26/2014  . Generalized anxiety disorder 11/11/2014  . DDD (degenerative disc disease), lumbar 11/11/2014  . Facet arthritis of lumbar region ([place]) 11/11/2014  . Primary osteoarthritis involving multiple joints 11/11/2014  . H/O hysterectomy for benign disease 10/20/2014  . History of endometriosis 10/16/2014  . Low back derangement syndrome 09/30/2014  . Degenerative joint disease of both lower legs 09/30/2014  . Bipolar disorder ([place]) 09/30/2014    Past Surgical History:  Procedure Laterality Date  . ABDOMINAL HYSTERECTOMY N/A 10/20/2014   Procedure: Total abdominial hysterectomy, bilateral salpingo-oophorectomy;  Surgeon: Brayton Mars, MD;  Location: [facility] ORS;  Service: Gynecology;  Laterality: N/A;  . BILATERAL SALPINGOOPHORECTOMY    . bone spurs removed Bilateral   . FOOT SURGERY Bilateral   . JOINT REPLACEMENT Right    Total Knee replacement X 2  . JOINT REPLACEMENT Bilateral    Total Hip Replacement  . knee arthroscopo Right   . LAPAROSCOPY  09/22/2014   Procedure: LAPAROSCOPY OPERATIVE;  Surgeon: Brayton Mars, MD;  Location: [facility] ORS;  Service: Gynecology;;  excision and fulgeration of endomertriosis  . LIPOMA EXCISION    . TONSILLECTOMY    . TOTAL HIP ARTHROPLASTY Left 12/16/2014   Procedure: TOTAL HIP ARTHROPLASTY ANTERIOR APPROACH;  Surgeon: Hessie Knows, MD;  Location: [facility] ORS;  Service: Orthopedics;  Laterality: Left;  . TOTAL HIP ARTHROPLASTY Right 05/12/2015   Procedure: TOTAL HIP ARTHROPLASTY ANTERIOR APPROACH;  Surgeon: Hessie Knows, MD;  Location: [facility]  ORS;  Service: Orthopedics;  Laterality: Right;  . TOTAL KNEE REVISION Right 09/22/2015   Procedure: TOTAL KNEE REVISION/ REVISE POLYIETHYLENE;  Surgeon: Hessie Knows, MD;  Location: [facility] ORS;  Service: Orthopedics;  Laterality: Right;  . TUBAL LIGATION      Family History  Problem Relation Age of Onset  . Diabetes Mother   . Heart disease Father   . Heart attack Father   .  Diabetes Sister     Social History   Social History  . Marital status: Divorced    Spouse name: N/A  . Number of children: N/A  . Years of education: N/A   Occupational History  . Not on file.   Social History Main Topics  . Smoking status: Current Every Day Smoker    Packs/day: 1.00    Years: 35.00    Types: Cigarettes    Start date: 11/26/1979  . Smokeless tobacco: Never Used  . Alcohol use No  . Drug use: No     Comment: quit crack cocaine 2004  . Sexual activity: Yes    Partners: Male    Birth control/ protection: None   Other Topics Concern  . Not on file   Social History Narrative  . No narrative on file     Current Outpatient Prescriptions:  .  amphetamine-dextroamphetamine (ADDERALL) 30 MG tablet, Take 30 mg by mouth 2 (two) times daily., Disp: , Rfl:  .  ARIPiprazole (ABILIFY) 15 MG tablet, Take 15 mg by mouth at bedtime., Disp: , Rfl:  .  aspirin EC 325 MG tablet, Take 1 tablet (325 mg total) by mouth daily., Disp: 30 tablet, Rfl: 0 .  carisoprodol (SOMA) 350 MG tablet, Take 1 tablet (350 mg total) by mouth at bedtime., Disp: 30 tablet, Rfl: 2 .  clonazePAM (KLONOPIN) 1 MG tablet, Take 1-2 mg by mouth 3 (three) times daily as needed for anxiety., Disp: , Rfl:  .  dexlansoprazole (DEXILANT) 60 MG capsule, Take 1 capsule (60 mg total) by mouth daily., Disp: 30 capsule, Rfl: 5 .  Dulaglutide (TRULICITY) 1.5 0000000 SOPN, Inject 1.5 mg into the skin daily., Disp: 4 pen, Rfl: 0 .  Oxycodone HCl 10 MG TABS, Take 1 tablet (10 mg total) by mouth QID., Disp: 105 tablet, Rfl: 0 .  sertraline (ZOLOFT) 50 MG tablet, Take 25 mg by mouth 2 (two) times daily., Disp: , Rfl:  .  tiotropium (SPIRIVA) 18 MCG inhalation capsule, Place 1 capsule (18 mcg total) into inhaler and inhale daily., Disp: 30 capsule, Rfl: 5 .  traZODone (DESYREL) 150 MG tablet, Take 300 mg by mouth at bedtime as needed for sleep., Disp: , Rfl:   Allergies  Allergen Reactions  . Codeine Nausea Only  .  Imitrex [Sumatriptan] Other (See Comments)    Chest pain     ROS  Ten systems reviewed and is negative except as mentioned in HPI   Objective  Vitals:   03/02/16 0738  BP: 116/72  Pulse: (!) 107  Resp: 18  Temp: 97.7 F (36.5 C)  TempSrc: Oral  SpO2: 97%  Weight: 248 lb 9.6 oz (112.8 kg)  Height: 5\' 4"  (1.626 m)    Body mass index is 42.67 kg/m.  Physical Exam  Constitutional: Patient appears well-developed and well-nourished. Obese No distress.  HEENT: head atraumatic, normocephalic, pupils equal and reactive to light, , neck supple, throat within normal limits Cardiovascular: Normal rate, regular rhythm and normal heart sounds.  No murmur heard. No BLE edema. Pulmonary/Chest: Effort normal  and breath sounds normal. No respiratory distress. Abdominal: Soft.  There is no tenderness. Psychiatric: Patient has a normal mood and affect. behavior is normal. Judgment and thought content normal.  Recent Results (from the past 2160 hour(s))  CBC with Differential/Platelet     Status: Abnormal   Collection Time: 02/03/16 11:16 AM  Result Value Ref Range   WBC 6.7 3.4 - 10.8 x10E3/uL   RBC 4.85 3.77 - 5.28 x10E6/uL   Hemoglobin 12.5 11.1 - 15.9 g/dL   Hematocrit 38.7 34.0 - 46.6 %   MCV 80 79 - 97 fL   MCH 25.8 (L) 26.6 - 33.0 pg   MCHC 32.3 31.5 - 35.7 g/dL   RDW 16.3 (H) 12.3 - 15.4 %   Platelets 261 150 - 379 x10E3/uL   Neutrophils 61 %   Lymphs 28 %   Monocytes 7 %   Eos 4 %   Basos 0 %   Neutrophils Absolute 4.0 1.4 - 7.0 x10E3/uL   Lymphocytes Absolute 1.9 0.7 - 3.1 x10E3/uL   Monocytes Absolute 0.5 0.1 - 0.9 x10E3/uL   EOS (ABSOLUTE) 0.3 0.0 - 0.4 x10E3/uL   Basophils Absolute 0.0 0.0 - 0.2 x10E3/uL   Immature Granulocytes 0 %   Immature Grans (Abs) 0.0 0.0 - 0.1 x10E3/uL  Comprehensive metabolic panel     Status: Abnormal   Collection Time: 02/03/16 11:16 AM  Result Value Ref Range   Glucose 95 65 - 99 mg/dL   BUN 12 6 - 24 mg/dL   Creatinine, Ser 0.55  (L) 0.57 - 1.00 mg/dL   GFR calc non Af Amer 111 >59 mL/min/1.73   GFR calc Af Amer 128 >59 mL/min/1.73   BUN/Creatinine Ratio 22 9 - 23   Sodium 140 134 - 144 mmol/L   Potassium 4.9 3.5 - 5.2 mmol/L   Chloride 98 96 - 106 mmol/L   CO2 27 18 - 29 mmol/L   Calcium 9.7 8.7 - 10.2 mg/dL   Total Protein 7.1 6.0 - 8.5 g/dL   Albumin 4.3 3.5 - 5.5 g/dL   Globulin, Total 2.8 1.5 - 4.5 g/dL   Albumin/Globulin Ratio 1.5 1.2 - 2.2   Bilirubin Total <0.2 0.0 - 1.2 mg/dL   Alkaline Phosphatase 113 39 - 117 IU/L   AST 17 0 - 40 IU/L   ALT 14 0 - 32 IU/L  Hgb A1c w/o eAG     Status: None   Collection Time: 02/03/16 11:16 AM  Result Value Ref Range   Hgb A1c MFr Bld 5.5 4.8 - 5.6 %    Comment:          Pre-diabetes: 5.7 - 6.4          Diabetes: >6.4          Glycemic control for adults with diabetes: <7.0   TSH     Status: None   Collection Time: 02/03/16 11:16 AM  Result Value Ref Range   TSH 0.769 0.450 - 4.500 uIU/mL      PHQ2/9: Depression screen [facility] 2/9 03/02/2016 02/03/2016 01/05/2016 11/18/2015 10/02/2015  Decreased Interest 0 0 0 0 0  Down, Depressed, Hopeless 0 0 0 0 0  PHQ - 2 Score 0 0 0 0 0    Fall Risk: Fall Risk  03/02/2016 02/03/2016 11/18/2015 10/02/2015 09/21/2015  Falls in the past year? No No No No No  Number falls in past yr: - - - - -  Injury with Fall? - - - - -  Risk for  fall due to : - - - - -  Follow up - - - - -     Functional Status Survey: Is the patient deaf or have difficulty hearing?: Yes (deaf in right ear) Does the patient have difficulty seeing, even when wearing glasses/contacts?: No Does the patient have difficulty concentrating, remembering, or making decisions?: No Does the patient have difficulty walking or climbing stairs?: Yes (leg pain) Does the patient have difficulty dressing or bathing?: No Does the patient have difficulty doing errands alone such as visiting a doctor's office or shopping?: No    Assessment & Plan  1. Diabetes  mellitus type 2, diet-controlled (HCC)  - Dulaglutide (TRULICITY) 1.5 0000000 SOPN; Inject 1.5 mg into the skin daily.  Dispense: 4 pen; Refill: 2  2. Morbid obesity, unspecified obesity type ([place])  Doing well, feeling full when she eats now - Dulaglutide (TRULICITY) 1.5 0000000 SOPN; Inject 1.5 mg into the skin daily.  Dispense: 4 pen; Refill: 2  3. Neck pain  - ibuprofen (ADVIL,MOTRIN) 600 MG tablet; Take 1 tablet (600 mg total) by mouth every 8 (eight) hours as needed.  Dispense: 90 tablet; Refill: 0

## 2016-03-12 ENCOUNTER — Ambulatory Visit
Admission: RE | Admit: 2016-03-12 | Discharge: 2016-03-12 | Disposition: A | Discharge status: Home / Self Care | Payer: Medicare Other | Source: Ambulatory Visit | Attending: Orthopedic Surgery | Admitting: Orthopedic Surgery

## 2016-03-12 DIAGNOSIS — M659 Synovitis and tenosynovitis, unspecified: Secondary | ICD-10-CM | POA: Insufficient documentation

## 2016-03-12 DIAGNOSIS — G8929 Other chronic pain: Secondary | ICD-10-CM | POA: Insufficient documentation

## 2016-03-12 DIAGNOSIS — M25562 Pain in left knee: Secondary | ICD-10-CM | POA: Insufficient documentation

## 2016-03-12 DIAGNOSIS — M94262 Chondromalacia, left knee: Secondary | ICD-10-CM | POA: Insufficient documentation

## 2016-03-12 DIAGNOSIS — M25462 Effusion, left knee: Secondary | ICD-10-CM | POA: Insufficient documentation

## 2016-03-14 ENCOUNTER — Encounter: Payer: Self-pay | Admitting: Anesthesiology

## 2016-03-14 ENCOUNTER — Ambulatory Visit: Payer: Medicare Other | Attending: Anesthesiology | Admitting: Anesthesiology

## 2016-03-14 VITALS — BP 133/85 | HR 96 | Temp 97.8°F | Resp 18 | Ht 64.0 in | Wt 240.0 lb

## 2016-03-14 DIAGNOSIS — Z842 Family history of other diseases of the genitourinary system: Secondary | ICD-10-CM

## 2016-03-14 DIAGNOSIS — M5136 Other intervertebral disc degeneration, lumbar region: Secondary | ICD-10-CM | POA: Diagnosis not present

## 2016-03-14 DIAGNOSIS — M961 Postlaminectomy syndrome, not elsewhere classified: Secondary | ICD-10-CM | POA: Insufficient documentation

## 2016-03-14 DIAGNOSIS — M542 Cervicalgia: Secondary | ICD-10-CM | POA: Diagnosis not present

## 2016-03-14 DIAGNOSIS — M5386 Other specified dorsopathies, lumbar region: Secondary | ICD-10-CM

## 2016-03-14 DIAGNOSIS — M545 Low back pain: Secondary | ICD-10-CM

## 2016-03-14 DIAGNOSIS — M4692 Unspecified inflammatory spondylopathy, cervical region: Secondary | ICD-10-CM | POA: Diagnosis not present

## 2016-03-14 DIAGNOSIS — M47812 Spondylosis without myelopathy or radiculopathy, cervical region: Secondary | ICD-10-CM

## 2016-03-14 DIAGNOSIS — G8918 Other acute postprocedural pain: Secondary | ICD-10-CM | POA: Insufficient documentation

## 2016-03-14 DIAGNOSIS — Z8489 Family history of other specified conditions: Secondary | ICD-10-CM | POA: Diagnosis not present

## 2016-03-14 DIAGNOSIS — M159 Polyosteoarthritis, unspecified: Secondary | ICD-10-CM

## 2016-03-14 DIAGNOSIS — M170 Bilateral primary osteoarthritis of knee: Secondary | ICD-10-CM | POA: Diagnosis not present

## 2016-03-14 DIAGNOSIS — M8949 Other hypertrophic osteoarthropathy, multiple sites: Secondary | ICD-10-CM

## 2016-03-14 DIAGNOSIS — M150 Primary generalized (osteo)arthritis: Secondary | ICD-10-CM

## 2016-03-14 MED ORDER — OXYCODONE HCL 10 MG PO TABS: 10.0000 mg | ORAL_TABLET | Freq: Four times a day (QID) | ORAL | 0 refills | Status: DC

## 2016-03-14 NOTE — Progress Notes (Signed)
Safety precautions to be maintained throughout the outpatient stay will include: orient to surroundings, keep bed in low position, maintain call bell within reach at all times, provide assistance with transfer out of bed and ambulation.  

## 2016-03-15 NOTE — Progress Notes (Signed)
Subjective:    Patient ID: Wendy Johnson, female    DOB: 12/12/1965, 50 y.o.   MRN: [id number]    Chief complaint: Chronic low back pain. Chronic bilateral lower extremity pain and diffuse body pain  HPI: [name] presents for reevaluation last seen a few months ago. She continues to take her oxycodone 10 mg 3-4 times a day. Based on her narcotic assessment sheet this seems to work well for her but on the days where she takes 3 she generally breaks through after about 4-6 hours. She therefore has frequent gaps that she reports where she is having considerable pain. She is also scheduled for a repeat evaluation with Dr. Youlanda Mighty regarding her left knee. She is status post a total knee replacement and he is going to evaluate the status of her knee replacement versus possible knee scoping. Otherwise the quality characteristic addition we should've her pain remained stable in nature.    BP 133/85   Pulse 96   Temp 97.8 F (36.6 C) (Oral)   Resp 18   Ht 5\' 4"  (1.626 m)   Wt 240 lb (108.9 kg)   LMP 09/26/2014   SpO2 98%   BMI 41.20 kg/m     Current Outpatient Prescriptions:  .  amphetamine-dextroamphetamine (ADDERALL) 30 MG tablet, Take 30 mg by mouth 2 (two) times daily., Disp: , Rfl:  .  ARIPiprazole (ABILIFY) 15 MG tablet, Take 15 mg by mouth at bedtime., Disp: , Rfl:  .  aspirin EC 325 MG tablet, Take 1 tablet (325 mg total) by mouth daily., Disp: 30 tablet, Rfl: 0 .  carisoprodol (SOMA) 350 MG tablet, Take 1 tablet (350 mg total) by mouth at bedtime., Disp: 30 tablet, Rfl: 2 .  clonazePAM (KLONOPIN) 1 MG tablet, Take 1-2 mg by mouth 3 (three) times daily as needed for anxiety., Disp: , Rfl:  .  dexlansoprazole (DEXILANT) 60 MG capsule, Take 1 capsule (60 mg total) by mouth daily., Disp: 30 capsule, Rfl: 5 .  Dulaglutide (TRULICITY) 1.5 0000000 SOPN, Inject 1.5 mg into the skin daily., Disp: 4 pen, Rfl: 2 .  ibuprofen (ADVIL,MOTRIN) 600 MG tablet, Take 1 tablet (600 mg total) by mouth every  8 (eight) hours as needed., Disp: 90 tablet, Rfl: 0 .  Oxycodone HCl 10 MG TABS, Take 1 tablet (10 mg total) by mouth QID., Disp: 120 tablet, Rfl: 0 .  sertraline (ZOLOFT) 50 MG tablet, Take 25 mg by mouth 2 (two) times daily., Disp: , Rfl:  .  tiotropium (SPIRIVA) 18 MCG inhalation capsule, Place 1 capsule (18 mcg total) into inhaler and inhale daily., Disp: 30 capsule, Rfl: 5 .  traZODone (DESYREL) 150 MG tablet, Take 300 mg by mouth at bedtime as needed for sleep., Disp: , Rfl:    Patient Active Problem List   Diagnosis Date Noted  . Occipital neuralgia 10/12/2015  . Medication overuse headache 10/12/2015  . Instability of prosthetic knee ([place]) 09/22/2015  . Primary osteoarthritis of right hip 05/12/2015  . Sleep apnea 04/07/2015  . Primary osteoarthritis of left hip 12/16/2014  . Metabolic syndrome 123XX123  . Migraine without aura and without status migrainosus, not intractable 11/26/2014  . GERD without esophagitis 11/26/2014  . COPD, moderate ([place]) 11/26/2014  . Nocturnal oxygen desaturation 11/26/2014  . Supplemental oxygen dependent 11/26/2014  . Hearing loss 11/26/2014  . Chronic insomnia 11/26/2014  . History of hypertension 11/26/2014  . Dyslipidemia 11/26/2014  . Morbid obesity ([place]) 11/26/2014  . Diabetes mellitus type 2, diet-controlled ([place])  11/26/2014  . Chronic constipation 11/26/2014  . Generalized anxiety disorder 11/11/2014  . DDD (degenerative disc disease), lumbar 11/11/2014  . Facet arthritis of lumbar region ([place]) 11/11/2014  . Primary osteoarthritis involving multiple joints 11/11/2014  . H/O hysterectomy for benign disease 10/20/2014  . History of endometriosis 10/16/2014  . Low back derangement syndrome 09/30/2014  . Degenerative joint disease of both lower legs 09/30/2014  . Bipolar disorder ([place]) 09/30/2014     Review of Systems  Review of systems negative for cardiovascular negative for pulmonary she snores if her neurologic positive for  anxiety depression panic attacks and insomnia she also has bipolar disease as CD with a history of reflux negative for GU negative for hematologic negative for endocrine negative for rheumatologic  Social history is positive for being divorced with one child she smokes pack cigarettes per day for doing this for 12 years  Currently disabled since 2006.  Family history is positive for alcoholism diabetes and high blood pressure     Objective:   Physical Exam  Patient is 50 year old white female in no acute distress is alert and oriented 3 and compliant with examination. Her heart is regular rate and rhythm.     Assessment & Plan:  #1. Chronic low back pain with low back syndrome and degenerative disc disease  #2. Diffuse body pain with osteoarthritis  #3. Degenerative joint disease with severe left hip pain and right hip pain And bilateral knee pain  #4. History of bipolar disorder  #5. History of anxiety depression on chronic opioid management  Plan:We will refill Her medications today . Based on the patient's request and after significant consideration in counseling I'm going to allow her to increase her oxycodone to 1 tablet 4 times a day. We'll have return to clinic in 2 months for reevaluation..  Dr. Vashti Hey

## 2016-03-17 ENCOUNTER — Other Ambulatory Visit: Payer: Self-pay | Admitting: Family Medicine

## 2016-03-17 ENCOUNTER — Telehealth: Payer: Self-pay | Admitting: Family Medicine

## 2016-03-17 NOTE — Telephone Encounter (Signed)
Pt needs refill on [facility] to [facility] called [facility].

## 2016-03-19 LAB — TOXASSURE SELECT 13 (MW), URINE

## 2016-03-29 ENCOUNTER — Other Ambulatory Visit: Payer: Self-pay | Admitting: Family Medicine

## 2016-03-29 DIAGNOSIS — Z1231 Encounter for screening mammogram for malignant neoplasm of breast: Secondary | ICD-10-CM

## 2016-03-30 DIAGNOSIS — M1712 Unilateral primary osteoarthritis, left knee: Secondary | ICD-10-CM | POA: Diagnosis not present

## 2016-04-05 ENCOUNTER — Ambulatory Visit: Payer: Medicare Other | Admitting: Family Medicine

## 2016-04-29 ENCOUNTER — Encounter: Payer: Self-pay | Admitting: Family Medicine

## 2016-04-29 ENCOUNTER — Ambulatory Visit (INDEPENDENT_AMBULATORY_CARE_PROVIDER_SITE_OTHER): Payer: Medicare Other | Admitting: Family Medicine

## 2016-04-29 VITALS — BP 118/68 | HR 100 | Temp 97.0°F | Resp 16 | Ht 64.0 in | Wt 257.1 lb

## 2016-04-29 DIAGNOSIS — J441 Chronic obstructive pulmonary disease with (acute) exacerbation: Secondary | ICD-10-CM | POA: Diagnosis not present

## 2016-04-29 MED ORDER — AMOXICILLIN-POT CLAVULANATE 875-125 MG PO TABS: 1.0000 | ORAL_TABLET | Freq: Two times a day (BID) | ORAL | 0 refills | Status: DC

## 2016-04-29 MED ORDER — FLUTICASONE-SALMETEROL 100-50 MCG/DOSE IN AEPB: 1.0000 | INHALATION_SPRAY | Freq: Two times a day (BID) | RESPIRATORY_TRACT | 0 refills | Status: DC

## 2016-04-29 NOTE — Progress Notes (Signed)
Name: Wendy Johnson   MRN: [id number]    DOB: 1965-05-27   Date:04/29/2016       Progress Note  Subjective  Chief Complaint  Chief Complaint  Patient presents with  . COPD    2 month follow up pt had the flu a couple of weeks ago and has been using her inhalers and nebulizer as needed    HPI  COPD exacerbation: she states she developed severe URI possible flu two weeks ago. She states initially fever, chills, rhinorrhea, nasal congestion and coughing. All symptoms improved except for cough, that is productive, yellow and thick, she has wheezing that is worse at night and when she first gets up in am's. She has been using Spiriva. She denies SOB. Using oxygen machine, but she would prefer wearing a mask.    Patient Active Problem List   Diagnosis Date Noted  . Occipital neuralgia 10/12/2015  . Medication overuse headache 10/12/2015  . Instability of prosthetic knee ([place]) 09/22/2015  . Primary osteoarthritis of right hip 05/12/2015  . Sleep apnea 04/07/2015  . Primary osteoarthritis of left hip 12/16/2014  . Metabolic syndrome [id number]  . Migraine without aura and without status migrainosus, not intractable 11/26/2014  . GERD without esophagitis 11/26/2014  . COPD, moderate ([place]) 11/26/2014  . Nocturnal oxygen desaturation 11/26/2014  . Supplemental oxygen dependent 11/26/2014  . Hearing loss 11/26/2014  . Chronic insomnia 11/26/2014  . History of hypertension 11/26/2014  . Dyslipidemia 11/26/2014  . Morbid obesity ([place]) 11/26/2014  . Diabetes mellitus type 2, diet-controlled ([place]) 11/26/2014  . Chronic constipation 11/26/2014  . Generalized anxiety disorder 11/11/2014  . DDD (degenerative disc disease), lumbar 11/11/2014  . Facet arthritis of lumbar region ([place]) 11/11/2014  . Primary osteoarthritis involving multiple joints 11/11/2014  . H/O hysterectomy for benign disease 10/20/2014  . History of endometriosis 10/16/2014  . Low back derangement syndrome 09/30/2014  .  Degenerative joint disease of both lower legs 09/30/2014  . Bipolar disorder ([place]) 09/30/2014    Past Surgical History:  Procedure Laterality Date  . ABDOMINAL HYSTERECTOMY N/A 10/20/2014   Procedure: Total abdominial hysterectomy, bilateral salpingo-oophorectomy;  Surgeon: Brayton Mars, MD;  Location: [facility];  Service: Gynecology;  Laterality: N/A;  . BILATERAL SALPINGOOPHORECTOMY    . bone spurs removed Bilateral   . FOOT SURGERY Bilateral   . JOINT REPLACEMENT Right    Total Knee replacement X 2  . JOINT REPLACEMENT Bilateral    Total Hip Replacement  . knee arthroscopo Right   . LAPAROSCOPY  09/22/2014   Procedure: LAPAROSCOPY OPERATIVE;  Surgeon: Brayton Mars, MD;  Location: [facility];  Service: Gynecology;;  excision and fulgeration of endomertriosis  . LIPOMA EXCISION    . TONSILLECTOMY    . TOTAL HIP ARTHROPLASTY Left 12/16/2014   Procedure: TOTAL HIP ARTHROPLASTY ANTERIOR APPROACH;  Surgeon: Hessie Knows, MD;  Location: [facility];  Service: Orthopedics;  Laterality: Left;  . TOTAL HIP ARTHROPLASTY Right 05/12/2015   Procedure: TOTAL HIP ARTHROPLASTY ANTERIOR APPROACH;  Surgeon: Hessie Knows, MD;  Location: [facility];  Service: Orthopedics;  Laterality: Right;  . TOTAL KNEE REVISION Right 09/22/2015   Procedure: TOTAL KNEE REVISION/ REVISE POLYIETHYLENE;  Surgeon: Hessie Knows, MD;  Location: [facility];  Service: Orthopedics;  Laterality: Right;  . TUBAL LIGATION      Family History  Problem Relation Age of Onset  . Diabetes Mother   . Heart disease Father   . Heart attack Father   . Diabetes Sister  Social History   Social History  . Marital status: Divorced    Spouse name: N/A  . Number of children: N/A  . Years of education: N/A   Occupational History  . Not on file.   Social History Main Topics  . Smoking status: Current Every Day Smoker    Packs/day: 1.00    Years: 35.00    Types: Cigarettes    Start date: 11/26/1979  . Smokeless tobacco:  Never Used  . Alcohol use No  . Drug use: No     Comment: quit crack cocaine 2004  . Sexual activity: Yes    Partners: Male    Birth control/ protection: None   Other Topics Concern  . Not on file   Social History Narrative  . No narrative on file     Current Outpatient Prescriptions:  .  amoxicillin-clavulanate (AUGMENTIN) 875-125 MG tablet, Take 1 tablet by mouth 2 (two) times daily., Disp: 14 tablet, Rfl: 0 .  amphetamine-dextroamphetamine (ADDERALL) 30 MG tablet, Take 30 mg by mouth 2 (two) times daily., Disp: , Rfl:  .  ARIPiprazole (ABILIFY) 15 MG tablet, Take 15 mg by mouth at bedtime., Disp: , Rfl:  .  aspirin EC 325 MG tablet, Take 1 tablet (325 mg total) by mouth daily., Disp: 30 tablet, Rfl: 0 .  busPIRone (BUSPAR) 30 MG tablet, Take 1 tablet by mouth every evening., Disp: , Rfl:  .  carisoprodol (SOMA) 350 MG tablet, Take 1 tablet (350 mg total) by mouth at bedtime., Disp: 30 tablet, Rfl: 2 .  clonazePAM (KLONOPIN) 1 MG tablet, Take 1-2 mg by mouth 3 (three) times daily as needed for anxiety., Disp: , Rfl:  .  dexlansoprazole (DEXILANT) 60 MG capsule, Take 1 capsule (60 mg total) by mouth daily., Disp: 30 capsule, Rfl: 5 .  Dulaglutide (TRULICITY) 1.5 0000000 SOPN, Inject 1.5 mg into the skin daily., Disp: 4 pen, Rfl: 2 .  Fluticasone-Salmeterol (ADVAIR) 100-50 MCG/DOSE AEPB, Inhale 1 puff into the lungs 2 (two) times daily., Disp: 1 each, Rfl: 0 .  ibuprofen (ADVIL,MOTRIN) 600 MG tablet, Take 1 tablet (600 mg total) by mouth every 8 (eight) hours as needed., Disp: 90 tablet, Rfl: 0 .  Oxycodone HCl 10 MG TABS, Take 1 tablet (10 mg total) by mouth QID., Disp: 120 tablet, Rfl: 0 .  sertraline (ZOLOFT) 50 MG tablet, Take 50 mg by mouth 2 (two) times daily., Disp: , Rfl:  .  tiotropium (SPIRIVA) 18 MCG inhalation capsule, Place 1 capsule (18 mcg total) into inhaler and inhale daily., Disp: 30 capsule, Rfl: 5 .  traZODone (DESYREL) 150 MG tablet, Take 300 mg by mouth at  bedtime as needed for sleep., Disp: , Rfl:  .  zaleplon (SONATA) 10 MG capsule, , Disp: , Rfl:   Allergies  Allergen Reactions  . Codeine Nausea Only  . Imitrex [Sumatriptan] Other (See Comments)    Chest pain     ROS  Constitutional: Negative for fever, positive for  weight change.  Respiratory: Negative for cough and shortness of breath.   Cardiovascular: Negative for chest pain or palpitations.  Gastrointestinal: Negative for abdominal pain, no bowel changes.  Musculoskeletal: Negative for gait problem or joint swelling.  Skin: Negative for rash.  Neurological: Negative for dizziness or headache.  No other specific complaints in a complete review of systems (except as listed in HPI above).  Objective  Vitals:   04/29/16 1004  BP: 118/68  Pulse: 100  Resp: 16  Temp: 97 F (  36.1 C)  SpO2: 98%  Weight: 257 lb 2 oz (116.6 kg)  Height: 5\' 4"  (1.626 m)    Body mass index is 44.14 kg/m.  Physical Exam  Constitutional: Patient appears well-developed and well-nourished. Obese No distress.  HEENT: head atraumatic, normocephalic, pupils equal and reactive to light,  neck supple, throat within normal limits Cardiovascular: Normal rate, regular rhythm and normal heart sounds.  No murmur heard. No BLE edema. Pulmonary/Chest: Effort normal, bilateral rhonchi, no wheezing or crackles. No respiratory distress. Abdominal: Soft.  There is no tenderness. Psychiatric: Patient has a normal mood and affect. behavior is normal. Judgment and thought content normal.   Recent Results (from the past 2160 hour(s))  CBC with Differential/Platelet     Status: Abnormal   Collection Time: 02/03/16 11:16 AM  Result Value Ref Range   WBC 6.7 3.4 - 10.8 x10E3/uL   RBC 4.85 3.77 - 5.28 x10E6/uL   Hemoglobin 12.5 11.1 - 15.9 g/dL   Hematocrit 38.7 34.0 - 46.6 %   MCV 80 79 - 97 fL   MCH 25.8 (L) 26.6 - 33.0 pg   MCHC 32.3 31.5 - 35.7 g/dL   RDW 16.3 (H) 12.3 - 15.4 %   Platelets 261 150 -  379 x10E3/uL   Neutrophils 61 %   Lymphs 28 %   Monocytes 7 %   Eos 4 %   Basos 0 %   Neutrophils Absolute 4.0 1.4 - 7.0 x10E3/uL   Lymphocytes Absolute 1.9 0.7 - 3.1 x10E3/uL   Monocytes Absolute 0.5 0.1 - 0.9 x10E3/uL   EOS (ABSOLUTE) 0.3 0.0 - 0.4 x10E3/uL   Basophils Absolute 0.0 0.0 - 0.2 x10E3/uL   Immature Granulocytes 0 %   Immature Grans (Abs) 0.0 0.0 - 0.1 x10E3/uL  Comprehensive metabolic panel     Status: Abnormal   Collection Time: 02/03/16 11:16 AM  Result Value Ref Range   Glucose 95 65 - 99 mg/dL   BUN 12 6 - 24 mg/dL   Creatinine, Ser 0.55 (L) 0.57 - 1.00 mg/dL   GFR calc non Af Amer 111 >59 mL/min/1.73   GFR calc Af Amer 128 >59 mL/min/1.73   BUN/Creatinine Ratio 22 9 - 23   Sodium 140 134 - 144 mmol/L   Potassium 4.9 3.5 - 5.2 mmol/L   Chloride 98 96 - 106 mmol/L   CO2 27 18 - 29 mmol/L   Calcium 9.7 8.7 - 10.2 mg/dL   Total Protein 7.1 6.0 - 8.5 g/dL   Albumin 4.3 3.5 - 5.5 g/dL   Globulin, Total 2.8 1.5 - 4.5 g/dL   Albumin/Globulin Ratio 1.5 1.2 - 2.2   Bilirubin Total <0.2 0.0 - 1.2 mg/dL   Alkaline Phosphatase 113 39 - 117 IU/L   AST 17 0 - 40 IU/L   ALT 14 0 - 32 IU/L  Hgb A1c w/o eAG     Status: None   Collection Time: 02/03/16 11:16 AM  Result Value Ref Range   Hgb A1c MFr Bld 5.5 4.8 - 5.6 %    Comment:          Pre-diabetes: 5.7 - 6.4          Diabetes: >6.4          Glycemic control for adults with diabetes: <7.0   TSH     Status: None   Collection Time: 02/03/16 11:16 AM  Result Value Ref Range   TSH 0.769 0.450 - 4.500 uIU/mL  ToxASSURE Select 13 (MW), Urine  Status: None   Collection Time: 03/14/16  3:11 PM  Result Value Ref Range   ToxAssure Select 13 FINAL     Comment: ==================================================================== TOXASSURE SELECT 13 (MW) ==================================================================== Test                             Result       Flag       Units Drug Present and Declared for  Prescription Verification   7-aminoclonazepam              316          EXPECTED   ng/mg creat    7-aminoclonazepam is an expected metabolite of clonazepam. Source    of clonazepam is a scheduled prescription medication.   Oxycodone                      2480         EXPECTED   ng/mg creat   Oxymorphone                    3024         EXPECTED   ng/mg creat   Noroxycodone                   4831         EXPECTED   ng/mg creat   Noroxymorphone                 736          EXPECTED   ng/mg creat    Sources of oxycodone are scheduled prescription medications.    Oxymorphone, noroxycodone, and noroxymorphone are expected    metabolites of oxycodone. Oxymorphone is also available as a    scheduled prescription medication. Dru g Absent but Declared for Prescription Verification   Amphetamine                    Not Detected UNEXPECTED ng/mg creat ==================================================================== Test                      Result    Flag   Units      Ref Range   Creatinine              45               mg/dL      >=20 ==================================================================== Declared Medications:  The flagging and interpretation on this report are based on the  following declared medications.  Unexpected results may arise from  inaccuracies in the declared medications.  **Note: The testing scope of this panel includes these medications:  Amphetamine (Adderall)  Clonazepam (Klonopin)  Oxycodone  **Note: The testing scope of this panel does not include following  reported medications:  Aripiprazole (Abilify)  Aspirin  Carisoprodol (Soma)  Dexlansoprazole (Dexilant)  Dulaglutide  Ibuprofen  Sertraline (Zoloft)  Tiotropium (Spiriva)  Trazodone ================= =================================================== For clinical consultation, please call [phone]. ====================================================================       PHQ2/9: Depression screen [facility] 2/9 04/29/2016 03/14/2016 03/02/2016 02/03/2016 01/05/2016  Decreased Interest 0 0 0 0 0  Down, Depressed, Hopeless 0 0 0 0 0  PHQ - 2 Score 0 0 0 0 0    Fall Risk: Fall Risk  04/29/2016 03/14/2016 03/02/2016 02/03/2016 11/18/2015  Falls in the past year? No No No No No  Number falls in past yr: - - - - -  Injury with Fall? - - - - -  Risk for fall due to : - - - - -  Follow up - - - - -     Functional Status Survey: Is the patient deaf or have difficulty hearing?: No Does the patient have difficulty seeing, even when wearing glasses/contacts?: No Does the patient have difficulty concentrating, remembering, or making decisions?: No Does the patient have difficulty walking or climbing stairs?: Yes Does the patient have difficulty dressing or bathing?: No Does the patient have difficulty doing errands alone such as visiting a doctor's office or shopping?: No   Assessment & Plan  1. COPD with acute exacerbation ([place])  We will try adding Advair and Augmenting, return for follow up if no improvement of symptoms.  - Fluticasone-Salmeterol (ADVAIR) 100-50 MCG/DOSE AEPB; Inhale 1 puff into the lungs 2 (two) times daily.  Dispense: 1 each; Refill: 0 - amoxicillin-clavulanate (AUGMENTIN) 875-125 MG tablet; Take 1 tablet by mouth 2 (two) times daily.  Dispense: 14 tablet; Refill: 0

## 2016-05-04 DIAGNOSIS — M1712 Unilateral primary osteoarthritis, left knee: Secondary | ICD-10-CM | POA: Diagnosis not present

## 2016-05-05 ENCOUNTER — Encounter: Payer: Self-pay | Admitting: Anesthesiology

## 2016-05-05 ENCOUNTER — Ambulatory Visit: Payer: Medicare Other | Attending: Anesthesiology | Admitting: Anesthesiology

## 2016-05-05 VITALS — BP 147/76 | HR 93 | Resp 16 | Ht 64.0 in | Wt 245.0 lb

## 2016-05-05 DIAGNOSIS — M160 Bilateral primary osteoarthritis of hip: Secondary | ICD-10-CM | POA: Diagnosis not present

## 2016-05-05 DIAGNOSIS — M4696 Unspecified inflammatory spondylopathy, lumbar region: Secondary | ICD-10-CM

## 2016-05-05 DIAGNOSIS — Z7982 Long term (current) use of aspirin: Secondary | ICD-10-CM | POA: Diagnosis not present

## 2016-05-05 DIAGNOSIS — F909 Attention-deficit hyperactivity disorder, unspecified type: Secondary | ICD-10-CM | POA: Diagnosis not present

## 2016-05-05 DIAGNOSIS — M159 Polyosteoarthritis, unspecified: Secondary | ICD-10-CM

## 2016-05-05 DIAGNOSIS — M545 Low back pain: Secondary | ICD-10-CM

## 2016-05-05 DIAGNOSIS — M542 Cervicalgia: Secondary | ICD-10-CM

## 2016-05-05 DIAGNOSIS — M150 Primary generalized (osteo)arthritis: Secondary | ICD-10-CM

## 2016-05-05 DIAGNOSIS — G8918 Other acute postprocedural pain: Secondary | ICD-10-CM

## 2016-05-05 DIAGNOSIS — F418 Other specified anxiety disorders: Secondary | ICD-10-CM | POA: Insufficient documentation

## 2016-05-05 DIAGNOSIS — M170 Bilateral primary osteoarthritis of knee: Secondary | ICD-10-CM | POA: Diagnosis not present

## 2016-05-05 DIAGNOSIS — F319 Bipolar disorder, unspecified: Secondary | ICD-10-CM | POA: Insufficient documentation

## 2016-05-05 DIAGNOSIS — M5136 Other intervertebral disc degeneration, lumbar region: Secondary | ICD-10-CM | POA: Insufficient documentation

## 2016-05-05 DIAGNOSIS — G8929 Other chronic pain: Secondary | ICD-10-CM | POA: Insufficient documentation

## 2016-05-05 DIAGNOSIS — Z8489 Family history of other specified conditions: Secondary | ICD-10-CM

## 2016-05-05 DIAGNOSIS — M961 Postlaminectomy syndrome, not elsewhere classified: Secondary | ICD-10-CM

## 2016-05-05 DIAGNOSIS — M5386 Other specified dorsopathies, lumbar region: Secondary | ICD-10-CM

## 2016-05-05 DIAGNOSIS — Z79899 Other long term (current) drug therapy: Secondary | ICD-10-CM | POA: Insufficient documentation

## 2016-05-05 DIAGNOSIS — M8949 Other hypertrophic osteoarthropathy, multiple sites: Secondary | ICD-10-CM

## 2016-05-05 DIAGNOSIS — Z79891 Long term (current) use of opiate analgesic: Secondary | ICD-10-CM | POA: Diagnosis not present

## 2016-05-05 DIAGNOSIS — Z842 Family history of other diseases of the genitourinary system: Secondary | ICD-10-CM

## 2016-05-05 DIAGNOSIS — M47816 Spondylosis without myelopathy or radiculopathy, lumbar region: Secondary | ICD-10-CM

## 2016-05-05 MED ORDER — OXYCODONE HCL 10 MG PO TABS: 10.0000 mg | ORAL_TABLET | Freq: Four times a day (QID) | ORAL | 0 refills | Status: DC

## 2016-05-05 MED ORDER — NALOXONE HCL 4 MG/0.1ML NA LIQD: NASAL | 2 refills | Status: DC

## 2016-05-05 NOTE — Progress Notes (Signed)
Subjective:    Patient ID: Wendy Johnson, female    DOB: Mar 04, 1966, 50 y.o.   MRN: [id number]    Chief complaint: Chronic low back pain. Chronic bilateral lower extremity pain and diffuse body pain  HPI:Wendy Johnson presents for reevaluation once again seen in early November. She maintains that her low back pain and neck pain have been stable in nature. She's had some mild worsening of her low back pain recently but it is of the same quality and characteristic. She has seen Dr. Rudene Christians address some fluid off of her left knee and she may ultimately require a replacement of the left knee. She continues to have pain of the right tibia and right knee that a been chronic in nature. She is tolerating her medications well and taking oxycodone 10 mg tablets 3-4 times a day. No significant problems were noted based on the narcotic assessment sheet. She's taking her Adderall for ADHD and summa from Dr. Ancil Boozer her PCP as well as Klonopin for when necessary use for anxiety.    BP (!) 147/76 (BP Location: Left Arm, Patient Position: Sitting, Cuff Size: Large)   Pulse 93   Resp 16   Ht _0  (1.626 m)   Wt 245 lb (111.1 kg)   LMP 09/26/2014   SpO2 100%   BMI 42.05 kg/m     Current Outpatient Prescriptions:  .  amoxicillin-clavulanate (AUGMENTIN) 875-125 MG tablet, Take 1 tablet by mouth 2 (two) times daily., Disp: 14 tablet, Rfl: 0 .  amphetamine-dextroamphetamine (ADDERALL) 30 MG tablet, Take 30 mg by mouth 2 (two) times daily., Disp: , Rfl:  .  ARIPiprazole (ABILIFY) 15 MG tablet, Take 15 mg by mouth at bedtime., Disp: , Rfl:  .  busPIRone (BUSPAR) 30 MG tablet, Take 1 tablet by mouth every evening., Disp: , Rfl:  .  carisoprodol (SOMA) 350 MG tablet, Take 1 tablet (350 mg total) by mouth at bedtime., Disp: 30 tablet, Rfl: 2 .  clonazePAM (KLONOPIN) 1 MG tablet, Take 1-2 mg by mouth 3 (three) times daily as needed for anxiety., Disp: , Rfl:  .  dexlansoprazole (DEXILANT) 60 MG capsule, Take 1 capsule (60 mg  total) by mouth daily., Disp: 30 capsule, Rfl: 5 .  Fluticasone-Salmeterol (ADVAIR) 100-50 MCG/DOSE AEPB, Inhale 1 puff into the lungs 2 (two) times daily., Disp: 1 each, Rfl: 0 .  ibuprofen (ADVIL,MOTRIN) 600 MG tablet, Take 1 tablet (600 mg total) by mouth every 8 (eight) hours as needed., Disp: 90 tablet, Rfl: 0 .  Oxycodone HCl 10 MG TABS, Take 1 tablet (10 mg total) by mouth QID., Disp: 120 tablet, Rfl: 0 .  sertraline (ZOLOFT) 50 MG tablet, Take 50 mg by mouth 2 (two) times daily., Disp: , Rfl:  .  tiotropium (SPIRIVA) 18 MCG inhalation capsule, Place 1 capsule (18 mcg total) into inhaler and inhale daily., Disp: 30 capsule, Rfl: 5 .  traZODone (DESYREL) 150 MG tablet, Take 300 mg by mouth at bedtime as needed for sleep., Disp: , Rfl:  .  aspirin EC 325 MG tablet, Take 1 tablet (325 mg total) by mouth daily. (Patient not taking: Reported on 05/05/2016), Disp: 30 tablet, Rfl: 0 .  Dulaglutide (TRULICITY) 1.5 IW/8.0HO SOPN, Inject 1.5 mg into the skin daily. (Patient not taking: Reported on 05/05/2016), Disp: 4 pen, Rfl: 2 .  naloxone (NARCAN) nasal spray 4 mg/0.1 mL, For excess sedation for opioid overdose, Disp: 1 kit, Rfl: 2 .  zaleplon (SONATA) 10 MG capsule, , Disp: , Rfl:  Patient Active Problem List   Diagnosis Date Noted  . Occipital neuralgia 10/12/2015  . Medication overuse headache 10/12/2015  . Instability of prosthetic knee ([place]) 09/22/2015  . Primary osteoarthritis of right hip 05/12/2015  . Sleep apnea 04/07/2015  . Primary osteoarthritis of left hip 12/16/2014  . Metabolic syndrome 79/06/4095  . Migraine without aura and without status migrainosus, not intractable 11/26/2014  . GERD without esophagitis 11/26/2014  . COPD, moderate ([place]) 11/26/2014  . Nocturnal oxygen desaturation 11/26/2014  . Supplemental oxygen dependent 11/26/2014  . Hearing loss 11/26/2014  . Chronic insomnia 11/26/2014  . History of hypertension 11/26/2014  . Dyslipidemia 11/26/2014  . Morbid  obesity ([place]) 11/26/2014  . Diabetes mellitus type 2, diet-controlled ([place]) 11/26/2014  . Chronic constipation 11/26/2014  . Generalized anxiety disorder 11/11/2014  . DDD (degenerative disc disease), lumbar 11/11/2014  . Facet arthritis of lumbar region ([place]) 11/11/2014  . Primary osteoarthritis involving multiple joints 11/11/2014  . H/O hysterectomy for benign disease 10/20/2014  . History of endometriosis 10/16/2014  . Low back derangement syndrome 09/30/2014  . Degenerative joint disease of both lower legs 09/30/2014  . Bipolar disorder ([place]) 09/30/2014     Review of Systems Cardiac: No angina GI: No constipation  Review of systems negative for cardiovascular negative for pulmonary she snores if her neurologic positive for anxiety depression panic attacks and insomnia she also has bipolar disease as CD with a history of reflux negative for GU negative for hematologic negative for endocrine negative for rheumatologic  Social history is positive for being divorced with one child she smokes pack cigarettes per day for doing this for 12 years  Currently disabled since 2006.  Family history is positive for alcoholism diabetes and high blood pressure     Objective:   Physical Exam  Patient is 50 year old white female in no acute distress is alert and oriented 3 and compliant with examination. Her heart is regular rate and rhythm.  Bilateral paraspinous muscle tenderness and she is ambulating with an antalgic gait but her muscle strength appears to be well-preserved she does have some mild swelling of the left knee    Assessment & Plan:  #1. Chronic low back pain with low back syndrome and degenerative disc disease  #2. Diffuse body pain with osteoarthritis  #3. Degenerative joint disease with severe left hip pain and right hip pain And bilateral knee pain  #4. History of bipolar disorder  #5. History of anxiety depression on chronic opioid management  Plan:We will refill  Her medications today .We have reviewed the risks and benefits of opioid management for chronic use and based on the fact that she is on a benzodiazepine I'm going to prescribe naloxone for emergency use as reviewed with her in detail today. At this point she denies any significant side effects to chronic opioid administration. She is to return to clinic in 2 months. A follow-up with her primary care physicians for baseline medical management.   Dr. Vashti Hey

## 2016-05-05 NOTE — Progress Notes (Signed)
Safety precautions to be maintained throughout the outpatient stay will include: orient to surroundings, keep bed in low position, maintain call bell within reach at all times, provide assistance with transfer out of bed and ambulation.  

## 2016-05-10 ENCOUNTER — Ambulatory Visit
Admission: RE | Admit: 2016-05-10 | Discharge: 2016-05-10 | Disposition: A | Discharge status: Home / Self Care | Payer: Medicare Other | Source: Ambulatory Visit | Attending: Family Medicine | Admitting: Family Medicine

## 2016-05-10 DIAGNOSIS — Z1231 Encounter for screening mammogram for malignant neoplasm of breast: Secondary | ICD-10-CM

## 2016-05-11 ENCOUNTER — Telehealth: Payer: Self-pay | Admitting: Family Medicine

## 2016-05-11 NOTE — Telephone Encounter (Signed)
Pt is needing a oxygen mask for her oxygen machine. The one that covers mouth and nose. States she spoke with Dr Ancil Boozer about this before and she had agreed to do this. She is asking that you send the prescription to [facility] in [place]. Please fax to [phone] Attn: Fritz Pickerel

## 2016-05-11 NOTE — Telephone Encounter (Signed)
Signed rx today and gave to [facility]

## 2016-05-12 NOTE — Telephone Encounter (Signed)
Faxed to [facility]

## 2016-05-16 DIAGNOSIS — F3189 Other bipolar disorder: Secondary | ICD-10-CM | POA: Diagnosis not present

## 2016-05-16 DIAGNOSIS — F902 Attention-deficit hyperactivity disorder, combined type: Secondary | ICD-10-CM | POA: Diagnosis not present

## 2016-05-16 DIAGNOSIS — F4310 Post-traumatic stress disorder, unspecified: Secondary | ICD-10-CM | POA: Diagnosis not present

## 2016-05-16 DIAGNOSIS — F901 Attention-deficit hyperactivity disorder, predominantly hyperactive type: Secondary | ICD-10-CM | POA: Diagnosis not present

## 2016-05-16 DIAGNOSIS — F410 Panic disorder [episodic paroxysmal anxiety] without agoraphobia: Secondary | ICD-10-CM | POA: Diagnosis not present

## 2016-05-16 DIAGNOSIS — Z79899 Other long term (current) drug therapy: Secondary | ICD-10-CM | POA: Diagnosis not present

## 2016-05-19 ENCOUNTER — Other Ambulatory Visit: Payer: Self-pay

## 2016-05-19 DIAGNOSIS — M62838 Other muscle spasm: Secondary | ICD-10-CM

## 2016-05-19 NOTE — Telephone Encounter (Signed)
Patient requesting refill on Soma to [facility]. Patient is very low on medication and needs a refill sent electronically. Thanks

## 2016-05-20 NOTE — Telephone Encounter (Signed)
Per Dr. Ancil Boozer ok to call in enough pills of her [name] until her appointment. Called in enough Soma's until patient is seen on 06/06/16 and patient was notified.

## 2016-06-06 ENCOUNTER — Encounter: Payer: Self-pay | Admitting: Family Medicine

## 2016-06-06 ENCOUNTER — Ambulatory Visit (INDEPENDENT_AMBULATORY_CARE_PROVIDER_SITE_OTHER): Payer: Medicare Other | Admitting: Family Medicine

## 2016-06-06 ENCOUNTER — Other Ambulatory Visit: Payer: Self-pay

## 2016-06-06 VITALS — BP 150/82 | HR 117 | Temp 97.7°F | Resp 20 | Ht 64.0 in | Wt 246.4 lb

## 2016-06-06 DIAGNOSIS — L509 Urticaria, unspecified: Secondary | ICD-10-CM | POA: Diagnosis not present

## 2016-06-06 DIAGNOSIS — F4329 Adjustment disorder with other symptoms: Secondary | ICD-10-CM | POA: Diagnosis not present

## 2016-06-06 DIAGNOSIS — M62838 Other muscle spasm: Secondary | ICD-10-CM

## 2016-06-06 MED ORDER — HYDROXYZINE HCL 25 MG PO TABS: 25.0000 mg | ORAL_TABLET | Freq: Three times a day (TID) | ORAL | 0 refills | Status: DC | PRN

## 2016-06-06 MED ORDER — CARISOPRODOL 350 MG PO TABS: 350.0000 mg | ORAL_TABLET | Freq: Every day | ORAL | 0 refills | Status: DC

## 2016-06-06 NOTE — Progress Notes (Signed)
Name: Wendy Johnson   MRN: [id number]    DOB: 12-30-1965   Date:06/06/2016       Progress Note  Subjective  Chief Complaint  Chief Complaint  Patient presents with  . Urticaria    patient stated that it started about 2 days ago. she thinks it is a flare up due to her nerves    HPI  Hives: she has been under a lot of stress recently. She states she was dating the same person for 5 years, but they broke up last week. She had to move out and is now living by herself off [address]. She states she is scared of living by herself. Over the past two days she has been so nervous that she broke up in hives. She is very itchy and is keeping her up at night. She sees a psychiatrist  Chronic pain: she is asking that I prescribe her soma, but explained she needs to get it from the pain clinic   Patient Active Problem List   Diagnosis Date Noted  . Occipital neuralgia 10/12/2015  . Medication overuse headache 10/12/2015  . Instability of prosthetic knee ([place]) 09/22/2015  . Primary osteoarthritis of right hip 05/12/2015  . Sleep apnea 04/07/2015  . Primary osteoarthritis of left hip 12/16/2014  . Metabolic syndrome 75/91/6384  . Migraine without aura and without status migrainosus, not intractable 11/26/2014  . GERD without esophagitis 11/26/2014  . COPD, moderate ([place]) 11/26/2014  . Nocturnal oxygen desaturation 11/26/2014  . Supplemental oxygen dependent 11/26/2014  . Hearing loss 11/26/2014  . Chronic insomnia 11/26/2014  . History of hypertension 11/26/2014  . Dyslipidemia 11/26/2014  . Morbid obesity ([place]) 11/26/2014  . Diabetes mellitus type 2, diet-controlled ([place]) 11/26/2014  . Chronic constipation 11/26/2014  . Generalized anxiety disorder 11/11/2014  . DDD (degenerative disc disease), lumbar 11/11/2014  . Facet arthritis of lumbar region ([place]) 11/11/2014  . Primary osteoarthritis involving multiple joints 11/11/2014  . H/O hysterectomy for benign disease 10/20/2014  . History of  endometriosis 10/16/2014  . Low back derangement syndrome 09/30/2014  . Degenerative joint disease of both lower legs 09/30/2014  . Bipolar disorder ([place]) 09/30/2014    Past Surgical History:  Procedure Laterality Date  . ABDOMINAL HYSTERECTOMY N/A 10/20/2014   Procedure: Total abdominial hysterectomy, bilateral salpingo-oophorectomy;  Surgeon: Brayton Mars, MD;  Location: [facility];  Service: Gynecology;  Laterality: N/A;  . BILATERAL SALPINGOOPHORECTOMY    . bone spurs removed Bilateral   . FOOT SURGERY Bilateral   . JOINT REPLACEMENT Right    Total Knee replacement X 2  . JOINT REPLACEMENT Bilateral    Total Hip Replacement  . knee arthroscopo Right   . LAPAROSCOPY  09/22/2014   Procedure: LAPAROSCOPY OPERATIVE;  Surgeon: Brayton Mars, MD;  Location: [facility];  Service: Gynecology;;  excision and fulgeration of endomertriosis  . LIPOMA EXCISION    . TONSILLECTOMY    . TOTAL HIP ARTHROPLASTY Left 12/16/2014   Procedure: TOTAL HIP ARTHROPLASTY ANTERIOR APPROACH;  Surgeon: Hessie Knows, MD;  Location: [facility];  Service: Orthopedics;  Laterality: Left;  . TOTAL HIP ARTHROPLASTY Right 05/12/2015   Procedure: TOTAL HIP ARTHROPLASTY ANTERIOR APPROACH;  Surgeon: Hessie Knows, MD;  Location: [facility];  Service: Orthopedics;  Laterality: Right;  . TOTAL KNEE REVISION Right 09/22/2015   Procedure: TOTAL KNEE REVISION/ REVISE POLYIETHYLENE;  Surgeon: Hessie Knows, MD;  Location: [facility];  Service: Orthopedics;  Laterality: Right;  . TUBAL LIGATION      Family  History  Problem Relation Age of Onset  . Diabetes Mother   . Heart disease Father   . Heart attack Father   . Diabetes Sister   . Breast cancer Maternal Aunt     <50  . Breast cancer Paternal Aunt     x2.  <50    Social History   Social History  . Marital status: Divorced    Spouse name: N/A  . Number of children: N/A  . Years of education: N/A   Occupational History  . Not on file.   Social History Main  Topics  . Smoking status: Current Every Day Smoker    Packs/day: 1.00    Years: 35.00    Types: Cigarettes    Start date: 11/26/1979  . Smokeless tobacco: Never Used  . Alcohol use No  . Drug use: No     Comment: quit crack cocaine 2004  . Sexual activity: Yes    Partners: Male    Birth control/ protection: None   Other Topics Concern  . Not on file   Social History Narrative  . No narrative on file     Current Outpatient Prescriptions:  .  amphetamine-dextroamphetamine (ADDERALL) 30 MG tablet, Take 30 mg by mouth 2 (two) times daily., Disp: , Rfl:  .  ARIPiprazole (ABILIFY) 15 MG tablet, Take 15 mg by mouth at bedtime., Disp: , Rfl:  .  busPIRone (BUSPAR) 30 MG tablet, Take 1 tablet by mouth every evening., Disp: , Rfl:  .  carisoprodol (SOMA) 350 MG tablet, Take 1 tablet (350 mg total) by mouth at bedtime., Disp: 30 tablet, Rfl: 2 .  clonazePAM (KLONOPIN) 1 MG tablet, Take 1-2 mg by mouth 3 (three) times daily as needed for anxiety., Disp: , Rfl:  .  dexlansoprazole (DEXILANT) 60 MG capsule, Take 1 capsule (60 mg total) by mouth daily., Disp: 30 capsule, Rfl: 5 .  Fluticasone-Salmeterol (ADVAIR) 100-50 MCG/DOSE AEPB, Inhale 1 puff into the lungs 2 (two) times daily., Disp: 1 each, Rfl: 0 .  Oxycodone HCl 10 MG TABS, Take 1 tablet (10 mg total) by mouth QID., Disp: 120 tablet, Rfl: 0 .  sertraline (ZOLOFT) 100 MG tablet, , Disp: , Rfl:  .  tiotropium (SPIRIVA) 18 MCG inhalation capsule, Place 1 capsule (18 mcg total) into inhaler and inhale daily., Disp: 30 capsule, Rfl: 5 .  tiZANidine (ZANAFLEX) 4 MG tablet, , Disp: , Rfl:  .  traZODone (DESYREL) 150 MG tablet, Take 300 mg by mouth at bedtime as needed for sleep., Disp: , Rfl:  .  aspirin EC 325 MG tablet, Take 1 tablet (325 mg total) by mouth daily. (Patient not taking: Reported on 05/05/2016), Disp: 30 tablet, Rfl: 0 .  hydrOXYzine (ATARAX/VISTARIL) 25 MG tablet, Take 1 tablet (25 mg total) by mouth 3 (three) times daily as  needed., Disp: 10 tablet, Rfl: 0 .  ibuprofen (ADVIL,MOTRIN) 600 MG tablet, Take 1 tablet (600 mg total) by mouth every 8 (eight) hours as needed. (Patient not taking: Reported on 06/06/2016), Disp: 90 tablet, Rfl: 0 .  naloxone (NARCAN) nasal spray 4 mg/0.1 mL, For excess sedation for opioid overdose, Disp: 1 kit, Rfl: 2  Allergies  Allergen Reactions  . Codeine Nausea Only  . Imitrex [Sumatriptan] Other (See Comments)    Chest pain     ROS  Constitutional: Negative for fever or weight change.  Respiratory: Negative for cough and shortness of breath.   Cardiovascular: Negative for chest pain or palpitations.  Gastrointestinal: Negative for  abdominal pain, no bowel changes.  Musculoskeletal: Negative for gait problem or joint swelling.  Skin: Negative for rash.  Neurological: Negative for dizziness or headache.  No other specific complaints in a complete review of systems (except as listed in HPI above).  Objective  Vitals:   06/06/16 0933  BP: (!) 150/82  Pulse: (!) 117  Resp: 20  Temp: 97.7 F (36.5 C)  TempSrc: Oral  SpO2: 96%  Weight: 246 lb 6.4 oz (111.8 kg)  Height: '5\' 4"'$  (1.626 m)    Body mass index is 42.29 kg/m.  Physical Exam  Constitutional: Patient appears well-developed and well-nourished. Obese  No distress.  HEENT: head atraumatic, normocephalic, pupils equal and reactive to light, neck supple, throat within normal limits Cardiovascular: Normal rate, regular rhythm and normal heart sounds.  No murmur heard. No BLE edema. Pulmonary/Chest: Effort normal , wheezing bilaterally ( smoking more because of stress). No respiratory distress. Abdominal: Soft.  There is no tenderness. Skin: hives on both arms and legs.  Psychiatric: Patient has a normal mood and affect. behavior is normal. Judgment and thought content normal.  Recent Results (from the past 2160 hour(s))  ToxASSURE Select 13 (MW), Urine     Status: None   Collection Time: 03/14/16  3:11 PM   Result Value Ref Range   ToxAssure Select 13 FINAL     Comment: ==================================================================== TOXASSURE SELECT 13 (MW) ==================================================================== Test                             Result       Flag       Units Drug Present and Declared for Prescription Verification   7-aminoclonazepam              316          EXPECTED   ng/mg creat    7-aminoclonazepam is an expected metabolite of clonazepam. Source    of clonazepam is a scheduled prescription medication.   Oxycodone                      2480         EXPECTED   ng/mg creat   Oxymorphone                    3024         EXPECTED   ng/mg creat   Noroxycodone                   4831         EXPECTED   ng/mg creat   Noroxymorphone                 736          EXPECTED   ng/mg creat    Sources of oxycodone are scheduled prescription medications.    Oxymorphone, noroxycodone, and noroxymorphone are expected    metabolites of oxycodone. Oxymorphone is also available as a    scheduled prescription medication. Dru g Absent but Declared for Prescription Verification   Amphetamine                    Not Detected UNEXPECTED ng/mg creat ==================================================================== Test                      Result    Flag   Units      Ref Range   Creatinine  45               mg/dL      >=20 ==================================================================== Declared Medications:  The flagging and interpretation on this report are based on the  following declared medications.  Unexpected results may arise from  inaccuracies in the declared medications.  **Note: The testing scope of this panel includes these medications:  Amphetamine (Adderall)  Clonazepam (Klonopin)  Oxycodone  **Note: The testing scope of this panel does not include following  reported medications:  Aripiprazole (Abilify)  Aspirin  Carisoprodol (Soma)   Dexlansoprazole (Dexilant)  Dulaglutide  Ibuprofen  Sertraline (Zoloft)  Tiotropium (Spiriva)  Trazodone ================= =================================================== For clinical consultation, please call [phone]. ====================================================================      PHQ2/9: Depression screen [facility] 2/9 05/05/2016 04/29/2016 03/14/2016 03/02/2016 02/03/2016  Decreased Interest 0 0 0 0 0  Down, Depressed, Hopeless 0 0 0 0 0  PHQ - 2 Score 0 0 0 0 0     Fall Risk: Fall Risk  05/05/2016 04/29/2016 03/14/2016 03/02/2016 02/03/2016  Falls in the past year? No No No No No  Number falls in past yr: - - - - -  Injury with Fall? - - - - -  Risk for fall due to : - - - - -  Follow up - - - - -      Assessment & Plan  1. Hives  - hydrOXYzine (ATARAX/VISTARIL) 25 MG tablet; Take 1 tablet (25 mg total) by mouth 3 (three) times daily as needed.  Dispense: 10 tablet; Refill: 0  2. Stress and adjustment reaction  Keep follow up with psychiatrist  3. Muscle spasm  Seeing Dr. Andree Elk, we will contact his office to see if I can fill Soma until Feb 27th , but I will no longer fill it after that  He states okay to fill it until her visit with him

## 2016-06-15 DIAGNOSIS — Z6841 Body Mass Index (BMI) 40.0 and over, adult: Secondary | ICD-10-CM | POA: Diagnosis not present

## 2016-06-15 DIAGNOSIS — M25561 Pain in right knee: Secondary | ICD-10-CM | POA: Diagnosis not present

## 2016-06-15 DIAGNOSIS — M1811 Unilateral primary osteoarthritis of first carpometacarpal joint, right hand: Secondary | ICD-10-CM | POA: Diagnosis not present

## 2016-06-15 DIAGNOSIS — M25541 Pain in joints of right hand: Secondary | ICD-10-CM | POA: Diagnosis not present

## 2016-06-15 DIAGNOSIS — Z96651 Presence of right artificial knee joint: Secondary | ICD-10-CM | POA: Diagnosis not present

## 2016-06-15 DIAGNOSIS — G8929 Other chronic pain: Secondary | ICD-10-CM | POA: Diagnosis not present

## 2016-07-05 ENCOUNTER — Ambulatory Visit: Payer: Medicare Other | Attending: Anesthesiology | Admitting: Anesthesiology

## 2016-07-05 VITALS — BP 165/85 | HR 88 | Temp 97.5°F | Resp 16 | Ht 64.0 in | Wt 240.0 lb

## 2016-07-05 DIAGNOSIS — Z8489 Family history of other specified conditions: Secondary | ICD-10-CM

## 2016-07-05 DIAGNOSIS — Z79891 Long term (current) use of opiate analgesic: Secondary | ICD-10-CM | POA: Diagnosis not present

## 2016-07-05 DIAGNOSIS — G8929 Other chronic pain: Secondary | ICD-10-CM | POA: Insufficient documentation

## 2016-07-05 DIAGNOSIS — M8949 Other hypertrophic osteoarthropathy, multiple sites: Secondary | ICD-10-CM

## 2016-07-05 DIAGNOSIS — M25562 Pain in left knee: Secondary | ICD-10-CM | POA: Insufficient documentation

## 2016-07-05 DIAGNOSIS — G8918 Other acute postprocedural pain: Secondary | ICD-10-CM

## 2016-07-05 DIAGNOSIS — M5136 Other intervertebral disc degeneration, lumbar region: Secondary | ICD-10-CM | POA: Insufficient documentation

## 2016-07-05 DIAGNOSIS — M79605 Pain in left leg: Secondary | ICD-10-CM | POA: Diagnosis not present

## 2016-07-05 DIAGNOSIS — F319 Bipolar disorder, unspecified: Secondary | ICD-10-CM | POA: Diagnosis not present

## 2016-07-05 DIAGNOSIS — M1990 Unspecified osteoarthritis, unspecified site: Secondary | ICD-10-CM | POA: Diagnosis not present

## 2016-07-05 DIAGNOSIS — M25561 Pain in right knee: Secondary | ICD-10-CM | POA: Diagnosis not present

## 2016-07-05 DIAGNOSIS — M150 Primary generalized (osteo)arthritis: Secondary | ICD-10-CM | POA: Diagnosis not present

## 2016-07-05 DIAGNOSIS — M5386 Other specified dorsopathies, lumbar region: Secondary | ICD-10-CM

## 2016-07-05 DIAGNOSIS — M62838 Other muscle spasm: Secondary | ICD-10-CM | POA: Diagnosis not present

## 2016-07-05 DIAGNOSIS — M79604 Pain in right leg: Secondary | ICD-10-CM | POA: Insufficient documentation

## 2016-07-05 DIAGNOSIS — M545 Low back pain, unspecified: Secondary | ICD-10-CM

## 2016-07-05 DIAGNOSIS — M170 Bilateral primary osteoarthritis of knee: Secondary | ICD-10-CM | POA: Diagnosis not present

## 2016-07-05 DIAGNOSIS — F1190 Opioid use, unspecified, uncomplicated: Secondary | ICD-10-CM | POA: Diagnosis not present

## 2016-07-05 DIAGNOSIS — M4696 Unspecified inflammatory spondylopathy, lumbar region: Secondary | ICD-10-CM

## 2016-07-05 DIAGNOSIS — Z7982 Long term (current) use of aspirin: Secondary | ICD-10-CM | POA: Diagnosis not present

## 2016-07-05 DIAGNOSIS — M47816 Spondylosis without myelopathy or radiculopathy, lumbar region: Secondary | ICD-10-CM

## 2016-07-05 DIAGNOSIS — Z79899 Other long term (current) drug therapy: Secondary | ICD-10-CM | POA: Diagnosis not present

## 2016-07-05 DIAGNOSIS — M159 Polyosteoarthritis, unspecified: Secondary | ICD-10-CM

## 2016-07-05 DIAGNOSIS — M160 Bilateral primary osteoarthritis of hip: Secondary | ICD-10-CM | POA: Diagnosis not present

## 2016-07-05 DIAGNOSIS — F17210 Nicotine dependence, cigarettes, uncomplicated: Secondary | ICD-10-CM | POA: Diagnosis not present

## 2016-07-05 DIAGNOSIS — M542 Cervicalgia: Secondary | ICD-10-CM

## 2016-07-05 DIAGNOSIS — F418 Other specified anxiety disorders: Secondary | ICD-10-CM | POA: Insufficient documentation

## 2016-07-05 DIAGNOSIS — Z842 Family history of other diseases of the genitourinary system: Secondary | ICD-10-CM

## 2016-07-05 DIAGNOSIS — M961 Postlaminectomy syndrome, not elsewhere classified: Secondary | ICD-10-CM

## 2016-07-05 MED ORDER — OXYCODONE HCL 10 MG PO TABS: 10.0000 mg | ORAL_TABLET | Freq: Four times a day (QID) | ORAL | 0 refills | Status: DC

## 2016-07-05 MED ORDER — CARISOPRODOL 350 MG PO TABS: 350.0000 mg | ORAL_TABLET | Freq: Every day | ORAL | 1 refills | Status: DC

## 2016-07-05 NOTE — Progress Notes (Signed)
Safety precautions to be maintained throughout the outpatient stay will include: orient to surroundings, keep bed in low position, maintain call bell within reach at all times, provide assistance with transfer out of bed and ambulation.  

## 2016-07-06 NOTE — Progress Notes (Addendum)
Subjective:    Patient ID: Wendy Johnson, female    DOB: July 06, 1965, 51 y.o.   MRN: [id number]    Chief complaint: Chronic low back pain. Chronic bilateral lower extremity pain and diffuse body pain  [id number] for reevaluation today. She was last seen approximately 2 months ago and her situation has remained relatively stable. She has seen Dr. Rudene Christians recently who evaluated her right knee and femur secondary to the persistent right leg pain that she is experiencing. At this point it is considered nonsurgical. Furthermore she has had right hand pain and this was injected at the time of her orthopedic evaluation. She's been taking her medications as prescribed and these continue to keep her pain under reasonable control and based on her narcotic assessment sheet her overall lifestyle improvement remains with the medications as opposed to without. She denies diverting or illicit use and feels she continues to benefit from the medicines. Otherwise she is in her usual state of health today.   BP (!) 165/85   Pulse 88   Temp 97.5 F (36.4 C) (Oral)   Resp 16   Ht 5' 4"  (1.626 m)   Wt 240 lb (108.9 kg)   LMP 09/26/2014   SpO2 97%   BMI 41.20 kg/m     Current Outpatient Prescriptions:  .  amphetamine-dextroamphetamine (ADDERALL) 30 MG tablet, Take 30 mg by mouth 2 (two) times daily., Disp: , Rfl:  .  ARIPiprazole (ABILIFY) 15 MG tablet, Take 15 mg by mouth at bedtime., Disp: , Rfl:  .  busPIRone (BUSPAR) 30 MG tablet, Take 1 tablet by mouth every evening., Disp: , Rfl:  .  carisoprodol (SOMA) 350 MG tablet, Take 1 tablet (350 mg total) by mouth at bedtime., Disp: 30 tablet, Rfl: 1 .  clonazePAM (KLONOPIN) 1 MG tablet, Take 1-2 mg by mouth 3 (three) times daily as needed for anxiety., Disp: , Rfl:  .  dexlansoprazole (DEXILANT) 60 MG capsule, Take 1 capsule (60 mg total) by mouth daily., Disp: 30 capsule, Rfl: 5 .  Fluticasone-Salmeterol (ADVAIR) 100-50 MCG/DOSE AEPB, Inhale 1 puff into the  lungs 2 (two) times daily., Disp: 1 each, Rfl: 0 .  hydrOXYzine (ATARAX/VISTARIL) 25 MG tablet, Take 1 tablet (25 mg total) by mouth 3 (three) times daily as needed., Disp: 10 tablet, Rfl: 0 .  naloxone (NARCAN) nasal spray 4 mg/0.1 mL, For excess sedation for opioid overdose, Disp: 1 kit, Rfl: 2 .  Oxycodone HCl 10 MG TABS, Take 1 tablet (10 mg total) by mouth QID., Disp: 120 tablet, Rfl: 0 .  sertraline (ZOLOFT) 100 MG tablet, 100 mg 2 (two) times daily. , Disp: , Rfl:  .  tiotropium (SPIRIVA) 18 MCG inhalation capsule, Place 1 capsule (18 mcg total) into inhaler and inhale daily., Disp: 30 capsule, Rfl: 5 .  tiZANidine (ZANAFLEX) 4 MG tablet, 4 mg as needed. , Disp: , Rfl:  .  traZODone (DESYREL) 150 MG tablet, Take 300 mg by mouth at bedtime as needed for sleep., Disp: , Rfl:  .  amphetamine-dextroamphetamine (ADDERALL) 30 MG tablet, Take by mouth., Disp: , Rfl:  .  aspirin EC 325 MG tablet, Take 1 tablet (325 mg total) by mouth daily. (Patient not taking: Reported on 05/05/2016), Disp: 30 tablet, Rfl: 0 .  clonazePAM (KLONOPIN) 1 MG tablet, Take by mouth., Disp: , Rfl:  .  ibuprofen (ADVIL,MOTRIN) 600 MG tablet, Take 1 tablet (600 mg total) by mouth every 8 (eight) hours as needed. (Patient not taking: Reported on  07/05/2016), Disp: 90 tablet, Rfl: 0 .  nortriptyline (PAMELOR) 75 MG capsule, Take by mouth., Disp: , Rfl:  .  sertraline (ZOLOFT) 50 MG tablet, Take by mouth., Disp: , Rfl:  .  tiZANidine (ZANAFLEX) 4 MG tablet, Take by mouth., Disp: , Rfl:  .  traZODone (DESYREL) 150 MG tablet, Take by mouth., Disp: , Rfl:    Patient Active Problem List   Diagnosis Date Noted  . Occipital neuralgia 10/12/2015  . Medication overuse headache 10/12/2015  . Instability of prosthetic knee ([place]) 09/22/2015  . Primary osteoarthritis of right hip 05/12/2015  . Sleep apnea 04/07/2015  . Primary osteoarthritis of left hip 12/16/2014  . Metabolic syndrome 60/73/7106  . Migraine without aura and  without status migrainosus, not intractable 11/26/2014  . GERD without esophagitis 11/26/2014  . COPD, moderate ([place]) 11/26/2014  . Nocturnal oxygen desaturation 11/26/2014  . Supplemental oxygen dependent 11/26/2014  . Hearing loss 11/26/2014  . Chronic insomnia 11/26/2014  . History of hypertension 11/26/2014  . Dyslipidemia 11/26/2014  . Morbid obesity ([place]) 11/26/2014  . Diabetes mellitus type 2, diet-controlled ([place]) 11/26/2014  . Chronic constipation 11/26/2014  . Generalized anxiety disorder 11/11/2014  . DDD (degenerative disc disease), lumbar 11/11/2014  . Facet arthritis of lumbar region ([place]) 11/11/2014  . Primary osteoarthritis involving multiple joints 11/11/2014  . H/O hysterectomy for benign disease 10/20/2014  . History of endometriosis 10/16/2014  . Low back derangement syndrome 09/30/2014  . Degenerative joint disease of both lower legs 09/30/2014  . Bipolar disorder ([place]) 09/30/2014     Review of Systems Cardiac: No angina GI: No constipation  Review of systems negative for cardiovascular negative for pulmonary she snores if her neurologic positive for anxiety depression panic attacks and insomnia she also has bipolar disease as CD with a history of reflux negative for GU negative for hematologic negative for endocrine negative for rheumatologic  Social history is positive for being divorced with one child she smokes pack cigarettes per day for doing this for 12 years  Currently disabled since 2006.  Family history is positive for alcoholism diabetes and high blood pressure     Objective:   Physical Exam  Patient is 51 year old white female in no acute distress is alert and oriented 3 and compliant with examination. Her heart is regular rate and rhythm.  She has some bilateral paraspinous muscle tenderness but no overt trigger points. Her strength is at baseline.    Assessment & Plan:  #1. Chronic low back pain with low back syndrome and degenerative  disc disease  #2. Diffuse body pain with Diffuse osteoarthritis  #3. Degenerative joint disease with severe left hip pain and right hip pain And bilateral knee pain  #4. History of bipolar disorder  #5. History of anxiety depression on chronic opioid management  Plan: She continues to suffer from diffuse chronic pain and diffuse osteoarthritis. She will likely need to have a sternoclavicular joint surgery/evaluation with Dr. Roland Rack in the near future via referral from Dr. Rudene Christians.  I would have her continue with her current medication regimen. She also has been on Soma from her primary care physician at 2 tablets per day. This is working well for her and she is failed relief with Flexeril and Robaxin in the past. I will allow her 1 Soma 350 tablet per day. She is to return to clinic in 2 months  We reviewed the [facility] practitioner database for opioid management and it was appropriate  Dr. Vashti Hey

## 2016-07-11 ENCOUNTER — Telehealth: Payer: Self-pay | Admitting: *Deleted

## 2016-07-11 ENCOUNTER — Other Ambulatory Visit: Payer: Self-pay

## 2016-07-11 NOTE — Telephone Encounter (Signed)
After confirming with Dr. Andree Elk- soma 350mg  at bedtime called in with one refill to [facility]. Per [facility]

## 2016-07-11 NOTE — Telephone Encounter (Signed)
Called and left voicemail that I do see where Wendy Johnson was prescribed on 07/05/16 but it does not appear that it was escribed. Ask patient to call to clarify that she did not receive a written Rx and which pharmacy she would like this called.

## 2016-07-11 NOTE — Telephone Encounter (Signed)
Pt said the last time she was here Dr. Andree Elk told her he sent an electronic script to her pharmacy for soma now pharmacy is saying they did not get it

## 2016-07-28 ENCOUNTER — Ambulatory Visit: Payer: Medicare Other | Admitting: Family Medicine

## 2016-07-28 ENCOUNTER — Encounter: Payer: Self-pay | Admitting: Family Medicine

## 2016-07-28 ENCOUNTER — Ambulatory Visit (INDEPENDENT_AMBULATORY_CARE_PROVIDER_SITE_OTHER): Payer: Medicare Other | Admitting: Family Medicine

## 2016-07-28 VITALS — BP 118/78 | HR 113 | Temp 98.7°F | Resp 16 | Ht 64.0 in | Wt 258.4 lb

## 2016-07-28 DIAGNOSIS — R8290 Unspecified abnormal findings in urine: Secondary | ICD-10-CM

## 2016-07-28 DIAGNOSIS — E785 Hyperlipidemia, unspecified: Secondary | ICD-10-CM

## 2016-07-28 DIAGNOSIS — J449 Chronic obstructive pulmonary disease, unspecified: Secondary | ICD-10-CM

## 2016-07-28 DIAGNOSIS — E119 Type 2 diabetes mellitus without complications: Secondary | ICD-10-CM

## 2016-07-28 DIAGNOSIS — K219 Gastro-esophageal reflux disease without esophagitis: Secondary | ICD-10-CM

## 2016-07-28 DIAGNOSIS — R1010 Upper abdominal pain, unspecified: Secondary | ICD-10-CM | POA: Diagnosis not present

## 2016-07-28 LAB — CBC WITH DIFFERENTIAL/PLATELET
BASOS ABS: 0 {cells}/uL (ref 0–200)
Basophils Relative: 0 %
EOS ABS: 244 {cells}/uL (ref 15–500)
Eosinophils Relative: 4 %
HEMATOCRIT: 42.3 % (ref 35.0–45.0)
Hemoglobin: 13.8 g/dL (ref 11.7–15.5)
LYMPHS PCT: 31 %
Lymphs Abs: 1891 cells/uL (ref 850–3900)
MCH: 27.3 pg (ref 27.0–33.0)
MCHC: 32.6 g/dL (ref 32.0–36.0)
MCV: 83.8 fL (ref 80.0–100.0)
MONO ABS: 549 {cells}/uL (ref 200–950)
MONOS PCT: 9 %
MPV: 11 fL (ref 7.5–12.5)
NEUTROS PCT: 56 %
Neutro Abs: 3416 cells/uL (ref 1500–7800)
Platelets: 241 10*3/uL (ref 140–400)
RBC: 5.05 MIL/uL (ref 3.80–5.10)
RDW: 16.6 % — AB (ref 11.0–15.0)
WBC: 6.1 10*3/uL (ref 3.8–10.8)

## 2016-07-28 LAB — COMPLETE METABOLIC PANEL WITH GFR
ALT: 14 U/L (ref 6–29)
AST: 15 U/L (ref 10–35)
Albumin: 4 g/dL (ref 3.6–5.1)
Alkaline Phosphatase: 93 U/L (ref 33–115)
BILIRUBIN TOTAL: 0.3 mg/dL (ref 0.2–1.2)
BUN: 9 mg/dL (ref 7–25)
CHLORIDE: 105 mmol/L (ref 98–110)
CO2: 30 mmol/L (ref 20–31)
CREATININE: 0.66 mg/dL (ref 0.50–1.10)
Calcium: 9.3 mg/dL (ref 8.6–10.2)
GFR, Est Non African American: >89 mL/min (ref >=60)
Glucose, Bld: 77 mg/dL (ref 65–99)
Potassium: 4.6 mmol/L (ref 3.5–5.3)
Sodium: 139 mmol/L (ref 135–146)
Total Protein: 6.9 g/dL (ref 6.1–8.1)

## 2016-07-28 LAB — LIPID PANEL
CHOL/HDL RATIO: 7 ratio — AB (ref <5.0)
CHOLESTEROL: 168 mg/dL (ref <200)
HDL: 24 mg/dL — ABNORMAL LOW (ref >50)
Triglycerides: 466 mg/dL — ABNORMAL HIGH (ref <150)

## 2016-07-28 LAB — POCT URINALYSIS DIPSTICK
Bilirubin, UA: NEGATIVE
Blood, UA: NEGATIVE
Glucose, UA: POSITIVE
Ketones, UA: NEGATIVE
NITRITE UA: NEGATIVE
PH UA: 6 (ref 5.0–8.0)
PROTEIN UA: NEGATIVE
Spec Grav, UA: 1.025 (ref 1.030–1.035)
UROBILINOGEN UA: NEGATIVE (ref ?–2.0)

## 2016-07-28 LAB — POCT UA - MICROALBUMIN: MICROALBUMIN (UR) POC: 20 mg/L

## 2016-07-28 LAB — HEMOGLOBIN A1C
Hgb A1c MFr Bld: 5.3 % (ref <5.7)
Mean Plasma Glucose: 105 mg/dL

## 2016-07-28 MED ORDER — TIOTROPIUM BROMIDE MONOHYDRATE 18 MCG IN CAPS: 18.0000 ug | ORAL_CAPSULE | Freq: Every day | RESPIRATORY_TRACT | 5 refills | Status: DC

## 2016-07-28 MED ORDER — DEXLANSOPRAZOLE 60 MG PO CPDR: 60.0000 mg | DELAYED_RELEASE_CAPSULE | Freq: Every day | ORAL | 5 refills | Status: DC

## 2016-07-28 NOTE — Progress Notes (Signed)
Name: Wendy Johnson   MRN: [id number]    DOB: [birth year]   Date:07/28/2016       Progress Note  Subjective  Chief Complaint  Chief Complaint  Patient presents with  . GI Problem    vomitting and stomach pain pt thinks it may be an ulcer she was diagnosed with when she was younger  . Urinary Tract Infection    has an odor    HPI  Chronic pain/muscles spasms: she goes to the pain clinic, sees Dr. Andree Elk, she is on Soma and also takes Oxycodone,  she would like a refill of Ibuprofen to take at most twice daily prn, explained risk of worsening GI symptoms and or GI bleed.   GERD: on Dexilant but is having peri-umbilical, and associated with nausea and vomiting, pain is described as aching like, at times sharp, intermittent but daily, going on for the past month, getting progressively worse, no change in bowel movements or blood in stools  Obesity: she was doing well, and had lost a lot of weight, she was down to 185 lbs, but has gradually gained weight back and was very frustrated on her last visit in September started on Trulicity, but stopped because of possible side effects. . Still going to the gym, having Slim Fast for breakfast, and eating healthier. She is gaining weight again  DMII: she was diagnosed years ago with DM and improved with weight loss, but is back drinking [facility], she denies polyphagia, polydipsia or polyuria, no blurred vision. She has metabolic syndrome.   Urine odor: going on for the past few weeks, worse when she first wakes up, no fever or chills.   Patient Active Problem List   Diagnosis Date Noted  . Occipital neuralgia 10/12/2015  . Medication overuse headache 10/12/2015  . Instability of prosthetic knee ([place]) 09/22/2015  . Primary osteoarthritis of right hip 05/12/2015  . Sleep apnea 04/07/2015  . Primary osteoarthritis of left hip 12/16/2014  . Metabolic syndrome 85/46/2703  . Migraine without aura and without status migrainosus, not intractable  11/26/2014  . GERD without esophagitis 11/26/2014  . COPD, moderate ([place]) 11/26/2014  . Nocturnal oxygen desaturation 11/26/2014  . Supplemental oxygen dependent 11/26/2014  . Hearing loss 11/26/2014  . Chronic insomnia 11/26/2014  . History of hypertension 11/26/2014  . Dyslipidemia 11/26/2014  . Morbid obesity ([place]) 11/26/2014  . Diabetes mellitus type 2, diet-controlled ([place]) 11/26/2014  . Chronic constipation 11/26/2014  . Generalized anxiety disorder 11/11/2014  . DDD (degenerative disc disease), lumbar 11/11/2014  . Facet arthritis of lumbar region ([place]) 11/11/2014  . Primary osteoarthritis involving multiple joints 11/11/2014  . H/O hysterectomy for benign disease 10/20/2014  . History of endometriosis 10/16/2014  . Low back derangement syndrome 09/30/2014  . Degenerative joint disease of both lower legs 09/30/2014  . Bipolar disorder ([place]) 09/30/2014    Past Surgical History:  Procedure Laterality Date  . ABDOMINAL HYSTERECTOMY N/A 10/20/2014   Procedure: Total abdominial hysterectomy, bilateral salpingo-oophorectomy;  Surgeon: Brayton Mars, MD;  Location: [facility] ORS;  Service: Gynecology;  Laterality: N/A;  . BILATERAL SALPINGOOPHORECTOMY    . bone spurs removed Bilateral   . FOOT SURGERY Bilateral   . JOINT REPLACEMENT Right    Total Knee replacement X 2  . JOINT REPLACEMENT Bilateral    Total Hip Replacement  . knee arthroscopo Right   . LAPAROSCOPY  09/22/2014   Procedure: LAPAROSCOPY OPERATIVE;  Surgeon: Brayton Mars, MD;  Location: [facility] ORS;  Service: Gynecology;;  excision and fulgeration of endomertriosis  . LIPOMA EXCISION    . TONSILLECTOMY    . TOTAL HIP ARTHROPLASTY Left 12/16/2014   Procedure: TOTAL HIP ARTHROPLASTY ANTERIOR APPROACH;  Surgeon: Hessie Knows, MD;  Location: [facility] OR[facility];  Service: Orthopedics;  Laterality: Left;  . TOTAL HIP ARTHROPLASTY Right 05/12/2015   Procedure: TOTAL HIP ARTHROPLASTY ANTERIOR APPROACH;  Surgeon: Hessie Knows,  MD;  Location: [facility] OR[facility];  Service: Orthopedics;  Laterality: Right;  . TOTAL KNEE REVISION Right 09/22/2015   Procedure: TOTAL KNEE REVISION/ REVISE POLYIETHYLENE;  Surgeon: Hessie Knows, MD;  Location: [facility] OR[facility];  Service: Orthopedics;  Laterality: Right;  . TUBAL LIGATION      Family History  Problem Relation Age of Onset  . Diabetes Mother   . Heart disease Father   . Heart attack Father   . Diabetes Sister   . Drug abuse Sister   . Breast cancer Maternal Aunt     <50  . Breast cancer Paternal Aunt     x2.  <50  . Drug abuse Son     Social History   Social History  . Marital status: Divorced    Spouse name: N/A  . Number of children: N/A  . Years of education: N/A   Occupational History  . disabled    Social History Main Topics  . Smoking status: Current Every Day Smoker    Packs/day: 1.00    Years: 35.00    Types: Cigarettes    Start date: 11/26/1979  . Smokeless tobacco: Never Used  . Alcohol use No  . Drug use: No     Comment: quit crack cocaine 2004  . Sexual activity: Yes    Partners: Male    Birth control/ protection: None   Other Topics Concern  . Not on file   Social History Narrative   She is living alone now     Current Outpatient Prescriptions:  .  amphetamine-dextroamphetamine (ADDERALL) 30 MG tablet, Take 30 mg by mouth 2 (two) times daily., Disp: , Rfl:  .  amphetamine-dextroamphetamine (ADDERALL) 30 MG tablet, Take by mouth., Disp: , Rfl:  .  ARIPiprazole (ABILIFY) 15 MG tablet, Take 15 mg by mouth at bedtime., Disp: , Rfl:  .  aspirin EC 325 MG tablet, Take 1 tablet (325 mg total) by mouth daily. (Patient not taking: Reported on 05/05/2016), Disp: 30 tablet, Rfl: 0 .  busPIRone (BUSPAR) 30 MG tablet, Take 1 tablet by mouth every evening., Disp: , Rfl:  .  carisoprodol (SOMA) 350 MG tablet, Take 1 tablet (350 mg total) by mouth at bedtime., Disp: 30 tablet, Rfl: 1 .  clonazePAM (KLONOPIN) 1 MG tablet, Take 1-2 mg by mouth 3 (three) times  daily as needed for anxiety., Disp: , Rfl:  .  dexlansoprazole (DEXILANT) 60 MG capsule, Take 1 capsule (60 mg total) by mouth daily., Disp: 30 capsule, Rfl: 5 .  hydrOXYzine (ATARAX/VISTARIL) 25 MG tablet, Take 1 tablet (25 mg total) by mouth 3 (three) times daily as needed., Disp: 10 tablet, Rfl: 0 .  ibuprofen (ADVIL,MOTRIN) 600 MG tablet, Take 1 tablet (600 mg total) by mouth every 8 (eight) hours as needed. (Patient not taking: Reported on 07/05/2016), Disp: 90 tablet, Rfl: 0 .  naloxone (NARCAN) nasal spray 4 mg/0.1 mL, For excess sedation for opioid overdose, Disp: 1 kit, Rfl: 2 .  nortriptyline (PAMELOR) 75 MG capsule, Take by mouth., Disp: , Rfl:  .  Oxycodone HCl 10 MG TABS, Take 1 tablet (10 mg total) by  mouth QID., Disp: 120 tablet, Rfl: 0 .  sertraline (ZOLOFT) 100 MG tablet, 100 mg 2 (two) times daily. , Disp: , Rfl:  .  tiotropium (SPIRIVA) 18 MCG inhalation capsule, Place 1 capsule (18 mcg total) into inhaler and inhale daily., Disp: 30 capsule, Rfl: 5 .  traZODone (DESYREL) 150 MG tablet, Take 300 mg by mouth at bedtime as needed for sleep., Disp: , Rfl:   Allergies  Allergen Reactions  . Codeine Nausea Only  . Imitrex [Sumatriptan] Other (See Comments)    Chest pain     ROS  Constitutional: Negative for fever, positive  weight change.  Respiratory: Positive for intermittent cough and shortness of breath.   Cardiovascular: Negative for chest pain or palpitations.  Gastrointestinal: Positive  for abdominal pain, no bowel changes.  Musculoskeletal: Negative for gait problem or joint swelling.  Skin: Negative for rash.  Neurological: Negative for dizziness or headache.  No other specific complaints in a complete review of systems (except as listed in HPI above).  Objective  Vitals:   07/28/16 0951  BP: 118/78  Pulse: (!) 113  Resp: 16  Temp: 98.7 F (37.1 C)  SpO2: 96%  Weight: 258 lb 7 oz (117.2 kg)  Height: 5' 4"  (1.626 m)    Body mass index is 44.36  kg/m.  Physical Exam  Constitutional: Patient appears well-developed and well-nourished. Obese  No distress.  HEENT: head atraumatic, normocephalic, pupils equal and reactive to light,  neck supple, throat within normal limits Cardiovascular: Normal rate, regular rhythm and normal heart sounds.  No murmur heard. No BLE edema. Pulmonary/Chest: Effort normal and breath sounds normal. No respiratory distress. Abdominal: Soft.  There is tenderness, epigastric and  peri-umbilical , no guarding or rebound Psychiatric: Patient has a normal mood and affect. behavior is normal. Judgment and thought content normal.  Recent Results (from the past 2160 hour(s))  POCT urinalysis dipstick     Status: Abnormal   Collection Time: 07/28/16 10:03 AM  Result Value Ref Range   Color, UA yellow    Clarity, UA clear    Glucose, UA pos    Bilirubin, UA neg    Ketones, UA neg    Spec Grav, UA 1.025 1.030 - 1.035   Blood, UA neg    pH, UA 6.0 5.0 - 8.0   Protein, UA neg    Urobilinogen, UA negative Negative - 2.0   Nitrite, UA neg    Leukocytes, UA Trace (A) Negative     PHQ2/9: Depression screen [facility] 2/9 07/05/2016 05/05/2016 04/29/2016 03/14/2016 03/02/2016  Decreased Interest 0 0 0 0 0  Down, Depressed, Hopeless 0 0 0 0 0  PHQ - 2 Score 0 0 0 0 0     Fall Risk: Fall Risk  07/05/2016 05/05/2016 04/29/2016 03/14/2016 03/02/2016  Falls in the past year? No No No No No  Number falls in past yr: - - - - -  Injury with Fall? - - - - -  Risk for fall due to : - - - - -  Follow up - - - - -     Assessment & Plan  1. Abnormal urine odor  - POCT urinalysis dipstick - Urine culture  2. Dyslipidemia  - Lipid panel  3. COPD, moderate ([place])  - tiotropium (SPIRIVA) 18 MCG inhalation capsule; Place 1 capsule (18 mcg total) into inhaler and inhale daily.  Dispense: 30 capsule; Refill: 5  4. GERD without esophagitis  - dexlansoprazole (DEXILANT) 60 MG capsule; Take  1 capsule (60 mg total) by  mouth daily.  Dispense: 30 capsule; Refill: 5 - H. pylori breath test  5. Diabetes mellitus type 2, diet-controlled (H[facility])  - Hemoglobin A1c - POCT UA - Microalbumin  6. Morbid obesity, unspecified obesity type [facility])  She needs to stop [facility] again   7. Pain of upper abdomen  - COMPLETE METABOLIC PANEL WITH GFR - CBC with Differential/Platelet - POC Hemoccult Bld/Stl (3-Cd Home Screen); Future - referral GI

## 2016-07-29 DIAGNOSIS — M7581 Other shoulder lesions, right shoulder: Secondary | ICD-10-CM | POA: Insufficient documentation

## 2016-07-29 DIAGNOSIS — M25511 Pain in right shoulder: Secondary | ICD-10-CM | POA: Diagnosis not present

## 2016-07-29 LAB — H. PYLORI BREATH TEST: H. PYLORI BREATH TEST: NOT DETECTED

## 2016-07-29 LAB — URINE CULTURE: Organism ID, Bacteria: NO GROWTH

## 2016-08-01 ENCOUNTER — Other Ambulatory Visit: Payer: Self-pay | Admitting: Family Medicine

## 2016-08-01 ENCOUNTER — Telehealth: Payer: Self-pay

## 2016-08-01 MED ORDER — ICOSAPENT ETHYL 1 G PO CAPS: 2.0000 | ORAL_CAPSULE | Freq: Two times a day (BID) | ORAL | 2 refills | Status: DC

## 2016-08-01 NOTE — Telephone Encounter (Signed)
Patient notified of lab results by phone.  

## 2016-08-23 ENCOUNTER — Ambulatory Visit: Payer: Medicare Other | Attending: Anesthesiology | Admitting: Anesthesiology

## 2016-08-23 ENCOUNTER — Encounter: Payer: Self-pay | Admitting: Anesthesiology

## 2016-08-23 VITALS — BP 145/83 | HR 90 | Temp 98.0°F | Resp 20 | Ht 64.0 in | Wt 255.0 lb

## 2016-08-23 DIAGNOSIS — M25551 Pain in right hip: Secondary | ICD-10-CM | POA: Insufficient documentation

## 2016-08-23 DIAGNOSIS — M545 Low back pain, unspecified: Secondary | ICD-10-CM

## 2016-08-23 DIAGNOSIS — M79605 Pain in left leg: Secondary | ICD-10-CM | POA: Diagnosis not present

## 2016-08-23 DIAGNOSIS — Z79891 Long term (current) use of opiate analgesic: Secondary | ICD-10-CM | POA: Insufficient documentation

## 2016-08-23 DIAGNOSIS — M25552 Pain in left hip: Secondary | ICD-10-CM | POA: Diagnosis not present

## 2016-08-23 DIAGNOSIS — Z79899 Other long term (current) drug therapy: Secondary | ICD-10-CM | POA: Diagnosis not present

## 2016-08-23 DIAGNOSIS — Z842 Family history of other diseases of the genitourinary system: Secondary | ICD-10-CM

## 2016-08-23 DIAGNOSIS — M5136 Other intervertebral disc degeneration, lumbar region: Secondary | ICD-10-CM | POA: Diagnosis not present

## 2016-08-23 DIAGNOSIS — M79604 Pain in right leg: Secondary | ICD-10-CM | POA: Diagnosis not present

## 2016-08-23 DIAGNOSIS — M150 Primary generalized (osteo)arthritis: Secondary | ICD-10-CM

## 2016-08-23 DIAGNOSIS — M47816 Spondylosis without myelopathy or radiculopathy, lumbar region: Secondary | ICD-10-CM

## 2016-08-23 DIAGNOSIS — G8918 Other acute postprocedural pain: Secondary | ICD-10-CM

## 2016-08-23 DIAGNOSIS — F1190 Opioid use, unspecified, uncomplicated: Secondary | ICD-10-CM

## 2016-08-23 DIAGNOSIS — M961 Postlaminectomy syndrome, not elsewhere classified: Secondary | ICD-10-CM

## 2016-08-23 DIAGNOSIS — Z7982 Long term (current) use of aspirin: Secondary | ICD-10-CM | POA: Insufficient documentation

## 2016-08-23 DIAGNOSIS — G8929 Other chronic pain: Secondary | ICD-10-CM | POA: Diagnosis not present

## 2016-08-23 DIAGNOSIS — M170 Bilateral primary osteoarthritis of knee: Secondary | ICD-10-CM | POA: Insufficient documentation

## 2016-08-23 DIAGNOSIS — M542 Cervicalgia: Secondary | ICD-10-CM

## 2016-08-23 DIAGNOSIS — M159 Polyosteoarthritis, unspecified: Secondary | ICD-10-CM

## 2016-08-23 DIAGNOSIS — M8949 Other hypertrophic osteoarthropathy, multiple sites: Secondary | ICD-10-CM

## 2016-08-23 MED ORDER — DICLOFENAC SODIUM 1 % TD GEL: 4.0000 g | Freq: Four times a day (QID) | TRANSDERMAL | 1 refills | Status: DC

## 2016-08-23 MED ORDER — OXYCODONE HCL 10 MG PO TABS: 10.0000 mg | ORAL_TABLET | Freq: Four times a day (QID) | ORAL | 0 refills | Status: DC

## 2016-08-23 NOTE — Patient Instructions (Addendum)
You were given 2 prescriptions for Oxycodone today. A prescription for Voltaren gel was sent to your pharmacy.

## 2016-08-23 NOTE — Progress Notes (Signed)
Nursing Pain Medication Assessment:  Safety precautions to be maintained throughout the outpatient stay will include: orient to surroundings, keep bed in low position, maintain call bell within reach at all times, provide assistance with transfer out of bed and ambulation.  Medication Inspection Compliance: Wendy Johnson did not comply with our request to bring her pills to be counted. She was reminded that bringing the medication bottles, even when empty, is a requirement. Pill/Patch Count: None available to be counted. Bottle Appearance: No container available. Did not bring bottle(s) to appointment. Medication: None brought in. Filled Date: N/A Last Medication intake:  Today   Patient reminded to bring pill bottle to all med refill apointments.

## 2016-08-23 NOTE — Progress Notes (Signed)
Subjective:    Patient ID: Wendy Johnson, female    DOB: 1965-09-15, 51 y.o.   MRN: [id number]    Chief complaint: Chronic low back pain. Chronic bilateral lower extremity pain and diffuse body pain  HPI:[name] presents for reevaluation last seen a few months ago. She continues to complain of diffuse body pain primarily affecting the neck and low back and knees. She has been seen by Dr. Rudene Christians regarding her right total knee. She reports that she still having some problems with the alignment with her knees and some swelling in the right knee. This has precipitated a good part of her pain in addition to the low back and diffuse joint pain and for this she takes her oxycodone 10 mg tablets 4 times a day. Based on her narcotic assessment sheet and our review today these seem to be giving her good lifestyle benefit and improvement in overall function. When questioned how she would function without the medications she reports that she would be incapacitated. Unfortunately she failed to gain significant improvement with anti-inflammatories or conservative therapy alone. She is trying to do her physical therapy exercises. Her overall lower extremity strength and function appear to be at baseline.   BP (!) 145/83   Pulse 90   Temp 98 F (36.7 C) (Oral)   Resp 20   Ht 5' 4"  (1.626 m)   Wt 255 lb (115.7 kg)   LMP 09/26/2014   SpO2 96%   BMI 43.77 kg/m     Current Outpatient Prescriptions:  .  amphetamine-dextroamphetamine (ADDERALL) 30 MG tablet, Take 30 mg by mouth 2 (two) times daily., Disp: , Rfl:  .  ARIPiprazole (ABILIFY) 15 MG tablet, Take 15 mg by mouth at bedtime., Disp: , Rfl:  .  busPIRone (BUSPAR) 30 MG tablet, Take 1 tablet by mouth every evening., Disp: , Rfl:  .  carisoprodol (SOMA) 350 MG tablet, Take 1 tablet (350 mg total) by mouth at bedtime., Disp: 30 tablet, Rfl: 1 .  clonazePAM (KLONOPIN) 1 MG tablet, Take 1-2 mg by mouth 3 (three) times daily as needed for anxiety., Disp: , Rfl:   .  dexlansoprazole (DEXILANT) 60 MG capsule, Take 1 capsule (60 mg total) by mouth daily., Disp: 30 capsule, Rfl: 5 .  Oxycodone HCl 10 MG TABS, Take 1 tablet (10 mg total) by mouth QID., Disp: 120 tablet, Rfl: 0 .  sertraline (ZOLOFT) 100 MG tablet, 100 mg 2 (two) times daily. , Disp: , Rfl:  .  tiotropium (SPIRIVA) 18 MCG inhalation capsule, Place 1 capsule (18 mcg total) into inhaler and inhale daily., Disp: 30 capsule, Rfl: 5 .  traZODone (DESYREL) 150 MG tablet, Take 300 mg by mouth at bedtime as needed for sleep., Disp: , Rfl:  .  amphetamine-dextroamphetamine (ADDERALL) 30 MG tablet, Take by mouth., Disp: , Rfl:  .  aspirin EC 325 MG tablet, Take 1 tablet (325 mg total) by mouth daily. (Patient not taking: Reported on 05/05/2016), Disp: 30 tablet, Rfl: 0 .  diclofenac sodium (VOLTAREN) 1 % GEL, Apply 4 g topically 4 (four) times daily., Disp: 1 Tube, Rfl: 1 .  hydrOXYzine (ATARAX/VISTARIL) 25 MG tablet, Take 1 tablet (25 mg total) by mouth 3 (three) times daily as needed. (Patient not taking: Reported on 08/23/2016), Disp: 10 tablet, Rfl: 0 .  ibuprofen (ADVIL,MOTRIN) 600 MG tablet, Take 1 tablet (600 mg total) by mouth every 8 (eight) hours as needed. (Patient not taking: Reported on 08/23/2016), Disp: 90 tablet, Rfl: 0 .  Icosapent Ethyl (VASCEPA) 1 g CAPS, Take 2 capsules by mouth 2 (two) times daily. (Patient not taking: Reported on 08/23/2016), Disp: 120 capsule, Rfl: 2 .  naloxone (NARCAN) nasal spray 4 mg/0.1 mL, For excess sedation for opioid overdose (Patient not taking: Reported on 08/23/2016), Disp: 1 kit, Rfl: 2 .  nortriptyline (PAMELOR) 75 MG capsule, Take by mouth., Disp: , Rfl:    Patient Active Problem List   Diagnosis Date Noted  . Occipital neuralgia 10/12/2015  . Medication overuse headache 10/12/2015  . Instability of prosthetic knee ([place]) 09/22/2015  . Primary osteoarthritis of right hip 05/12/2015  . Sleep apnea 04/07/2015  . Primary osteoarthritis of left hip  12/16/2014  . Metabolic syndrome 16/11/3708  . Migraine without aura and without status migrainosus, not intractable 11/26/2014  . GERD without esophagitis 11/26/2014  . COPD, moderate ([place]) 11/26/2014  . Nocturnal oxygen desaturation 11/26/2014  . Supplemental oxygen dependent 11/26/2014  . Hearing loss 11/26/2014  . Chronic insomnia 11/26/2014  . History of hypertension 11/26/2014  . Dyslipidemia 11/26/2014  . Morbid obesity ([place]) 11/26/2014  . Diabetes mellitus type 2, diet-controlled ([place]) 11/26/2014  . Chronic constipation 11/26/2014  . Generalized anxiety disorder 11/11/2014  . DDD (degenerative disc disease), lumbar 11/11/2014  . Facet arthritis of lumbar region ([place]) 11/11/2014  . Primary osteoarthritis involving multiple joints 11/11/2014  . H/O hysterectomy for benign disease 10/20/2014  . History of endometriosis 10/16/2014  . Low back derangement syndrome 09/30/2014  . Degenerative joint disease of both lower legs 09/30/2014  . Bipolar disorder ([place]) 09/30/2014     Review of Systems Cardiac: No angina GI: No constipation  Review of systems negative for cardiovascular negative for pulmonary she snores if her neurologic positive for anxiety depression panic attacks and insomnia she also has bipolar disease as CD with a history of reflux negative for GU negative for hematologic negative for endocrine negative for rheumatologic  Social history is positive for being divorced with one child she smokes pack cigarettes per day for doing this for 12 years  Currently disabled since 2006.  Family history is positive for alcoholism diabetes and high blood pressure     Objective:   Physical Exam  Patient is 51 year old white female in no acute distress is alert and oriented 3 and compliant with examination. Her heart is regular rate and rhythm.  She has some bilateral paraspinous muscle tenderness but no overt trigger points. Her strength is at baseline.I do not appreciate  any significant swelling at the right knee joint.    Assessment & Plan:  #1. Chronic low back pain with low back syndrome and degenerative disc disease  #2. Diffuse body pain with Diffuse osteoarthritis  #3. Degenerative joint disease with severe left hip pain and right hip pain And bilateral knee pain  #4. History of bipolar disorder  #5. History of anxiety depression on chronic opioid management  Plan:   1. We will refill her medications for the next 2 months for the oxycodone 10 mg tablets 4 times a day. 2. She will do for her urine tox screen soon 3. Continue follow-up with Dr. Rudene Christians 4. Continue back stretching strengthening exercises as tolerated 5. We will refill a prescription for Voltaren cream to the applied sparingly twice a day as discussed with her. Furthermore she has requested an increase in opioid medication and we will not comply with this. We have reviewed the [facility] practitioner database and it is appropriate.  We reviewed the [facility] practitioner database for  opioid management and it was appropriate  Dr. Vashti Hey

## 2016-08-24 ENCOUNTER — Telehealth: Payer: Self-pay

## 2016-08-24 ENCOUNTER — Ambulatory Visit (INDEPENDENT_AMBULATORY_CARE_PROVIDER_SITE_OTHER): Payer: Medicare Other | Admitting: Gastroenterology

## 2016-08-24 ENCOUNTER — Other Ambulatory Visit: Payer: Self-pay

## 2016-08-24 ENCOUNTER — Encounter: Payer: Self-pay | Admitting: Gastroenterology

## 2016-08-24 ENCOUNTER — Telehealth: Payer: Self-pay | Admitting: Gastroenterology

## 2016-08-24 VITALS — BP 131/88 | HR 98 | Temp 98.2°F | Resp 18 | Ht 64.0 in | Wt 257.0 lb

## 2016-08-24 DIAGNOSIS — R1033 Periumbilical pain: Secondary | ICD-10-CM

## 2016-08-24 DIAGNOSIS — R194 Change in bowel habit: Secondary | ICD-10-CM

## 2016-08-24 NOTE — Telephone Encounter (Signed)
08/24/16 MCR/MCD authorization NOT required for EGD & Colonoscopy

## 2016-08-24 NOTE — Telephone Encounter (Signed)
Advised pt CT scheduled for 4/27 @ [place] @ [place] @ 10am. NPO 4 hours prior. Pick-up contrast at least 2 days prior.

## 2016-08-24 NOTE — Progress Notes (Signed)
Gastroenterology Consultation  Referring Provider:     Steele Sizer, MD Primary Care Physician:  Loistine Chance, MD Primary Gastroenterologist:  Dr. Jonathon Bellows  Reason for Consultation:     Abdominal pain         HPI:   Wendy Johnson is a 51 y.o. y/o female referred for consultation & management  by Dr. Loistine Chance, MD.    She has been referred for GERD, abdominal pain.   Recent H pylori breath test was negative. Hba1c 5.3, Hb 13.8 grams.   Abdominal pain: Onset: about 6 weeks , she says that she is under a lot of stress and causes the abdominal , not improved, on and off, occurs at times 5/7 days, each episode can last from 4 hours to 6 hours.  Site :Center of her abdomen Radiation: localized  Severity : 7-10/10  Nature of pain: sharp in nature  Aggravating factors: stress, unsure if pain makes food intake worse Relieving factors :nothing -goes away on its own  Weight loss: gained some weight  NSAID use: takes only percocets , denies use of NSAID's  PPI use :Takes dexilant daily which helps her reflux which is different  Gall bladder surgery: still intake  Frequency of bowel movements: used to have one every few days - of late its been every day , soft , not watery  Change in bowel movements: yes  Relief with bowel movements: yes  Gas/Bloating/Abdominal distension: yes , when she has the pain she is more bloated  She smokes. Denies use of artificial sugars, does not consume dairy.   No change of weight since 04/2016   Past Medical History:  Diagnosis Date  . ADHD (attention deficit hyperactivity disorder)   . Anxiety   . AR (allergic rhinitis)   . Arthritis   . Benign hypertension   . Chronic back pain   . Chronic constipation   . Chronic insomnia   . COPD (chronic obstructive pulmonary disease) ([place])   . Deaf   . Decreased dorsalis pedis pulse   . Depression   . Dyslipidemia   . Fatty liver   . GERD (gastroesophageal reflux disease)   .  Hepatomegaly   . High cholesterol   . Hot flashes   . Migraine with aura   . OCD (obsessive compulsive disorder)   . Plantar warts   . Severe obesity ([place])   . Shortness of breath dyspnea   . Sleep apnea    C-PAP  . Tobacco use   . Trochanteric bursitis of right hip     Past Surgical History:  Procedure Laterality Date  . ABDOMINAL HYSTERECTOMY N/A 10/20/2014   Procedure: Total abdominial hysterectomy, bilateral salpingo-oophorectomy;  Surgeon: Brayton Mars, MD;  Location: [facility];  Service: Gynecology;  Laterality: N/A;  . BILATERAL SALPINGOOPHORECTOMY    . bone spurs removed Bilateral   . FOOT SURGERY Bilateral   . JOINT REPLACEMENT Right    Total Knee replacement X 2  . JOINT REPLACEMENT Bilateral    Total Hip Replacement  . knee arthroscopo Right   . LAPAROSCOPY  09/22/2014   Procedure: LAPAROSCOPY OPERATIVE;  Surgeon: Brayton Mars, MD;  Location: [facility];  Service: Gynecology;;  excision and fulgeration of endomertriosis  . LIPOMA EXCISION    . TONSILLECTOMY    . TOTAL HIP ARTHROPLASTY Left 12/16/2014   Procedure: TOTAL HIP ARTHROPLASTY ANTERIOR APPROACH;  Surgeon: Hessie Knows, MD;  Location: [facility];  Service: Orthopedics;  Laterality: Left;  .  TOTAL HIP ARTHROPLASTY Right 05/12/2015   Procedure: TOTAL HIP ARTHROPLASTY ANTERIOR APPROACH;  Surgeon: Hessie Knows, MD;  Location: [facility];  Service: Orthopedics;  Laterality: Right;  . TOTAL KNEE REVISION Right 09/22/2015   Procedure: TOTAL KNEE REVISION/ REVISE POLYIETHYLENE;  Surgeon: Hessie Knows, MD;  Location: [facility];  Service: Orthopedics;  Laterality: Right;  . TUBAL LIGATION      Prior to Admission medications   Medication Sig Start Date End Date Taking? Authorizing Provider  amphetamine-dextroamphetamine (ADDERALL) 30 MG tablet Take 30 mg by mouth 2 (two) times daily.    Myer Haff, MD  amphetamine-dextroamphetamine (ADDERALL) 30 MG tablet Take by mouth. 06/04/14   Historical Provider, MD    ARIPiprazole (ABILIFY) 15 MG tablet Take 15 mg by mouth at bedtime.    Myer Haff, MD  aspirin EC 325 MG tablet Take 1 tablet (325 mg total) by mouth daily. Patient not taking: Reported on 05/05/2016 09/23/15   Duanne Guess, PA-C  busPIRone (BUSPAR) 30 MG tablet Take 1 tablet by mouth every evening. 04/05/16   Myer Haff, MD  carisoprodol (SOMA) 350 MG tablet Take 1 tablet (350 mg total) by mouth at bedtime. 07/05/16   Molli Barrows, MD  clonazePAM (KLONOPIN) 1 MG tablet Take 1-2 mg by mouth 3 (three) times daily as needed for anxiety.    Myer Haff, MD  dexlansoprazole (DEXILANT) 60 MG capsule Take 1 capsule (60 mg total) by mouth daily. 07/28/16   Steele Sizer, MD  diclofenac sodium (VOLTAREN) 1 % GEL Apply 4 g topically 4 (four) times daily. 08/23/16   Molli Barrows, MD  hydrOXYzine (ATARAX/VISTARIL) 25 MG tablet Take 1 tablet (25 mg total) by mouth 3 (three) times daily as needed. Patient not taking: Reported on 08/23/2016 06/06/16   Steele Sizer, MD  ibuprofen (ADVIL,MOTRIN) 600 MG tablet Take 1 tablet (600 mg total) by mouth every 8 (eight) hours as needed. Patient not taking: Reported on 08/23/2016 03/02/16   Steele Sizer, MD  Icosapent Ethyl (VASCEPA) 1 g CAPS Take 2 capsules by mouth 2 (two) times daily. Patient not taking: Reported on 08/23/2016 08/01/16   Steele Sizer, MD  naloxone [facility]) nasal spray 4 mg/0.1 mL For excess sedation for opioid overdose Patient not taking: Reported on 08/23/2016 05/05/16   Molli Barrows, MD  nortriptyline (PAMELOR) 75 MG capsule Take by mouth. 12/14/15   Historical Provider, MD  Oxycodone HCl 10 MG TABS Take 1 tablet (10 mg total) by mouth QID. 08/23/16   Molli Barrows, MD  sertraline (ZOLOFT) 100 MG tablet 100 mg 2 (two) times daily.  06/02/16   Historical Provider, MD  tiotropium (SPIRIVA) 18 MCG inhalation capsule Place 1 capsule (18 mcg total) into inhaler and inhale daily. 07/28/16   Steele Sizer, MD  traZODone (DESYREL) 150 MG tablet Take 300 mg by  mouth at bedtime as needed for sleep.    Myer Haff, MD    Family History  Problem Relation Age of Onset  . Diabetes Mother   . Heart disease Father   . Heart attack Father   . Diabetes Sister   . Drug abuse Sister   . Breast cancer Maternal Aunt     <50  . Breast cancer Paternal Aunt     x2.  <50  . Drug abuse Son      Social History  Substance Use Topics  . Smoking status: Current Every Day Smoker    Packs/day: 1.00    Years: 35.00  Types: Cigarettes    Start date: 11/26/1979  . Smokeless tobacco: Never Used  . Alcohol use No    Allergies as of 08/24/2016 - Review Complete 08/23/2016  Allergen Reaction Noted  . Codeine Nausea Only 05/05/2015  . Imitrex [sumatriptan] Other (See Comments) 10/15/2014    Review of Systems:    All systems reviewed and negative except where noted in HPI.   Physical Exam:  LMP 09/26/2014  Patient's last menstrual period was 09/26/2014. Psych:  Alert and cooperative. Normal mood and affect. General:   Alert,  Well-developed, well-nourished, pleasant and cooperative in NAD Head:  Normocephalic and atraumatic. Eyes:  Sclera clear, no icterus.   Conjunctiva pink. Ears:  Normal auditory acuity. Nose:  No deformity, discharge, or lesions. Mouth:  No deformity or lesions,oropharynx pink & moist. Neck:  Supple; no masses or thyromegaly. Lungs:  Respirations even and unlabored.  Clear throughout to auscultation.   No wheezes, crackles, or rhonchi. No acute distress. Heart:  Regular rate and rhythm; no murmurs, clicks, rubs, or gallops. Abdomen:  Normal bowel sounds.  No bruits.  Soft, non-tender and non-distended without masses, hepatosplenomegaly or hernias noted.  No guarding or rebound tenderness.   d. Extremities:  No clubbing or edema.  No cyanosis. Neurologic:  Alert and oriented x3;  grossly normal neurologically. Psych:  Alert and cooperative. Normal mood and affect.  Imaging Studies: No results found.  Assessment and Plan:    Wendy Johnson is a 51 y.o. y/o female has been referred for abdominal pain . She has had multiple abdominal surgeries and she is on percocet . She may have trapping of gas, constipation , small bowel bacterial overgrowth from adhesions which cause gut dysmotility. She has a prior history of colon polyps too   Plan  1. Ct abdomen and pelvis with contrast  2. EGD+colonoscopy  3. Trial of IB guard 4. If no better and above tests are negative will consider treatment for SIBO 5. Suggest to stop smoking as aerophagia can cause bloating .   Follow up in 12 weeks  Dr Jonathon Bellows MD

## 2016-08-24 NOTE — Telephone Encounter (Signed)
Gastroenterology Pre-Procedure Review  Request Date: 4/26 Requesting Physician: Dr. Anna  PATIENT REVIEW QUESTIONS: The patient responded to the following health history questions as indicated:    1. Are you having any GI issues? yes (abd pain) 2. Do you have a personal history of Polyps? doesn't remember 3. Do you have a family history of Colon Cancer or Polyps? no 4. Diabetes Mellitus? no 5. Joint replacements in the past 12 months?yes (knee, hips) 6. Major health problems in the past 3 months?no 7. Any artificial heart valves, MVP, or defibrillator?no    MEDICATIONS & ALLERGIES:    Patient reports the following regarding taking any anticoagulation/antiplatelet therapy:   Plavix, Coumadin, Eliquis, Xarelto, Lovenox, Pradaxa, Brilinta, or Effient? no Aspirin? yes (325mg)  Patient confirms/reports the following medications:  Current Outpatient Prescriptions  Medication Sig Dispense Refill  . amphetamine-dextroamphetamine (ADDERALL) 30 MG tablet Take 30 mg by mouth 2 (two) times daily.    . amphetamine-dextroamphetamine (ADDERALL) 30 MG tablet Take by mouth.    . ARIPiprazole (ABILIFY) 15 MG tablet Take 15 mg by mouth at bedtime.    . aspirin EC 325 MG tablet Take 1 tablet (325 mg total) by mouth daily. (Patient not taking: Reported on 08/24/2016) 30 tablet 0  . busPIRone (BUSPAR) 30 MG tablet Take 1 tablet by mouth every evening.    . carisoprodol (SOMA) 350 MG tablet Take 1 tablet (350 mg total) by mouth at bedtime. 30 tablet 1  . clonazePAM (KLONOPIN) 1 MG tablet Take 1-2 mg by mouth 3 (three) times daily as needed for anxiety.    . dexlansoprazole (DEXILANT) 60 MG capsule Take 1 capsule (60 mg total) by mouth daily. 30 capsule 5  . diclofenac sodium (VOLTAREN) 1 % GEL Apply 4 g topically 4 (four) times daily. 1 Tube 1  . hydrOXYzine (ATARAX/VISTARIL) 25 MG tablet Take 1 tablet (25 mg total) by mouth 3 (three) times daily as needed. (Patient not taking: Reported on 08/23/2016) 10  tablet 0  . ibuprofen (ADVIL,MOTRIN) 600 MG tablet Take 1 tablet (600 mg total) by mouth every 8 (eight) hours as needed. 90 tablet 0  . Icosapent Ethyl (VASCEPA) 1 g CAPS Take 2 capsules by mouth 2 (two) times daily. 120 capsule 2  . naloxone (NARCAN) nasal spray 4 mg/0.1 mL For excess sedation for opioid overdose 1 kit 2  . nortriptyline (PAMELOR) 75 MG capsule Take by mouth.    . Oxycodone HCl 10 MG TABS Take 1 tablet (10 mg total) by mouth QID. 120 tablet 0  . sertraline (ZOLOFT) 100 MG tablet 100 mg 2 (two) times daily.     . tiotropium (SPIRIVA) 18 MCG inhalation capsule Place 1 capsule (18 mcg total) into inhaler and inhale daily. 30 capsule 5  . tiZANidine (ZANAFLEX) 4 MG tablet     . traZODone (DESYREL) 150 MG tablet Take 300 mg by mouth at bedtime as needed for sleep.     No current facility-administered medications for this visit.     Patient confirms/reports the following allergies:  Allergies  Allergen Reactions  . Codeine Nausea Only  . Imitrex [Sumatriptan] Other (See Comments)    Chest pain    No orders of the defined types were placed in this encounter.   AUTHORIZATION INFORMATION Primary Insurance: 1D#: Group #:  Secondary Insurance: 1D#: Group #:  SCHEDULE INFORMATION: Date: 4/26 Time: Location: [facility]  

## 2016-08-29 ENCOUNTER — Encounter: Payer: Self-pay | Admitting: Family Medicine

## 2016-08-29 ENCOUNTER — Ambulatory Visit (INDEPENDENT_AMBULATORY_CARE_PROVIDER_SITE_OTHER): Payer: Medicare Other | Admitting: Family Medicine

## 2016-08-29 VITALS — BP 120/72 | HR 100 | Temp 97.4°F | Resp 16 | Ht 64.0 in | Wt 258.3 lb

## 2016-08-29 DIAGNOSIS — M2550 Pain in unspecified joint: Secondary | ICD-10-CM | POA: Diagnosis not present

## 2016-08-29 DIAGNOSIS — M25561 Pain in right knee: Secondary | ICD-10-CM | POA: Diagnosis not present

## 2016-08-29 DIAGNOSIS — M25361 Other instability, right knee: Secondary | ICD-10-CM | POA: Diagnosis not present

## 2016-08-29 DIAGNOSIS — E785 Hyperlipidemia, unspecified: Secondary | ICD-10-CM

## 2016-08-29 DIAGNOSIS — R1033 Periumbilical pain: Secondary | ICD-10-CM | POA: Diagnosis not present

## 2016-08-29 DIAGNOSIS — E1169 Type 2 diabetes mellitus with other specified complication: Secondary | ICD-10-CM | POA: Diagnosis not present

## 2016-08-29 DIAGNOSIS — R198 Other specified symptoms and signs involving the digestive system and abdomen: Secondary | ICD-10-CM

## 2016-08-29 DIAGNOSIS — Z96651 Presence of right artificial knee joint: Secondary | ICD-10-CM | POA: Diagnosis not present

## 2016-08-29 LAB — POCT GLYCOSYLATED HEMOGLOBIN (HGB A1C): HEMOGLOBIN A1C: 5.5

## 2016-08-29 NOTE — Progress Notes (Signed)
Name: Wendy Johnson   MRN: [id number]    DOB: [birth year]   Date:08/29/2016       Progress Note  Subjective  Chief Complaint  Chief Complaint  Patient presents with  . Medication Refill    dexilant  . Diabetes  . Urticaria    pt stated that benedryl is not helping    HPI  Urticaria: she has hives when stressed, she sees Dr. Kasandra Knudsen, advised to ask him to prescribe something for her, such as hydroxyzine  Abdominal pain and change in bowel movements: going on for the past two months, still taking Dexilant, she has Dr. Vicente Males and will have EGD and colonoscopy in 3 days. She continues to have intermittent pain, can last 30 minutes to 2-3 hours. Not associated with meals or activity. At times she wakes up at night with pain. She also has episodes of loose stools , today she already had 4 episodes, no blood or mucus.   DM: diet controlled, no changes, needs yearly eye exam. Denies polyphagia, polydipsia or polyuria. She had very high triglycerides and she is now on Vascepa and denies side effects. We will recheck labs next visit.   Patient Active Problem List   Diagnosis Date Noted  . Rotator cuff tendinitis, right 07/29/2016  . Occipital neuralgia 10/12/2015  . Medication overuse headache 10/12/2015  . Instability of prosthetic knee ([place]) 09/22/2015  . Primary osteoarthritis of right hip 05/12/2015  . Sleep apnea 04/07/2015  . Primary osteoarthritis of left hip 12/16/2014  . Metabolic syndrome 15/40/0867  . Migraine without aura and without status migrainosus, not intractable 11/26/2014  . GERD without esophagitis 11/26/2014  . COPD, moderate ([place]) 11/26/2014  . Nocturnal oxygen desaturation 11/26/2014  . Supplemental oxygen dependent 11/26/2014  . Hearing loss 11/26/2014  . Chronic insomnia 11/26/2014  . History of hypertension 11/26/2014  . Dyslipidemia 11/26/2014  . Morbid obesity ([place]) 11/26/2014  . Diabetes mellitus type 2, diet-controlled ([place]) 11/26/2014  . Chronic constipation  11/26/2014  . Generalized anxiety disorder 11/11/2014  . DDD (degenerative disc disease), lumbar 11/11/2014  . Facet arthritis of lumbar region ([place]) 11/11/2014  . Primary osteoarthritis involving multiple joints 11/11/2014  . H/O hysterectomy for benign disease 10/20/2014  . History of endometriosis 10/16/2014  . Low back derangement syndrome 09/30/2014  . Degenerative joint disease of both lower legs 09/30/2014  . Bipolar disorder ([place]) 09/30/2014    Past Surgical History:  Procedure Laterality Date  . ABDOMINAL HYSTERECTOMY N/A 10/20/2014   Procedure: Total abdominial hysterectomy, bilateral salpingo-oophorectomy;  Surgeon: Brayton Mars, MD;  Location: [facility];  Service: Gynecology;  Laterality: N/A;  . BILATERAL SALPINGOOPHORECTOMY    . bone spurs removed Bilateral   . FOOT SURGERY Bilateral   . JOINT REPLACEMENT Right    Total Knee replacement X 2  . JOINT REPLACEMENT Bilateral    Total Hip Replacement  . knee arthroscopo Right   . LAPAROSCOPY  09/22/2014   Procedure: LAPAROSCOPY OPERATIVE;  Surgeon: Brayton Mars, MD;  Location: [facility];  Service: Gynecology;;  excision and fulgeration of endomertriosis  . LIPOMA EXCISION    . TONSILLECTOMY    . TOTAL HIP ARTHROPLASTY Left 12/16/2014   Procedure: TOTAL HIP ARTHROPLASTY ANTERIOR APPROACH;  Surgeon: Hessie Knows, MD;  Location: [facility];  Service: Orthopedics;  Laterality: Left;  . TOTAL HIP ARTHROPLASTY Right 05/12/2015   Procedure: TOTAL HIP ARTHROPLASTY ANTERIOR APPROACH;  Surgeon: Hessie Knows, MD;  Location: [facility];  Service: Orthopedics;  Laterality: Right;  .  TOTAL KNEE REVISION Right 09/22/2015   Procedure: TOTAL KNEE REVISION/ REVISE POLYIETHYLENE;  Surgeon: Hessie Knows, MD;  Location: [facility];  Service: Orthopedics;  Laterality: Right;  . TUBAL LIGATION      Family History  Problem Relation Age of Onset  . Diabetes Mother   . Heart disease Father   . Heart attack Father   . Diabetes Sister   .  Drug abuse Sister   . Breast cancer Maternal Aunt     <50  . Breast cancer Paternal Aunt     x2.  <50  . Drug abuse Son     Social History   Social History  . Marital status: Divorced    Spouse name: N/A  . Number of children: N/A  . Years of education: N/A   Occupational History  . disabled    Social History Main Topics  . Smoking status: Current Every Day Smoker    Packs/day: 1.00    Years: 35.00    Types: Cigarettes    Start date: 11/26/1979  . Smokeless tobacco: Never Used  . Alcohol use No  . Drug use: No     Comment: quit crack cocaine 2004  . Sexual activity: Yes    Partners: Male    Birth control/ protection: None   Other Topics Concern  . Not on file   Social History Narrative   She is living alone now     Current Outpatient Prescriptions:  .  amphetamine-dextroamphetamine (ADDERALL) 30 MG tablet, Take 30 mg by mouth 2 (two) times daily., Disp: , Rfl:  .  amphetamine-dextroamphetamine (ADDERALL) 30 MG tablet, Take by mouth., Disp: , Rfl:  .  ARIPiprazole (ABILIFY) 15 MG tablet, Take 15 mg by mouth at bedtime., Disp: , Rfl:  .  aspirin EC 325 MG tablet, Take 1 tablet (325 mg total) by mouth daily. (Patient not taking: Reported on 08/24/2016), Disp: 30 tablet, Rfl: 0 .  busPIRone (BUSPAR) 30 MG tablet, Take 1 tablet by mouth every evening., Disp: , Rfl:  .  carisoprodol (SOMA) 350 MG tablet, Take 1 tablet (350 mg total) by mouth at bedtime., Disp: 30 tablet, Rfl: 1 .  clonazePAM (KLONOPIN) 1 MG tablet, Take 1-2 mg by mouth 3 (three) times daily as needed for anxiety., Disp: , Rfl:  .  dexlansoprazole (DEXILANT) 60 MG capsule, Take 1 capsule (60 mg total) by mouth daily., Disp: 30 capsule, Rfl: 5 .  diclofenac sodium (VOLTAREN) 1 % GEL, Apply 4 g topically 4 (four) times daily., Disp: 1 Tube, Rfl: 1 .  ibuprofen (ADVIL,MOTRIN) 600 MG tablet, Take 1 tablet (600 mg total) by mouth every 8 (eight) hours as needed., Disp: 90 tablet, Rfl: 0 .  Icosapent Ethyl  (VASCEPA) 1 g CAPS, Take 2 capsules by mouth 2 (two) times daily., Disp: 120 capsule, Rfl: 2 .  naloxone (NARCAN) nasal spray 4 mg/0.1 mL, For excess sedation for opioid overdose, Disp: 1 kit, Rfl: 2 .  nortriptyline (PAMELOR) 75 MG capsule, Take by mouth., Disp: , Rfl:  .  Oxycodone HCl 10 MG TABS, Take 1 tablet (10 mg total) by mouth QID., Disp: 120 tablet, Rfl: 0 .  sertraline (ZOLOFT) 100 MG tablet, 100 mg 2 (two) times daily. , Disp: , Rfl:  .  tiotropium (SPIRIVA) 18 MCG inhalation capsule, Place 1 capsule (18 mcg total) into inhaler and inhale daily., Disp: 30 capsule, Rfl: 5 .  tiZANidine (ZANAFLEX) 4 MG tablet, , Disp: , Rfl:  .  traZODone (DESYREL) 150 MG  tablet, Take 300 mg by mouth at bedtime as needed for sleep., Disp: , Rfl:   Allergies  Allergen Reactions  . Codeine Nausea Only  . Imitrex [Sumatriptan] Other (See Comments)    Chest pain     ROS  Ten systems reviewed and is negative except as mentioned in HPI   Objective  Vitals:   08/29/16 1054  BP: 120/72  Pulse: 100  Resp: 16  Temp: 97.4 F (36.3 C)  SpO2: 97%  Weight: 258 lb 5 oz (117.2 kg)  Height: 5' 4"  (1.626 m)    Body mass index is 44.34 kg/m.  Physical Exam   Constitutional: Patient appears well-developed and well-nourished. Obese  No distress.  HEENT: head atraumatic, normocephalic, pupils equal and reactive to light,  neck supple, throat within normal limits Cardiovascular: Normal rate, regular rhythm and normal heart sounds.  No murmur heard. No BLE edema. Pulmonary/Chest: Effort normal and breath sounds normal. No respiratory distress. Abdominal: Soft.  There is tenderness, epigastric and  peri-umbilical , no guarding or rebound Psychiatric: Patient has a normal mood and affect. behavior is normal. Judgment and thought content normal.  Recent Results (from the past 2160 hour(s))  POCT urinalysis dipstick     Status: Abnormal   Collection Time: 07/28/16 10:03 AM  Result Value Ref Range    Color, UA yellow    Clarity, UA clear    Glucose, UA pos    Bilirubin, UA neg    Ketones, UA neg    Spec Grav, UA 1.025 1.030 - 1.035   Blood, UA neg    pH, UA 6.0 5.0 - 8.0   Protein, UA neg    Urobilinogen, UA negative Negative - 2.0   Nitrite, UA neg    Leukocytes, UA Trace (A) Negative  COMPLETE METABOLIC PANEL WITH GFR     Status: None   Collection Time: 07/28/16 11:05 AM  Result Value Ref Range   Sodium 139 135 - 146 mmol/L   Potassium 4.6 3.5 - 5.3 mmol/L   Chloride 105 98 - 110 mmol/L   CO2 30 20 - 31 mmol/L   Glucose, Bld 77 65 - 99 mg/dL   BUN 9 7 - 25 mg/dL   Creat 0.66 0.50 - 1.10 mg/dL   Total Bilirubin 0.3 0.2 - 1.2 mg/dL   Alkaline Phosphatase 93 33 - 115 U/L   AST 15 10 - 35 U/L   ALT 14 6 - 29 U/L   Total Protein 6.9 6.1 - 8.1 g/dL   Albumin 4.0 3.6 - 5.1 g/dL   Calcium 9.3 8.6 - 10.2 mg/dL   GFR, Est African American >89 >=60 mL/min   GFR, Est Non African American >89 >=60 mL/min  CBC with Differential/Platelet     Status: Abnormal   Collection Time: 07/28/16 11:05 AM  Result Value Ref Range   WBC 6.1 3.8 - 10.8 K/uL   RBC 5.05 3.80 - 5.10 MIL/uL   Hemoglobin 13.8 11.7 - 15.5 g/dL   HCT 42.3 35.0 - 45.0 %   MCV 83.8 80.0 - 100.0 fL   MCH 27.3 27.0 - 33.0 pg   MCHC 32.6 32.0 - 36.0 g/dL   RDW 16.6 (H) 11.0 - 15.0 %   Platelets 241 140 - 400 K/uL   MPV 11.0 7.5 - 12.5 fL   Neutro Abs 3,416 1,500 - 7,800 cells/uL   Lymphs Abs 1,891 850 - 3,900 cells/uL   Monocytes Absolute 549 200 - 950 cells/uL   Eosinophils Absolute 244  15 - 500 cells/uL   Basophils Absolute 0 0 - 200 cells/uL   Neutrophils Relative % 56 %   Lymphocytes Relative 31 %   Monocytes Relative 9 %   Eosinophils Relative 4 %   Basophils Relative 0 %   Smear Review Criteria for review not met   Hemoglobin A1c     Status: None   Collection Time: 07/28/16 11:05 AM  Result Value Ref Range   Hgb A1c MFr Bld 5.3 <5.7 %    Comment:   For the purpose of screening for the presence of  diabetes:   <5.7%       Consistent with the absence of diabetes 5.7-6.4 %   Consistent with increased risk for diabetes (prediabetes) >=6.5 %     Consistent with diabetes   This assay result is consistent with a decreased risk of diabetes.   Currently, no consensus exists regarding use of hemoglobin A1c for diagnosis of diabetes in children.   According to American Diabetes Association (ADA) guidelines, hemoglobin A1c <7.0% represents optimal control in non-pregnant diabetic patients. Different metrics may apply to specific patient populations. Standards of Medical Care in Diabetes (ADA).      Mean Plasma Glucose 105 mg/dL  Lipid panel     Status: Abnormal   Collection Time: 07/28/16 11:05 AM  Result Value Ref Range   Cholesterol 168 <200 mg/dL   Triglycerides 466 (H) <150 mg/dL   HDL 24 (L) >50 mg/dL   Total CHOL/HDL Ratio 7.0 (H) <5.0 Ratio   VLDL NOT CALC <30 mg/dL    Comment:   Not calculated due to Triglyceride >400. Suggest ordering Direct LDL (Unit Code: [id number]).    LDL Cholesterol NOT CALC <100 mg/dL    Comment:   Not calculated due to Triglyceride >400. Suggest ordering Direct LDL (Unit Code: [phone]).   H. pylori breath test     Status: None   Collection Time: 07/28/16 11:05 AM  Result Value Ref Range   H. pylori Breath Test NOT DETECTED Not Detected    Comment:   Antimicrobials, proton pump inhibitors, and bismuth preparations are known to suppress H. pylori, and ingestion of these prior to H. pylori diagnostic testing may lead to false negative results. If clinically indicated, the test may be repeated on a new specimen obtained two weeks after discontinuing treatment.     Urine Culture     Status: None   Collection Time: 07/28/16 12:07 PM  Result Value Ref Range   Organism ID, Bacteria NO GROWTH   POCT UA - Microalbumin     Status: Normal   Collection Time: 07/28/16 12:16 PM  Result Value Ref Range   Microalbumin Ur, POC 20 mg/L   Creatinine, POC   mg/dL   Albumin/Creatinine Ratio, Urine, POC    POCT HgB A1C     Status: Normal   Collection Time: 08/29/16 11:17 AM  Result Value Ref Range   Hemoglobin A1C 5.5       PHQ2/9: Depression screen [facility] 2/9 08/23/2016 07/05/2016 05/05/2016 04/29/2016 03/14/2016  Decreased Interest 0 0 0 0 0  Down, Depressed, Hopeless 0 0 0 0 0  PHQ - 2 Score 0 0 0 0 0     Fall Risk: Fall Risk  08/23/2016 07/05/2016 05/05/2016 04/29/2016 03/14/2016  Falls in the past year? No No No No No  Number falls in past yr: - - - - -  Injury with Fall? - - - - -  Risk for fall due to : - - - - -  Follow up - - - - -     Assessment & Plan  1. Periumbilical abdominal pain  Keep follow up with Dr. Vicente Males  2. DM type 2 with diabetic dyslipidemia ([place])  Continue Vascepa   3. Change in bowel movement  Keep follow up with Dr. Vicente Males and will have EGD and colonoscopy this week

## 2016-08-31 ENCOUNTER — Encounter: Payer: Self-pay | Admitting: *Deleted

## 2016-09-01 ENCOUNTER — Ambulatory Visit: Payer: Medicare Other | Admitting: Registered Nurse

## 2016-09-01 ENCOUNTER — Ambulatory Visit
Admission: RE | Admit: 2016-09-01 | Discharge: 2016-09-01 | Disposition: A | Discharge status: Home / Self Care | Payer: Medicare Other | Source: Ambulatory Visit | Attending: Gastroenterology | Admitting: Gastroenterology

## 2016-09-01 ENCOUNTER — Encounter: Payer: Self-pay | Admitting: *Deleted

## 2016-09-01 ENCOUNTER — Encounter
Admission: RE | Disposition: A | Discharge status: Home / Self Care | Payer: Self-pay | Source: Ambulatory Visit | Attending: Gastroenterology

## 2016-09-01 DIAGNOSIS — F17210 Nicotine dependence, cigarettes, uncomplicated: Secondary | ICD-10-CM | POA: Insufficient documentation

## 2016-09-01 DIAGNOSIS — F79 Unspecified intellectual disabilities: Secondary | ICD-10-CM | POA: Insufficient documentation

## 2016-09-01 DIAGNOSIS — Z7982 Long term (current) use of aspirin: Secondary | ICD-10-CM | POA: Insufficient documentation

## 2016-09-01 DIAGNOSIS — E785 Hyperlipidemia, unspecified: Secondary | ICD-10-CM | POA: Insufficient documentation

## 2016-09-01 DIAGNOSIS — Z6841 Body Mass Index (BMI) 40.0 and over, adult: Secondary | ICD-10-CM | POA: Diagnosis not present

## 2016-09-01 DIAGNOSIS — D124 Benign neoplasm of descending colon: Secondary | ICD-10-CM

## 2016-09-01 DIAGNOSIS — F5104 Psychophysiologic insomnia: Secondary | ICD-10-CM | POA: Diagnosis not present

## 2016-09-01 DIAGNOSIS — F419 Anxiety disorder, unspecified: Secondary | ICD-10-CM | POA: Diagnosis not present

## 2016-09-01 DIAGNOSIS — K635 Polyp of colon: Secondary | ICD-10-CM | POA: Insufficient documentation

## 2016-09-01 DIAGNOSIS — E1151 Type 2 diabetes mellitus with diabetic peripheral angiopathy without gangrene: Secondary | ICD-10-CM | POA: Insufficient documentation

## 2016-09-01 DIAGNOSIS — K648 Other hemorrhoids: Secondary | ICD-10-CM | POA: Diagnosis not present

## 2016-09-01 DIAGNOSIS — F418 Other specified anxiety disorders: Secondary | ICD-10-CM | POA: Diagnosis not present

## 2016-09-01 DIAGNOSIS — K2270 Barrett's esophagus without dysplasia: Secondary | ICD-10-CM | POA: Insufficient documentation

## 2016-09-01 DIAGNOSIS — F329 Major depressive disorder, single episode, unspecified: Secondary | ICD-10-CM | POA: Insufficient documentation

## 2016-09-01 DIAGNOSIS — Z96651 Presence of right artificial knee joint: Secondary | ICD-10-CM | POA: Diagnosis not present

## 2016-09-01 DIAGNOSIS — J449 Chronic obstructive pulmonary disease, unspecified: Secondary | ICD-10-CM | POA: Diagnosis not present

## 2016-09-01 DIAGNOSIS — F909 Attention-deficit hyperactivity disorder, unspecified type: Secondary | ICD-10-CM | POA: Diagnosis not present

## 2016-09-01 DIAGNOSIS — D123 Benign neoplasm of transverse colon: Secondary | ICD-10-CM | POA: Diagnosis not present

## 2016-09-01 DIAGNOSIS — R194 Change in bowel habit: Secondary | ICD-10-CM

## 2016-09-01 DIAGNOSIS — E7800 Pure hypercholesterolemia, unspecified: Secondary | ICD-10-CM | POA: Insufficient documentation

## 2016-09-01 DIAGNOSIS — I10 Essential (primary) hypertension: Secondary | ICD-10-CM | POA: Insufficient documentation

## 2016-09-01 DIAGNOSIS — D125 Benign neoplasm of sigmoid colon: Secondary | ICD-10-CM

## 2016-09-01 DIAGNOSIS — R1013 Epigastric pain: Secondary | ICD-10-CM | POA: Insufficient documentation

## 2016-09-01 DIAGNOSIS — Z96643 Presence of artificial hip joint, bilateral: Secondary | ICD-10-CM | POA: Insufficient documentation

## 2016-09-01 DIAGNOSIS — K219 Gastro-esophageal reflux disease without esophagitis: Secondary | ICD-10-CM | POA: Insufficient documentation

## 2016-09-01 DIAGNOSIS — R1033 Periumbilical pain: Secondary | ICD-10-CM

## 2016-09-01 DIAGNOSIS — K228 Other specified diseases of esophagus: Secondary | ICD-10-CM | POA: Diagnosis not present

## 2016-09-01 DIAGNOSIS — Z79899 Other long term (current) drug therapy: Secondary | ICD-10-CM | POA: Diagnosis not present

## 2016-09-01 HISTORY — PX: COLONOSCOPY WITH PROPOFOL: SHX5780

## 2016-09-01 HISTORY — PX: ESOPHAGOGASTRODUODENOSCOPY (EGD) WITH PROPOFOL: SHX5813

## 2016-09-01 LAB — HM COLONOSCOPY

## 2016-09-01 SURGERY — COLONOSCOPY WITH PROPOFOL
Anesthesia: General

## 2016-09-01 MED ORDER — MIDAZOLAM HCL 2 MG/2ML IJ SOLN
INTRAMUSCULAR | Status: AC
  Filled 2016-09-01: qty 2

## 2016-09-01 MED ORDER — PROPOFOL 500 MG/50ML IV EMUL
INTRAVENOUS | Status: AC
  Filled 2016-09-01: qty 50

## 2016-09-01 MED ORDER — GLYCOPYRROLATE 0.2 MG/ML IJ SOLN
INTRAMUSCULAR | Status: DC | PRN
  Administered 2016-09-01: 0.2 mg via INTRAVENOUS

## 2016-09-01 MED ORDER — GLYCOPYRROLATE 0.2 MG/ML IJ SOLN
INTRAMUSCULAR | Status: AC
  Filled 2016-09-01: qty 1

## 2016-09-01 MED ORDER — FENTANYL CITRATE (PF) 100 MCG/2ML IJ SOLN
INTRAMUSCULAR | Status: AC
  Filled 2016-09-01: qty 2

## 2016-09-01 MED ORDER — PROPOFOL 10 MG/ML IV BOLUS
INTRAVENOUS | Status: DC | PRN
  Administered 2016-09-01: 10 mg via INTRAVENOUS
  Administered 2016-09-01: 80 mg via INTRAVENOUS

## 2016-09-01 MED ORDER — LIDOCAINE HCL (CARDIAC) 20 MG/ML IV SOLN
INTRAVENOUS | Status: DC | PRN
  Administered 2016-09-01: 40 mg via INTRAVENOUS

## 2016-09-01 MED ORDER — PROPOFOL 500 MG/50ML IV EMUL
INTRAVENOUS | Status: DC | PRN
  Administered 2016-09-01: 140 ug/kg/min via INTRAVENOUS

## 2016-09-01 MED ORDER — SODIUM CHLORIDE 0.9 % IV SOLN
INTRAVENOUS | Status: DC
  Administered 2016-09-01: 1000 mL via INTRAVENOUS

## 2016-09-01 MED ORDER — FENTANYL CITRATE (PF) 100 MCG/2ML IJ SOLN
INTRAMUSCULAR | Status: DC | PRN
  Administered 2016-09-01: 50 ug via INTRAVENOUS

## 2016-09-01 MED ORDER — MIDAZOLAM HCL 2 MG/2ML IJ SOLN
INTRAMUSCULAR | Status: DC | PRN
  Administered 2016-09-01: 1 mg via INTRAVENOUS

## 2016-09-01 NOTE — Anesthesia Post-op Follow-up Note (Cosign Needed)
Anesthesia QCDR form completed.        

## 2016-09-01 NOTE — Anesthesia Procedure Notes (Signed)
Date/Time: 09/01/2016 8:34 AM Performed by: Hedda Slade Pre-anesthesia Checklist: Patient identified, Emergency Drugs available, Suction available and Patient being monitored Patient Re-evaluated:Patient Re-evaluated prior to inductionOxygen Delivery Method: Nasal cannula

## 2016-09-01 NOTE — Op Note (Signed)
[facility] Patient Name: Wendy Johnson Procedure Date: 09/01/2016 8:31 AM MRN: [id number] Account #: [id number] Date of Birth: 07/10/1965 Admit Type: Outpatient Age: 51 Room: [facility] ENDO ROOM 4 Gender: Female Note Status: Finalized Procedure:            Colonoscopy Indications:          Change in bowel habits Providers:            Jonathon Bellows MD, MD Referring MD:         Bethena Roys. Sowles, MD (Referring MD) Complications:        No immediate complications. Procedure:            Pre-Anesthesia Assessment:                       - ASA Grade Assessment: III - A patient with severe                        systemic disease.                       - Prior to the procedure, a History and Physical was                        performed, and patient medications, allergies and                        sensitivities were reviewed. The patient's tolerance of                        previous anesthesia was reviewed.                       - The risks and benefits of the procedure and the                        sedation options and risks were discussed with the                        patient. All questions were answered and informed                        consent was obtained.                       After obtaining informed consent, the colonoscope was                        passed under direct vision. Throughout the procedure,                        the patient's blood pressure, pulse, and oxygen                        saturations were monitored continuously. The                        Colonoscope was introduced through the anus and                        advanced to the the terminal ileum. The colonoscopy was  performed with ease. The patient tolerated the                        procedure well. The quality of the bowel preparation                        was excellent. Findings:      The perianal and digital rectal examinations were normal.  Two sessile polyps were found in the descending colon and transverse       colon. The polyps were 5 to 7 mm in size. These polyps were removed with       a cold snare. Resection and retrieval were complete.      Multiple sessile polyps were found in the sigmoid colon. The polyps were       3 to 5 mm in size. These polyps were removed with a cold snare.       Resection and retrieval were complete.      A 7 mm polypoid lesion was found at the anus. The lesion was       semi-sessile. No bleeding was present. It was over the dentate line and       resection not attempted      The terminal ileum appeared normal. Biopsies were taken with a cold       forceps for histology.      Normal mucosa was found in the entire colon. Biopsies for histology were       taken with a cold forceps for evaluation of microscopic colitis. Impression:           - Two 5 to 7 mm polyps in the descending colon and in                        the transverse colon, removed with a cold snare.                        Resected and retrieved.                       - Multiple 3 to 5 mm polyps in the sigmoid colon,                        removed with a cold snare. Resected and retrieved.                       - Polypoid lesion at the anus.                       - The examined portion of the ileum was normal.                        Biopsied.                       - Normal mucosa in the entire examined colon. Biopsied. Recommendation:       - Discharge patient to home (with escort).                       - Resume previous diet.                       - Continue present medications.                       -  Await pathology results.                       - Repeat colonoscopy for surveillance based on                        pathology results.                       - Refer to colorectal surgery to excise polyp 6-7 mm                        over dentate line likely benign based on appearance Procedure Code(s):    --- Professional ---                        [phone], Colonoscopy, flexible; with removal of tumor(s),                        polyp(s), or other lesion(s) by snare technique                       45380, 59, Colonoscopy, flexible; with biopsy, single                        or multiple Diagnosis Code(s):    --- Professional ---                       D12.4, Benign neoplasm of descending colon                       D12.3, Benign neoplasm of transverse colon (hepatic                        flexure or splenic flexure)                       D12.5, Benign neoplasm of sigmoid colon                       D49.0, Neoplasm of unspecified behavior of digestive                        system                       R19.4, Change in bowel habit CPT copyright 2016 American Medical Association. All rights reserved. The codes documented in this report are preliminary and upon coder review may  be revised to meet current compliance requirements. Jonathon Bellows, MD Jonathon Bellows MD, MD 09/01/2016 9:20:35 AM This report has been signed electronically. Number of Addenda: 0 Note Initiated On: 09/01/2016 8:31 AM Scope Withdrawal Time: 0 hours 25 minutes 15 seconds  Total Procedure Duration: 0 hours 28 minutes 14 seconds       [facility]

## 2016-09-01 NOTE — Transfer of Care (Signed)
Immediate Anesthesia Transfer of Care Note  Patient: Wendy Johnson  Procedure(s) Performed: Procedure(s): COLONOSCOPY WITH PROPOFOL (N/A) ESOPHAGOGASTRODUODENOSCOPY (EGD) WITH PROPOFOL (N/A)  Patient Location: PACU  Anesthesia Type:General  Level of Consciousness: awake, alert  and oriented  Airway & Oxygen Therapy: Patient Spontanous Breathing and Patient connected to nasal cannula oxygen  Post-op Assessment: Report given to RN and Post -op Vital signs reviewed and stable  Post vital signs: Reviewed and stable  Last Vitals:  Vitals:   09/01/16 0748 09/01/16 0921  BP:  121/74  Pulse: 80 80  Resp: 20 (!) 24  Temp: 36.2 C (!) 35.9 C    Last Pain:  Vitals:   09/01/16 0921  TempSrc: Tympanic         Complications: No apparent anesthesia complications

## 2016-09-01 NOTE — Anesthesia Postprocedure Evaluation (Signed)
Anesthesia Post Note  Patient: Wendy Johnson  Procedure(s) Performed: Procedure(s) (LRB): COLONOSCOPY WITH PROPOFOL (N/A) ESOPHAGOGASTRODUODENOSCOPY (EGD) WITH PROPOFOL (N/A)  Patient location during evaluation: Endoscopy Anesthesia Type: General Level of consciousness: awake and alert Pain management: pain level controlled Vital Signs Assessment: post-procedure vital signs reviewed and stable Respiratory status: spontaneous breathing, nonlabored ventilation, respiratory function stable and patient connected to nasal cannula oxygen Cardiovascular status: blood pressure returned to baseline and stable Postop Assessment: no signs of nausea or vomiting Anesthetic complications: no     Last Vitals:  Vitals:   09/01/16 0940 09/01/16 0950  BP: 114/67 113/70  Pulse: 81 81  Resp: 20 14  Temp:      Last Pain:  Vitals:   09/01/16 0921  TempSrc: Tympanic                 Calise Dunckel S

## 2016-09-01 NOTE — Anesthesia Preprocedure Evaluation (Addendum)
Anesthesia Evaluation  Patient identified by MRN, date of birth, ID band Patient awake    Reviewed: Allergy & Precautions, NPO status , Patient's Chart, lab work & pertinent test results, reviewed documented beta blocker date and time   Airway Mallampati: III  TM Distance: >3 FB     Dental  (+) Chipped   Pulmonary shortness of breath, sleep apnea , COPD, Current Smoker,           Cardiovascular hypertension, Pt. on medications + Peripheral Vascular Disease       Neuro/Psych  Headaches, PSYCHIATRIC DISORDERS Anxiety Depression Bipolar Disorder    GI/Hepatic GERD  ,  Endo/Other  diabetes  Renal/GU      Musculoskeletal  (+) Arthritis ,   Abdominal   Peds  (+) mental retardation Hematology   Anesthesia Other Findings Does not use CPAP.  Reproductive/Obstetrics                            Anesthesia Physical Anesthesia Plan  ASA: III  Anesthesia Plan: General   Post-op Pain Management:    Induction: Intravenous  Airway Management Planned:   Additional Equipment:   Intra-op Plan:   Post-operative Plan:   Informed Consent: I have reviewed the patients History and Physical, chart, labs and discussed the procedure including the risks, benefits and alternatives for the proposed anesthesia with the patient or authorized representative who has indicated his/her understanding and acceptance.     Plan Discussed with: CRNA  Anesthesia Plan Comments:         Anesthesia Quick Evaluation

## 2016-09-01 NOTE — Op Note (Signed)
[facility] Patient Name: Wendy Johnson Procedure Date: 09/01/2016 8:32 AM MRN: [id number] Account #: [id number] Date of Birth: Jun 20, 1965 Admit Type: Outpatient Age: 51 Room: [facility] ENDO ROOM 4 Gender: Female Note Status: Finalized Procedure:            Upper GI endoscopy Indications:          Epigastric abdominal pain Providers:            Jonathon Bellows MD, MD Referring MD:         Bethena Roys. Sowles, MD (Referring MD) Medicines:            Monitored Anesthesia Care Complications:        No immediate complications. Procedure:            Pre-Anesthesia Assessment:                       - Prior to the procedure, a History and Physical was                        performed, and patient medications, allergies and                        sensitivities were reviewed. The patient's tolerance of                        previous anesthesia was reviewed.                       - The risks and benefits of the procedure and the                        sedation options and risks were discussed with the                        patient. All questions were answered and informed                        consent was obtained.                       - ASA Grade Assessment: III - A patient with severe                        systemic disease.                       After obtaining informed consent, the endoscope was                        passed under direct vision. Throughout the procedure,                        the patient's blood pressure, pulse, and oxygen                        saturations were monitored continuously. The Endoscope                        was introduced through the mouth, and advanced to the  third part of duodenum. The upper GI endoscopy was                        accomplished with ease. The patient tolerated the                        procedure well. Findings:      The examined duodenum was normal. Biopsies for histology were taken  with       a cold forceps for evaluation of celiac disease.      The entire examined stomach was normal. Biopsies were taken with a cold       forceps for histology.      The Z-line was irregular and was found 40 cm from the incisors. Biopsies       were taken with a cold forceps for histology. Impression:           - Normal examined duodenum. Biopsied.                       - Normal stomach. Biopsied.                       - Z-line irregular, 40 cm from the incisors. Biopsied. Recommendation:       - Await pathology results.                       - Perform a colonoscopy today. Procedure Code(s):    --- Professional ---                       [phone], Esophagogastroduodenoscopy, flexible, transoral;                        with biopsy, single or multiple Diagnosis Code(s):    --- Professional ---                       K22.8, Other specified diseases of esophagus                       R10.13, Epigastric pain CPT copyright 2016 American Medical Association. All rights reserved. The codes documented in this report are preliminary and upon coder review may  be revised to meet current compliance requirements. Jonathon Bellows, MD Jonathon Bellows MD, MD 09/01/2016 8:46:14 AM This report has been signed electronically. Number of Addenda: 0 Note Initiated On: 09/01/2016 8:32 AM      [facility]

## 2016-09-01 NOTE — H&P (Signed)
Jonathon Bellows MD [address] [place], [place] [address] Phone: [phone] Fax : [phone]  Primary Care Physician:  Loistine Chance, MD Primary Gastroenterologist:  Dr. Jonathon Bellows   Pre-Procedure History & Physical: HPI:  Wendy Johnson is a 51 y.o. female is here for an endoscopy and colonoscopy.   Past Medical History:  Diagnosis Date  . ADHD (attention deficit hyperactivity disorder)   . Anxiety   . AR (allergic rhinitis)   . Arthritis   . Benign hypertension   . Chronic back pain   . Chronic constipation   . Chronic insomnia   . COPD (chronic obstructive pulmonary disease) ([place])   . Deaf   . Decreased dorsalis pedis pulse   . Depression   . Dyslipidemia   . Fatty liver   . GERD (gastroesophageal reflux disease)   . Hepatomegaly   . High cholesterol   . Hot flashes   . Migraine with aura   . OCD (obsessive compulsive disorder)   . Plantar warts   . Severe obesity ([place])   . Shortness of breath dyspnea   . Sleep apnea    C-PAP  . Tobacco use   . Trochanteric bursitis of right hip     Past Surgical History:  Procedure Laterality Date  . ABDOMINAL HYSTERECTOMY N/A 10/20/2014   Procedure: Total abdominial hysterectomy, bilateral salpingo-oophorectomy;  Surgeon: Brayton Mars, MD;  Location: [facility] ORS;  Service: Gynecology;  Laterality: N/A;  . BILATERAL SALPINGOOPHORECTOMY    . bone spurs removed Bilateral   . FOOT SURGERY Bilateral   . JOINT REPLACEMENT Right    Total Knee replacement X 2  . JOINT REPLACEMENT Bilateral    Total Hip Replacement  . knee arthroscopo Right   . LAPAROSCOPY  09/22/2014   Procedure: LAPAROSCOPY OPERATIVE;  Surgeon: Brayton Mars, MD;  Location: [facility] ORS;  Service: Gynecology;;  excision and fulgeration of endomertriosis  . LIPOMA EXCISION    . TONSILLECTOMY    . TOTAL HIP ARTHROPLASTY Left 12/16/2014   Procedure: TOTAL HIP ARTHROPLASTY ANTERIOR APPROACH;  Surgeon: Hessie Knows, MD;  Location: [facility] ORS;   Service: Orthopedics;  Laterality: Left;  . TOTAL HIP ARTHROPLASTY Right 05/12/2015   Procedure: TOTAL HIP ARTHROPLASTY ANTERIOR APPROACH;  Surgeon: Hessie Knows, MD;  Location: [facility] ORS;  Service: Orthopedics;  Laterality: Right;  . TOTAL KNEE REVISION Right 09/22/2015   Procedure: TOTAL KNEE REVISION/ REVISE POLYIETHYLENE;  Surgeon: Hessie Knows, MD;  Location: [facility] ORS;  Service: Orthopedics;  Laterality: Right;  . TUBAL LIGATION      Prior to Admission medications   Medication Sig Start Date End Date Taking? Authorizing Provider  aspirin EC 325 MG tablet Take 1 tablet (325 mg total) by mouth daily. 09/23/15  Yes Duanne Guess, PA-C  ibuprofen (ADVIL,MOTRIN) 600 MG tablet Take 1 tablet (600 mg total) by mouth every 8 (eight) hours as needed. 03/02/16  Yes Steele Sizer, MD  tiotropium (SPIRIVA) 18 MCG inhalation capsule Place 1 capsule (18 mcg total) into inhaler and inhale daily. 07/28/16  Yes Steele Sizer, MD  amphetamine-dextroamphetamine (ADDERALL) 30 MG tablet Take 30 mg by mouth 2 (two) times daily.    Myer Haff, MD  amphetamine-dextroamphetamine (ADDERALL) 30 MG tablet Take by mouth. 06/04/14   Historical Provider, MD  ARIPiprazole (ABILIFY) 15 MG tablet Take 15 mg by mouth at bedtime.    Myer Haff, MD  busPIRone (BUSPAR) 30 MG tablet Take 1 tablet by mouth every evening. 04/05/16   Myer Haff,  MD  carisoprodol (SOMA) 350 MG tablet Take 1 tablet (350 mg total) by mouth at bedtime. 07/05/16   Molli Barrows, MD  clonazePAM (KLONOPIN) 1 MG tablet Take 1-2 mg by mouth 3 (three) times daily as needed for anxiety.    Myer Haff, MD  dexlansoprazole (DEXILANT) 60 MG capsule Take 1 capsule (60 mg total) by mouth daily. 07/28/16   Steele Sizer, MD  diclofenac sodium (VOLTAREN) 1 % GEL Apply 4 g topically 4 (four) times daily. 08/23/16   Molli Barrows, MD  Icosapent Ethyl (VASCEPA) 1 g CAPS Take 2 capsules by mouth 2 (two) times daily. 08/01/16   Steele Sizer, MD  naloxone [facility]) nasal spray 4  mg/0.1 mL For excess sedation for opioid overdose 05/05/16   Molli Barrows, MD  nortriptyline (PAMELOR) 75 MG capsule Take by mouth. 12/14/15   Historical Provider, MD  Oxycodone HCl 10 MG TABS Take 1 tablet (10 mg total) by mouth QID. 08/23/16   Molli Barrows, MD  sertraline (ZOLOFT) 100 MG tablet 100 mg 2 (two) times daily.  06/02/16   Historical Provider, MD  tiZANidine (ZANAFLEX) 4 MG tablet  07/04/16   Historical Provider, MD  traZODone (DESYREL) 150 MG tablet Take 300 mg by mouth at bedtime as needed for sleep.    Myer Haff, MD    Allergies as of 08/24/2016 - Review Complete 08/24/2016  Allergen Reaction Noted  . Codeine Nausea Only 05/05/2015  . Imitrex [sumatriptan] Other (See Comments) 10/15/2014    Family History  Problem Relation Age of Onset  . Diabetes Mother   . Heart disease Father   . Heart attack Father   . Diabetes Sister   . Drug abuse Sister   . Breast cancer Maternal Aunt     <50  . Breast cancer Paternal Aunt     x2.  <50  . Drug abuse Son     Social History   Social History  . Marital status: Divorced    Spouse name: N/A  . Number of children: N/A  . Years of education: N/A   Occupational History  . disabled    Social History Main Topics  . Smoking status: Current Every Day Smoker    Packs/day: 1.00    Years: 35.00    Types: Cigarettes    Start date: 11/26/1979  . Smokeless tobacco: Never Used  . Alcohol use No  . Drug use: No     Comment: quit crack cocaine 2004  . Sexual activity: Yes    Partners: Male    Birth control/ protection: None   Other Topics Concern  . Not on file   Social History Narrative   She is living alone now    Review of Systems: See HPI, otherwise negative ROS  Physical Exam: Pulse 80   Temp 97.1 F (36.2 C) (Tympanic)   Resp 20   LMP 09/26/2014   SpO2 96%  General:   Alert,  pleasant and cooperative in NAD Head:  Normocephalic and atraumatic. Neck:  Supple; no masses or thyromegaly. Lungs:  Clear  throughout to auscultation.    Heart:  Regular rate and rhythm. Abdomen:  Soft, nontender and nondistended. Normal bowel sounds, without guarding, and without rebound.   Neurologic:  Alert and  oriented x4;  grossly normal neurologically.  Impression/Plan: Wendy Johnson is here for an endoscopy and colonoscopy to be performed for evaluation of abdominal pain and surveillance due to prior history of colon polyps   Risks, benefits,  limitations, and alternatives regarding  endoscopy and colonoscopy have been reviewed with the patient.  Questions have been answered.  All parties agreeable.   Jonathon Bellows, MD  09/01/2016, 8:01 AM

## 2016-09-02 ENCOUNTER — Encounter: Payer: Self-pay | Admitting: Gastroenterology

## 2016-09-02 ENCOUNTER — Ambulatory Visit
Admission: RE | Admit: 2016-09-02 | Discharge: 2016-09-02 | Disposition: A | Discharge status: Home / Self Care | Payer: Medicare Other | Source: Ambulatory Visit | Attending: Gastroenterology | Admitting: Gastroenterology

## 2016-09-02 DIAGNOSIS — K439 Ventral hernia without obstruction or gangrene: Secondary | ICD-10-CM | POA: Diagnosis not present

## 2016-09-02 DIAGNOSIS — R194 Change in bowel habit: Secondary | ICD-10-CM | POA: Insufficient documentation

## 2016-09-02 DIAGNOSIS — R1033 Periumbilical pain: Secondary | ICD-10-CM | POA: Diagnosis not present

## 2016-09-02 DIAGNOSIS — K429 Umbilical hernia without obstruction or gangrene: Secondary | ICD-10-CM | POA: Insufficient documentation

## 2016-09-02 LAB — SURGICAL PATHOLOGY

## 2016-09-02 MED ORDER — IOPAMIDOL (ISOVUE-300) INJECTION 61%
100.0000 mL | Freq: Once | INTRAVENOUS | Status: AC | PRN
  Administered 2016-09-02: 100 mL via INTRAVENOUS

## 2016-09-05 ENCOUNTER — Telehealth: Payer: Self-pay

## 2016-09-05 DIAGNOSIS — F410 Panic disorder [episodic paroxysmal anxiety] without agoraphobia: Secondary | ICD-10-CM | POA: Diagnosis not present

## 2016-09-05 DIAGNOSIS — F3189 Other bipolar disorder: Secondary | ICD-10-CM | POA: Diagnosis not present

## 2016-09-05 DIAGNOSIS — F902 Attention-deficit hyperactivity disorder, combined type: Secondary | ICD-10-CM | POA: Diagnosis not present

## 2016-09-05 DIAGNOSIS — F4310 Post-traumatic stress disorder, unspecified: Secondary | ICD-10-CM | POA: Diagnosis not present

## 2016-09-05 DIAGNOSIS — F901 Attention-deficit hyperactivity disorder, predominantly hyperactive type: Secondary | ICD-10-CM | POA: Diagnosis not present

## 2016-09-05 NOTE — Telephone Encounter (Signed)
Dena sent the prior auth last week.

## 2016-09-05 NOTE — Telephone Encounter (Signed)
Wendy Johnson from [facility] prior auth called and pts insurance will not cover narcan please call [phone]. You can speak with anyone about this matter.

## 2016-09-06 DIAGNOSIS — F4310 Post-traumatic stress disorder, unspecified: Secondary | ICD-10-CM | POA: Diagnosis not present

## 2016-09-06 DIAGNOSIS — Z79899 Other long term (current) drug therapy: Secondary | ICD-10-CM | POA: Diagnosis not present

## 2016-09-06 DIAGNOSIS — F901 Attention-deficit hyperactivity disorder, predominantly hyperactive type: Secondary | ICD-10-CM | POA: Diagnosis not present

## 2016-09-06 DIAGNOSIS — F3189 Other bipolar disorder: Secondary | ICD-10-CM | POA: Diagnosis not present

## 2016-09-06 DIAGNOSIS — F902 Attention-deficit hyperactivity disorder, combined type: Secondary | ICD-10-CM | POA: Diagnosis not present

## 2016-09-06 DIAGNOSIS — F410 Panic disorder [episodic paroxysmal anxiety] without agoraphobia: Secondary | ICD-10-CM | POA: Diagnosis not present

## 2016-09-09 ENCOUNTER — Telehealth: Payer: Self-pay

## 2016-09-09 NOTE — Telephone Encounter (Signed)
Advised patient of results per Dr. Vicente Males.   Pt would like to discuss referral situation due to upcoming knee surgery.

## 2016-09-09 NOTE — Telephone Encounter (Signed)
Patient called and left a voicemail Friday asking that Dr. Andree Elk call her Wendy Johnson out to the pharmacy. She is out. Thank you

## 2016-09-12 NOTE — Telephone Encounter (Signed)
Voicemail left with patient that we are no longer prescribing soma and that we can either call flexeril 10 mg or zanaflex 4 mg bid.  Patient asked to call and let us know what she would like to do.

## 2016-09-12 NOTE — Telephone Encounter (Signed)
Left voicemail with patient that we are no longer prescribing Soma in this clinic.  Options that DR Andree Elk gave are Flexeril 10 mg bid or Zanaflex 4 mg bid.  Asked to please let us know if she has taken either of this before and which may work better for her.

## 2016-09-13 ENCOUNTER — Telehealth: Payer: Self-pay | Admitting: Anesthesiology

## 2016-09-13 ENCOUNTER — Encounter: Payer: Self-pay | Admitting: Gastroenterology

## 2016-09-13 ENCOUNTER — Ambulatory Visit (INDEPENDENT_AMBULATORY_CARE_PROVIDER_SITE_OTHER): Payer: Medicare Other | Admitting: Gastroenterology

## 2016-09-13 ENCOUNTER — Other Ambulatory Visit: Payer: Self-pay

## 2016-09-13 ENCOUNTER — Other Ambulatory Visit: Payer: Self-pay | Admitting: *Deleted

## 2016-09-13 VITALS — BP 129/86 | HR 82 | Temp 97.8°F | Ht 64.0 in | Wt 252.6 lb

## 2016-09-13 DIAGNOSIS — K429 Umbilical hernia without obstruction or gangrene: Secondary | ICD-10-CM

## 2016-09-13 DIAGNOSIS — K5900 Constipation, unspecified: Secondary | ICD-10-CM

## 2016-09-13 MED ORDER — CYCLOBENZAPRINE HCL 10 MG PO TABS: 10.0000 mg | ORAL_TABLET | Freq: Two times a day (BID) | ORAL | 0 refills | Status: DC

## 2016-09-13 MED ORDER — POLYETHYLENE GLYCOL 3350 17 GM/SCOOP PO POWD: 1.0000 | Freq: Every day | ORAL | 3 refills | Status: DC

## 2016-09-13 NOTE — Telephone Encounter (Signed)
Patient wants to get Flexaril called in so she can pick up today. Says she spoke with someone and they left her a msg to call back.

## 2016-09-13 NOTE — Progress Notes (Signed)
Primary Care Physician: Steele Sizer, MD  Primary Gastroenterologist:  Dr. Jonathon Bellows   Chief Complaint  Patient presents with  . Follow-up    HPI: Wendy Johnson is a 51 y.o. female is here today to follow up for abdominal pain.   Summary of history :  She was initially seen on 08/24/16 when she was referred for abdominal pain and GERD which was of 6 weeks duration , she was under a lot of stress at that time. Had 5-7 episodes a day each lasting from 4-6 hours. Located center of her abdomen . She has had multiple abdominal surgeries and I felt that the pain was likely from a combination of trapping of gas, constipation as she was on percocets and possible SIBO, adhesions and gut dysmotility.   Interval history 08/2016-09/2016 Small fat containing infraumbilical hernia other wise normal CT scan of the abdomen .  Smoking - not yet stopped smoking .  Colonoscopy -multiple adenomas were resected and she a small polyp close to the dentate line.  EGD was normal.  Abdominal pain is better since her last visit. She has a bowel movement every few days.    Current Outpatient Prescriptions  Medication Sig Dispense Refill  . amphetamine-dextroamphetamine (ADDERALL) 30 MG tablet Take 30 mg by mouth 2 (two) times daily.    Marland Kitchen amphetamine-dextroamphetamine (ADDERALL) 30 MG tablet Take by mouth.    . ARIPiprazole (ABILIFY) 15 MG tablet Take 15 mg by mouth at bedtime.    Marland Kitchen aspirin EC 325 MG tablet Take 1 tablet (325 mg total) by mouth daily. 30 tablet 0  . busPIRone (BUSPAR) 30 MG tablet Take 1 tablet by mouth every evening.    . carisoprodol (SOMA) 350 MG tablet Take 1 tablet (350 mg total) by mouth at bedtime. 30 tablet 1  . clonazePAM (KLONOPIN) 1 MG tablet Take 1-2 mg by mouth 3 (three) times daily as needed for anxiety.    . cyclobenzaprine (FLEXERIL) 10 MG tablet Take 1 tablet (10 mg total) by mouth 2 (two) times daily. 60 tablet 0  . dexlansoprazole (DEXILANT) 60 MG capsule Take 1 capsule  (60 mg total) by mouth daily. 30 capsule 5  . diclofenac sodium (VOLTAREN) 1 % GEL Apply 4 g topically 4 (four) times daily. 1 Tube 1  . hydrOXYzine (ATARAX/VISTARIL) 25 MG tablet     . ibuprofen (ADVIL,MOTRIN) 600 MG tablet Take 1 tablet (600 mg total) by mouth every 8 (eight) hours as needed. 90 tablet 0  . Icosapent Ethyl (VASCEPA) 1 g CAPS Take 2 capsules by mouth 2 (two) times daily. 120 capsule 2  . naloxone (NARCAN) nasal spray 4 mg/0.1 mL For excess sedation for opioid overdose 1 kit 2  . nortriptyline (PAMELOR) 75 MG capsule Take by mouth.    . Oxycodone HCl 10 MG TABS Take 1 tablet (10 mg total) by mouth QID. 120 tablet 0  . sertraline (ZOLOFT) 100 MG tablet 100 mg 2 (two) times daily.     Marland Kitchen tiotropium (SPIRIVA) 18 MCG inhalation capsule Place 1 capsule (18 mcg total) into inhaler and inhale daily. 30 capsule 5  . tiZANidine (ZANAFLEX) 4 MG tablet     . traZODone (DESYREL) 150 MG tablet Take 300 mg by mouth at bedtime as needed for sleep.     No current facility-administered medications for this visit.     Allergies as of 09/13/2016 - Review Complete 09/13/2016  Allergen Reaction Noted  . Codeine Nausea Only 05/05/2015  . Imitrex [sumatriptan]  Other (See Comments) 10/15/2014    ROS:  General: Negative for anorexia, weight loss, fever, chills, fatigue, weakness. ENT: Negative for hoarseness, difficulty swallowing , nasal congestion. CV: Negative for chest pain, angina, palpitations, dyspnea on exertion, peripheral edema.  Respiratory: Negative for dyspnea at rest, dyspnea on exertion, cough, sputum, wheezing.  GI: See history of present illness. GU:  Negative for dysuria, hematuria, urinary incontinence, urinary frequency, nocturnal urination.  Endo: Negative for unusual weight change.    Physical Examination:   LMP 09/26/2014   General: Well-nourished, well-developed in no acute distress.  Eyes: No icterus. Conjunctivae pink. Mouth: Oropharyngeal mucosa moist and pink  , no lesions erythema or exudate. Lungs: Clear to auscultation bilaterally. Non-labored. Heart: Regular rate and rhythm, no murmurs rubs or gallops.  Abdomen:midline verticle scar  Bowel sounds are normal, nontender, nondistended, no hepatosplenomegaly or masses, no abdominal bruits or hernia , no rebound or guarding.   Extremities: No lower extremity edema. No clubbing or deformities. Neuro: Alert and oriented x 3.  Grossly intact. Skin: Warm and dry, no jaundice.   Psych: Alert and cooperative, normal mood and affect.  Imaging Studies: Ct Abdomen Pelvis W Contrast  Result Date: 09/02/2016 CLINICAL DATA:  Periumbilical pain for several months EXAM: CT ABDOMEN AND PELVIS WITH CONTRAST TECHNIQUE: Multidetector CT imaging of the abdomen and pelvis was performed using the standard protocol following bolus administration of intravenous contrast. CONTRAST:  178m ISOVUE-300 IOPAMIDOL (ISOVUE-300) INJECTION 61% COMPARISON:  None. FINDINGS: Lower chest: No acute abnormality. Hepatobiliary: No focal liver abnormality is seen. No gallstones, gallbladder wall thickening, or biliary dilatation. Pancreas: Unremarkable. No pancreatic ductal dilatation or surrounding inflammatory changes. Spleen: Normal in size without focal abnormality. Adrenals/Urinary Tract: Adrenal glands are unremarkable. Kidneys are normal, without renal calculi, focal lesion, or hydronephrosis. Bladder is unremarkable. Stomach/Bowel: Stomach is within normal limits. Appendix appears normal. Mild diverticular changes noted without evidence of diverticulitis. No evidence of bowel wall thickening, distention, or inflammatory changes. Vascular/Lymphatic: Aortic atherosclerosis. No enlarged abdominal or pelvic lymph nodes. Reproductive: Status post hysterectomy. No adnexal masses. Other: A small fat containing infraumbilical hernia is seen. No bowel is noted within. Musculoskeletal: Bilateral hip replacements are noted with considerable scatter  artifact in the pelvis. Degenerative change of the lumbar spine is seen. IMPRESSION: Small fat containing infraumbilical hernia. No other focal abnormality is noted. Electronically Signed   By: MInez CatalinaM.D.   On: 09/02/2016 12:11    Assessment and Plan:   PDAWNYA GRAMSis a 51y.o. y/o female her for follow up for abdominal pain which I feel was due to constipation , adhesions. Still has a bowel movement once in 4 days but feels better after, not on any laxatives so far . Explained CT scan normal except small periumbilical hernia. She has a polyp on the dentate line not excised seen during recent colonoscopy .   Plan   1. Colorectal surgery referral for small polyp not excised close to dentate line to be resected.  2. Miralax daily for constipation  3. Stop smoking  4. Umbilical hernia referral to surgery for repair if warranted. (hernia seen on recent CT abdomen)    Dr KJonathon Bellows MD Follow up in 3-4 months

## 2016-09-13 NOTE — Telephone Encounter (Signed)
Flexeril 10 mg bid qty 60 escribed to [facility]

## 2016-09-14 ENCOUNTER — Encounter: Payer: Self-pay | Admitting: Gastroenterology

## 2016-09-22 ENCOUNTER — Ambulatory Visit (INDEPENDENT_AMBULATORY_CARE_PROVIDER_SITE_OTHER): Payer: Medicare Other | Admitting: Surgery

## 2016-09-22 ENCOUNTER — Encounter: Payer: Self-pay | Admitting: Surgery

## 2016-09-22 VITALS — BP 132/87 | HR 112 | Temp 98.3°F | Wt 256.0 lb

## 2016-09-22 DIAGNOSIS — K432 Incisional hernia without obstruction or gangrene: Secondary | ICD-10-CM

## 2016-09-22 NOTE — Patient Instructions (Signed)
Please try to stop smoking so you could have a successful surgery and recovery.

## 2016-09-22 NOTE — Progress Notes (Signed)
Surgical Consultation  09/22/2016  Wendy Johnson is an 51 y.o. female.   Chief Complaint  Patient presents with  . Other    Periumbilical hernia      HPI: 51 year old female seen in consultation at at the request of Dr. Vicente Males for a ventral hernia. Patient reports that she has been having intermittent abdominal pain located around the umbilicus for several months now and the pain gets worse with coughing and certain movements. Pain is moderate to severe in intensity and does not radiate. She reports that her pain is sharp. She has seen Dr. Vicente Males from gastroenterology who has done an EGD and colonoscopy. Procedures were personal review there were multiple polyps that were removed at the time of colonoscopy and there was also an anal polyp that was unable to be removed. She ray has a colorectal surgery appointment and schedule date for removal and in the very near future. For of her abdominal pain workup a CT scan of the abdomen and pelvis was performed and a half personally reviewed. There is evidence of a ventral hernia and in the infraumbilical area. There is no evidence of incarceration or strangulation. There is no evidence of any other acute intra-abdominal abnormality. There is no loss of domain.  Of note she does have a history of chronic pain and takes Percocet and daily basis. She also has had multiple orthopedic procedures and is scheduled for a right knee surgery in the next couple weeks. She smokes about two thirds of a pack a day. Her previous surgical history includes a right VATS by Dr. Otilio Connors several years ago. She also has had abdominal hysterectomy and diagnostic laparoscopy in the past CBC and CMP are normal hemoglobin 1 a C normal  Past Medical History:  Diagnosis Date  . ADHD (attention deficit hyperactivity disorder)   . Anxiety   . AR (allergic rhinitis)   . Arthritis   . Benign hypertension   . Chronic back pain   . Chronic constipation   . Chronic insomnia   .  COPD (chronic obstructive pulmonary disease) ([place])   . Deaf   . Decreased dorsalis pedis pulse   . Depression   . Dyslipidemia   . Fatty liver   . GERD (gastroesophageal reflux disease)   . Hepatomegaly   . High cholesterol   . Hot flashes   . Migraine with aura   . OCD (obsessive compulsive disorder)   . Plantar warts   . Severe obesity ([place])   . Shortness of breath dyspnea   . Sleep apnea    C-PAP  . Tobacco use   . Trochanteric bursitis of right hip     Past Surgical History:  Procedure Laterality Date  . ABDOMINAL HYSTERECTOMY N/A 10/20/2014   Procedure: Total abdominial hysterectomy, bilateral salpingo-oophorectomy;  Surgeon: Brayton Mars, MD;  Location: [facility];  Service: Gynecology;  Laterality: N/A;  . BILATERAL SALPINGOOPHORECTOMY    . bone spurs removed Bilateral   . COLONOSCOPY WITH PROPOFOL N/A 09/01/2016   Procedure: COLONOSCOPY WITH PROPOFOL;  Surgeon: Jonathon Bellows, MD;  Location: [facility];  Service: Endoscopy;  Laterality: N/A;  . ESOPHAGOGASTRODUODENOSCOPY (EGD) WITH PROPOFOL N/A 09/01/2016   Procedure: ESOPHAGOGASTRODUODENOSCOPY (EGD) WITH PROPOFOL;  Surgeon: Jonathon Bellows, MD;  Location: [facility];  Service: Endoscopy;  Laterality: N/A;  . FOOT SURGERY Bilateral   . JOINT REPLACEMENT Right    Total Knee replacement X 2  . JOINT REPLACEMENT Bilateral    Total Hip Replacement  . knee arthroscopo  Right   . LAPAROSCOPY  09/22/2014   Procedure: LAPAROSCOPY OPERATIVE;  Surgeon: Brayton Mars, MD;  Location: [facility] OR[facility];  Service: Gynecology;;  excision and fulgeration of endomertriosis  . LIPOMA EXCISION    . TONSILLECTOMY    . TOTAL HIP ARTHROPLASTY Left 12/16/2014   Procedure: TOTAL HIP ARTHROPLASTY ANTERIOR APPROACH;  Surgeon: Hessie Knows, MD;  Location: [facility] OR[facility];  Service: Orthopedics;  Laterality: Left;  . TOTAL HIP ARTHROPLASTY Right 05/12/2015   Procedure: TOTAL HIP ARTHROPLASTY ANTERIOR APPROACH;  Surgeon: Hessie Knows, MD;  Location: [facility]  ORS;  Service: Orthopedics;  Laterality: Right;  . TOTAL KNEE REVISION Right 09/22/2015   Procedure: TOTAL KNEE REVISION/ REVISE POLYIETHYLENE;  Surgeon: Hessie Knows, MD;  Location: [facility] OR[facility];  Service: Orthopedics;  Laterality: Right;  . TUBAL LIGATION      Family History  Problem Relation Age of Onset  . Diabetes Mother   . Heart disease Father   . Heart attack Father   . Diabetes Sister   . Drug abuse Sister   . Breast cancer Maternal Aunt        <50  . Breast cancer Paternal Aunt        x2.  <50  . Drug abuse Son     Social History:  reports that she has been smoking Cigarettes.  She started smoking about 36 years ago. She has a 37.00 pack-year smoking history. She has never used smokeless tobacco. She reports that she does not drink alcohol or use drugs.  Allergies:  Allergies  Allergen Reactions  . Codeine Nausea Only  . Imitrex [Sumatriptan] Other (See Comments)    Chest pain    Medications reviewed.     ROS Full ROS performed and is otherwise negative other than what is stated in the HPI    BP 132/87   Pulse (!) 112   Temp 98.3 F (36.8 C) (Oral)   Wt 116.1 kg (256 lb)   LMP 09/26/2014 Comment: Total  BMI 43.94 kg/m    Physical Exam  Constitutional: She is oriented to person, place, and time and well-developed, well-nourished, and in no distress. No distress.  Obese female  Neck: Normal range of motion. Neck supple. No JVD present. No tracheal deviation present. No thyromegaly present.  Cardiovascular: Normal rate, regular rhythm and normal heart sounds.   Pulmonary/Chest: Effort normal and breath sounds normal. No respiratory distress. She has no wheezes. She has no rales.  Abdominal: Soft. She exhibits no distension. There is no rebound and no guarding.  There is tenderness to palpation infraumbilical region where her previous laparotomy incision is. THere is a hernia, reducible, difficult to measure the exact size given the pt body habitus and the  discomfort.  Musculoskeletal: Normal range of motion. She exhibits no edema.  Neurological: She is alert and oriented to person, place, and time. Gait normal. GCS score is 15.  Skin: Skin is warm and dry. She is not diaphoretic.  Psychiatric: Mood, memory, affect and judgment normal.  Nursing note and vitals reviewed.   Assessment/Plan: Symptomatic incisional hernia. Discussed with the patient in detail about the findings of fracture symptomatic incisional hernia. Given that she is obese and smoker and she has had 2 upcoming surgeries I will like to wait at least until she quit smoking and she commits to losing some weight. I do think that it would be reasonable to do an open repair with mesh. I had extensive discussion with her regarding the expectations and the outcomes. I do  think that she needs to be the best possible shape before an attempted elective hernia repair is performed. I have encouraged her to also losing weight and an stop smoking. She is committed to this and she will see has back in 2 months after her 2 other previous schedule surgeries have been completed.  Caroleen Hamman, MD [facility] General Surgeon

## 2016-10-04 ENCOUNTER — Encounter
Admission: RE | Admit: 2016-10-04 | Discharge: 2016-10-04 | Disposition: A | Discharge status: Home / Self Care | Payer: Medicare Other | Source: Ambulatory Visit | Attending: Orthopedic Surgery | Admitting: Orthopedic Surgery

## 2016-10-04 DIAGNOSIS — E119 Type 2 diabetes mellitus without complications: Secondary | ICD-10-CM | POA: Insufficient documentation

## 2016-10-04 DIAGNOSIS — Z01818 Encounter for other preprocedural examination: Secondary | ICD-10-CM | POA: Insufficient documentation

## 2016-10-04 DIAGNOSIS — I10 Essential (primary) hypertension: Secondary | ICD-10-CM | POA: Insufficient documentation

## 2016-10-04 DIAGNOSIS — E785 Hyperlipidemia, unspecified: Secondary | ICD-10-CM | POA: Diagnosis not present

## 2016-10-04 DIAGNOSIS — M25361 Other instability, right knee: Secondary | ICD-10-CM | POA: Diagnosis not present

## 2016-10-04 DIAGNOSIS — Z79899 Other long term (current) drug therapy: Secondary | ICD-10-CM | POA: Diagnosis not present

## 2016-10-04 LAB — BASIC METABOLIC PANEL
ANION GAP: 6 (ref 5–15)
BUN: 12 mg/dL (ref 6–20)
CALCIUM: 9.2 mg/dL (ref 8.9–10.3)
CO2: 27 mmol/L (ref 22–32)
CREATININE: 0.58 mg/dL (ref 0.44–1.00)
Chloride: 106 mmol/L (ref 101–111)
GFR calc non Af Amer: >60 mL/min (ref >60)
Glucose, Bld: 109 mg/dL — ABNORMAL HIGH (ref 65–99)
Potassium: 3.5 mmol/L (ref 3.5–5.1)
SODIUM: 139 mmol/L (ref 135–145)

## 2016-10-04 LAB — URINALYSIS, ROUTINE W REFLEX MICROSCOPIC
BILIRUBIN URINE: NEGATIVE
GLUCOSE, UA: NEGATIVE mg/dL
HGB URINE DIPSTICK: NEGATIVE
Ketones, ur: NEGATIVE mg/dL
LEUKOCYTES UA: NEGATIVE
Nitrite: NEGATIVE
PH: 5 (ref 5.0–8.0)
PROTEIN: NEGATIVE mg/dL
Specific Gravity, Urine: 1.01 (ref 1.005–1.030)

## 2016-10-04 LAB — CBC
HCT: 40.8 % (ref 35.0–47.0)
HEMOGLOBIN: 13.7 g/dL (ref 12.0–16.0)
MCH: 27.6 pg (ref 26.0–34.0)
MCHC: 33.4 g/dL (ref 32.0–36.0)
MCV: 82.5 fL (ref 80.0–100.0)
Platelets: 204 10*3/uL (ref 150–440)
RBC: 4.95 MIL/uL (ref 3.80–5.20)
RDW: 16.7 % — AB (ref 11.5–14.5)
WBC: 5.9 10*3/uL (ref 3.6–11.0)

## 2016-10-04 LAB — TYPE AND SCREEN
ABO/RH(D): A POS
Antibody Screen: NEGATIVE

## 2016-10-04 LAB — APTT: APTT: 27 s (ref 24–36)

## 2016-10-04 LAB — SURGICAL PCR SCREEN
MRSA, PCR: NEGATIVE
Staphylococcus aureus: NEGATIVE

## 2016-10-04 LAB — PROTIME-INR
INR: 0.97
PROTHROMBIN TIME: 12.9 s (ref 11.4–15.2)

## 2016-10-04 LAB — SEDIMENTATION RATE: Sed Rate: 11 mm/hr (ref 0–20)

## 2016-10-04 NOTE — Patient Instructions (Signed)
Your procedure is scheduled on: Tuesday 10/11/16 Report to [place]. 2ND FLOOR MEDICAL MALL ENTRANCE. To find out your arrival time please call [phone] between 1PM - 3PM on Monday 10/10/16.  Remember: Instructions that are not followed completely may result in serious medical risk, up to and including death, or upon the discretion of your surgeon and anesthesiologist your surgery may need to be rescheduled.    __X__ 1. Do not eat food or drink liquids after midnight. No gum chewing or hard candies.     __X__ 2. No Alcohol for 24 hours before or after surgery.   ____ 3. Bring all medications with you on the day of surgery if instructed.    __X__ 4. Notify your doctor if there is any change in your medical condition     (cold, fever, infections).             ___X__5. No smoking within 24 hours of your surgery.     Do not wear jewelry, make-up, hairpins, clips or nail polish.  Do not wear lotions, powders, or perfumes.   Do not shave 48 hours prior to surgery. Men may shave face and neck.  Do not bring valuables to the hospital.    [facility] is not responsible for any belongings or valuables.               Contacts, dentures or bridgework may not be worn into surgery.  Leave your suitcase in the car. After surgery it may be brought to your room.  For patients admitted to the hospital, discharge time is determined by your                treatment team.   Patients discharged the day of surgery will not be allowed to drive home.   Please read over the following fact sheets that you were given:   MRSA Information   __X__ Take these medicines the morning of surgery with A SIP OF WATER:    1. DEXILANT  2. SERTRALINE  3. KLONOPIN IF NEEDED  4. OXYCODONE IF NEEDED  5.  6.  ____ Fleet Enema (as directed)   __X__ Use CHG Soap as directed  __X__ Use inhalers on the day of surgery  ____ Stop metformin 2 days prior to surgery    ____ Take 1/2 of usual insulin dose the night  before surgery and none on the morning of surgery.   ____ Stop Coumadin/Plavix/aspirin on   __X__ Stop Anti-inflammatories such as Advil, Aleve, Ibuprofen, Motrin, Naproxen, Naprosyn, Goodies,powder, or aspirin products.  OK to take Tylenol.   ____ Stop supplements until after surgery.    ____ Bring C-Pap to the hospital.

## 2016-10-05 ENCOUNTER — Encounter: Payer: Medicare Other | Admitting: Anesthesiology

## 2016-10-05 LAB — URINE CULTURE

## 2016-10-09 ENCOUNTER — Emergency Department
Admission: EM | Admit: 2016-10-09 | Discharge: 2016-10-09 | Disposition: A | Discharge status: Home / Self Care | Payer: Medicare Other | Source: Home / Self Care | Attending: Student in an Organized Health Care Education/Training Program | Admitting: Student in an Organized Health Care Education/Training Program

## 2016-10-09 ENCOUNTER — Encounter: Payer: Self-pay | Admitting: Emergency Medicine

## 2016-10-09 DIAGNOSIS — E7800 Pure hypercholesterolemia, unspecified: Secondary | ICD-10-CM | POA: Diagnosis not present

## 2016-10-09 DIAGNOSIS — M25561 Pain in right knee: Secondary | ICD-10-CM | POA: Diagnosis not present

## 2016-10-09 DIAGNOSIS — F17210 Nicotine dependence, cigarettes, uncomplicated: Secondary | ICD-10-CM | POA: Insufficient documentation

## 2016-10-09 DIAGNOSIS — Y999 Unspecified external cause status: Secondary | ICD-10-CM | POA: Insufficient documentation

## 2016-10-09 DIAGNOSIS — Z6841 Body Mass Index (BMI) 40.0 and over, adult: Secondary | ICD-10-CM | POA: Diagnosis not present

## 2016-10-09 DIAGNOSIS — X58XXXA Exposure to other specified factors, initial encounter: Secondary | ICD-10-CM | POA: Insufficient documentation

## 2016-10-09 DIAGNOSIS — E785 Hyperlipidemia, unspecified: Secondary | ICD-10-CM | POA: Diagnosis not present

## 2016-10-09 DIAGNOSIS — Y929 Unspecified place or not applicable: Secondary | ICD-10-CM | POA: Insufficient documentation

## 2016-10-09 DIAGNOSIS — Z96651 Presence of right artificial knee joint: Secondary | ICD-10-CM

## 2016-10-09 DIAGNOSIS — T8484XA Pain due to internal orthopedic prosthetic devices, implants and grafts, initial encounter: Secondary | ICD-10-CM | POA: Diagnosis not present

## 2016-10-09 DIAGNOSIS — Y9389 Activity, other specified: Secondary | ICD-10-CM

## 2016-10-09 DIAGNOSIS — F909 Attention-deficit hyperactivity disorder, unspecified type: Secondary | ICD-10-CM | POA: Diagnosis not present

## 2016-10-09 DIAGNOSIS — J449 Chronic obstructive pulmonary disease, unspecified: Secondary | ICD-10-CM | POA: Insufficient documentation

## 2016-10-09 DIAGNOSIS — M79645 Pain in left finger(s): Secondary | ICD-10-CM

## 2016-10-09 DIAGNOSIS — Z96643 Presence of artificial hip joint, bilateral: Secondary | ICD-10-CM

## 2016-10-09 DIAGNOSIS — E119 Type 2 diabetes mellitus without complications: Secondary | ICD-10-CM

## 2016-10-09 DIAGNOSIS — F429 Obsessive-compulsive disorder, unspecified: Secondary | ICD-10-CM | POA: Diagnosis not present

## 2016-10-09 DIAGNOSIS — Z79899 Other long term (current) drug therapy: Secondary | ICD-10-CM

## 2016-10-09 MED ORDER — BACITRACIN ZINC 500 UNIT/GM EX OINT
TOPICAL_OINTMENT | Freq: Once | CUTANEOUS | Status: AC
  Administered 2016-10-09: 21:00:00 via TOPICAL
  Filled 2016-10-09: qty 0.9

## 2016-10-09 MED ORDER — OXYCODONE-ACETAMINOPHEN 5-325 MG PO TABS
2.0000 | ORAL_TABLET | Freq: Once | ORAL | Status: AC
  Administered 2016-10-09: 2 via ORAL
  Filled 2016-10-09: qty 2

## 2016-10-09 NOTE — ED Provider Notes (Signed)
[facility] Emergency Department Provider Note    First MD Initiated Contact with Patient 10/09/16 2026     (approximate)  I have reviewed the triage vital signs and the nursing notes.   HISTORY  Chief Complaint Hand Pain    HPI Wendy Johnson is a 51 y.o. female presents with pain to the left middle finger due to retained remaining. Patient was trying to remove ring and she is going to have surgery as an outpatient next week. Has been unable to remove it due to severe swelling. Last time she removed the ring was over a year ago and has had significant weight gain since then. No fevers. No other injuries. Denies any other complaints.   Past Medical History:  Diagnosis Date  . ADHD (attention deficit hyperactivity disorder)   . Anxiety   . AR (allergic rhinitis)   . Arthritis   . Benign hypertension   . Chronic back pain   . Chronic constipation   . Chronic insomnia   . COPD (chronic obstructive pulmonary disease) ([place])   . Deaf   . Decreased dorsalis pedis pulse   . Depression   . Dyslipidemia   . Fatty liver   . GERD (gastroesophageal reflux disease)   . Hepatomegaly   . High cholesterol   . Hot flashes   . Migraine with aura   . OCD (obsessive compulsive disorder)   . Plantar warts   . Severe obesity ([place])   . Shortness of breath dyspnea   . Sleep apnea    C-PAP  . Tobacco use   . Trochanteric bursitis of right hip    Family History  Problem Relation Age of Onset  . Diabetes Mother   . Heart disease Father   . Heart attack Father   . Diabetes Sister   . Drug abuse Sister   . Breast cancer Maternal Aunt        <50  . Breast cancer Paternal Aunt        x2.  <50  . Drug abuse Son    Past Surgical History:  Procedure Laterality Date  . ABDOMINAL HYSTERECTOMY N/A 10/20/2014   Procedure: Total abdominial hysterectomy, bilateral salpingo-oophorectomy;  Surgeon: Brayton Mars, MD;  Location: [facility] ORS;  Service: Gynecology;   Laterality: N/A;  . BILATERAL SALPINGOOPHORECTOMY    . bone spurs removed Bilateral   . COLONOSCOPY WITH PROPOFOL N/A 09/01/2016   Procedure: COLONOSCOPY WITH PROPOFOL;  Surgeon: Jonathon Bellows, MD;  Location: [facility] ENDOSCOPY;  Service: Endoscopy;  Laterality: N/A;  . ESOPHAGOGASTRODUODENOSCOPY (EGD) WITH PROPOFOL N/A 09/01/2016   Procedure: ESOPHAGOGASTRODUODENOSCOPY (EGD) WITH PROPOFOL;  Surgeon: Jonathon Bellows, MD;  Location: [facility] ENDOSCOPY;  Service: Endoscopy;  Laterality: N/A;  . FOOT SURGERY Bilateral   . JOINT REPLACEMENT Right    Total Knee replacement X 2  . JOINT REPLACEMENT Bilateral    Total Hip Replacement  . knee arthroscopo Right   . LAPAROSCOPY  09/22/2014   Procedure: LAPAROSCOPY OPERATIVE;  Surgeon: Brayton Mars, MD;  Location: [facility] ORS;  Service: Gynecology;;  excision and fulgeration of endomertriosis  . LIPOMA EXCISION    . TONSILLECTOMY    . TOTAL HIP ARTHROPLASTY Left 12/16/2014   Procedure: TOTAL HIP ARTHROPLASTY ANTERIOR APPROACH;  Surgeon: Hessie Knows, MD;  Location: [facility] OR[facility];  Service: Orthopedics;  Laterality: Left;  . TOTAL HIP ARTHROPLASTY Right 05/12/2015   Procedure: TOTAL HIP ARTHROPLASTY ANTERIOR APPROACH;  Surgeon: Hessie Knows, MD;  Location: [facility] OR[facility];  Service: Orthopedics;  Laterality: Right;  . TOTAL KNEE REVISION Right 09/22/2015   Procedure: TOTAL KNEE REVISION/ REVISE POLYIETHYLENE;  Surgeon: Hessie Knows, MD;  Location: [facility] ORS;  Service: Orthopedics;  Laterality: Right;  . TUBAL LIGATION     Patient Active Problem List   Diagnosis Date Noted  . Rotator cuff tendinitis, right 07/29/2016  . Occipital neuralgia 10/12/2015  . Medication overuse headache 10/12/2015  . Instability of prosthetic knee ([place]) 09/22/2015  . Primary osteoarthritis of right hip 05/12/2015  . Sleep apnea 04/07/2015  . Primary osteoarthritis of left hip 12/16/2014  . Metabolic syndrome 38/75/6433  . Migraine without aura and without status migrainosus, not intractable  11/26/2014  . GERD without esophagitis 11/26/2014  . COPD, moderate ([place]) 11/26/2014  . Nocturnal oxygen desaturation 11/26/2014  . Supplemental oxygen dependent 11/26/2014  . Hearing loss 11/26/2014  . Chronic insomnia 11/26/2014  . History of hypertension 11/26/2014  . Dyslipidemia 11/26/2014  . Morbid obesity ([place]) 11/26/2014  . Diabetes mellitus type 2, diet-controlled ([place]) 11/26/2014  . Chronic constipation 11/26/2014  . Generalized anxiety disorder 11/11/2014  . DDD (degenerative disc disease), lumbar 11/11/2014  . Facet arthritis of lumbar region ([place]) 11/11/2014  . Primary osteoarthritis involving multiple joints 11/11/2014  . H/O hysterectomy for benign disease 10/20/2014  . History of endometriosis 10/16/2014  . Low back derangement syndrome 09/30/2014  . Degenerative joint disease of both lower legs 09/30/2014  . Bipolar disorder ([place]) 09/30/2014      Prior to Admission medications   Medication Sig Start Date End Date Taking? Authorizing Provider  amphetamine-dextroamphetamine (ADDERALL) 30 MG tablet Take 30 mg by mouth 2 (two) times daily.    Myer Haff, MD  ARIPiprazole (ABILIFY) 15 MG tablet Take 15 mg by mouth at bedtime.    Myer Haff, MD  busPIRone (BUSPAR) 30 MG tablet Take 30 mg by mouth every evening.  04/05/16   Myer Haff, MD  clonazePAM (KLONOPIN) 1 MG tablet Take 1-2 mg by mouth 3 (three) times daily as needed for anxiety (takes 2 every  morning and then 1-2 tablets as needed during the day for anxiety).     Myer Haff, MD  cyclobenzaprine (FLEXERIL) 10 MG tablet Take 1 tablet (10 mg total) by mouth 2 (two) times daily. Patient taking differently: Take 20 mg by mouth daily as needed for muscle spasms.  09/13/16   Molli Barrows, MD  dexlansoprazole (DEXILANT) 60 MG capsule Take 1 capsule (60 mg total) by mouth daily. 07/28/16   Steele Sizer, MD  diclofenac sodium (VOLTAREN) 1 % GEL Apply 4 g topically 4 (four) times daily. 08/23/16   Molli Barrows, MD    hydrOXYzine (ATARAX/VISTARIL) 25 MG tablet Take 25 mg by mouth daily as needed for anxiety or itching (hives).  09/06/16   [provider]  Oxycodone HCl 10 MG TABS Take 1 tablet (10 mg total) by mouth QID. 08/23/16   Molli Barrows, MD  polyethylene glycol (MIRALAX / Floria Raveling) packet Take 17 g by mouth daily.    [provider]  polyethylene glycol powder (GLYCOLAX/MIRALAX) powder Take 255 g by mouth daily. Patient not taking: Reported on 09/28/2016 09/13/16   Jonathon Bellows, MD  sertraline (ZOLOFT) 100 MG tablet Take 100 mg by mouth 2 (two) times daily.  06/02/16   [provider]  tiotropium (SPIRIVA) 18 MCG inhalation capsule Place 1 capsule (18 mcg total) into inhaler and inhale daily. 07/28/16   Steele Sizer, MD  traZODone (DESYREL) 150 MG tablet Take 300 mg by  mouth at bedtime.     Myer Haff, MD    Allergies Codeine and Imitrex [sumatriptan]    Social History Social History  Substance Use Topics  . Smoking status: Current Every Day Smoker    Packs/day: 0.50    Years: 37.00    Types: Cigarettes    Start date: 11/26/1979  . Smokeless tobacco: Never Used  . Alcohol use No    Review of Systems Patient denies headaches, rhinorrhea, blurry vision, numbness, shortness of breath, chest pain, edema, cough, abdominal pain, nausea, vomiting, diarrhea, dysuria, fevers, rashes or hallucinations unless otherwise stated above in HPI. ____________________________________________   PHYSICAL EXAM:  VITAL SIGNS: Vitals:   10/09/16 1947  BP: (!) 144/86  Pulse: (!) 107  Resp: 18  Temp: 98.3 F (36.8 C)    Constitutional: Alert and oriented. Well appearing and in no acute distress. Eyes: Conjunctivae are normal.  Head: Atraumatic. Nose: No congestion/rhinnorhea. Mouth/Throat: Mucous membranes are moist.   Neck: Painless ROM.  Cardiovascular:   Good peripheral circulation. Respiratory: Normal respiratory effort.  No retractions.  Gastrointestinal: Soft and  nontender.  Musculoskeletal: No lower extremity tenderness .  No joint effusions.  Swelling to 3rd and 4th digit of left hand,  Ring in-place with surrounding edema and skin irritation Neurologic:  Normal speech and language. No gross focal neurologic deficits are appreciated.  Skin:  Skin is warm, dry and intact. No rash noted. Psychiatric: Mood and affect are normal. Speech and behavior are normal. ____________________________________________   LABS (all labs ordered are listed, but only abnormal results are displayed)  No results found for this or any previous visit (from the past 24 hour(s)). ____________________________________________  EKG ____________________________________________  RADIOLOGY   ____________________________________________   PROCEDURES  Procedure(s) performed: yes Procedures Ring on left third digit was removed using ring cutters. Patient tolerated well. There was a small area of skin irritation and abrasion secondary to Ring removal. Patient provided bacitracin ointment and Band-Aid.   Critical Care performed: no ____________________________________________   INITIAL IMPRESSION / ASSESSMENT AND PLAN / ED COURSE  Pertinent labs & imaging results that were available during my care of the patient were reviewed by me and considered in my medical decision making (see chart for details).  DDX: ring removal, edema, ischemia  MARCAYLA BUDGE is a 50 y.o. who presents to the ED with need for ring removal. Ring removed without any Location. Neurovascularly intact distally. Patient tolerated well. Stable for discharge.      ____________________________________________   FINAL CLINICAL IMPRESSION(S) / ED DIAGNOSES  Final diagnoses:  Finger pain, left      NEW MEDICATIONS STARTED DURING THIS VISIT:  New Prescriptions   No medications on file     Note:  This document was prepared using Dragon voice recognition software and may include  unintentional dictation errors.     Merlyn Lot, MD 10/09/16 2047

## 2016-10-09 NOTE — ED Notes (Signed)
Pt is having surgery this week on her knee; was trying to removed her rings but one is not coming off; left hand middle digit with swelling that is preventing removal

## 2016-10-09 NOTE — ED Triage Notes (Signed)
Pt here with swelling to the left 3rd finger, pt states attempting to remove ring and the finger has gotten bigger. Pt needs help removing the ring

## 2016-10-09 NOTE — Discharge Instructions (Signed)
Keep finger elevated and ice to help with swelling.

## 2016-10-11 ENCOUNTER — Inpatient Hospital Stay: Payer: Medicare Other | Admitting: Certified Registered"

## 2016-10-11 ENCOUNTER — Encounter
Admission: RE | Disposition: A | Discharge status: Home Health | Payer: Self-pay | Source: Ambulatory Visit | Attending: Orthopedic Surgery

## 2016-10-11 ENCOUNTER — Encounter: Payer: Self-pay | Admitting: *Deleted

## 2016-10-11 ENCOUNTER — Inpatient Hospital Stay
Admission: RE | Admit: 2016-10-11 | Discharge: 2016-10-12 | DRG: 467 | Disposition: A | Discharge status: Home Health | Payer: Medicare Other | Source: Ambulatory Visit | Attending: Orthopedic Surgery | Admitting: Orthopedic Surgery

## 2016-10-11 DIAGNOSIS — I739 Peripheral vascular disease, unspecified: Secondary | ICD-10-CM | POA: Diagnosis not present

## 2016-10-11 DIAGNOSIS — Z96651 Presence of right artificial knee joint: Secondary | ICD-10-CM | POA: Diagnosis not present

## 2016-10-11 DIAGNOSIS — F17210 Nicotine dependence, cigarettes, uncomplicated: Secondary | ICD-10-CM | POA: Diagnosis present

## 2016-10-11 DIAGNOSIS — E785 Hyperlipidemia, unspecified: Secondary | ICD-10-CM | POA: Diagnosis present

## 2016-10-11 DIAGNOSIS — T84092A Other mechanical complication of internal right knee prosthesis, initial encounter: Secondary | ICD-10-CM | POA: Diagnosis not present

## 2016-10-11 DIAGNOSIS — I10 Essential (primary) hypertension: Secondary | ICD-10-CM | POA: Diagnosis present

## 2016-10-11 DIAGNOSIS — Z6841 Body Mass Index (BMI) 40.0 and over, adult: Secondary | ICD-10-CM

## 2016-10-11 DIAGNOSIS — T8484XA Pain due to internal orthopedic prosthetic devices, implants and grafts, initial encounter: Secondary | ICD-10-CM | POA: Diagnosis present

## 2016-10-11 DIAGNOSIS — F429 Obsessive-compulsive disorder, unspecified: Secondary | ICD-10-CM | POA: Diagnosis present

## 2016-10-11 DIAGNOSIS — E7800 Pure hypercholesterolemia, unspecified: Secondary | ICD-10-CM | POA: Diagnosis present

## 2016-10-11 DIAGNOSIS — Y792 Prosthetic and other implants, materials and accessory orthopedic devices associated with adverse incidents: Secondary | ICD-10-CM | POA: Diagnosis present

## 2016-10-11 DIAGNOSIS — M25561 Pain in right knee: Secondary | ICD-10-CM | POA: Diagnosis not present

## 2016-10-11 DIAGNOSIS — K760 Fatty (change of) liver, not elsewhere classified: Secondary | ICD-10-CM | POA: Diagnosis present

## 2016-10-11 DIAGNOSIS — F909 Attention-deficit hyperactivity disorder, unspecified type: Secondary | ICD-10-CM | POA: Diagnosis present

## 2016-10-11 DIAGNOSIS — E876 Hypokalemia: Secondary | ICD-10-CM | POA: Diagnosis present

## 2016-10-11 DIAGNOSIS — K219 Gastro-esophageal reflux disease without esophagitis: Secondary | ICD-10-CM | POA: Diagnosis present

## 2016-10-11 DIAGNOSIS — E119 Type 2 diabetes mellitus without complications: Secondary | ICD-10-CM | POA: Diagnosis not present

## 2016-10-11 DIAGNOSIS — J449 Chronic obstructive pulmonary disease, unspecified: Secondary | ICD-10-CM | POA: Diagnosis present

## 2016-10-11 HISTORY — PX: TOTAL KNEE REVISION: SHX996

## 2016-10-11 LAB — CBC
HEMATOCRIT: 37.9 % (ref 35.0–47.0)
HEMOGLOBIN: 12.9 g/dL (ref 12.0–16.0)
MCH: 27.9 pg (ref 26.0–34.0)
MCHC: 33.9 g/dL (ref 32.0–36.0)
MCV: 82.2 fL (ref 80.0–100.0)
Platelets: 202 10*3/uL (ref 150–440)
RBC: 4.62 MIL/uL (ref 3.80–5.20)
RDW: 16.5 % — ABNORMAL HIGH (ref 11.5–14.5)
WBC: 8.6 10*3/uL (ref 3.6–11.0)

## 2016-10-11 LAB — CREATININE, SERUM: Creatinine, Ser: 0.5 mg/dL (ref 0.44–1.00)

## 2016-10-11 SURGERY — TOTAL KNEE REVISION
Anesthesia: General | Site: Knee | Laterality: Right | Wound class: Clean

## 2016-10-11 MED ORDER — FENTANYL CITRATE (PF) 100 MCG/2ML IJ SOLN
INTRAMUSCULAR | Status: AC
  Filled 2016-10-11: qty 2

## 2016-10-11 MED ORDER — ROCURONIUM BROMIDE 50 MG/5ML IV SOLN
INTRAVENOUS | Status: AC
  Filled 2016-10-11: qty 1

## 2016-10-11 MED ORDER — PHENOL 1.4 % MT LIQD
1.0000 | OROMUCOSAL | Status: DC | PRN
  Filled 2016-10-11: qty 177

## 2016-10-11 MED ORDER — DEXAMETHASONE SODIUM PHOSPHATE 10 MG/ML IJ SOLN
INTRAMUSCULAR | Status: DC | PRN
Start: 2016-10-11 — End: 2016-10-11
  Administered 2016-10-11: 5 mg via INTRAVENOUS

## 2016-10-11 MED ORDER — CEFAZOLIN SODIUM-DEXTROSE 2-4 GM/100ML-% IV SOLN
INTRAVENOUS | Status: AC
  Filled 2016-10-11: qty 100

## 2016-10-11 MED ORDER — BUSPIRONE HCL 10 MG PO TABS
30.0000 mg | ORAL_TABLET | Freq: Every evening | ORAL | Status: DC
  Administered 2016-10-11: 30 mg via ORAL
  Filled 2016-10-11: qty 3

## 2016-10-11 MED ORDER — SODIUM CHLORIDE 0.9 % IV SOLN
INTRAVENOUS | Status: DC | PRN
  Administered 2016-10-11: 60 mL

## 2016-10-11 MED ORDER — FENTANYL CITRATE (PF) 100 MCG/2ML IJ SOLN
INTRAMUSCULAR | Status: DC | PRN
  Administered 2016-10-11: 50 ug via INTRAVENOUS
  Administered 2016-10-11: 100 ug via INTRAVENOUS
  Administered 2016-10-11: 50 ug via INTRAVENOUS

## 2016-10-11 MED ORDER — CLONAZEPAM 0.5 MG PO TABS
1.0000 mg | ORAL_TABLET | Freq: Three times a day (TID) | ORAL | Status: DC | PRN
  Administered 2016-10-11 – 2016-10-12 (×3): 2 mg via ORAL
  Filled 2016-10-11: qty 4
  Filled 2016-10-11: qty 2
  Filled 2016-10-11: qty 4
  Filled 2016-10-11: qty 2

## 2016-10-11 MED ORDER — ONDANSETRON HCL 4 MG PO TABS: 4.0000 mg | ORAL_TABLET | Freq: Four times a day (QID) | ORAL | Status: DC | PRN

## 2016-10-11 MED ORDER — DIPHENHYDRAMINE HCL 12.5 MG/5ML PO ELIX: 12.5000 mg | ORAL_SOLUTION | ORAL | Status: DC | PRN

## 2016-10-11 MED ORDER — ACETAMINOPHEN 10 MG/ML IV SOLN
INTRAVENOUS | Status: AC
  Filled 2016-10-11: qty 100

## 2016-10-11 MED ORDER — TRAZODONE HCL 100 MG PO TABS
300.0000 mg | ORAL_TABLET | Freq: Every day | ORAL | Status: DC
  Administered 2016-10-11: 300 mg via ORAL
  Filled 2016-10-11: qty 3

## 2016-10-11 MED ORDER — NEOMYCIN-POLYMYXIN B GU 40-200000 IR SOLN
Status: AC
  Filled 2016-10-11: qty 20

## 2016-10-11 MED ORDER — FENTANYL CITRATE (PF) 100 MCG/2ML IJ SOLN
25.0000 ug | INTRAMUSCULAR | Status: DC | PRN
  Administered 2016-10-11 (×5): 25 ug via INTRAVENOUS

## 2016-10-11 MED ORDER — METOCLOPRAMIDE HCL 5 MG/ML IJ SOLN: 5.0000 mg | Freq: Three times a day (TID) | INTRAMUSCULAR | Status: DC | PRN

## 2016-10-11 MED ORDER — VANCOMYCIN HCL 1000 MG IV SOLR
INTRAVENOUS | Status: AC
Start: 2016-10-11 — End: 2016-10-11
  Filled 2016-10-11: qty 1000

## 2016-10-11 MED ORDER — SODIUM CHLORIDE 0.9 % IV SOLN
1000.0000 mg | INTRAVENOUS | Status: AC
  Administered 2016-10-11: 1000 mg via INTRAVENOUS
  Filled 2016-10-11: qty 10

## 2016-10-11 MED ORDER — MAGNESIUM CITRATE PO SOLN
1.0000 | Freq: Once | ORAL | Status: DC | PRN
  Filled 2016-10-11: qty 296

## 2016-10-11 MED ORDER — ENOXAPARIN SODIUM 30 MG/0.3ML ~~LOC~~ SOLN
30.0000 mg | Freq: Two times a day (BID) | SUBCUTANEOUS | Status: DC
  Filled 2016-10-11: qty 0.3

## 2016-10-11 MED ORDER — HYDROMORPHONE HCL 1 MG/ML IJ SOLN
INTRAMUSCULAR | Status: AC
  Filled 2016-10-11: qty 1

## 2016-10-11 MED ORDER — TIOTROPIUM BROMIDE MONOHYDRATE 18 MCG IN CAPS
18.0000 ug | ORAL_CAPSULE | Freq: Every day | RESPIRATORY_TRACT | Status: DC
  Administered 2016-10-12: 18 ug via RESPIRATORY_TRACT
  Filled 2016-10-11: qty 5

## 2016-10-11 MED ORDER — ALUM & MAG HYDROXIDE-SIMETH 200-200-20 MG/5ML PO SUSP: 30.0000 mL | ORAL | Status: DC | PRN

## 2016-10-11 MED ORDER — DOCUSATE SODIUM 100 MG PO CAPS
100.0000 mg | ORAL_CAPSULE | Freq: Two times a day (BID) | ORAL | Status: DC
  Administered 2016-10-11 – 2016-10-12 (×3): 100 mg via ORAL
  Filled 2016-10-11 (×3): qty 1

## 2016-10-11 MED ORDER — OXYCODONE HCL 5 MG PO TABS
10.0000 mg | ORAL_TABLET | Freq: Four times a day (QID) | ORAL | Status: DC
  Administered 2016-10-11 – 2016-10-12 (×5): 10 mg via ORAL
  Filled 2016-10-11 (×4): qty 2

## 2016-10-11 MED ORDER — ACETAMINOPHEN 325 MG PO TABS: 650.0000 mg | ORAL_TABLET | Freq: Four times a day (QID) | ORAL | Status: DC | PRN

## 2016-10-11 MED ORDER — MORPHINE SULFATE (PF) 2 MG/ML IV SOLN: 2.0000 mg | INTRAVENOUS | Status: DC | PRN

## 2016-10-11 MED ORDER — LIDOCAINE HCL (CARDIAC) 20 MG/ML IV SOLN
INTRAVENOUS | Status: DC | PRN
  Administered 2016-10-11: 100 mg via INTRAVENOUS

## 2016-10-11 MED ORDER — HYDROMORPHONE HCL 1 MG/ML IJ SOLN
INTRAMUSCULAR | Status: DC | PRN
  Administered 2016-10-11 (×2): 0.5 mg via INTRAVENOUS

## 2016-10-11 MED ORDER — SODIUM CHLORIDE 0.9 % IV SOLN
INTRAVENOUS | Status: DC
  Administered 2016-10-11: 13:00:00 via INTRAVENOUS

## 2016-10-11 MED ORDER — OXYCODONE HCL 5 MG/5ML PO SOLN: 5.0000 mg | Freq: Once | ORAL | Status: DC | PRN

## 2016-10-11 MED ORDER — ARIPIPRAZOLE 5 MG PO TABS
15.0000 mg | ORAL_TABLET | Freq: Every day | ORAL | Status: DC
  Administered 2016-10-11: 15 mg via ORAL
  Filled 2016-10-11 (×2): qty 3

## 2016-10-11 MED ORDER — ROCURONIUM BROMIDE 100 MG/10ML IV SOLN
INTRAVENOUS | Status: DC | PRN
  Administered 2016-10-11: 10 mg via INTRAVENOUS
  Administered 2016-10-11: 40 mg via INTRAVENOUS

## 2016-10-11 MED ORDER — NEOMYCIN-POLYMYXIN B GU 40-200000 IR SOLN
Status: DC | PRN
  Administered 2016-10-11: 16 mL

## 2016-10-11 MED ORDER — CEFAZOLIN SODIUM-DEXTROSE 2-4 GM/100ML-% IV SOLN
2.0000 g | Freq: Four times a day (QID) | INTRAVENOUS | Status: AC
  Administered 2016-10-11 (×2): 2 g via INTRAVENOUS
  Filled 2016-10-11 (×3): qty 100

## 2016-10-11 MED ORDER — OXYCODONE HCL 5 MG PO TABS: 5.0000 mg | ORAL_TABLET | Freq: Once | ORAL | Status: DC | PRN

## 2016-10-11 MED ORDER — MIDAZOLAM HCL 2 MG/2ML IJ SOLN
INTRAMUSCULAR | Status: AC
  Filled 2016-10-11: qty 2

## 2016-10-11 MED ORDER — ONDANSETRON HCL 4 MG/2ML IJ SOLN: 4.0000 mg | Freq: Four times a day (QID) | INTRAMUSCULAR | Status: DC | PRN

## 2016-10-11 MED ORDER — SUGAMMADEX SODIUM 200 MG/2ML IV SOLN
INTRAVENOUS | Status: DC | PRN
Start: 2016-10-11 — End: 2016-10-11
  Administered 2016-10-11: 200 mg via INTRAVENOUS

## 2016-10-11 MED ORDER — METHOCARBAMOL 500 MG PO TABS
500.0000 mg | ORAL_TABLET | Freq: Four times a day (QID) | ORAL | Status: DC | PRN
  Administered 2016-10-11: 500 mg via ORAL
  Filled 2016-10-11: qty 1

## 2016-10-11 MED ORDER — BISACODYL 10 MG RE SUPP: 10.0000 mg | Freq: Every day | RECTAL | Status: DC | PRN

## 2016-10-11 MED ORDER — MENTHOL 3 MG MT LOZG
1.0000 | LOZENGE | OROMUCOSAL | Status: DC | PRN
  Filled 2016-10-11: qty 9

## 2016-10-11 MED ORDER — OXYCODONE HCL 5 MG PO TABS
5.0000 mg | ORAL_TABLET | ORAL | Status: DC | PRN
  Administered 2016-10-11 (×2): 5 mg via ORAL
  Administered 2016-10-11: 10 mg via ORAL
  Filled 2016-10-11 (×2): qty 2
  Filled 2016-10-11: qty 1
  Filled 2016-10-11: qty 2

## 2016-10-11 MED ORDER — ONDANSETRON HCL 4 MG/2ML IJ SOLN
INTRAMUSCULAR | Status: DC | PRN
  Administered 2016-10-11: 4 mg via INTRAVENOUS

## 2016-10-11 MED ORDER — VANCOMYCIN HCL 1000 MG IV SOLR
INTRAVENOUS | Status: DC | PRN
  Administered 2016-10-11: 1000 mg

## 2016-10-11 MED ORDER — PANTOPRAZOLE SODIUM 40 MG PO TBEC
40.0000 mg | DELAYED_RELEASE_TABLET | Freq: Every day | ORAL | Status: DC
  Administered 2016-10-12: 40 mg via ORAL
  Filled 2016-10-11 (×2): qty 1

## 2016-10-11 MED ORDER — PROPOFOL 10 MG/ML IV BOLUS
INTRAVENOUS | Status: AC
  Filled 2016-10-11: qty 20

## 2016-10-11 MED ORDER — AMPHETAMINE-DEXTROAMPHETAMINE 5 MG PO TABS
30.0000 mg | ORAL_TABLET | Freq: Two times a day (BID) | ORAL | Status: DC
  Administered 2016-10-11 – 2016-10-12 (×3): 30 mg via ORAL
  Filled 2016-10-11 (×3): qty 6

## 2016-10-11 MED ORDER — ACETAMINOPHEN 650 MG RE SUPP: 650.0000 mg | Freq: Four times a day (QID) | RECTAL | Status: DC | PRN

## 2016-10-11 MED ORDER — MAGNESIUM HYDROXIDE 400 MG/5ML PO SUSP
30.0000 mL | Freq: Every day | ORAL | Status: DC | PRN
  Administered 2016-10-11: 30 mL via ORAL
  Filled 2016-10-11: qty 30

## 2016-10-11 MED ORDER — ONDANSETRON HCL 4 MG/2ML IJ SOLN
INTRAMUSCULAR | Status: AC
  Filled 2016-10-11: qty 2

## 2016-10-11 MED ORDER — MORPHINE SULFATE (PF) 10 MG/ML IV SOLN
INTRAVENOUS | Status: AC
  Filled 2016-10-11: qty 1

## 2016-10-11 MED ORDER — METOCLOPRAMIDE HCL 10 MG PO TABS: 5.0000 mg | ORAL_TABLET | Freq: Three times a day (TID) | ORAL | Status: DC | PRN

## 2016-10-11 MED ORDER — ACETAMINOPHEN 10 MG/ML IV SOLN
INTRAVENOUS | Status: DC | PRN
  Administered 2016-10-11: 1000 mg via INTRAVENOUS

## 2016-10-11 MED ORDER — SUCCINYLCHOLINE CHLORIDE 20 MG/ML IJ SOLN
INTRAMUSCULAR | Status: AC
  Filled 2016-10-11: qty 1

## 2016-10-11 MED ORDER — PROPOFOL 10 MG/ML IV BOLUS
INTRAVENOUS | Status: DC | PRN
  Administered 2016-10-11: 200 mg via INTRAVENOUS

## 2016-10-11 MED ORDER — SUGAMMADEX SODIUM 200 MG/2ML IV SOLN
INTRAVENOUS | Status: AC
  Filled 2016-10-11: qty 2

## 2016-10-11 MED ORDER — BUPIVACAINE-EPINEPHRINE 0.25% -1:200000 IJ SOLN: INTRAMUSCULAR | Status: DC | PRN

## 2016-10-11 MED ORDER — GLYCOPYRROLATE 0.2 MG/ML IJ SOLN
INTRAMUSCULAR | Status: DC | PRN
  Administered 2016-10-11: 0.2 mg via INTRAVENOUS

## 2016-10-11 MED ORDER — DEXAMETHASONE SODIUM PHOSPHATE 10 MG/ML IJ SOLN
INTRAMUSCULAR | Status: AC
  Filled 2016-10-11: qty 1

## 2016-10-11 MED ORDER — METHOCARBAMOL 1000 MG/10ML IJ SOLN
500.0000 mg | Freq: Four times a day (QID) | INTRAVENOUS | Status: DC | PRN
  Filled 2016-10-11: qty 5

## 2016-10-11 MED ORDER — LACTATED RINGERS IV SOLN
INTRAVENOUS | Status: DC
  Administered 2016-10-11 (×2): via INTRAVENOUS

## 2016-10-11 MED ORDER — CEFAZOLIN SODIUM-DEXTROSE 2-4 GM/100ML-% IV SOLN
2.0000 g | Freq: Once | INTRAVENOUS | Status: AC
  Administered 2016-10-11: 2 g via INTRAVENOUS

## 2016-10-11 MED ORDER — ZOLPIDEM TARTRATE 5 MG PO TABS: 5.0000 mg | ORAL_TABLET | Freq: Every evening | ORAL | Status: DC | PRN

## 2016-10-11 MED ORDER — LIDOCAINE HCL (PF) 2 % IJ SOLN
INTRAMUSCULAR | Status: AC
  Filled 2016-10-11: qty 2

## 2016-10-11 MED ORDER — BUPIVACAINE LIPOSOME 1.3 % IJ SUSP
INTRAMUSCULAR | Status: AC
  Filled 2016-10-11: qty 20

## 2016-10-11 MED ORDER — BUPIVACAINE-EPINEPHRINE (PF) 0.25% -1:200000 IJ SOLN
INTRAMUSCULAR | Status: AC
  Filled 2016-10-11: qty 30

## 2016-10-11 MED ORDER — SODIUM CHLORIDE 0.9 % IJ SOLN
INTRAMUSCULAR | Status: AC
  Filled 2016-10-11: qty 50

## 2016-10-11 MED ORDER — MIDAZOLAM HCL 2 MG/2ML IJ SOLN
INTRAMUSCULAR | Status: DC | PRN
  Administered 2016-10-11: 2 mg via INTRAVENOUS

## 2016-10-11 MED ORDER — MORPHINE SULFATE (PF) 10 MG/ML IV SOLN: INTRAVENOUS | Status: DC | PRN

## 2016-10-11 MED ORDER — SERTRALINE HCL 100 MG PO TABS
100.0000 mg | ORAL_TABLET | Freq: Two times a day (BID) | ORAL | Status: DC
  Administered 2016-10-11 – 2016-10-12 (×3): 100 mg via ORAL
  Filled 2016-10-11 (×3): qty 1

## 2016-10-11 MED ORDER — POLYETHYLENE GLYCOL 3350 17 G PO PACK
17.0000 g | PACK | Freq: Every day | ORAL | Status: DC
  Administered 2016-10-11: 17 g via ORAL
  Filled 2016-10-11 (×2): qty 1

## 2016-10-11 SURGICAL SUPPLY — 61 items
BANDAGE ACE 6X5 VEL STRL LF (GAUZE/BANDAGES/DRESSINGS) ×2 IMPLANT
BLADE SAW 1 (BLADE) IMPLANT
BLADE SAW 1/2 (BLADE) IMPLANT
CANISTER SUCT 1200ML W/VALVE (MISCELLANEOUS) ×2 IMPLANT
CANISTER SUCT 3000ML PPV (MISCELLANEOUS) ×4 IMPLANT
CATH FOL LEG HOLDER (MISCELLANEOUS) ×2 IMPLANT
CATH TRAY METER 16FR LF (MISCELLANEOUS) ×2 IMPLANT
CHLORAPREP W/TINT 26ML (MISCELLANEOUS) ×4 IMPLANT
COOLER POLAR GLACIER W/PUMP (MISCELLANEOUS) ×2 IMPLANT
COVER TABLE BACK 60X90 (DRAPES) ×2 IMPLANT
CUFF TOURN 24 STER (MISCELLANEOUS) IMPLANT
CUFF TOURN 30 STER DUAL PORT (MISCELLANEOUS) ×2 IMPLANT
DRAPE SHEET LG 3/4 BI-LAMINATE (DRAPES) ×8 IMPLANT
ELECT CAUTERY BLADE 6.4 (BLADE) ×2 IMPLANT
ELECT REM PT RETURN 9FT ADLT (ELECTROSURGICAL) ×2
ELECTRODE REM PT RTRN 9FT ADLT (ELECTROSURGICAL) ×1 IMPLANT
GAUZE PETRO XEROFOAM 1X8 (MISCELLANEOUS) IMPLANT
GAUZE SPONGE 4X4 12PLY STRL (GAUZE/BANDAGES/DRESSINGS) IMPLANT
GLOVE BIOGEL PI IND STRL 9 (GLOVE) ×1 IMPLANT
GLOVE BIOGEL PI INDICATOR 9 (GLOVE) ×1
GLOVE INDICATOR 8.0 STRL GRN (GLOVE) ×2 IMPLANT
GLOVE SURG ORTHO 8.0 STRL STRW (GLOVE) ×2 IMPLANT
GLOVE SURG SYN 9.0  PF PI (GLOVE) ×1
GLOVE SURG SYN 9.0 PF PI (GLOVE) ×1 IMPLANT
GOWN SRG 2XL LVL 4 RGLN SLV (GOWNS) ×1 IMPLANT
GOWN STRL NON-REIN 2XL LVL4 (GOWNS) ×1
GOWN STRL REUS W/ TWL LRG LVL3 (GOWN DISPOSABLE) ×1 IMPLANT
GOWN STRL REUS W/ TWL XL LVL3 (GOWN DISPOSABLE) ×1 IMPLANT
GOWN STRL REUS W/TWL LRG LVL3 (GOWN DISPOSABLE) ×1
GOWN STRL REUS W/TWL XL LVL3 (GOWN DISPOSABLE) ×1
HOOD PEEL AWAY FLYTE STAYCOOL (MISCELLANEOUS) ×4 IMPLANT
IMMBOLIZER KNEE 19 BLUE UNIV (SOFTGOODS) ×2 IMPLANT
INSERT TC3 TIBIAL SZ3 20.0 (Knees) ×2 IMPLANT
KIT PREVENA INCISION MGT 13 (CANNISTER) ×2 IMPLANT
KIT RM TURNOVER STRD PROC AR (KITS) ×2 IMPLANT
KIT STIMULAN RAPID CURE  10CC (Orthopedic Implant) ×1 IMPLANT
KIT STIMULAN RAPID CURE 10CC (Orthopedic Implant) ×1 IMPLANT
KNIFE SCULPS 14X20 (INSTRUMENTS) ×2 IMPLANT
NEEDLE SPNL 18GX3.5 QUINCKE PK (NEEDLE) ×2 IMPLANT
NEEDLE SPNL 20GX3.5 QUINCKE YW (NEEDLE) ×2 IMPLANT
NS IRRIG 1000ML POUR BTL (IV SOLUTION) ×2 IMPLANT
PACK TOTAL KNEE (MISCELLANEOUS) ×2 IMPLANT
PAD NEG PRESSURE SENSATRAC (MISCELLANEOUS) ×2 IMPLANT
PAD WRAPON POLAR KNEE (MISCELLANEOUS) ×1 IMPLANT
PULSAVAC PLUS IRRIG FAN TIP (DISPOSABLE) ×2
SOL .9 NS 3000ML IRR  AL (IV SOLUTION) ×1
SOL .9 NS 3000ML IRR UROMATIC (IV SOLUTION) ×1 IMPLANT
STAPLER SKIN PROX 35W (STAPLE) ×2 IMPLANT
SUCTION FRAZIER HANDLE 10FR (MISCELLANEOUS) ×1
SUCTION TUBE FRAZIER 10FR DISP (MISCELLANEOUS) ×1 IMPLANT
SUT DVC 2 QUILL PDO  T11 36X36 (SUTURE) ×1
SUT DVC 2 QUILL PDO T11 36X36 (SUTURE) ×1 IMPLANT
SUT DVC QUILL MONODERM 30X30 (SUTURE) ×2 IMPLANT
SUT TICRON 2-0 30IN 311381 (SUTURE) IMPLANT
SWAB CULTURE AMIES ANAERIB BLU (MISCELLANEOUS) IMPLANT
SYR 20CC LL (SYRINGE) ×2 IMPLANT
SYR 50ML LL SCALE MARK (SYRINGE) ×4 IMPLANT
TIP FAN IRRIG PULSAVAC PLUS (DISPOSABLE) ×1 IMPLANT
TOWEL OR 17X26 4PK STRL BLUE (TOWEL DISPOSABLE) ×2 IMPLANT
TOWER CARTRIDGE SMART MIX (DISPOSABLE) ×2 IMPLANT
WRAPON POLAR PAD KNEE (MISCELLANEOUS) ×2

## 2016-10-11 NOTE — Anesthesia Post-op Follow-up Note (Cosign Needed)
Anesthesia QCDR form completed.        

## 2016-10-11 NOTE — Transfer of Care (Signed)
Immediate Anesthesia Transfer of Care Note  Patient: Wendy Johnson  Procedure(s) Performed: Procedure(s): TOTAL KNEE REVISION (Right)  Patient Location: PACU  Anesthesia Type:General  Level of Consciousness: awake  Airway & Oxygen Therapy: Patient Spontanous Breathing and Patient connected to face mask oxygen  Post-op Assessment: Report given to RN and Post -op Vital signs reviewed and stable  Post vital signs: Reviewed  Last Vitals:  Vitals:   10/11/16 1026 10/11/16 1028  BP: (P) 119/77 119/77  Pulse: (P) 98 97  Resp: (P) 16 16  Temp: (!) (P) 36 C 36.1 C    Last Pain:  Vitals:   10/11/16 0703  TempSrc: Oral  PainSc: 7          Complications: No apparent anesthesia complications

## 2016-10-11 NOTE — H&P (Signed)
Reviewed paper H+P, will be scanned into chart. No changes noted.  

## 2016-10-11 NOTE — Op Note (Addendum)
10/11/2016  10:27 AM  PATIENT:  Wendy Johnson  51 y.o. female  PRE-OPERATIVE DIAGNOSIS:  ACUTE PAIN OF RIGHT KNEE laxity to right total knee    POST-OPERATIVE DIAGNOSIS:  ACUTE PAIN OF RIGHT KNEE same  PROCEDURE:  Procedure(s): TOTAL KNEE REVISION (Right)  SURGEON: Laurene Footman, MD  ASSISTANTS: Rachelle Hora, [facility]  ANESTHESIA:   general  EBL:  Total I/O In: 1000 [I.V.:1000] Out: 300 [Urine:200; Blood:100]  BLOOD ADMINISTERED:none  DRAINS: none   LOCAL MEDICATIONS USED:  OTHER Exparel  SPECIMEN:  No Specimen  DISPOSITION OF SPECIMEN:  N/A  COUNTS:  YES  TOURNIQUET:    IMPLANTS: DePuy Sigma 3 20 mm TC3  DICTATION: .Dragon Dictation Dragon not functioning. Revision performed with standard technique and Stimulan beads also placed. She'll brought the operating room and after adequate anesthesia was obtained the right leg was prepped and draped in sterile fashion. After patient identification and timeout procedures were completed a prescription midline skin incision was made after raising the tourniquet followed by medial parapatellar arthrotomy. There was moderate synovitis within the joint and on varus valgus stressors opening both medial and lateral sides. The the capsules opened so that the anterior tibia could be exposed and the scar tissue and removed around the proximal tibial portion of the implant with the removal of scar posterior to the patellar tendon to mobilize the patella. The prior polyethylene component was then removed and scar tissue debrided from the back of the knee as well as some synovitis within the joint. After this was completed trials were placed with a 17-1/2 and a 20 mm implant and the 20 mm and ligated very good stability as well as and was not required. After of giving giving injection of X Burrell throughout the joint the irrigating the knee the 20 mm insert was placed and the patella tracked well with no touch technique there is excellent stability  and extension flexion and mid flexion the arthrotomy was then repaired using a heavy Quill, followed by a subcuticular barbed wire closure followed by skin staples. A incisional wound VAC was applied     PLAN OF CARE: Admit to inpatient   PATIENT DISPOSITION:  PACU - hemodynamically stable.

## 2016-10-11 NOTE — Evaluation (Signed)
Physical Therapy Evaluation Patient Details Name: Wendy Johnson MRN: [id number] DOB: December 01, 1965 Today's Date: 10/11/2016   History of Present Illness  51 y/o female here post R total knee poly exchange.  She has had multiple replacements/revisions in this leg over the last few years.  Clinical Impression  Pt with considerable pain t/o the effort and showed inability to achieve near [facility] but she did well and showed great motivation. Though she had minimal quad control or ability to do SLRs she was relatively functional with mobility and ambulation.  She responded well to gait training cues and was able to ambulate ~60 ft safely with cuing.  She also showed great motivation with ~15 minutes of supine LE exercises.  Pt's biggest issue at this time appears to be lacking >10 degrees of knee extension.    Follow Up Recommendations Home health PT    Equipment Recommendations  Rolling walker with 5" wheels    Recommendations for Other Services       Precautions / Restrictions Precautions Precautions: Fall;Knee Restrictions RLE Weight Bearing: Weight bearing as tolerated      Mobility  Bed Mobility Overal bed mobility: Modified Independent             General bed mobility comments: Pt able to get herself to EOB w/o assist, needed L LE and UEs to get R LE to EOB  Transfers Overall transfer level: Modified independent Equipment used: Rolling walker (2 wheeled)             General transfer comment: Pt able to rise to standing, despite cuing she used b/l UEs on walker and was able to rise safely.   Ambulation/Gait Ambulation/Gait assistance: Supervision;Min guard Ambulation Distance (Feet): 60 Feet Assistive device: Rolling walker (2 wheeled)       General Gait Details: Pt showed good overall confidence with ambulation and though she reported pain with the effort she was able to maintain consistent walker movement and displayed good tolerance with O2 staying in 90s on 2  liters.   Stairs            Wheelchair Mobility    Modified Rankin (Stroke Patients Only)       Balance Overall balance assessment: Modified Independent                                           Pertinent Vitals/Pain Pain Assessment: 0-10 Pain Score: 10-Worst pain ever    Home Living Family/patient expects to be discharged to:: Private residence Living Arrangements: Spouse/significant other   Type of Home: House Home Access: Stairs to enter Entrance Stairs-Rails: Can reach both Entrance Stairs-Number of Steps: 3          Prior Function Level of Independence: Independent         Comments: Pt has struggled with R knee pain, but generally is able to be somewhat active     Hand Dominance        Extremity/Trunk Assessment   Upper Extremity Assessment Upper Extremity Assessment: Overall WFL for tasks assessed    Lower Extremity Assessment Lower Extremity Assessment: RLE deficits/detail RLE Deficits / Details: Pt showed grossly 4/5 strength in R hip and ankle, knee grossly 3-/5. Some engagement of quads, needed L LE to do SLRs.       Communication   Communication: No difficulties  Cognition Arousal/Alertness: Awake/alert Behavior During Therapy: WFL for tasks  assessed/performed;Anxious Overall Cognitive Status: Within Functional Limits for tasks assessed                                        General Comments      Exercises Total Joint Exercises Ankle Circles/Pumps: Strengthening;10 reps Quad Sets: Strengthening;10 reps Gluteal Sets: Strengthening;10 reps Short Arc Quad: AAROM;10 reps Heel Slides: AROM;Strengthening;10 reps Hip ABduction/ADduction: Strengthening;10 reps Straight Leg Raises: AAROM;5 reps Knee Flexion: PROM;5 reps Goniometric ROM: 13-81   Assessment/Plan    PT Assessment Patient needs continued PT services  PT Problem List Decreased strength;Decreased range of motion;Decreased activity  tolerance;Decreased balance;Decreased mobility;Decreased coordination;Decreased safety awareness;Pain;Decreased knowledge of use of DME;Cardiopulmonary status limiting activity       PT Treatment Interventions Gait training;DME instruction;Stair training;Functional mobility training;Therapeutic activities;Therapeutic exercise;Balance training;Patient/family education;Neuromuscular re-education    PT Goals (Current goals can be found in the Care Plan section)  Acute Rehab PT Goals Patient Stated Goal: go home PT Goal Formulation: With patient Time For Goal Achievement: 10/25/16 Potential to Achieve Goals: Fair    Frequency BID   Barriers to discharge        Co-evaluation               AM-PAC PT "6 Clicks" Daily Activity  Outcome Measure Difficulty turning over in bed (including adjusting bedclothes, sheets and blankets)?: A Little Difficulty moving from lying on back to sitting on the side of the bed? : A Little Difficulty sitting down on and standing up from a chair with arms (e.g., wheelchair, bedside commode, etc,.)?: A Little Help needed moving to and from a bed to chair (including a wheelchair)?: None Help needed walking in hospital room?: None Help needed climbing 3-5 steps with a railing? : A Lot 6 Click Score: 19    End of Session Equipment Utilized During Treatment: Gait belt Activity Tolerance: Patient tolerated treatment well;Patient limited by pain Patient left: with chair alarm set;with call bell/phone within reach   PT Visit Diagnosis: Muscle weakness (generalized) (M62.81);Difficulty in walking, not elsewhere classified (R26.2)    Time: 0786-7544 PT Time Calculation (min) (ACUTE ONLY): 47 min   Charges:   PT Evaluation $PT Eval Low Complexity: 1 Procedure PT Treatments $Gait Training: 8-22 mins $Therapeutic Exercise: 8-22 mins   PT G Codes:        Kreg Shropshire, DPT 10/11/2016, 5:40 PM

## 2016-10-11 NOTE — Anesthesia Postprocedure Evaluation (Signed)
Anesthesia Post Note  Patient: Wendy Johnson  Procedure(s) Performed: Procedure(s) (LRB): TOTAL KNEE REVISION (Right)  Patient location during evaluation: PACU Anesthesia Type: General Level of consciousness: awake and alert Pain management: pain level controlled Vital Signs Assessment: post-procedure vital signs reviewed and stable Respiratory status: spontaneous breathing, nonlabored ventilation, respiratory function stable and patient connected to nasal cannula oxygen Cardiovascular status: blood pressure returned to baseline and stable Postop Assessment: no signs of nausea or vomiting Anesthetic complications: no     Last Vitals:  Vitals:   10/11/16 1112 10/11/16 1127  BP: (!) 149/91 127/82  Pulse: 99 (!) 103  Resp: 16 20  Temp:      Last Pain:  Vitals:   10/11/16 1127  TempSrc:   PainSc: 5                  Precious Haws Agustin Swatek

## 2016-10-11 NOTE — Anesthesia Preprocedure Evaluation (Signed)
Anesthesia Evaluation  Patient identified by MRN, date of birth, ID band Patient awake    Reviewed: Allergy & Precautions, H&P , NPO status , Patient's Chart, lab work & pertinent test results  Airway Mallampati: III  TM Distance: >3 FB Neck ROM: full    Dental  (+) Chipped   Pulmonary shortness of breath and with exertion, sleep apnea , COPD, Current Smoker,    Pulmonary exam normal breath sounds clear to auscultation       Cardiovascular Exercise Tolerance: Good hypertension, (-) angina+ Peripheral Vascular Disease  (-) Past MI Normal cardiovascular exam Rhythm:regular Rate:Normal     Neuro/Psych  Headaches, PSYCHIATRIC DISORDERS Anxiety Depression Bipolar Disorder    GI/Hepatic negative GI ROS, Neg liver ROS, GERD  ,  Endo/Other  diabetes, Type 2  Renal/GU      Musculoskeletal  (+) Arthritis ,   Abdominal   Peds  Hematology negative hematology ROS (+)   Anesthesia Other Findings Past Medical History: No date: ADHD (attention deficit hyperactivity disorder) No date: Anxiety No date: AR (allergic rhinitis) No date: Arthritis No date: Benign hypertension No date: Chronic back pain No date: Chronic constipation No date: Chronic insomnia No date: COPD (chronic obstructive pulmonary disease) (* No date: Deaf No date: Decreased dorsalis pedis pulse No date: Depression No date: Dyslipidemia No date: Fatty liver No date: GERD (gastroesophageal reflux disease) No date: Hepatomegaly No date: High cholesterol No date: Hot flashes No date: Migraine with aura No date: OCD (obsessive compulsive disorder) No date: Plantar warts No date: Severe obesity (HCC) No date: Shortness of breath dyspnea No date: Sleep apnea     Comment: C-PAP No date: Tobacco use No date: Trochanteric bursitis of right hip  Past Surgical History: 10/20/2014: ABDOMINAL HYSTERECTOMY N/A     Comment: Procedure: Total abdominial  hysterectomy,               bilateral salpingo-oophorectomy;  Surgeon:               Brayton Mars, MD;  Location: [facility] OR[facility];               Service: Gynecology;  Laterality: N/A; No date: BILATERAL SALPINGOOPHORECTOMY No date: bone spurs removed Bilateral 09/01/2016: COLONOSCOPY WITH PROPOFOL N/A     Comment: Procedure: COLONOSCOPY WITH PROPOFOL;                Surgeon: Jonathon Bellows, MD;  Location: [facility]               ENDOSCOPY;  Service: Endoscopy;  Laterality:               N/A; 09/01/2016: ESOPHAGOGASTRODUODENOSCOPY (EGD) WITH PROPOFOL N/A     Comment: Procedure: ESOPHAGOGASTRODUODENOSCOPY (EGD)               WITH PROPOFOL;  Surgeon: Jonathon Bellows, MD;                Location: [facility] ENDOSCOPY;  Service: Endoscopy;               Laterality: N/A; No date: FOOT SURGERY Bilateral No date: JOINT REPLACEMENT Right     Comment: Total Knee replacement X 2 No date: JOINT REPLACEMENT Bilateral     Comment: Total Hip Replacement No date: knee arthroscopo Right 09/22/2014: LAPAROSCOPY     Comment: Procedure: LAPAROSCOPY OPERATIVE;  Surgeon:               Brayton Mars, MD;  Location: [facility];  Service: Gynecology;;  excision and fulgeration              of endomertriosis No date: LIPOMA EXCISION No date: TONSILLECTOMY 12/16/2014: TOTAL HIP ARTHROPLASTY Left     Comment: Procedure: TOTAL HIP ARTHROPLASTY ANTERIOR               APPROACH;  Surgeon: Hessie Knows, MD;                Location: [facility] OR[facility];  Service: Orthopedics;                Laterality: Left; 05/12/2015: TOTAL HIP ARTHROPLASTY Right     Comment: Procedure: TOTAL HIP ARTHROPLASTY ANTERIOR               APPROACH;  Surgeon: Hessie Knows, MD;                Location: [facility] OR[facility];  Service: Orthopedics;                Laterality: Right; 09/22/2015: TOTAL KNEE REVISION Right     Comment: Procedure: TOTAL KNEE REVISION/ REVISE               POLYIETHYLENE;  Surgeon: Hessie Knows, MD;                Location: [facility] OR[facility];   Service: Orthopedics;                Laterality: Right; No date: TUBAL LIGATION  BMI    Body Mass Index:  42.05 kg/m      Reproductive/Obstetrics negative OB ROS                             Anesthesia Physical Anesthesia Plan  ASA: III  Anesthesia Plan: General ETT   Post-op Pain Management:    Induction: Intravenous  PONV Risk Score and Plan: 3 and Ondansetron, Dexamethasone, Propofol, Midazolam and Treatment may vary due to age  Airway Management Planned: Oral ETT  Additional Equipment:   Intra-op Plan:   Post-operative Plan: Extubation in OR  Informed Consent: I have reviewed the patients History and Physical, chart, labs and discussed the procedure including the risks, benefits and alternatives for the proposed anesthesia with the patient or authorized representative who has indicated his/her understanding and acceptance.   Dental Advisory Given  Plan Discussed with: Anesthesiologist, CRNA and Surgeon  Anesthesia Plan Comments: (Patient declines spinal and request [place]  Patient consented for risks of anesthesia including but not limited to:  - adverse reactions to medications - damage to teeth, lips or other oral mucosa - sore throat or hoarseness - Damage to heart, brain, lungs or loss of life  Patient voiced understanding.)        Anesthesia Quick Evaluation

## 2016-10-11 NOTE — NC FL2 (Signed)
[place] LEVEL OF CARE SCREENING TOOL     IDENTIFICATION  Patient Name: Wendy Johnson Birthdate: [birth year] Sex: female Admission Date (Current Location): 10/11/2016  [facility] [place] [facility] [place] Number:  Selena Lesser  ([id number]) Facility and Address:  [facility], [address] [place], [place] [address]      Provider Number: [id number]  Attending Physician Name and Address:  Hessie Knows, MD  Relative Name and Phone Number:       Current Level of Care: Hospital Recommended Level of Care: [place] Prior Approval Number:    Date Approved/Denied:   PASRR Number:  ([id number] )  Discharge Plan: SNF    Current Diagnoses: Patient Active Problem List   Diagnosis Date Noted  . Painful total knee replacement, right ([place]) 10/11/2016  . Rotator cuff tendinitis, right 07/29/2016  . Occipital neuralgia 10/12/2015  . Medication overuse headache 10/12/2015  . Instability of prosthetic knee ([place]) 09/22/2015  . Primary osteoarthritis of right hip 05/12/2015  . Sleep apnea 04/07/2015  . Primary osteoarthritis of left hip 12/16/2014  . Metabolic syndrome 67/34/1937  . Migraine without aura and without status migrainosus, not intractable 11/26/2014  . GERD without esophagitis 11/26/2014  . COPD, moderate ([place]) 11/26/2014  . Nocturnal oxygen desaturation 11/26/2014  . Supplemental oxygen dependent 11/26/2014  . Hearing loss 11/26/2014  . Chronic insomnia 11/26/2014  . History of hypertension 11/26/2014  . Dyslipidemia 11/26/2014  . Morbid obesity ([place]) 11/26/2014  . Diabetes mellitus type 2, diet-controlled ([place]) 11/26/2014  . Chronic constipation 11/26/2014  . Generalized anxiety disorder 11/11/2014  . DDD (degenerative disc disease), lumbar 11/11/2014  . Facet arthritis of lumbar region ([place]) 11/11/2014  . Primary osteoarthritis involving multiple joints 11/11/2014  . H/O hysterectomy for benign disease 10/20/2014  . History  of endometriosis 10/16/2014  . Low back derangement syndrome 09/30/2014  . Degenerative joint disease of both lower legs 09/30/2014  . Bipolar disorder ([place]) 09/30/2014    Orientation RESPIRATION BLADDER Height & Weight     Self, Time, Situation, Place  O2 (3 Liters Oxygen ) Continent Weight: 245 lb (111.1 kg) Height:  5\' 4"  (162.6 cm)  BEHAVIORAL SYMPTOMS/MOOD NEUROLOGICAL BOWEL NUTRITION STATUS   (none)  (none) Continent Diet (Diet: Clear Liquid to be advanced. )  AMBULATORY STATUS COMMUNICATION OF NEEDS Skin   Extensive Assist Verbally Surgical wounds, Wound Vac (Incision: Right Knee. Provena wound vac. )                       Personal Care Assistance Level of Assistance  Bathing, Feeding, Dressing Bathing Assistance: Limited assistance Feeding assistance: Independent Dressing Assistance: Limited assistance     Functional Limitations Info  Sight, Hearing, Speech Sight Info: Adequate Hearing Info: Adequate Speech Info: Adequate    SPECIAL CARE FACTORS FREQUENCY  PT (By licensed PT), OT (By licensed OT)     PT Frequency:  (5) OT Frequency:  (5)            Contractures      Additional Factors Info  Code Status, Allergies Code Status Info:  (Full Code. ) Allergies Info:  (Codeine, Imitrex Sumatriptan)           Current Medications (10/11/2016):  This is the current hospital active medication list Current Facility-Administered Medications  Medication Dose Route Frequency Provider Last Rate Last Dose  . 0.9 %  sodium chloride infusion   Intravenous Continuous Hessie Knows, MD 75 mL/hr at 10/11/16 1232    .  acetaminophen (TYLENOL) tablet 650 mg  650 mg Oral Q6H PRN Hessie Knows, MD       Or  . acetaminophen (TYLENOL) suppository 650 mg  650 mg Rectal Q6H PRN Hessie Knows, MD      . alum & mag hydroxide-simeth (MAALOX/MYLANTA) 200-200-20 MG/5ML suspension 30 mL  30 mL Oral Q4H PRN Hessie Knows, MD      . amphetamine-dextroamphetamine (ADDERALL) tablet 30  mg  30 mg Oral BID Hessie Knows, MD      . ARIPiprazole (ABILIFY) tablet 15 mg  15 mg Oral QHS Hessie Knows, MD      . bisacodyl (DULCOLAX) suppository 10 mg  10 mg Rectal Daily PRN Hessie Knows, MD      . busPIRone (BUSPAR) tablet 30 mg  30 mg Oral QPM Hessie Knows, MD      . ceFAZolin (ANCEF) IVPB 2g/100 mL premix  2 g Intravenous Q6H Hessie Knows, MD      . clonazePAM [facility]) tablet 1-2 mg  1-2 mg Oral TID PRN Hessie Knows, MD      . diphenhydrAMINE (BENADRYL) 12.5 MG/5ML elixir 12.5-25 mg  12.5-25 mg Oral Q4H PRN Hessie Knows, MD      . docusate sodium (COLACE) capsule 100 mg  100 mg Oral BID Hessie Knows, MD      . Derrill Memo ON 10/12/2016] enoxaparin (LOVENOX) injection 30 mg  30 mg Subcutaneous Q12H Hessie Knows, MD      . magnesium citrate solution 1 Bottle  1 Bottle Oral Once PRN Hessie Knows, MD      . magnesium hydroxide (MILK OF MAGNESIA) suspension 30 mL  30 mL Oral Daily PRN Hessie Knows, MD      . menthol-cetylpyridinium (CEPACOL) lozenge 3 mg  1 lozenge Oral PRN Hessie Knows, MD       Or  . phenol (CHLORASEPTIC) mouth spray 1 spray  1 spray Mouth/Throat PRN Hessie Knows, MD      . methocarbamol (ROBAXIN) tablet 500 mg  500 mg Oral Q6H PRN Hessie Knows, MD       Or  . methocarbamol (ROBAXIN) 500 mg in dextrose 5 % 50 mL IVPB  500 mg Intravenous Q6H PRN Hessie Knows, MD      . metoCLOPramide (REGLAN) tablet 5-10 mg  5-10 mg Oral Q8H PRN Hessie Knows, MD       Or  . metoCLOPramide (REGLAN) injection 5-10 mg  5-10 mg Intravenous Q8H PRN Hessie Knows, MD      . morphine 2 MG/ML injection 2 mg  2 mg Intravenous Q1H PRN Hessie Knows, MD      . ondansetron [facility]) tablet 4 mg  4 mg Oral Q6H PRN Hessie Knows, MD       Or  . ondansetron [facility]) injection 4 mg  4 mg Intravenous Q6H PRN Hessie Knows, MD      . oxyCODONE (Oxy IR/ROXICODONE) immediate release tablet 10 mg  10 mg Oral QID Hessie Knows, MD      . oxyCODONE (Oxy IR/ROXICODONE) immediate release tablet 5-10  mg  5-10 mg Oral Q3H PRN Hessie Knows, MD   5 mg at 10/11/16 1228  . [START ON 10/12/2016] pantoprazole (PROTONIX) EC tablet 40 mg  40 mg Oral Daily Hessie Knows, MD      . polyethylene glycol (MIRALAX / GLYCOLAX) packet 17 g  17 g Oral Daily Hessie Knows, MD      . sertraline (ZOLOFT) tablet 100 mg  100 mg Oral BID Hessie Knows, MD      . [  START ON 10/12/2016] tiotropium (SPIRIVA) inhalation capsule 18 mcg  18 mcg Inhalation Daily Hessie Knows, MD      . traZODone (DESYREL) tablet 300 mg  300 mg Oral QHS Hessie Knows, MD      . zolpidem [facility]) tablet 5 mg  5 mg Oral QHS PRN Hessie Knows, MD         Discharge Medications: Please see discharge summary for a list of discharge medications.  Relevant Imaging Results:  Relevant Lab Results:   Additional Information  (SSN: [id number])  Zitlali Primm, Veronia Beets, LCSW

## 2016-10-11 NOTE — Anesthesia Procedure Notes (Signed)
Procedure Name: Intubation Performed by: Demetrius Charity Pre-anesthesia Checklist: Patient identified, Patient being monitored, Timeout performed, Emergency Drugs available and Suction available Patient Re-evaluated:Patient Re-evaluated prior to inductionOxygen Delivery Method: Circle system utilized Preoxygenation: Pre-oxygenation with 100% oxygen Intubation Type: IV induction Ventilation: Mask ventilation without difficulty Laryngoscope Size: Mac and 3 Grade View: Grade II Tube type: Oral Tube size: 7.0 mm Number of attempts: 1 Airway Equipment and Method: Stylet Placement Confirmation: ETT inserted through vocal cords under direct vision,  positive ETCO2 and breath sounds checked- equal and bilateral Secured at: 22 cm Tube secured with: Tape Dental Injury: Teeth and Oropharynx as per pre-operative assessment

## 2016-10-11 NOTE — OR Nursing (Signed)
Explanted poly right knee

## 2016-10-12 ENCOUNTER — Encounter: Payer: Self-pay | Admitting: Orthopedic Surgery

## 2016-10-12 LAB — CBC
HCT: 39.3 % (ref 35.0–47.0)
HEMOGLOBIN: 13 g/dL (ref 12.0–16.0)
MCH: 27.6 pg (ref 26.0–34.0)
MCHC: 33.1 g/dL (ref 32.0–36.0)
MCV: 83.4 fL (ref 80.0–100.0)
Platelets: 238 10*3/uL (ref 150–440)
RBC: 4.71 MIL/uL (ref 3.80–5.20)
RDW: 16.1 % — AB (ref 11.5–14.5)
WBC: 10.9 10*3/uL (ref 3.6–11.0)

## 2016-10-12 LAB — BASIC METABOLIC PANEL
ANION GAP: 11 (ref 5–15)
BUN: 6 mg/dL (ref 6–20)
CALCIUM: 9.3 mg/dL (ref 8.9–10.3)
CO2: 26 mmol/L (ref 22–32)
CREATININE: 0.63 mg/dL (ref 0.44–1.00)
Chloride: 100 mmol/L — ABNORMAL LOW (ref 101–111)
GFR calc non Af Amer: >60 mL/min (ref >60)
Glucose, Bld: 228 mg/dL — ABNORMAL HIGH (ref 65–99)
Potassium: 3.4 mmol/L — ABNORMAL LOW (ref 3.5–5.1)
SODIUM: 137 mmol/L (ref 135–145)

## 2016-10-12 MED ORDER — OXYCODONE HCL 5 MG PO TABS: 5.0000 mg | ORAL_TABLET | ORAL | 0 refills | Status: DC | PRN

## 2016-10-12 MED ORDER — ENOXAPARIN SODIUM 40 MG/0.4ML ~~LOC~~ SOLN
40.0000 mg | Freq: Two times a day (BID) | SUBCUTANEOUS | Status: DC
Start: 2016-10-12 — End: 2016-10-12
  Administered 2016-10-12: 40 mg via SUBCUTANEOUS
  Filled 2016-10-12: qty 0.4

## 2016-10-12 MED ORDER — ENOXAPARIN SODIUM 40 MG/0.4ML ~~LOC~~ SOLN: 40.0000 mg | SUBCUTANEOUS | 0 refills | Status: DC

## 2016-10-12 MED ORDER — CLONIDINE HCL 0.1 MG PO TABS
0.1000 mg | ORAL_TABLET | Freq: Two times a day (BID) | ORAL | Status: DC | PRN
  Administered 2016-10-12: 0.1 mg via ORAL
  Filled 2016-10-12: qty 1

## 2016-10-12 MED ORDER — POTASSIUM CHLORIDE 20 MEQ PO PACK
20.0000 meq | PACK | Freq: Two times a day (BID) | ORAL | Status: DC
  Administered 2016-10-12: 20 meq via ORAL
  Filled 2016-10-12: qty 1

## 2016-10-12 NOTE — Progress Notes (Signed)
Physical Therapy Treatment Patient Details Name: Wendy Johnson MRN: [id number] DOB: [birth year] Today's Date: 10/12/2016    History of Present Illness 51 y/o female here post R total knee poly exchange.  She has had multiple replacements/revisions in this leg over the last few years.    PT Comments    [name] continues to make good progress with mobility.  She ambulated 200 ft with RW and supervision for safety.  She tolerated therapeutic exercises in sitting.  (-)14 deg R knee extension with AAROM.  81 deg R knee flexion with AAROM. Pt will benefit from continued skilled PT services in the [facility] setting to increase functional independence and safety.   Follow Up Recommendations  Home health PT     Equipment Recommendations  Rolling walker with 5" wheels    Recommendations for Other Services       Precautions / Restrictions Precautions Precautions: Fall;Knee;Other (comment) Precaution Comments: Wound vac Required Braces or Orthoses: Knee Immobilizer - Right Knee Immobilizer - Right: Other (comment) (Not specified in order) Restrictions Weight Bearing Restrictions: Yes RLE Weight Bearing: Weight bearing as tolerated    Mobility  Bed Mobility               General bed mobility comments: Pt sitting EOB at start of session and sitting in chair at end of session  Transfers Overall transfer level: Modified independent Equipment used: Rolling walker (2 wheeled)             General transfer comment: Pt demonstrates safe technique with sit<>stand without cues.  She rises slowly due to pain.  Ambulation/Gait Ambulation/Gait assistance: Supervision Ambulation Distance (Feet): 200 Feet Assistive device: Rolling walker (2 wheeled) Gait Pattern/deviations: Step-through pattern;Decreased stride length;Decreased stance time - right;Decreased step length - left;Decreased weight shift to right;Trunk flexed;Antalgic Gait velocity: decreased Gait velocity interpretation: Below  normal speed for age/gender General Gait Details: Cues provided to relax shoulders and for upright posture as pt has tendency to lean more heavily on RW when she begins to fatigue.     Stairs            Wheelchair Mobility    Modified Rankin (Stroke Patients Only)       Balance Overall balance assessment: Modified Independent Sitting-balance support: No upper extremity supported;Feet supported Sitting balance-Leahy Scale: Good     Standing balance support: During functional activity;Single extremity supported Standing balance-Leahy Scale: Fair Standing balance comment: Pt able to stand statically with one UE supported but requires RW for dynamic activity.                            Cognition Arousal/Alertness: Awake/alert Behavior During Therapy: WFL for tasks assessed/performed;Anxious Overall Cognitive Status: Within Functional Limits for tasks assessed                                        Exercises Total Joint Exercises Quad Sets: Strengthening;10 reps;Both;Seated Long Arc Quad: Strengthening;Right;Seated;5 reps;Limitations Long Arc Quad Limitations: Pt with significant extension lag.  LAQ performed through eccentric phase.  This PT assist in concentric phase to achieve extension. Knee Flexion: Right;Seated;AAROM;5 reps;Other (comment) (with 5 second holds) Goniometric ROM: -14 deg R knee extension with AAROM.  81 deg R knee flexion with AAROM.    General Comments        Pertinent Vitals/Pain Pain Assessment: 0-10 Pain Score: 8  Pain Location: right knee Pain Descriptors / Indicators: Aching Pain Intervention(s): Limited activity within patient's tolerance;Monitored during session;Repositioned    Home Living Family/patient expects to be discharged to:: Private residence Living Arrangements: Spouse/significant other Available Help at Discharge: Family;Friend(s);Available 24 hours/day;Available PRN/intermittently Type of Home:  House Home Access: Stairs to enter Entrance Stairs-Rails: Can reach both Home Layout: One level Home Equipment: Walker - 2 wheels;Cane - single point;Bedside commode;Shower seat;Adaptive equipment      Prior Function Level of Independence: Independent      Comments: Pt has struggled with R knee pain, but generally is able to be somewhat active   PT Goals (current goals can now be found in the care plan section) Acute Rehab PT Goals Patient Stated Goal: go home PT Goal Formulation: With patient Time For Goal Achievement: 10/25/16 Potential to Achieve Goals: Fair Progress towards PT goals: Progressing toward goals    Frequency    BID      PT Plan Current plan remains appropriate    Co-evaluation              AM-PAC PT "6 Clicks" Daily Activity  Outcome Measure  Difficulty turning over in bed (including adjusting bedclothes, sheets and blankets)?: A Little Difficulty moving from lying on back to sitting on the side of the bed? : A Little Difficulty sitting down on and standing up from a chair with arms (e.g., wheelchair, bedside commode, etc,.)?: A Little Help needed moving to and from a bed to chair (including a wheelchair)?: A Little Help needed walking in hospital room?: A Little Help needed climbing 3-5 steps with a railing? : A Little 6 Click Score: 18    End of Session Equipment Utilized During Treatment: Gait belt Activity Tolerance: Patient tolerated treatment well;Patient limited by pain Patient left: with chair alarm set;with call bell/phone within reach;in chair Nurse Communication: Mobility status PT Visit Diagnosis: Muscle weakness (generalized) (M62.81);Difficulty in walking, not elsewhere classified (R26.2)     Time: 6546-5035 PT Time Calculation (min) (ACUTE ONLY): 21 min  Charges:  $Gait Training: 8-22 mins                    G Codes:       Collie Siad PT, DPT 10/12/2016, 3:08 PM

## 2016-10-12 NOTE — Progress Notes (Signed)
Anticoagulation monitoring(Lovenox):  51 yo  female ordered Lovenox 30 mg Q12h  Filed Weights   10/11/16 0703  Weight: 245 lb (111.1 kg)   Body mass index is 42.05 kg/m.     Lab Results  Component Value Date   CREATININE 0.63 10/12/2016   CREATININE 0.50 10/11/2016   CREATININE 0.58 10/04/2016   Estimated Creatinine Clearance: 103.8 mL/min (by C-G formula based on SCr of 0.63 mg/dL). Hemoglobin & Hematocrit     Component Value Date/Time   HGB 13.0 10/12/2016 0537   HGB 11.6 (L) 05/08/2014 0555   HCT 39.3 10/12/2016 0537   HCT 38.7 02/03/2016 1116     Per Protocol for Patient with estCrcl >30 ml/min and BMI > 40, will transition to Lovenox 40 mg Q12h.

## 2016-10-12 NOTE — Care Management (Signed)
Per [facility], patient has has a walker in the past 5 years. She does not want to private pay for a walker. She said she can find once at home.

## 2016-10-12 NOTE — Evaluation (Signed)
Occupational Therapy Evaluation Patient Details Name: Wendy Johnson MRN: [id number] DOB: 1966-04-23 Today's Date: 10/12/2016    History of Present Illness 52 y/o female here post R total knee poly exchange.  She has had multiple replacements/revisions in this leg over the last few years.   Clinical Impression   Pt seen for brief OT evaluation this date. Pt with multiple knee surgeries before, presenting with pain (did not rate but stated that pain medications were starting to kick in), decreased ROM/strength in R knee. Pt with good home set up, supportive family/friends who will assist and previously independent with ADL/IADL and eager to return home today. Pt politely declined out of bed activity due to having recently just performed toileting to [facility] with assist from friend in room and using RW. Pt educated in [place] for LB dressing, pt verbalized understanding. Pt would benefit from skilled OT services to address impairments and functional deficits in self care tasks and home/routines modifications to maximize return to PLOF.     Follow Up Recommendations  No OT follow up    Equipment Recommendations  None recommended by OT    Recommendations for Other Services       Precautions / Restrictions Precautions Precautions: Fall;Knee Restrictions Weight Bearing Restrictions: Yes RLE Weight Bearing: Weight bearing as tolerated      Mobility Bed Mobility             General bed mobility comments: pt politely declined OOB this session due to fatigue, pain, after recently performing toileting task on [facility] in room with assist from friend  Transfers             General transfer comment: pt politely declined OOB this session due to fatigue, pain, after recently performing toileting task on [facility] in room with assist from friend    Balance Overall balance assessment: No apparent balance deficits (not formally assessed)             Standing balance comment: Able to maintain wide  stance without UE support                           ADL either performed or assessed with clinical judgement   ADL Overall ADL's : Needs assistance/impaired                                       General ADL Comments: pt generally supervision- min assist level for ADL, pt reported that she had just performed toileting task on [facility] prior to OT's arrival and politely declined OOB for session. Pt educated in use of AE for ADL and nail apron for front of walker to hold cell phone/other things and pt verbalized understanding     Vision Baseline Vision/History: No visual deficits Patient Visual Report: No change from baseline Vision Assessment?: No apparent visual deficits     Perception     Praxis      Pertinent Vitals/Pain Pain Assessment: 0-10 Pain Score:  (did not give a number but stated she just got pain meds and it was easing off) Pain Location: right knee Pain Descriptors / Indicators: Sharp;Burning Pain Intervention(s): Limited activity within patient's tolerance;Monitored during session;Premedicated before session     Hand Dominance Right   Extremity/Trunk Assessment Upper Extremity Assessment Upper Extremity Assessment: Overall WFL for tasks assessed   Lower Extremity Assessment Lower Extremity Assessment: Defer to PT  evaluation;RLE deficits/detail   Cervical / Trunk Assessment Cervical / Trunk Assessment: Normal   Communication Communication Communication: No difficulties   Cognition Arousal/Alertness: Awake/alert Behavior During Therapy: WFL for tasks assessed/performed Overall Cognitive Status: Within Functional Limits for tasks assessed                                     General Comments       Exercises   Shoulder Instructions      Home Living Family/patient expects to be discharged to:: Private residence Living Arrangements: Spouse/significant other Available Help at Discharge: Family;Friend(s);Available 24  hours/day;Available PRN/intermittently Type of Home: House Home Access: Stairs to enter [employer] of Steps: 3 Entrance Stairs-Rails: Can reach both Home Layout: One level     Bathroom Shower/Tub: Walk-in shower;Door   [employer] Toilet: Handicapped height Bathroom Accessibility: Yes How Accessible: Accessible via walker Home Equipment: [place] - 2 wheels;Cane - single point;Bedside commode;Shower seat;Adaptive equipment Adaptive Equipment: Reacher        Prior Functioning/Environment Level of Independence: Independent        Comments: Pt has struggled with R knee pain, but generally is able to be somewhat active        OT Problem List: Pain;Decreased range of motion      OT Treatment/Interventions: Self-care/ADL training;Therapeutic exercise;Therapeutic activities;DME and/or AE instruction;Patient/family education;Energy conservation    OT Goals(Current goals can be found in the care plan section) Acute Rehab OT Goals Patient Stated Goal: go home OT Goal Formulation: With patient Time For Goal Achievement: 10/26/16 Potential to Achieve Goals: Good  OT Frequency: Min 1X/week   Barriers to D/C:            Co-evaluation              AM-PAC PT "6 Clicks" Daily Activity     Outcome Measure Help from another person eating meals?: None Help from another person taking care of personal grooming?: None Help from another person toileting, which includes using toliet, bedpan, or urinal?: A Little Help from another person bathing (including washing, rinsing, drying)?: A Little Help from another person to put on and taking off regular upper body clothing?: None Help from another person to put on and taking off regular lower body clothing?: A Little 6 Click Score: 21   End of Session    Activity Tolerance: Patient tolerated treatment well Patient left: in bed;with call bell/phone within reach;with bed alarm set;with family/visitor present;with SCD's  reapplied;Other (comment) (polar care in place)  OT Visit Diagnosis: Other abnormalities of gait and mobility (R26.89)                Time: 6812-7517 OT Time Calculation (min): 10 min Charges:  OT General Charges $OT Visit: 1 Procedure OT Evaluation $OT Eval Low Complexity: 1 Procedure G-Codes:     Jeni Salles, MPH, MS, OTR/L ascom [phone] 10/12/16, 11:48 AM

## 2016-10-12 NOTE — Progress Notes (Signed)
Physical Therapy Treatment Patient Details Name: Wendy Johnson MRN: [id number] DOB: Sep 30, 1965 Today's Date: 10/12/2016    History of Present Illness 51 y/o female here post R total knee poly exchange.  She has had multiple replacements/revisions in this leg over the last few years.    PT Comments    Pt demonstrates excellent progress with physical therapy on this date. She is modified independent for bed mobility as well as transfers from bed, recliner, and commode.  She demonstrates excellent strength and stability during ambulation. Denies DOE and SaO2>95% on room air. HR increases to around 130 bpm. Pt able to complete a full lap around RN station as well as perform stair training. She ascends/descends 4 steps with step-to pattern. Good safety and stability noted. Care manager notified that pt has met all PT goals for discharge. She is ready to discharge home with [facility] PT and family assist when medically stable. Pt will benefit from skilled PT services to address deficits in strength, balance, and mobility in order to return to full function at home.    Follow Up Recommendations  Home health PT     Equipment Recommendations  Rolling walker with 5" wheels    Recommendations for Other Services       Precautions / Restrictions Precautions Precautions: Fall;Knee Restrictions Weight Bearing Restrictions: Yes RLE Weight Bearing: Weight bearing as tolerated    Mobility  Bed Mobility Overal bed mobility: Modified Independent             General bed mobility comments: Pt able to get herself to EOB w/o assist, needed L LE and UEs to get R LE to EOB. HOB elevated and bed rails utilized  Transfers Overall transfer level: Modified independent Equipment used: Rolling walker (2 wheeled)             General transfer comment: Pt able to rise to standing without external assist. Demonstrates improved hand placement today, Good stability  in standing with bilateral UE support on  walker. Able to maintain balance in wide stance withotu UE support and decreased weight shift to LLE.  Ambulation/Gait Ambulation/Gait assistance: Supervision Ambulation Distance (Feet): 250 Feet Assistive device: Rolling walker (2 wheeled)       General Gait Details: Pt demonstrates excellent strength during ambulation. She requires cues to push walker insteady of lifting. Good stability. Denies DOE and SaO2>95% on room air. HR increases to around 130 bpm. Pt able to complete a full lpa around RN station   Stairs Stairs: Yes   Stair Management: Two rails;Step to pattern Number of Stairs: 4 General stair comments: Pt provided cues for proper sequencing during stair training. She demonstrates good stabilty and safety awareness. Cues provided for turning once at top of stairs. Pt is slightly impulsive but overall is safe  Wheelchair Mobility    Modified Rankin (Stroke Patients Only)       Balance Overall balance assessment: Modified Independent             Standing balance comment: Able to maintain wide stance without UE support                            Cognition Arousal/Alertness: Awake/alert Behavior During Therapy: WFL for tasks assessed/performed;Anxious Overall Cognitive Status: Within Functional Limits for tasks assessed  Exercises Total Joint Exercises Ankle Circles/Pumps: Strengthening;10 reps;Both;Supine;Other (comment) (Heel raises x 10 bilateral in sitting) Quad Sets: Strengthening;10 reps;Both;Supine Gluteal Sets: Strengthening;10 reps;Both;Supine Towel Squeeze: Strengthening;Both;10 reps;Supine Hip ABduction/ADduction: Strengthening;10 reps;Supine;Right Straight Leg Raises: AAROM;10 reps;Right;Supine Long Arc Quad: Strengthening;Right;10 reps;Seated Knee Flexion: Strengthening;Right;10 reps;Seated Goniometric ROM: 13 -78 AAROM, pain limited Marching in Standing: Strengthening;10  reps;Both;Seated    General Comments        Pertinent Vitals/Pain Pain Assessment: 0-10 Pain Score: 10-Worst pain ever Pain Location: right knee Pain Descriptors / Indicators: Sharp;Burning Pain Intervention(s): Monitored during session    Home Living                      Prior Function            PT Goals (current goals can now be found in the care plan section) Acute Rehab PT Goals Patient Stated Goal: go home PT Goal Formulation: With patient Time For Goal Achievement: 10/25/16 Potential to Achieve Goals: Fair Progress towards PT goals: Progressing toward goals    Frequency    BID      PT Plan Current plan remains appropriate    Co-evaluation              AM-PAC PT "6 Clicks" Daily Activity  Outcome Measure  Difficulty turning over in bed (including adjusting bedclothes, sheets and blankets)?: A Little Difficulty moving from lying on back to sitting on the side of the bed? : A Little Difficulty sitting down on and standing up from a chair with arms (e.g., wheelchair, bedside commode, etc,.)?: A Little Help needed moving to and from a bed to chair (including a wheelchair)?: None Help needed walking in hospital room?: None Help needed climbing 3-5 steps with a railing? : A Little 6 Click Score: 20    End of Session Equipment Utilized During Treatment: Gait belt Activity Tolerance: Patient tolerated treatment well;Patient limited by pain Patient left: with chair alarm set;with call bell/phone within reach Nurse Communication: Other (comment) (Care manager notified that PT goals met) PT Visit Diagnosis: Muscle weakness (generalized) (M62.81);Difficulty in walking, not elsewhere classified (R26.2)     Time: 3817-7116 PT Time Calculation (min) (ACUTE ONLY): 44 min  Charges:  $Gait Training: 8-22 mins $Therapeutic Exercise: 8-22 mins $Therapeutic Activity: 8-22 mins                    G Codes:       Lyndel Safe Calla Wedekind PT, DPT      Lamarion Mcevers 10/12/2016, 10:55 AM

## 2016-10-12 NOTE — Progress Notes (Signed)
Pt has participated in therapies today. Pain controlled with oral medications. Pt on klonopin for anxiety prn. Per staff pt was seen taking "some pills from a bottle she brought from home" when asked, pt reported she had some klonipin and felt like she needed one. I explained to pt that I had given her the prescribed dose this am and if she required another dose to please ask the nurse or the MD and do NOT take home supply while in hospital. Pt agreed with this .

## 2016-10-12 NOTE — Care Management (Signed)
Lovenox $1.25 per [facility] [phone]. Patient informed and agrees. She states that she has a front-wheeled walker available and will have her boyfriend bring it to the hospital for PT to adjust if needed. She states RNCM will not need to assist with walker.

## 2016-10-12 NOTE — Discharge Instructions (Signed)

## 2016-10-12 NOTE — Progress Notes (Signed)
Discharge Instructions reviewed with patient including the need to call MD and schedule follow up appointment in the morning as office is currently closed. Prescription given to patient for Oxycodone and Lovenox.  Patient reminded not drink alcohol, operate a vehicle or heavy machinery while under the influence of sedative medication. Patient verbalized understanding of discharge instructions. Patient reminded to take all belongings with her, patient escorted to vehicle bu nurse tech via wheelchair. Patient's brother to drive her home.

## 2016-10-12 NOTE — Care Management Note (Addendum)
Case Management Note  Patient Details  Name: Wendy Johnson MRN: [id number] Date of Birth: December 21, 1965  Subjective/Objective:  POD # 1  Right total knee revision. Met with patient at bedside to discuss discharge planning. [facility] requesting [facility] for [facility] PT. Referral to [facility] with [facility].She had a walker and has lost it. She has a bsc. Ordered a walker  from [facility]. [facility] : [facility]   [phone]. Called Lovenox 40 mg # 14 no refills. PCP is Dr. Ancil Boozer.                Action/Plan: [facility] for HHPT and walker. Lovenox called in.    Expected Discharge Date:  10/13/16               Expected Discharge Plan:  [place]  In-House Referral:     Discharge planning Services  CM Consult  Post Acute Care Choice:  Durable Medical Equipment, Home Health Choice offered to:  Patient  DME Arranged:  Walker rolling DME Agency:  [place]:  PT [facility] Agency:  [place]  Status of Service:  In process, will continue to follow  If discussed at Long Length of Stay Meetings, dates discussed:    Additional Comments:  Jolly Mango, RN 10/12/2016, 11:27 AM

## 2016-10-12 NOTE — Progress Notes (Signed)
Dr. Marry Guan paged and made aware of patient's vitals prior to discharge; Blood pressure rechecked manually and reported to MD; per MD okay to discharge patient.

## 2016-10-12 NOTE — Progress Notes (Signed)
   Subjective: 1 Day Post-Op Procedure(s) (LRB): TOTAL KNEE REVISION (Right) Patient reports pain as moderate.   Patient is well, and has had no acute complaints or problems Denies any CP, SOB, ABD pain. We will continue therapy today.  Plan is to go Home after hospital stay.  Objective: Vital signs in last 24 hours: Temp:  [97 F (36.1 C)-98.2 F (36.8 C)] 98.1 F (36.7 C) (06/06 0803) Pulse Rate:  [71-119] 71 (06/06 0803) Resp:  [15-20] 20 (06/06 0803) BP: (119-179)/(68-97) 179/92 (06/06 0803) SpO2:  [90 %-96 %] 94 % (06/06 0803)  Intake/Output from previous day: 06/05 0701 - 06/06 0700 In: 2755 [P.O.:100; I.V.:2455; IV Piggyback:200] Out: 7380 [Urine:7280; Blood:100] Intake/Output this shift: No intake/output data recorded.   Recent Labs  10/11/16 1223 10/12/16 0537  HGB 12.9 13.0    Recent Labs  10/11/16 1223 10/12/16 0537  WBC 8.6 10.9  RBC 4.62 4.71  HCT 37.9 39.3  PLT 202 238    Recent Labs  10/11/16 1223 10/12/16 0537  NA  --  137  K  --  3.4*  CL  --  100*  CO2  --  26  BUN  --  6  CREATININE 0.50 0.63  GLUCOSE  --  228*  CALCIUM  --  9.3   No results for input(s): LABPT, INR in the last 72 hours.  EXAM General - Patient is Alert, Appropriate and Oriented Extremity - Neurovascular intact Sensation intact distally Intact pulses distally Dorsiflexion/Plantar flexion intact No cellulitis present Compartment soft Dressing - dressing C/D/I and wound vac intact, minimal drainage Motor Function - intact, moving foot and toes well on exam.   Past Medical History:  Diagnosis Date  . ADHD (attention deficit hyperactivity disorder)   . Anxiety   . AR (allergic rhinitis)   . Arthritis   . Benign hypertension   . Chronic back pain   . Chronic constipation   . Chronic insomnia   . COPD (chronic obstructive pulmonary disease) ([place])   . Deaf   . Decreased dorsalis pedis pulse   . Depression   . Dyslipidemia   . Fatty liver   . GERD  (gastroesophageal reflux disease)   . Hepatomegaly   . High cholesterol   . Hot flashes   . Migraine with aura   . OCD (obsessive compulsive disorder)   . Plantar warts   . Severe obesity ([place])   . Shortness of breath dyspnea   . Sleep apnea    C-PAP  . Tobacco use   . Trochanteric bursitis of right hip     Assessment/Plan:   1 Day Post-Op Procedure(s) (LRB): TOTAL KNEE REVISION (Right) Active Problems:   Painful total knee replacement, right (HCC)    Hypokalemia   Estimated body mass index is 42.05 kg/m as calculated from the following:   Height as of this encounter: 5\' 4"  (1.626 m).   Weight as of this encounter: 111.1 kg (245 lb). [facility] diet Up with therapy  Needs BM Hypokalemia - recheck k in the am, Klor kon 20 meq BID today Recheck labs in the am CM to assist with discharge  DVT Prophylaxis - Lovenox and Foot Pumps Weight-Bearing as tolerated to right leg   T. Rachelle Hora, PA-C [place] 10/12/2016, 8:20 AM

## 2016-10-12 NOTE — Progress Notes (Signed)
Clinical Social Worker (CSW) received SNF consult. PT is recommending home health. RN case manger aware of above. Please reconsult if future social work needs arise. CSW signing off.   Logon [place], LCSW [phone]  

## 2016-10-12 NOTE — Discharge Summary (Signed)
Physician Discharge Summary  Patient ID: Wendy Johnson MRN: [id number] DOB/AGE: [birth year] 51 y.o.  Admit date: 10/11/2016 Discharge date: 10/12/2016  Admission Diagnoses:  ACUTE PAIN OF RIGHT KNEE   Discharge Diagnoses: Patient Active Problem List   Diagnosis Date Noted  . Painful total knee replacement, right ([place]) 10/11/2016  . Rotator cuff tendinitis, right 07/29/2016  . Occipital neuralgia 10/12/2015  . Medication overuse headache 10/12/2015  . Instability of prosthetic knee ([place]) 09/22/2015  . Primary osteoarthritis of right hip 05/12/2015  . Sleep apnea 04/07/2015  . Primary osteoarthritis of left hip 12/16/2014  . Metabolic syndrome 46/65/9935  . Migraine without aura and without status migrainosus, not intractable 11/26/2014  . GERD without esophagitis 11/26/2014  . COPD, moderate ([place]) 11/26/2014  . Nocturnal oxygen desaturation 11/26/2014  . Supplemental oxygen dependent 11/26/2014  . Hearing loss 11/26/2014  . Chronic insomnia 11/26/2014  . History of hypertension 11/26/2014  . Dyslipidemia 11/26/2014  . Morbid obesity ([place]) 11/26/2014  . Diabetes mellitus type 2, diet-controlled ([place]) 11/26/2014  . Chronic constipation 11/26/2014  . Generalized anxiety disorder 11/11/2014  . DDD (degenerative disc disease), lumbar 11/11/2014  . Facet arthritis of lumbar region ([place]) 11/11/2014  . Primary osteoarthritis involving multiple joints 11/11/2014  . H/O hysterectomy for benign disease 10/20/2014  . History of endometriosis 10/16/2014  . Low back derangement syndrome 09/30/2014  . Degenerative joint disease of both lower legs 09/30/2014  . Bipolar disorder ([place]) 09/30/2014    Past Medical History:  Diagnosis Date  . ADHD (attention deficit hyperactivity disorder)   . Anxiety   . AR (allergic rhinitis)   . Arthritis   . Benign hypertension   . Chronic back pain   . Chronic constipation   . Chronic insomnia   . COPD (chronic obstructive pulmonary disease) ([place])    . Deaf   . Decreased dorsalis pedis pulse   . Depression   . Dyslipidemia   . Fatty liver   . GERD (gastroesophageal reflux disease)   . Hepatomegaly   . High cholesterol   . Hot flashes   . Migraine with aura   . OCD (obsessive compulsive disorder)   . Plantar warts   . Severe obesity ([place])   . Shortness of breath dyspnea   . Sleep apnea    C-PAP  . Tobacco use   . Trochanteric bursitis of right hip      Transfusion: none   Consultants (if any):   Discharged Condition: Improved  Hospital Course: Wendy Johnson is an 51 y.o. female who was admitted 10/11/2016 with a diagnosis of <principal problem not specified> and went to the operating room on 10/11/2016 and underwent the above named procedures.    Surgeries: Procedure(s): TOTAL KNEE REVISION on 10/11/2016 Patient tolerated the surgery well. Taken to PACU where she was stabilized and then transferred to the orthopedic floor.  Started on Lovenox 40 q 12 hrs. Foot pumps applied bilaterally at 80 mm. Heels elevated on bed with rolled towels. No evidence of DVT. Negative [place]. Physical therapy started on day #1 for gait training and transfer. OT started day #1 for ADL and assisted devices.  Patient's foley was d/c on day #1. Patient's IV was d/c on day #1.  On post op day #1 patient was stable and ready for discharge to home with HHPT.  Implants: DePuy Sigma 3 20 mm TC3  She was given perioperative antibiotics:  Anti-infectives    Start     Dose/Rate Route Frequency Ordered Stop  10/11/16 1500  ceFAZolin (ANCEF) IVPB 2g/100 mL premix     2 g 200 mL/hr over 30 Minutes Intravenous Every 6 hours 10/11/16 1159 10/11/16 2039   10/11/16 0948  vancomycin (VANCOCIN) powder  Status:  Discontinued       As needed 10/11/16 0948 10/11/16 1023   10/11/16 0554  ceFAZolin (ANCEF) 2-4 GM/100ML-% IVPB    Comments:  Ronnell Freshwater: cabinet override      10/11/16 0554 10/11/16 1759   10/11/16 0045  ceFAZolin (ANCEF) IVPB 2g/100 mL  premix     2 g 200 mL/hr over 30 Minutes Intravenous  Once 10/11/16 0034 10/11/16 0926    .  She was given sequential compression devices, early ambulation, and Lovenox for DVT prophylaxis.  She benefited maximally from the hospital stay and there were no complications.    Recent vital signs:  Vitals:   10/12/16 0803 10/12/16 1536  BP: (!) 179/92 (!) 171/107  Pulse: 71 96  Resp: 20   Temp: 98.1 F (36.7 C) 97.9 F (36.6 C)    Recent laboratory studies:  Lab Results  Component Value Date   HGB 13.0 10/12/2016   HGB 12.9 10/11/2016   HGB 13.7 10/04/2016   Lab Results  Component Value Date   WBC 10.9 10/12/2016   PLT 238 10/12/2016   Lab Results  Component Value Date   INR 0.97 10/04/2016   Lab Results  Component Value Date   NA 137 10/12/2016   K 3.4 (L) 10/12/2016   CL 100 (L) 10/12/2016   CO2 26 10/12/2016   BUN 6 10/12/2016   CREATININE 0.63 10/12/2016   GLUCOSE 228 (H) 10/12/2016    Discharge Medications:   Allergies as of 10/12/2016      Reactions   Codeine Nausea Only   Imitrex [sumatriptan] Other (See Comments)   Chest pain      Medication List    TAKE these medications   amphetamine-dextroamphetamine 30 MG tablet Commonly known as:  ADDERALL Take 30 mg by mouth 2 (two) times daily.   ARIPiprazole 15 MG tablet Commonly known as:  ABILIFY Take 15 mg by mouth at bedtime.   busPIRone 30 MG tablet Commonly known as:  BUSPAR Take 30 mg by mouth every evening.   clonazePAM 1 MG tablet Commonly known as:  KLONOPIN Take 1-2 mg by mouth 3 (three) times daily as needed for anxiety (takes 2 every  morning and then 1-2 tablets as needed during the day for anxiety).   cyclobenzaprine 10 MG tablet Commonly known as:  FLEXERIL Take 1 tablet (10 mg total) by mouth 2 (two) times daily. What changed:  how much to take  when to take this  reasons to take this   dexlansoprazole 60 MG capsule Commonly known as:  DEXILANT Take 1 capsule (60 mg  total) by mouth daily.   diclofenac sodium 1 % Gel Commonly known as:  VOLTAREN Apply 4 g topically 4 (four) times daily.   enoxaparin 40 MG/0.4ML injection Commonly known as:  LOVENOX Inject 0.4 mLs (40 mg total) into the skin daily.   hydrOXYzine 25 MG tablet Commonly known as:  ATARAX/VISTARIL Take 25 mg by mouth daily as needed for anxiety or itching (hives).   Oxycodone HCl 10 MG Tabs Take 1 tablet (10 mg total) by mouth QID. What changed:  Another medication with the same name was added. Make sure you understand how and when to take each.   oxyCODONE 5 MG immediate release tablet Commonly known as:  Oxy IR/ROXICODONE Take 1-2 tablets (5-10 mg total) by mouth every 3 (three) hours as needed for breakthrough pain. What changed:  You were already taking a medication with the same name, and this prescription was added. Make sure you understand how and when to take each.   polyethylene glycol packet Commonly known as:  MIRALAX / GLYCOLAX Take 17 g by mouth daily. What changed:  Another medication with the same name was removed. Continue taking this medication, and follow the directions you see here.   sertraline 100 MG tablet Commonly known as:  ZOLOFT Take 100 mg by mouth 2 (two) times daily.   tiotropium 18 MCG inhalation capsule Commonly known as:  SPIRIVA Place 1 capsule (18 mcg total) into inhaler and inhale daily.   traZODone 150 MG tablet Commonly known as:  DESYREL Take 300 mg by mouth at bedtime.       Diagnostic Studies: No results found.  Disposition: 01-Home or Self Care    Follow-up Information    Hessie Knows, MD Follow up in 2 week(s).   Specialty:  Orthopedic Surgery Contact information: [place] [address] [phone]            Signed: Feliberto Gottron 10/12/2016, 4:52 PM

## 2016-10-22 DIAGNOSIS — J449 Chronic obstructive pulmonary disease, unspecified: Secondary | ICD-10-CM | POA: Diagnosis not present

## 2016-10-22 DIAGNOSIS — G8929 Other chronic pain: Secondary | ICD-10-CM | POA: Diagnosis not present

## 2016-10-22 DIAGNOSIS — E119 Type 2 diabetes mellitus without complications: Secondary | ICD-10-CM | POA: Diagnosis not present

## 2016-10-22 DIAGNOSIS — M5136 Other intervertebral disc degeneration, lumbar region: Secondary | ICD-10-CM | POA: Diagnosis not present

## 2016-10-22 DIAGNOSIS — M160 Bilateral primary osteoarthritis of hip: Secondary | ICD-10-CM | POA: Diagnosis not present

## 2016-10-22 DIAGNOSIS — Z4733 Aftercare following explantation of knee joint prosthesis: Secondary | ICD-10-CM | POA: Diagnosis not present

## 2016-10-25 DIAGNOSIS — M160 Bilateral primary osteoarthritis of hip: Secondary | ICD-10-CM | POA: Diagnosis not present

## 2016-10-25 DIAGNOSIS — J449 Chronic obstructive pulmonary disease, unspecified: Secondary | ICD-10-CM | POA: Diagnosis not present

## 2016-10-25 DIAGNOSIS — E119 Type 2 diabetes mellitus without complications: Secondary | ICD-10-CM | POA: Diagnosis not present

## 2016-10-25 DIAGNOSIS — G8929 Other chronic pain: Secondary | ICD-10-CM | POA: Diagnosis not present

## 2016-10-25 DIAGNOSIS — Z4733 Aftercare following explantation of knee joint prosthesis: Secondary | ICD-10-CM | POA: Diagnosis not present

## 2016-10-25 DIAGNOSIS — M5136 Other intervertebral disc degeneration, lumbar region: Secondary | ICD-10-CM | POA: Diagnosis not present

## 2016-10-26 DIAGNOSIS — M25561 Pain in right knee: Secondary | ICD-10-CM | POA: Diagnosis not present

## 2016-10-26 DIAGNOSIS — R262 Difficulty in walking, not elsewhere classified: Secondary | ICD-10-CM | POA: Diagnosis not present

## 2016-10-26 DIAGNOSIS — M6281 Muscle weakness (generalized): Secondary | ICD-10-CM | POA: Diagnosis not present

## 2016-10-26 DIAGNOSIS — G8929 Other chronic pain: Secondary | ICD-10-CM | POA: Diagnosis not present

## 2016-10-28 ENCOUNTER — Ambulatory Visit: Payer: Medicare Other | Admitting: Family Medicine

## 2016-11-03 ENCOUNTER — Encounter: Payer: Self-pay | Admitting: Anesthesiology

## 2016-11-03 ENCOUNTER — Ambulatory Visit: Payer: Medicare Other | Attending: Anesthesiology | Admitting: Anesthesiology

## 2016-11-03 VITALS — BP 146/102 | HR 102 | Temp 97.8°F | Resp 18 | Ht 64.0 in | Wt 235.0 lb

## 2016-11-03 DIAGNOSIS — M5136 Other intervertebral disc degeneration, lumbar region: Secondary | ICD-10-CM | POA: Insufficient documentation

## 2016-11-03 DIAGNOSIS — Z833 Family history of diabetes mellitus: Secondary | ICD-10-CM | POA: Diagnosis not present

## 2016-11-03 DIAGNOSIS — Z79891 Long term (current) use of opiate analgesic: Secondary | ICD-10-CM | POA: Insufficient documentation

## 2016-11-03 DIAGNOSIS — Z842 Family history of other diseases of the genitourinary system: Secondary | ICD-10-CM

## 2016-11-03 DIAGNOSIS — E785 Hyperlipidemia, unspecified: Secondary | ICD-10-CM | POA: Diagnosis not present

## 2016-11-03 DIAGNOSIS — Z8249 Family history of ischemic heart disease and other diseases of the circulatory system: Secondary | ICD-10-CM | POA: Diagnosis not present

## 2016-11-03 DIAGNOSIS — F17210 Nicotine dependence, cigarettes, uncomplicated: Secondary | ICD-10-CM | POA: Insufficient documentation

## 2016-11-03 DIAGNOSIS — G8929 Other chronic pain: Secondary | ICD-10-CM | POA: Diagnosis not present

## 2016-11-03 DIAGNOSIS — Z87440 Personal history of urinary (tract) infections: Secondary | ICD-10-CM | POA: Insufficient documentation

## 2016-11-03 DIAGNOSIS — M545 Low back pain, unspecified: Secondary | ICD-10-CM

## 2016-11-03 DIAGNOSIS — Z7901 Long term (current) use of anticoagulants: Secondary | ICD-10-CM | POA: Insufficient documentation

## 2016-11-03 DIAGNOSIS — M150 Primary generalized (osteo)arthritis: Secondary | ICD-10-CM | POA: Diagnosis not present

## 2016-11-03 DIAGNOSIS — F418 Other specified anxiety disorders: Secondary | ICD-10-CM | POA: Insufficient documentation

## 2016-11-03 DIAGNOSIS — M961 Postlaminectomy syndrome, not elsewhere classified: Secondary | ICD-10-CM

## 2016-11-03 DIAGNOSIS — K219 Gastro-esophageal reflux disease without esophagitis: Secondary | ICD-10-CM | POA: Insufficient documentation

## 2016-11-03 DIAGNOSIS — Z811 Family history of alcohol abuse and dependence: Secondary | ICD-10-CM | POA: Insufficient documentation

## 2016-11-03 DIAGNOSIS — F1190 Opioid use, unspecified, uncomplicated: Secondary | ICD-10-CM | POA: Diagnosis not present

## 2016-11-03 DIAGNOSIS — G4730 Sleep apnea, unspecified: Secondary | ICD-10-CM | POA: Insufficient documentation

## 2016-11-03 DIAGNOSIS — Z8489 Family history of other specified conditions: Secondary | ICD-10-CM | POA: Diagnosis not present

## 2016-11-03 DIAGNOSIS — E119 Type 2 diabetes mellitus without complications: Secondary | ICD-10-CM | POA: Insufficient documentation

## 2016-11-03 DIAGNOSIS — M160 Bilateral primary osteoarthritis of hip: Secondary | ICD-10-CM | POA: Diagnosis not present

## 2016-11-03 DIAGNOSIS — I10 Essential (primary) hypertension: Secondary | ICD-10-CM | POA: Diagnosis not present

## 2016-11-03 DIAGNOSIS — M542 Cervicalgia: Secondary | ICD-10-CM | POA: Diagnosis not present

## 2016-11-03 DIAGNOSIS — J449 Chronic obstructive pulmonary disease, unspecified: Secondary | ICD-10-CM | POA: Insufficient documentation

## 2016-11-03 DIAGNOSIS — M170 Bilateral primary osteoarthritis of knee: Secondary | ICD-10-CM | POA: Diagnosis not present

## 2016-11-03 DIAGNOSIS — Z90710 Acquired absence of both cervix and uterus: Secondary | ICD-10-CM | POA: Diagnosis not present

## 2016-11-03 DIAGNOSIS — M1990 Unspecified osteoarthritis, unspecified site: Secondary | ICD-10-CM | POA: Insufficient documentation

## 2016-11-03 DIAGNOSIS — Z6841 Body Mass Index (BMI) 40.0 and over, adult: Secondary | ICD-10-CM | POA: Diagnosis not present

## 2016-11-03 DIAGNOSIS — Z9981 Dependence on supplemental oxygen: Secondary | ICD-10-CM | POA: Diagnosis not present

## 2016-11-03 DIAGNOSIS — Z8742 Personal history of other diseases of the female genital tract: Secondary | ICD-10-CM | POA: Insufficient documentation

## 2016-11-03 DIAGNOSIS — Z96651 Presence of right artificial knee joint: Secondary | ICD-10-CM | POA: Insufficient documentation

## 2016-11-03 DIAGNOSIS — G8918 Other acute postprocedural pain: Secondary | ICD-10-CM

## 2016-11-03 DIAGNOSIS — Z79899 Other long term (current) drug therapy: Secondary | ICD-10-CM | POA: Insufficient documentation

## 2016-11-03 DIAGNOSIS — F319 Bipolar disorder, unspecified: Secondary | ICD-10-CM | POA: Diagnosis not present

## 2016-11-03 DIAGNOSIS — M8949 Other hypertrophic osteoarthropathy, multiple sites: Secondary | ICD-10-CM

## 2016-11-03 DIAGNOSIS — F5104 Psychophysiologic insomnia: Secondary | ICD-10-CM | POA: Insufficient documentation

## 2016-11-03 DIAGNOSIS — M159 Polyosteoarthritis, unspecified: Secondary | ICD-10-CM

## 2016-11-03 MED ORDER — OXYCODONE HCL 10 MG PO TABS
10.0000 mg | ORAL_TABLET | Freq: Four times a day (QID) | ORAL | 0 refills | Status: DC
Start: 2016-11-03 — End: 2017-01-10

## 2016-11-03 MED ORDER — OXYCODONE HCL 10 MG PO TABS: 10.0000 mg | ORAL_TABLET | Freq: Four times a day (QID) | ORAL | 0 refills | Status: DC

## 2016-11-03 NOTE — Progress Notes (Signed)
Nursing Pain Medication Assessment:  Safety precautions to be maintained throughout the outpatient stay will include: orient to surroundings, keep bed in low position, maintain call bell within reach at all times, provide assistance with transfer out of bed and ambulation.  Medication Inspection Compliance: Pill count conducted under aseptic conditions, in front of the patient. Neither the pills nor the bottle was removed from the patient's sight at any time. Once count was completed pills were immediately returned to the patient in their original bottle.  Medication: Oxycodone IR Pill/Patch Count: 28 of 120 pills remain Pill/Patch Appearance: Markings consistent with prescribed medication Bottle Appearance: Standard pharmacy container. Clearly labeled. Filled Date: 06 / 06 / 2018 Last Medication intake:  Today

## 2016-11-04 NOTE — Progress Notes (Signed)
Subjective:    Patient ID: Wendy Johnson, female    DOB: 1965-12-11, 51 y.o.   MRN: [id number]    Chief complaint: Chronic low back pain. Chronic bilateral lower extremity pain and diffuse body pain  HPI:Wendy Johnson Was last seen in April and continues to have chronic low back pain. She also had recent surgery for a revision of her right knee hardware. This was done by Dr. Rudene Johnson. Otherwise she is in her usual state of health but has had a recent upper rest for infection. She also reports a recent UTI. She has been on some antibiotics for both of these. She reports that she's taking her medications as prescribed with no diverting or illicit use. She has been on oxycodone 10 mg tablets 3 times a day on average. These seem to work well for her based on her narcotic assessment sheet and she reports that she has had good lifestyle improvement with them. She has run out early secondary to increased pain following her recent knee revision. She did receive some opioids from the orthopedic doctors following this procedure. This use was evaluated via the [facility] practitioner database and seems reasonable.    BP (!) 146/102   Pulse (!) 102   Temp 97.8 F (36.6 C) (Oral)   Resp 18   Ht 5\' 4"  (1.626 m)   Wt 235 lb (106.6 kg)   LMP 09/26/2014 Comment: Total  SpO2 98%   BMI 40.34 kg/m     Current Outpatient Prescriptions:  .  amphetamine-dextroamphetamine (ADDERALL) 30 MG tablet, Take 30 mg by mouth 2 (two) times daily., Disp: , Rfl:  .  ARIPiprazole (ABILIFY) 15 MG tablet, Take 15 mg by mouth at bedtime., Disp: , Rfl:  .  busPIRone (BUSPAR) 30 MG tablet, Take 30 mg by mouth every evening. , Disp: , Rfl:  .  clonazePAM (KLONOPIN) 1 MG tablet, Take 1-2 mg by mouth 3 (three) times daily as needed for anxiety (takes 2 every  morning and then 1-2 tablets as needed during the day for anxiety). , Disp: , Rfl:  .  cyclobenzaprine (FLEXERIL) 10 MG tablet, Take 1 tablet (10 mg total) by mouth 2 (two) times  daily. (Patient taking differently: Take 20 mg by mouth daily as needed for muscle spasms. ), Disp: 60 tablet, Rfl: 0 .  dexlansoprazole (DEXILANT) 60 MG capsule, Take 1 capsule (60 mg total) by mouth daily., Disp: 30 capsule, Rfl: 5 .  diclofenac sodium (VOLTAREN) 1 % GEL, Apply 4 g topically 4 (four) times daily., Disp: 1 Tube, Rfl: 1 .  hydrOXYzine (ATARAX/VISTARIL) 25 MG tablet, Take 25 mg by mouth daily as needed for anxiety or itching (hives). , Disp: , Rfl:  .  Oxycodone HCl 10 MG TABS, Take 1 tablet (10 mg total) by mouth QID., Disp: 120 tablet, Rfl: 0 .  polyethylene glycol (MIRALAX / GLYCOLAX) packet, Take 17 g by mouth daily., Disp: , Rfl:  .  sertraline (ZOLOFT) 100 MG tablet, Take 100 mg by mouth 2 (two) times daily. , Disp: , Rfl:  .  tiotropium (SPIRIVA) 18 MCG inhalation capsule, Place 1 capsule (18 mcg total) into inhaler and inhale daily., Disp: 30 capsule, Rfl: 5 .  traZODone (DESYREL) 150 MG tablet, Take 300 mg by mouth at bedtime. , Disp: , Rfl:  .  enoxaparin (LOVENOX) 40 MG/0.4ML injection, Inject 0.4 mLs (40 mg total) into the skin daily., Disp: 14 Syringe, Rfl: 0   Patient Active Problem List   Diagnosis Date  Noted  . Painful total knee replacement, right ([place]) 10/11/2016  . Rotator cuff tendinitis, right 07/29/2016  . Occipital neuralgia 10/12/2015  . Medication overuse headache 10/12/2015  . Instability of prosthetic knee ([place]) 09/22/2015  . Primary osteoarthritis of right hip 05/12/2015  . Sleep apnea 04/07/2015  . Primary osteoarthritis of left hip 12/16/2014  . Metabolic syndrome 38/32/9191  . Migraine without aura and without status migrainosus, not intractable 11/26/2014  . GERD without esophagitis 11/26/2014  . COPD, moderate ([place]) 11/26/2014  . Nocturnal oxygen desaturation 11/26/2014  . Supplemental oxygen dependent 11/26/2014  . Hearing loss 11/26/2014  . Chronic insomnia 11/26/2014  . History of hypertension 11/26/2014  . Dyslipidemia 11/26/2014  .  Morbid obesity ([place]) 11/26/2014  . Diabetes mellitus type 2, diet-controlled ([place]) 11/26/2014  . Chronic constipation 11/26/2014  . Generalized anxiety disorder 11/11/2014  . DDD (degenerative disc disease), lumbar 11/11/2014  . Facet arthritis of lumbar region ([place]) 11/11/2014  . Primary osteoarthritis involving multiple joints 11/11/2014  . H/O hysterectomy for benign disease 10/20/2014  . History of endometriosis 10/16/2014  . Low back derangement syndrome 09/30/2014  . Degenerative joint disease of both lower legs 09/30/2014  . Bipolar disorder ([place]) 09/30/2014     Review of Systems Cardiac: No angina GI: No constipation  Review of systems negative for cardiovascular negative for pulmonary she snores if her neurologic positive for anxiety depression panic attacks and insomnia she also has bipolar disease as CD with a history of reflux negative for GU negative for hematologic negative for endocrine negative for rheumatologic  Social history is positive for being divorced with one child she smokes pack cigarettes per day for doing this for 12 years  Currently disabled since 2006.  Family history is positive for alcoholism diabetes and high blood pressure     Objective:   Physical Exam  Patient is 51 year old white female in no acute distress is alert and oriented 3 and compliant with examination. Her heart is regular rate and rhythm.  She has some bilateral paraspinous muscle tenderness but no overt trigger points. She does have a well-healed scar affecting the right anterior knee.    Assessment & Plan:  #1. Chronic low back pain with low back syndrome and degenerative disc disease  #2. Diffuse body pain with Diffuse osteoarthritis  #3. Degenerative joint disease with severe left hip pain and right hip pain And bilateral knee painStatus post right knee revision  #4. History of bipolar disorder  #5. History of anxiety depression on chronic opioid management  Plan:   1.  We will refill her medications for the next 2 months for the oxycodone 10 mg tablets  2. She will need to do her urine tox screen  3. Continue follow-up with Dr. Rudene Johnson 4. Continue back stretching strengthening exercises as tolerated   We reviewed the [facility] practitioner database for opioid management and it was appropriate  Dr. Vashti Hey

## 2016-11-10 ENCOUNTER — Telehealth: Payer: Self-pay | Admitting: *Deleted

## 2016-11-10 DIAGNOSIS — M961 Postlaminectomy syndrome, not elsewhere classified: Secondary | ICD-10-CM | POA: Diagnosis not present

## 2016-11-10 NOTE — Telephone Encounter (Signed)
Called to pharmacy to find out what was going on with Rx.  States that they were told not to fill Rx;s any sooner than 30 days and today would be 1 day early.  Okayed for patient to have Rx filled today with pharmacy and called to patient to let her know that her next fill date would be 12/10/16 with meds to last until 01/09/17.  Patient verbalizes u/o information.

## 2016-11-15 DIAGNOSIS — K621 Rectal polyp: Secondary | ICD-10-CM | POA: Diagnosis not present

## 2016-11-15 LAB — TOXASSURE SELECT 13 (MW), URINE

## 2016-11-23 ENCOUNTER — Other Ambulatory Visit: Payer: Self-pay | Admitting: Anesthesiology

## 2016-11-23 DIAGNOSIS — Z96651 Presence of right artificial knee joint: Secondary | ICD-10-CM | POA: Diagnosis not present

## 2016-11-28 DIAGNOSIS — F17210 Nicotine dependence, cigarettes, uncomplicated: Secondary | ICD-10-CM | POA: Diagnosis not present

## 2016-11-28 DIAGNOSIS — Z8541 Personal history of malignant neoplasm of cervix uteri: Secondary | ICD-10-CM | POA: Diagnosis not present

## 2016-11-28 DIAGNOSIS — K621 Rectal polyp: Secondary | ICD-10-CM | POA: Diagnosis not present

## 2016-11-28 DIAGNOSIS — K635 Polyp of colon: Secondary | ICD-10-CM | POA: Diagnosis not present

## 2016-11-28 DIAGNOSIS — Z86010 Personal history of colonic polyps: Secondary | ICD-10-CM | POA: Diagnosis not present

## 2016-11-28 DIAGNOSIS — D125 Benign neoplasm of sigmoid colon: Secondary | ICD-10-CM | POA: Diagnosis not present

## 2016-11-28 DIAGNOSIS — Z90710 Acquired absence of both cervix and uterus: Secondary | ICD-10-CM | POA: Diagnosis not present

## 2016-11-28 DIAGNOSIS — Z96651 Presence of right artificial knee joint: Secondary | ICD-10-CM | POA: Diagnosis not present

## 2016-11-28 DIAGNOSIS — Z1211 Encounter for screening for malignant neoplasm of colon: Secondary | ICD-10-CM | POA: Diagnosis not present

## 2016-11-30 ENCOUNTER — Telehealth: Payer: Self-pay | Admitting: Anesthesiology

## 2016-11-30 NOTE — Telephone Encounter (Signed)
States she needs her flexaril refilled and pharmacy has been calling. We have no record of pharmacy calling. I did let her know we do not do pharmacy refills again. Please call patient when she can pick up meds

## 2016-11-30 NOTE — Telephone Encounter (Signed)
Patient advised that no prescriptions will be written outside of appointment.

## 2016-12-05 ENCOUNTER — Encounter: Payer: Self-pay | Admitting: Family Medicine

## 2016-12-09 ENCOUNTER — Telehealth: Payer: Self-pay | Admitting: Anesthesiology

## 2016-12-09 NOTE — Telephone Encounter (Signed)
Spoke with Bevely Palmer, [facility] and approved for Rx to fill on 12/10/16 since they will be losed on 12/11/16.  These dates do coincide with phone message from 11/10/16 where pharmacy was contacted about prior fill dates.

## 2016-12-09 NOTE — Telephone Encounter (Signed)
Brie from pharmacy called to speak with someone about [facility], no other info left in vmail. Please call pharmacy

## 2016-12-14 ENCOUNTER — Ambulatory Visit: Payer: Medicare Other | Admitting: Gastroenterology

## 2016-12-19 ENCOUNTER — Other Ambulatory Visit: Payer: Self-pay

## 2016-12-21 ENCOUNTER — Ambulatory Visit (INDEPENDENT_AMBULATORY_CARE_PROVIDER_SITE_OTHER): Payer: Medicare Other | Admitting: Surgery

## 2016-12-21 ENCOUNTER — Encounter: Payer: Self-pay | Admitting: Surgery

## 2016-12-21 VITALS — BP 146/97 | HR 75 | Temp 97.5°F | Ht 65.0 in | Wt 249.6 lb

## 2016-12-21 DIAGNOSIS — F902 Attention-deficit hyperactivity disorder, combined type: Secondary | ICD-10-CM | POA: Diagnosis not present

## 2016-12-21 DIAGNOSIS — F410 Panic disorder [episodic paroxysmal anxiety] without agoraphobia: Secondary | ICD-10-CM | POA: Diagnosis not present

## 2016-12-21 DIAGNOSIS — F4310 Post-traumatic stress disorder, unspecified: Secondary | ICD-10-CM | POA: Diagnosis not present

## 2016-12-21 DIAGNOSIS — F1190 Opioid use, unspecified, uncomplicated: Secondary | ICD-10-CM

## 2016-12-21 NOTE — Progress Notes (Signed)
Outpatient Surgical Follow Up  12/21/2016  Wendy Johnson is an 51 y.o. female.   Chief Complaint  Patient presents with  . Follow-up    Periumbilical Hernia    HPI: 51 year old female with a history of chronic pain she did have a right knee surgery by Dr. Kyla Balzarine 6 weeks ago and has recover well. She's been following up for her symptomatic incisional hernia. She reports she continues to have moderate to severe intermittent pain that is sharp. Apparently she did quit smoking. She does have a history of COPD as well She was asking me for some additional pain medication and I declined her request. She sees Dr. Andree Elk for chronic pain. She did have a history of hysterectomy with a lower midline laparotomy incision. I have her CT scan personally reviewed showing evidence of a small incisional hernia around the periumbilical area.  Past Medical History:  Diagnosis Date  . ADHD (attention deficit hyperactivity disorder)   . Anxiety   . AR (allergic rhinitis)   . Arthritis   . Benign hypertension   . Chronic back pain   . Chronic constipation   . Chronic insomnia   . COPD (chronic obstructive pulmonary disease) ([place])   . Deaf   . Decreased dorsalis pedis pulse   . Depression   . Dyslipidemia   . Fatty liver   . GERD (gastroesophageal reflux disease)   . Hepatomegaly   . High cholesterol   . Hot flashes   . Migraine with aura   . OCD (obsessive compulsive disorder)   . Plantar warts   . Severe obesity ([place])   . Shortness of breath dyspnea   . Sleep apnea    C-PAP  . Tobacco use   . Trochanteric bursitis of right hip     Past Surgical History:  Procedure Laterality Date  . ABDOMINAL HYSTERECTOMY N/A 10/20/2014   Procedure: Total abdominial hysterectomy, bilateral salpingo-oophorectomy;  Surgeon: Brayton Mars, MD;  Location: [facility] OR[facility];  Service: Gynecology;  Laterality: N/A;  . BILATERAL SALPINGOOPHORECTOMY    . bone spurs removed Bilateral   . COLONOSCOPY WITH  PROPOFOL N/A 09/01/2016   Procedure: COLONOSCOPY WITH PROPOFOL;  Surgeon: Jonathon Bellows, MD;  Location: [facility];  Service: Endoscopy;  Laterality: N/A;  . ESOPHAGOGASTRODUODENOSCOPY (EGD) WITH PROPOFOL N/A 09/01/2016   Procedure: ESOPHAGOGASTRODUODENOSCOPY (EGD) WITH PROPOFOL;  Surgeon: Jonathon Bellows, MD;  Location: [facility];  Service: Endoscopy;  Laterality: N/A;  . FOOT SURGERY Bilateral   . JOINT REPLACEMENT Right    Total Knee replacement X 2  . JOINT REPLACEMENT Bilateral    Total Hip Replacement  . knee arthroscopo Right   . LAPAROSCOPY  09/22/2014   Procedure: LAPAROSCOPY OPERATIVE;  Surgeon: Brayton Mars, MD;  Location: [facility];  Service: Gynecology;;  excision and fulgeration of endomertriosis  . LIPOMA EXCISION    . TONSILLECTOMY    . TOTAL HIP ARTHROPLASTY Left 12/16/2014   Procedure: TOTAL HIP ARTHROPLASTY ANTERIOR APPROACH;  Surgeon: Hessie Knows, MD;  Location: [facility];  Service: Orthopedics;  Laterality: Left;  . TOTAL HIP ARTHROPLASTY Right 05/12/2015   Procedure: TOTAL HIP ARTHROPLASTY ANTERIOR APPROACH;  Surgeon: Hessie Knows, MD;  Location: [facility];  Service: Orthopedics;  Laterality: Right;  . TOTAL KNEE REVISION Right 09/22/2015   Procedure: TOTAL KNEE REVISION/ REVISE POLYIETHYLENE;  Surgeon: Hessie Knows, MD;  Location: [facility];  Service: Orthopedics;  Laterality: Right;  . TOTAL KNEE REVISION Right 10/11/2016   Procedure: TOTAL KNEE REVISION;  Surgeon: Hessie Knows,  MD;  Location: [facility];  Service: Orthopedics;  Laterality: Right;  . TUBAL LIGATION      Family History  Problem Relation Age of Onset  . Diabetes Mother   . Heart disease Father   . Heart attack Father   . Diabetes Sister   . Drug abuse Sister   . Breast cancer Maternal Aunt        <50  . Breast cancer Paternal Aunt        x2.  <50  . Drug abuse Son     Social History:  reports that she has been smoking Cigarettes.  She started smoking about 37 years ago. She has a 18.50  pack-year smoking history. She has never used smokeless tobacco. She reports that she does not drink alcohol or use drugs.  Allergies:  Allergies  Allergen Reactions  . Codeine Nausea Only  . Imitrex [Sumatriptan] Other (See Comments)    Chest pain    Medications reviewed.    ROS Full ROS performed and is otherwise negative other than what is stated in HPI   BP (!) 146/97   Pulse 75   Temp (!) 97.5 F (36.4 C) (Oral)   Ht 5\' 5"  (1.651 m)   Wt 113.2 kg (249 lb 9.6 oz)   LMP 09/26/2014 Comment: Total  BMI 41.54 kg/m   Physical Exam  Constitutional: She is oriented to person, place, and time and well-developed, well-nourished, and in no distress. No distress.  Eyes: Right eye exhibits no discharge. Left eye exhibits no discharge. No scleral icterus.  Neck: No JVD present. No tracheal deviation present.  Pulmonary/Chest: Effort normal. No stridor. She exhibits no tenderness.  Abdominal: Soft. She exhibits no distension. There is no tenderness. There is no rebound.  Small incisional reducible hernia on the top of her previous midline laparotomy incision. No evidence of peritonitis  Musculoskeletal: Normal range of motion. She exhibits no edema.  Neurological: She is alert and oriented to person, place, and time. Gait normal. GCS score is 15.  Skin: Skin is warm and dry. She is not diaphoretic.  Psychiatric: Mood, memory, affect and judgment normal.  Nursing note and vitals reviewed.  Assessment/Plan:  Symptomatic incisional hernia. Discussed with the patient in detail that this at some point is to be fixed. Patient has a complicated history of narcotic use and I am not sure if her symptomatology history related to her small incisional defect. I discussed with her that first we needed to get her in the best possible shape preoperatively before elective hernia repair. I am going to check a urine toxicology screen as well as a nicotine. And she tells me that she recently quit  smoking. I discussed with her about this importance of smoking cessation before elective hernia repair. This will significantly improve her outcomes. In the meantime I'll also recommend weight loss that she will continue to do with diet and exercise. Her last weight was 113 kg   Caroleen Hamman, MD [facility] General Surgeon

## 2016-12-21 NOTE — Patient Instructions (Signed)
We have scheduled for you to have some testing done. You can have these done today at the [facility].   Directions to [facility]: When leaving our office, go right. Go all of the way down to the very end of the hallway. You will have a purple wall in front of you. You will now have a tunnel to the hospital on your left hand side. Go through this tunnel and the elevators will be on your left. Go down to the 1st floor and take a slight left. The very first desk on the right hand side is the registration desk.   We will see you back in office as listed below:

## 2016-12-29 ENCOUNTER — Other Ambulatory Visit: Payer: Self-pay

## 2016-12-29 MED ORDER — POLYETHYLENE GLYCOL 3350 17 G PO PACK: 17.0000 g | PACK | Freq: Every day | ORAL | 3 refills | Status: DC

## 2017-01-02 ENCOUNTER — Encounter: Payer: Medicare Other | Admitting: Anesthesiology

## 2017-01-02 ENCOUNTER — Telehealth: Payer: Self-pay | Admitting: Surgery

## 2017-01-02 NOTE — Telephone Encounter (Signed)
Left message for patient to call office regarding phone message she left this morning.

## 2017-01-02 NOTE — Telephone Encounter (Signed)
Wendy Johnson has called and rescheduled her appointment with Dr Dahlia Byes on 02/03/17 and has rescheduled to 01/26/17. She states that her insurance will not pay for her Nicotine screening. Wendy Johnson has Medicare and Medicaid. Please advise Wendy Johnson.

## 2017-01-02 NOTE — Telephone Encounter (Signed)
Patient stated she has a death in her family and that is the reason for rescheduling of her appointment. She stated she has not smoked in 3 days and that she will quit permanently. Medicare and Medicaid does not pay for smoking cessation classes or medication to help with quitting.

## 2017-01-04 DIAGNOSIS — F902 Attention-deficit hyperactivity disorder, combined type: Secondary | ICD-10-CM | POA: Diagnosis not present

## 2017-01-04 DIAGNOSIS — F4310 Post-traumatic stress disorder, unspecified: Secondary | ICD-10-CM | POA: Diagnosis not present

## 2017-01-04 DIAGNOSIS — F901 Attention-deficit hyperactivity disorder, predominantly hyperactive type: Secondary | ICD-10-CM | POA: Diagnosis not present

## 2017-01-04 DIAGNOSIS — F17200 Nicotine dependence, unspecified, uncomplicated: Secondary | ICD-10-CM | POA: Diagnosis not present

## 2017-01-04 DIAGNOSIS — Z79899 Other long term (current) drug therapy: Secondary | ICD-10-CM | POA: Diagnosis not present

## 2017-01-04 DIAGNOSIS — F3189 Other bipolar disorder: Secondary | ICD-10-CM | POA: Diagnosis not present

## 2017-01-04 DIAGNOSIS — F410 Panic disorder [episodic paroxysmal anxiety] without agoraphobia: Secondary | ICD-10-CM | POA: Diagnosis not present

## 2017-01-05 ENCOUNTER — Ambulatory Visit: Payer: Self-pay | Admitting: Surgery

## 2017-01-10 ENCOUNTER — Ambulatory Visit: Payer: Medicare Other | Attending: Anesthesiology | Admitting: Anesthesiology

## 2017-01-10 ENCOUNTER — Encounter: Payer: Self-pay | Admitting: Anesthesiology

## 2017-01-10 VITALS — BP 140/90 | HR 95 | Temp 97.5°F | Resp 16 | Ht 64.0 in | Wt 245.0 lb

## 2017-01-10 DIAGNOSIS — M961 Postlaminectomy syndrome, not elsewhere classified: Secondary | ICD-10-CM

## 2017-01-10 DIAGNOSIS — G8918 Other acute postprocedural pain: Secondary | ICD-10-CM | POA: Insufficient documentation

## 2017-01-10 DIAGNOSIS — M542 Cervicalgia: Secondary | ICD-10-CM | POA: Insufficient documentation

## 2017-01-10 DIAGNOSIS — F1190 Opioid use, unspecified, uncomplicated: Secondary | ICD-10-CM

## 2017-01-10 DIAGNOSIS — M150 Primary generalized (osteo)arthritis: Secondary | ICD-10-CM | POA: Diagnosis not present

## 2017-01-10 DIAGNOSIS — M545 Low back pain, unspecified: Secondary | ICD-10-CM

## 2017-01-10 DIAGNOSIS — M4696 Unspecified inflammatory spondylopathy, lumbar region: Secondary | ICD-10-CM | POA: Diagnosis not present

## 2017-01-10 DIAGNOSIS — Z79891 Long term (current) use of opiate analgesic: Secondary | ICD-10-CM | POA: Insufficient documentation

## 2017-01-10 DIAGNOSIS — M1991 Primary osteoarthritis, unspecified site: Secondary | ICD-10-CM | POA: Diagnosis not present

## 2017-01-10 DIAGNOSIS — Z79899 Other long term (current) drug therapy: Secondary | ICD-10-CM | POA: Diagnosis not present

## 2017-01-10 DIAGNOSIS — M5136 Other intervertebral disc degeneration, lumbar region: Secondary | ICD-10-CM | POA: Diagnosis not present

## 2017-01-10 DIAGNOSIS — M8949 Other hypertrophic osteoarthropathy, multiple sites: Secondary | ICD-10-CM

## 2017-01-10 DIAGNOSIS — Z842 Family history of other diseases of the genitourinary system: Secondary | ICD-10-CM

## 2017-01-10 DIAGNOSIS — Z8489 Family history of other specified conditions: Secondary | ICD-10-CM

## 2017-01-10 DIAGNOSIS — K439 Ventral hernia without obstruction or gangrene: Secondary | ICD-10-CM | POA: Diagnosis not present

## 2017-01-10 DIAGNOSIS — M159 Polyosteoarthritis, unspecified: Secondary | ICD-10-CM

## 2017-01-10 DIAGNOSIS — M4686 Other specified inflammatory spondylopathies, lumbar region: Secondary | ICD-10-CM | POA: Diagnosis not present

## 2017-01-10 DIAGNOSIS — M47816 Spondylosis without myelopathy or radiculopathy, lumbar region: Secondary | ICD-10-CM

## 2017-01-10 MED ORDER — OXYCODONE HCL 10 MG PO TABS: 10.0000 mg | ORAL_TABLET | Freq: Four times a day (QID) | ORAL | 0 refills | Status: DC

## 2017-01-10 NOTE — Progress Notes (Signed)
Nursing Pain Medication Assessment:  Safety precautions to be maintained throughout the outpatient stay will include: orient to surroundings, keep bed in low position, maintain call bell within reach at all times, provide assistance with transfer out of bed and ambulation.  Medication Inspection Compliance: Ms. Hay did not comply with our request to bring her pills to be counted. She was reminded that bringing the medication bottles, even when empty, is a requirement.  Medication: None brought in. Pill/Patch Count: None available to be counted. Bottle Appearance: No container available. Did not bring bottle(s) to appointment. Filled Date: N/A Last Medication intake:  Yesterday 

## 2017-01-11 NOTE — Progress Notes (Signed)
Subjective:  Patient ID: Wendy Johnson, female    DOB: 1966/03/03  Age: 51 y.o. MRN: [id number]  CC: Back Pain (lower)   HPI Wendy Johnson [facility] presents for Reevaluation. She was last seen 2 months ago and continues to do well with her current medication regimen. She takes her medications as prescribed oxycodone 10 mg tablets 3 or 4 times a day. This is been well tolerated and based on her narcotic assessment sheet she is deriving good lifestyle functional improvement with them. Unfortunately she continues to have diffuse body pain with osteoarthritis symptoms involving the bilateral hips and knees occasionally shoulders. The quality of this pain is been stable in nature and she furthermore denies any change in lower extremity strength or function. Unfortunately she has been diagnosed with a ventral hernia which is to be repaired surgically here in the next several weeks. She reports that otherwise she is in her usual state of health.   Outpatient Medications Prior to Visit  Medication Sig Dispense Refill  . amphetamine-dextroamphetamine (ADDERALL) 30 MG tablet Take 30 mg by mouth 2 (two) times daily.    . ARIPiprazole (ABILIFY) 15 MG tablet Take 15 mg by mouth at bedtime.    . busPIRone (BUSPAR) 30 MG tablet Take 30 mg by mouth every evening.     . clonazePAM (KLONOPIN) 1 MG tablet Take 1-2 mg by mouth 3 (three) times daily as needed for anxiety (takes 2 every  morning and then 1-2 tablets as needed during the day for anxiety).     . cyclobenzaprine (FLEXERIL) 10 MG tablet TAKE 1 TABLET BY MOUTH TWICE A DAY 60 tablet 2  . dexlansoprazole (DEXILANT) 60 MG capsule Take 1 capsule (60 mg total) by mouth daily. 30 capsule 5  . hydrOXYzine (ATARAX/VISTARIL) 25 MG tablet Take 25 mg by mouth daily as needed for anxiety or itching (hives).     . polyethylene glycol (MIRALAX / GLYCOLAX) packet Take 17 g by mouth daily. 14 each 3  . sertraline (ZOLOFT) 100 MG tablet Take 100 mg by mouth 2 (two) times daily.      Marland Kitchen tiotropium (SPIRIVA) 18 MCG inhalation capsule Place 1 capsule (18 mcg total) into inhaler and inhale daily. 30 capsule 5  . traZODone (DESYREL) 150 MG tablet Take 300 mg by mouth at bedtime.     . Oxycodone HCl 10 MG TABS Take 1 tablet (10 mg total) by mouth QID. 120 tablet 0  . enoxaparin (LOVENOX) 40 MG/0.4ML injection Inject 0.4 mLs (40 mg total) into the skin daily. 14 Syringe 0   No facility-administered medications prior to visit.    ROS Review of Systems  Cardiac: No angina or dyspnea Pulmonary: No shortness of breath CNS: No sedation GI: No constipation  Objective:  BP 140/90   Pulse 95   Temp (!) 97.5 F (36.4 C)   Resp 16   Ht 5\' 4"  (1.626 m)   Wt 245 lb (111.1 kg)   LMP 09/26/2014 Comment: Total  SpO2 96%   BMI 42.05 kg/m    BP Readings from Last 3 Encounters:  01/10/17 140/90  12/21/16 (!) 146/97  11/03/16 (!) 146/102     Wt Readings from Last 3 Encounters:  01/10/17 245 lb (111.1 kg)  12/21/16 249 lb 9.6 oz (113.2 kg)  11/03/16 235 lb (106.6 kg)     Physical Exam  PERRL EOMI HEART IS RRR  LCTA MUSCULOSKELETALReveals some paraspinous muscle tenderness in the low back but no overt trigger points. She has  good range of motion of bilateral knees with no appreciable swelling noted. She is ambulating with an antalgic gait. Her muscle tone and bulk is at baseline.  Labs  Lab Results  Component Value Date   HGBA1C 5.5 08/29/2016   HGBA1C 5.3 07/28/2016   HGBA1C 5.5 02/03/2016   Lab Results  Component Value Date   MICROALBUR 20 07/28/2016   LDLCALC NOT CALC 07/28/2016   CREATININE 0.63 10/12/2016    -------------------------------------------------------------------------------------------------------------------- Lab Results  Component Value Date   WBC 10.9 10/12/2016   HGB 13.0 10/12/2016   HCT 39.3 10/12/2016   PLT 238 10/12/2016   GLUCOSE 228 (H) 10/12/2016   CHOL 168 07/28/2016   TRIG 466 (H) 07/28/2016   HDL 24 (L) 07/28/2016    LDLCALC NOT CALC 07/28/2016   ALT 14 07/28/2016   AST 15 07/28/2016   NA 137 10/12/2016   K 3.4 (L) 10/12/2016   CL 100 (L) 10/12/2016   CREATININE 0.63 10/12/2016   BUN 6 10/12/2016   CO2 26 10/12/2016   TSH 0.769 02/03/2016   INR 0.97 10/04/2016   HGBA1C 5.5 08/29/2016   MICROALBUR 20 07/28/2016    --------------------------------------------------------------------------------------------------------------------- No results found.   Assessment & Plan:   Wendy Johnson was seen today for back pain.  Diagnoses and all orders for this visit:  Failed back surgical syndrome  Cervicalgia  Primary osteoarthritis involving multiple joints  Low back pain at multiple sites  Chronic, continuous use of opioids  Facet arthritis of lumbar region ([place])  DDD (degenerative disc disease), lumbar  Post-op pain -     Discontinue: Oxycodone HCl 10 MG TABS; Take 1 tablet (10 mg total) by mouth QID. -     Oxycodone HCl 10 MG TABS; Take 1 tablet (10 mg total) by mouth QID.  FH: TAH-BSO (total abdominal hysterectomy and bilateral salpingo-oophorectomy) -     Discontinue: Oxycodone HCl 10 MG TABS; Take 1 tablet (10 mg total) by mouth QID. -     Oxycodone HCl 10 MG TABS; Take 1 tablet (10 mg total) by mouth QID.        ----------------------------------------------------------------------------------------------------------------------  Problem List Items Addressed This Visit      Musculoskeletal and Integument   DDD (degenerative disc disease), lumbar   Relevant Medications   Oxycodone HCl 10 MG TABS   Facet arthritis of lumbar region ([place])   Relevant Medications   Oxycodone HCl 10 MG TABS   Primary osteoarthritis involving multiple joints   Relevant Medications   Oxycodone HCl 10 MG TABS    Other Visit Diagnoses    Failed back surgical syndrome    -  Primary   Relevant Medications   Oxycodone HCl 10 MG TABS   Cervicalgia       Low back pain at multiple sites        Relevant Medications   Oxycodone HCl 10 MG TABS   Chronic, continuous use of opioids       Post-op pain       Relevant Medications   Oxycodone HCl 10 MG TABS   FH: TAH-BSO (total abdominal hysterectomy and bilateral salpingo-oophorectomy)       Relevant Medications   Oxycodone HCl 10 MG TABS        ----------------------------------------------------------------------------------------------------------------------  1. Failed back surgical syndrome We will refill her medications for September 4 and October 4 for her oxycodone 10 mg tablets. We have reviewed to [facility] practitioner database information and it is appropriate. She is to return to clinic in  2 months for reevaluation. Continue follow-up with her orthopedic and primary care physicians. No further interventional therapy is indicated at this time.  2. Cervicalgia   3. Primary osteoarthritis involving multiple joints   4. Low back pain at multiple sites   5. Chronic, continuous use of opioids   6. Facet arthritis of lumbar region ([place])   7. DDD (degenerative disc disease), lumbar Continue efforts at weight loss as reviewed today with efforts at back stretching strengthening exercises as well.  8. Post-op pain  - 9. FH: TAH-BSO (total abdominal hysterectomy and bilateral salpingo-oophorectomy)     ----------------------------------------------------------------------------------------------------------------------  I have discontinued Ms. Friend's enoxaparin and polyethylene glycol powder. I am also having her maintain her ARIPiprazole, amphetamine-dextroamphetamine, traZODone, clonazePAM, busPIRone, sertraline, dexlansoprazole, tiotropium, hydrOXYzine, cyclobenzaprine, polyethylene glycol, and Oxycodone HCl.   Meds ordered this encounter  Medications  . DISCONTD: polyethylene glycol powder (GLYCOLAX/MIRALAX) powder  . DISCONTD: Oxycodone HCl 10 MG TABS    Sig: Take 1 tablet (10 mg total) by mouth  QID.    Dispense:  120 tablet    Refill:  0    Do not fill until [id number]  . Oxycodone HCl 10 MG TABS    Sig: Take 1 tablet (10 mg total) by mouth QID.    Dispense:  120 tablet    Refill:  0    Do not fill until [id number]   Patient's Medications  New Prescriptions   No medications on file  Previous Medications   AMPHETAMINE-DEXTROAMPHETAMINE (ADDERALL) 30 MG TABLET    Take 30 mg by mouth 2 (two) times daily.   ARIPIPRAZOLE (ABILIFY) 15 MG TABLET    Take 15 mg by mouth at bedtime.   BUSPIRONE (BUSPAR) 30 MG TABLET    Take 30 mg by mouth every evening.    CLONAZEPAM (KLONOPIN) 1 MG TABLET    Take 1-2 mg by mouth 3 (three) times daily as needed for anxiety (takes 2 every  morning and then 1-2 tablets as needed during the day for anxiety).    CYCLOBENZAPRINE (FLEXERIL) 10 MG TABLET    TAKE 1 TABLET BY MOUTH TWICE A DAY   DEXLANSOPRAZOLE (DEXILANT) 60 MG CAPSULE    Take 1 capsule (60 mg total) by mouth daily.   HYDROXYZINE (ATARAX/VISTARIL) 25 MG TABLET    Take 25 mg by mouth daily as needed for anxiety or itching (hives).    POLYETHYLENE GLYCOL (MIRALAX / GLYCOLAX) PACKET    Take 17 g by mouth daily.   SERTRALINE (ZOLOFT) 100 MG TABLET    Take 100 mg by mouth 2 (two) times daily.    TIOTROPIUM (SPIRIVA) 18 MCG INHALATION CAPSULE    Place 1 capsule (18 mcg total) into inhaler and inhale daily.   TRAZODONE (DESYREL) 150 MG TABLET    Take 300 mg by mouth at bedtime.   Modified Medications   Modified Medication Previous Medication   OXYCODONE HCL 10 MG TABS Oxycodone HCl 10 MG TABS      Take 1 tablet (10 mg total) by mouth QID.    Take 1 tablet (10 mg total) by mouth QID.  Discontinued Medications   ENOXAPARIN (LOVENOX) 40 MG/0.4ML INJECTION    Inject 0.4 mLs (40 mg total) into the skin daily.   POLYETHYLENE GLYCOL POWDER (GLYCOLAX/MIRALAX) POWDER       ----------------------------------------------------------------------------------------------------------------------  Follow-up: Return  in about 2 months (around 03/12/2017) for evaluation, med refill.  Greater than 50% of the total encounter time was spent in counseling and / or coordination  of care.   Molli Barrows, MD

## 2017-01-16 ENCOUNTER — Telehealth: Payer: Self-pay | Admitting: Family Medicine

## 2017-01-16 NOTE — Telephone Encounter (Signed)
appt made for Thursday morning.

## 2017-01-16 NOTE — Telephone Encounter (Signed)
Unfortunately she will need to come in again. Last visit was in April. We will need new vital signs and weight to start medication

## 2017-01-16 NOTE — Telephone Encounter (Signed)
Pt is requesting a prescription on belviq. States her and dr Ancil Boozer had discussed this and dr Ancil Boozer informed her that she will have to pay out of pocket due to insurance and pt is willing.  [facility]. Please give pt a call once this prescription has been sent to her pharmacy. [phone]

## 2017-01-19 ENCOUNTER — Ambulatory Visit (INDEPENDENT_AMBULATORY_CARE_PROVIDER_SITE_OTHER): Payer: Medicare Other | Admitting: Family Medicine

## 2017-01-19 ENCOUNTER — Encounter: Payer: Self-pay | Admitting: Family Medicine

## 2017-01-19 VITALS — BP 118/68 | HR 86 | Temp 98.7°F | Resp 16 | Ht 64.0 in | Wt 247.6 lb

## 2017-01-19 DIAGNOSIS — J449 Chronic obstructive pulmonary disease, unspecified: Secondary | ICD-10-CM | POA: Diagnosis not present

## 2017-01-19 DIAGNOSIS — E1169 Type 2 diabetes mellitus with other specified complication: Secondary | ICD-10-CM

## 2017-01-19 DIAGNOSIS — Z23 Encounter for immunization: Secondary | ICD-10-CM | POA: Diagnosis not present

## 2017-01-19 DIAGNOSIS — K219 Gastro-esophageal reflux disease without esophagitis: Secondary | ICD-10-CM

## 2017-01-19 DIAGNOSIS — Z79899 Other long term (current) drug therapy: Secondary | ICD-10-CM

## 2017-01-19 DIAGNOSIS — M4696 Unspecified inflammatory spondylopathy, lumbar region: Secondary | ICD-10-CM

## 2017-01-19 DIAGNOSIS — E785 Hyperlipidemia, unspecified: Secondary | ICD-10-CM | POA: Diagnosis not present

## 2017-01-19 DIAGNOSIS — F3131 Bipolar disorder, current episode depressed, mild: Secondary | ICD-10-CM | POA: Diagnosis not present

## 2017-01-19 DIAGNOSIS — M47816 Spondylosis without myelopathy or radiculopathy, lumbar region: Secondary | ICD-10-CM

## 2017-01-19 MED ORDER — DEXLANSOPRAZOLE 60 MG PO CPDR: 60.0000 mg | DELAYED_RELEASE_CAPSULE | Freq: Every day | ORAL | 5 refills | Status: DC

## 2017-01-19 MED ORDER — SEMAGLUTIDE(0.25 OR 0.5MG/DOS) 2 MG/1.5ML ~~LOC~~ SOPN: 0.5000 mg | PEN_INJECTOR | SUBCUTANEOUS | 2 refills | Status: DC

## 2017-01-19 MED ORDER — UMECLIDINIUM-VILANTEROL 62.5-25 MCG/INH IN AEPB: 1.0000 | INHALATION_SPRAY | Freq: Every day | RESPIRATORY_TRACT | 5 refills | Status: DC

## 2017-01-19 NOTE — Progress Notes (Signed)
Name: Wendy Johnson   MRN: [id number]    DOB: [birth year]   Date:01/19/2017       Progress Note  Subjective  Chief Complaint  Chief Complaint  Patient presents with  . Obesity    weight management  . Diabetes  . Hypertension    HPI  DM: diet controlled with dyslipidemia:  needs yearly eye exam. Denies polyphagia, polydipsia or polyuria. She had very high triglycerides and she was given Vasepa but she is not currently taking it. We will recheck labs. Also very low HDL.   Dyslipidemia: HDL was very low 6 months ago. She has high triglycerides also, trying to eat more fruit and vegetables, she also has added fish to her diet, discussed adding tree nuts  DDD lumbar spine: she sees pain clinic, on Flexeril and also Oxycodone, pain level is 3/10 at this time, pain is on her back, neck some on shoulder and right knee  GERD: taking Dexilant and seen by Dr. Vicente Males, but she states that she still has heartburn about every 3 days, still drinking sodas, and needs to change diet.   Morbid obesity: she has incisional hernia and was told that she must lose weight prior to having it repaired, sehe has been unable to exercise because of pain, she is trying to drink less sodas, she has been avoiding eating out and when they go she orders salads.   Bipolar disorder: on multiple medications , given by Dr. Kasandra Knudsen  Patient Active Problem List   Diagnosis Date Noted  . Painful total knee replacement, right ([place]) 10/11/2016  . Rotator cuff tendinitis, right 07/29/2016  . Occipital neuralgia 10/12/2015  . Medication overuse headache 10/12/2015  . Instability of prosthetic knee ([place]) 09/22/2015  . Primary osteoarthritis of right hip 05/12/2015  . Sleep apnea 04/07/2015  . Primary osteoarthritis of left hip 12/16/2014  . Metabolic syndrome 93/26/7124  . Migraine without aura and without status migrainosus, not intractable 11/26/2014  . GERD without esophagitis 11/26/2014  . COPD, moderate ([place]) 11/26/2014  .  Nocturnal oxygen desaturation 11/26/2014  . Supplemental oxygen dependent 11/26/2014  . Hearing loss 11/26/2014  . Chronic insomnia 11/26/2014  . History of hypertension 11/26/2014  . Dyslipidemia 11/26/2014  . Morbid obesity ([place]) 11/26/2014  . Diabetes mellitus type 2, diet-controlled ([place]) 11/26/2014  . Chronic constipation 11/26/2014  . Generalized anxiety disorder 11/11/2014  . DDD (degenerative disc disease), lumbar 11/11/2014  . Facet arthritis of lumbar region ([place]) 11/11/2014  . Primary osteoarthritis involving multiple joints 11/11/2014  . H/O hysterectomy for benign disease 10/20/2014  . History of endometriosis 10/16/2014  . Low back derangement syndrome 09/30/2014  . Degenerative joint disease of both lower legs 09/30/2014  . Bipolar disorder ([place]) 09/30/2014    Past Surgical History:  Procedure Laterality Date  . ABDOMINAL HYSTERECTOMY N/A 10/20/2014   Procedure: Total abdominial hysterectomy, bilateral salpingo-oophorectomy;  Surgeon: Brayton Mars, MD;  Location: [facility];  Service: Gynecology;  Laterality: N/A;  . BILATERAL SALPINGOOPHORECTOMY    . bone spurs removed Bilateral   . COLONOSCOPY WITH PROPOFOL N/A 09/01/2016   Procedure: COLONOSCOPY WITH PROPOFOL;  Surgeon: Jonathon Bellows, MD;  Location: [facility];  Service: Endoscopy;  Laterality: N/A;  . ESOPHAGOGASTRODUODENOSCOPY (EGD) WITH PROPOFOL N/A 09/01/2016   Procedure: ESOPHAGOGASTRODUODENOSCOPY (EGD) WITH PROPOFOL;  Surgeon: Jonathon Bellows, MD;  Location: [facility];  Service: Endoscopy;  Laterality: N/A;  . FOOT SURGERY Bilateral   . JOINT REPLACEMENT Right    Total Knee replacement X 2  .  JOINT REPLACEMENT Bilateral    Total Hip Replacement  . knee arthroscopo Right   . LAPAROSCOPY  09/22/2014   Procedure: LAPAROSCOPY OPERATIVE;  Surgeon: Brayton Mars, MD;  Location: [facility];  Service: Gynecology;;  excision and fulgeration of endomertriosis  . LIPOMA EXCISION    . TONSILLECTOMY    .  TOTAL HIP ARTHROPLASTY Left 12/16/2014   Procedure: TOTAL HIP ARTHROPLASTY ANTERIOR APPROACH;  Surgeon: Hessie Knows, MD;  Location: [facility];  Service: Orthopedics;  Laterality: Left;  . TOTAL HIP ARTHROPLASTY Right 05/12/2015   Procedure: TOTAL HIP ARTHROPLASTY ANTERIOR APPROACH;  Surgeon: Hessie Knows, MD;  Location: [facility];  Service: Orthopedics;  Laterality: Right;  . TOTAL KNEE REVISION Right 09/22/2015   Procedure: TOTAL KNEE REVISION/ REVISE POLYIETHYLENE;  Surgeon: Hessie Knows, MD;  Location: [facility];  Service: Orthopedics;  Laterality: Right;  . TOTAL KNEE REVISION Right 10/11/2016   Procedure: TOTAL KNEE REVISION;  Surgeon: Hessie Knows, MD;  Location: [facility];  Service: Orthopedics;  Laterality: Right;  . TUBAL LIGATION      Family History  Problem Relation Age of Onset  . Diabetes Mother   . Heart disease Father   . Heart attack Father   . Diabetes Sister   . Drug abuse Sister   . Breast cancer Maternal Aunt        <50  . Breast cancer Paternal Aunt        x2.  <50  . Drug abuse Son     Social History   Social History  . Marital status: Divorced    Spouse name: N/A  . Number of children: N/A  . Years of education: N/A   Occupational History  . disabled    Social History Main Topics  . Smoking status: Current Every Day Smoker    Packs/day: 0.50    Years: 37.00    Types: Cigarettes    Start date: 11/26/1979  . Smokeless tobacco: Never Used  . Alcohol use No  . Drug use: No     Comment: quit crack cocaine 2004  . Sexual activity: Yes    Partners: Male    Birth control/ protection: None   Other Topics Concern  . Not on file   Social History Narrative   Lives with boyfriend   Disable from psychiatric disease     Current Outpatient Prescriptions:  .  amphetamine-dextroamphetamine (ADDERALL) 30 MG tablet, Take 30 mg by mouth 2 (two) times daily., Disp: , Rfl:  .  ARIPiprazole (ABILIFY) 15 MG tablet, Take 15 mg by mouth at bedtime., Disp: , Rfl:  .   busPIRone (BUSPAR) 30 MG tablet, Take 30 mg by mouth every evening. , Disp: , Rfl:  .  clonazePAM (KLONOPIN) 1 MG tablet, Take 1-2 mg by mouth 3 (three) times daily as needed for anxiety (takes 2 every  morning and then 1-2 tablets as needed during the day for anxiety). , Disp: , Rfl:  .  cyclobenzaprine (FLEXERIL) 10 MG tablet, TAKE 1 TABLET BY MOUTH TWICE A DAY, Disp: 60 tablet, Rfl: 2 .  dexlansoprazole (DEXILANT) 60 MG capsule, Take 1 capsule (60 mg total) by mouth daily., Disp: 30 capsule, Rfl: 5 .  hydrOXYzine (ATARAX/VISTARIL) 25 MG tablet, Take 25 mg by mouth daily as needed for anxiety or itching (hives). , Disp: , Rfl:  .  Oxycodone HCl 10 MG TABS, Take 1 tablet (10 mg total) by mouth QID., Disp: 120 tablet, Rfl: 0 .  polyethylene glycol (MIRALAX / GLYCOLAX) packet,  Take 17 g by mouth daily., Disp: 14 each, Rfl: 3 .  sertraline (ZOLOFT) 100 MG tablet, Take 100 mg by mouth 2 (two) times daily. , Disp: , Rfl:  .  traZODone (DESYREL) 150 MG tablet, Take 300 mg by mouth at bedtime. , Disp: , Rfl:  .  Semaglutide (OZEMPIC) 0.25 or 0.5 MG/DOSE SOPN, Inject 0.5 mg into the skin once a week., Disp: 2 pen, Rfl: 2 .  umeclidinium-vilanterol (ANORO ELLIPTA) 62.5-25 MCG/INH AEPB, Inhale 1 puff into the lungs daily., Disp: 60 each, Rfl: 5  Allergies  Allergen Reactions  . Codeine Nausea Only  . Imitrex [Sumatriptan] Other (See Comments)    Chest pain     ROS  Constitutional: Negative for fever or weight change.  Respiratory: Negative for cough and shortness of breath, but has wheezing .   Cardiovascular: Negative for chest pain or palpitations.  Gastrointestinal: Negative for abdominal pain, no bowel changes.  Musculoskeletal: Negative for gait problem but has  joint swelling - right knee.  Skin: Negative for rash.  Neurological: Negative for dizziness, no recent  headache.  No other specific complaints in a complete review of systems (except as listed in HPI above).  Objective  Vitals:    01/19/17 0845  BP: 118/68  Pulse: 86  Resp: 16  Temp: 98.7 F (37.1 C)  SpO2: 95%  Weight: 247 lb 9 oz (112.3 kg)  Height: 5\' 4"  (1.626 m)    Body mass index is 42.49 kg/m.  Physical Exam  Constitutional: Patient appears well-developed and well-nourished. Obese  No distress.  HEENT: head atraumatic, normocephalic, pupils equal and reactive to light, ears normal TM, neck supple, throat within normal limits Cardiovascular: Normal rate, regular rhythm and normal heart sounds.  No murmur heard. No BLE edema. Pulmonary/Chest: Effort normal and breath sounds normal. No respiratory distress. Abdominal: Soft.  There is no tenderness. Muscular Skeletal: negative straight leg raise, normal rom of spine Psychiatric: Patient has a normal mood and affect. behavior is normal. Judgment and thought content normal.  Recent Results (from the past 2160 hour(s))  ToxASSURE Select 13 (MW), Urine     Status: None   Collection Time: 11/10/16 12:07 PM  Result Value Ref Range   Summary FINAL     Comment: ==================================================================== TOXASSURE SELECT 13 (MW) ==================================================================== Test                             Result       Flag       Units Drug Present and Declared for Prescription Verification   7-aminoclonazepam              470          EXPECTED   ng/mg creat    7-aminoclonazepam is an expected metabolite of clonazepam. Source    of clonazepam is a scheduled prescription medication.   Oxycodone                      1977         EXPECTED   ng/mg creat   Oxymorphone                    1153         EXPECTED   ng/mg creat   Noroxycodone                   1726  EXPECTED   ng/mg creat   Noroxymorphone                 221          EXPECTED   ng/mg creat    Sources of oxycodone are scheduled prescription medications.    Oxymorphone, noroxycodone, and noroxymorphone are expected    metabolites of oxycodone.  Oxymorphone is also available as a    scheduled prescription medication. Dru g Absent but Declared for Prescription Verification   Amphetamine                    Not Detected UNEXPECTED ng/mg creat ==================================================================== Test                      Result    Flag   Units      Ref Range   Creatinine              43               mg/dL      >=20 ==================================================================== Declared Medications:  The flagging and interpretation on this report are based on the  following declared medications.  Unexpected results may arise from  inaccuracies in the declared medications.  **Note: The testing scope of this panel includes these medications:  Amphetamine  Amphetamine (Dextroamphetamine)  Clonazepam  Oxycodone  **Note: The testing scope of this panel does not include following  reported medications:  Aripiprazole  Buspirone  Cyclobenzaprine  Dexlansoprazole  Diclofenac  Enoxaparin  Hydroxyzine  Polyethylene Glycol  Sertraline  Tiotropium  Trazodone =========== ========================================================= For clinical consultation, please call [phone]. ====================================================================     Diabetic Foot Exam: Diabetic Foot Exam - Simple   Simple Foot Form Diabetic Foot exam was performed with the following findings:  Yes 01/19/2017  9:33 AM  Visual Inspection See comments:  Yes Sensation Testing Intact to touch and monofilament testing bilaterally:  Yes Pulse Check Posterior Tibialis and Dorsalis pulse intact bilaterally:  Yes Comments No toe nails, removed by podiatrist      PHQ2/9: Depression screen [facility] 2/9 01/10/2017 11/03/2016 08/23/2016 07/05/2016 05/05/2016  Decreased Interest 0 0 0 0 0  Down, Depressed, Hopeless 0 0 0 0 0  PHQ - 2 Score 0 0 0 0 0     Fall Risk: Fall Risk  01/10/2017 11/03/2016 08/23/2016 07/05/2016 05/05/2016   Falls in the past year? No No No No No  Number falls in past yr: - - - - -  Injury with Fall? - - - - -  [place] for fall due to : - - - - -  Follow up - - - - -  Comment - - - - -     Assessment & Plan  1. Diabetes mellitus type 2, dyslipidemia(HCC)  - Hemoglobin A1c - Insulin, fasting We will get labs and start Ozempic on Monday when she returns for lab work   2. Facet arthritis of lumbar region [facility])  Continue follow up with pain clinic  3. Bipolar affective disorder, currently depressed, mild (HCC)  Sees Dr. Kasandra Knudsen, and is stable  4. COPD, moderate ([place])  She is on Spiriva, but still has episodes os wheezing we will try Anoro - umeclidinium-vilanterol (ANORO ELLIPTA) 62.5-25 MCG/INH AEPB; Inhale 1 puff into the lungs daily.  Dispense: 60 each; Refill: 5  5. Morbid obesity, unspecified obesity type ([place])  She has failed trulicity, but has  DM and we will try Ozempic  6. GERD without esophagitis  - dexlansoprazole (DEXILANT) 60 MG capsule; Take 1 capsule (60 mg total) by mouth daily.  Dispense: 30 capsule; Refill: 5  7. Long-term use of high-risk medication  - COMPLETE METABOLIC PANEL WITH GFR - Lipid panel - CBC with Differential/Platelet  8. Needs flu shot  - Flu Vaccine QUAD 36+ mos IM

## 2017-01-23 ENCOUNTER — Other Ambulatory Visit: Payer: Self-pay

## 2017-01-23 ENCOUNTER — Telehealth: Payer: Self-pay

## 2017-01-23 DIAGNOSIS — E119 Type 2 diabetes mellitus without complications: Secondary | ICD-10-CM | POA: Diagnosis not present

## 2017-01-23 DIAGNOSIS — F1110 Opioid abuse, uncomplicated: Secondary | ICD-10-CM

## 2017-01-23 DIAGNOSIS — Z79899 Other long term (current) drug therapy: Secondary | ICD-10-CM | POA: Diagnosis not present

## 2017-01-23 NOTE — Telephone Encounter (Signed)
Spoke with patient at this time to remind her that she would need to have her Nicotine and Drug Screen either tomorrow or Wednesday prior to her appointment on Thursday with Dr. Dahlia Byes. Patient did advise me that she is still smoking. I explained to her that she would still need to have the drug screen done. She states that she will come to the medical mall prior to her appointment for this testing to be done.

## 2017-01-24 ENCOUNTER — Other Ambulatory Visit
Admission: RE | Admit: 2017-01-24 | Discharge: 2017-01-24 | Disposition: A | Discharge status: Home / Self Care | Payer: Medicare Other | Source: Ambulatory Visit | Attending: Surgery | Admitting: Surgery

## 2017-01-24 DIAGNOSIS — F1110 Opioid abuse, uncomplicated: Secondary | ICD-10-CM

## 2017-01-24 LAB — CBC WITH DIFFERENTIAL/PLATELET
Basophils Absolute: 43 cells/uL (ref 0–200)
Basophils Relative: 0.7 %
EOS PCT: 2.3 %
Eosinophils Absolute: 140 cells/uL (ref 15–500)
HEMATOCRIT: 43.7 % (ref 35.0–45.0)
Hemoglobin: 14.1 g/dL (ref 11.7–15.5)
LYMPHS ABS: 1415 {cells}/uL (ref 850–3900)
MCH: 26.2 pg — ABNORMAL LOW (ref 27.0–33.0)
MCHC: 32.3 g/dL (ref 32.0–36.0)
MCV: 81.1 fL (ref 80.0–100.0)
MPV: 11.4 fL (ref 7.5–12.5)
Monocytes Relative: 6.4 %
NEUTROS PCT: 67.4 %
Neutro Abs: 4111 cells/uL (ref 1500–7800)
PLATELETS: 265 10*3/uL (ref 140–400)
RBC: 5.39 10*6/uL — AB (ref 3.80–5.10)
RDW: 16.3 % — AB (ref 11.0–15.0)
TOTAL LYMPHOCYTE: 23.2 %
WBC mixed population: 390 cells/uL (ref 200–950)
WBC: 6.1 10*3/uL (ref 3.8–10.8)

## 2017-01-24 LAB — COMPLETE METABOLIC PANEL WITH GFR
AG RATIO: 1.5 (calc) (ref 1.0–2.5)
ALBUMIN MSPROF: 4.3 g/dL (ref 3.6–5.1)
ALT: 17 U/L (ref 6–29)
AST: 16 U/L (ref 10–35)
Alkaline phosphatase (APISO): 103 U/L (ref 33–115)
BILIRUBIN TOTAL: 0.4 mg/dL (ref 0.2–1.2)
BUN: 16 mg/dL (ref 7–25)
CALCIUM: 9.6 mg/dL (ref 8.6–10.2)
CO2: 28 mmol/L (ref 20–32)
Chloride: 104 mmol/L (ref 98–110)
Creat: 0.73 mg/dL (ref 0.50–1.10)
GFR, EST AFRICAN AMERICAN: 112 mL/min/{1.73_m2} (ref >=60)
GFR, Est Non African American: 97 mL/min/{1.73_m2} (ref >=60)
GLOBULIN: 2.8 g/dL (ref 1.9–3.7)
Glucose, Bld: 106 mg/dL — ABNORMAL HIGH (ref 65–99)
POTASSIUM: 4.4 mmol/L (ref 3.5–5.3)
Sodium: 141 mmol/L (ref 135–146)
TOTAL PROTEIN: 7.1 g/dL (ref 6.1–8.1)

## 2017-01-24 LAB — URINE DRUG SCREEN, QUALITATIVE (ARMC ONLY)
Amphetamines, Ur Screen: NOT DETECTED
BARBITURATES, UR SCREEN: NOT DETECTED
BENZODIAZEPINE, UR SCRN: NOT DETECTED
CANNABINOID 50 NG, UR ~~LOC~~: NOT DETECTED
Cocaine Metabolite,Ur ~~LOC~~: NOT DETECTED
MDMA (Ecstasy)Ur Screen: NOT DETECTED
METHADONE SCREEN, URINE: NOT DETECTED
OPIATE, UR SCREEN: NOT DETECTED
PHENCYCLIDINE (PCP) UR S: NOT DETECTED
Tricyclic, Ur Screen: NOT DETECTED

## 2017-01-24 LAB — LIPID PANEL
Cholesterol: 214 mg/dL — ABNORMAL HIGH (ref <200)
HDL: 33 mg/dL — ABNORMAL LOW (ref >50)
LDL Cholesterol (Calc): 148 mg/dL (calc) — ABNORMAL HIGH
NON-HDL CHOLESTEROL (CALC): 181 mg/dL — AB (ref <130)
TRIGLYCERIDES: 194 mg/dL — AB (ref <150)
Total CHOL/HDL Ratio: 6.5 (calc) — ABNORMAL HIGH (ref <5.0)

## 2017-01-24 LAB — INSULIN, FASTING: INSULIN: 19.5 u[IU]/mL (ref 2.0–19.6)

## 2017-01-24 LAB — HEMOGLOBIN A1C
EAG (MMOL/L): 5.7 (calc)
Hgb A1c MFr Bld: 5.2 % of total Hgb (ref <5.7)
Mean Plasma Glucose: 103 (calc)

## 2017-01-26 ENCOUNTER — Encounter: Payer: Self-pay | Admitting: Surgery

## 2017-01-26 ENCOUNTER — Ambulatory Visit (INDEPENDENT_AMBULATORY_CARE_PROVIDER_SITE_OTHER): Payer: Medicare Other | Admitting: Surgery

## 2017-01-26 VITALS — BP 134/93 | HR 82 | Temp 97.7°F | Ht 64.0 in | Wt 248.0 lb

## 2017-01-26 DIAGNOSIS — K432 Incisional hernia without obstruction or gangrene: Secondary | ICD-10-CM | POA: Diagnosis not present

## 2017-01-26 NOTE — Progress Notes (Signed)
Outpatient Surgical Follow Up  01/26/2017  Wendy Johnson is an 51 y.o. female.   Chief Complaint  Patient presents with  . Follow-up    Discuss Possible Periumblicial Hernia (Drug Screening and Nicotine Screening done prior)    HPI: Wendy Johnson is: After a symptomatic incisional hernia. I have encouraged her to stop smoking and also have requested urine toxicology screen. Her urine screen is negative. However she continues to smoke. She continues to have intermittent abdominal pain. She does have a history of chronic pain and is on narcotics managed by pain medicine team. I have personally reviewed her CT scan there is evidence of a small infra umbilical incisional hernia. There is no evidence of incarceration.  Past Medical History:  Diagnosis Date  . ADHD (attention deficit hyperactivity disorder)   . Anxiety   . AR (allergic rhinitis)   . Arthritis   . Benign hypertension   . Chronic back pain   . Chronic constipation   . Chronic insomnia   . COPD (chronic obstructive pulmonary disease) ([place])   . Deaf   . Decreased dorsalis pedis pulse   . Depression   . Dyslipidemia   . Fatty liver   . GERD (gastroesophageal reflux disease)   . Hepatomegaly   . High cholesterol   . Hot flashes   . Migraine with aura   . OCD (obsessive compulsive disorder)   . Plantar warts   . Severe obesity ([place])   . Shortness of breath dyspnea   . Sleep apnea    C-PAP  . Tobacco use   . Trochanteric bursitis of right hip     Past Surgical History:  Procedure Laterality Date  . ABDOMINAL HYSTERECTOMY N/A 10/20/2014   Procedure: Total abdominial hysterectomy, bilateral salpingo-oophorectomy;  Surgeon: Brayton Mars, MD;  Location: [facility];  Service: Gynecology;  Laterality: N/A;  . BILATERAL SALPINGOOPHORECTOMY    . bone spurs removed Bilateral   . COLONOSCOPY WITH PROPOFOL N/A 09/01/2016   Procedure: COLONOSCOPY WITH PROPOFOL;  Surgeon: Jonathon Bellows, MD;  Location: [facility];  Service:  Endoscopy;  Laterality: N/A;  . ESOPHAGOGASTRODUODENOSCOPY (EGD) WITH PROPOFOL N/A 09/01/2016   Procedure: ESOPHAGOGASTRODUODENOSCOPY (EGD) WITH PROPOFOL;  Surgeon: Jonathon Bellows, MD;  Location: [facility];  Service: Endoscopy;  Laterality: N/A;  . FOOT SURGERY Bilateral   . JOINT REPLACEMENT Right    Total Knee replacement X 2  . JOINT REPLACEMENT Bilateral    Total Hip Replacement  . knee arthroscopo Right   . LAPAROSCOPY  09/22/2014   Procedure: LAPAROSCOPY OPERATIVE;  Surgeon: Brayton Mars, MD;  Location: [facility];  Service: Gynecology;;  excision and fulgeration of endomertriosis  . LIPOMA EXCISION    . TONSILLECTOMY    . TOTAL HIP ARTHROPLASTY Left 12/16/2014   Procedure: TOTAL HIP ARTHROPLASTY ANTERIOR APPROACH;  Surgeon: Hessie Knows, MD;  Location: [facility];  Service: Orthopedics;  Laterality: Left;  . TOTAL HIP ARTHROPLASTY Right 05/12/2015   Procedure: TOTAL HIP ARTHROPLASTY ANTERIOR APPROACH;  Surgeon: Hessie Knows, MD;  Location: [facility];  Service: Orthopedics;  Laterality: Right;  . TOTAL KNEE REVISION Right 09/22/2015   Procedure: TOTAL KNEE REVISION/ REVISE POLYIETHYLENE;  Surgeon: Hessie Knows, MD;  Location: [facility];  Service: Orthopedics;  Laterality: Right;  . TOTAL KNEE REVISION Right 10/11/2016   Procedure: TOTAL KNEE REVISION;  Surgeon: Hessie Knows, MD;  Location: [facility];  Service: Orthopedics;  Laterality: Right;  . TUBAL LIGATION      Family History  Problem Relation Age of  Onset  . Diabetes Mother   . Heart disease Father   . Heart attack Father   . Diabetes Sister   . Drug abuse Sister   . Breast cancer Maternal Aunt        <50  . Breast cancer Paternal Aunt        x2.  <50  . Drug abuse Son     Social History:  reports that she has been smoking Cigarettes.  She started smoking about 37 years ago. She has a 18.50 pack-year smoking history. She has never used smokeless tobacco. She reports that she does not drink alcohol or use  drugs.  Allergies:  Allergies  Allergen Reactions  . Codeine Nausea Only  . Imitrex [Sumatriptan] Other (See Comments)    Chest pain    Medications reviewed.    ROS Full ROS performed and is otherwise negative other than what is stated in HPI   BP (!) 134/93   Pulse 82   Temp 97.7 F (36.5 C) (Oral)   Ht 5\' 4"  (1.626 m)   Wt 112.5 kg (248 lb)   LMP 09/26/2014 Comment: Total  BMI 42.57 kg/m    Physical Exam  Constitutional: She is oriented to person, place, and time and well-developed, well-nourished, and in no distress. No distress.  Pulmonary/Chest: Effort normal. No respiratory distress.  Abdominal: Soft. She exhibits no mass. There is tenderness. There is no rebound and no guarding.  10 to palpation in the umbilical region. No peritonitis. No obvious defects noted but this is difficult due to patient body habitus  Musculoskeletal: Normal range of motion.  Neurological: She is alert and oriented to person, place, and time. Gait normal. GCS score is 15.  Skin: Skin is warm. She is not diaphoretic.  Psychiatric: Mood, memory, affect and judgment normal.  Nursing note and vitals reviewed.   Assessment/Plan: Symptomatic incisional hernia in need for repair. Discussed with her again about medical optimization to include smoking cessation and weight loss. I will see her back in a few weeks to determine her smoking cessation status and feasibility of operative intervention at that time.  Greater than 50% of the 25 minutes  visit was spent in counseling/coordination of care   Wendy Hamman, MD [place] Surgeon

## 2017-01-26 NOTE — Patient Instructions (Signed)
We will follow up with you as listed below:  We advised that you quit smoking as soon as possible in order to have your surgery scheduled.  Please contact Dr. Vicente Males for the current symptoms your are experiencing.

## 2017-01-30 ENCOUNTER — Telehealth: Payer: Self-pay | Admitting: Family Medicine

## 2017-01-30 ENCOUNTER — Other Ambulatory Visit: Payer: Self-pay | Admitting: Family Medicine

## 2017-01-30 ENCOUNTER — Telehealth: Payer: Self-pay | Admitting: Surgery

## 2017-01-30 MED ORDER — ATORVASTATIN CALCIUM 40 MG PO TABS: 40.0000 mg | ORAL_TABLET | Freq: Every day | ORAL | 1 refills | Status: DC

## 2017-01-30 NOTE — Telephone Encounter (Signed)
I would prefer if she asked psychiatrist if okay to take Chantix .  There is titration dose, before she can go up to high dose

## 2017-01-30 NOTE — Telephone Encounter (Signed)
Pt states she would like to be put on the higest does of the Chantix.. I explained to the patient she may have to be seen.

## 2017-01-30 NOTE — Telephone Encounter (Signed)
Patient has called and would like to be prescribed something to help her stop smoking. Please call patient to discuss. She states that she has been smoking for 37 years. Dr. Dahlia Byes has discussed with her about smoking cessation prior to scheduling an incisional hernia repair.

## 2017-01-30 NOTE — Telephone Encounter (Signed)
Spoke with patient at this time. She was informed to call Dr. Ancil Boozer for smoking cessation medication or information. She verbalizes understanding.

## 2017-01-30 NOTE — Telephone Encounter (Signed)
Patient was given her labs result via phone on today, 01/30/17 @ 8:14am.  Patient stated that she would like to go ahead and start the statin therapy and for it to be sent to her pharmacy [facility])

## 2017-01-30 NOTE — Telephone Encounter (Signed)
Sent atorvastatin.

## 2017-01-30 NOTE — Telephone Encounter (Signed)
Patient notified

## 2017-01-31 NOTE — Telephone Encounter (Signed)
Pt will call her psychiatrist and then call us back.

## 2017-02-01 LAB — MISC LABCORP TEST (SEND OUT): Labcorp test code: 737919

## 2017-02-27 ENCOUNTER — Telehealth: Payer: Self-pay | Admitting: Family Medicine

## 2017-02-27 NOTE — Telephone Encounter (Signed)
How long has she be taking medication and when did she start having problems? Does she need to come in? We can change to Crestor if she prefers, but not sure if related

## 2017-02-27 NOTE — Telephone Encounter (Signed)
PT SAID THAT SHE CAN NOT TAKE THE LIPITOR ANYMORE FOR IT MAKES HER JOINTS HURT AND IS DIZZY ALL THE TIME. PLEASE ADVISE AS TO WHAT SHE NEEDS TO DO.

## 2017-02-27 NOTE — Telephone Encounter (Signed)
Please advise what to tell patient.

## 2017-02-28 ENCOUNTER — Other Ambulatory Visit: Payer: Self-pay | Admitting: Family Medicine

## 2017-02-28 ENCOUNTER — Telehealth: Payer: Self-pay | Admitting: Family Medicine

## 2017-02-28 DIAGNOSIS — F17200 Nicotine dependence, unspecified, uncomplicated: Secondary | ICD-10-CM | POA: Diagnosis not present

## 2017-02-28 DIAGNOSIS — F901 Attention-deficit hyperactivity disorder, predominantly hyperactive type: Secondary | ICD-10-CM | POA: Diagnosis not present

## 2017-02-28 DIAGNOSIS — Z79899 Other long term (current) drug therapy: Secondary | ICD-10-CM | POA: Diagnosis not present

## 2017-02-28 DIAGNOSIS — F3189 Other bipolar disorder: Secondary | ICD-10-CM | POA: Diagnosis not present

## 2017-02-28 DIAGNOSIS — F4310 Post-traumatic stress disorder, unspecified: Secondary | ICD-10-CM | POA: Diagnosis not present

## 2017-02-28 DIAGNOSIS — F410 Panic disorder [episodic paroxysmal anxiety] without agoraphobia: Secondary | ICD-10-CM | POA: Diagnosis not present

## 2017-02-28 DIAGNOSIS — F902 Attention-deficit hyperactivity disorder, combined type: Secondary | ICD-10-CM | POA: Diagnosis not present

## 2017-02-28 MED ORDER — ROSUVASTATIN CALCIUM 10 MG PO TABS: 10.0000 mg | ORAL_TABLET | Freq: Every day | ORAL | 0 refills | Status: DC

## 2017-02-28 NOTE — Telephone Encounter (Signed)
Copied from [place] [id number]. Topic: Quick Communication - See Telephone Encounter >> Feb 28, 2017 10:31 AM Bea Graff, NT wrote: CRM for notification. See Telephone encounter for:  02/28/17. Patient called again checking on changing her lipitor.

## 2017-02-28 NOTE — Telephone Encounter (Signed)
Patient states she has been on Lipitor for a month and a half. But from the first day she took it she was dizzy headed, but after 2 weeks her joints start hurting. Could you please call the Crestor into her pharmacy. Thanks.

## 2017-02-28 NOTE — Telephone Encounter (Signed)
Sent crestor, but needs to follow up if no resolution of dizziness and muscle aches

## 2017-03-01 ENCOUNTER — Other Ambulatory Visit: Payer: Self-pay

## 2017-03-01 ENCOUNTER — Ambulatory Visit: Payer: Medicare Other | Admitting: Surgery

## 2017-03-03 ENCOUNTER — Encounter: Payer: Self-pay | Admitting: Surgery

## 2017-03-03 ENCOUNTER — Ambulatory Visit (INDEPENDENT_AMBULATORY_CARE_PROVIDER_SITE_OTHER): Payer: Medicare Other | Admitting: Surgery

## 2017-03-03 VITALS — BP 130/88 | HR 80 | Temp 97.3°F | Ht 64.0 in | Wt 255.4 lb

## 2017-03-03 DIAGNOSIS — K432 Incisional hernia without obstruction or gangrene: Secondary | ICD-10-CM

## 2017-03-03 NOTE — Patient Instructions (Signed)
Please see your appointment listed below. 

## 2017-03-06 NOTE — Progress Notes (Signed)
Outpatient Surgical Follow Up  03/06/2017  Wendy Johnson is an 51 y.o. female.   Chief Complaint  Patient presents with  . Follow-up    discuss possible Periumbilical Hernia    HPI: Following up for ventral hernia, she actually gained more weight. Quit smoking but went back to smoke again. Last cig was about 10 days ago. She has some intermittent pain, no evidence of incarceration or strangulation  Past Medical History:  Diagnosis Date  . ADHD (attention deficit hyperactivity disorder)   . Anxiety   . AR (allergic rhinitis)   . Arthritis   . Benign hypertension   . Chronic back pain   . Chronic constipation   . Chronic insomnia   . COPD (chronic obstructive pulmonary disease) ([place])   . Deaf   . Decreased dorsalis pedis pulse   . Depression   . Dyslipidemia   . Fatty liver   . GERD (gastroesophageal reflux disease)   . Hepatomegaly   . High cholesterol   . Hot flashes   . Migraine with aura   . OCD (obsessive compulsive disorder)   . Plantar warts   . Severe obesity ([place])   . Shortness of breath dyspnea   . Sleep apnea    C-PAP  . Tobacco use   . Trochanteric bursitis of right hip     Past Surgical History:  Procedure Laterality Date  . ABDOMINAL HYSTERECTOMY N/A 10/20/2014   Procedure: Total abdominial hysterectomy, bilateral salpingo-oophorectomy;  Surgeon: Brayton Mars, MD;  Location: [facility];  Service: Gynecology;  Laterality: N/A;  . BILATERAL SALPINGOOPHORECTOMY    . bone spurs removed Bilateral   . COLONOSCOPY WITH PROPOFOL N/A 09/01/2016   Procedure: COLONOSCOPY WITH PROPOFOL;  Surgeon: Jonathon Bellows, MD;  Location: [facility];  Service: Endoscopy;  Laterality: N/A;  . ESOPHAGOGASTRODUODENOSCOPY (EGD) WITH PROPOFOL N/A 09/01/2016   Procedure: ESOPHAGOGASTRODUODENOSCOPY (EGD) WITH PROPOFOL;  Surgeon: Jonathon Bellows, MD;  Location: [facility];  Service: Endoscopy;  Laterality: N/A;  . FOOT SURGERY Bilateral   . JOINT REPLACEMENT Right    Total  Knee replacement X 2  . JOINT REPLACEMENT Bilateral    Total Hip Replacement  . knee arthroscopo Right   . LAPAROSCOPY  09/22/2014   Procedure: LAPAROSCOPY OPERATIVE;  Surgeon: Brayton Mars, MD;  Location: [facility];  Service: Gynecology;;  excision and fulgeration of endomertriosis  . LIPOMA EXCISION    . TONSILLECTOMY    . TOTAL HIP ARTHROPLASTY Left 12/16/2014   Procedure: TOTAL HIP ARTHROPLASTY ANTERIOR APPROACH;  Surgeon: Hessie Knows, MD;  Location: [facility];  Service: Orthopedics;  Laterality: Left;  . TOTAL HIP ARTHROPLASTY Right 05/12/2015   Procedure: TOTAL HIP ARTHROPLASTY ANTERIOR APPROACH;  Surgeon: Hessie Knows, MD;  Location: [facility];  Service: Orthopedics;  Laterality: Right;  . TOTAL KNEE REVISION Right 09/22/2015   Procedure: TOTAL KNEE REVISION/ REVISE POLYIETHYLENE;  Surgeon: Hessie Knows, MD;  Location: [facility];  Service: Orthopedics;  Laterality: Right;  . TOTAL KNEE REVISION Right 10/11/2016   Procedure: TOTAL KNEE REVISION;  Surgeon: Hessie Knows, MD;  Location: [facility];  Service: Orthopedics;  Laterality: Right;  . TUBAL LIGATION      Family History  Problem Relation Age of Onset  . Diabetes Mother   . Heart disease Father   . Heart attack Father   . Diabetes Sister   . Drug abuse Sister   . Breast cancer Maternal Aunt        <50  . Breast cancer Paternal Aunt  x2.  <50  . Drug abuse Son     Social History:  reports that she has been smoking Cigarettes.  She started smoking about 37 years ago. She has a 18.50 pack-year smoking history. She has never used smokeless tobacco. She reports that she does not drink alcohol or use drugs.  Allergies:  Allergies  Allergen Reactions  . Codeine Nausea Only  . Imitrex [Sumatriptan] Other (See Comments)    Chest pain    Medications reviewed.    ROS Full ROS performed and is otherwise negative other than what is stated in HPI   BP 130/88   Pulse 80   Temp (!) 97.3 F (36.3 C) (Oral)   Ht 5\' 4"   (1.626 m)   Wt 115.8 kg (255 lb 6.4 oz)   LMP 09/26/2014 Comment: Total  BMI 43.84 kg/m   Physical Exam  Constitutional: She is oriented to person, place, and time and well-developed, well-nourished, and in no distress. No distress.  Neck: No JVD present. No tracheal deviation present.  Pulmonary/Chest: Effort normal. No stridor. No respiratory distress.  Abdominal: Soft. She exhibits no distension. There is no tenderness. There is no rebound and no guarding.  Reducible ventral hernia, epigastrci area, no peritonitis  Neurological: She is alert and oriented to person, place, and time. Gait normal. GCS score is 15.  Skin: Skin is warm and dry.  Psychiatric: Mood, memory, affect and judgment normal.  Nursing note and vitals reviewed.     Assessment/Plan: Sympt ventral hernia. Need better optimization and true smoking cessation with weight loss. D/W the pt ion detail about my thought process. She is in agreement. We will see her back 4 weeks  Caroleen Hamman, MD [place] Surgeon

## 2017-03-10 ENCOUNTER — Encounter: Payer: Self-pay | Admitting: Anesthesiology

## 2017-03-10 ENCOUNTER — Ambulatory Visit: Payer: Medicare Other | Attending: Anesthesiology | Admitting: Anesthesiology

## 2017-03-10 VITALS — BP 153/97 | HR 94 | Resp 16 | Ht 64.0 in | Wt 245.0 lb

## 2017-03-10 DIAGNOSIS — Z90722 Acquired absence of ovaries, bilateral: Secondary | ICD-10-CM | POA: Insufficient documentation

## 2017-03-10 DIAGNOSIS — M8949 Other hypertrophic osteoarthropathy, multiple sites: Secondary | ICD-10-CM

## 2017-03-10 DIAGNOSIS — Z79899 Other long term (current) drug therapy: Secondary | ICD-10-CM | POA: Diagnosis not present

## 2017-03-10 DIAGNOSIS — M150 Primary generalized (osteo)arthritis: Secondary | ICD-10-CM

## 2017-03-10 DIAGNOSIS — Z79891 Long term (current) use of opiate analgesic: Secondary | ICD-10-CM | POA: Diagnosis not present

## 2017-03-10 DIAGNOSIS — Z90710 Acquired absence of both cervix and uterus: Secondary | ICD-10-CM | POA: Insufficient documentation

## 2017-03-10 DIAGNOSIS — M1990 Unspecified osteoarthritis, unspecified site: Secondary | ICD-10-CM | POA: Diagnosis not present

## 2017-03-10 DIAGNOSIS — G8918 Other acute postprocedural pain: Secondary | ICD-10-CM | POA: Diagnosis not present

## 2017-03-10 DIAGNOSIS — M545 Low back pain, unspecified: Secondary | ICD-10-CM

## 2017-03-10 DIAGNOSIS — M5136 Other intervertebral disc degeneration, lumbar region: Secondary | ICD-10-CM | POA: Diagnosis not present

## 2017-03-10 DIAGNOSIS — M47816 Spondylosis without myelopathy or radiculopathy, lumbar region: Secondary | ICD-10-CM | POA: Diagnosis not present

## 2017-03-10 DIAGNOSIS — M4696 Unspecified inflammatory spondylopathy, lumbar region: Secondary | ICD-10-CM | POA: Diagnosis not present

## 2017-03-10 DIAGNOSIS — F1190 Opioid use, unspecified, uncomplicated: Secondary | ICD-10-CM

## 2017-03-10 DIAGNOSIS — M25562 Pain in left knee: Secondary | ICD-10-CM | POA: Diagnosis not present

## 2017-03-10 DIAGNOSIS — M25561 Pain in right knee: Secondary | ICD-10-CM | POA: Diagnosis not present

## 2017-03-10 DIAGNOSIS — Z842 Family history of other diseases of the genitourinary system: Secondary | ICD-10-CM

## 2017-03-10 DIAGNOSIS — R1033 Periumbilical pain: Secondary | ICD-10-CM | POA: Diagnosis not present

## 2017-03-10 DIAGNOSIS — Z9079 Acquired absence of other genital organ(s): Secondary | ICD-10-CM | POA: Insufficient documentation

## 2017-03-10 DIAGNOSIS — M542 Cervicalgia: Secondary | ICD-10-CM

## 2017-03-10 DIAGNOSIS — M159 Polyosteoarthritis, unspecified: Secondary | ICD-10-CM

## 2017-03-10 MED ORDER — NALOXONE HCL 4 MG/0.1ML NA LIQD: NASAL | 2 refills | Status: DC

## 2017-03-10 MED ORDER — OXYCODONE HCL 10 MG PO TABS: 10.0000 mg | ORAL_TABLET | ORAL | 0 refills | Status: DC

## 2017-03-10 MED ORDER — OXYCODONE HCL 10 MG PO TABS: 10.0000 mg | ORAL_TABLET | Freq: Four times a day (QID) | ORAL | 0 refills | Status: DC

## 2017-03-10 NOTE — Progress Notes (Signed)
Subjective:  Patient ID: Wendy Johnson, female    DOB: Oct 07, 1965  Age: 51 y.o. MRN: [id number]  CC: Joint Pain (arthritis) and Abdominal Pain (hernia)   Procedure: None  HPI [name] presents for return evaluation.  She was last seen a few months ago.  Since that time she is seen Dr. Dahlia Byes for evaluation of an umbilical hernia that has been quite troublesome.  She has had significant pain and spasming in the mid lower abdominal region.  She is scheduled for an umbilical hernia repair in the next few weeks.  He is unwilling to provide opioids secondary to her relationship with the pain management center.  She continues to have diffuse body pain with diffuse arthritis with chronic low back pain and bilateral knee and hip pain.  Medications she takes for this are effective for her and based on her narcotic assessment sheet she continues to derive good functional lifestyle improvement with her medications.  No change in lower extremity strength and function are noted no changes in bowel or bladder function.  The primary additional pain she experiences right now is with the mid abdominal pain.  Otherwise she is in her usual state of health.  Outpatient Medications Prior to Visit  Medication Sig Dispense Refill  . amphetamine-dextroamphetamine (ADDERALL) 30 MG tablet Take 30 mg by mouth 2 (two) times daily.    . ARIPiprazole (ABILIFY) 15 MG tablet Take 15 mg by mouth at bedtime.    . busPIRone (BUSPAR) 30 MG tablet Take 30 mg by mouth every evening.     . clonazePAM (KLONOPIN) 1 MG tablet Take 1-2 mg by mouth 3 (three) times daily as needed for anxiety (takes 2 every  morning and then 1-2 tablets as needed during the day for anxiety).     . cyclobenzaprine (FLEXERIL) 10 MG tablet TAKE 1 TABLET BY MOUTH TWICE A DAY 60 tablet 2  . dexlansoprazole (DEXILANT) 60 MG capsule Take 1 capsule (60 mg total) by mouth daily. 30 capsule 5  . doxepin (SINEQUAN) 50 MG capsule Take 50 mg by mouth at bedtime.      . hydrOXYzine (ATARAX/VISTARIL) 25 MG tablet Take 25 mg by mouth daily as needed for anxiety or itching (hives).     . polyethylene glycol (MIRALAX / GLYCOLAX) packet Take 17 g by mouth daily. 14 each 3  . rosuvastatin (CRESTOR) 10 MG tablet Take 1 tablet (10 mg total) by mouth daily. 30 tablet 0  . Semaglutide (OZEMPIC) 0.25 or 0.5 MG/DOSE SOPN Inject 0.5 mg into the skin once a week. 2 pen 2  . sertraline (ZOLOFT) 100 MG tablet Take 100 mg by mouth 2 (two) times daily.     . traZODone (DESYREL) 150 MG tablet Take 300 mg by mouth at bedtime.     Marland Kitchen umeclidinium-vilanterol (ANORO ELLIPTA) 62.5-25 MCG/INH AEPB Inhale 1 puff into the lungs daily. 60 each 5  . Oxycodone HCl 10 MG TABS Take 1 tablet (10 mg total) by mouth QID. 120 tablet 0   No facility-administered medications prior to visit.     Review of Systems CNS: No sedation or confusion Cardiac: No angina or palpitations GI: No constipation or abdominal pain but new onset low anterior abdominal pain  Objective:  BP (!) 153/97 (BP Location: Left Arm, Patient Position: Sitting, Cuff Size: Large)   Pulse 94   Resp 16   Ht 5' 4"  (1.626 m)   Wt 245 lb (111.1 kg)   LMP 09/26/2014 Comment: Total  SpO2 100%   BMI 42.05 kg/m    BP Readings from Last 3 Encounters:  03/10/17 (!) 153/97  03/03/17 130/88  01/26/17 (!) 134/93     Wt Readings from Last 3 Encounters:  03/10/17 245 lb (111.1 kg)  03/03/17 255 lb 6.4 oz (115.8 kg)  01/26/17 248 lb (112.5 kg)     Physical Exam Pt is alert and oriented PERRL EOMI HEART IS RRR no murmur or rub LCTA no wheezing or rhales MUSCULOSKELETAL reveals good muscle tone and bulk throughout the lower extremities with baseline strength and an antalgic gait.  She does have some tenderness in the low abdominal region.  Labs  Lab Results  Component Value Date   HGBA1C 5.2 01/23/2017   HGBA1C 5.5 08/29/2016   HGBA1C 5.3 07/28/2016   Lab Results  Component Value Date   MICROALBUR 20  07/28/2016   LDLCALC NOT CALC 07/28/2016   CREATININE 0.73 01/23/2017    -------------------------------------------------------------------------------------------------------------------- Lab Results  Component Value Date   WBC 6.1 01/23/2017   HGB 14.1 01/23/2017   HCT 43.7 01/23/2017   PLT 265 01/23/2017   GLUCOSE 106 (H) 01/23/2017   CHOL 214 (H) 01/23/2017   TRIG 194 (H) 01/23/2017   HDL 33 (L) 01/23/2017   LDLCALC NOT CALC 07/28/2016   ALT 17 01/23/2017   AST 16 01/23/2017   NA 141 01/23/2017   K 4.4 01/23/2017   CL 104 01/23/2017   CREATININE 0.73 01/23/2017   BUN 16 01/23/2017   CO2 28 01/23/2017   TSH 0.769 02/03/2016   INR 0.97 10/04/2016   HGBA1C 5.2 01/23/2017   MICROALBUR 20 07/28/2016    --------------------------------------------------------------------------------------------------------------------- No results found.   Assessment & Plan:   Aamina was seen today for joint pain and abdominal pain.  Diagnoses and all orders for this visit:  Cervicalgia  Primary osteoarthritis involving multiple joints  Low back pain at multiple sites  Facet arthritis of lumbar region (HCC)  Chronic, continuous use of opioids  DDD (degenerative disc disease), lumbar  Periumbilical abdominal pain  Post-op pain -     Discontinue: Oxycodone HCl 10 MG TABS; Take 1 tablet (10 mg total) by mouth QID. -     Oxycodone HCl 10 MG TABS; Take 1 tablet (10 mg total) by mouth every 4 (four) hours while awake. -     Oxycodone HCl 10 MG TABS; Take 1 tablet (10 mg total) by mouth every 4 (four) hours while awake.  FH: TAH-BSO (total abdominal hysterectomy and bilateral salpingo-oophorectomy) -     Discontinue: Oxycodone HCl 10 MG TABS; Take 1 tablet (10 mg total) by mouth QID. -     Oxycodone HCl 10 MG TABS; Take 1 tablet (10 mg total) by mouth every 4 (four) hours while awake. -     Oxycodone HCl 10 MG TABS; Take 1 tablet (10 mg total) by mouth every 4 (four) hours while  awake.  Other orders -     naloxone (NARCAN) nasal spray 4 mg/0.1 mL; For respiratory suppression from opioids        ----------------------------------------------------------------------------------------------------------------------  Problem List Items Addressed This Visit      Unprioritized   DDD (degenerative disc disease), lumbar   Relevant Medications   Oxycodone HCl 10 MG TABS   Oxycodone HCl 10 MG TABS   Facet arthritis of lumbar region ([place])   Relevant Medications   Oxycodone HCl 10 MG TABS   Oxycodone HCl 10 MG TABS   Primary osteoarthritis involving multiple joints  Relevant Medications   Oxycodone HCl 10 MG TABS   Oxycodone HCl 10 MG TABS    Other Visit Diagnoses    Cervicalgia    -  Primary   Low back pain at multiple sites       Relevant Medications   Oxycodone HCl 10 MG TABS   Oxycodone HCl 10 MG TABS   Chronic, continuous use of opioids       Periumbilical abdominal pain       Post-op pain       Relevant Medications   Oxycodone HCl 10 MG TABS   Oxycodone HCl 10 MG TABS   FH: TAH-BSO (total abdominal hysterectomy and bilateral salpingo-oophorectomy)       Relevant Medications   Oxycodone HCl 10 MG TABS   Oxycodone HCl 10 MG TABS        ----------------------------------------------------------------------------------------------------------------------  1. Cervicalgia Continue with stretching strengthening exercises for her neck and upper extremity strength  2. Primary osteoarthritis involving multiple joints We will refill her medications for the oxycodone 10 mg tablets.  I am going to allow her 1 additional tablet per day for breakthrough pain that she is describing.  About a long discussion with her regarding the risk of interaction with opioids and benzodiazepines and risk for respiration sedation.  I am giving her a prescription for Narcan which I have encouraged her to fill.  If she should have any problems she is to contact us at the pain  control center.  Or at the emergency center for immediate assistance.  She understands this.  We will do this additional 30 tablets/month for November 02 and December 02.  She is to return to clinic in 2 months.  3. Low back pain at multiple sites   4. Facet arthritis of lumbar region ([place])   5. Chronic, continuous use of opioids As above.  We reviewed the [facility] practitioner database information and it is appropriate.  6. DDD (degenerative disc disease), lumbar   7. Periumbilical abdominal pain   8. Post-op pain  - Oxycodone HCl 10 MG TABS; Take 1 tablet (10 mg total) by mouth every 4 (four) hours while awake.  Dispense: 150 tablet; Refill: 0 - Oxycodone HCl 10 MG TABS; Take 1 tablet (10 mg total) by mouth every 4 (four) hours while awake.  Dispense: 150 tablet; Refill: 0  9. FH: TAH-BSO (total abdominal hysterectomy and bilateral salpingo-oophorectomy)  - Oxycodone HCl 10 MG TABS; Take 1 tablet (10 mg total) by mouth every 4 (four) hours while awake.  Dispense: 150 tablet; Refill: 0 - Oxycodone HCl 10 MG TABS; Take 1 tablet (10 mg total) by mouth every 4 (four) hours while awake.  Dispense: 150 tablet; Refill: 0    ----------------------------------------------------------------------------------------------------------------------  I have discontinued Ms. Mcclintic's Oxycodone HCl. I have also changed her Oxycodone HCl and Oxycodone HCl. Additionally, I am having her start on naloxone. Lastly, I am having her maintain her ARIPiprazole, amphetamine-dextroamphetamine, traZODone, clonazePAM, busPIRone, sertraline, hydrOXYzine, cyclobenzaprine, polyethylene glycol, dexlansoprazole, umeclidinium-vilanterol, Semaglutide, rosuvastatin, and doxepin.   Meds ordered this encounter  Medications  . DISCONTD: Oxycodone HCl 10 MG TABS    Sig: Take 1 tablet (10 mg total) by mouth QID.    Dispense:  150 tablet    Refill:  0  . Oxycodone HCl 10 MG TABS    Sig: Take 1 tablet (10 mg  total) by mouth every 4 (four) hours while awake.    Dispense:  150 tablet    Refill:  0  Do not fill until [id number]  . Oxycodone HCl 10 MG TABS    Sig: Take 1 tablet (10 mg total) by mouth every 4 (four) hours while awake.    Dispense:  150 tablet    Refill:  0  . naloxone (NARCAN) nasal spray 4 mg/0.1 mL    Sig: For respiratory suppression from opioids    Dispense:  1 kit    Refill:  2   Patient's Medications  New Prescriptions   NALOXONE (NARCAN) NASAL SPRAY 4 MG/0.1 ML    For respiratory suppression from opioids  Previous Medications   AMPHETAMINE-DEXTROAMPHETAMINE (ADDERALL) 30 MG TABLET    Take 30 mg by mouth 2 (two) times daily.   ARIPIPRAZOLE (ABILIFY) 15 MG TABLET    Take 15 mg by mouth at bedtime.   BUSPIRONE (BUSPAR) 30 MG TABLET    Take 30 mg by mouth every evening.    CLONAZEPAM (KLONOPIN) 1 MG TABLET    Take 1-2 mg by mouth 3 (three) times daily as needed for anxiety (takes 2 every  morning and then 1-2 tablets as needed during the day for anxiety).    CYCLOBENZAPRINE (FLEXERIL) 10 MG TABLET    TAKE 1 TABLET BY MOUTH TWICE A DAY   DEXLANSOPRAZOLE (DEXILANT) 60 MG CAPSULE    Take 1 capsule (60 mg total) by mouth daily.   DOXEPIN (SINEQUAN) 50 MG CAPSULE    Take 50 mg by mouth at bedtime.    HYDROXYZINE (ATARAX/VISTARIL) 25 MG TABLET    Take 25 mg by mouth daily as needed for anxiety or itching (hives).    POLYETHYLENE GLYCOL (MIRALAX / GLYCOLAX) PACKET    Take 17 g by mouth daily.   ROSUVASTATIN (CRESTOR) 10 MG TABLET    Take 1 tablet (10 mg total) by mouth daily.   SEMAGLUTIDE (OZEMPIC) 0.25 OR 0.5 MG/DOSE SOPN    Inject 0.5 mg into the skin once a week.   SERTRALINE (ZOLOFT) 100 MG TABLET    Take 100 mg by mouth 2 (two) times daily.    TRAZODONE (DESYREL) 150 MG TABLET    Take 300 mg by mouth at bedtime.    UMECLIDINIUM-VILANTEROL (ANORO ELLIPTA) 62.5-25 MCG/INH AEPB    Inhale 1 puff into the lungs daily.  Modified Medications   Modified Medication Previous Medication    OXYCODONE HCL 10 MG TABS Oxycodone HCl 10 MG TABS      Take 1 tablet (10 mg total) by mouth every 4 (four) hours while awake.    Take 1 tablet (10 mg total) by mouth QID.   OXYCODONE HCL 10 MG TABS Oxycodone HCl 10 MG TABS      Take 1 tablet (10 mg total) by mouth every 4 (four) hours while awake.    Take 1 tablet (10 mg total) by mouth QID.  Discontinued Medications   No medications on file   ----------------------------------------------------------------------------------------------------------------------  Follow-up: Return in about 2 months (around 05/10/2017) for evaluation, med refill.    Molli Barrows, MD

## 2017-03-10 NOTE — Progress Notes (Signed)
Nursing Pain Medication Assessment:  Safety precautions to be maintained throughout the outpatient stay will include: orient to surroundings, keep bed in low position, maintain call bell within reach at all times, provide assistance with transfer out of bed and ambulation.  Medication Inspection Compliance: Pill count conducted under aseptic conditions, in front of the patient. Neither the pills nor the bottle was removed from the patient's sight at any time. Once count was completed pills were immediately returned to the patient in their original bottle.  Medication: Oxycodone IR Pill/Patch Count: 6 of 120 pills remain Pill/Patch Appearance: Markings consistent with prescribed medication Bottle Appearance: Standard pharmacy container. Clearly labeled. Filled Date: 10 / 04 / 2018 Last Medication intake:  Today

## 2017-03-28 ENCOUNTER — Other Ambulatory Visit: Payer: Self-pay | Admitting: Anesthesiology

## 2017-04-03 ENCOUNTER — Telehealth: Payer: Self-pay

## 2017-04-03 NOTE — Telephone Encounter (Signed)
Pharmacist notified that ok to fill prescription one day early.

## 2017-04-05 ENCOUNTER — Ambulatory Visit: Payer: Medicare Other | Admitting: Surgery

## 2017-04-07 ENCOUNTER — Other Ambulatory Visit: Payer: Self-pay

## 2017-04-07 NOTE — Telephone Encounter (Signed)
Refill Request for Cholesterol medication. Rosuvastatin Calcium 10 mg  Last physical: 01/19/2017  Lab Results  Component Value Date   CHOL 214 (H) 01/23/2017   HDL 33 (L) 01/23/2017   LDLCALC NOT CALC 07/28/2016   TRIG 194 (H) 01/23/2017   CHOLHDL 6.5 (H) 01/23/2017    Follow up visit: 04/21/2017

## 2017-04-10 MED ORDER — ROSUVASTATIN CALCIUM 10 MG PO TABS: 10.0000 mg | ORAL_TABLET | Freq: Every day | ORAL | 7 refills | Status: DC

## 2017-04-17 ENCOUNTER — Ambulatory Visit: Payer: Medicare Other | Admitting: Surgery

## 2017-04-20 DIAGNOSIS — G5601 Carpal tunnel syndrome, right upper limb: Secondary | ICD-10-CM | POA: Diagnosis not present

## 2017-04-21 ENCOUNTER — Encounter: Payer: Self-pay | Admitting: Family Medicine

## 2017-04-21 ENCOUNTER — Ambulatory Visit (INDEPENDENT_AMBULATORY_CARE_PROVIDER_SITE_OTHER): Payer: Medicare Other | Admitting: Family Medicine

## 2017-04-21 VITALS — BP 100/72 | HR 68 | Resp 14 | Ht 64.0 in | Wt 252.7 lb

## 2017-04-21 DIAGNOSIS — F3131 Bipolar disorder, current episode depressed, mild: Secondary | ICD-10-CM

## 2017-04-21 DIAGNOSIS — J441 Chronic obstructive pulmonary disease with (acute) exacerbation: Secondary | ICD-10-CM | POA: Diagnosis not present

## 2017-04-21 DIAGNOSIS — E119 Type 2 diabetes mellitus without complications: Secondary | ICD-10-CM | POA: Diagnosis not present

## 2017-04-21 DIAGNOSIS — R1033 Periumbilical pain: Secondary | ICD-10-CM | POA: Diagnosis not present

## 2017-04-21 DIAGNOSIS — J449 Chronic obstructive pulmonary disease, unspecified: Secondary | ICD-10-CM

## 2017-04-21 DIAGNOSIS — K219 Gastro-esophageal reflux disease without esophagitis: Secondary | ICD-10-CM | POA: Diagnosis not present

## 2017-04-21 DIAGNOSIS — E1169 Type 2 diabetes mellitus with other specified complication: Secondary | ICD-10-CM

## 2017-04-21 DIAGNOSIS — Z1231 Encounter for screening mammogram for malignant neoplasm of breast: Secondary | ICD-10-CM

## 2017-04-21 DIAGNOSIS — E785 Hyperlipidemia, unspecified: Secondary | ICD-10-CM

## 2017-04-21 DIAGNOSIS — Z6841 Body Mass Index (BMI) 40.0 and over, adult: Secondary | ICD-10-CM

## 2017-04-21 LAB — POCT GLYCOSYLATED HEMOGLOBIN (HGB A1C): HEMOGLOBIN A1C: 5.4

## 2017-04-21 MED ORDER — TIOTROPIUM BROMIDE MONOHYDRATE 18 MCG IN CAPS
18.0000 ug | ORAL_CAPSULE | Freq: Every day | RESPIRATORY_TRACT | 5 refills | Status: DC
Start: 2017-04-21 — End: 2017-07-19

## 2017-04-21 MED ORDER — PREDNISONE 20 MG PO TABS: 20.0000 mg | ORAL_TABLET | Freq: Two times a day (BID) | ORAL | 0 refills | Status: DC

## 2017-04-21 NOTE — Progress Notes (Signed)
Name: Wendy Johnson   MRN: [id number]    DOB: 1966-04-01   Date:04/21/2017       Progress Note  Subjective  Chief Complaint  Chief Complaint  Patient presents with  . Abdominal Pain  . Diabetes  . Gastroesophageal Reflux  . Obesity    HPI  DM: diet controlled with dyslipidemia:  needs yearly eye exam. Denies polyphagia, polydipsia or polyuria. She still has low HDL, LDL elevated and triglycerides up, hgbA1C is at goal. She is not on medications for DM at this time  Dyslipidemia:  She has high triglycerides , low HDl and high LDL, she is taking statin therapy, and trying life style modification  DDD lumbar spine: she sees pain clini - Dr Andree Elk, on Flexeril and also Oxycodone, pain level is 6/10 at this time, pain is on her back, neck some on shoulder and right knee. Highest pain today is on periumbilical area ( 1/32)  GERD: taking Dexilant and seen by Dr. Vicente Males, no longer has heartburn or regurgitation, very seldom has symptoms.   Morbid obesity: her maximum weight was around 271 lbs, she lost down to 184 lbs with physical activity and life style modification, but over the past couple of years she started to gain her weight back, started after knee and hip surgery. She is back on life style modification, off sodas, drinking Slim fast, water, eating salads and has been unable to lose weight. She was on Ozempic but stopped on her own. She asked about oral medications, explained not covered by insurance and advised her to seek care at local bariatric center.   Bipolar disorder: on multiple medications , given by Dr. Kasandra Knudsen   COPD flare: she switched back from Anoro ( did not like the powder sensation in her mouth) to Spiriva, and was doing well, still uses oxygen at night, however had an URI about 2 weeks ago and since than has increase in sputum production that is dark in color, noticed wheezing but no SOB. She has been afebrile, not taking otc medication. We will try prednisone, discussed  possible side effects and the need to take it with food.   Patient Active Problem List   Diagnosis Date Noted  . Painful total knee replacement, right ([place]) 10/11/2016  . Rotator cuff tendinitis, right 07/29/2016  . Occipital neuralgia 10/12/2015  . Medication overuse headache 10/12/2015  . Instability of prosthetic knee ([place]) 09/22/2015  . Primary osteoarthritis of right hip 05/12/2015  . Sleep apnea 04/07/2015  . Primary osteoarthritis of left hip 12/16/2014  . Metabolic syndrome 44/05/270  . Migraine without aura and without status migrainosus, not intractable 11/26/2014  . GERD without esophagitis 11/26/2014  . COPD, moderate ([place]) 11/26/2014  . Nocturnal oxygen desaturation 11/26/2014  . Supplemental oxygen dependent 11/26/2014  . Hearing loss 11/26/2014  . Chronic insomnia 11/26/2014  . History of hypertension 11/26/2014  . Dyslipidemia 11/26/2014  . Morbid obesity ([place]) 11/26/2014  . Diabetes mellitus type 2, diet-controlled ([place]) 11/26/2014  . Chronic constipation 11/26/2014  . Generalized anxiety disorder 11/11/2014  . DDD (degenerative disc disease), lumbar 11/11/2014  . Facet arthritis of lumbar region ([place]) 11/11/2014  . Primary osteoarthritis involving multiple joints 11/11/2014  . H/O hysterectomy for benign disease 10/20/2014  . History of endometriosis 10/16/2014  . Low back derangement syndrome 09/30/2014  . Degenerative joint disease of both lower legs 09/30/2014  . Bipolar disorder ([place]) 09/30/2014    Past Surgical History:  Procedure Laterality Date  . ABDOMINAL HYSTERECTOMY N/A 10/20/2014  Procedure: Total abdominial hysterectomy, bilateral salpingo-oophorectomy;  Surgeon: Brayton Mars, MD;  Location: [facility] ORS;  Service: Gynecology;  Laterality: N/A;  . BILATERAL SALPINGOOPHORECTOMY    . bone spurs removed Bilateral   . COLONOSCOPY WITH PROPOFOL N/A 09/01/2016   Procedure: COLONOSCOPY WITH PROPOFOL;  Surgeon: Jonathon Bellows, MD;  Location: [facility]  ENDOSCOPY;  Service: Endoscopy;  Laterality: N/A;  . ESOPHAGOGASTRODUODENOSCOPY (EGD) WITH PROPOFOL N/A 09/01/2016   Procedure: ESOPHAGOGASTRODUODENOSCOPY (EGD) WITH PROPOFOL;  Surgeon: Jonathon Bellows, MD;  Location: [facility] ENDOSCOPY;  Service: Endoscopy;  Laterality: N/A;  . FOOT SURGERY Bilateral   . JOINT REPLACEMENT Right    Total Knee replacement X 2  . JOINT REPLACEMENT Bilateral    Total Hip Replacement  . knee arthroscopo Right   . LAPAROSCOPY  09/22/2014   Procedure: LAPAROSCOPY OPERATIVE;  Surgeon: Brayton Mars, MD;  Location: [facility] ORS;  Service: Gynecology;;  excision and fulgeration of endomertriosis  . LIPOMA EXCISION    . TONSILLECTOMY    . TOTAL HIP ARTHROPLASTY Left 12/16/2014   Procedure: TOTAL HIP ARTHROPLASTY ANTERIOR APPROACH;  Surgeon: Hessie Knows, MD;  Location: [facility] ORS;  Service: Orthopedics;  Laterality: Left;  . TOTAL HIP ARTHROPLASTY Right 05/12/2015   Procedure: TOTAL HIP ARTHROPLASTY ANTERIOR APPROACH;  Surgeon: Hessie Knows, MD;  Location: [facility] ORS;  Service: Orthopedics;  Laterality: Right;  . TOTAL KNEE REVISION Right 09/22/2015   Procedure: TOTAL KNEE REVISION/ REVISE POLYIETHYLENE;  Surgeon: Hessie Knows, MD;  Location: [facility] ORS;  Service: Orthopedics;  Laterality: Right;  . TOTAL KNEE REVISION Right 10/11/2016   Procedure: TOTAL KNEE REVISION;  Surgeon: Hessie Knows, MD;  Location: [facility] ORS;  Service: Orthopedics;  Laterality: Right;  . TUBAL LIGATION      Family History  Problem Relation Age of Onset  . Diabetes Mother   . Heart disease Father   . Heart attack Father   . Diabetes Sister   . Drug abuse Sister   . Breast cancer Maternal Aunt        <50  . Breast cancer Paternal Aunt        x2.  <50  . Drug abuse Son     Social History   Socioeconomic History  . Marital status: Divorced    Spouse name: Not on file  . Number of children: Not on file  . Years of education: Not on file  . Highest education level: Not on file  Social Needs  .  Financial resource strain: Not on file  . Food insecurity - worry: Not on file  . Food insecurity - inability: Not on file  . Transportation needs - medical: Not on file  . Transportation needs - non-medical: Not on file  Occupational History  . Occupation: disabled  Tobacco Use  . Smoking status: Current Every Day Smoker    Packs/day: 0.50    Years: 37.00    Pack years: 18.50    Types: Cigarettes    Start date: 11/26/1979  . Smokeless tobacco: Never Used  Substance and Sexual Activity  . Alcohol use: No    Alcohol/week: 0.0 oz  . Drug use: No    Comment: quit crack cocaine 2004  . Sexual activity: Yes    Partners: Male    Birth control/protection: None  Other Topics Concern  . Not on file  Social History Narrative   Lives with boyfriend   Disable from psychiatric disease     Current Outpatient Medications:  .  amphetamine-dextroamphetamine (ADDERALL) 30 MG tablet, Take 30  mg by mouth 2 (two) times daily., Disp: , Rfl:  .  ARIPiprazole (ABILIFY) 15 MG tablet, Take 15 mg by mouth at bedtime., Disp: , Rfl:  .  busPIRone (BUSPAR) 30 MG tablet, Take 30 mg by mouth every evening. , Disp: , Rfl:  .  clonazePAM (KLONOPIN) 1 MG tablet, Take 1-2 mg by mouth 3 (three) times daily as needed for anxiety (takes 2 every  morning and then 1-2 tablets as needed during the day for anxiety). , Disp: , Rfl:  .  cyclobenzaprine (FLEXERIL) 10 MG tablet, TAKE 1 TABLET BY MOUTH TWICE A DAY, Disp: 60 tablet, Rfl: 2 .  dexlansoprazole (DEXILANT) 60 MG capsule, Take 1 capsule (60 mg total) by mouth daily., Disp: 30 capsule, Rfl: 5 .  hydrOXYzine (ATARAX/VISTARIL) 25 MG tablet, Take 25 mg by mouth daily as needed for anxiety or itching (hives). , Disp: , Rfl:  .  naloxone (NARCAN) nasal spray 4 mg/0.1 mL, For respiratory suppression from opioids, Disp: 1 kit, Rfl: 2 .  Oxycodone HCl 10 MG TABS, Take 1 tablet (10 mg total) by mouth every 4 (four) hours while awake., Disp: 150 tablet, Rfl: 0 .   polyethylene glycol (MIRALAX / GLYCOLAX) packet, Take 17 g by mouth daily., Disp: 14 each, Rfl: 3 .  rosuvastatin (CRESTOR) 10 MG tablet, Take 1 tablet (10 mg total) by mouth daily., Disp: 30 tablet, Rfl: 7 .  sertraline (ZOLOFT) 100 MG tablet, Take 100 mg by mouth 2 (two) times daily. , Disp: , Rfl:  .  tiotropium (SPIRIVA HANDIHALER) 18 MCG inhalation capsule, Place 1 capsule (18 mcg total) into inhaler and inhale daily., Disp: 30 capsule, Rfl: 5 .  traZODone (DESYREL) 150 MG tablet, Take 300 mg by mouth at bedtime. , Disp: , Rfl:  .  doxepin (SINEQUAN) 50 MG capsule, Take 50 mg by mouth at bedtime. , Disp: , Rfl:  .  predniSONE (DELTASONE) 20 MG tablet, Take 1 tablet (20 mg total) by mouth 2 (two) times daily with a meal., Disp: 10 tablet, Rfl: 0  Allergies  Allergen Reactions  . Codeine Nausea Only  . Imitrex [Sumatriptan] Other (See Comments)    Chest pain     ROS  Constitutional: Negative for fever , positive for weight change.  Respiratory: Positive for cough and wheezing but no  shortness of breath.   Cardiovascular: Negative for chest pain or palpitations.  Gastrointestinal: positive  for abdominal pain - periumbilical intermittent from hernia no bowel changes.  Musculoskeletal: Negative for gait problem , positive for right  joint swelling.  Skin: Negative for rash.  Neurological: Negative for dizziness or headache.  No other specific complaints in a complete review of systems (except as listed in HPI above).  Objective  Vitals:   04/21/17 0743  BP: 100/72  Pulse: 68  Resp: 14  SpO2: 98%  Weight: 252 lb 11.2 oz (114.6 kg)  Height: _0  (1.626 m)    Body mass index is 43.38 kg/m.  Physical Exam  Constitutional: Patient appears well-developed and well-nourished. Obese  No distress.  HEENT: head atraumatic, normocephalic, pupils equal and reactive to light,  neck supple, throat within normal limits Cardiovascular: Normal rate, regular rhythm and normal heart  sounds.  No murmur heard. No BLE edema. Pulmonary/Chest: Effort normal she has scattered rhonchi, no wheezing or crackles. No respiratory distress. Abdominal: Soft.  There is no tenderness. Psychiatric: Patient has a normal mood and affect. behavior is normal. Judgment and thought content normal.  Recent Results (  from the past 2160 hour(s))  Hemoglobin A1c     Status: None   Collection Time: 01/23/17  2:34 PM  Result Value Ref Range   Hgb A1c MFr Bld 5.2 <5.7 % of total Hgb    Comment: For the purpose of screening for the presence of diabetes: . <5.7%       Consistent with the absence of diabetes 5.7-6.4%    Consistent with increased risk for diabetes             (prediabetes) > or =6.5%  Consistent with diabetes . This assay result is consistent with a decreased risk of diabetes. . Currently, no consensus exists regarding use of hemoglobin A1c for diagnosis of diabetes in children. . According to American Diabetes Association (ADA) guidelines, hemoglobin A1c <7.0% represents optimal control in non-pregnant diabetic patients. Different metrics may apply to specific patient populations.  Standards of Medical Care in Diabetes(ADA). .    Mean Plasma Glucose 103 (calc)   eAG (mmol/L) 5.7 (calc)  COMPLETE METABOLIC PANEL WITH GFR     Status: Abnormal   Collection Time: 01/23/17  2:34 PM  Result Value Ref Range   Glucose, Bld 106 (H) 65 - 99 mg/dL    Comment: .            Fasting reference interval . For someone without known diabetes, a glucose value between 100 and 125 mg/dL is consistent with prediabetes and should be confirmed with a follow-up test. .    BUN 16 7 - 25 mg/dL   Creat 0.73 0.50 - 1.10 mg/dL   GFR, Est Non African American 97 > OR = 60 mL/min/1.52m   GFR, Est African American 112 > OR = 60 mL/min/1.743m  BUN/Creatinine Ratio NOT APPLICABLE 6 - 22 (calc)   Sodium 141 135 - 146 mmol/L   Potassium 4.4 3.5 - 5.3 mmol/L   Chloride 104 98 - 110 mmol/L    CO2 28 20 - 32 mmol/L   Calcium 9.6 8.6 - 10.2 mg/dL   Total Protein 7.1 6.1 - 8.1 g/dL   Albumin 4.3 3.6 - 5.1 g/dL   Globulin 2.8 1.9 - 3.7 g/dL (calc)   AG Ratio 1.5 1.0 - 2.5 (calc)   Total Bilirubin 0.4 0.2 - 1.2 mg/dL   Alkaline phosphatase (APISO) 103 33 - 115 U/L   AST 16 10 - 35 U/L   ALT 17 6 - 29 U/L  Insulin, fasting     Status: None   Collection Time: 01/23/17  2:34 PM  Result Value Ref Range   Insulin 19.5 2.0 - 19.6 uIU/mL    Comment: This insulin assay shows strong cross-reactivity for some insulin analogs (lispro, aspart, and glargine) and much lower cross-reactivity with others (detemir, glulisine).   Lipid panel     Status: Abnormal   Collection Time: 01/23/17  2:34 PM  Result Value Ref Range   Cholesterol 214 (H) <200 mg/dL   HDL 33 (L) >50 mg/dL   Triglycerides 194 (H) <150 mg/dL   LDL Cholesterol (Calc) 148 (H) mg/dL (calc)    Comment: Reference range: <100 . Desirable range <100 mg/dL for primary prevention;   <70 mg/dL for patients with CHD or diabetic patients  with > or = 2 CHD risk factors. . Marland KitchenDL-C is now calculated using the Martin-Hopkins  calculation, which is a validated novel method providing  better accuracy than the Friedewald equation in the  estimation of LDL-C.  MaCresenciano Genret al. JAAnnamaria [id number];[phone](  http://education.[facility].com/faq/FAQ164)    Total CHOL/HDL Ratio 6.5 (H) <5.0 (calc)   Non-HDL Cholesterol (Calc) 181 (H) <130 mg/dL (calc)    Comment: For patients with diabetes plus 1 major ASCVD risk  factor, treating to a non-HDL-C goal of <100 mg/dL  (LDL-C of <70 mg/dL) is considered a therapeutic  option.   CBC with Differential/Platelet     Status: Abnormal   Collection Time: 01/23/17  2:34 PM  Result Value Ref Range   WBC 6.1 3.8 - 10.8 Thousand/uL   RBC 5.39 (H) 3.80 - 5.10 Million/uL   Hemoglobin 14.1 11.7 - 15.5 g/dL   HCT 43.7 35.0 - 45.0 %   MCV 81.1 80.0 - 100.0 fL   MCH 26.2 (L) 27.0 - 33.0 pg    MCHC 32.3 32.0 - 36.0 g/dL   RDW 16.3 (H) 11.0 - 15.0 %   Platelets 265 140 - 400 Thousand/uL   MPV 11.4 7.5 - 12.5 fL   Neutro Abs 4,111 1,500 - 7,800 cells/uL   Lymphs Abs 1,415 850 - 3,900 cells/uL   WBC mixed population 390 200 - 950 cells/uL   Eosinophils Absolute 140 15 - 500 cells/uL   Basophils Absolute 43 0 - 200 cells/uL   Neutrophils Relative % 67.4 %   Total Lymphocyte 23.2 %   Monocytes Relative 6.4 %   Eosinophils Relative 2.3 %   Basophils Relative 0.7 %  Urine Drug Screen, Qualitative ([facility] only)     Status: None   Collection Time: 01/24/17 11:01 AM  Result Value Ref Range   Tricyclic, Ur Screen NONE DETECTED NONE DETECTED   Amphetamines, Ur Screen NONE DETECTED NONE DETECTED   MDMA (Ecstasy)Ur Screen NONE DETECTED NONE DETECTED   Cocaine Metabolite,Ur [place] NONE DETECTED NONE DETECTED   Opiate, Ur Screen NONE DETECTED NONE DETECTED   Phencyclidine (PCP) Ur S NONE DETECTED NONE DETECTED   Cannabinoid 50 Ng, Ur [place] NONE DETECTED NONE DETECTED   Barbiturates, Ur Screen NONE DETECTED NONE DETECTED   Benzodiazepine, Ur Scrn NONE DETECTED NONE DETECTED   Methadone Scn, Ur NONE DETECTED NONE DETECTED    Comment: (NOTE) 825  Tricyclics, urine               Cutoff 1000 ng/mL 200  Amphetamines, urine             Cutoff 1000 ng/mL 300  MDMA (Ecstasy), urine           Cutoff 500 ng/mL 400  Cocaine Metabolite, urine       Cutoff 300 ng/mL 500  Opiate, urine                   Cutoff 300 ng/mL 600  Phencyclidine (PCP), urine      Cutoff 25 ng/mL 700  Cannabinoid, urine              Cutoff 50 ng/mL 800  Barbiturates, urine             Cutoff 200 ng/mL 900  Benzodiazepine, urine           Cutoff 200 ng/mL 1000 Methadone, urine                Cutoff 300 ng/mL 1100 1200 The urine drug screen provides only a preliminary, unconfirmed 1300 analytical test result and should not be used for non-medical 1400 purposes. Clinical consideration and professional judgment should 1500 be  applied to any positive drug screen result due to possible 1600 interfering substances. A more specific alternate  chemical method 1700 must be used in order to obtain a confirmed analytical result.  1800 Gas chromato graphy / mass spectrometry (GC/MS) is the preferred 1900 confirmatory method.   Miscellaneous [facility] test (send-out)     Status: None   Collection Time: 01/24/17 11:01 AM  Result Value Ref Range   [facility] test code [phone]    [facility] test name NICOTINE COTININE SCREEN AND CONFIRMATION    Misc [facility] result COMMENT     Comment: (NOTE) Test Ordered: [id number] Nicotine Metabolite, Urine Cotinine                       Note:            ng/mL    UI     See Final Results                                             Reference Range: Cutoff=300                            Cotinine                       Positive    Linda.Low ]          UI     Reference Range: Cutoff=300                            Cotinine level equal to or greater than 300 ng/mL is consistent with the use of tobacco or tobacco cessation product. Cotinine Confirm               774              ng/mL    UI     Reference Range: Cutoff=300                            Performed At: [facility] [place], [place] [id number] Lindon Romp MD VA:[id number] Performed At: [facility] [address] [place], [place] [id number] Delight Ovens A MD EL:[id number]   POCT HgB A1C     Status: Normal   Collection Time: 04/21/17  7:46 AM  Result Value Ref Range   Hemoglobin A1C 5.4      PHQ2/9: Depression screen [facility] 2/9 01/10/2017 11/03/2016 08/23/2016 07/05/2016 05/05/2016  Decreased Interest 0 0 0 0 0  Down, Depressed, Hopeless 0 0 0 0 0  PHQ - 2 Score 0 0 0 0 0     Fall Risk: Fall Risk  04/21/2017 03/10/2017 01/10/2017 11/03/2016 08/23/2016  Falls in the past year? _0   Number falls in past yr: - - - - -  Injury with Fall? - - - - -  [place] for fall due to : - - - - -  Follow  up - - - - -  Comment - - - - -     Functional Status Survey: Is the patient deaf or have difficulty hearing?: No Does the patient have difficulty seeing, even when wearing glasses/contacts?: No Does the patient have difficulty concentrating, remembering, or making decisions?: No Does the patient have difficulty  walking or climbing stairs?: No Does the patient have difficulty dressing or bathing?: No Does the patient have difficulty doing errands alone such as visiting a doctor's office or shopping?: No   Assessment & Plan  1. Diabetes mellitus type 2, diet-controlled ([facility])  - POCT HgB A1C  2. Dyslipidemia associated with type 2 diabetes mellitus ([place])  Continue medication, discussed importance of exercise, fish and tree nuts  3. Bipolar affective disorder, currently depressed, mild (HCC)  Sees Dr. Su  4. COPD, moderate ([place])  - tiotropium (SPIRIVA HANDIHALER) 18 MCG inhalation capsule; Place 1 capsule (18 mcg total) into inhaler and inhale daily.  Dispense: 30 capsule; Refill: 5  5. GERD without esophagitis  Sees Dr Vicente Males   6. Periumbilical abdominal pain  Seeing surgeon, but advised to lose weight first  7. COPD with acute exacerbation ([facility])  - predniSONE (DELTASONE) 20 MG tablet; Take 1 tablet (20 mg total) by mouth 2 (two) times daily with a meal.  Dispense: 10 tablet; Refill: 0  8. Encounter for screening mammogram for breast cancer  - MM Digital Screening; Future  9. Morbid obesity with BMI of 40.0-44.9, adult [facility])  I will give her information about local bariatric center, she stopped Ozempic, still going to the gym, eating salads and drinking Slim fast but continue to gain weight.

## 2017-04-26 DIAGNOSIS — Z79899 Other long term (current) drug therapy: Secondary | ICD-10-CM | POA: Diagnosis not present

## 2017-04-28 DIAGNOSIS — G4733 Obstructive sleep apnea (adult) (pediatric): Secondary | ICD-10-CM | POA: Diagnosis not present

## 2017-04-28 DIAGNOSIS — J449 Chronic obstructive pulmonary disease, unspecified: Secondary | ICD-10-CM | POA: Diagnosis not present

## 2017-05-04 ENCOUNTER — Other Ambulatory Visit: Payer: Self-pay

## 2017-05-04 ENCOUNTER — Ambulatory Visit (INDEPENDENT_AMBULATORY_CARE_PROVIDER_SITE_OTHER): Payer: Medicare Other | Admitting: Surgery

## 2017-05-04 ENCOUNTER — Encounter: Payer: Self-pay | Admitting: Anesthesiology

## 2017-05-04 ENCOUNTER — Encounter: Payer: Self-pay | Admitting: Surgery

## 2017-05-04 ENCOUNTER — Ambulatory Visit: Payer: Medicare Other | Attending: Anesthesiology | Admitting: Anesthesiology

## 2017-05-04 VITALS — BP 141/95 | HR 89 | Temp 97.7°F | Ht 64.0 in | Wt 256.6 lb

## 2017-05-04 VITALS — BP 157/98 | HR 95 | Temp 97.5°F | Ht 64.0 in | Wt 253.0 lb

## 2017-05-04 DIAGNOSIS — M545 Low back pain, unspecified: Secondary | ICD-10-CM

## 2017-05-04 DIAGNOSIS — M961 Postlaminectomy syndrome, not elsewhere classified: Secondary | ICD-10-CM

## 2017-05-04 DIAGNOSIS — G8918 Other acute postprocedural pain: Secondary | ICD-10-CM

## 2017-05-04 DIAGNOSIS — M47816 Spondylosis without myelopathy or radiculopathy, lumbar region: Secondary | ICD-10-CM

## 2017-05-04 DIAGNOSIS — M5136 Other intervertebral disc degeneration, lumbar region: Secondary | ICD-10-CM | POA: Insufficient documentation

## 2017-05-04 DIAGNOSIS — Z842 Family history of other diseases of the genitourinary system: Secondary | ICD-10-CM

## 2017-05-04 DIAGNOSIS — M159 Polyosteoarthritis, unspecified: Secondary | ICD-10-CM

## 2017-05-04 DIAGNOSIS — Z79899 Other long term (current) drug therapy: Secondary | ICD-10-CM | POA: Diagnosis not present

## 2017-05-04 DIAGNOSIS — M5386 Other specified dorsopathies, lumbar region: Secondary | ICD-10-CM

## 2017-05-04 DIAGNOSIS — Z79891 Long term (current) use of opiate analgesic: Secondary | ICD-10-CM | POA: Diagnosis not present

## 2017-05-04 DIAGNOSIS — K432 Incisional hernia without obstruction or gangrene: Secondary | ICD-10-CM | POA: Diagnosis not present

## 2017-05-04 DIAGNOSIS — M150 Primary generalized (osteo)arthritis: Secondary | ICD-10-CM | POA: Diagnosis not present

## 2017-05-04 DIAGNOSIS — F1190 Opioid use, unspecified, uncomplicated: Secondary | ICD-10-CM

## 2017-05-04 DIAGNOSIS — M1990 Unspecified osteoarthritis, unspecified site: Secondary | ICD-10-CM | POA: Insufficient documentation

## 2017-05-04 DIAGNOSIS — R109 Unspecified abdominal pain: Secondary | ICD-10-CM | POA: Diagnosis present

## 2017-05-04 DIAGNOSIS — R1033 Periumbilical pain: Secondary | ICD-10-CM | POA: Diagnosis not present

## 2017-05-04 DIAGNOSIS — M8949 Other hypertrophic osteoarthropathy, multiple sites: Secondary | ICD-10-CM

## 2017-05-04 DIAGNOSIS — M51369 Other intervertebral disc degeneration, lumbar region without mention of lumbar back pain or lower extremity pain: Secondary | ICD-10-CM

## 2017-05-04 DIAGNOSIS — M542 Cervicalgia: Secondary | ICD-10-CM | POA: Diagnosis not present

## 2017-05-04 DIAGNOSIS — G894 Chronic pain syndrome: Secondary | ICD-10-CM | POA: Diagnosis not present

## 2017-05-04 DIAGNOSIS — M4696 Unspecified inflammatory spondylopathy, lumbar region: Secondary | ICD-10-CM

## 2017-05-04 MED ORDER — OXYCODONE HCL 10 MG PO TABS: 10.0000 mg | ORAL_TABLET | ORAL | 0 refills | Status: DC

## 2017-05-04 MED ORDER — NICOTINE 14 MG/24HR TD PT24: 14.0000 mg | MEDICATED_PATCH | Freq: Every day | TRANSDERMAL | 0 refills | Status: DC

## 2017-05-04 MED ORDER — METHOCARBAMOL 500 MG PO TABS: 500.0000 mg | ORAL_TABLET | Freq: Four times a day (QID) | ORAL | 3 refills | Status: DC

## 2017-05-04 NOTE — Patient Instructions (Signed)
You may pick up your Nicoderm patches at the pharmacy.  Please see your follow up appointment listed below.

## 2017-05-04 NOTE — Progress Notes (Signed)
Safety precautions to be maintained throughout the outpatient stay will include: orient to surroundings, keep bed in low position, maintain call bell within reach at all times, provide assistance with transfer out of bed and ambulation.  

## 2017-05-04 NOTE — Progress Notes (Signed)
Nursing Pain Medication Assessment:  Safety precautions to be maintained throughout the outpatient stay will include: orient to surroundings, keep bed in low position, maintain call bell within reach at all times, provide assistance with transfer out of bed and ambulation.  Medication Inspection Compliance: Pill count conducted under aseptic conditions, in front of the patient. Neither the pills nor the bottle was removed from the patient's sight at any time. Once count was completed pills were immediately returned to the patient in their original bottle.  Medication: Oxycodone IR Pill/Patch Count: 21 of 150 pills remain Pill/Patch Appearance: Markings consistent with prescribed medication Bottle Appearance: Standard pharmacy container. Clearly labeled. Filled Date: 52 / 01/ 2018 Last Medication intake:  Today

## 2017-05-04 NOTE — Progress Notes (Signed)
Wendy Johnson is well known to me for an incisional hernia that is symptomatic. She continues to smoke. She is to have some intermittent abdominal pain. No fevers no chills no evidence of circulation  PE: Obese female Reducible ventral hernia, no peritonitis  A/p scars with the patient was again about smoking cessation. I will be willing to perform an open repair if she stops smoking. Going to prescribe her some nicotine patches and she is committed to smoking cessation. I will see her back in 2-3 weeks and reassess her smoking status. If she quits week and tentatively scheduled her surgery in about 6-8 weeks. Aspect around 15 minutes in this encounter with greater than 50% of the time spent in coordination /counseling of her care

## 2017-05-05 ENCOUNTER — Telehealth: Payer: Self-pay

## 2017-05-05 NOTE — Progress Notes (Signed)
Subjective:  Patient ID: Wendy Johnson, female    DOB: 05/19/1965  Age: 51 y.o. MRN: [id number]  CC: Abdominal Pain   Procedure: None  HPI Wendy Johnson presents for reevaluation.  She is last seen a few months ago and has been doing about the same.  She has diffuse body pain and severe pain in the knees low back and hips.  She is been taking her medications as prescribed including her oxycodone 10 mg tablets.  Unfortunately her pain has remained quite recalcitrant requiring chronic opioid therapy.  Based on her narcotic assessment sheet she continues to derive good functional lifestyle improvement with her medications with no untoward side effects.  Furthermore, the quality characteristic and distribution of her pain of remained stable in nature without significant change.  No changes in bowel or bladder function or lower extremity strength or function are noted.  Outpatient Medications Prior to Visit  Medication Sig Dispense Refill  . amphetamine-dextroamphetamine (ADDERALL) 30 MG tablet Take 30 mg by mouth 2 (two) times daily.    . busPIRone (BUSPAR) 30 MG tablet Take 30 mg by mouth every evening.     . clonazePAM (KLONOPIN) 1 MG tablet Take 1-2 mg by mouth 3 (three) times daily as needed for anxiety (takes 2 every  morning and then 1-2 tablets as needed during the day for anxiety).     . cyclobenzaprine (FLEXERIL) 10 MG tablet TAKE 1 TABLET BY MOUTH TWICE A DAY 60 tablet 2  . dexlansoprazole (DEXILANT) 60 MG capsule Take 1 capsule (60 mg total) by mouth daily. 30 capsule 5  . hydrOXYzine (ATARAX/VISTARIL) 25 MG tablet Take 25 mg by mouth daily as needed for anxiety or itching (hives).     . naloxone (NARCAN) nasal spray 4 mg/0.1 mL For respiratory suppression from opioids 1 kit 2  . polyethylene glycol (MIRALAX / GLYCOLAX) packet Take 17 g by mouth daily. 14 each 3  . rosuvastatin (CRESTOR) 10 MG tablet Take 1 tablet (10 mg total) by mouth daily. 30 tablet 7  . sertraline (ZOLOFT) 100  MG tablet Take 100 mg by mouth 2 (two) times daily.     Marland Kitchen tiotropium (SPIRIVA HANDIHALER) 18 MCG inhalation capsule Place 1 capsule (18 mcg total) into inhaler and inhale daily. 30 capsule 5  . Oxycodone HCl 10 MG TABS Take 1 tablet (10 mg total) by mouth every 4 (four) hours while awake. 150 tablet 0  . nicotine (NICODERM CQ) 14 mg/24hr patch Place 1 patch (14 mg total) onto the skin daily. (Patient not taking: Reported on 05/04/2017) 28 patch 0   No facility-administered medications prior to visit.     Review of Systems CNS: No confusion or sedation Cardiac: No angina or palpitations GI: No abdominal pain or constipation CNS: No confusion or sedation GI: No abdominal pain or constipation Cardiac: No angina or palpitations  Objective:  BP (!) 157/98   Pulse 95   Temp (!) 97.5 F (36.4 C)   Ht _0  (1.626 m)   Wt 253 lb (114.8 kg)   LMP 09/26/2014 Comment: Total  SpO2 95%   BMI 43.43 kg/m    BP Readings from Last 3 Encounters:  05/04/17 (!) 157/98  05/04/17 (!) 141/95  04/21/17 100/72     Wt Readings from Last 3 Encounters:  05/04/17 253 lb (114.8 kg)  05/04/17 256 lb 9.6 oz (116.4 kg)  04/21/17 252 lb 11.2 oz (114.6 kg)     Physical Exam Pt is alert and oriented  PERRL EOMI HEART IS RRR no murmur or rub LCTA no wheezing or rales MUSCULOSKELETAL reveals some paraspinous muscle tenderness in the lumbar region.  She is ambulating with an antalgic gait.  Otherwise her physical exam appears at baseline.  Labs  Lab Results  Component Value Date   HGBA1C 5.4 04/21/2017   HGBA1C 5.2 01/23/2017   HGBA1C 5.5 08/29/2016   Lab Results  Component Value Date   MICROALBUR 20 07/28/2016   LDLCALC NOT CALC 07/28/2016   CREATININE 0.73 01/23/2017    -------------------------------------------------------------------------------------------------------------------- Lab Results  Component Value Date   WBC 6.1 01/23/2017   HGB 14.1 01/23/2017   HCT 43.7 01/23/2017    PLT 265 01/23/2017   GLUCOSE 106 (H) 01/23/2017   CHOL 214 (H) 01/23/2017   TRIG 194 (H) 01/23/2017   HDL 33 (L) 01/23/2017   LDLCALC NOT CALC 07/28/2016   ALT 17 01/23/2017   AST 16 01/23/2017   NA 141 01/23/2017   K 4.4 01/23/2017   CL 104 01/23/2017   CREATININE 0.73 01/23/2017   BUN 16 01/23/2017   CO2 28 01/23/2017   TSH 0.769 02/03/2016   INR 0.97 10/04/2016   HGBA1C 5.4 04/21/2017   MICROALBUR 20 07/28/2016    --------------------------------------------------------------------------------------------------------------------- No results found.   Assessment & Plan:   Wendy Johnson was seen today for abdominal pain.  Diagnoses and all orders for this visit:  Cervicalgia  Primary osteoarthritis involving multiple joints  Low back pain at multiple sites  Facet arthritis of lumbar region ([place])  Chronic, continuous use of opioids -     ToxASSURE Select 13 (MW), Urine  DDD (degenerative disc disease), lumbar  Periumbilical abdominal pain  Low back derangement syndrome  Failed back surgical syndrome  Post-op pain -     Discontinue: Oxycodone HCl 10 MG TABS; Take 1 tablet (10 mg total) by mouth every 4 (four) hours while awake. -     Oxycodone HCl 10 MG TABS; Take 1 tablet (10 mg total) by mouth every 4 (four) hours while awake.  FH: TAH-BSO (total abdominal hysterectomy and bilateral salpingo-oophorectomy) -     Discontinue: Oxycodone HCl 10 MG TABS; Take 1 tablet (10 mg total) by mouth every 4 (four) hours while awake. -     Oxycodone HCl 10 MG TABS; Take 1 tablet (10 mg total) by mouth every 4 (four) hours while awake.  Other orders -     methocarbamol (ROBAXIN) 500 MG tablet; Take 1 tablet (500 mg total) by mouth 4 (four) times daily.        ----------------------------------------------------------------------------------------------------------------------  Problem List Items Addressed This Visit      Unprioritized   DDD (degenerative disc disease),  lumbar   Relevant Medications   Oxycodone HCl 10 MG TABS   methocarbamol (ROBAXIN) 500 MG tablet   Facet arthritis of lumbar region ([facility])   Relevant Medications   Oxycodone HCl 10 MG TABS   methocarbamol (ROBAXIN) 500 MG tablet   Low back derangement syndrome   Relevant Medications   Oxycodone HCl 10 MG TABS   methocarbamol (ROBAXIN) 500 MG tablet   Primary osteoarthritis involving multiple joints   Relevant Medications   Oxycodone HCl 10 MG TABS   methocarbamol (ROBAXIN) 500 MG tablet    Other Visit Diagnoses    Cervicalgia    -  Primary   Low back pain at multiple sites       Relevant Medications   Oxycodone HCl 10 MG TABS   methocarbamol (ROBAXIN) 500 MG tablet  Chronic, continuous use of opioids       Relevant Orders   ToxASSURE Select 13 (MW), Urine   Periumbilical abdominal pain       Failed back surgical syndrome       Relevant Medications   Oxycodone HCl 10 MG TABS   methocarbamol (ROBAXIN) 500 MG tablet   Post-op pain       Relevant Medications   Oxycodone HCl 10 MG TABS   FH: TAH-BSO (total abdominal hysterectomy and bilateral salpingo-oophorectomy)       Relevant Medications   Oxycodone HCl 10 MG TABS        ----------------------------------------------------------------------------------------------------------------------  1. Cervicalgia Continue stretching strengthening exercises as previously reviewed.  Going to stop the Flexeril and start her on Robaxin 500 mg 1 p.o. 4 times daily.  2. Primary osteoarthritis involving multiple joints Continue follow-up with her primary care physicians and Dr. Rudene Christians for her orthopedic issues.  3. Low back pain at multiple sites   4. Facet arthritis of lumbar region ([place])   5. Chronic, continuous use of opioids We reviewed the [facility] practitioner database information and it is appropriate.  We will give her refills for January 1 and January 31.  Unfortunately her pain has remained quite recalcitrant and  we have been unable to discontinue opioid therapy secondary to the diffuse and chronic nature of her pain.  Fortunately she continues to derive good functional benefit from the medications and I think it is appropriate for her to continue on this therapy at present. - ToxASSURE Select 13 (MW), Urine  6. DDD (degenerative disc disease), lumbar   7. Periumbilical abdominal pain She is due for a possible hernia surgery with Dr. Dahlia Byes in the near future.  8. Low back derangement syndrome   9. Failed back surgical syndrome   10. Post-op pain  - Oxycodone HCl 10 MG TABS; Take 1 tablet (10 mg total) by mouth every 4 (four) hours while awake.  Dispense: 120 tablet; Refill: 0  11. FH: TAH-BSO (total abdominal hysterectomy and bilateral salpingo-oophorectomy)  - Oxycodone HCl 10 MG TABS; Take 1 tablet (10 mg total) by mouth every 4 (four) hours while awake.  Dispense: 120 tablet; Refill: 0    ----------------------------------------------------------------------------------------------------------------------  I am having Wendy Johnson. Dlugosz "[name]" start on methocarbamol. I am also having her maintain her amphetamine-dextroamphetamine, clonazePAM, busPIRone, sertraline, hydrOXYzine, cyclobenzaprine, polyethylene glycol, dexlansoprazole, naloxone, rosuvastatin, tiotropium, nicotine, and Oxycodone HCl.   Meds ordered this encounter  Medications  . DISCONTD: Oxycodone HCl 10 MG TABS    Sig: Take 1 tablet (10 mg total) by mouth every 4 (four) hours while awake.    Dispense:  120 tablet    Refill:  0    Do not fill until [id number]  . Oxycodone HCl 10 MG TABS    Sig: Take 1 tablet (10 mg total) by mouth every 4 (four) hours while awake.    Dispense:  120 tablet    Refill:  0    Do not fill until [id number]  . methocarbamol (ROBAXIN) 500 MG tablet    Sig: Take 1 tablet (500 mg total) by mouth 4 (four) times daily.    Dispense:  120 tablet    Refill:  3     Medication List        Accurate  as of 05/04/17 11:59 PM. Always use your most recent med list.          amphetamine-dextroamphetamine 30 MG tablet Commonly known as:  ADDERALL  busPIRone 30 MG tablet Commonly known as:  BUSPAR   clonazePAM 1 MG tablet Commonly known as:  KLONOPIN   cyclobenzaprine 10 MG tablet Commonly known as:  FLEXERIL TAKE 1 TABLET BY MOUTH TWICE A DAY   dexlansoprazole 60 MG capsule Commonly known as:  DEXILANT Take 1 capsule (60 mg total) by mouth daily.   hydrOXYzine 25 MG tablet Commonly known as:  ATARAX/VISTARIL   methocarbamol 500 MG tablet Commonly known as:  ROBAXIN Take 1 tablet (500 mg total) by mouth 4 (four) times daily.   naloxone 4 MG/0.1ML Liqd nasal spray kit Commonly known as:  NARCAN For respiratory suppression from opioids   nicotine 14 mg/24hr patch Commonly known as:  NICODERM CQ Place 1 patch (14 mg total) onto the skin daily.   Oxycodone HCl 10 MG Tabs Take 1 tablet (10 mg total) by mouth every 4 (four) hours while awake.   polyethylene glycol packet Commonly known as:  MIRALAX / GLYCOLAX Take 17 g by mouth daily.   rosuvastatin 10 MG tablet Commonly known as:  CRESTOR Take 1 tablet (10 mg total) by mouth daily.   sertraline 100 MG tablet Commonly known as:  ZOLOFT   tiotropium 18 MCG inhalation capsule Commonly known as:  SPIRIVA HANDIHALER Place 1 capsule (18 mcg total) into inhaler and inhale daily.       Where to Get Your Medications    These medications were sent to [facility], [address]  [place], [place] [address]   Phone:  [phone]   methocarbamol 500 MG tablet  nicotine 14 mg/24hr patch   You can get these medications from any pharmacy   Bring a paper prescription for each of these medications  Oxycodone HCl 10 MG Tabs    ----------------------------------------------------------------------------------------------------------------------  Follow-up: Return in about 2  months (around 07/05/2017) for evaluation, med refill.    Molli Barrows, MD

## 2017-05-05 NOTE — Telephone Encounter (Signed)
Talked with Dr Andree Elk and he states that he talked with patient about decreasing medications.

## 2017-05-05 NOTE — Telephone Encounter (Signed)
Talked with pt via telephone and states that they did talk about decreasing her meds and she thought it was after surgery. Pt with understanding

## 2017-05-09 LAB — TOXASSURE SELECT 13 (MW), URINE

## 2017-05-24 DIAGNOSIS — G5603 Carpal tunnel syndrome, bilateral upper limbs: Secondary | ICD-10-CM | POA: Diagnosis not present

## 2017-05-24 DIAGNOSIS — M654 Radial styloid tenosynovitis [de Quervain]: Secondary | ICD-10-CM | POA: Diagnosis not present

## 2017-05-25 ENCOUNTER — Ambulatory Visit: Payer: Medicare Other | Admitting: Surgery

## 2017-05-29 DIAGNOSIS — J449 Chronic obstructive pulmonary disease, unspecified: Secondary | ICD-10-CM | POA: Diagnosis not present

## 2017-05-29 DIAGNOSIS — M654 Radial styloid tenosynovitis [de Quervain]: Secondary | ICD-10-CM | POA: Insufficient documentation

## 2017-05-29 DIAGNOSIS — G4733 Obstructive sleep apnea (adult) (pediatric): Secondary | ICD-10-CM | POA: Diagnosis not present

## 2017-05-29 DIAGNOSIS — G5603 Carpal tunnel syndrome, bilateral upper limbs: Secondary | ICD-10-CM | POA: Insufficient documentation

## 2017-06-05 ENCOUNTER — Telehealth: Payer: Self-pay | Admitting: Family Medicine

## 2017-06-05 ENCOUNTER — Other Ambulatory Visit: Payer: Self-pay | Admitting: Family Medicine

## 2017-06-05 NOTE — Telephone Encounter (Signed)
Pt  Requesting   ibuprofen  800  Mg  Script       No  Active  Rx    Found  In  Record for  That med         LOV   04/21/2017

## 2017-06-05 NOTE — Telephone Encounter (Signed)
Copied from [place] [id number]. Topic: Quick Communication - Rx Refill/Question >> Jun 05, 2017  4:02 PM Percell Belt A wrote: Medication:ibuprofen 800 mg   Has the patient contacted their pharmacy?    (Agent: If no, request that the patient contact the pharmacy for the refill.)   Preferred Pharmacy (with phone number or street name): [facility]   Agent: Please be advised that RX refills may take up to 3 business days. We ask that you follow-up with your pharmacy.

## 2017-06-07 ENCOUNTER — Encounter
Admission: RE | Admit: 2017-06-07 | Discharge: 2017-06-07 | Disposition: A | Discharge status: Home / Self Care | Payer: Medicare Other | Source: Ambulatory Visit | Attending: Orthopedic Surgery | Admitting: Orthopedic Surgery

## 2017-06-07 DIAGNOSIS — G5603 Carpal tunnel syndrome, bilateral upper limbs: Secondary | ICD-10-CM | POA: Diagnosis not present

## 2017-06-07 HISTORY — DX: Type 2 diabetes mellitus without complications: E11.9

## 2017-06-07 HISTORY — DX: Bipolar disorder, unspecified: F31.9

## 2017-06-07 MED ORDER — CEFAZOLIN SODIUM-DEXTROSE 2-4 GM/100ML-% IV SOLN
2.0000 g | Freq: Once | INTRAVENOUS | Status: AC
  Administered 2017-06-08: 2 g via INTRAVENOUS

## 2017-06-07 NOTE — Patient Instructions (Signed)
Your procedure is scheduled on 06/08/17 1330 Report to Day Surgery. [facility]OND FLOOR   Remember: Instructions that are not followed completely may result in serious medical risk, up to and including death, or upon the discretion of your surgeon and anesthesiologist your surgery may need to be rescheduled.     _X__ 1. Do not eat food after midnight the night before your procedure.                 No gum chewing or hard candies. You may drink clear liquids up to 2 hours                 before you are scheduled to arrive for your surgery- DO not drink clear                 liquids within 2 hours of the start of your surgery.                 Clear Liquids include:  water, apple juice without pulp, clear carbohydrate                 drink such as Clearfast of Gartorade, Black Coffee or Tea (Do not add                 anything to coffee or tea).  __X__2.  On the morning of surgery brush your teeth with toothpaste and water, you                 may rinse your mouth with mouthwash if you wish.  Do not swallow any              toothpaste of mouthwash.     _X__ 3.  No Alcohol for 24 hours before or after surgery.   _X__ 4.  Do Not Smoke or use e-cigarettes For 24 Hours Prior to Your Surgery.                 Do not use any chewable tobacco products for at least 6 hours prior to                 surgery.  ____  5.  Bring all medications with you on the day of surgery if instructed.   __X__  6.  Notify your doctor if there is any change in your medical condition      (cold, fever, infections).     Do not wear jewelry, make-up, hairpins, clips or nail polish. Do not wear lotions, powders, or perfumes. You may wear deodorant. Do not shave 48 hours prior to surgery. Men may shave face and neck. Do not bring valuables to the hospital.    [facility] is not responsible for any belongings or valuables.  Contacts, dentures or bridgework may not be worn into surgery. Leave  your suitcase in the car. After surgery it may be brought to your room. For patients admitted to the hospital, discharge time is determined by your treatment team.   Patients discharged the day of surgery will not be allowed to drive home.   _X___ Take these medicines the morning of surgery with A SIP OF WATER:    1.SERTRALINE  2. BUSPAR  3. CLONAZEPAM  4.ABILIFY  5.DEXILANT  6.  ____ Fleet Enema (as directed)   ____ Use CHG Soap as directed  __X__ Use inhalers on the day of surgery AND BRING  ____ Stop metformin 2 days prior to surgery  ____ Take 1/2 of usual insulin dose the night before surgery. No insulin the morning          of surgery.   ____ Stop Coumadin/Plavix/aspirin on   ____ Stop Anti-inflammatories on   ____ Stop supplements until after surgery.    ____ Bring C-Pap to the hospital.

## 2017-06-08 ENCOUNTER — Ambulatory Visit
Admission: RE | Admit: 2017-06-08 | Discharge: 2017-06-08 | Disposition: A | Discharge status: Home / Self Care | Payer: Medicare Other | Source: Ambulatory Visit | Attending: Orthopedic Surgery | Admitting: Orthopedic Surgery

## 2017-06-08 ENCOUNTER — Encounter: Payer: Self-pay | Admitting: *Deleted

## 2017-06-08 ENCOUNTER — Encounter
Admission: RE | Disposition: A | Discharge status: Home / Self Care | Payer: Self-pay | Source: Ambulatory Visit | Attending: Orthopedic Surgery

## 2017-06-08 ENCOUNTER — Ambulatory Visit: Payer: Medicare Other | Admitting: Anesthesiology

## 2017-06-08 ENCOUNTER — Other Ambulatory Visit: Payer: Self-pay

## 2017-06-08 DIAGNOSIS — Z885 Allergy status to narcotic agent status: Secondary | ICD-10-CM | POA: Diagnosis not present

## 2017-06-08 DIAGNOSIS — F419 Anxiety disorder, unspecified: Secondary | ICD-10-CM | POA: Insufficient documentation

## 2017-06-08 DIAGNOSIS — Z96643 Presence of artificial hip joint, bilateral: Secondary | ICD-10-CM | POA: Diagnosis not present

## 2017-06-08 DIAGNOSIS — E1151 Type 2 diabetes mellitus with diabetic peripheral angiopathy without gangrene: Secondary | ICD-10-CM | POA: Diagnosis not present

## 2017-06-08 DIAGNOSIS — M654 Radial styloid tenosynovitis [de Quervain]: Secondary | ICD-10-CM | POA: Diagnosis not present

## 2017-06-08 DIAGNOSIS — Z96651 Presence of right artificial knee joint: Secondary | ICD-10-CM | POA: Diagnosis not present

## 2017-06-08 DIAGNOSIS — I739 Peripheral vascular disease, unspecified: Secondary | ICD-10-CM | POA: Diagnosis not present

## 2017-06-08 DIAGNOSIS — Z79891 Long term (current) use of opiate analgesic: Secondary | ICD-10-CM | POA: Insufficient documentation

## 2017-06-08 DIAGNOSIS — G4739 Other sleep apnea: Secondary | ICD-10-CM | POA: Insufficient documentation

## 2017-06-08 DIAGNOSIS — Z79899 Other long term (current) drug therapy: Secondary | ICD-10-CM | POA: Insufficient documentation

## 2017-06-08 DIAGNOSIS — E119 Type 2 diabetes mellitus without complications: Secondary | ICD-10-CM | POA: Insufficient documentation

## 2017-06-08 DIAGNOSIS — Z87891 Personal history of nicotine dependence: Secondary | ICD-10-CM | POA: Diagnosis not present

## 2017-06-08 DIAGNOSIS — I10 Essential (primary) hypertension: Secondary | ICD-10-CM | POA: Insufficient documentation

## 2017-06-08 DIAGNOSIS — G5603 Carpal tunnel syndrome, bilateral upper limbs: Secondary | ICD-10-CM | POA: Insufficient documentation

## 2017-06-08 DIAGNOSIS — F319 Bipolar disorder, unspecified: Secondary | ICD-10-CM | POA: Diagnosis not present

## 2017-06-08 DIAGNOSIS — E785 Hyperlipidemia, unspecified: Secondary | ICD-10-CM | POA: Insufficient documentation

## 2017-06-08 DIAGNOSIS — G4730 Sleep apnea, unspecified: Secondary | ICD-10-CM | POA: Diagnosis not present

## 2017-06-08 DIAGNOSIS — K219 Gastro-esophageal reflux disease without esophagitis: Secondary | ICD-10-CM | POA: Insufficient documentation

## 2017-06-08 DIAGNOSIS — M1990 Unspecified osteoarthritis, unspecified site: Secondary | ICD-10-CM | POA: Diagnosis not present

## 2017-06-08 DIAGNOSIS — Z888 Allergy status to other drugs, medicaments and biological substances status: Secondary | ICD-10-CM | POA: Insufficient documentation

## 2017-06-08 DIAGNOSIS — G5602 Carpal tunnel syndrome, left upper limb: Secondary | ICD-10-CM | POA: Diagnosis not present

## 2017-06-08 DIAGNOSIS — J449 Chronic obstructive pulmonary disease, unspecified: Secondary | ICD-10-CM | POA: Diagnosis not present

## 2017-06-08 HISTORY — PX: DORSAL COMPARTMENT RELEASE: SHX5039

## 2017-06-08 HISTORY — PX: CARPAL TUNNEL RELEASE: SHX101

## 2017-06-08 SURGERY — RELEASE, FIRST DORSAL COMPARTMENT, HAND
Anesthesia: General | Site: Hand | Laterality: Left | Wound class: Clean

## 2017-06-08 MED ORDER — ONDANSETRON HCL 4 MG/2ML IJ SOLN
INTRAMUSCULAR | Status: DC | PRN
  Administered 2017-06-08: 4 mg via INTRAVENOUS

## 2017-06-08 MED ORDER — ONDANSETRON HCL 4 MG/2ML IJ SOLN
INTRAMUSCULAR | Status: AC
  Filled 2017-06-08: qty 2

## 2017-06-08 MED ORDER — FENTANYL CITRATE (PF) 100 MCG/2ML IJ SOLN
INTRAMUSCULAR | Status: DC | PRN
  Administered 2017-06-08 (×2): 50 ug via INTRAVENOUS

## 2017-06-08 MED ORDER — BUPIVACAINE HCL (PF) 0.5 % IJ SOLN
INTRAMUSCULAR | Status: AC
  Filled 2017-06-08: qty 30

## 2017-06-08 MED ORDER — NEOMYCIN-POLYMYXIN B GU 40-200000 IR SOLN
Status: AC
  Filled 2017-06-08: qty 2

## 2017-06-08 MED ORDER — LIDOCAINE HCL (CARDIAC) 20 MG/ML IV SOLN
INTRAVENOUS | Status: DC | PRN
  Administered 2017-06-08: 100 mg via INTRAVENOUS

## 2017-06-08 MED ORDER — CEFAZOLIN SODIUM-DEXTROSE 2-4 GM/100ML-% IV SOLN
INTRAVENOUS | Status: AC
  Filled 2017-06-08: qty 100

## 2017-06-08 MED ORDER — NEOMYCIN-POLYMYXIN B GU 40-200000 IR SOLN
Status: DC | PRN
  Administered 2017-06-08: 2 mL

## 2017-06-08 MED ORDER — LACTATED RINGERS IV SOLN
INTRAVENOUS | Status: DC
  Administered 2017-06-08 (×2): via INTRAVENOUS

## 2017-06-08 MED ORDER — DEXAMETHASONE SODIUM PHOSPHATE 10 MG/ML IJ SOLN
INTRAMUSCULAR | Status: DC | PRN
  Administered 2017-06-08: 10 mg via INTRAVENOUS

## 2017-06-08 MED ORDER — PROPOFOL 10 MG/ML IV BOLUS
INTRAVENOUS | Status: DC | PRN
  Administered 2017-06-08: 50 mg via INTRAVENOUS
  Administered 2017-06-08: 150 mg via INTRAVENOUS

## 2017-06-08 MED ORDER — MIDAZOLAM HCL 2 MG/2ML IJ SOLN
INTRAMUSCULAR | Status: AC
  Filled 2017-06-08: qty 2

## 2017-06-08 MED ORDER — OXYCODONE HCL 5 MG PO TABS: 5.0000 mg | ORAL_TABLET | Freq: Four times a day (QID) | ORAL | 0 refills | Status: DC | PRN

## 2017-06-08 MED ORDER — FENTANYL CITRATE (PF) 100 MCG/2ML IJ SOLN
INTRAMUSCULAR | Status: AC
  Administered 2017-06-08: 25 ug via INTRAVENOUS
  Filled 2017-06-08: qty 2

## 2017-06-08 MED ORDER — BUPIVACAINE HCL (PF) 0.5 % IJ SOLN
INTRAMUSCULAR | Status: DC | PRN
  Administered 2017-06-08: 15 mL

## 2017-06-08 MED ORDER — MIDAZOLAM HCL 2 MG/2ML IJ SOLN
INTRAMUSCULAR | Status: DC | PRN
  Administered 2017-06-08: 2 mg via INTRAVENOUS

## 2017-06-08 MED ORDER — FENTANYL CITRATE (PF) 100 MCG/2ML IJ SOLN
INTRAMUSCULAR | Status: AC
  Filled 2017-06-08: qty 2

## 2017-06-08 MED ORDER — ONDANSETRON HCL 4 MG/2ML IJ SOLN: 4.0000 mg | Freq: Once | INTRAMUSCULAR | Status: DC | PRN

## 2017-06-08 MED ORDER — FENTANYL CITRATE (PF) 100 MCG/2ML IJ SOLN
25.0000 ug | INTRAMUSCULAR | Status: DC | PRN
  Administered 2017-06-08 (×4): 25 ug via INTRAVENOUS

## 2017-06-08 MED ORDER — DEXAMETHASONE SODIUM PHOSPHATE 10 MG/ML IJ SOLN
INTRAMUSCULAR | Status: AC
  Filled 2017-06-08: qty 1

## 2017-06-08 SURGICAL SUPPLY — 31 items
BANDAGE ACE 3X5.8 VEL STRL LF (GAUZE/BANDAGES/DRESSINGS) ×2 IMPLANT
BANDAGE ELASTIC 3 CLIP NS LF (GAUZE/BANDAGES/DRESSINGS) ×2 IMPLANT
CANISTER SUCT 1200ML W/VALVE (MISCELLANEOUS) ×2 IMPLANT
CAST PADDING 3X4FT ST 30246 (SOFTGOODS) ×1
CHLORAPREP W/TINT 26ML (MISCELLANEOUS) ×2 IMPLANT
CUFF TOURN 18 STER (MISCELLANEOUS) IMPLANT
DRSG TEGADERM 4X4.75 (GAUZE/BANDAGES/DRESSINGS) ×2 IMPLANT
ELECT CAUTERY NEEDLE 2.0 MIC (NEEDLE) IMPLANT
ELECT REM PT RETURN 9FT ADLT (ELECTROSURGICAL) ×2
ELECTRODE REM PT RTRN 9FT ADLT (ELECTROSURGICAL) ×1 IMPLANT
GAUZE PETRO XEROFOAM 1X8 (MISCELLANEOUS) ×2 IMPLANT
GAUZE SPONGE 4X4 12PLY STRL (GAUZE/BANDAGES/DRESSINGS) ×2 IMPLANT
GAUZE XEROFORM 4X4 STRL (GAUZE/BANDAGES/DRESSINGS) ×2 IMPLANT
GLOVE SURG SYN 9.0  PF PI (GLOVE) ×1
GLOVE SURG SYN 9.0 PF PI (GLOVE) ×1 IMPLANT
GOWN SRG 2XL LVL 4 RGLN SLV (GOWNS) ×1 IMPLANT
GOWN STRL NON-REIN 2XL LVL4 (GOWNS) ×1
GOWN STRL REUS W/ TWL LRG LVL3 (GOWN DISPOSABLE) ×1 IMPLANT
GOWN STRL REUS W/TWL LRG LVL3 (GOWN DISPOSABLE) ×1
KIT RM TURNOVER STRD PROC AR (KITS) ×2 IMPLANT
NS IRRIG 500ML POUR BTL (IV SOLUTION) ×2 IMPLANT
PACK EXTREMITY ARMC (MISCELLANEOUS) ×2 IMPLANT
PAD CAST CTTN 3X4 STRL (SOFTGOODS) ×1 IMPLANT
PAD CAST CTTN 4X4 STRL (SOFTGOODS) ×1 IMPLANT
PADDING CAST COTTON 4X4 STRL (SOFTGOODS) ×1
SPLINT CAST 1 STEP 3X12 (MISCELLANEOUS) ×2 IMPLANT
SUT ETHILON 4-0 (SUTURE) ×1
SUT ETHILON 4-0 FS2 18XMFL BLK (SUTURE) ×1
SUT VIC AB 3-0 SH 27 (SUTURE) ×1
SUT VIC AB 3-0 SH 27X BRD (SUTURE) ×1 IMPLANT
SUTURE ETHLN 4-0 FS2 18XMF BLK (SUTURE) ×1 IMPLANT

## 2017-06-08 NOTE — Transfer of Care (Signed)
Immediate Anesthesia Transfer of Care Note  Patient: Wendy Johnson  Procedure(s) Performed: RELEASE DORSAL COMPARTMENT (DEQUERVAIN) (Left Hand) CARPAL TUNNEL RELEASE (Left Hand)  Patient Location: PACU  Anesthesia Type:General  Level of Consciousness: drowsy and patient cooperative  Airway & Oxygen Therapy: Patient Spontanous Breathing and Patient connected to face mask oxygen  Post-op Assessment: Report given to RN and Post -op Vital signs reviewed and stable  Post vital signs: Reviewed and stable  Last Vitals:  Vitals:   06/08/17 1348  BP: 130/87  Pulse: 85  Resp: 18  Temp: 36.6 C  SpO2: 95%    Last Pain:  Vitals:   06/08/17 1348  TempSrc: Oral  PainSc: 8          Complications: No apparent anesthesia complications

## 2017-06-08 NOTE — H&P (Signed)
Reviewed paper H+P, will be scanned into chart. No changes noted.  

## 2017-06-08 NOTE — Anesthesia Postprocedure Evaluation (Signed)
Anesthesia Post Note  Patient: Wendy Johnson  Procedure(s) Performed: RELEASE DORSAL COMPARTMENT (DEQUERVAIN) (Left Hand) CARPAL TUNNEL RELEASE (Left Hand)  Patient location during evaluation: PACU Anesthesia Type: General Level of consciousness: awake and alert Pain management: pain level controlled Vital Signs Assessment: post-procedure vital signs reviewed and stable Respiratory status: spontaneous breathing, nonlabored ventilation, respiratory function stable and patient connected to nasal cannula oxygen Cardiovascular status: blood pressure returned to baseline and stable Postop Assessment: no apparent nausea or vomiting Anesthetic complications: no     Last Vitals:  Vitals:   06/08/17 1718 06/08/17 1732  BP: 123/90 136/90  Pulse: 83 81  Resp: 15 16  Temp: 36.4 C (!) 36.3 C  SpO2: 96% 95%    Last Pain:  Vitals:   06/08/17 1732  TempSrc: Temporal  PainSc: [place]

## 2017-06-08 NOTE — Anesthesia Preprocedure Evaluation (Signed)
Anesthesia Evaluation  Patient identified by MRN, date of birth, ID band Patient awake    Reviewed: Allergy & Precautions, NPO status , Patient's Chart, lab work & pertinent test results, reviewed documented beta blocker date and time   Airway Mallampati: III  TM Distance: >3 FB     Dental  (+) Chipped   Pulmonary shortness of breath, sleep apnea , COPD, former smoker,           Cardiovascular hypertension, Pt. on medications + Peripheral Vascular Disease       Neuro/Psych  Headaches, PSYCHIATRIC DISORDERS Anxiety Depression Bipolar Disorder    GI/Hepatic GERD  Controlled,  Endo/Other  diabetes, Type 2  Renal/GU      Musculoskeletal  (+) Arthritis ,   Abdominal   Peds  Hematology   Anesthesia Other Findings   Reproductive/Obstetrics                             Anesthesia Physical Anesthesia Plan  ASA: III  Anesthesia Plan: General   Post-op Pain Management:    Induction: Intravenous  PONV Risk Score and Plan:   Airway Management Planned: LMA  Additional Equipment:   Intra-op Plan:   Post-operative Plan:   Informed Consent: I have reviewed the patients History and Physical, chart, labs and discussed the procedure including the risks, benefits and alternatives for the proposed anesthesia with the patient or authorized representative who has indicated his/her understanding and acceptance.     Plan Discussed with: CRNA  Anesthesia Plan Comments:         Anesthesia Quick Evaluation

## 2017-06-08 NOTE — Discharge Instructions (Addendum)
AMBULATORY SURGERY  DISCHARGE INSTRUCTIONS   1) The drugs that you were given will stay in your system until tomorrow so for the next 24 hours you should not:  A) Drive an automobile B) Make any legal decisions C) Drink any alcoholic beverage   2) You may resume regular meals tomorrow.  Today it is better to start with liquids and gradually work up to solid foods.  You may eat anything you prefer, but it is better to start with liquids, then soup and crackers, and gradually work up to solid foods.   3) Please notify your doctor immediately if you have any unusual bleeding, trouble breathing, redness and pain at the surgery site, drainage, fever, or pain not relieved by medication. 4)   5) Your post-operative visit with Dr.                                     is: Date:                        Time:    Please call to schedule your post-operative visit.  6) Additional Instructions:      keep dressing clan and dry Pain meds as directed Work on finger range of motion exercises

## 2017-06-08 NOTE — Anesthesia Post-op Follow-up Note (Signed)
Anesthesia QCDR form completed.        

## 2017-06-08 NOTE — Anesthesia Procedure Notes (Signed)
Procedure Name: LMA Insertion Date/Time: 06/08/2017 3:59 PM Performed by: Silvana Newness, CRNA Pre-anesthesia Checklist: Patient identified, Emergency Drugs available, Suction available, Patient being monitored and Timeout performed Patient Re-evaluated:Patient Re-evaluated prior to induction Oxygen Delivery Method: Circle system utilized Preoxygenation: Pre-oxygenation with 100% oxygen Induction Type: IV induction Ventilation: Mask ventilation without difficulty LMA: LMA inserted LMA Size: 4.0 Number of attempts: 1 Placement Confirmation: positive ETCO2 and breath sounds checked- equal and bilateral Tube secured with: Tape Dental Injury: Teeth and Oropharynx as per pre-operative assessment  Comments: Front upper teeth were chipped in pre op

## 2017-06-08 NOTE — Op Note (Signed)
06/08/2017  4:30 PM  PATIENT:  Wendy Johnson  52 y.o. female  PRE-OPERATIVE DIAGNOSIS:  carpal tunnel syndrome And de Quervain's left wrist  POST-OPERATIVE DIAGNOSIS:  carpal tunnel syndrome And de Quervain's left wrist  PROCEDURE:  Procedure(s): RELEASE DORSAL COMPARTMENT (DEQUERVAIN) (Left) CARPAL TUNNEL RELEASE (Left)  SURGEON: Laurene Footman, MD  ASSISTANTS: none  ANESTHESIA:   general  EBL:  Total I/O In: 750 [I.V.:750] Out: 1 [Blood:1]  BLOOD ADMINISTERED:none  DRAINS: none   LOCAL MEDICATIONS USED:  MARCAINE     SPECIMEN:  No Specimen  DISPOSITION OF SPECIMEN:  N/A  COUNTS:  YES  TOURNIQUET:  * Missing tourniquet times found for documented tourniquets in log: [id number] *  IMPLANTS: none  DICTATION: .Dragon Dictation  Patient was brought to the operating room and after adequate general anesthesia was obtained, the left arm was prepped draped in sterile fashion was turned by the upper forearm. After patient identification and timeout procedures were completed, tourniquet was raised. The de Quervain's is approached first through 1 cm incision over the radial styloid the subcutaneous tissues tissue spread protecting subcutaneous nerves. The area of stenosis was identified and the tendon sheath incised there was some fluid present releases carried out proximally and distally to there is complete release of the tendons with no impingement proximal or distal the tendons were intact with moderate tenosynovitis. Going to the carpal tunnel 2 cm incision was made in line with the ring metacarpal subcutaneous tissues tissue spread there was some aberrant muscle from the thenar eminence that crossed over the carpal tunnel was was elevated. The treatment transcarpal leg was entered and a vascular hemostat placed deep to protect the underlying structures with released carried up proximally to there is no further compression and distally until fat was noted around the median nerve there  is good vascular blush to the nerve. The wounds were irrigated and then infiltrated with a total of 15 cc half percent Sensorcaine without epinephrine and the wounds then closed with simple interrupted 4-0 nylon skin sutures. Xeroform 4 x 4 web roll and Ace wrap applied  PLAN OF CARE: Discharge to home after PACU  PATIENT DISPOSITION:  PACU - hemodynamically stable.

## 2017-06-09 ENCOUNTER — Telehealth: Payer: Self-pay | Admitting: Anesthesiology

## 2017-06-09 NOTE — Telephone Encounter (Signed)
Patient had carpal tunnel surgery 06-08-17, received script for 15  5mg  Oxycodone. Wanted to let us know. Also her insurance will not cover her muscle relaxer any longer, wanted to know if a different one could be called in. Please let patient know. She has appt 07-04-17 at 1pm

## 2017-06-09 NOTE — Telephone Encounter (Signed)
Instructed  Patient that PA for Methocarbamol had been done and it was approved and she could get it filled.

## 2017-06-15 NOTE — Telephone Encounter (Signed)
Pt is requesting these be filled. Said she requested a few weeks ago & nobody has filled them or called her back [phone]

## 2017-06-16 ENCOUNTER — Telehealth: Payer: Self-pay | Admitting: Family Medicine

## 2017-06-16 NOTE — Telephone Encounter (Signed)
Patient calling needing ASAP, patient requesting call once sent [phone]

## 2017-06-16 NOTE — Telephone Encounter (Signed)
Tried to call the patient to inform her to call Ortho but she did not answer.

## 2017-06-16 NOTE — Telephone Encounter (Signed)
Copied from [place]. Topic: Quick Communication - Rx Refill/Question >> Jun 16, 2017  8:09 AM Lolita Rieger, RMA wrote: Medication: ibuprofen   Has the patient contacted their pharmacy? yes   (Agent: If no, request that the patient contact the pharmacy for the refill.)   Preferred Pharmacy (with phone number or street name): [facility]   Agent: Please be advised that RX refills may take up to 3 business days. We ask that you follow-up with your pharmacy.

## 2017-06-16 NOTE — Telephone Encounter (Signed)
It is not on active list. Can she get it from Ortho

## 2017-06-19 DIAGNOSIS — Z79899 Other long term (current) drug therapy: Secondary | ICD-10-CM | POA: Diagnosis not present

## 2017-06-26 ENCOUNTER — Ambulatory Visit: Payer: Medicare Other | Admitting: Surgery

## 2017-06-29 DIAGNOSIS — J449 Chronic obstructive pulmonary disease, unspecified: Secondary | ICD-10-CM | POA: Diagnosis not present

## 2017-06-29 DIAGNOSIS — G4733 Obstructive sleep apnea (adult) (pediatric): Secondary | ICD-10-CM | POA: Diagnosis not present

## 2017-07-04 ENCOUNTER — Other Ambulatory Visit: Payer: Self-pay

## 2017-07-04 ENCOUNTER — Encounter: Payer: Self-pay | Admitting: Anesthesiology

## 2017-07-04 ENCOUNTER — Ambulatory Visit: Payer: Medicare Other | Attending: Anesthesiology | Admitting: Anesthesiology

## 2017-07-04 VITALS — BP 149/90 | HR 100 | Temp 97.6°F | Resp 18 | Ht 64.0 in | Wt 250.0 lb

## 2017-07-04 DIAGNOSIS — G8918 Other acute postprocedural pain: Secondary | ICD-10-CM | POA: Diagnosis not present

## 2017-07-04 DIAGNOSIS — M545 Low back pain, unspecified: Secondary | ICD-10-CM

## 2017-07-04 DIAGNOSIS — R1033 Periumbilical pain: Secondary | ICD-10-CM | POA: Diagnosis not present

## 2017-07-04 DIAGNOSIS — Z79899 Other long term (current) drug therapy: Secondary | ICD-10-CM | POA: Insufficient documentation

## 2017-07-04 DIAGNOSIS — M159 Polyosteoarthritis, unspecified: Secondary | ICD-10-CM

## 2017-07-04 DIAGNOSIS — Z842 Family history of other diseases of the genitourinary system: Secondary | ICD-10-CM | POA: Diagnosis not present

## 2017-07-04 DIAGNOSIS — R140 Abdominal distension (gaseous): Secondary | ICD-10-CM | POA: Diagnosis not present

## 2017-07-04 DIAGNOSIS — M150 Primary generalized (osteo)arthritis: Secondary | ICD-10-CM | POA: Diagnosis not present

## 2017-07-04 DIAGNOSIS — M47816 Spondylosis without myelopathy or radiculopathy, lumbar region: Secondary | ICD-10-CM

## 2017-07-04 DIAGNOSIS — M8949 Other hypertrophic osteoarthropathy, multiple sites: Secondary | ICD-10-CM

## 2017-07-04 DIAGNOSIS — M4696 Unspecified inflammatory spondylopathy, lumbar region: Secondary | ICD-10-CM

## 2017-07-04 DIAGNOSIS — Z79891 Long term (current) use of opiate analgesic: Secondary | ICD-10-CM | POA: Insufficient documentation

## 2017-07-04 DIAGNOSIS — M1990 Unspecified osteoarthritis, unspecified site: Secondary | ICD-10-CM | POA: Insufficient documentation

## 2017-07-04 DIAGNOSIS — M79642 Pain in left hand: Secondary | ICD-10-CM | POA: Diagnosis not present

## 2017-07-04 DIAGNOSIS — F411 Generalized anxiety disorder: Secondary | ICD-10-CM | POA: Diagnosis not present

## 2017-07-04 DIAGNOSIS — F1190 Opioid use, unspecified, uncomplicated: Secondary | ICD-10-CM

## 2017-07-04 DIAGNOSIS — M542 Cervicalgia: Secondary | ICD-10-CM | POA: Insufficient documentation

## 2017-07-04 DIAGNOSIS — M5386 Other specified dorsopathies, lumbar region: Secondary | ICD-10-CM

## 2017-07-04 MED ORDER — OXYCODONE HCL 10 MG PO TABS: 10.0000 mg | ORAL_TABLET | ORAL | 0 refills | Status: DC

## 2017-07-04 NOTE — Progress Notes (Signed)
Subjective:  Patient ID: Wendy Johnson, female    DOB: Aug 30, 1965  Age: 52 y.o. MRN: [id number]  CC: Hand Pain (left) and Bloated   Procedure: None  HPI BETTA BALLA presents for reevaluation.  She is last seen 2 months ago and has been doing well with her current regimen of medications.  She has had more abdominal pain and some slightly increased lower back pain and knee pain.  She takes her oxycodone 4 times a day for this.  It is well tolerated based on her narcotic assessment sheet.  She continues to derive good functional lifestyle improvement with her medicines.  However, she is suffering from a hernia for which she is going to see Dr. Dahlia Byes with a scheduled surgery for mesh implantation in the near future.  Otherwise the quality characteristic distribution of her pain is otherwise stable in nature with no significant changes noted.  She did recently have carpal tunnel release on the left hand and she is having some neuralgic sharp shooting tender pain over the dorsum of the left hand as described today.  Outpatient Medications Prior to Visit  Medication Sig Dispense Refill  . alprazolam (XANAX) 2 MG tablet     . amphetamine-dextroamphetamine (ADDERALL) 30 MG tablet Take 30 mg by mouth 2 (two) times daily.    . busPIRone (BUSPAR) 30 MG tablet Take 30 mg by mouth 2 (two) times daily.     Marland Kitchen dexlansoprazole (DEXILANT) 60 MG capsule Take 1 capsule (60 mg total) by mouth daily. 30 capsule 5  . hydrOXYzine (ATARAX/VISTARIL) 25 MG tablet Take 25 mg by mouth every 6 (six) hours as needed for anxiety or itching (hives).     . methocarbamol (ROBAXIN) 500 MG tablet Take 1 tablet (500 mg total) by mouth 4 (four) times daily. (Patient taking differently: Take 500 mg by mouth 4 (four) times daily as needed for muscle spasms. ) 120 tablet 3  . naloxone (NARCAN) nasal spray 4 mg/0.1 mL For respiratory suppression from opioids 1 kit 2  . oxyCODONE (ROXICODONE) 5 MG immediate release tablet Take 1  tablet (5 mg total) by mouth every 6 (six) hours as needed for severe pain. 15 tablet 0  . REXULTI 1 MG TABS     . rosuvastatin (CRESTOR) 10 MG tablet Take 1 tablet (10 mg total) by mouth daily. 30 tablet 7  . sertraline (ZOLOFT) 100 MG tablet Take 100 mg by mouth 2 (two) times daily.     Marland Kitchen tiotropium (SPIRIVA HANDIHALER) 18 MCG inhalation capsule Place 1 capsule (18 mcg total) into inhaler and inhale daily. 30 capsule 5  . Oxycodone HCl 10 MG TABS Take 1 tablet (10 mg total) by mouth every 4 (four) hours while awake. 120 tablet 0  . traZODone (DESYREL) 150 MG tablet Take 150 mg by mouth at bedtime as needed for sleep.    . ziprasidone (GEODON) 40 MG capsule     . polyethylene glycol (MIRALAX / GLYCOLAX) packet Take 17 g by mouth daily. (Patient not taking: Reported on 06/07/2017) 14 each 3  . ARIPiprazole (ABILIFY) 15 MG tablet Take 15 mg by mouth daily.    . clonazePAM (KLONOPIN) 1 MG tablet Take 1 mg by mouth 6 (six) times daily.     . cyclobenzaprine (FLEXERIL) 10 MG tablet TAKE 1 TABLET BY MOUTH TWICE A DAY (Patient not taking: Reported on 06/07/2017) 60 tablet 2  . nicotine (NICODERM CQ) 14 mg/24hr patch Place 1 patch (14 mg total) onto the skin  daily. (Patient not taking: Reported on 05/04/2017) 28 patch 0   No facility-administered medications prior to visit.     Review of Systems CNS: No confusion or sedation Cardiac: No angina or palpitations GI: No abdominal pain or constipation Constitutional: No nausea vomiting fevers or chills  Objective:  BP (!) 149/90   Pulse 100   Temp 97.6 F (36.4 C)   Resp 18   Ht 5' 4" (1.626 m)   Wt 250 lb (113.4 kg)   LMP 09/26/2014 Comment: Total  SpO2 98%   BMI 42.91 kg/m    BP Readings from Last 3 Encounters:  07/04/17 (!) 149/90  06/08/17 136/90  05/04/17 (!) 157/98     Wt Readings from Last 3 Encounters:  07/04/17 250 lb (113.4 kg)  06/08/17 250 lb (113.4 kg)  06/07/17 250 lb (113.4 kg)     Physical Exam Pt is alert and  oriented PERRL EOMI HEART IS RRR no murmur or rub LCTA no wheezing or rales MUSCULOSKELETAL reveals some allodynia over the left dorsum of the left thumb.  This is above the thenar space and the radial nerve distribution.  There is no swelling or cutaneous color change.  The scar on the volar surface of the wrist is well-healed.  She has some tenderness in the low back in the paraspinous muscles in the lumbar region.  Otherwise her muscle tone and bulk to the lower extremities as a baseline.  Labs  Lab Results  Component Value Date   HGBA1C 5.4 04/21/2017   HGBA1C 5.2 01/23/2017   HGBA1C 5.5 08/29/2016   Lab Results  Component Value Date   MICROALBUR 20 07/28/2016   LDLCALC NOT CALC 07/28/2016   CREATININE 0.73 01/23/2017    -------------------------------------------------------------------------------------------------------------------- Lab Results  Component Value Date   WBC 6.1 01/23/2017   HGB 14.1 01/23/2017   HCT 43.7 01/23/2017   PLT 265 01/23/2017   GLUCOSE 106 (H) 01/23/2017   CHOL 214 (H) 01/23/2017   TRIG 194 (H) 01/23/2017   HDL 33 (L) 01/23/2017   LDLCALC NOT CALC 07/28/2016   ALT 17 01/23/2017   AST 16 01/23/2017   NA 141 01/23/2017   K 4.4 01/23/2017   CL 104 01/23/2017   CREATININE 0.73 01/23/2017   BUN 16 01/23/2017   CO2 28 01/23/2017   TSH 0.769 02/03/2016   INR 0.97 10/04/2016   HGBA1C 5.4 04/21/2017   MICROALBUR 20 07/28/2016    --------------------------------------------------------------------------------------------------------------------- No results found.   Assessment & Plan:   Paolina was seen today for hand pain and bloated.  Diagnoses and all orders for this visit:  Cervicalgia  Primary osteoarthritis involving multiple joints  Low back pain at multiple sites  Facet arthritis of lumbar region (HCC)  Chronic, continuous use of opioids  Periumbilical abdominal pain  Low back derangement syndrome  Generalized anxiety  disorder  Post-op pain -     Discontinue: Oxycodone HCl 10 MG TABS; Take 1 tablet (10 mg total) by mouth every 4 (four) hours while awake. -     Oxycodone HCl 10 MG TABS; Take 1 tablet (10 mg total) by mouth every 4 (four) hours while awake.  FH: TAH-BSO (total abdominal hysterectomy and bilateral salpingo-oophorectomy) -     Discontinue: Oxycodone HCl 10 MG TABS; Take 1 tablet (10 mg total) by mouth every 4 (four) hours while awake. -     Oxycodone HCl 10 MG TABS; Take 1 tablet (10 mg total) by mouth every 4 (four) hours while awake.        ----------------------------------------------------------------------------------------------------------------------  Problem List Items Addressed This Visit      Unprioritized   Facet arthritis of lumbar region ([place])   Relevant Medications   Oxycodone HCl 10 MG TABS   Generalized anxiety disorder   Relevant Medications   alprazolam (XANAX) 2 MG tablet   Low back derangement syndrome   Relevant Medications   Oxycodone HCl 10 MG TABS   Primary osteoarthritis involving multiple joints   Relevant Medications   Oxycodone HCl 10 MG TABS    Other Visit Diagnoses    Cervicalgia    -  Primary   Low back pain at multiple sites       Relevant Medications   Oxycodone HCl 10 MG TABS   Chronic, continuous use of opioids       Periumbilical abdominal pain       Post-op pain       Relevant Medications   Oxycodone HCl 10 MG TABS   FH: TAH-BSO (total abdominal hysterectomy and bilateral salpingo-oophorectomy)       Relevant Medications   Oxycodone HCl 10 MG TABS        ----------------------------------------------------------------------------------------------------------------------  1. Cervicalgia Continue stretching strengthening exercises  2. Primary osteoarthritis involving multiple joints Secondary to her recent exacerbation of pain and going to give her a one-time increase to 150 tablets for the month.  She has tolerated this in  the past and has experienced a recent exacerbation in her back and knee pain.  We have reviewed the [facility] practitioner database information and it is appropriate for her opioids.  She is receiving her other medications from other sources and this also was reviewed with her today.  We did discuss once again the synergistic potential for sedation with the medication she is on.  3. Low back pain at multiple sites Continue stretching strengthening exercises  4. Facet arthritis of lumbar region ([place])   5. Chronic, continuous use of opioids Above  6. Periumbilical abdominal pain Follow-up with her general surgeon  7. Low back derangement syndrome   8. Generalized anxiety disorder Continue follow-up with her primary care physician  9. Post-op pain  - Oxycodone HCl 10 MG TABS; Take 1 tablet (10 mg total) by mouth every 4 (four) hours while awake.  Dispense: 120 tablet; Refill: 0  10. FH: TAH-BSO (total abdominal hysterectomy and bilateral salpingo-oophorectomy)  - Oxycodone HCl 10 MG TABS; Take 1 tablet (10 mg total) by mouth every 4 (four) hours while awake.  Dispense: 120 tablet; Refill: 0    ----------------------------------------------------------------------------------------------------------------------  I have discontinued Nobie Putnam Prescher "[name]"'s clonazePAM, cyclobenzaprine, nicotine, ARIPiprazole, traZODone, and ziprasidone. I am also having her maintain her amphetamine-dextroamphetamine, busPIRone, sertraline, hydrOXYzine, polyethylene glycol, dexlansoprazole, naloxone, rosuvastatin, tiotropium, methocarbamol, oxyCODONE, alprazolam, REXULTI, and Oxycodone HCl.   Meds ordered this encounter  Medications  . DISCONTD: Oxycodone HCl 10 MG TABS    Sig: Take 1 tablet (10 mg total) by mouth every 4 (four) hours while awake.    Dispense:  150 tablet    Refill:  0    Do not fill until [id number]  . Oxycodone HCl 10 MG TABS    Sig: Take 1 tablet (10 mg total) by mouth  every 4 (four) hours while awake.    Dispense:  120 tablet    Refill:  0    Do not fill until [id number]   Patient's Medications  New Prescriptions   No medications on file  Previous Medications   ALPRAZOLAM (XANAX) 2 MG TABLET  AMPHETAMINE-DEXTROAMPHETAMINE (ADDERALL) 30 MG TABLET    Take 30 mg by mouth 2 (two) times daily.   BUSPIRONE (BUSPAR) 30 MG TABLET    Take 30 mg by mouth 2 (two) times daily.    DEXLANSOPRAZOLE (DEXILANT) 60 MG CAPSULE    Take 1 capsule (60 mg total) by mouth daily.   HYDROXYZINE (ATARAX/VISTARIL) 25 MG TABLET    Take 25 mg by mouth every 6 (six) hours as needed for anxiety or itching (hives).    METHOCARBAMOL (ROBAXIN) 500 MG TABLET    Take 1 tablet (500 mg total) by mouth 4 (four) times daily.   NALOXONE (NARCAN) NASAL SPRAY 4 MG/0.1 ML    For respiratory suppression from opioids   OXYCODONE (ROXICODONE) 5 MG IMMEDIATE RELEASE TABLET    Take 1 tablet (5 mg total) by mouth every 6 (six) hours as needed for severe pain.   POLYETHYLENE GLYCOL (MIRALAX / GLYCOLAX) PACKET    Take 17 g by mouth daily.   REXULTI 1 MG TABS       ROSUVASTATIN (CRESTOR) 10 MG TABLET    Take 1 tablet (10 mg total) by mouth daily.   SERTRALINE (ZOLOFT) 100 MG TABLET    Take 100 mg by mouth 2 (two) times daily.    TIOTROPIUM (SPIRIVA HANDIHALER) 18 MCG INHALATION CAPSULE    Place 1 capsule (18 mcg total) into inhaler and inhale daily.  Modified Medications   Modified Medication Previous Medication   OXYCODONE HCL 10 MG TABS Oxycodone HCl 10 MG TABS      Take 1 tablet (10 mg total) by mouth every 4 (four) hours while awake.    Take 1 tablet (10 mg total) by mouth every 4 (four) hours while awake.  Discontinued Medications   ARIPIPRAZOLE (ABILIFY) 15 MG TABLET    Take 15 mg by mouth daily.   CLONAZEPAM (KLONOPIN) 1 MG TABLET    Take 1 mg by mouth 6 (six) times daily.    CYCLOBENZAPRINE (FLEXERIL) 10 MG TABLET    TAKE 1 TABLET BY MOUTH TWICE A DAY   NICOTINE (NICODERM CQ) 14 MG/24HR PATCH     Place 1 patch (14 mg total) onto the skin daily.   TRAZODONE (DESYREL) 150 MG TABLET    Take 150 mg by mouth at bedtime as needed for sleep.   ZIPRASIDONE (GEODON) 40 MG CAPSULE       ----------------------------------------------------------------------------------------------------------------------  Follow-up: Return in about 2 months (around 09/01/2017) for evaluation, med refill.    Molli Barrows, MD

## 2017-07-04 NOTE — Patient Instructions (Signed)
You have been given 2 scripts for oxycodone today.  

## 2017-07-04 NOTE — Progress Notes (Signed)
Nursing Pain Medication Assessment:  Safety precautions to be maintained throughout the outpatient stay will include: orient to surroundings, keep bed in low position, maintain call bell within reach at all times, provide assistance with transfer out of bed and ambulation.  Medication Inspection Compliance: Pill count conducted under aseptic conditions, in front of the patient. Neither the pills nor the bottle was removed from the patient's sight at any time. Once count was completed pills were immediately returned to the patient in their original bottle.  Medication: Oxycodone IR Pill/Patch Count: 13 of 120 pills remain Pill/Patch Appearance: Markings consistent with prescribed medication Bottle Appearance: Standard pharmacy container. Clearly labeled. Filled Date: 01 / 31 / 2019 Last Medication intake:  Today

## 2017-07-07 ENCOUNTER — Ambulatory Visit (INDEPENDENT_AMBULATORY_CARE_PROVIDER_SITE_OTHER): Payer: Medicare Other

## 2017-07-07 VITALS — BP 134/82 | HR 80 | Temp 97.6°F | Resp 16 | Ht 64.0 in | Wt 270.3 lb

## 2017-07-07 DIAGNOSIS — Z0000 Encounter for general adult medical examination without abnormal findings: Secondary | ICD-10-CM | POA: Diagnosis not present

## 2017-07-07 NOTE — Patient Instructions (Addendum)
Wendy Johnson , Thank you for taking time to come for your Medicare Wellness Visit. I appreciate your ongoing commitment to your health goals. Please review the following plan we discussed and let me know if I can assist you in the future.   Screening recommendations/referrals: Colorectal Screening: Completed flex sig 12/05/16. Completed colonoscopy 09/01/16. Repeat every 3 years.  Mammogram: Completed 05/10/16. Repeat every year. Please call [phone] to schedule your mammogram.  Bone Density: Does not qualify due to age Lung Cancer Screening: You do not qualify for this screening Hepatitis C Screening: Completed 11/25/13  Vision/Dental/Diabetic Exams: Diabetic Exams: Recommend annual diabetic eye exams for retinopathy and diabetic foot exams.  Diabetic Eye Exam: Completed 08/28/14. Pt has been advised to call her ophthalmologist for completion. Diabetic Foot Exam: Completed 01/19/17.  Recommended yearly ophthalmology/optometry visit for glaucoma screening and checkup Recommended yearly dental visit for hygiene and checkup  Vaccinations: Influenza vaccine: Up to date Pneumococcal vaccine: Up todate Tdap vaccine: Up to date Shingles vaccine: Not required at this time    Advanced directives: Advance directive discussed with you today. I have provided a copy for you to complete at home and have notarized. Once this is complete please bring a copy in to our office so we can scan it into your chart.  Conditions/risks identified: Recommend to decrease portion sizes by eating 3 small healthy meals and at least 2 healthy snacks per day.  Next appointment: Please schedule an appointment with Dr. Ancil Boozer within the next 30 days for follow up after today's Annual Wellness Visit.  Please schedule your Annual Wellness Visit with your Nurse Health Advisor in one year.  Preventive Care 40-64 Years, Female Preventive care refers to lifestyle choices and visits with your health care provider that can promote  health and wellness. What does preventive care include?  A yearly physical exam. This is also called an annual well check.  Dental exams once or twice a year.  Routine eye exams. Ask your health care provider how often you should have your eyes checked.  Personal lifestyle choices, including:  Daily care of your teeth and gums.  Regular physical activity.  Eating a healthy diet.  Avoiding tobacco and drug use.  Limiting alcohol use.  Practicing safe sex.  Taking low-dose aspirin daily starting at age 41.  Taking vitamin and mineral supplements as recommended by your health care provider. What happens during an annual well check? The services and screenings done by your health care provider during your annual well check will depend on your age, overall health, lifestyle risk factors, and family history of disease. Counseling  Your health care provider may ask you questions about your:  Alcohol use.  Tobacco use.  Drug use.  Emotional well-being.  Home and relationship well-being.  Sexual activity.  Eating habits.  Work and work Statistician.  Method of birth control.  Menstrual cycle.  Pregnancy history. Screening  You may have the following tests or measurements:  Height, weight, and BMI.  Blood pressure.  Lipid and cholesterol levels. These may be checked every 5 years, or more frequently if you are over 26 years old.  Skin check.  Lung cancer screening. You may have this screening every year starting at age 37 if you have a 30-pack-year history of smoking and currently smoke or have quit within the past 15 years.  Fecal occult blood test (FOBT) of the stool. You may have this test every year starting at age 37.  Flexible sigmoidoscopy or colonoscopy. You may  have a sigmoidoscopy every 5 years or a colonoscopy every 10 years starting at age 35.  Hepatitis C blood test.  Hepatitis B blood test.  Sexually transmitted disease (STD)  testing.  Diabetes screening. This is done by checking your blood sugar (glucose) after you have not eaten for a while (fasting). You may have this done every 1-3 years.  Mammogram. This may be done every 1-2 years. Talk to your health care provider about when you should start having regular mammograms. This may depend on whether you have a family history of breast cancer.  BRCA-related cancer screening. This may be done if you have a family history of breast, ovarian, tubal, or peritoneal cancers.  Pelvic exam and Pap test. This may be done every 3 years starting at age 11. Starting at age 36, this may be done every 5 years if you have a Pap test in combination with an HPV test.  Bone density scan. This is done to screen for osteoporosis. You may have this scan if you are at high risk for osteoporosis. Discuss your test results, treatment options, and if necessary, the need for more tests with your health care provider. Vaccines  Your health care provider may recommend certain vaccines, such as:  Influenza vaccine. This is recommended every year.  Tetanus, diphtheria, and acellular pertussis (Tdap, Td) vaccine. You may need a Td booster every 10 years.  Zoster vaccine. You may need this after age 72.  Pneumococcal 13-valent conjugate (PCV13) vaccine. You may need this if you have certain conditions and were not previously vaccinated.  Pneumococcal polysaccharide (PPSV23) vaccine. You may need one or two doses if you smoke cigarettes or if you have certain conditions. Talk to your health care provider about which screenings and vaccines you need and how often you need them. This information is not intended to replace advice given to you by your health care provider. Make sure you discuss any questions you have with your health care provider. Document Released: 05/22/2015 Document Revised: 01/13/2016 Document Reviewed: 02/24/2015 Elsevier Interactive Patient Education  2017 [place] Prevention in the Home Falls can cause injuries. They can happen to people of all ages. There are many things you can do to make your home safe and to help prevent falls. What can I do on the outside of my home?  Regularly fix the edges of walkways and driveways and fix any cracks.  Remove anything that might make you trip as you walk through a door, such as a raised step or threshold.  Trim any bushes or trees on the path to your home.  Use bright outdoor lighting.  Clear any walking paths of anything that might make someone trip, such as rocks or tools.  Regularly check to see if handrails are loose or broken. Make sure that both sides of any steps have handrails.  Any raised decks and porches should have guardrails on the edges.  Have any leaves, snow, or ice cleared regularly.  Use sand or salt on walking paths during winter.  Clean up any spills in your garage right away. This includes oil or grease spills. What can I do in the bathroom?  Use night lights.  Install grab bars by the toilet and in the tub and shower. Do not use towel bars as grab bars.  Use non-skid mats or decals in the tub or shower.  If you need to sit down in the shower, use a plastic, non-slip stool.  Keep the floor dry. Clean up any water that spills on the floor as soon as it happens.  Remove soap buildup in the tub or shower regularly.  Attach bath mats securely with double-sided non-slip rug tape.  Do not have throw rugs and other things on the floor that can make you trip. What can I do in the bedroom?  Use night lights.  Make sure that you have a light by your bed that is easy to reach.  Do not use any sheets or blankets that are too big for your bed. They should not hang down onto the floor.  Have a firm chair that has side arms. You can use this for support while you get dressed.  Do not have throw rugs and other things on the floor that can make you trip. What can I  do in the kitchen?  Clean up any spills right away.  Avoid walking on wet floors.  Keep items that you use a lot in easy-to-reach places.  If you need to reach something above you, use a strong step stool that has a grab bar.  Keep electrical cords out of the way.  Do not use floor polish or wax that makes floors slippery. If you must use wax, use non-skid floor wax.  Do not have throw rugs and other things on the floor that can make you trip. What can I do with my stairs?  Do not leave any items on the stairs.  Make sure that there are handrails on both sides of the stairs and use them. Fix handrails that are broken or loose. Make sure that handrails are as long as the stairways.  Check any carpeting to make sure that it is firmly attached to the stairs. Fix any carpet that is loose or worn.  Avoid having throw rugs at the top or bottom of the stairs. If you do have throw rugs, attach them to the floor with carpet tape.  Make sure that you have a light switch at the top of the stairs and the bottom of the stairs. If you do not have them, ask someone to add them for you. What else can I do to help prevent falls?  Wear shoes that:  Do not have high heels.  Have rubber bottoms.  Are comfortable and fit you well.  Are closed at the toe. Do not wear sandals.  If you use a stepladder:  Make sure that it is fully opened. Do not climb a closed stepladder.  Make sure that both sides of the stepladder are locked into place.  Ask someone to hold it for you, if possible.  Clearly mark and make sure that you can see:  Any grab bars or handrails.  First and last steps.  Where the edge of each step is.  Use tools that help you move around (mobility aids) if they are needed. These include:  Canes.  Walkers.  Scooters.  Crutches.  Turn on the lights when you go into a dark area. Replace any light bulbs as soon as they burn out.  Set up your furniture so you have a  clear path. Avoid moving your furniture around.  If any of your floors are uneven, fix them.  If there are any pets around you, be aware of where they are.  Review your medicines with your doctor. Some medicines can make you feel dizzy. This can increase your chance of falling. Ask your doctor what other things that you can  do to help prevent falls. This information is not intended to replace advice given to you by your health care provider. Make sure you discuss any questions you have with your health care provider. Document Released: 02/19/2009 Document Revised: 10/01/2015 Document Reviewed: 05/30/2014 Elsevier Interactive Patient Education  2017 [place] provider would like to you have your annual eye exam. Please contact your current eye doctor to schedule your annual eye exam to evaluate the health of your eyes. If you have not yet established care with a physician to evaluate the health of your eyes, below is a list of physicians for you to contact and to establish care.   [facility] Address: [address] [place], [place] [address]   Address: [address], [place], [place] [address]  Phone: [phone]      Phone: [phone]  Website: [email or web address]    Website: [email or web address]     [facility]  Address: [place], [place], [place] [address]   Address: [place], [place], [place] [address] Phone: [phone]      Phone: [phone]    [facility] Address: [place], [place], [place] [address]  Phone: [phone]

## 2017-07-07 NOTE — Progress Notes (Addendum)
Subjective:   Wendy Johnson is a 52 y.o. female who presents for Medicare Annual (Subsequent) preventive examination.  Review of Systems:  N/A Cardiac Risk Factors include: dyslipidemia;family history of premature cardiovascular disease;obesity (BMI >30kg/m2)     Objective:     Vitals: BP 134/82 (BP Location: Left Arm, Patient Position: Sitting, Cuff Size: Large)   Pulse 80   Temp 97.6 F (36.4 C) (Oral)   Resp 16   Ht 5\' 4"  (1.626 m)   Wt 270 lb 4.8 oz (122.6 kg)   LMP 09/26/2014 Comment: Total  BMI 46.40 kg/m   Body mass index is 46.4 kg/m.  Advanced Directives 07/07/2017 07/04/2017 06/08/2017 06/07/2017 06/07/2017 05/04/2017 03/10/2017  Does Patient Have a Medical Advance Directive? No No No - No No No  Would patient like information on creating a medical advance directive? Yes (MAU/Ambulatory/Procedural Areas - Information given) No - Patient declined No - Patient declined (No Data) Yes (MAU/Ambulatory/Procedural Areas - Information given) No - Patient declined -    Tobacco Social History   Tobacco Use  Smoking Status Former Smoker  . Packs/day: 0.50  . Years: 37.00  . Pack years: 18.50  . Types: Cigarettes  . Start date: 11/26/1979  . Last attempt to quit: 04/19/2017  . Years since quitting: 0.2  Smokeless Tobacco Never Used  Tobacco Comment   smoking cessation materials not required     Counseling given: No Comment: smoking cessation materials not required   Clinical Intake:  Pre-visit preparation completed: Yes  Pain : No/denies pain   BMI - recorded: 46.4 Nutritional Status: BMI > 30  Obese Nutritional Risks: Unintentional weight gain Diabetes: No CBG done?: No Did pt. bring in CBG monitor from home?: No  How often do you need to have someone help you when you read instructions, pamphlets, or other written materials from your doctor or pharmacy?: 1 - Never  Interpreter Needed?: No  Information entered by :: AEversole, LPN  Hospitalizations,  surgeries, and ER visits occurring within the previous 12 months:  Pt underwent the following surgeries:  06/08/17 @ [facility] for L carpal tunnel release with L de Quervain's release by Dr. Rudene Christians. 11/28/16 @ [facility] for flex sig with Dr. Roselyn Reef 10/11/16 @ [facility] for R total knee revision with Dr. Rudene Christians 09/01/16 @ [facility] for colonoscopy and EGD with Dr. Vicente Males  In addition to the above listed surgeries, pt had the following ED visits: 10/09/16 @ [facility] on 10/09/16 for pain L middle finger  Pt was NOT seen in follow up by PCP for any of the above admissions.  Past Medical History:  Diagnosis Date  . ADHD (attention deficit hyperactivity disorder)   . Anxiety   . AR (allergic rhinitis)   . Arthritis   . Benign hypertension    NO MEDS  . Bipolar disorder ([place])   . Chronic back pain   . Chronic constipation   . Chronic insomnia   . COPD (chronic obstructive pulmonary disease) ([place])   . Deaf    RIGHT EAR  . Decreased dorsalis pedis pulse   . Depression   . Diabetes mellitus without complication ([place])    PATIENT DENIES  . Dyslipidemia   . Fatty liver   . GERD (gastroesophageal reflux disease)   . Hepatomegaly   . High cholesterol   . Hot flashes   . Migraine with aura   . OCD (obsessive compulsive disorder)   . Plantar warts   . Severe obesity ([place])   . Shortness of  breath dyspnea   . Sleep apnea    NO CPAP  . Tobacco use   . Trochanteric bursitis of right hip    Past Surgical History:  Procedure Laterality Date  . ABDOMINAL HYSTERECTOMY N/A 10/20/2014   Procedure: Total abdominial hysterectomy, bilateral salpingo-oophorectomy;  Surgeon: Brayton Mars, MD;  Location: [facility] ORS;  Service: Gynecology;  Laterality: N/A;  . BILATERAL SALPINGOOPHORECTOMY    . bone spurs removed Bilateral   . CARPAL TUNNEL RELEASE Left 06/08/2017   Procedure: CARPAL TUNNEL RELEASE;  Surgeon: Hessie Knows, MD;  Location: [facility] ORS;  Service: Orthopedics;  Laterality: Left;  . COLONOSCOPY WITH PROPOFOL N/A  09/01/2016   Procedure: COLONOSCOPY WITH PROPOFOL;  Surgeon: Jonathon Bellows, MD;  Location: [facility] END[facility];  Service: Endoscopy;  Laterality: N/A;  . DORSAL COMPARTMENT RELEASE Left 06/08/2017   Procedure: RELEASE DORSAL COMPARTMENT (DEQUERVAIN);  Surgeon: Hessie Knows, MD;  Location: [facility] OR[facility];  Service: Orthopedics;  Laterality: Left;  . ESOPHAGOGASTRODUODENOSCOPY (EGD) WITH PROPOFOL N/A 09/01/2016   Procedure: ESOPHAGOGASTRODUODENOSCOPY (EGD) WITH PROPOFOL;  Surgeon: Jonathon Bellows, MD;  Location: [facility] END[facility];  Service: Endoscopy;  Laterality: N/A;  . FOOT SURGERY Bilateral   . JOINT REPLACEMENT Right    Total Knee replacement X 2  . JOINT REPLACEMENT Bilateral    Total Hip Replacement  . knee arthroscopo Right   . LAPAROSCOPY  09/22/2014   Procedure: LAPAROSCOPY OPERATIVE;  Surgeon: Brayton Mars, MD;  Location: [facility] OR[facility];  Service: Gynecology;;  excision and fulgeration of endomertriosis  . LIPOMA EXCISION    . TONSILLECTOMY    . TOTAL HIP ARTHROPLASTY Left 12/16/2014   Procedure: TOTAL HIP ARTHROPLASTY ANTERIOR APPROACH;  Surgeon: Hessie Knows, MD;  Location: [facility];  Service: Orthopedics;  Laterality: Left;  . TOTAL HIP ARTHROPLASTY Right 05/12/2015   Procedure: TOTAL HIP ARTHROPLASTY ANTERIOR APPROACH;  Surgeon: Hessie Knows, MD;  Location: [facility];  Service: Orthopedics;  Laterality: Right;  . TOTAL KNEE REVISION Right 09/22/2015   Procedure: TOTAL KNEE REVISION/ REVISE POLYIETHYLENE;  Surgeon: Hessie Knows, MD;  Location: [facility];  Service: Orthopedics;  Laterality: Right;  . TOTAL KNEE REVISION Right 10/11/2016   Procedure: TOTAL KNEE REVISION;  Surgeon: Hessie Knows, MD;  Location: [facility] OR[facility];  Service: Orthopedics;  Laterality: Right;  . TUBAL LIGATION     Family History  Problem Relation Age of Onset  . Diabetes Mother   . Heart disease Father   . Heart attack Father   . Diabetes Sister   . Breast cancer Maternal Aunt        <50  . Breast cancer Paternal Aunt        x2.  <50   . Healthy Son   . Drug abuse Sister    Social History   Socioeconomic History  . Marital status: Divorced    Spouse name: None  . Number of children: 1  . Years of education: None  . Highest education level: 10th grade  Social Needs  . Financial resource strain: Not hard at all  . Food insecurity - worry: Never true  . Food insecurity - inability: Never true  . Transportation needs - medical: No  . Transportation needs - non-medical: No  Occupational History  . Occupation: disabled  Tobacco Use  . Smoking status: Former Smoker    Packs/day: 0.50    Years: 37.00    Pack years: 18.50    Types: Cigarettes    Start date: 11/26/1979    Last attempt to quit: 04/19/2017  Years since quitting: 0.2  . Smokeless tobacco: Never Used  . Tobacco comment: smoking cessation materials not required  Substance and Sexual Activity  . Alcohol use: No    Alcohol/week: 0.0 oz  . Drug use: No    Comment: quit crack cocaine 2004  . Sexual activity: Yes    Partners: Male    Birth control/protection: None  Other Topics Concern  . None  Social History Narrative   Lives with boyfriend   Disable from psychiatric disease    Outpatient Encounter Medications as of 07/07/2017  Medication Sig  . alprazolam (XANAX) 2 MG tablet   . amphetamine-dextroamphetamine (ADDERALL) 30 MG tablet Take 30 mg by mouth 2 (two) times daily.  . busPIRone (BUSPAR) 30 MG tablet Take 30 mg by mouth 2 (two) times daily.   Marland Kitchen dexlansoprazole (DEXILANT) 60 MG capsule Take 1 capsule (60 mg total) by mouth daily.  . hydrOXYzine (ATARAX/VISTARIL) 25 MG tablet Take 25 mg by mouth every 6 (six) hours as needed for anxiety or itching (hives).   . methocarbamol (ROBAXIN) 500 MG tablet Take 1 tablet (500 mg total) by mouth 4 (four) times daily. (Patient taking differently: Take 500 mg by mouth 4 (four) times daily as needed for muscle spasms. )  . Oxycodone HCl 10 MG TABS Take 1 tablet (10 mg total) by mouth every 4 (four) hours  while awake.  . polyethylene glycol (MIRALAX / GLYCOLAX) packet Take 17 g by mouth daily.  . rosuvastatin (CRESTOR) 10 MG tablet Take 1 tablet (10 mg total) by mouth daily.  . sertraline (ZOLOFT) 100 MG tablet Take 100 mg by mouth 2 (two) times daily.   Marland Kitchen tiotropium (SPIRIVA HANDIHALER) 18 MCG inhalation capsule Place 1 capsule (18 mcg total) into inhaler and inhale daily.  . traZODone (DESYREL) 150 MG tablet Take 150 mg by mouth 2 (two) times daily.  . naloxone (NARCAN) nasal spray 4 mg/0.1 mL For respiratory suppression from opioids  . oxyCODONE (ROXICODONE) 5 MG immediate release tablet Take 1 tablet (5 mg total) by mouth every 6 (six) hours as needed for severe pain.  Marland Kitchen REXULTI 1 MG TABS    No facility-administered encounter medications on file as of 07/07/2017.     Activities of Daily Living In your present state of health, do you have any difficulty performing the following activities: 07/07/2017 06/07/2017  Hearing? Y (No Data)  Comment denies hearing aids; deaf in R ear Walker? N N  Comment denies eyeglasses -  Difficulty concentrating or making decisions? N N  Walking or climbing stairs? Y Y  Comment joint pain  HIP AND KNEE REPLACEMENTS  Dressing or bathing? N N  Doing errands, shopping? N -  Preparing Food and eating ? N -  Comment denies dentures -  Using the Toilet? N -  In the past six months, have you accidently leaked urine? N -  Do you have problems with loss of bowel control? N -  Managing your Medications? N -  Managing your Finances? N -  Housekeeping or managing your Housekeeping? N -  Some recent data might be hidden    Patient Care Team: Steele Sizer, MD as PCP - General (Family Medicine) Myer Haff, MD as Consulting Physician (Psychiatry) Andree Elk Alvina Filbert, MD as Consulting Physician (Anesthesiology) Jonathon Bellows, MD as Consulting Physician (Gastroenterology) Hessie Knows, MD as Consulting Physician (Orthopedic Surgery) Jules Husbands, MD as  Consulting Physician (General Surgery)    Assessment:   This is  a routine wellness examination for Joshalyn.  Exercise Activities and Dietary recommendations Current Exercise Habits: Structured exercise class, Type of exercise: strength training/weights;Other - see comments(recumbant bike), Time (Minutes): 60, Frequency (Times/Week): 4, Weekly Exercise (Minutes/Week): 240, Intensity: Mild, Exercise limited by: None identified  Goals    None      Fall Risk Fall Risk  07/07/2017 07/04/2017 05/04/2017 04/21/2017 03/10/2017  Falls in the past year? No No No No No  Number falls in past yr: - - - - -  Injury with Fall? - - - - -  Comment - - - - -  Risk for fall due to : Medication side effect - - - -  Follow up - - - - -  Comment - - - - -   Is the patient's home free of loose throw rugs in walkways, pet beds, electrical cords, etc?   Yes Does the patient have any grab bars in the bathroom? Yes  Does the patient use a shower chair when bathing? Yes Does the patient have any stairs in or around the home? Yes If so, are there any handrails?  Yes Does the patient have adequate lighting?  Yes Does the patient use a cane, walker or w/c? No Does the patient use of an elevated toilet seat? Yes  Timed Get Up and Go Performed: Yes. Pt ambulated 10 feet within 5 sec. Gait stead-fast and without the use of an assistive device. No intervention required at this time. Fall risk prevention has been discussed.  Community Resource Referral not required to be sent at this time. Depression Screen PHQ 2/9 Scores 07/07/2017 07/04/2017 05/04/2017 01/10/2017  PHQ - 2 Score 0 0 2 0  PHQ- 9 Score - - 5 -  Exception Documentation - - - -     Cognitive Function     6CIT Screen 07/07/2017  What Year? 0 points  What month? 0 points  What time? 0 points  Count back from 20 0 points  Months in reverse 0 points  Repeat phrase 4 points  Total Score 4    Immunization History  Administered Date(s) Administered  .  Influenza,inj,Quad PF,6+ Mos 02/03/2016, 01/19/2017  . Influenza-Unspecified 01/09/2014, 02/22/2015  . Pneumococcal Conjugate-13 01/09/2014  . Pneumococcal Polysaccharide-23 11/09/2009  . Tdap 11/19/2009    Qualifies for Shingles Vaccine? No due to age  Screening Tests Health Maintenance  Topic Date Due  . OPHTHALMOLOGY EXAM  08/08/1977  . MAMMOGRAM  05/10/2017  . URINE MICROALBUMIN  07/28/2017  . HEMOGLOBIN A1C  10/20/2017  . FOOT EXAM  01/19/2018  . COLONOSCOPY  09/02/2019  . TETANUS/TDAP  11/20/2019  . PNEUMOCOCCAL POLYSACCHARIDE VACCINE  Completed  . INFLUENZA VACCINE  Completed  . HIV Screening  Completed   Cancer Screenings: Colorectal Screening: Completed flex sig 12/05/16. Completed colonoscopy 09/01/16. Repeat every 3 years.  Mammogram: Completed 05/10/16. Repeat every year. Dr. Ancil Boozer ordered this screening on 04/21/17. Education has been provided regarding the importance of this screening. The patient has been provided with contact information to schedule her own appointment. Bone Density: Does not qualify due to age Lung Cancer Screening: (Low Dose CT Chest recommended if Age 19-80 years, 30 pack-year currently smoking OR have quit w/in 15years.) Does not qualify.   Additional Screenings: Hepatitis C Screening: Completed 11/25/13  Diabetic Exams: Diabetic Eye Exam: Completed 08/28/14. Pt has been advised to call her ophthalmologist for completion. Diabetic Foot Exam: Completed 01/19/17.     Plan:  I have personally reviewed and addressed the  Medicare Annual Wellness questionnaire and have noted the following in the patient's chart:  A. Medical and social history B. Use of alcohol, tobacco or illicit drugs  C. Current medications and supplements D. Functional ability and status E.  Nutritional status F.  Physical activity G. Advance directives H. List of other physicians I.  Hospitalizations, surgeries, and ER visits in previous 12 months J.  [place]  such as hearing and vision if needed, cognitive and depression L. Referrals and appointments - none  In addition, I have reviewed and discussed with patient certain preventive protocols, quality metrics, and best practice recommendations. A written personalized care plan for preventive services as well as general preventive health recommendations were provided to patient.  See attached scanned questionnaire for additional information.   Signed,  Aleatha Borer, LPN Nurse Health Advisor  I have reviewed this encounter including the documentation in this note and/or discussed this patient with the provider, Aleatha Borer, LPN. I am certifying that I agree with the content of this note as supervising physician.  Steele Sizer, MD [facility] 07/07/2017, 5:14 PM

## 2017-07-10 DIAGNOSIS — Z9889 Other specified postprocedural states: Secondary | ICD-10-CM | POA: Diagnosis not present

## 2017-07-17 ENCOUNTER — Ambulatory Visit: Payer: Medicare Other | Admitting: Family Medicine

## 2017-07-17 ENCOUNTER — Other Ambulatory Visit: Payer: Self-pay | Admitting: Family Medicine

## 2017-07-17 ENCOUNTER — Other Ambulatory Visit: Payer: Self-pay

## 2017-07-17 DIAGNOSIS — K219 Gastro-esophageal reflux disease without esophagitis: Secondary | ICD-10-CM

## 2017-07-17 MED ORDER — DEXLANSOPRAZOLE 60 MG PO CPDR: 60.0000 mg | DELAYED_RELEASE_CAPSULE | Freq: Every day | ORAL | 5 refills | Status: DC

## 2017-07-17 NOTE — Telephone Encounter (Signed)
Copied from [place] [id number]. Topic: General - Other >> Jul 17, 2017  2:36 PM Darl Householder, RMA wrote: Reason for CRM: Medication refill request for dexlansoprazole (DEXILANT) 60 MG capsule to be sent to [facility]

## 2017-07-17 NOTE — Telephone Encounter (Signed)
Refill request for general medication. Dexilant to [facility] office visit: 04/21/2017   Follow up on 07/17/2017

## 2017-07-19 ENCOUNTER — Ambulatory Visit (INDEPENDENT_AMBULATORY_CARE_PROVIDER_SITE_OTHER): Payer: Medicare Other | Admitting: Family Medicine

## 2017-07-19 ENCOUNTER — Ambulatory Visit: Payer: Self-pay | Admitting: Surgery

## 2017-07-19 ENCOUNTER — Encounter: Payer: Self-pay | Admitting: Family Medicine

## 2017-07-19 VITALS — BP 130/70 | HR 111 | Resp 28 | Ht 64.0 in | Wt 273.8 lb

## 2017-07-19 DIAGNOSIS — E669 Obesity, unspecified: Secondary | ICD-10-CM | POA: Diagnosis not present

## 2017-07-19 DIAGNOSIS — T402X5A Adverse effect of other opioids, initial encounter: Secondary | ICD-10-CM

## 2017-07-19 DIAGNOSIS — M4696 Unspecified inflammatory spondylopathy, lumbar region: Secondary | ICD-10-CM

## 2017-07-19 DIAGNOSIS — F1021 Alcohol dependence, in remission: Secondary | ICD-10-CM | POA: Diagnosis not present

## 2017-07-19 DIAGNOSIS — J069 Acute upper respiratory infection, unspecified: Secondary | ICD-10-CM | POA: Diagnosis not present

## 2017-07-19 DIAGNOSIS — F3131 Bipolar disorder, current episode depressed, mild: Secondary | ICD-10-CM | POA: Diagnosis not present

## 2017-07-19 DIAGNOSIS — M2550 Pain in unspecified joint: Secondary | ICD-10-CM

## 2017-07-19 DIAGNOSIS — E785 Hyperlipidemia, unspecified: Secondary | ICD-10-CM

## 2017-07-19 DIAGNOSIS — J441 Chronic obstructive pulmonary disease with (acute) exacerbation: Secondary | ICD-10-CM | POA: Diagnosis not present

## 2017-07-19 DIAGNOSIS — E1169 Type 2 diabetes mellitus with other specified complication: Secondary | ICD-10-CM

## 2017-07-19 DIAGNOSIS — Z79899 Other long term (current) drug therapy: Secondary | ICD-10-CM

## 2017-07-19 DIAGNOSIS — M47816 Spondylosis without myelopathy or radiculopathy, lumbar region: Secondary | ICD-10-CM

## 2017-07-19 DIAGNOSIS — K5903 Drug induced constipation: Secondary | ICD-10-CM | POA: Diagnosis not present

## 2017-07-19 DIAGNOSIS — Z6841 Body Mass Index (BMI) 40.0 and over, adult: Secondary | ICD-10-CM

## 2017-07-19 MED ORDER — LORCASERIN HCL ER 20 MG PO TB24: 1.0000 | ORAL_TABLET | Freq: Every day | ORAL | 2 refills | Status: DC

## 2017-07-19 MED ORDER — LUBIPROSTONE 8 MCG PO CAPS: 8.0000 ug | ORAL_CAPSULE | Freq: Two times a day (BID) | ORAL | 0 refills | Status: DC

## 2017-07-19 MED ORDER — IBUPROFEN 800 MG PO TABS: 800.0000 mg | ORAL_TABLET | Freq: Three times a day (TID) | ORAL | 0 refills | Status: DC | PRN

## 2017-07-19 MED ORDER — FLUTICASONE PROPIONATE 50 MCG/ACT NA SUSP: 2.0000 | Freq: Every day | NASAL | 0 refills | Status: DC

## 2017-07-19 MED ORDER — PREDNISONE 20 MG PO TABS: 20.0000 mg | ORAL_TABLET | Freq: Every day | ORAL | 0 refills | Status: DC

## 2017-07-19 MED ORDER — TIOTROPIUM BROMIDE MONOHYDRATE 18 MCG IN CAPS: 18.0000 ug | ORAL_CAPSULE | Freq: Every day | RESPIRATORY_TRACT | 5 refills | Status: DC

## 2017-07-19 NOTE — Patient Instructions (Signed)

## 2017-07-19 NOTE — Progress Notes (Signed)
Name: Wendy Johnson   MRN: [id number]    DOB: August 23, 1965   Date:07/19/2017       Progress Note  Subjective  Chief Complaint  Chief Complaint  Patient presents with  . Medication Refill  . Diabetes    HPI  DM: diet controlled with dyslipidemia: needs yearly eye exam. Denies polyphagia, polydipsia or polyuria. She still has low HDL, LDL elevated and triglycerides up. We will recheck labs.   Dyslipidemia:  She has high triglycerides , low HDl and high LDL, she is taking statin therapy, and trying life style modification. We will recheck labs  DDD lumbar spine: she sees pain clinic - Dr Andree Elk, on Methocarbamol  and  Oxycodone, pain level is 7/10 at this time, pain is on her back, neck some on shoulder and right knee, she also has wrist pain. She would like a refill of Ibuprofen. Explained she should only take it prn She has constipation from opiates and we will try Amitiza  GERD: taking Dexilant and seen by Dr. Vicente Males, no longer has heartburn or regurgitation, very seldom has symptoms.   Morbid obesity: her maximum weight was around 273 lbs, she lost down to 184 lbs with physical activity and life style modification, but over the past couple of years she started to gain her weight back, started after knee and hip surgery, also states medication for bipolar makes her gain weight. She is back on life style modification, off sodas, drinking Slim fast, water, eating salads and has been unable to lose weight. She was on Ozempic but stopped on her own because it did not help curb her appetite. She asked about oral medications, explained not covered by insurance and advised her to seek care at local bariatric center. She would like to try Belviq   Bipolar disorder: on multiple medications , given by Dr. Kasandra Knudsen . She is upset about her weight but states medications works well for her. Going to church, not drinking, states medications are good  COPD flare: she switched back from Anoro ( did not like  the powder sensation in her mouth) to Spiriva, and was doing well, still uses oxygen at night, however had an URI about 1 week ago and has some facial pressure and dry cough, no wheezing but no SOB. She has been afebrile, not taking otc medication. We will try prednisone, discussed possible side effects and the need to take it with food. Avoid NSAID's while on Prednisone    Patient Active Problem List   Diagnosis Date Noted  . History of alcoholism ([place]) 07/19/2017  . Bilateral carpal tunnel syndrome 05/29/2017  . Tenosynovitis, de Quervain 05/29/2017  . Painful total knee replacement, right ([place]) 10/11/2016  . Rotator cuff tendinitis, right 07/29/2016  . Occipital neuralgia 10/12/2015  . Medication overuse headache 10/12/2015  . Instability of prosthetic knee ([place]) 09/22/2015  . Primary osteoarthritis of right hip 05/12/2015  . Sleep apnea 04/07/2015  . Primary osteoarthritis of left hip 12/16/2014  . Metabolic syndrome 45/07/8880  . Migraine without aura and without status migrainosus, not intractable 11/26/2014  . GERD without esophagitis 11/26/2014  . COPD, moderate ([place]) 11/26/2014  . Nocturnal oxygen desaturation 11/26/2014  . Supplemental oxygen dependent 11/26/2014  . Hearing loss 11/26/2014  . Chronic insomnia 11/26/2014  . History of hypertension 11/26/2014  . Dyslipidemia 11/26/2014  . Morbid obesity ([place]) 11/26/2014  . Dyslipidemia associated with type 2 diabetes mellitus ([place]) 11/26/2014  . Chronic constipation 11/26/2014  . Generalized anxiety disorder 11/11/2014  .  DDD (degenerative disc disease), lumbar 11/11/2014  . Facet arthritis of lumbar region ([place]) 11/11/2014  . Primary osteoarthritis involving multiple joints 11/11/2014  . H/O hysterectomy for benign disease 10/20/2014  . History of endometriosis 10/16/2014  . Low back derangement syndrome 09/30/2014  . Degenerative joint disease of both lower legs 09/30/2014  . Bipolar disorder ([place]) 09/30/2014    Past  Surgical History:  Procedure Laterality Date  . ABDOMINAL HYSTERECTOMY N/A 10/20/2014   Procedure: Total abdominial hysterectomy, bilateral salpingo-oophorectomy;  Surgeon: Brayton Mars, MD;  Location: [facility];  Service: Gynecology;  Laterality: N/A;  . BILATERAL SALPINGOOPHORECTOMY    . bone spurs removed Bilateral   . CARPAL TUNNEL RELEASE Left 06/08/2017   Procedure: CARPAL TUNNEL RELEASE;  Surgeon: Hessie Knows, MD;  Location: [facility];  Service: Orthopedics;  Laterality: Left;  . COLONOSCOPY WITH PROPOFOL N/A 09/01/2016   Procedure: COLONOSCOPY WITH PROPOFOL;  Surgeon: Jonathon Bellows, MD;  Location: [facility];  Service: Endoscopy;  Laterality: N/A;  . DORSAL COMPARTMENT RELEASE Left 06/08/2017   Procedure: RELEASE DORSAL COMPARTMENT (DEQUERVAIN);  Surgeon: Hessie Knows, MD;  Location: [facility];  Service: Orthopedics;  Laterality: Left;  . ESOPHAGOGASTRODUODENOSCOPY (EGD) WITH PROPOFOL N/A 09/01/2016   Procedure: ESOPHAGOGASTRODUODENOSCOPY (EGD) WITH PROPOFOL;  Surgeon: Jonathon Bellows, MD;  Location: [facility];  Service: Endoscopy;  Laterality: N/A;  . FOOT SURGERY Bilateral   . JOINT REPLACEMENT Right    Total Knee replacement X 2  . JOINT REPLACEMENT Bilateral    Total Hip Replacement  . knee arthroscopo Right   . LAPAROSCOPY  09/22/2014   Procedure: LAPAROSCOPY OPERATIVE;  Surgeon: Brayton Mars, MD;  Location: [facility];  Service: Gynecology;;  excision and fulgeration of endomertriosis  . LIPOMA EXCISION    . TONSILLECTOMY    . TOTAL HIP ARTHROPLASTY Left 12/16/2014   Procedure: TOTAL HIP ARTHROPLASTY ANTERIOR APPROACH;  Surgeon: Hessie Knows, MD;  Location: [facility];  Service: Orthopedics;  Laterality: Left;  . TOTAL HIP ARTHROPLASTY Right 05/12/2015   Procedure: TOTAL HIP ARTHROPLASTY ANTERIOR APPROACH;  Surgeon: Hessie Knows, MD;  Location: [facility];  Service: Orthopedics;  Laterality: Right;  . TOTAL KNEE REVISION Right 09/22/2015   Procedure: TOTAL KNEE REVISION/  REVISE POLYIETHYLENE;  Surgeon: Hessie Knows, MD;  Location: [facility];  Service: Orthopedics;  Laterality: Right;  . TOTAL KNEE REVISION Right 10/11/2016   Procedure: TOTAL KNEE REVISION;  Surgeon: Hessie Knows, MD;  Location: [facility];  Service: Orthopedics;  Laterality: Right;  . TUBAL LIGATION      Family History  Problem Relation Age of Onset  . Diabetes Mother   . Heart disease Father   . Heart attack Father   . Diabetes Sister   . Breast cancer Maternal Aunt        <50  . Breast cancer Paternal Aunt        x2.  <50  . Healthy Son   . Drug abuse Sister     Social History   Socioeconomic History  . Marital status: Divorced    Spouse name: Not on file  . Number of children: 1  . Years of education: Not on file  . Highest education level: 10th grade  Social Needs  . Financial resource strain: Not hard at all  . Food insecurity - worry: Never true  . Food insecurity - inability: Never true  . Transportation needs - medical: No  . Transportation needs - non-medical: No  Occupational History  . Occupation: disabled  Tobacco Use  .  Smoking status: Former Smoker    Packs/day: 0.50    Years: 37.00    Pack years: 18.50    Types: Cigarettes    Start date: 11/26/1979    Last attempt to quit: 04/19/2017    Years since quitting: 0.2  . Smokeless tobacco: Never Used  . Tobacco comment: smoking cessation materials not required  Substance and Sexual Activity  . Alcohol use: No    Alcohol/week: 0.0 oz  . Drug use: No    Comment: quit crack cocaine 2004  . Sexual activity: Yes    Partners: Male    Birth control/protection: None  Other Topics Concern  . Not on file  Social History Narrative   Lives with boyfriend   Disable from psychiatric disease     Current Outpatient Medications:  .  alprazolam (XANAX) 2 MG tablet, Take 1 tablet by mouth 2 (two) times daily., Disp: , Rfl:  .  amphetamine-dextroamphetamine (ADDERALL) 30 MG tablet, Take 30 mg by mouth 2 (two) times  daily., Disp: , Rfl:  .  busPIRone (BUSPAR) 30 MG tablet, Take 30 mg by mouth 2 (two) times daily. , Disp: , Rfl:  .  dexlansoprazole (DEXILANT) 60 MG capsule, Take 1 capsule (60 mg total) by mouth daily., Disp: 30 capsule, Rfl: 5 .  hydrOXYzine (ATARAX/VISTARIL) 25 MG tablet, Take 25 mg by mouth every 6 (six) hours as needed for anxiety or itching (hives)., Disp: , Rfl:  .  naloxone (NARCAN) nasal spray 4 mg/0.1 mL, For respiratory suppression from opioids, Disp: 1 kit, Rfl: 2 .  Oxycodone HCl 10 MG TABS, Take 1 tablet (10 mg total) by mouth every 4 (four) hours while awake., Disp: 120 tablet, Rfl: 0 .  REXULTI 1 MG TABS, Take 1 tablet by mouth daily., Disp: , Rfl:  .  rosuvastatin (CRESTOR) 10 MG tablet, Take 1 tablet (10 mg total) by mouth daily., Disp: 30 tablet, Rfl: 7 .  sertraline (ZOLOFT) 100 MG tablet, Take 100 mg by mouth 2 (two) times daily., Disp: , Rfl:  .  tiotropium (SPIRIVA HANDIHALER) 18 MCG inhalation capsule, Place 1 capsule (18 mcg total) into inhaler and inhale daily., Disp: 30 capsule, Rfl: 5 .  traZODone (DESYREL) 150 MG tablet, Take 150 mg by mouth 2 (two) times daily., Disp: , Rfl:  .  fluticasone (FLONASE) 50 MCG/ACT nasal spray, Place 2 sprays into both nostrils daily., Disp: 16 g, Rfl: 0 .  ibuprofen (ADVIL,MOTRIN) 800 MG tablet, Take 1 tablet (800 mg total) by mouth every 8 (eight) hours as needed., Disp: 90 tablet, Rfl: 0 .  lubiprostone (AMITIZA) 8 MCG capsule, Take 1 capsule (8 mcg total) by mouth 2 (two) times daily with a meal., Disp: 60 capsule, Rfl: 0 .  methocarbamol (ROBAXIN) 500 MG tablet, Take 1 tablet (500 mg total) by mouth 4 (four) times daily. (Patient not taking: Reported on 07/19/2017), Disp: 120 tablet, Rfl: 3 .  predniSONE (DELTASONE) 20 MG tablet, Take 1 tablet (20 mg total) by mouth daily with breakfast., Disp: 10 tablet, Rfl: 0  Allergies  Allergen Reactions  . Codeine Nausea Only  . Imitrex [Sumatriptan] Other (See Comments)    Chest pain  .  Tylenol [Acetaminophen] Other (See Comments)    Stomach hurt     ROS  Constitutional: Negative for fever , positive for weight change.  Respiratory: Negative for cough and shortness of breath.   Cardiovascular: Negative for chest pain or palpitations.  Gastrointestinal: Negative for abdominal pain, no bowel changes ( she  has constipation )  Musculoskeletal: Negative for gait problem or joint swelling.  Skin: Negative for rash.  Neurological: Negative for dizziness or headache.  No other specific complaints in a complete review of systems (except as listed in HPI above).  Objective  Vitals:   07/19/17 0810  BP: 130/70  Pulse: (!) 111  Resp: (!) 28  SpO2: 99%  Weight: 273 lb 12.8 oz (124.2 kg)  Height: 5' 4" (1.626 m)    Body mass index is 47 kg/m.  Physical Exam  Constitutional: Patient appears well-developed and well-nourished. Obese No distress.  HEENT: head atraumatic, normocephalic, pupils equal and reactive to light, ears normal TM bilaterally, boggy turbinates,  neck supple, throat within normal limits Cardiovascular: Normal rate, regular rhythm and normal heart sounds.  No murmur heard. No BLE edema. Pulmonary/Chest: Effort normal she has expiratory rhonchi, No respiratory distress. Abdominal: Soft.  There is no tenderness. Psychiatric: Patient has a normal mood and affect. behavior is normal. Judgment and thought content normal.  Recent Results (from the past 2160 hour(s))  POCT HgB A1C     Status: Normal   Collection Time: 04/21/17  7:46 AM  Result Value Ref Range   Hemoglobin A1C 5.4   ToxASSURE Select 13 (MW), Urine     Status: None   Collection Time: 05/04/17  4:20 PM  Result Value Ref Range   Summary FINAL     Comment: ==================================================================== TOXASSURE SELECT 13 (MW) ==================================================================== Test                             Result       Flag       Units Drug  Present and Declared for Prescription Verification   7-aminoclonazepam              369          EXPECTED   ng/mg creat    7-aminoclonazepam is an expected metabolite of clonazepam. Source    of clonazepam is a scheduled prescription medication.   Oxycodone                      3845         EXPECTED   ng/mg creat   Oxymorphone                    1455         EXPECTED   ng/mg creat   Noroxycodone                   3035         EXPECTED   ng/mg creat   Noroxymorphone                 243          EXPECTED   ng/mg creat    Sources of oxycodone are scheduled prescription medications.    Oxymorphone, noroxycodone, and noroxymorphone are expected    metabolites of oxycodone. Oxymorphone is also available as a    scheduled prescription medication. Dru g Absent but Declared for Prescription Verification   Amphetamine                    Not Detected UNEXPECTED ng/mg creat ==================================================================== Test                      Result    Flag   Units  Ref Range   Creatinine              49               mg/dL      >=20 ==================================================================== Declared Medications:  The flagging and interpretation on this report are based on the  following declared medications.  Unexpected results may arise from  inaccuracies in the declared medications.  **Note: The testing scope of this panel includes these medications:  Amphetamine (Adderall)  Clonazepam (Klonopin)  Oxycodone  **Note: The testing scope of this panel does not include following  reported medications:  Buspirone (BuSpar)  Cyclobenzaprine (Flexeril)  Dexlansoprazole (Dexilant)  Hydroxyzine (Atarax)  Methocarbamol (Robaxin)  Naloxone (Narcan)  Nicotine (Nicoderm)  Polyethylene Gl ycol (MiraLAX)  Rosuvastatin (Crestor)  Sertraline (Zoloft)  Tiotropium (Spiriva) ==================================================================== For clinical  consultation, please call [phone]. ====================================================================      PHQ2/9: Depression screen [facility] 2/9 07/07/2017 07/04/2017 05/04/2017 01/10/2017 11/03/2016  Decreased Interest 0 0 1 0 0  Down, Depressed, Hopeless 0 0 1 0 0  PHQ - 2 Score 0 0 2 0 0  Altered sleeping - - 1 - -  Tired, decreased energy - - 1 - -  Change in appetite - - 1 - -  Feeling bad or failure about yourself  - - 0 - -  Trouble concentrating - - 0 - -  Moving slowly or fidgety/restless - - 0 - -  Suicidal thoughts - - 0 - -  PHQ-9 Score - - 5 - -  Difficult doing work/chores - - Somewhat difficult - -     Fall Risk: Fall Risk  07/19/2017 07/07/2017 07/04/2017 05/04/2017 04/21/2017  Falls in the past year? _0   Number falls in past yr: - - - - -  Injury with Fall? - - - - -  [place] for fall due to : - Medication side effect - - -  Follow up - - - - -  Comment - - - - -     Functional Status Survey: Is the patient deaf or have difficulty hearing?: No Does the patient have difficulty seeing, even when wearing glasses/contacts?: No Does the patient have difficulty concentrating, remembering, or making decisions?: No Does the patient have difficulty walking or climbing stairs?: No Does the patient have difficulty dressing or bathing?: No Does the patient have difficulty doing errands alone such as visiting a doctor's office or shopping?: No   Assessment & Plan  1. Diabetes mellitus type 2 in obese (HCC)  - POCT HgB A1C - Hemoglobin A1c  2. COPD with acute exacerbation ([place])  She states she does like Anoro because of powder, having URI, we will try prednisone po, fluids, rest and continue Spiriva - tiotropium (SPIRIVA HANDIHALER) 18 MCG inhalation capsule; Place 1 capsule (18 mcg total) into inhaler and inhale daily.  Dispense: 30 capsule; Refill: 5 - predniSONE (DELTASONE) 20 MG tablet; Take 1 tablet (20 mg total) by mouth daily  with breakfast.  Dispense: 10 tablet; Refill: 0  3. Facet arthritis of lumbar region [facility])  Continue follow up with pain clinic  - ibuprofen (ADVIL,MOTRIN) 800 MG tablet; Take 1 tablet (800 mg total) by mouth every 8 (eight) hours as needed.  Dispense: 90 tablet; Refill: 0  4. Dyslipidemia associated with type 2 diabetes mellitus ([place])  - Lipid panel  5. Morbid obesity with BMI of 40.0-44.9, adult [facility])  Discussed with the  patient the risk posed by an increased BMI. Discussed importance of portion control, calorie counting and at least 150 minutes of physical activity weekly. Avoid sweet beverages and drink more water. Eat at least 6 servings of fruit and vegetables daily  She wants to pay cash for weight loss medication, discussed options, we will try Belviq XR 20 mg daily   6. Bipolar affective disorder, currently depressed, mild ([place])  Seeing Dr. Su  7. Constipation due to opioid therapy  - lubiprostone (AMITIZA) 8 MCG capsule; Take 1 capsule (8 mcg total) by mouth 2 (two) times daily with a meal.  Dispense: 60 capsule; Refill: 0  8. Arthralgia, unspecified joint  Take ibuprofen prn, continue follow up with Ortho   9. URI, acute  - fluticasone (FLONASE) 50 MCG/ACT nasal spray; Place 2 sprays into both nostrils daily.  Dispense: 16 g; Refill: 0  10. Long-term use of high-risk medication  - COMPLETE METABOLIC PANEL WITH GFR - CBC with Differential/Platelet   11. History of alcoholism [facility])  She had a DUI 2012, she will get her license back April 9702  She is going to celebrate recovery at a [employer] .

## 2017-07-20 LAB — LIPID PANEL
CHOLESTEROL: 157 mg/dL (ref <200)
HDL: 30 mg/dL — ABNORMAL LOW (ref >50)
Non-HDL Cholesterol (Calc): 127 mg/dL (calc) (ref <130)
Total CHOL/HDL Ratio: 5.2 (calc) — ABNORMAL HIGH (ref <5.0)
Triglycerides: 405 mg/dL — ABNORMAL HIGH (ref <150)

## 2017-07-20 LAB — CBC WITH DIFFERENTIAL/PLATELET
BASOS PCT: 0.6 %
Basophils Absolute: 44 cells/uL (ref 0–200)
Eosinophils Absolute: 153 cells/uL (ref 15–500)
Eosinophils Relative: 2.1 %
HEMATOCRIT: 43 % (ref 35.0–45.0)
HEMOGLOBIN: 14.1 g/dL (ref 11.7–15.5)
LYMPHS ABS: 1708 {cells}/uL (ref 850–3900)
MCH: 26.9 pg — AB (ref 27.0–33.0)
MCHC: 32.8 g/dL (ref 32.0–36.0)
MCV: 81.9 fL (ref 80.0–100.0)
MPV: 11.8 fL (ref 7.5–12.5)
Monocytes Relative: 6.2 %
NEUTROS ABS: 4942 {cells}/uL (ref 1500–7800)
Neutrophils Relative %: 67.7 %
PLATELETS: 276 10*3/uL (ref 140–400)
RBC: 5.25 10*6/uL — AB (ref 3.80–5.10)
RDW: 14.1 % (ref 11.0–15.0)
TOTAL LYMPHOCYTE: 23.4 %
WBC: 7.3 10*3/uL (ref 3.8–10.8)
WBCMIX: 453 {cells}/uL (ref 200–950)

## 2017-07-20 LAB — COMPLETE METABOLIC PANEL WITH GFR
AG RATIO: 1.3 (calc) (ref 1.0–2.5)
ALBUMIN MSPROF: 4 g/dL (ref 3.6–5.1)
ALT: 21 U/L (ref 6–29)
AST: 15 U/L (ref 10–35)
Alkaline phosphatase (APISO): 109 U/L (ref 33–115)
BILIRUBIN TOTAL: 0.2 mg/dL (ref 0.2–1.2)
BUN: 14 mg/dL (ref 7–25)
CHLORIDE: 105 mmol/L (ref 98–110)
CO2: 23 mmol/L (ref 20–32)
Calcium: 9.4 mg/dL (ref 8.6–10.2)
Creat: 0.64 mg/dL (ref 0.50–1.10)
GFR, EST AFRICAN AMERICAN: 121 mL/min/{1.73_m2} (ref >=60)
GFR, Est Non African American: 105 mL/min/{1.73_m2} (ref >=60)
GLUCOSE: 228 mg/dL — AB (ref 65–99)
Globulin: 3 g/dL (calc) (ref 1.9–3.7)
POTASSIUM: 4 mmol/L (ref 3.5–5.3)
Sodium: 137 mmol/L (ref 135–146)
Total Protein: 7 g/dL (ref 6.1–8.1)

## 2017-07-20 LAB — HEMOGLOBIN A1C
Hgb A1c MFr Bld: 5.9 % of total Hgb — ABNORMAL HIGH (ref <5.7)
Mean Plasma Glucose: 123 (calc)
eAG (mmol/L): 6.8 (calc)

## 2017-07-24 ENCOUNTER — Other Ambulatory Visit: Payer: Self-pay | Admitting: Family Medicine

## 2017-07-24 MED ORDER — ROSUVASTATIN CALCIUM 20 MG PO TABS: 20.0000 mg | ORAL_TABLET | Freq: Every day | ORAL | 1 refills | Status: DC

## 2017-07-25 IMAGING — CT CT ABD-PELV W/ CM
1 of 3 series · 14 of 32 positions shown, 19 images · IV contrast (APPLIED)
Comparison: None.

CLINICAL DATA: Periumbilical pain for several months

EXAM:
CT ABDOMEN AND PELVIS WITH CONTRAST
TECHNIQUE: Multidetector CT imaging of the abdomen and pelvis was performed
using the standard protocol following bolus administration of
intravenous contrast.
CONTRAST:  100mL [id number] IOPAMIDOL ([id number]) INJECTION 61%

[Series 2: axial st · axial · 0.82mm/px · z∈[-1091,-656]mm · 14 of 99 slices shown, 19 images]
[im 6/99  soft-tissue]
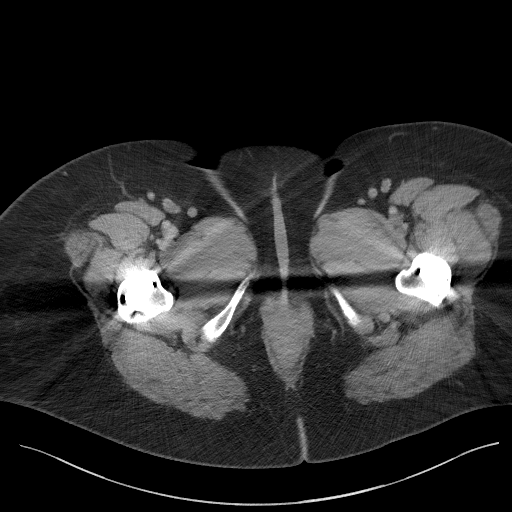
[im 6/99  bone]
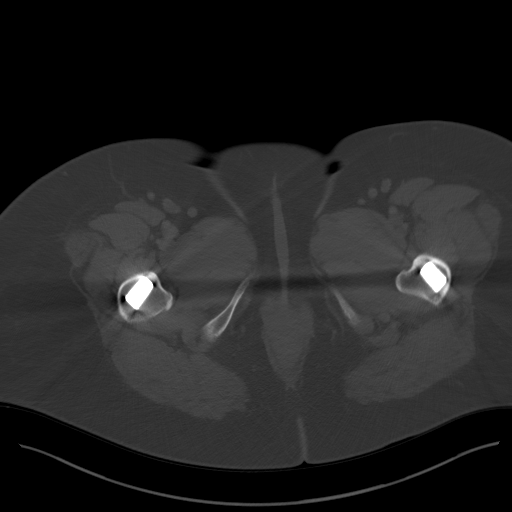
[im 11/99  soft-tissue]
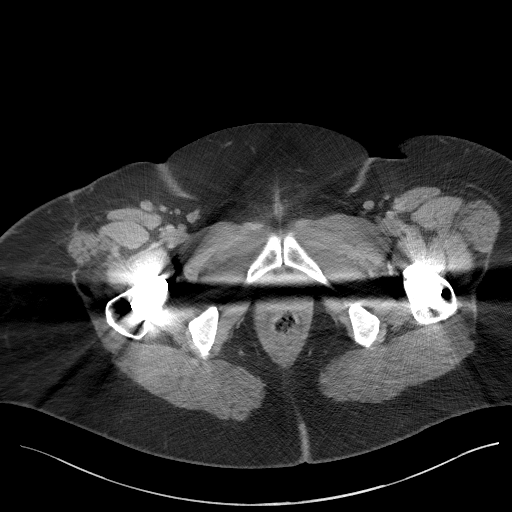
[im 22/99  soft-tissue]
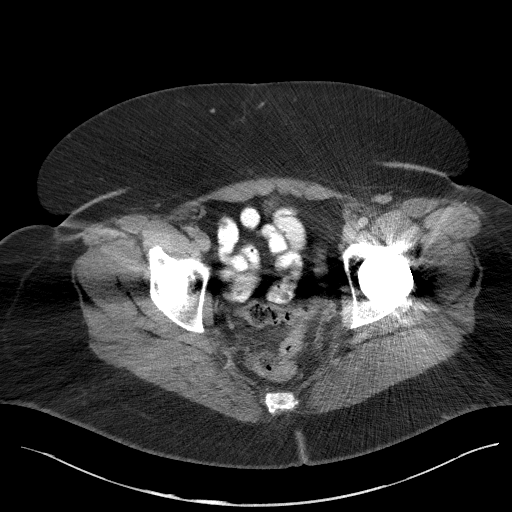
[im 28/99  soft-tissue]
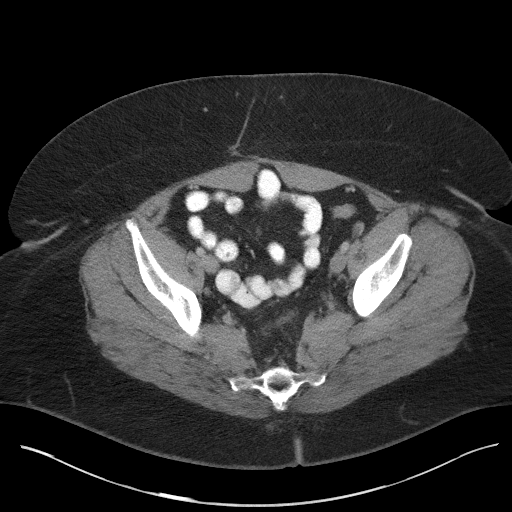
[im 33/99  soft-tissue]
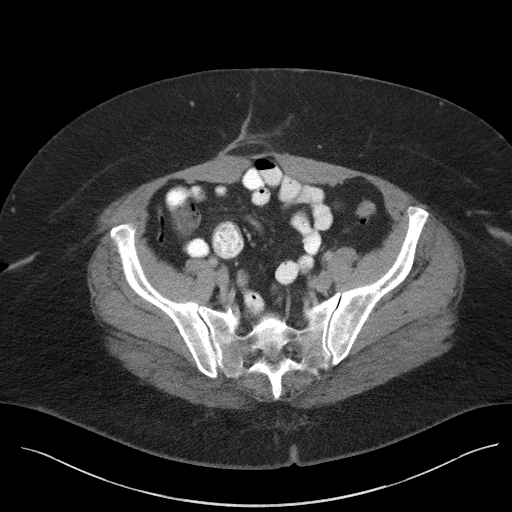
[im 44/99  soft-tissue]
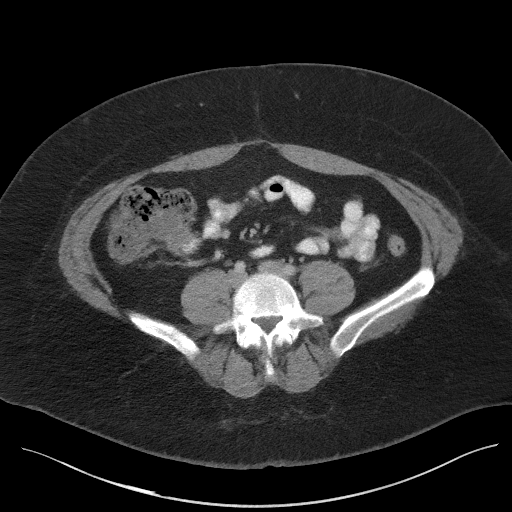
[im 50/99  soft-tissue]
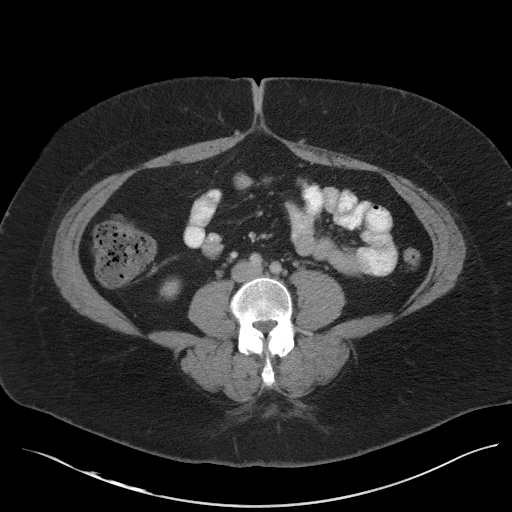
[im 55/99  soft-tissue]
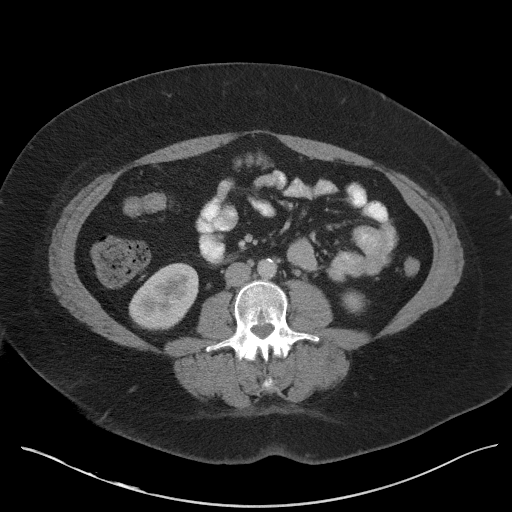
[im 66/99  soft-tissue]
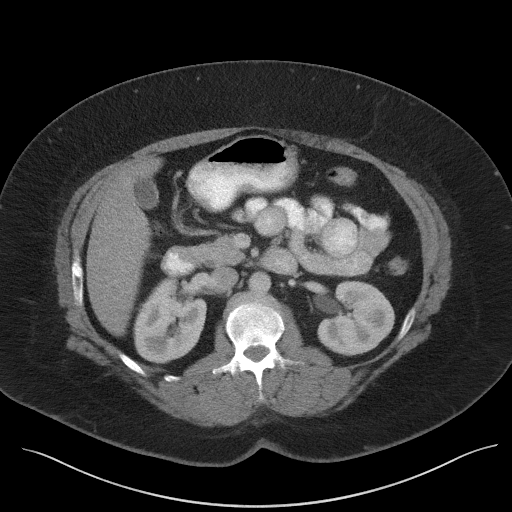
[im 66/99  bone]
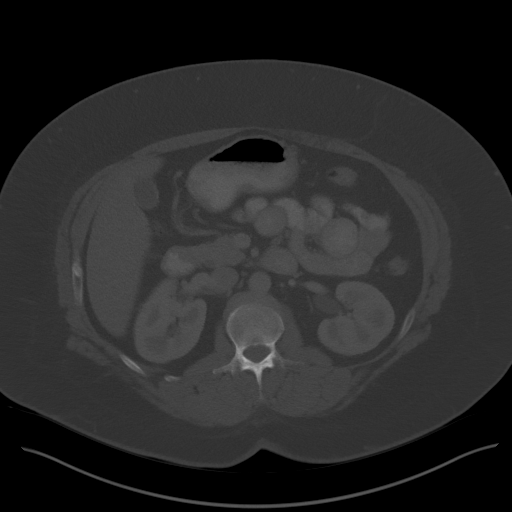
[im 71/99  soft-tissue]
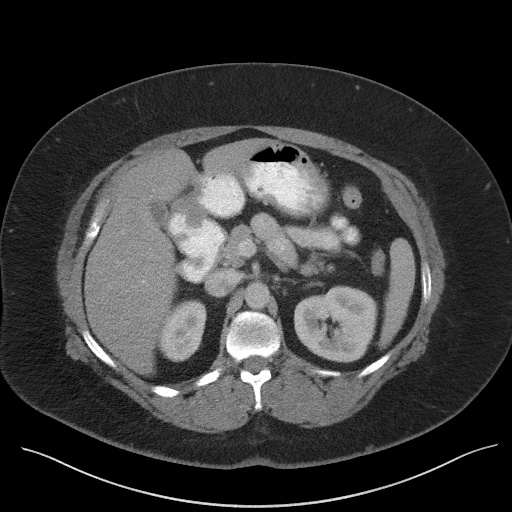
[im 77/99  soft-tissue]
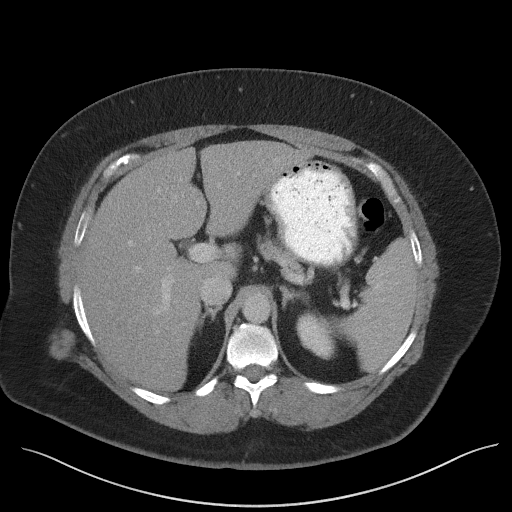
[im 77/99  lung]
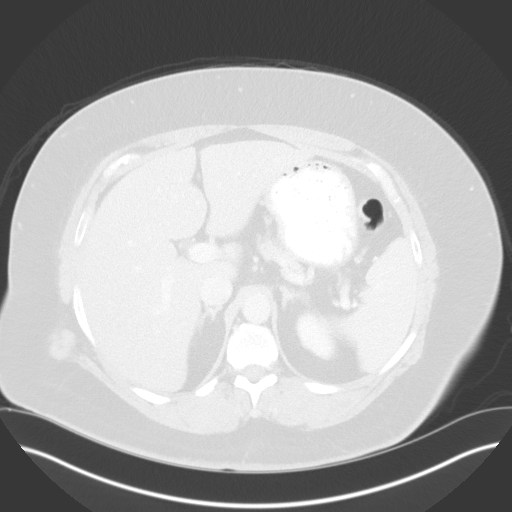
[im 82/99  lung]
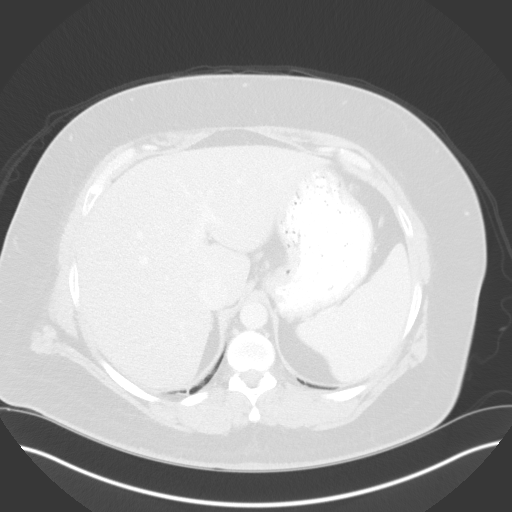
[im 88/99  soft-tissue]
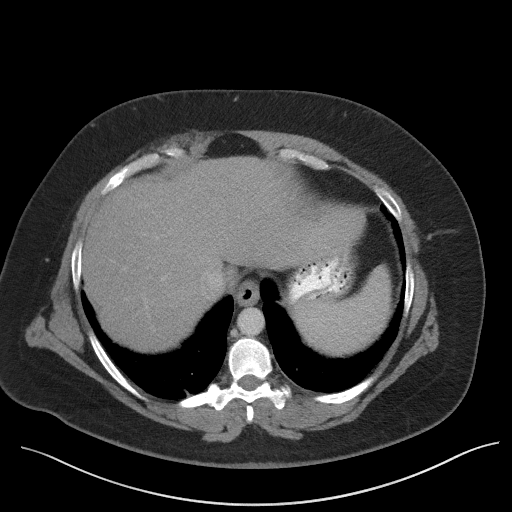
[im 88/99  lung]
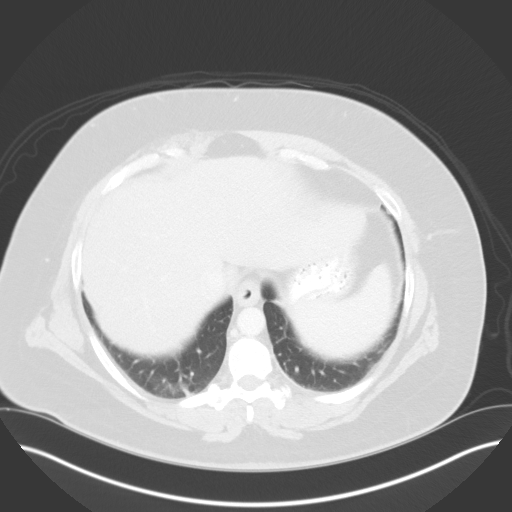
[im 93/99  soft-tissue]
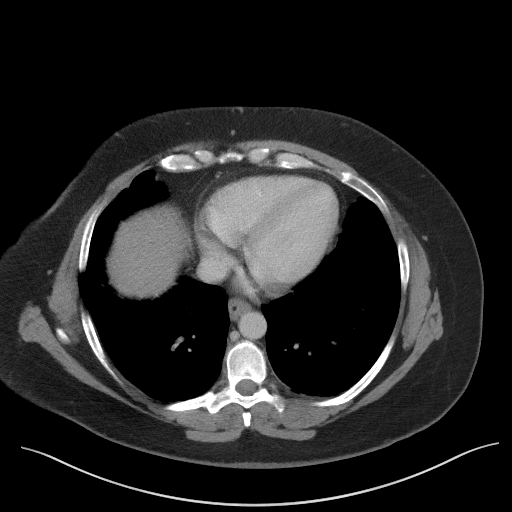
[im 93/99  lung]
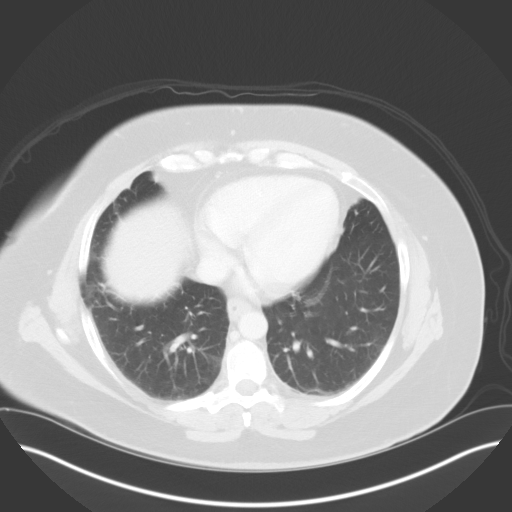

[14 of 32 positions shown; findings below may reference images not displayed]

FINDINGS: Lower chest: No acute abnormality.

Hepatobiliary: No focal liver abnormality is seen. No gallstones,
gallbladder wall thickening, or biliary dilatation.

Pancreas: Unremarkable. No pancreatic ductal dilatation or
surrounding inflammatory changes.

Spleen: Normal in size without focal abnormality.

Adrenals/Urinary Tract: Adrenal glands are unremarkable. Kidneys are
normal, without renal calculi, focal lesion, or hydronephrosis.
Bladder is unremarkable.

Stomach/Bowel: Stomach is within normal limits. Appendix appears
normal. Mild diverticular changes noted without evidence of
diverticulitis. No evidence of bowel wall thickening, distention, or
inflammatory changes.

Vascular/Lymphatic: Aortic atherosclerosis. No enlarged abdominal or
pelvic lymph nodes.

Reproductive: Status post hysterectomy. No adnexal masses.

Other: A small fat containing infraumbilical hernia is seen. No
bowel is noted within.

Musculoskeletal: Bilateral hip replacements are noted with
considerable scatter artifact in the pelvis. Degenerative change of
the lumbar spine is seen.
IMPRESSION: Small fat containing infraumbilical hernia. No other focal
abnormality is noted.

## 2017-07-27 DIAGNOSIS — J449 Chronic obstructive pulmonary disease, unspecified: Secondary | ICD-10-CM | POA: Diagnosis not present

## 2017-07-27 DIAGNOSIS — G4733 Obstructive sleep apnea (adult) (pediatric): Secondary | ICD-10-CM | POA: Diagnosis not present

## 2017-08-03 ENCOUNTER — Ambulatory Visit: Payer: Self-pay | Admitting: Surgery

## 2017-08-04 ENCOUNTER — Telehealth: Payer: Self-pay | Admitting: *Deleted

## 2017-08-04 NOTE — Telephone Encounter (Signed)
Spoke with Pharmacy and they state patient got last script filled on 07-07-17.  Refill date 08-06-17 is on a Sunday and pharmacy is closed on Sunday.  OK to fill on Saturday 08-05-17.

## 2017-08-14 DIAGNOSIS — Z79899 Other long term (current) drug therapy: Secondary | ICD-10-CM | POA: Diagnosis not present

## 2017-08-21 DIAGNOSIS — M25551 Pain in right hip: Secondary | ICD-10-CM | POA: Diagnosis not present

## 2017-08-21 DIAGNOSIS — G8929 Other chronic pain: Secondary | ICD-10-CM | POA: Diagnosis not present

## 2017-08-21 DIAGNOSIS — Z96641 Presence of right artificial hip joint: Secondary | ICD-10-CM | POA: Diagnosis not present

## 2017-08-21 DIAGNOSIS — M7061 Trochanteric bursitis, right hip: Secondary | ICD-10-CM | POA: Diagnosis not present

## 2017-08-23 ENCOUNTER — Ambulatory Visit: Payer: Self-pay | Admitting: Surgery

## 2017-08-27 DIAGNOSIS — J449 Chronic obstructive pulmonary disease, unspecified: Secondary | ICD-10-CM | POA: Diagnosis not present

## 2017-08-27 DIAGNOSIS — G4733 Obstructive sleep apnea (adult) (pediatric): Secondary | ICD-10-CM | POA: Diagnosis not present

## 2017-09-04 ENCOUNTER — Other Ambulatory Visit: Payer: Self-pay | Admitting: Orthopedic Surgery

## 2017-09-04 DIAGNOSIS — Z96641 Presence of right artificial hip joint: Principal | ICD-10-CM

## 2017-09-04 DIAGNOSIS — G8929 Other chronic pain: Secondary | ICD-10-CM

## 2017-09-04 DIAGNOSIS — M25551 Pain in right hip: Secondary | ICD-10-CM | POA: Diagnosis not present

## 2017-09-05 ENCOUNTER — Encounter: Payer: Self-pay | Admitting: Anesthesiology

## 2017-09-05 ENCOUNTER — Ambulatory Visit: Payer: Medicare Other | Attending: Anesthesiology | Admitting: Anesthesiology

## 2017-09-05 VITALS — BP 141/95 | HR 75 | Temp 98.4°F | Resp 18 | Ht 65.0 in | Wt 260.0 lb

## 2017-09-05 DIAGNOSIS — Z90710 Acquired absence of both cervix and uterus: Secondary | ICD-10-CM | POA: Insufficient documentation

## 2017-09-05 DIAGNOSIS — G894 Chronic pain syndrome: Secondary | ICD-10-CM | POA: Diagnosis not present

## 2017-09-05 DIAGNOSIS — F411 Generalized anxiety disorder: Secondary | ICD-10-CM

## 2017-09-05 DIAGNOSIS — M150 Primary generalized (osteo)arthritis: Secondary | ICD-10-CM

## 2017-09-05 DIAGNOSIS — M47812 Spondylosis without myelopathy or radiculopathy, cervical region: Secondary | ICD-10-CM

## 2017-09-05 DIAGNOSIS — M5136 Other intervertebral disc degeneration, lumbar region: Secondary | ICD-10-CM | POA: Insufficient documentation

## 2017-09-05 DIAGNOSIS — M545 Low back pain, unspecified: Secondary | ICD-10-CM

## 2017-09-05 DIAGNOSIS — Z9889 Other specified postprocedural states: Secondary | ICD-10-CM | POA: Diagnosis not present

## 2017-09-05 DIAGNOSIS — Z79899 Other long term (current) drug therapy: Secondary | ICD-10-CM | POA: Diagnosis not present

## 2017-09-05 DIAGNOSIS — M25562 Pain in left knee: Secondary | ICD-10-CM | POA: Diagnosis not present

## 2017-09-05 DIAGNOSIS — M47816 Spondylosis without myelopathy or radiculopathy, lumbar region: Secondary | ICD-10-CM | POA: Diagnosis not present

## 2017-09-05 DIAGNOSIS — G8918 Other acute postprocedural pain: Secondary | ICD-10-CM

## 2017-09-05 DIAGNOSIS — Z842 Family history of other diseases of the genitourinary system: Secondary | ICD-10-CM | POA: Diagnosis not present

## 2017-09-05 DIAGNOSIS — M51369 Other intervertebral disc degeneration, lumbar region without mention of lumbar back pain or lower extremity pain: Secondary | ICD-10-CM

## 2017-09-05 DIAGNOSIS — M8949 Other hypertrophic osteoarthropathy, multiple sites: Secondary | ICD-10-CM

## 2017-09-05 DIAGNOSIS — M25552 Pain in left hip: Secondary | ICD-10-CM | POA: Insufficient documentation

## 2017-09-05 DIAGNOSIS — M79641 Pain in right hand: Secondary | ICD-10-CM | POA: Insufficient documentation

## 2017-09-05 DIAGNOSIS — Z79891 Long term (current) use of opiate analgesic: Secondary | ICD-10-CM | POA: Diagnosis not present

## 2017-09-05 DIAGNOSIS — M1991 Primary osteoarthritis, unspecified site: Secondary | ICD-10-CM | POA: Insufficient documentation

## 2017-09-05 DIAGNOSIS — F1190 Opioid use, unspecified, uncomplicated: Secondary | ICD-10-CM | POA: Diagnosis not present

## 2017-09-05 DIAGNOSIS — M25551 Pain in right hip: Secondary | ICD-10-CM | POA: Diagnosis not present

## 2017-09-05 DIAGNOSIS — M159 Polyosteoarthritis, unspecified: Secondary | ICD-10-CM

## 2017-09-05 MED ORDER — OXYCODONE HCL 10 MG PO TABS: 10.0000 mg | ORAL_TABLET | ORAL | 0 refills | Status: DC

## 2017-09-05 NOTE — Progress Notes (Signed)
Subjective:  Patient ID: Wendy Johnson, female    DOB: 09-03-1965  Age: 52 y.o. MRN: [id number]  CC: Joint Pain   Procedure: None  HPI [facility] presents for reevaluation.  She was last seen 2 months ago and continues to have bilateral hip pain right greater than left and low back pain with hand pain and knee pain.  Unfortunately she has had a hernia that has also been giving her problems and Dr. Dahlia Byes is contemplating a hernia repair with mesh.  She continues to do her gardening and exercises as tolerated but despite this is having recalcitrant and severe pain of the same quality characteristic distribution as previously documented.  She is taking her medications and they continue to give her reasonable relief.  I reviewed her narcotic assessment sheet and based on this she states that she is continuing to derive good functional lifestyle improvement with her medications.  Otherwise she is in her usual state of health at this point.  No change in bowel bladder function her upper or lower extremity strength is noted.  Outpatient Medications Prior to Visit  Medication Sig Dispense Refill  . amphetamine-dextroamphetamine (ADDERALL) 30 MG tablet Take 30 mg by mouth 2 (two) times daily.    . busPIRone (BUSPAR) 30 MG tablet Take 30 mg by mouth 2 (two) times daily.     Marland Kitchen dexlansoprazole (DEXILANT) 60 MG capsule Take 1 capsule (60 mg total) by mouth daily. 30 capsule 5  . hydrOXYzine (ATARAX/VISTARIL) 25 MG tablet Take 25 mg by mouth every 6 (six) hours as needed for anxiety or itching (hives).    . naloxone (NARCAN) nasal spray 4 mg/0.1 mL For respiratory suppression from opioids 1 kit 2  . rosuvastatin (CRESTOR) 20 MG tablet Take 1 tablet (20 mg total) by mouth daily. 90 tablet 1  . sertraline (ZOLOFT) 100 MG tablet Take 100 mg by mouth 2 (two) times daily.    Marland Kitchen tiotropium (SPIRIVA HANDIHALER) 18 MCG inhalation capsule Place 1 capsule (18 mcg total) into inhaler and inhale daily. 30 capsule 5   . fluticasone (FLONASE) 50 MCG/ACT nasal spray Place 2 sprays into both nostrils daily. 16 g 0  . ibuprofen (ADVIL,MOTRIN) 800 MG tablet Take 1 tablet (800 mg total) by mouth every 8 (eight) hours as needed. 90 tablet 0  . Oxycodone HCl 10 MG TABS Take 1 tablet (10 mg total) by mouth every 4 (four) hours while awake. 120 tablet 0  . Lorcaserin HCl ER (BELVIQ XR) 20 MG TB24 Take 1 tablet by mouth daily. (Patient not taking: Reported on 09/05/2017) 30 tablet 2  . lubiprostone (AMITIZA) 8 MCG capsule Take 1 capsule (8 mcg total) by mouth 2 (two) times daily with a meal. (Patient not taking: Reported on 09/05/2017) 60 capsule 0  . methocarbamol (ROBAXIN) 500 MG tablet Take 1 tablet (500 mg total) by mouth 4 (four) times daily. (Patient not taking: Reported on 07/19/2017) 120 tablet 3  . predniSONE (DELTASONE) 20 MG tablet Take 1 tablet (20 mg total) by mouth daily with breakfast. (Patient not taking: Reported on 09/05/2017) 10 tablet 0  . REXULTI 1 MG TABS Take 1 tablet by mouth daily.    . traZODone (DESYREL) 150 MG tablet Take 150 mg by mouth 2 (two) times daily.    Marland Kitchen alprazolam (XANAX) 2 MG tablet Take 1 tablet by mouth 2 (two) times daily.     No facility-administered medications prior to visit.     Review of Systems CNS: No  confusion or sedation Cardiac: No angina or palpitations GI: No abdominal pain or constipation Constitutional: No nausea vomiting fevers or chills  Objective:  BP (!) 141/95   Pulse 75   Temp 98.4 F (36.9 C) (Oral)   Resp 18   Ht 5' 5"  (1.651 m)   Wt 260 lb (117.9 kg)   LMP 09/26/2014 Comment: Total  SpO2 98%   BMI 43.27 kg/m    BP Readings from Last 3 Encounters:  09/05/17 (!) 141/95  07/19/17 130/70  07/07/17 134/82     Wt Readings from Last 3 Encounters:  09/05/17 260 lb (117.9 kg)  07/19/17 273 lb 12.8 oz (124.2 kg)  07/07/17 270 lb 4.8 oz (122.6 kg)     Physical Exam Pt is alert and oriented PERRL EOMI HEART IS RRR no murmur or rub LCTA no  wheezing or rales MUSCULOSKELETAL reveals good muscle tone and bulk to the lower extremities and she is walking with a mildly antalgic gait.  Unfortunately she has gained some weight it appears   Labs  Lab Results  Component Value Date   HGBA1C 5.9 (H) 07/19/2017   HGBA1C 5.4 04/21/2017   HGBA1C 5.2 01/23/2017   Lab Results  Component Value Date   MICROALBUR 20 07/28/2016   [place]  07/19/2017     Comment:     . LDL cholesterol not calculated. Triglyceride levels greater than 400 mg/dL invalidate calculated LDL results. . Reference range: <100 . Desirable range <100 mg/dL for primary prevention;   <70 mg/dL for patients with CHD or diabetic patients  with > or = 2 CHD risk factors. Marland Kitchen LDL-C is now calculated using the Martin-Hopkins  calculation, which is a validated novel method providing  better accuracy than the Friedewald equation in the  estimation of LDL-C.  Cresenciano Genre et al. Annamaria Helling. 8466;599(35): 2061-2068  (http://education.[facility].com/faq/FAQ164)    CREATININE 0.64 07/19/2017    -------------------------------------------------------------------------------------------------------------------- Lab Results  Component Value Date   WBC 7.3 07/19/2017   HGB 14.1 07/19/2017   HCT 43.0 07/19/2017   PLT 276 07/19/2017   GLUCOSE 228 (H) 07/19/2017   CHOL 157 07/19/2017   TRIG 405 (H) 07/19/2017   HDL 30 (L) 07/19/2017   LDLCALC  07/19/2017     Comment:     . LDL cholesterol not calculated. Triglyceride levels greater than 400 mg/dL invalidate calculated LDL results. . Reference range: <100 . Desirable range <100 mg/dL for primary prevention;   <70 mg/dL for patients with CHD or diabetic patients  with > or = 2 CHD risk factors. Marland Kitchen LDL-C is now calculated using the Martin-Hopkins  calculation, which is a validated novel method providing  better accuracy than the Friedewald equation in the  estimation of LDL-C.  Cresenciano Genre et al. Annamaria Helling. 7017;793(90):  2061-2068  (http://education.[facility].com/faq/FAQ164)    ALT 21 07/19/2017   AST 15 07/19/2017   NA 137 07/19/2017   K 4.0 07/19/2017   CL 105 07/19/2017   CREATININE 0.64 07/19/2017   BUN 14 07/19/2017   CO2 23 07/19/2017   TSH 0.769 02/03/2016   INR 0.97 10/04/2016   HGBA1C 5.9 (H) 07/19/2017   MICROALBUR 20 07/28/2016    --------------------------------------------------------------------------------------------------------------------- No results found.   Assessment & Plan:   Wendy Johnson was seen today for joint pain.  Diagnoses and all orders for this visit:  Primary osteoarthritis involving multiple joints  Low back pain at multiple sites  Chronic, continuous use of opioids  Facet arthritis of lumbar region  Generalized anxiety disorder  Chronic  pain syndrome  DDD (degenerative disc disease), lumbar  Facet arthritis of cervical region  Pain of right hip joint  Post-op pain -     Discontinue: Oxycodone HCl 10 MG TABS; Take 1 tablet (10 mg total) by mouth every 4 (four) hours while awake. -     Oxycodone HCl 10 MG TABS; Take 1 tablet (10 mg total) by mouth every 4 (four) hours while awake.  FH: TAH-BSO (total abdominal hysterectomy and bilateral salpingo-oophorectomy) -     Discontinue: Oxycodone HCl 10 MG TABS; Take 1 tablet (10 mg total) by mouth every 4 (four) hours while awake. -     Oxycodone HCl 10 MG TABS; Take 1 tablet (10 mg total) by mouth every 4 (four) hours while awake.        ----------------------------------------------------------------------------------------------------------------------  Problem List Items Addressed This Visit      Unprioritized   DDD (degenerative disc disease), lumbar   Relevant Medications   Oxycodone HCl 10 MG TABS   Facet arthritis of lumbar region   Relevant Medications   Oxycodone HCl 10 MG TABS   Generalized anxiety disorder   Primary osteoarthritis involving multiple joints - Primary   Relevant  Medications   Oxycodone HCl 10 MG TABS    Other Visit Diagnoses    Low back pain at multiple sites       Relevant Medications   Oxycodone HCl 10 MG TABS   Chronic, continuous use of opioids       Chronic pain syndrome       Facet arthritis of cervical region       Relevant Medications   Oxycodone HCl 10 MG TABS   Pain of right hip joint       Post-op pain       Relevant Medications   Oxycodone HCl 10 MG TABS   FH: TAH-BSO (total abdominal hysterectomy and bilateral salpingo-oophorectomy)       Relevant Medications   Oxycodone HCl 10 MG TABS        ----------------------------------------------------------------------------------------------------------------------  1. Primary osteoarthritis involving multiple joints We will keep her on her current regimen.  She is been compliant with this and denies any diverting or illicit use.  She is due for refills on 430 and 530 we have also reviewed the [facility] practitioner database information and it is appropriate.  She is scheduled for return in 2 months.  She is to continue follow-up with with her general surgeon and primary care physicians.  2. Low back pain at multiple sites As above  3. Chronic, continuous use of opioids As above  4. Facet arthritis of lumbar region   5. Generalized anxiety disorder   6. Chronic pain syndrome   7. DDD (degenerative disc disease), lumbar Continue core stretching strengthening exercises  8. Facet arthritis of cervical region   9. Pain of right hip joint   10. Post-op pain  - Oxycodone HCl 10 MG TABS; Take 1 tablet (10 mg total) by mouth every 4 (four) hours while awake.  Dispense: 120 tablet; Refill: 0  11. FH: TAH-BSO (total abdominal hysterectomy and bilateral salpingo-oophorectomy)  - Oxycodone HCl 10 MG TABS; Take 1 tablet (10 mg total) by mouth every 4 (four) hours while awake.  Dispense: 120 tablet; Refill:  0    ----------------------------------------------------------------------------------------------------------------------  I have discontinued Nobie Putnam Throckmorton "[name]"'s alprazolam, fluticasone, and ibuprofen. I am also having her maintain her amphetamine-dextroamphetamine, busPIRone, sertraline, hydrOXYzine, naloxone, methocarbamol, REXULTI, traZODone, dexlansoprazole, tiotropium, lubiprostone, predniSONE, Lorcaserin HCl ER, rosuvastatin,  and Oxycodone HCl.   Meds ordered this encounter  Medications  . DISCONTD: Oxycodone HCl 10 MG TABS    Sig: Take 1 tablet (10 mg total) by mouth every 4 (four) hours while awake.    Dispense:  120 tablet    Refill:  0    Do not fill until [id number]  . Oxycodone HCl 10 MG TABS    Sig: Take 1 tablet (10 mg total) by mouth every 4 (four) hours while awake.    Dispense:  120 tablet    Refill:  0    Do not fill until [id number]   Patient's Medications  New Prescriptions   No medications on file  Previous Medications   AMPHETAMINE-DEXTROAMPHETAMINE (ADDERALL) 30 MG TABLET    Take 30 mg by mouth 2 (two) times daily.   BUSPIRONE (BUSPAR) 30 MG TABLET    Take 30 mg by mouth 2 (two) times daily.    DEXLANSOPRAZOLE (DEXILANT) 60 MG CAPSULE    Take 1 capsule (60 mg total) by mouth daily.   HYDROXYZINE (ATARAX/VISTARIL) 25 MG TABLET    Take 25 mg by mouth every 6 (six) hours as needed for anxiety or itching (hives).   LORCASERIN HCL ER (BELVIQ XR) 20 MG TB24    Take 1 tablet by mouth daily.   LUBIPROSTONE (AMITIZA) 8 MCG CAPSULE    Take 1 capsule (8 mcg total) by mouth 2 (two) times daily with a meal.   METHOCARBAMOL (ROBAXIN) 500 MG TABLET    Take 1 tablet (500 mg total) by mouth 4 (four) times daily.   NALOXONE (NARCAN) NASAL SPRAY 4 MG/0.1 ML    For respiratory suppression from opioids   PREDNISONE (DELTASONE) 20 MG TABLET    Take 1 tablet (20 mg total) by mouth daily with breakfast.   REXULTI 1 MG TABS    Take 1 tablet by mouth daily.   ROSUVASTATIN  (CRESTOR) 20 MG TABLET    Take 1 tablet (20 mg total) by mouth daily.   SERTRALINE (ZOLOFT) 100 MG TABLET    Take 100 mg by mouth 2 (two) times daily.   TIOTROPIUM (SPIRIVA HANDIHALER) 18 MCG INHALATION CAPSULE    Place 1 capsule (18 mcg total) into inhaler and inhale daily.   TRAZODONE (DESYREL) 150 MG TABLET    Take 150 mg by mouth 2 (two) times daily.  Modified Medications   Modified Medication Previous Medication   OXYCODONE HCL 10 MG TABS Oxycodone HCl 10 MG TABS      Take 1 tablet (10 mg total) by mouth every 4 (four) hours while awake.    Take 1 tablet (10 mg total) by mouth every 4 (four) hours while awake.  Discontinued Medications   ALPRAZOLAM (XANAX) 2 MG TABLET    Take 1 tablet by mouth 2 (two) times daily.   FLUTICASONE (FLONASE) 50 MCG/ACT NASAL SPRAY    Place 2 sprays into both nostrils daily.   IBUPROFEN (ADVIL,MOTRIN) 800 MG TABLET    Take 1 tablet (800 mg total) by mouth every 8 (eight) hours as needed.   ----------------------------------------------------------------------------------------------------------------------  Follow-up: Return in about 2 months (around 11/05/2017) for evaluation, med refill.    Molli Barrows, MD

## 2017-09-05 NOTE — Progress Notes (Signed)
Nursing Pain Medication Assessment:  Safety precautions to be maintained throughout the outpatient stay will include: orient to surroundings, keep bed in low position, maintain call bell within reach at all times, provide assistance with transfer out of bed and ambulation.  Medication Inspection Compliance: Wendy Johnson did not comply with our request to bring her pills to be counted. She was reminded that bringing the medication bottles, even when empty, is a requirement.  Medication: None brought in. Pill/Patch Count: None available to be counted. Bottle Appearance: No container available. Did not bring bottle(s) to appointment. Filled Date: N/A Last Medication intake:  Ran out of medicine more than 48 hours ago

## 2017-09-05 NOTE — Patient Instructions (Signed)
You ere given 2 prescriptions for Oxycodone today.

## 2017-09-09 LAB — TOXASSURE SELECT 13 (MW), URINE

## 2017-09-26 ENCOUNTER — Telehealth: Payer: Self-pay | Admitting: Surgery

## 2017-09-26 DIAGNOSIS — J449 Chronic obstructive pulmonary disease, unspecified: Secondary | ICD-10-CM | POA: Diagnosis not present

## 2017-09-26 DIAGNOSIS — G4733 Obstructive sleep apnea (adult) (pediatric): Secondary | ICD-10-CM | POA: Diagnosis not present

## 2017-09-26 NOTE — Telephone Encounter (Signed)
Called the patient reminding her of her appointment, patient stated she still hasn't quit smoking and would need to reschedule her appointment, patient reschedule to July.

## 2017-09-27 ENCOUNTER — Encounter
Admission: RE | Admit: 2017-09-27 | Discharge: 2017-09-27 | Disposition: A | Discharge status: Home / Self Care | Payer: Medicare Other | Source: Ambulatory Visit | Attending: Orthopedic Surgery | Admitting: Orthopedic Surgery

## 2017-09-27 ENCOUNTER — Encounter: Admission: RE | Admit: 2017-09-27 | Payer: Medicare Other | Source: Ambulatory Visit

## 2017-09-27 ENCOUNTER — Ambulatory Visit: Payer: Self-pay | Admitting: Surgery

## 2017-09-27 DIAGNOSIS — G8929 Other chronic pain: Secondary | ICD-10-CM | POA: Diagnosis not present

## 2017-09-27 DIAGNOSIS — M25551 Pain in right hip: Secondary | ICD-10-CM | POA: Diagnosis not present

## 2017-09-27 DIAGNOSIS — Z96641 Presence of right artificial hip joint: Secondary | ICD-10-CM | POA: Diagnosis not present

## 2017-09-27 DIAGNOSIS — Z471 Aftercare following joint replacement surgery: Secondary | ICD-10-CM | POA: Diagnosis not present

## 2017-09-27 DIAGNOSIS — Z96643 Presence of artificial hip joint, bilateral: Secondary | ICD-10-CM | POA: Diagnosis not present

## 2017-09-27 MED ORDER — TECHNETIUM TC 99M MEDRONATE IV KIT
23.0090 | PACK | Freq: Once | INTRAVENOUS | Status: AC | PRN
  Administered 2017-09-27: 23.009 via INTRAVENOUS

## 2017-10-06 ENCOUNTER — Other Ambulatory Visit: Payer: Self-pay

## 2017-10-06 ENCOUNTER — Encounter
Admission: RE | Admit: 2017-10-06 | Discharge: 2017-10-06 | Disposition: A | Discharge status: Home / Self Care | Payer: Medicare Other | Source: Ambulatory Visit | Attending: Orthopedic Surgery | Admitting: Orthopedic Surgery

## 2017-10-06 DIAGNOSIS — Z01810 Encounter for preprocedural cardiovascular examination: Secondary | ICD-10-CM | POA: Diagnosis not present

## 2017-10-06 NOTE — Patient Instructions (Signed)
Your procedure is scheduled on: Tuesday, October 10, 2017 Report to Day Surgery on the 2nd floor of the [facility]. To find out your arrival time, please call [phone] between 1PM - 3PM on: Monday, October 09, 2017  REMEMBER: Instructions that are not followed completely may result in serious medical risk, up to and including death; or upon the discretion of your surgeon and anesthesiologist your surgery may need to be rescheduled.  Do not eat food after midnight the night before your procedure.  No gum chewing, lozengers or hard candies.  You may however, drink CLEAR liquids up to 2 hours before you are scheduled to arrive for your surgery. Do not drink anything within 2 hours of the start of your surgery.  Clear liquids include: - water  - apple juice without pulp - clear gatorade - black coffee or tea (Do NOT add anything to the coffee or tea) Do NOT drink anything that is not on this list.  No Alcohol for 24 hours before or after surgery.  No Smoking including e-cigarettes for 24 hours prior to surgery.  No chewable tobacco products for at least 6 hours prior to surgery.  No nicotine patches on the day of surgery.  On the morning of surgery brush your teeth with toothpaste and water, you may rinse your mouth with mouthwash if you wish. Do not swallow any toothpaste or mouthwash.  Notify your doctor if there is any change in your medical condition (cold, fever, infection).  Do not wear jewelry, make-up, hairpins, clips or nail polish.  Do not wear lotions, powders, or perfumes. You may wear deodorant.  Do not shave 48 hours prior to surgery.   Contacts and dentures may not be worn into surgery.  Do not bring valuables to the hospital, including drivers license, insurance or credit cards.  [facility] is not responsible for any belongings or valuables.   TAKE THESE MEDICATIONS THE MORNING OF SURGERY:  1.  BUSPIRONE 2.  DEXILANT (take one the night before surgery and one  the morning of surgery - helps to prevent nausea after surgery) 3.  OXYCODONE (if needed for pain) 4.  SERTRALINE 5.  SPIRIVA INHALER  Use CHG Soap as directed on instruction sheet.  Bring your C-PAP to the hospital with you in case you may have to spend the night.   NOW!  Stop Anti-inflammatories (NSAIDS) such as Advil, Aleve, Ibuprofen, Motrin, Naproxen, Naprosyn and Aspirin based products such as Excedrin, Goodys Powder, BC Powder. (May take Tylenol or Acetaminophen if needed.)  NOW!  Stop ANY OVER THE COUNTER supplements until after surgery.  Wear comfortable clothing (specific to your surgery type) to the hospital.  Plan for stool softeners for home use.  If you are being discharged the day of surgery, you will not be allowed to drive home. You will need a responsible adult to drive you home and stay with you that night.   If you are taking public transportation, you will need to have a responsible adult with you. Please confirm with your physician that it is acceptable to use public transportation.   Please call [phone] if you have any questions about these instructions.

## 2017-10-10 ENCOUNTER — Encounter
Admission: RE | Disposition: A | Discharge status: Home / Self Care | Payer: Self-pay | Source: Ambulatory Visit | Attending: Orthopedic Surgery

## 2017-10-10 ENCOUNTER — Encounter: Payer: Self-pay | Admitting: *Deleted

## 2017-10-10 ENCOUNTER — Other Ambulatory Visit: Payer: Self-pay

## 2017-10-10 ENCOUNTER — Ambulatory Visit
Admission: RE | Admit: 2017-10-10 | Discharge: 2017-10-10 | Disposition: A | Discharge status: Home / Self Care | Payer: Medicare Other | Source: Ambulatory Visit | Attending: Orthopedic Surgery | Admitting: Orthopedic Surgery

## 2017-10-10 ENCOUNTER — Ambulatory Visit: Payer: Medicare Other | Admitting: Anesthesiology

## 2017-10-10 DIAGNOSIS — M654 Radial styloid tenosynovitis [de Quervain]: Secondary | ICD-10-CM | POA: Insufficient documentation

## 2017-10-10 DIAGNOSIS — Z79899 Other long term (current) drug therapy: Secondary | ICD-10-CM | POA: Insufficient documentation

## 2017-10-10 DIAGNOSIS — I10 Essential (primary) hypertension: Secondary | ICD-10-CM | POA: Diagnosis not present

## 2017-10-10 DIAGNOSIS — E785 Hyperlipidemia, unspecified: Secondary | ICD-10-CM | POA: Diagnosis not present

## 2017-10-10 DIAGNOSIS — J449 Chronic obstructive pulmonary disease, unspecified: Secondary | ICD-10-CM | POA: Insufficient documentation

## 2017-10-10 DIAGNOSIS — Z6841 Body Mass Index (BMI) 40.0 and over, adult: Secondary | ICD-10-CM | POA: Insufficient documentation

## 2017-10-10 DIAGNOSIS — F909 Attention-deficit hyperactivity disorder, unspecified type: Secondary | ICD-10-CM | POA: Diagnosis not present

## 2017-10-10 DIAGNOSIS — G5601 Carpal tunnel syndrome, right upper limb: Secondary | ICD-10-CM | POA: Diagnosis not present

## 2017-10-10 DIAGNOSIS — Z8249 Family history of ischemic heart disease and other diseases of the circulatory system: Secondary | ICD-10-CM | POA: Insufficient documentation

## 2017-10-10 DIAGNOSIS — E1151 Type 2 diabetes mellitus with diabetic peripheral angiopathy without gangrene: Secondary | ICD-10-CM | POA: Insufficient documentation

## 2017-10-10 DIAGNOSIS — Z96642 Presence of left artificial hip joint: Secondary | ICD-10-CM | POA: Diagnosis not present

## 2017-10-10 DIAGNOSIS — Z79891 Long term (current) use of opiate analgesic: Secondary | ICD-10-CM | POA: Insufficient documentation

## 2017-10-10 DIAGNOSIS — G5603 Carpal tunnel syndrome, bilateral upper limbs: Secondary | ICD-10-CM | POA: Diagnosis not present

## 2017-10-10 DIAGNOSIS — Z96651 Presence of right artificial knee joint: Secondary | ICD-10-CM | POA: Insufficient documentation

## 2017-10-10 DIAGNOSIS — E7800 Pure hypercholesterolemia, unspecified: Secondary | ICD-10-CM | POA: Insufficient documentation

## 2017-10-10 DIAGNOSIS — E119 Type 2 diabetes mellitus without complications: Secondary | ICD-10-CM | POA: Diagnosis not present

## 2017-10-10 DIAGNOSIS — K219 Gastro-esophageal reflux disease without esophagitis: Secondary | ICD-10-CM | POA: Diagnosis not present

## 2017-10-10 DIAGNOSIS — G4730 Sleep apnea, unspecified: Secondary | ICD-10-CM | POA: Diagnosis not present

## 2017-10-10 DIAGNOSIS — F419 Anxiety disorder, unspecified: Secondary | ICD-10-CM | POA: Insufficient documentation

## 2017-10-10 DIAGNOSIS — F319 Bipolar disorder, unspecified: Secondary | ICD-10-CM | POA: Insufficient documentation

## 2017-10-10 DIAGNOSIS — M1990 Unspecified osteoarthritis, unspecified site: Secondary | ICD-10-CM | POA: Insufficient documentation

## 2017-10-10 HISTORY — PX: CARPAL TUNNEL RELEASE: SHX101

## 2017-10-10 LAB — URINE DRUG SCREEN, QUALITATIVE (ARMC ONLY)
Amphetamines, Ur Screen: POSITIVE — AB
BARBITURATES, UR SCREEN: NOT DETECTED
BENZODIAZEPINE, UR SCRN: NOT DETECTED
CANNABINOID 50 NG, UR ~~LOC~~: NOT DETECTED
Cocaine Metabolite,Ur ~~LOC~~: NOT DETECTED
MDMA (Ecstasy)Ur Screen: NOT DETECTED
Methadone Scn, Ur: NOT DETECTED
Opiate, Ur Screen: NOT DETECTED
PHENCYCLIDINE (PCP) UR S: NOT DETECTED
TRICYCLIC, UR SCREEN: POSITIVE — AB

## 2017-10-10 LAB — GLUCOSE, CAPILLARY: GLUCOSE-CAPILLARY: 68 mg/dL (ref 65–99)

## 2017-10-10 SURGERY — CARPAL TUNNEL RELEASE
Anesthesia: General | Laterality: Right

## 2017-10-10 MED ORDER — PROPOFOL 10 MG/ML IV BOLUS
INTRAVENOUS | Status: DC | PRN
  Administered 2017-10-10: 200 mg via INTRAVENOUS

## 2017-10-10 MED ORDER — FENTANYL CITRATE (PF) 100 MCG/2ML IJ SOLN
INTRAMUSCULAR | Status: AC
Start: 2017-10-10 — End: ?
  Filled 2017-10-10: qty 2

## 2017-10-10 MED ORDER — LIDOCAINE HCL (PF) 2 % IJ SOLN
INTRAMUSCULAR | Status: AC
  Filled 2017-10-10: qty 10

## 2017-10-10 MED ORDER — LIDOCAINE HCL (CARDIAC) PF 100 MG/5ML IV SOSY
PREFILLED_SYRINGE | INTRAVENOUS | Status: DC | PRN
  Administered 2017-10-10: 80 mg via INTRAVENOUS

## 2017-10-10 MED ORDER — ONDANSETRON HCL 4 MG/2ML IJ SOLN
INTRAMUSCULAR | Status: AC
  Filled 2017-10-10: qty 2

## 2017-10-10 MED ORDER — DEXAMETHASONE SODIUM PHOSPHATE 10 MG/ML IJ SOLN
INTRAMUSCULAR | Status: DC | PRN
  Administered 2017-10-10: 10 mg via INTRAVENOUS

## 2017-10-10 MED ORDER — MIDAZOLAM HCL 2 MG/2ML IJ SOLN
INTRAMUSCULAR | Status: AC
  Filled 2017-10-10: qty 2

## 2017-10-10 MED ORDER — OXYCODONE HCL 5 MG PO TABS: 5.0000 mg | ORAL_TABLET | ORAL | Status: DC | PRN

## 2017-10-10 MED ORDER — PROPOFOL 10 MG/ML IV BOLUS
INTRAVENOUS | Status: AC
  Filled 2017-10-10: qty 20

## 2017-10-10 MED ORDER — METOCLOPRAMIDE HCL 5 MG/ML IJ SOLN: 5.0000 mg | Freq: Three times a day (TID) | INTRAMUSCULAR | Status: DC | PRN

## 2017-10-10 MED ORDER — ONDANSETRON HCL 4 MG/2ML IJ SOLN
INTRAMUSCULAR | Status: DC | PRN
  Administered 2017-10-10: 4 mg via INTRAVENOUS

## 2017-10-10 MED ORDER — SODIUM CHLORIDE 0.9 % IV SOLN: INTRAVENOUS | Status: DC

## 2017-10-10 MED ORDER — SODIUM CHLORIDE 0.9 % IV SOLN
INTRAVENOUS | Status: DC
  Administered 2017-10-10: 13:00:00 via INTRAVENOUS

## 2017-10-10 MED ORDER — BUPIVACAINE HCL 0.5 % IJ SOLN
INTRAMUSCULAR | Status: DC | PRN
  Administered 2017-10-10: 10 mL

## 2017-10-10 MED ORDER — DEXAMETHASONE SODIUM PHOSPHATE 10 MG/ML IJ SOLN
INTRAMUSCULAR | Status: AC
  Filled 2017-10-10: qty 1

## 2017-10-10 MED ORDER — FENTANYL CITRATE (PF) 100 MCG/2ML IJ SOLN
INTRAMUSCULAR | Status: DC | PRN
  Administered 2017-10-10 (×2): 25 ug via INTRAVENOUS

## 2017-10-10 MED ORDER — OXYCODONE HCL 5 MG PO TABS: 5.0000 mg | ORAL_TABLET | Freq: Once | ORAL | Status: DC | PRN

## 2017-10-10 MED ORDER — OXYCODONE HCL 5 MG PO TABS: 5.0000 mg | ORAL_TABLET | ORAL | 0 refills | Status: DC | PRN

## 2017-10-10 MED ORDER — OXYCODONE HCL 5 MG/5ML PO SOLN: 5.0000 mg | Freq: Once | ORAL | Status: DC | PRN

## 2017-10-10 MED ORDER — KETAMINE HCL 50 MG/ML IJ SOLN
INTRAMUSCULAR | Status: DC | PRN
  Administered 2017-10-10: 50 mg via INTRAMUSCULAR

## 2017-10-10 MED ORDER — OXYCODONE-ACETAMINOPHEN 5-325 MG PO TABS: 1.0000 | ORAL_TABLET | Freq: Four times a day (QID) | ORAL | 0 refills | Status: DC | PRN

## 2017-10-10 MED ORDER — ONDANSETRON HCL 4 MG/2ML IJ SOLN: 4.0000 mg | Freq: Four times a day (QID) | INTRAMUSCULAR | Status: DC | PRN

## 2017-10-10 MED ORDER — MIDAZOLAM HCL 2 MG/2ML IJ SOLN
INTRAMUSCULAR | Status: DC | PRN
  Administered 2017-10-10: 2 mg via INTRAVENOUS

## 2017-10-10 MED ORDER — FENTANYL CITRATE (PF) 100 MCG/2ML IJ SOLN
INTRAMUSCULAR | Status: AC
  Administered 2017-10-10: 50 ug via INTRAVENOUS
  Filled 2017-10-10: qty 2

## 2017-10-10 MED ORDER — ONDANSETRON HCL 4 MG PO TABS: 4.0000 mg | ORAL_TABLET | Freq: Four times a day (QID) | ORAL | Status: DC | PRN

## 2017-10-10 MED ORDER — FENTANYL CITRATE (PF) 100 MCG/2ML IJ SOLN
25.0000 ug | INTRAMUSCULAR | Status: DC | PRN
  Administered 2017-10-10 (×2): 50 ug via INTRAVENOUS

## 2017-10-10 MED ORDER — METOCLOPRAMIDE HCL 10 MG PO TABS: 5.0000 mg | ORAL_TABLET | Freq: Three times a day (TID) | ORAL | Status: DC | PRN

## 2017-10-10 MED ORDER — KETOROLAC TROMETHAMINE 30 MG/ML IJ SOLN
INTRAMUSCULAR | Status: AC
  Filled 2017-10-10: qty 1

## 2017-10-10 SURGICAL SUPPLY — 22 items
BANDAGE ACE 3X5.8 VEL STRL LF (GAUZE/BANDAGES/DRESSINGS) ×2 IMPLANT
CANISTER SUCT 1200ML W/VALVE (MISCELLANEOUS) ×2 IMPLANT
CHLORAPREP W/TINT 26ML (MISCELLANEOUS) ×2 IMPLANT
CUFF TOURN 18 STER (MISCELLANEOUS) IMPLANT
ELECT CAUTERY NEEDLE 2.0 MIC (NEEDLE) IMPLANT
GAUZE PETRO XEROFOAM 1X8 (MISCELLANEOUS) ×2 IMPLANT
GAUZE SPONGE 4X4 12PLY STRL (GAUZE/BANDAGES/DRESSINGS) ×2 IMPLANT
GLOVE SURG SYN 9.0  PF PI (GLOVE) ×1
GLOVE SURG SYN 9.0 PF PI (GLOVE) ×1 IMPLANT
GOWN SRG 2XL LVL 4 RGLN SLV (GOWNS) ×1 IMPLANT
GOWN STRL NON-REIN 2XL LVL4 (GOWNS) ×1
GOWN STRL REUS W/ TWL LRG LVL3 (GOWN DISPOSABLE) ×1 IMPLANT
GOWN STRL REUS W/TWL LRG LVL3 (GOWN DISPOSABLE) ×1
KIT TURNOVER KIT A (KITS) ×2 IMPLANT
NS IRRIG 500ML POUR BTL (IV SOLUTION) ×2 IMPLANT
PACK EXTREMITY ARMC (MISCELLANEOUS) ×2 IMPLANT
PAD CAST CTTN 4X4 STRL (SOFTGOODS) ×1 IMPLANT
PADDING CAST COTTON 4X4 STRL (SOFTGOODS) ×1
SCALPEL PROTECTED #15 DISP (BLADE) ×4 IMPLANT
SUT ETHILON 4-0 (SUTURE) ×1
SUT ETHILON 4-0 FS2 18XMFL BLK (SUTURE) ×1
SUTURE ETHLN 4-0 FS2 18XMF BLK (SUTURE) ×1 IMPLANT

## 2017-10-10 NOTE — OR Nursing (Signed)
Incision not documented in epic, drsg to right wrist clean and dry; right arm elevated on pillows x 2.

## 2017-10-10 NOTE — Op Note (Signed)
10/10/2017  2:53 PM  PATIENT:  Wendy Johnson  52 y.o. female  PRE-OPERATIVE DIAGNOSIS:  CARPAL TUNNEL SYNDROME right wrist  POST-OPERATIVE DIAGNOSIS:  CARPAL TUNNEL SYNDROME same  PROCEDURE:  Procedure(s): CARPAL TUNNEL RELEASE (Right)  SURGEON: Laurene Footman, MD  ASSISTANTS: None  ANESTHESIA:   general  EBL:  Total I/O In: 500 [I.V.:500] Out: 2 [Blood:2]  BLOOD ADMINISTERED:none  DRAINS: none   LOCAL MEDICATIONS USED:  MARCAINE     SPECIMEN:  No Specimen  DISPOSITION OF SPECIMEN:  N/A  COUNTS:  YES  TOURNIQUET:  * Missing tourniquet times found for documented tourniquets in log: [id number] *16 minutes at 250 mmHg IMPLANTS: None  DICTATION: .Dragon Dictation patient brought the operating room and after adequate general anesthesia was obtained the right arm was prepped and draped you sterile fashion.  After patient identification and timeout procedures were completed, tourniquet was raised.  Incision in line with the ring metacarpal was performed with approximately 2 and half centimeter incision made skin and subcutaneous tissue was spread and there is quite a bit of muscle overlying the transcarpal ligament.  After this was elevated off the ligament the ligament was opened and a vascular hemostat was placed deep to protect the underlying structures.  Release was carried out distally and then proximally until there is good vascular blush to the nerve but a centimeter proximal to the wrist flexion crease was area of maximum compression after release there is good vascular blush.  The wound was then irrigated and closed with after infiltration of 10 cc half percent Sensorcaine into the area the incision findings there was some moderate flexor tenosynovitis noted no masses in the carpal tunnel wound was closed with simple interrupted 5-0 nylon skin suture followed by Xeroform 4 x 4 web roll and Ace wrap.  PLAN OF CARE: Discharge to home after PACU  PATIENT DISPOSITION:  PACU -  hemodynamically stable.

## 2017-10-10 NOTE — Anesthesia Preprocedure Evaluation (Signed)
Anesthesia Evaluation  Patient identified by MRN, date of birth, ID band Patient awake    Reviewed: Allergy & Precautions, H&P , NPO status , Patient's Chart, lab work & pertinent test results  History of Anesthesia Complications Negative for: history of anesthetic complications  Airway Mallampati: III  TM Distance: <3 FB Neck ROM: limited    Dental  (+) Chipped, Poor Dentition, Missing   Pulmonary shortness of breath and with exertion, sleep apnea, Continuous Positive Airway Pressure Ventilation and Oxygen sleep apnea , COPD,  COPD inhaler, Current Smoker,           Cardiovascular Exercise Tolerance: Good hypertension, (-) angina+ Peripheral Vascular Disease  (-) Past MI and (-) DOE      Neuro/Psych  Headaches, PSYCHIATRIC DISORDERS Anxiety Depression Bipolar Disorder  Neuromuscular disease    GI/Hepatic negative GI ROS, Neg liver ROS, GERD  Medicated and Controlled,  Endo/Other  diabetes, Type 2  Renal/GU      Musculoskeletal  (+) Arthritis ,   Abdominal   Peds  Hematology negative hematology ROS (+)   Anesthesia Other Findings Past Medical History: No date: ADHD (attention deficit hyperactivity disorder) No date: Anxiety No date: AR (allergic rhinitis) No date: Arthritis No date: Benign hypertension     Comment:  NO MEDS No date: Bipolar disorder (HCC) No date: Chronic back pain No date: Chronic constipation No date: Chronic insomnia No date: COPD (chronic obstructive pulmonary disease) (HCC) No date: Deaf     Comment:  RIGHT EAR No date: Decreased dorsalis pedis pulse No date: Depression No date: Diabetes mellitus without complication (HCC)     Comment:  PATIENT DENIES No date: Dyslipidemia No date: Fatty liver No date: GERD (gastroesophageal reflux disease) No date: Hepatomegaly No date: High cholesterol No date: Hot flashes No date: Migraine with aura No date: OCD (obsessive compulsive  disorder) No date: Plantar warts No date: Severe obesity (HCC) No date: Shortness of breath dyspnea No date: Sleep apnea     Comment:  NO CPAP No date: Tobacco use No date: Trochanteric bursitis of right hip  Past Surgical History: 10/20/2014: ABDOMINAL HYSTERECTOMY; N/A     Comment:  Procedure: Total abdominial hysterectomy, bilateral               salpingo-oophorectomy;  Surgeon: Brayton Mars,               MD;  Location: [facility];  Service: Gynecology;                Laterality: N/A; No date: BILATERAL SALPINGOOPHORECTOMY No date: bone spurs removed; Bilateral 06/08/2017: CARPAL TUNNEL RELEASE; Left     Comment:  Procedure: CARPAL TUNNEL RELEASE;  Surgeon: Hessie Knows, MD;  Location: [facility];  Service: Orthopedics;               Laterality: Left; 09/01/2016: COLONOSCOPY WITH PROPOFOL; N/A     Comment:  Procedure: COLONOSCOPY WITH PROPOFOL;  Surgeon: Jonathon Bellows, MD;  Location: [facility];  Service: Endoscopy;              Laterality: N/A; 06/08/2017: DORSAL COMPARTMENT RELEASE; Left     Comment:  Procedure: RELEASE DORSAL COMPARTMENT (DEQUERVAIN);                Surgeon: Hessie Knows, MD;  Location: [facility];  Service: Orthopedics;  Laterality: Left; 09/01/2016: ESOPHAGOGASTRODUODENOSCOPY (EGD) WITH PROPOFOL; N/A     Comment:  Procedure: ESOPHAGOGASTRODUODENOSCOPY (EGD) WITH               PROPOFOL;  Surgeon: Jonathon Bellows, MD;  Location: [facility]               [facility];  Service: Endoscopy;  Laterality: N/A; No date: FOOT SURGERY; Bilateral No date: JOINT REPLACEMENT; Right     Comment:  Total Knee replacement X 2 No date: JOINT REPLACEMENT; Bilateral     Comment:  Total Hip Replacement No date: knee arthroscopo; Right 09/22/2014: LAPAROSCOPY     Comment:  Procedure: LAPAROSCOPY OPERATIVE;  Surgeon: Brayton Mars, MD;  Location: [facility];  Service:               Gynecology;;  excision and fulgeration of  endomertriosis No date: LIPOMA EXCISION No date: TONSILLECTOMY 12/16/2014: TOTAL HIP ARTHROPLASTY; Left     Comment:  Procedure: TOTAL HIP ARTHROPLASTY ANTERIOR APPROACH;                Surgeon: Hessie Knows, MD;  Location: [facility];  Service:              Orthopedics;  Laterality: Left; 05/12/2015: TOTAL HIP ARTHROPLASTY; Right     Comment:  Procedure: TOTAL HIP ARTHROPLASTY ANTERIOR APPROACH;                Surgeon: Hessie Knows, MD;  Location: [facility];  Service:              Orthopedics;  Laterality: Right; 09/22/2015: TOTAL KNEE REVISION; Right     Comment:  Procedure: TOTAL KNEE REVISION/ REVISE POLYIETHYLENE;                Surgeon: Hessie Knows, MD;  Location: [facility];  Service:              Orthopedics;  Laterality: Right; 10/11/2016: TOTAL KNEE REVISION; Right     Comment:  Procedure: TOTAL KNEE REVISION;  Surgeon: Hessie Knows,              MD;  Location: [facility];  Service: Orthopedics;                Laterality: Right; No date: TUBAL LIGATION  BMI    Body Mass Index:  44.60 kg/m      Reproductive/Obstetrics negative OB ROS                             Anesthesia Physical Anesthesia Plan  ASA: IV  Anesthesia Plan: General   Post-op Pain Management:    Induction: Intravenous  PONV Risk Score and Plan: Ondansetron, Dexamethasone, Midazolam and Treatment may vary due to age or medical condition  Airway Management Planned: LMA  Additional Equipment:   Intra-op Plan:   Post-operative Plan: Extubation in OR  Informed Consent: I have reviewed the patients History and Physical, chart, labs and discussed the procedure including the risks, benefits and alternatives for the proposed anesthesia with the patient or authorized representative who has indicated his/her understanding and acceptance.   Dental Advisory Given  Plan Discussed with: Anesthesiologist, CRNA and Surgeon  Anesthesia Plan Comments: (Patient consented for risks of anesthesia  including but not limited to:  - adverse reactions to medications - damage to teeth, lips or other oral mucosa -  sore throat or hoarseness - Damage to heart, brain, lungs or loss of life  Patient voiced understanding.)        Anesthesia Quick Evaluation

## 2017-10-10 NOTE — Anesthesia Post-op Follow-up Note (Signed)
Anesthesia QCDR form completed.        

## 2017-10-10 NOTE — H&P (Signed)
History of the Present Illness: Wendy Johnson is a 52 y.o. female here for follow-up of bilateral carpal tunnel syndrome. The patient had EMG nerve conduction testing on 05/24/2017 with Dr. Melrose Nakayama. These showed bilateral mild carpal tunnel syndromen's. She has had two injections for the de Quervain's as well.   The patient reports that her left wrist was more symptomatic. She notes improvement after prior left carpal tunnel release and now is having right hand symptoms comes in for right carpal tunnel release  I have reviewed past medical, surgical, social and family history, and allergies as documented in the EMR.  Past Medical History: Past Medical History:  Diagnosis Date  . Arthritis  . Chickenpox  . COPD (chronic obstructive pulmonary disease) , unspecified (CMS-HCC)  . Depression, unspecified  . Diabetes mellitus, type 2 (CMS-HCC)  . Hemorrhoids  . Hives  . Hyperlipidemia, unspecified  . Hypertension  . Menstrual abnormality, unspecified  . Shingles  . Sleep apnea   Past Surgical History: Past Surgical History:  Procedure Laterality Date  . Cervical cancer cells removed 1996  . COLONOSCOPY  . EGD 04/06/2010  No need to repeat per Dr. Gustavo Lah.  . Fatty tumor removed 08/2008  from abdomen  . HYSTERECTOMY  . JOINT REPLACEMENT Right 02/10/2009  total knee  . KNEE ARTHROSCOPY Right 10/14/2008  partial medial and lateral meniscectomies, chondroplasty right medial femoral condyle, lateral release  . LAPAROSCOPY DIAGNOSTIC 09/2014  due to endometriosis  . R TKA revision polyethylene insert right total knee 12/20/2011 Right 12/20/2011  . Revision of all femoral and tibial components, right total knee Right 05/06/14  . SIGMOIDOSCOPY FLEXIBLE W/REMOVAL LESIONS BY SNARE N/A 11/28/2016  Procedure: flex sig; Surgeon: Kathreen Cosier, MD; Location: [facility]; Service: Gastroenterology; Laterality: N/A;  . Spurs removed 2008, 2009  Bilateral feet  . TONSILLECTOMY  1992  . total hip arthroplasty anterior approach Left 8.9.16  Dr.Kataleena Holsapple  . Total hip arthroplsty anterior approach Right 05/12/2015  Dr.Takako Minckler  . TOTAL KNEE REVISION (Right) Right 10/11/2016  Hessie Knows, MD  . Total knee revision/revise polyiethylene Right 09/22/2015  Dr.Haset Oaxaca  . TUBAL LIGATION   prior left carpal tunnel release and de Quervain's release    past Family History: Family History  Problem Relation Age of Onset  . Diabetes Mother  . High blood pressure (Hypertension) Mother  . High blood pressure (Hypertension) Father  . Diabetes Sister  . Colon cancer Paternal Grandmother  . Cancer Paternal Uncle  "hole in throat"   Medications: Current Outpatient Medications Ordered in Epic  Medication Sig Dispense Refill  . AMPHETAMINE-DEXTROAMPHETAMINE 30 MG tablet Take 30 mg by mouth 2 (two) times daily.   . ARIPiprazole (ABILIFY) 15 MG tablet (Patient taking differently: Take 15 mg by mouth once daily. )  . busPIRone (BUSPAR) 30 MG tablet Take 1 tablet by mouth 2 (two) times daily.   . clonazePAM (KLONOPIN) 1 MG tablet Take 1 mg by mouth 6 (six) times daily.   . cyclobenzaprine (FLEXERIL) 10 MG tablet Take 1 tablet by mouth 2 (two) times daily as needed.   Marland Kitchen dexlansoprazole (DEXILANT) 60 mg DR capsule (Patient taking differently: Take 60 mg by mouth once daily. )  . hydrOXYzine (ATARAX) 25 MG tablet Take 1 tablet by mouth as needed.  Marland Kitchen oxyCODONE (ROXICODONE) 10 mg tablet Take 1 tablet (10 mg total) by mouth 4 (four) times daily. 30 tablet 0  . oxyCODONE (ROXICODONE) 5 MG immediate release tablet Take 1-2 tablets (5-10 mg total) by mouth  every 6 (six) hours as needed. 40 tablet 0  . polyethylene glycol (MIRALAX) powder Take by mouth.  . sertraline (ZOLOFT) 100 MG tablet 100 mg 2 (two) times daily.  Marland Kitchen tiotropium (SPIRIVA) 18 mcg inhalation capsule Place 1 capsule into inhaler and inhale once daily.  . traZODone (DESYREL) 150 MG tablet Take 300 mg by mouth nightly.    No  current Epic-ordered facility-administered medications on file.   Allergies: Allergies  Allergen Reactions  . Codeine Nausea  . Sumatriptan Other (See Comments)  Chest pain    Body mass index is 42.91 kg/m.  Review of Systems:A comprehensive 14 point ROS was performed, reviewed, and the pertinent orthopaedic findings are documented in the HPI.  Vitals:  06/07/17 0831  BP: 120/80   General Physical Examination:  General/Constitutional: No apparent distress: well-nourished and well developed. Eyes: Pupils equal, round with synchronous movement. Lungs: Clear to auscultation HEENT: Normal Vascular: No edema, swelling or tenderness, except as noted in detailed exam. Cardiac: Heart rate and rhythm is regular. Integumentary: No impressive skin lesions present, except as noted in detailed exam. Neuro/Psych: Normal mood and affect, oriented to person, place and time.  Musculoskeletal Examination: On exam, the patient has a positive Tinel's sign on the right the ring and middle fingers on the right are positive at 6 seconds on Phalen's testing.    Lungs have some wheezing noted. Heart rate and rhythm is normal. HEENT is normal.  Radiographs: No new imaging studies were obtained or reviewed today.  Assessment: ICD-10-CM ICD-9-CM  1. Carpal tunnel syndrome, bilateral upper limbs G56.03 354.0   Plan: The patient has clinical findings of: 1.  Carpal tunnel syndrome with persistent symptoms.   We discussed her condition at length today. I recommend the patient have carpal tunnel release and de . She has failed injections, bracing and would like to have surgery. .   Surgical Risks: The nature of the condition and the proposed procedure has been reviewed in detail with the patient. Surgical versus non-surgical options and prognosis for recovery have been reviewed and the inherent risks and benefits of each have been discussed including the risks of infection, bleeding, injury to  nerves / blood vessels / tendons, incomplete relief of symptoms, persisting pain and / or stiffness, loss of function, complex regional pain syndrome, failure of procedure, as appropriate.

## 2017-10-10 NOTE — Anesthesia Procedure Notes (Signed)
Procedure Name: LMA Insertion Date/Time: 10/10/2017 2:27 PM Performed by: Eben Burow, CRNA Pre-anesthesia Checklist: Patient identified, Emergency Drugs available, Suction available, Patient being monitored and Timeout performed Patient Re-evaluated:Patient Re-evaluated prior to induction Oxygen Delivery Method: Circle system utilized Preoxygenation: Pre-oxygenation with 100% oxygen Induction Type: IV induction LMA: LMA inserted LMA Size: 4.0 Number of attempts: 1 Placement Confirmation: positive ETCO2 and breath sounds checked- equal and bilateral Tube secured with: Tape Dental Injury: Teeth and Oropharynx as per pre-operative assessment

## 2017-10-10 NOTE — OR Nursing (Signed)
Dr. Rudene Christians in to see pt postop at pt's request. Oxycodone RX tore up and MD wrote for Percocet at pt's request.

## 2017-10-10 NOTE — Transfer of Care (Signed)
Immediate Anesthesia Transfer of Care Note  Patient: Wendy Johnson  Procedure(s) Performed: CARPAL TUNNEL RELEASE (Right )  Patient Location: PACU  Anesthesia Type:General  Level of Consciousness: drowsy and patient cooperative  Airway & Oxygen Therapy: Patient Spontanous Breathing and Patient connected to nasal cannula oxygen  Post-op Assessment: Report given to RN and Post -op Vital signs reviewed and stable  Post vital signs: Reviewed and stable  Last Vitals:  Vitals Value Taken Time  BP 132/82 10/10/2017  2:55 PM  Temp    Pulse 74 10/10/2017  2:55 PM  Resp 15 10/10/2017  2:55 PM  SpO2 94 % 10/10/2017  2:55 PM  Vitals shown include unvalidated device data.  Last Pain:  Vitals:   10/10/17 1246  TempSrc:   PainSc: 7       Patients Stated Pain Goal: 2 (83/29/19 1660)  Complications: No apparent anesthesia complications

## 2017-10-10 NOTE — Discharge Instructions (Addendum)
Work on finger range of motion starting today loosen Ace wrap prior to discharge and if fingers well but leave cotton roll on.  Pain medicine as directed    AMBULATORY SURGERY  DISCHARGE INSTRUCTIONS   1) The drugs that you were given will stay in your system until tomorrow so for the next 24 hours you should not:  A) Drive an automobile B) Make any legal decisions C) Drink any alcoholic beverage   2) You may resume regular meals tomorrow.  Today it is better to start with liquids and gradually work up to solid foods.  You may eat anything you prefer, but it is better to start with liquids, then soup and crackers, and gradually work up to solid foods.   3) Please notify your doctor immediately if you have any unusual bleeding, trouble breathing, redness and pain at the surgery site, drainage, fever, or pain not relieved by medication.    4) Additional Instructions:        Please contact your physician with any problems or Same Day Surgery at [phone], Monday through Friday 6 am to 4 pm, or [place] [facility] number at [phone].

## 2017-10-11 ENCOUNTER — Encounter: Payer: Self-pay | Admitting: Orthopedic Surgery

## 2017-10-12 NOTE — Anesthesia Postprocedure Evaluation (Signed)
Anesthesia Post Note  Patient: Wendy Johnson  Procedure(s) Performed: CARPAL TUNNEL RELEASE (Right )  Patient location during evaluation: PACU Anesthesia Type: General Level of consciousness: awake and alert Pain management: pain level controlled Vital Signs Assessment: post-procedure vital signs reviewed and stable Respiratory status: spontaneous breathing, nonlabored ventilation, respiratory function stable and patient connected to nasal cannula oxygen Cardiovascular status: blood pressure returned to baseline and stable Postop Assessment: no apparent nausea or vomiting Anesthetic complications: no     Last Vitals:  Vitals:   10/10/17 1532 10/10/17 1539  BP: 110/77 123/83  Pulse: 75 80  Resp: 20 18  Temp:  (!) 36.3 C  SpO2: 91% 93%    Last Pain:  Vitals:   10/11/17 0911  TempSrc:   PainSc: 0-No pain                 Precious Haws Piscitello

## 2017-10-16 ENCOUNTER — Telehealth: Payer: Self-pay | Admitting: Anesthesiology

## 2017-10-16 DIAGNOSIS — Z9889 Other specified postprocedural states: Secondary | ICD-10-CM | POA: Diagnosis not present

## 2017-10-16 NOTE — Telephone Encounter (Signed)
Patient had carpal tunnel surgery 5 days ago and received #30 of Oxycodone. She saw Dr. Andree Elk in the [facility] and let him know

## 2017-10-17 ENCOUNTER — Telehealth: Payer: Self-pay | Admitting: Anesthesiology

## 2017-10-17 NOTE — Telephone Encounter (Signed)
Patient received another script for #30 oxycodone from Dr. Prescott Parma today for bruising and soreness from surgery. Has already received #30 from surgeon. For hands

## 2017-10-27 ENCOUNTER — Other Ambulatory Visit: Payer: Self-pay

## 2017-10-27 ENCOUNTER — Ambulatory Visit: Payer: Medicare Other | Attending: Anesthesiology | Admitting: Anesthesiology

## 2017-10-27 ENCOUNTER — Encounter: Payer: Self-pay | Admitting: Anesthesiology

## 2017-10-27 VITALS — BP 114/95 | HR 106 | Resp 102 | Ht 64.0 in | Wt 260.0 lb

## 2017-10-27 DIAGNOSIS — J449 Chronic obstructive pulmonary disease, unspecified: Secondary | ICD-10-CM | POA: Diagnosis not present

## 2017-10-27 DIAGNOSIS — G8918 Other acute postprocedural pain: Secondary | ICD-10-CM

## 2017-10-27 DIAGNOSIS — Z842 Family history of other diseases of the genitourinary system: Secondary | ICD-10-CM

## 2017-10-27 DIAGNOSIS — M5136 Other intervertebral disc degeneration, lumbar region: Secondary | ICD-10-CM

## 2017-10-27 DIAGNOSIS — F1190 Opioid use, unspecified, uncomplicated: Secondary | ICD-10-CM | POA: Diagnosis not present

## 2017-10-27 DIAGNOSIS — M545 Low back pain, unspecified: Secondary | ICD-10-CM

## 2017-10-27 DIAGNOSIS — M47812 Spondylosis without myelopathy or radiculopathy, cervical region: Secondary | ICD-10-CM | POA: Diagnosis not present

## 2017-10-27 DIAGNOSIS — F411 Generalized anxiety disorder: Secondary | ICD-10-CM

## 2017-10-27 DIAGNOSIS — M47816 Spondylosis without myelopathy or radiculopathy, lumbar region: Secondary | ICD-10-CM | POA: Diagnosis not present

## 2017-10-27 DIAGNOSIS — M25551 Pain in right hip: Secondary | ICD-10-CM | POA: Diagnosis not present

## 2017-10-27 DIAGNOSIS — G894 Chronic pain syndrome: Secondary | ICD-10-CM | POA: Diagnosis not present

## 2017-10-27 DIAGNOSIS — M542 Cervicalgia: Secondary | ICD-10-CM | POA: Diagnosis not present

## 2017-10-27 DIAGNOSIS — M8949 Other hypertrophic osteoarthropathy, multiple sites: Secondary | ICD-10-CM

## 2017-10-27 DIAGNOSIS — M159 Polyosteoarthritis, unspecified: Secondary | ICD-10-CM

## 2017-10-27 DIAGNOSIS — G4733 Obstructive sleep apnea (adult) (pediatric): Secondary | ICD-10-CM | POA: Diagnosis not present

## 2017-10-27 DIAGNOSIS — M150 Primary generalized (osteo)arthritis: Secondary | ICD-10-CM | POA: Diagnosis not present

## 2017-10-27 MED ORDER — OXYCODONE HCL 10 MG PO TABS
10.0000 mg | ORAL_TABLET | ORAL | 0 refills | Status: DC
Start: 2017-10-27 — End: 2017-12-12

## 2017-10-27 MED ORDER — OXYCODONE HCL 10 MG PO TABS: 10.0000 mg | ORAL_TABLET | ORAL | 0 refills | Status: DC

## 2017-10-27 NOTE — Progress Notes (Signed)
Safety precautions to be maintained throughout the outpatient stay will include: orient to surroundings, keep bed in low position, maintain call bell within reach at all times, provide assistance with transfer out of bed and ambulation.  

## 2017-10-27 NOTE — Patient Instructions (Signed)
You were given 2 prescriptions for Oxycodone today. 

## 2017-10-27 NOTE — Progress Notes (Signed)
Subjective:  Patient ID: Wendy Johnson, female    DOB: [birth year]  Age: 52 y.o. MRN: [id number]  CC: Joint Pain (entire body)   Procedure: None  HPI Wendy Johnson presents for reevaluation.  She was last seen 2 months ago and has had recent carpal tunnel release surgery on the right wrist.  She is recovering well from that.  She is taking her medications as prescribed and keeping good control over her usage.  No evidence of diverting or illicit use is noted.  The quality characteristic of distribution of her pain syndrome is otherwise stable and she continues to have hip knee and shoulder pain.  She also has occasional neck pain but this is all been stable in nature.  Medications continue to give her good functional lifestyle improvement per history and narcotic assessment sheet reviewed.  Otherwise she is in her usual state of health at this time.  Outpatient Medications Prior to Visit  Medication Sig Dispense Refill  . amphetamine-dextroamphetamine (ADDERALL) 30 MG tablet Take 30 mg by mouth 2 (two) times daily.    . busPIRone (BUSPAR) 30 MG tablet Take 30 mg by mouth 2 (two) times daily.     . clonazePAM (KLONOPIN) 2 MG tablet Take 2 mg by mouth 3 (three) times daily as needed for anxiety.    Marland Kitchen dexlansoprazole (DEXILANT) 60 MG capsule Take 1 capsule (60 mg total) by mouth daily. 30 capsule 5  . hydrOXYzine (ATARAX/VISTARIL) 25 MG tablet Take 25 mg by mouth every 6 (six) hours as needed for anxiety or itching (hives).    . naloxone (NARCAN) nasal spray 4 mg/0.1 mL For respiratory suppression from opioids 1 kit 2  . oxyCODONE-acetaminophen (PERCOCET) 5-325 MG tablet Take 1 tablet by mouth every 6 (six) hours as needed for severe pain. 30 tablet 0  . rosuvastatin (CRESTOR) 20 MG tablet Take 1 tablet (20 mg total) by mouth daily. 90 tablet 1  . sertraline (ZOLOFT) 100 MG tablet Take 100 mg by mouth 2 (two) times daily.    Marland Kitchen tiotropium (SPIRIVA HANDIHALER) 18 MCG inhalation capsule Place 1  capsule (18 mcg total) into inhaler and inhale daily. 30 capsule 5  . oxyCODONE (ROXICODONE) 5 MG immediate release tablet Take 1 tablet (5 mg total) by mouth every 4 (four) hours as needed for severe pain. 30 tablet 0  . Oxycodone HCl 10 MG TABS Take 1 tablet (10 mg total) by mouth every 4 (four) hours while awake. 120 tablet 0   No facility-administered medications prior to visit.     Review of Systems CNS: No confusion or sedation Cardiac: No angina or palpitations GI: No abdominal pain or constipation Constitutional: No nausea vomiting fevers or chills  Objective:  BP (!) 114/95   Pulse (!) 106   Resp (!) 102   Ht 5' 4"  (1.626 m)   Wt 260 lb (117.9 kg)   LMP 09/26/2014 Comment: Total  SpO2 100%   BMI 44.63 kg/m    BP Readings from Last 3 Encounters:  10/27/17 (!) 114/95  10/10/17 123/83  10/06/17 122/86     Wt Readings from Last 3 Encounters:  10/27/17 260 lb (117.9 kg)  10/10/17 268 lb (121.6 kg)  10/06/17 269 lb 14.4 oz (122.4 kg)     Physical Exam Pt is alert and oriented PERRL EOMI HEART IS RRR no murmur or rub LCTA no wheezing or rales MUSCULOSKELETAL reveals some paraspinous muscle tenderness.  No overt trigger points are noted in lumbar region.  Her lower extremity muscle tone and bulk is good and strength appears to be at baseline.  Her right wrist seems to be healing well.  Labs  Lab Results  Component Value Date   HGBA1C 5.9 (H) 07/19/2017   HGBA1C 5.4 04/21/2017   HGBA1C 5.2 01/23/2017   Lab Results  Component Value Date   MICROALBUR 20 07/28/2016   [place]  07/19/2017     Comment:     . LDL cholesterol not calculated. Triglyceride levels greater than 400 mg/dL invalidate calculated LDL results. . Reference range: <100 . Desirable range <100 mg/dL for primary prevention;   <70 mg/dL for patients with CHD or diabetic patients  with > or = 2 CHD risk factors. Marland Kitchen LDL-C is now calculated using the Martin-Hopkins  calculation, which is a  validated novel method providing  better accuracy than the Friedewald equation in the  estimation of LDL-C.  Cresenciano Genre et al. Annamaria Helling. 6979;480(16): 2061-2068  (http://education.[facility].com/faq/FAQ164)    CREATININE 0.64 07/19/2017    -------------------------------------------------------------------------------------------------------------------- Lab Results  Component Value Date   WBC 7.3 07/19/2017   HGB 14.1 07/19/2017   HCT 43.0 07/19/2017   PLT 276 07/19/2017   GLUCOSE 228 (H) 07/19/2017   CHOL 157 07/19/2017   TRIG 405 (H) 07/19/2017   HDL 30 (L) 07/19/2017   LDLCALC  07/19/2017     Comment:     . LDL cholesterol not calculated. Triglyceride levels greater than 400 mg/dL invalidate calculated LDL results. . Reference range: <100 . Desirable range <100 mg/dL for primary prevention;   <70 mg/dL for patients with CHD or diabetic patients  with > or = 2 CHD risk factors. Marland Kitchen LDL-C is now calculated using the Martin-Hopkins  calculation, which is a validated novel method providing  better accuracy than the Friedewald equation in the  estimation of LDL-C.  Cresenciano Genre et al. Annamaria Helling. 5537;482(70): 2061-2068  (http://education.[facility].com/faq/FAQ164)    ALT 21 07/19/2017   AST 15 07/19/2017   NA 137 07/19/2017   K 4.0 07/19/2017   CL 105 07/19/2017   CREATININE 0.64 07/19/2017   BUN 14 07/19/2017   CO2 23 07/19/2017   TSH 0.769 02/03/2016   INR 0.97 10/04/2016   HGBA1C 5.9 (H) 07/19/2017   MICROALBUR 20 07/28/2016    --------------------------------------------------------------------------------------------------------------------- No results found.   Assessment & Plan:   Wendy Johnson was seen today for joint pain.  Diagnoses and all orders for this visit:  Primary osteoarthritis involving multiple joints  Low back pain at multiple sites  Chronic, continuous use of opioids  Facet arthritis of lumbar region  Generalized anxiety  disorder  Chronic pain syndrome  DDD (degenerative disc disease), lumbar  Facet arthritis of cervical region  Pain of right hip joint  Cervicalgia  Post-op pain -     Discontinue: Oxycodone HCl 10 MG TABS; Take 1 tablet (10 mg total) by mouth every 4 (four) hours while awake. -     Oxycodone HCl 10 MG TABS; Take 1 tablet (10 mg total) by mouth every 4 (four) hours while awake.  FH: TAH-BSO (total abdominal hysterectomy and bilateral salpingo-oophorectomy) -     Discontinue: Oxycodone HCl 10 MG TABS; Take 1 tablet (10 mg total) by mouth every 4 (four) hours while awake. -     Oxycodone HCl 10 MG TABS; Take 1 tablet (10 mg total) by mouth every 4 (four) hours while awake.        ----------------------------------------------------------------------------------------------------------------------  Problem List Items Addressed This Visit  Unprioritized   DDD (degenerative disc disease), lumbar   Relevant Medications   Oxycodone HCl 10 MG TABS   Facet arthritis of lumbar region   Relevant Medications   Oxycodone HCl 10 MG TABS   Generalized anxiety disorder   Primary osteoarthritis involving multiple joints - Primary   Relevant Medications   Oxycodone HCl 10 MG TABS    Other Visit Diagnoses    Low back pain at multiple sites       Relevant Medications   Oxycodone HCl 10 MG TABS   Chronic, continuous use of opioids       Chronic pain syndrome       Facet arthritis of cervical region       Relevant Medications   Oxycodone HCl 10 MG TABS   Pain of right hip joint       Cervicalgia       Post-op pain       Relevant Medications   Oxycodone HCl 10 MG TABS   FH: TAH-BSO (total abdominal hysterectomy and bilateral salpingo-oophorectomy)       Relevant Medications   Oxycodone HCl 10 MG TABS        ----------------------------------------------------------------------------------------------------------------------  1. Primary osteoarthritis involving multiple  joints We will continue her on her current therapy.  Refills will be given for June 29 and July 29 for her opioid medications.  We once again reviewed the risks and benefit profile of chronic opioid medication management and she is aware of these.  We have also reviewed the [facility] practitioner database information and it is appropriate.  2. Low back pain at multiple sites As above  3. Chronic, continuous use of opioids As above.  She did have a recent urinary drug screen but reports that she was out her medication at the time of that.  We will schedule for a repeat UDS at her next visit.  4. Facet arthritis of lumbar region As above  5. Generalized anxiety disorder As above and continue follow-up with her primary care physicians.  6. Chronic pain syndrome   7. DDD (degenerative disc disease), lumbar   8. Facet arthritis of cervical region   9. Pain of right hip joint   10. Cervicalgia   11. Post-op pain Continue follow-up with her orthopedic doctors. - Oxycodone HCl 10 MG TABS; Take 1 tablet (10 mg total) by mouth every 4 (four) hours while awake.  Dispense: 120 tablet; Refill: 0  12. FH: TAH-BSO (total abdominal hysterectomy and bilateral salpingo-oophorectomy)  - Oxycodone HCl 10 MG TABS; Take 1 tablet (10 mg total) by mouth every 4 (four) hours while awake.  Dispense: 120 tablet; Refill: 0    ----------------------------------------------------------------------------------------------------------------------  I have discontinued Wendy Johnson "[name]"'s oxyCODONE. I am also having her maintain her amphetamine-dextroamphetamine, busPIRone, sertraline, hydrOXYzine, naloxone, dexlansoprazole, tiotropium, rosuvastatin, clonazePAM, oxyCODONE-acetaminophen, and Oxycodone HCl.   Meds ordered this encounter  Medications  . DISCONTD: Oxycodone HCl 10 MG TABS    Sig: Take 1 tablet (10 mg total) by mouth every 4 (four) hours while awake.    Dispense:  120 tablet     Refill:  0    Do not fill until [id number]  . Oxycodone HCl 10 MG TABS    Sig: Take 1 tablet (10 mg total) by mouth every 4 (four) hours while awake.    Dispense:  120 tablet    Refill:  0    Do not fill until [id number]   Patient's Medications  New Prescriptions   No  medications on file  Previous Medications   AMPHETAMINE-DEXTROAMPHETAMINE (ADDERALL) 30 MG TABLET    Take 30 mg by mouth 2 (two) times daily.   BUSPIRONE (BUSPAR) 30 MG TABLET    Take 30 mg by mouth 2 (two) times daily.    CLONAZEPAM (KLONOPIN) 2 MG TABLET    Take 2 mg by mouth 3 (three) times daily as needed for anxiety.   DEXLANSOPRAZOLE (DEXILANT) 60 MG CAPSULE    Take 1 capsule (60 mg total) by mouth daily.   HYDROXYZINE (ATARAX/VISTARIL) 25 MG TABLET    Take 25 mg by mouth every 6 (six) hours as needed for anxiety or itching (hives).   NALOXONE (NARCAN) NASAL SPRAY 4 MG/0.1 ML    For respiratory suppression from opioids   OXYCODONE-ACETAMINOPHEN (PERCOCET) 5-325 MG TABLET    Take 1 tablet by mouth every 6 (six) hours as needed for severe pain.   ROSUVASTATIN (CRESTOR) 20 MG TABLET    Take 1 tablet (20 mg total) by mouth daily.   SERTRALINE (ZOLOFT) 100 MG TABLET    Take 100 mg by mouth 2 (two) times daily.   TIOTROPIUM (SPIRIVA HANDIHALER) 18 MCG INHALATION CAPSULE    Place 1 capsule (18 mcg total) into inhaler and inhale daily.  Modified Medications   Modified Medication Previous Medication   OXYCODONE HCL 10 MG TABS Oxycodone HCl 10 MG TABS      Take 1 tablet (10 mg total) by mouth every 4 (four) hours while awake.    Take 1 tablet (10 mg total) by mouth every 4 (four) hours while awake.  Discontinued Medications   OXYCODONE (ROXICODONE) 5 MG IMMEDIATE RELEASE TABLET    Take 1 tablet (5 mg total) by mouth every 4 (four) hours as needed for severe pain.   ----------------------------------------------------------------------------------------------------------------------  Follow-up: Return in about 2 months (around  12/27/2017) for evaluation, med refill.    Molli Barrows, MD

## 2017-11-15 ENCOUNTER — Ambulatory Visit (INDEPENDENT_AMBULATORY_CARE_PROVIDER_SITE_OTHER): Payer: Medicare Other | Admitting: Surgery

## 2017-11-15 ENCOUNTER — Encounter: Payer: Self-pay | Admitting: Surgery

## 2017-11-15 VITALS — BP 144/96 | HR 114 | Temp 97.9°F | Ht 64.0 in | Wt 266.8 lb

## 2017-11-15 DIAGNOSIS — K432 Incisional hernia without obstruction or gangrene: Secondary | ICD-10-CM | POA: Diagnosis not present

## 2017-11-15 NOTE — Progress Notes (Signed)
Surgical Consultation  11/15/2017  Wendy Johnson is an 52 y.o. female.   Chief Complaint  Patient presents with  . Follow-up    discuss umbilical hernia repair     HPI: Wendy Johnson is a 52 year old female well-known to me with history of previous hysterectomy and gynecological diagnostic laparoscopy.  After this procedure she developed a small to medium size ventral hernia around the periumbilical region.  She reports that over the last few weeks the pain is unbearable.  I have talked to her and seen multiple times and discussed with her about hernia surgery and the importance of weight loss and smoking cessation.  She is lost about 8 pounds but she continues to smoke. She reports daily symptoms of pain around the hernia.  No evidence of incarceration or strangulation.  Past Medical History:  Diagnosis Date  . ADHD (attention deficit hyperactivity disorder)   . Anxiety   . AR (allergic rhinitis)   . Arthritis   . Benign hypertension    NO MEDS  . Bipolar disorder ([place])   . Chronic back pain   . Chronic constipation   . Chronic insomnia   . COPD (chronic obstructive pulmonary disease) ([place])   . Deaf    RIGHT EAR  . Decreased dorsalis pedis pulse   . Depression   . Diabetes mellitus without complication ([place])    PATIENT DENIES  . Dyslipidemia   . Fatty liver   . GERD (gastroesophageal reflux disease)   . Hepatomegaly   . High cholesterol   . Hot flashes   . Migraine with aura   . OCD (obsessive compulsive disorder)   . Plantar warts   . Severe obesity ([place])   . Shortness of breath dyspnea   . Sleep apnea    NO CPAP  . Tobacco use   . Trochanteric bursitis of right hip     Past Surgical History:  Procedure Laterality Date  . ABDOMINAL HYSTERECTOMY N/A 10/20/2014   Procedure: Total abdominial hysterectomy, bilateral salpingo-oophorectomy;  Surgeon: Brayton Mars, MD;  Location: [facility];  Service: Gynecology;  Laterality: N/A;  . BILATERAL SALPINGOOPHORECTOMY     . bone spurs removed Bilateral   . CARPAL TUNNEL RELEASE Left 06/08/2017   Procedure: CARPAL TUNNEL RELEASE;  Surgeon: Hessie Knows, MD;  Location: [facility];  Service: Orthopedics;  Laterality: Left;  . CARPAL TUNNEL RELEASE Right 10/10/2017   Procedure: CARPAL TUNNEL RELEASE;  Surgeon: Hessie Knows, MD;  Location: [facility];  Service: Orthopedics;  Laterality: Right;  . COLONOSCOPY WITH PROPOFOL N/A 09/01/2016   Procedure: COLONOSCOPY WITH PROPOFOL;  Surgeon: Jonathon Bellows, MD;  Location: [facility];  Service: Endoscopy;  Laterality: N/A;  . DORSAL COMPARTMENT RELEASE Left 06/08/2017   Procedure: RELEASE DORSAL COMPARTMENT (DEQUERVAIN);  Surgeon: Hessie Knows, MD;  Location: [facility];  Service: Orthopedics;  Laterality: Left;  . ESOPHAGOGASTRODUODENOSCOPY (EGD) WITH PROPOFOL N/A 09/01/2016   Procedure: ESOPHAGOGASTRODUODENOSCOPY (EGD) WITH PROPOFOL;  Surgeon: Jonathon Bellows, MD;  Location: [facility];  Service: Endoscopy;  Laterality: N/A;  . FOOT SURGERY Bilateral   . JOINT REPLACEMENT Right    Total Knee replacement X 2  . JOINT REPLACEMENT Bilateral    Total Hip Replacement  . knee arthroscopo Right   . LAPAROSCOPY  09/22/2014   Procedure: LAPAROSCOPY OPERATIVE;  Surgeon: Brayton Mars, MD;  Location: [facility];  Service: Gynecology;;  excision and fulgeration of endomertriosis  . LIPOMA EXCISION    . TONSILLECTOMY    . TOTAL HIP ARTHROPLASTY Left 12/16/2014  Procedure: TOTAL HIP ARTHROPLASTY ANTERIOR APPROACH;  Surgeon: Hessie Knows, MD;  Location: [facility];  Service: Orthopedics;  Laterality: Left;  . TOTAL HIP ARTHROPLASTY Right 05/12/2015   Procedure: TOTAL HIP ARTHROPLASTY ANTERIOR APPROACH;  Surgeon: Hessie Knows, MD;  Location: [facility];  Service: Orthopedics;  Laterality: Right;  . TOTAL KNEE REVISION Right 09/22/2015   Procedure: TOTAL KNEE REVISION/ REVISE POLYIETHYLENE;  Surgeon: Hessie Knows, MD;  Location: [facility];  Service: Orthopedics;  Laterality: Right;  . TOTAL KNEE  REVISION Right 10/11/2016   Procedure: TOTAL KNEE REVISION;  Surgeon: Hessie Knows, MD;  Location: [facility];  Service: Orthopedics;  Laterality: Right;  . TUBAL LIGATION      Family History  Problem Relation Age of Onset  . Diabetes Mother   . Heart disease Father   . Heart attack Father   . Diabetes Sister   . Breast cancer Maternal Aunt        <50  . Breast cancer Paternal Aunt        x2.  <50  . Healthy Son   . Drug abuse Sister     Social History:  reports that she has been smoking cigarettes.  She started smoking about 37 years ago. She has a 18.50 pack-year smoking history. She has never used smokeless tobacco. She reports that she does not drink alcohol or use drugs.  Allergies:  Allergies  Allergen Reactions  . Codeine Nausea Only  . Imitrex [Sumatriptan] Other (See Comments)    Chest pain    Medications reviewed.     ROS Full ROS performed and is otherwise negative other than what is stated in the HPI    BP (!) 144/96   Pulse (!) 114   Temp 97.9 F (36.6 C) (Oral)   Ht 5\' 4"  (1.626 m)   Wt 121 kg (266 lb 12.8 oz)   LMP 09/26/2014 Comment: Total  BMI 45.80 kg/m   Physical Exam  Constitutional: She is oriented to person, place, and time. She appears well-developed and well-nourished. No distress.  HENT:  Head: Normocephalic and atraumatic.  Neck: Normal range of motion. Neck supple. No JVD present. No tracheal deviation present.  Cardiovascular: Normal rate and regular rhythm. Exam reveals no friction rub.  No murmur heard. Pulmonary/Chest: Effort normal and breath sounds normal. No stridor. No respiratory distress.  Abdominal: Soft. She exhibits no mass. There is tenderness. There is no rebound and no guarding. No hernia.  Tender reducible periumbilical ventral hernia, no peritonitis  Musculoskeletal: Normal range of motion. She exhibits no edema.  Lymphadenopathy:    She has no cervical adenopathy.  Neurological: She is alert and oriented to  person, place, and time. She displays normal reflexes. No cranial nerve deficit. She exhibits normal muscle tone. Coordination normal.  Skin: Skin is warm. Capillary refill takes less than 2 seconds.  Psychiatric: She has a normal mood and affect. Her behavior is normal. Judgment and thought content normal.  Nursing note and vitals reviewed.    Assessment/Plan: Fairly symptomatic ventral hernia and a 52 year old female that continues to smoke.  She did lose some weight but she is obese.  Had a lengthy discussion with the patient and she assured me that she will quit smoking as long as I schedule her surgery.  She is committed to this.  We will tentatively scheduled for robotic ventral hernia repair with mesh in about 6 weeks but I will see her 1 week before scheduled surgery to make sure that she is quit  smoking.  If she continues to smoke I will cancel her surgery.  I had a lengthy discussion with the patient about outcomes and hernia surgery and multiple factors.  She has lost some weight but we still need to modify the smoking component.  She is very committed and wishes to undergo robotic hernia surgery.  Discussed with the patient detail about the risk benefit and possible complications including but not limited to: Bleeding, recurrence, wound complications, infection and re-interventions.  She understands and agrees I Spent more than 25 minutes in this encounter with greater than 50% spent in coordination and counseling of her care  Caroleen Hamman, MD [place] [facility]

## 2017-11-15 NOTE — Patient Instructions (Addendum)
Please quit smoking. We will schedule your surgery at your next follow up surgery.

## 2017-11-21 ENCOUNTER — Telehealth: Payer: Self-pay | Admitting: Surgery

## 2017-11-21 DIAGNOSIS — Z0100 Encounter for examination of eyes and vision without abnormal findings: Secondary | ICD-10-CM | POA: Diagnosis not present

## 2017-11-21 DIAGNOSIS — E119 Type 2 diabetes mellitus without complications: Secondary | ICD-10-CM | POA: Diagnosis not present

## 2017-11-21 LAB — HM DIABETES EYE EXAM

## 2017-11-21 NOTE — Telephone Encounter (Signed)
Pt advised of pre op date/time and sx date. Sx: 12/26/17 with Dr Kris Mouton laparoscopic ventral hernia repair with mesh. Pre op: 12/20/17 @ 11:00am--office interview.   Patient made aware to call [phone], between 1-3:00pm the day before surgery, to find out what time to arrive.

## 2017-11-26 DIAGNOSIS — G4733 Obstructive sleep apnea (adult) (pediatric): Secondary | ICD-10-CM | POA: Diagnosis not present

## 2017-11-26 DIAGNOSIS — J449 Chronic obstructive pulmonary disease, unspecified: Secondary | ICD-10-CM | POA: Diagnosis not present

## 2017-11-29 DIAGNOSIS — M7061 Trochanteric bursitis, right hip: Secondary | ICD-10-CM | POA: Diagnosis not present

## 2017-11-29 DIAGNOSIS — M7062 Trochanteric bursitis, left hip: Secondary | ICD-10-CM | POA: Diagnosis not present

## 2017-12-04 ENCOUNTER — Telehealth: Payer: Self-pay | Admitting: Surgery

## 2017-12-04 NOTE — Telephone Encounter (Signed)
Patient stated she  vomited and had diarrhea yesterday. She has not had any today however she said she still does not feel good, feels weak. Patient stated if she continues to feel ill she will go to the emergency room.

## 2017-12-04 NOTE — Telephone Encounter (Signed)
Patient states surgery is 8/20 but since yesterday morning she has been vomiting green - please advise if she should go to ED.

## 2017-12-12 ENCOUNTER — Ambulatory Visit: Payer: Medicare Other | Attending: Anesthesiology | Admitting: Anesthesiology

## 2017-12-12 ENCOUNTER — Other Ambulatory Visit: Payer: Self-pay

## 2017-12-12 ENCOUNTER — Encounter: Payer: Self-pay | Admitting: Anesthesiology

## 2017-12-12 VITALS — BP 140/91 | HR 76 | Temp 98.4°F | Resp 16 | Ht 65.0 in | Wt 265.0 lb

## 2017-12-12 DIAGNOSIS — M25552 Pain in left hip: Secondary | ICD-10-CM | POA: Diagnosis not present

## 2017-12-12 DIAGNOSIS — M545 Low back pain, unspecified: Secondary | ICD-10-CM

## 2017-12-12 DIAGNOSIS — M1991 Primary osteoarthritis, unspecified site: Secondary | ICD-10-CM | POA: Diagnosis not present

## 2017-12-12 DIAGNOSIS — Z760 Encounter for issue of repeat prescription: Secondary | ICD-10-CM | POA: Insufficient documentation

## 2017-12-12 DIAGNOSIS — M150 Primary generalized (osteo)arthritis: Secondary | ICD-10-CM | POA: Diagnosis not present

## 2017-12-12 DIAGNOSIS — M25551 Pain in right hip: Secondary | ICD-10-CM | POA: Diagnosis not present

## 2017-12-12 DIAGNOSIS — M5136 Other intervertebral disc degeneration, lumbar region: Secondary | ICD-10-CM

## 2017-12-12 DIAGNOSIS — M542 Cervicalgia: Secondary | ICD-10-CM | POA: Diagnosis not present

## 2017-12-12 DIAGNOSIS — Z79899 Other long term (current) drug therapy: Secondary | ICD-10-CM | POA: Diagnosis not present

## 2017-12-12 DIAGNOSIS — M25561 Pain in right knee: Secondary | ICD-10-CM | POA: Insufficient documentation

## 2017-12-12 DIAGNOSIS — M4686 Other specified inflammatory spondylopathies, lumbar region: Secondary | ICD-10-CM | POA: Insufficient documentation

## 2017-12-12 DIAGNOSIS — G894 Chronic pain syndrome: Secondary | ICD-10-CM

## 2017-12-12 DIAGNOSIS — Z842 Family history of other diseases of the genitourinary system: Secondary | ICD-10-CM

## 2017-12-12 DIAGNOSIS — M159 Polyosteoarthritis, unspecified: Secondary | ICD-10-CM

## 2017-12-12 DIAGNOSIS — G8918 Other acute postprocedural pain: Secondary | ICD-10-CM

## 2017-12-12 DIAGNOSIS — M25562 Pain in left knee: Secondary | ICD-10-CM | POA: Diagnosis not present

## 2017-12-12 DIAGNOSIS — M47816 Spondylosis without myelopathy or radiculopathy, lumbar region: Secondary | ICD-10-CM | POA: Diagnosis not present

## 2017-12-12 DIAGNOSIS — M47812 Spondylosis without myelopathy or radiculopathy, cervical region: Secondary | ICD-10-CM

## 2017-12-12 DIAGNOSIS — M8949 Other hypertrophic osteoarthropathy, multiple sites: Secondary | ICD-10-CM

## 2017-12-12 DIAGNOSIS — F1190 Opioid use, unspecified, uncomplicated: Secondary | ICD-10-CM

## 2017-12-12 DIAGNOSIS — Z79891 Long term (current) use of opiate analgesic: Secondary | ICD-10-CM | POA: Insufficient documentation

## 2017-12-12 MED ORDER — OXYCODONE HCL 10 MG PO TABS: 10.0000 mg | ORAL_TABLET | ORAL | 0 refills | Status: DC

## 2017-12-12 MED ORDER — OXYCODONE HCL 10 MG PO TABS
10.0000 mg | ORAL_TABLET | ORAL | 0 refills | Status: DC
Start: 2017-12-12 — End: 2018-02-08

## 2017-12-12 NOTE — Progress Notes (Signed)
Subjective:  Patient ID: Wendy Johnson, female    DOB: April 16, 1966  Age: 52 y.o. MRN: [id number]  CC: Medication Refill   Procedure: None  HPI Wendy [facility] presents for reevaluation.  She was last seen 2 months ago and continues to have severe low back pain intermittent neck pain and diffuse body pain with bilateral hip and knee pain.  She is taking her medications as prescribed for her chronic pain syndrome.  She tolerates these well and based on her narcotic assessment sheet she continues to derive good functional lifestyle improvement with the medications and good pain relief.  She has failed conservative therapy with previous injection therapy and physical therapy yielding an effective relief.  Otherwise she is in her usual state of health right now.  She mentions that she is due for umbilical hernia repair in the near future.  Outpatient Medications Prior to Visit  Medication Sig Dispense Refill  . amphetamine-dextroamphetamine (ADDERALL) 30 MG tablet Take 30 mg by mouth 2 (two) times daily.    . ARIPiprazole (ABILIFY) 5 MG tablet     . busPIRone (BUSPAR) 30 MG tablet Take 30 mg by mouth 2 (two) times daily.     . clonazePAM (KLONOPIN) 2 MG tablet Take 2 mg by mouth 3 (three) times daily as needed for anxiety.    Marland Kitchen dexlansoprazole (DEXILANT) 60 MG capsule Take 1 capsule (60 mg total) by mouth daily. 30 capsule 5  . hydrOXYzine (ATARAX/VISTARIL) 25 MG tablet Take 25 mg by mouth every 6 (six) hours as needed for anxiety or itching (hives).    . naloxone (NARCAN) nasal spray 4 mg/0.1 mL For respiratory suppression from opioids 1 kit 2  . rosuvastatin (CRESTOR) 20 MG tablet Take 1 tablet (20 mg total) by mouth daily. 90 tablet 1  . sertraline (ZOLOFT) 100 MG tablet Take 100 mg by mouth 2 (two) times daily.    Marland Kitchen tiotropium (SPIRIVA HANDIHALER) 18 MCG inhalation capsule Place 1 capsule (18 mcg total) into inhaler and inhale daily. 30 capsule 5  . traZODone (DESYREL) 100 MG tablet     .  Oxycodone HCl 10 MG TABS Take 1 tablet (10 mg total) by mouth every 4 (four) hours while awake. 120 tablet 0   No facility-administered medications prior to visit.     Review of Systems CNS: No confusion or sedation Cardiac: No angina or palpitations GI: No abdominal pain or constipation Institutional: No nausea vomiting fevers or chills  Objective:  BP (!) 140/91   Pulse 76   Temp 98.4 F (36.9 C)   Resp 16   Ht 5' 5" (1.651 m)   Wt 265 lb (120.2 kg)   LMP 09/26/2014 Comment: Total  SpO2 96%   BMI 44.10 kg/m    BP Readings from Last 3 Encounters:  12/12/17 (!) 140/91  11/15/17 (!) 144/96  10/27/17 (!) 114/95     Wt Readings from Last 3 Encounters:  12/12/17 265 lb (120.2 kg)  11/15/17 266 lb 12.8 oz (121 kg)  10/27/17 260 lb (117.9 kg)     Physical Exam Pt is alert and oriented PERRL EOMI HEART IS RRR no murmur or rub LCTA no wheezing or rales MUSCULOSKELETAL feels some paraspinous muscle tenderness but no overt trigger points.  Her muscle tone and bulk to the lower extremities is at baseline she is ambulating with an antalgic gait.  Labs  Lab Results  Component Value Date   HGBA1C 5.9 (H) 07/19/2017   HGBA1C 5.4 04/21/2017  HGBA1C 5.2 01/23/2017   Lab Results  Component Value Date   MICROALBUR 20 07/28/2016   [place]  07/19/2017     Comment:     . LDL cholesterol not calculated. Triglyceride levels greater than 400 mg/dL invalidate calculated LDL results. . Reference range: <100 . Desirable range <100 mg/dL for primary prevention;   <70 mg/dL for patients with CHD or diabetic patients  with > or = 2 CHD risk factors. Marland Kitchen LDL-C is now calculated using the Martin-Hopkins  calculation, which is a validated novel method providing  better accuracy than the Friedewald equation in the  estimation of LDL-C.  Cresenciano Genre et al. Annamaria Helling. 5056;979(48): 2061-2068  (http://education.[facility].com/faq/FAQ164)    CREATININE 0.64 07/19/2017     -------------------------------------------------------------------------------------------------------------------- Lab Results  Component Value Date   WBC 7.3 07/19/2017   HGB 14.1 07/19/2017   HCT 43.0 07/19/2017   PLT 276 07/19/2017   GLUCOSE 228 (H) 07/19/2017   CHOL 157 07/19/2017   TRIG 405 (H) 07/19/2017   HDL 30 (L) 07/19/2017   LDLCALC  07/19/2017     Comment:     . LDL cholesterol not calculated. Triglyceride levels greater than 400 mg/dL invalidate calculated LDL results. . Reference range: <100 . Desirable range <100 mg/dL for primary prevention;   <70 mg/dL for patients with CHD or diabetic patients  with > or = 2 CHD risk factors. Marland Kitchen LDL-C is now calculated using the Martin-Hopkins  calculation, which is a validated novel method providing  better accuracy than the Friedewald equation in the  estimation of LDL-C.  Cresenciano Genre et al. Annamaria Helling. 0165;537(48): 2061-2068  (http://education.[facility].com/faq/FAQ164)    ALT 21 07/19/2017   AST 15 07/19/2017   NA 137 07/19/2017   K 4.0 07/19/2017   CL 105 07/19/2017   CREATININE 0.64 07/19/2017   BUN 14 07/19/2017   CO2 23 07/19/2017   TSH 0.769 02/03/2016   INR 0.97 10/04/2016   HGBA1C 5.9 (H) 07/19/2017   MICROALBUR 20 07/28/2016    --------------------------------------------------------------------------------------------------------------------- No results found.   Assessment & Plan:   Wendy Johnson was seen today for medication refill.  Diagnoses and all orders for this visit:  Primary osteoarthritis involving multiple joints  Low back pain at multiple sites  Chronic, continuous use of opioids  Facet arthritis of lumbar region  Chronic pain syndrome  DDD (degenerative disc disease), lumbar  Facet arthritis of cervical region  Pain of right hip joint  Cervicalgia  Post-op pain -     Discontinue: Oxycodone HCl 10 MG TABS; Take 1 tablet (10 mg total) by mouth every 4 (four) hours while  awake. -     Oxycodone HCl 10 MG TABS; Take 1 tablet (10 mg total) by mouth every 4 (four) hours while awake.  FH: TAH-BSO (total abdominal hysterectomy and bilateral salpingo-oophorectomy) -     Discontinue: Oxycodone HCl 10 MG TABS; Take 1 tablet (10 mg total) by mouth every 4 (four) hours while awake. -     Oxycodone HCl 10 MG TABS; Take 1 tablet (10 mg total) by mouth every 4 (four) hours while awake.        ----------------------------------------------------------------------------------------------------------------------  Problem List Items Addressed This Visit      Unprioritized   DDD (degenerative disc disease), lumbar   Relevant Medications   Oxycodone HCl 10 MG TABS   Facet arthritis of lumbar region   Relevant Medications   Oxycodone HCl 10 MG TABS   Primary osteoarthritis involving multiple joints - Primary   Relevant Medications  Oxycodone HCl 10 MG TABS    Other Visit Diagnoses    Low back pain at multiple sites       Relevant Medications   Oxycodone HCl 10 MG TABS   Chronic, continuous use of opioids       Chronic pain syndrome       Facet arthritis of cervical region       Relevant Medications   Oxycodone HCl 10 MG TABS   Pain of right hip joint       Cervicalgia       Post-op pain       Relevant Medications   Oxycodone HCl 10 MG TABS   FH: TAH-BSO (total abdominal hysterectomy and bilateral salpingo-oophorectomy)       Relevant Medications   Oxycodone HCl 10 MG TABS        ----------------------------------------------------------------------------------------------------------------------  1. Primary osteoarthritis involving multiple joints She is doing well with her current medication regimen.  We will plan on refilling this today.  I have reviewed the [facility] practitioner database information and it is appropriate.  We will refill for August 28 and September 27.  She is due to see her surgeon for an umbilical hernia repair as well.  She  is scheduled for return appointment in 2 months  2. Low back pain at multiple sites To new back stretching strengthening exercises.  3. Chronic, continuous use of opioids As above  4. Facet arthritis of lumbar region As above  5. Chronic pain syndrome As above  6. DDD (degenerative disc disease), lumbar   7. Facet arthritis of cervical region   8. Pain of right hip joint   9. Cervicalgia   10. Post-op pain  - Oxycodone HCl 10 MG TABS; Take 1 tablet (10 mg total) by mouth every 4 (four) hours while awake.  Dispense: 120 tablet; Refill: 0  11. FH: TAH-BSO (total abdominal hysterectomy and bilateral salpingo-oophorectomy)  - Oxycodone HCl 10 MG TABS; Take 1 tablet (10 mg total) by mouth every 4 (four) hours while awake.  Dispense: 120 tablet; Refill: 0    ----------------------------------------------------------------------------------------------------------------------  I am having Wendy Johnson. Wendy "[name]" maintain her amphetamine-dextroamphetamine, busPIRone, sertraline, hydrOXYzine, naloxone, dexlansoprazole, tiotropium, rosuvastatin, clonazePAM, ARIPiprazole, traZODone, and Oxycodone HCl.   Meds ordered this encounter  Medications  . DISCONTD: Oxycodone HCl 10 MG TABS    Sig: Take 1 tablet (10 mg total) by mouth every 4 (four) hours while awake.    Dispense:  120 tablet    Refill:  0    Do not fill until [id number]  . Oxycodone HCl 10 MG TABS    Sig: Take 1 tablet (10 mg total) by mouth every 4 (four) hours while awake.    Dispense:  120 tablet    Refill:  0    Do not fill until [id number]   Patient's Medications  New Prescriptions   No medications on file  Previous Medications   AMPHETAMINE-DEXTROAMPHETAMINE (ADDERALL) 30 MG TABLET    Take 30 mg by mouth 2 (two) times daily.   ARIPIPRAZOLE (ABILIFY) 5 MG TABLET       BUSPIRONE (BUSPAR) 30 MG TABLET    Take 30 mg by mouth 2 (two) times daily.    CLONAZEPAM (KLONOPIN) 2 MG TABLET    Take 2 mg by mouth 3  (three) times daily as needed for anxiety.   DEXLANSOPRAZOLE (DEXILANT) 60 MG CAPSULE    Take 1 capsule (60 mg total) by mouth daily.   HYDROXYZINE (ATARAX/VISTARIL) 25 MG  TABLET    Take 25 mg by mouth every 6 (six) hours as needed for anxiety or itching (hives).   NALOXONE (NARCAN) NASAL SPRAY 4 MG/0.1 ML    For respiratory suppression from opioids   ROSUVASTATIN (CRESTOR) 20 MG TABLET    Take 1 tablet (20 mg total) by mouth daily.   SERTRALINE (ZOLOFT) 100 MG TABLET    Take 100 mg by mouth 2 (two) times daily.   TIOTROPIUM (SPIRIVA HANDIHALER) 18 MCG INHALATION CAPSULE    Place 1 capsule (18 mcg total) into inhaler and inhale daily.   TRAZODONE (DESYREL) 100 MG TABLET      Modified Medications   Modified Medication Previous Medication   OXYCODONE HCL 10 MG TABS Oxycodone HCl 10 MG TABS      Take 1 tablet (10 mg total) by mouth every 4 (four) hours while awake.    Take 1 tablet (10 mg total) by mouth every 4 (four) hours while awake.  Discontinued Medications   No medications on file   ----------------------------------------------------------------------------------------------------------------------  Follow-up: Return in about 2 months (around 02/11/2018) for evaluation, med refill.    Molli Barrows, MD

## 2017-12-12 NOTE — Progress Notes (Signed)
Safety precautions to be maintained throughout the outpatient stay will include: orient to surroundings, keep bed in low position, maintain call bell within reach at all times, provide assistance with transfer out of bed and ambulation.   Nursing Pain Medication Assessment:  Safety precautions to be maintained throughout the outpatient stay will include: orient to surroundings, keep bed in low position, maintain call bell within reach at all times, provide assistance with transfer out of bed and ambulation.  Medication Inspection Compliance: Wendy Johnson did not comply with our request to bring her pills to be counted. She was reminded that bringing the medication bottles, even when empty, is a requirement.  Medication: None brought in. Pill/Patch Count: None available to be counted. Bottle Appearance: No container available. Did not bring bottle(s) to appointment. Filled Date: N/A Last Medication intake:  Today

## 2017-12-18 ENCOUNTER — Other Ambulatory Visit: Payer: Self-pay

## 2017-12-18 ENCOUNTER — Ambulatory Visit: Payer: Self-pay | Admitting: Surgery

## 2017-12-18 NOTE — Patient Outreach (Signed)
[facility]) Care Management  12/18/2017  Wendy Johnson [facility] May 05, 1966 [id number]   Medication Adherence call to Mr. Wendy Johnson left a message for patient to call back patient is due on Olmesartan 20 mg. Wendy Johnson is showing past due under [facility].   [facility] Management Direct Dial [phone]  Fax [phone] [email or web address]

## 2017-12-19 ENCOUNTER — Other Ambulatory Visit: Payer: Medicare Other

## 2017-12-20 ENCOUNTER — Encounter
Admission: RE | Admit: 2017-12-20 | Discharge: 2017-12-20 | Disposition: A | Discharge status: Home / Self Care | Payer: Medicare Other | Source: Ambulatory Visit | Attending: Surgery | Admitting: Surgery

## 2017-12-20 ENCOUNTER — Other Ambulatory Visit: Payer: Self-pay

## 2017-12-20 ENCOUNTER — Ambulatory Visit: Payer: Self-pay | Admitting: Surgery

## 2017-12-20 DIAGNOSIS — Z01812 Encounter for preprocedural laboratory examination: Secondary | ICD-10-CM | POA: Diagnosis not present

## 2017-12-20 LAB — BASIC METABOLIC PANEL
ANION GAP: 5 (ref 5–15)
BUN: 12 mg/dL (ref 6–20)
CALCIUM: 9.2 mg/dL (ref 8.9–10.3)
CO2: 29 mmol/L (ref 22–32)
Chloride: 104 mmol/L (ref 98–111)
Creatinine, Ser: 0.67 mg/dL (ref 0.44–1.00)
Glucose, Bld: 92 mg/dL (ref 70–99)
Potassium: 4 mmol/L (ref 3.5–5.1)
SODIUM: 138 mmol/L (ref 135–145)

## 2017-12-20 LAB — CBC
HCT: 40.9 % (ref 35.0–47.0)
Hemoglobin: 13.8 g/dL (ref 12.0–16.0)
MCH: 28.5 pg (ref 26.0–34.0)
MCHC: 33.7 g/dL (ref 32.0–36.0)
MCV: 84.7 fL (ref 80.0–100.0)
PLATELETS: 223 10*3/uL (ref 150–440)
RBC: 4.83 MIL/uL (ref 3.80–5.20)
RDW: 17 % — AB (ref 11.5–14.5)
WBC: 10.2 10*3/uL (ref 3.6–11.0)

## 2017-12-20 NOTE — Patient Instructions (Signed)
Your procedure is scheduled on:  Tuesday, December 26, 2017 Report to Day Surgery on the 2nd floor of the [facility]. To find out your arrival time, please call [phone] between 1PM - 3PM on: Monday, December 25, 2017  REMEMBER: Instructions that are not followed completely may result in serious medical risk, up to and including death; or upon the discretion of your surgeon and anesthesiologist your surgery may need to be rescheduled.  Do not eat food after midnight the night before surgery.  No gum chewing, lozengers or hard candies.  You may however, drink CLEAR liquids up to 2 hours before you are scheduled to arrive for your surgery. Do not drink anything within 2 hours of the start of your surgery.  Clear liquids include: - water  - apple juice without pulp - gatorade - black coffee or tea (Do NOT add milk or creamers to the coffee or tea) Do NOT drink anything that is not on this list.  No Alcohol for 24 hours before or after surgery.  No Smoking including e-cigarettes for 24 hours prior to surgery.  No chewable tobacco products for at least 6 hours prior to surgery.  No nicotine patches on the day of surgery.  On the morning of surgery brush your teeth with toothpaste and water, you may rinse your mouth with mouthwash if you wish. Do not swallow any toothpaste or mouthwash.  Notify your doctor if there is any change in your medical condition (cold, fever, infection).  Do not wear jewelry, make-up, hairpins, clips or nail polish.  Do not wear lotions, powders, or perfumes. You may wear deodorant.  Do not shave 48 hours prior to surgery.   Contacts and dentures may not be worn into surgery.  Do not bring valuables to the hospital, including drivers license, insurance or credit cards.  [facility] is not responsible for any belongings or valuables.   TAKE THESE MEDICATIONS THE MORNING OF SURGERY:  1.  ALBUTEROL INHALER 2.  BUSPIRONE 3.  CLONAZEPAM 4.  DEXILANT  (take one the night before surgery and one the morning of surgery - helps prevent nausea after surgery) 5.  SERTRALINE 6.  SPIRIVA INHALER  Use CHG Soap as directed on instruction sheet.  Use inhalers on the day of surgery and bring to the hospital.  NOW!  Stop Anti-inflammatories (NSAIDS) such as Advil, Aleve, Ibuprofen, Motrin, Naproxen, Naprosyn and Aspirin based products such as Excedrin, Goodys Powder, BC Powder. (May take Tylenol or Acetaminophen if needed.)  NOW!  Stop ANY OVER THE COUNTER supplements until after surgery. (May continue Vitamin B.)  Wear comfortable clothing (specific to your surgery type) to the hospital.  Plan for stool softeners for home use.  If you are being admitted to the hospital overnight, leave your suitcase in the car. After surgery it may be brought to your room.  If you are being discharged the day of surgery, you will not be allowed to drive home. You will need a responsible adult to drive you home and stay with you that night.   If you are taking public transportation, you will need to have a responsible adult with you. Please confirm with your physician that it is acceptable to use public transportation.   Please call [phone] if you have any questions about these instructions.

## 2017-12-25 ENCOUNTER — Ambulatory Visit (INDEPENDENT_AMBULATORY_CARE_PROVIDER_SITE_OTHER): Payer: Medicare Other | Admitting: Surgery

## 2017-12-25 ENCOUNTER — Other Ambulatory Visit
Admission: RE | Admit: 2017-12-25 | Discharge: 2017-12-25 | Disposition: A | Discharge status: Home / Self Care | Payer: Medicare Other | Source: Ambulatory Visit | Attending: Surgery | Admitting: Surgery

## 2017-12-25 ENCOUNTER — Encounter: Payer: Self-pay | Admitting: Surgery

## 2017-12-25 VITALS — BP 132/70 | HR 74 | Temp 97.3°F | Ht 63.0 in | Wt 271.0 lb

## 2017-12-25 DIAGNOSIS — K439 Ventral hernia without obstruction or gangrene: Secondary | ICD-10-CM | POA: Insufficient documentation

## 2017-12-25 DIAGNOSIS — K432 Incisional hernia without obstruction or gangrene: Secondary | ICD-10-CM | POA: Diagnosis not present

## 2017-12-25 MED ORDER — DEXTROSE 5 % IV SOLN
3.0000 g | INTRAVENOUS | Status: AC
  Administered 2017-12-26: 3 g via INTRAVENOUS
  Filled 2017-12-25: qty 3

## 2017-12-25 NOTE — Addendum Note (Signed)
Addended by: Chrystie Nose on: 12/25/2017 10:23 AM   Modules accepted: Orders

## 2017-12-25 NOTE — Progress Notes (Signed)
Outpatient Surgical Follow Up  12/25/2017  Wendy Johnson is an 52 y.o. female.   Chief Complaint  Patient presents with  . Follow-up    HPI: 52 year old female with a history of bipolar disorder, obesity and previous smoker that presents with a symptomatic incisional hernia after diagnostic laparoscopy for gynecological procedures.  She describes that she still has persistent pain that is moderate to severe around the periumbilical area and epigastric area.  The pain is sharp and intermittent.  She did have apparently some emesis the other day.  No evidence of obstruction or incarceration.  CT scan shows evidence of an infraumbilical hernia. She quit smoking in anticipation for her hernia repair. She does have a hx of chronic pain and is being managed by the pain clinic.  Past Medical History:  Diagnosis Date  . ADHD (attention deficit hyperactivity disorder)   . Anxiety   . AR (allergic rhinitis)   . Arthritis   . Benign hypertension    NO MEDS  . Bipolar disorder ([place])   . Chronic back pain   . Chronic constipation   . Chronic insomnia   . COPD (chronic obstructive pulmonary disease) ([place])   . Deaf    RIGHT EAR  . Decreased dorsalis pedis pulse   . Depression   . Diabetes mellitus without complication ([place])    history of DM but no longer has  . Dyslipidemia   . Fatty liver   . GERD (gastroesophageal reflux disease)   . Hepatomegaly   . High cholesterol   . Hot flashes   . Migraine with aura   . OCD (obsessive compulsive disorder)   . Plantar warts   . Severe obesity ([place])   . Shortness of breath dyspnea   . Sleep apnea    NO CPAP  . Tobacco use   . Trochanteric bursitis of right hip     Past Surgical History:  Procedure Laterality Date  . ABDOMINAL HYSTERECTOMY N/A 10/20/2014   Procedure: Total abdominial hysterectomy, bilateral salpingo-oophorectomy;  Surgeon: Brayton Mars, MD;  Location: [facility] ORS;  Service: Gynecology;  Laterality: N/A;  . BILATERAL  SALPINGOOPHORECTOMY    . bone spurs removed Bilateral   . CARPAL TUNNEL RELEASE Left 06/08/2017   Procedure: CARPAL TUNNEL RELEASE;  Surgeon: Hessie Knows, MD;  Location: [facility] ORS;  Service: Orthopedics;  Laterality: Left;  . CARPAL TUNNEL RELEASE Right 10/10/2017   Procedure: CARPAL TUNNEL RELEASE;  Surgeon: Hessie Knows, MD;  Location: [facility] ORS;  Service: Orthopedics;  Laterality: Right;  . COLONOSCOPY WITH PROPOFOL N/A 09/01/2016   Procedure: COLONOSCOPY WITH PROPOFOL;  Surgeon: Jonathon Bellows, MD;  Location: [facility] ENDOSCOPY;  Service: Endoscopy;  Laterality: N/A;  . DORSAL COMPARTMENT RELEASE Left 06/08/2017   Procedure: RELEASE DORSAL COMPARTMENT (DEQUERVAIN);  Surgeon: Hessie Knows, MD;  Location: [facility] ORS;  Service: Orthopedics;  Laterality: Left;  . ESOPHAGOGASTRODUODENOSCOPY (EGD) WITH PROPOFOL N/A 09/01/2016   Procedure: ESOPHAGOGASTRODUODENOSCOPY (EGD) WITH PROPOFOL;  Surgeon: Jonathon Bellows, MD;  Location: [facility] ENDOSCOPY;  Service: Endoscopy;  Laterality: N/A;  . FOOT SURGERY Bilateral   . JOINT REPLACEMENT Right    Total Knee replacement X 2  . JOINT REPLACEMENT Bilateral    Total Hip Replacement  . knee arthroscopo Right   . LAPAROSCOPY  09/22/2014   Procedure: LAPAROSCOPY OPERATIVE;  Surgeon: Brayton Mars, MD;  Location: [facility] ORS;  Service: Gynecology;;  excision and fulgeration of endomertriosis  . LIPOMA EXCISION    . TONSILLECTOMY    . TOTAL HIP ARTHROPLASTY  Left 12/16/2014   Procedure: TOTAL HIP ARTHROPLASTY ANTERIOR APPROACH;  Surgeon: Hessie Knows, MD;  Location: [facility] ORS;  Service: Orthopedics;  Laterality: Left;  . TOTAL HIP ARTHROPLASTY Right 05/12/2015   Procedure: TOTAL HIP ARTHROPLASTY ANTERIOR APPROACH;  Surgeon: Hessie Knows, MD;  Location: [facility] ORS;  Service: Orthopedics;  Laterality: Right;  . TOTAL KNEE REVISION Right 09/22/2015   Procedure: TOTAL KNEE REVISION/ REVISE POLYIETHYLENE;  Surgeon: Hessie Knows, MD;  Location: [facility] ORS;  Service: Orthopedics;  Laterality:  Right;  . TOTAL KNEE REVISION Right 10/11/2016   Procedure: TOTAL KNEE REVISION;  Surgeon: Hessie Knows, MD;  Location: [facility] ORS;  Service: Orthopedics;  Laterality: Right;  . TUBAL LIGATION      Family History  Problem Relation Age of Onset  . Diabetes Mother   . Heart disease Father   . Heart attack Father   . Diabetes Sister   . Breast cancer Maternal Aunt        <50  . Breast cancer Paternal Aunt        x2.  <50  . Healthy Son   . Drug abuse Sister     Social History:  reports that she quit smoking about 6 weeks ago. Her smoking use included cigarettes. She started smoking about 38 years ago. She has a 18.50 pack-year smoking history. She has never used smokeless tobacco. She reports that she does not drink alcohol or use drugs.  Allergies:  Allergies  Allergen Reactions  . Codeine Nausea Only  . Imitrex [Sumatriptan] Other (See Comments)    Chest pain    Medications reviewed.    ROS Full ROS performed and is otherwise negative other than what is stated in HPI   BP 132/70   Pulse 74   Temp (!) 97.3 F (36.3 C) (Oral)   Ht 5\' 3"  (1.6 m)   Wt 271 lb (122.9 kg)   LMP 09/26/2014 Comment: Total  BMI 48.01 kg/m   Physical Exam  Constitutional: She is oriented to person, place, and time. She appears well-developed and well-nourished. No distress.  Neck: Normal range of motion. Neck supple. No JVD present. No tracheal deviation present. No thyromegaly present.  Cardiovascular: Normal rate, regular rhythm and normal heart sounds.  Pulmonary/Chest: Effort normal and breath sounds normal. No stridor. No respiratory distress. She has no wheezes. She has no rales.  Abdominal: Soft. She exhibits no distension and no mass. There is no rebound and no guarding. A hernia is present.  Neurological: She is alert and oriented to person, place, and time. She displays normal reflexes. No cranial nerve deficit. She exhibits normal muscle tone. Coordination normal.  Skin: Skin is  warm and dry. Capillary refill takes less than 2 seconds. She is not diaphoretic.  Psychiatric: She has a normal mood and affect. Her behavior is normal. Judgment and thought content normal.  Nursing note and vitals reviewed.     Assessment/Plan:  1. Incisional hernia, without obstruction or gangrene 52 year old female with symptomatic incisional hernia.  Discussed with the patient in detail about the operation.  Risk benefit and possible complications including but not limited to: Bleeding, infection, recurrence, bowel injury.  We will check a urine nicotine test to make sure she has been compliant with smoking cessation. I Do think that she will be a good candidate for robotic assisted incisional hernia repair.    Greater than 50% of the 15 minutes  visit was spent in counseling/coordination of care   Caroleen Hamman, MD [facility] Surgeon

## 2017-12-25 NOTE — Addendum Note (Signed)
Addended by: Chrystie Nose on: 12/25/2017 09:54 AM   Modules accepted: Orders

## 2017-12-25 NOTE — Patient Instructions (Addendum)
Patient is scheduled for incisional hernia repair tomorrow. Patient to ice the area after surgery.

## 2017-12-25 NOTE — H&P (View-Only) (Signed)
Outpatient Surgical Follow Up  12/25/2017  Wendy Johnson is an 52 y.o. female.   Chief Complaint  Patient presents with  . Follow-up    HPI: 52 year old female with a history of bipolar disorder, obesity and previous smoker that presents with a symptomatic incisional hernia after diagnostic laparoscopy for gynecological procedures.  She describes that she still has persistent pain that is moderate to severe around the periumbilical area and epigastric area.  The pain is sharp and intermittent.  She did have apparently some emesis the other day.  No evidence of obstruction or incarceration.  CT scan shows evidence of an infraumbilical hernia. She quit smoking in anticipation for her hernia repair. She does have a hx of chronic pain and is being managed by the pain clinic.  Past Medical History:  Diagnosis Date  . ADHD (attention deficit hyperactivity disorder)   . Anxiety   . AR (allergic rhinitis)   . Arthritis   . Benign hypertension    NO MEDS  . Bipolar disorder ([place])   . Chronic back pain   . Chronic constipation   . Chronic insomnia   . COPD (chronic obstructive pulmonary disease) ([place])   . Deaf    RIGHT EAR  . Decreased dorsalis pedis pulse   . Depression   . Diabetes mellitus without complication ([place])    history of DM but no longer has  . Dyslipidemia   . Fatty liver   . GERD (gastroesophageal reflux disease)   . Hepatomegaly   . High cholesterol   . Hot flashes   . Migraine with aura   . OCD (obsessive compulsive disorder)   . Plantar warts   . Severe obesity ([place])   . Shortness of breath dyspnea   . Sleep apnea    NO CPAP  . Tobacco use   . Trochanteric bursitis of right hip     Past Surgical History:  Procedure Laterality Date  . ABDOMINAL HYSTERECTOMY N/A 10/20/2014   Procedure: Total abdominial hysterectomy, bilateral salpingo-oophorectomy;  Surgeon: Brayton Mars, MD;  Location: [facility] ORS;  Service: Gynecology;  Laterality: N/A;  . BILATERAL  SALPINGOOPHORECTOMY    . bone spurs removed Bilateral   . CARPAL TUNNEL RELEASE Left 06/08/2017   Procedure: CARPAL TUNNEL RELEASE;  Surgeon: Hessie Knows, MD;  Location: [facility] OR[facility];  Service: Orthopedics;  Laterality: Left;  . CARPAL TUNNEL RELEASE Right 10/10/2017   Procedure: CARPAL TUNNEL RELEASE;  Surgeon: Hessie Knows, MD;  Location: [facility];  Service: Orthopedics;  Laterality: Right;  . COLONOSCOPY WITH PROPOFOL N/A 09/01/2016   Procedure: COLONOSCOPY WITH PROPOFOL;  Surgeon: Jonathon Bellows, MD;  Location: [facility]COPY;  Service: Endoscopy;  Laterality: N/A;  . DORSAL COMPARTMENT RELEASE Left 06/08/2017   Procedure: RELEASE DORSAL COMPARTMENT (DEQUERVAIN);  Surgeon: Hessie Knows, MD;  Location: [facility] ORS;  Service: Orthopedics;  Laterality: Left;  . ESOPHAGOGASTRODUODENOSCOPY (EGD) WITH PROPOFOL N/A 09/01/2016   Procedure: ESOPHAGOGASTRODUODENOSCOPY (EGD) WITH PROPOFOL;  Surgeon: Jonathon Bellows, MD;  Location: [facility] ENDOSCOPY;  Service: Endoscopy;  Laterality: N/A;  . FOOT SURGERY Bilateral   . JOINT REPLACEMENT Right    Total Knee replacement X 2  . JOINT REPLACEMENT Bilateral    Total Hip Replacement  . knee arthroscopo Right   . LAPAROSCOPY  09/22/2014   Procedure: LAPAROSCOPY OPERATIVE;  Surgeon: Brayton Mars, MD;  Location: [facility] ORS;  Service: Gynecology;;  excision and fulgeration of endomertriosis  . LIPOMA EXCISION    . TONSILLECTOMY    . TOTAL HIP ARTHROPLASTY  Left 12/16/2014   Procedure: TOTAL HIP ARTHROPLASTY ANTERIOR APPROACH;  Surgeon: Hessie Knows, MD;  Location: [facility] OR[facility];  Service: Orthopedics;  Laterality: Left;  . TOTAL HIP ARTHROPLASTY Right 05/12/2015   Procedure: TOTAL HIP ARTHROPLASTY ANTERIOR APPROACH;  Surgeon: Hessie Knows, MD;  Location: [facility] OR[facility];  Service: Orthopedics;  Laterality: Right;  . TOTAL KNEE REVISION Right 09/22/2015   Procedure: TOTAL KNEE REVISION/ REVISE POLYIETHYLENE;  Surgeon: Hessie Knows, MD;  Location: [facility];  Service: Orthopedics;  Laterality:  Right;  . TOTAL KNEE REVISION Right 10/11/2016   Procedure: TOTAL KNEE REVISION;  Surgeon: Hessie Knows, MD;  Location: [facility] OR[facility];  Service: Orthopedics;  Laterality: Right;  . TUBAL LIGATION      Family History  Problem Relation Age of Onset  . Diabetes Mother   . Heart disease Father   . Heart attack Father   . Diabetes Sister   . Breast cancer Maternal Aunt        <50  . Breast cancer Paternal Aunt        x2.  <50  . Healthy Son   . Drug abuse Sister     Social History:  reports that she quit smoking about 6 weeks ago. Her smoking use included cigarettes. She started smoking about 38 years ago. She has a 18.50 pack-year smoking history. She has never used smokeless tobacco. She reports that she does not drink alcohol or use drugs.  Allergies:  Allergies  Allergen Reactions  . Codeine Nausea Only  . Imitrex [Sumatriptan] Other (See Comments)    Chest pain    Medications reviewed.    ROS Full ROS performed and is otherwise negative other than what is stated in HPI   BP 132/70   Pulse 74   Temp (!) 97.3 F (36.3 C) (Oral)   Ht 5\' 3"  (1.6 m)   Wt 271 lb (122.9 kg)   LMP 09/26/2014 Comment: Total  BMI 48.01 kg/m   Physical Exam  Constitutional: She is oriented to person, place, and time. She appears well-developed and well-nourished. No distress.  Neck: Normal range of motion. Neck supple. No JVD present. No tracheal deviation present. No thyromegaly present.  Cardiovascular: Normal rate, regular rhythm and normal heart sounds.  Pulmonary/Chest: Effort normal and breath sounds normal. No stridor. No respiratory distress. She has no wheezes. She has no rales.  Abdominal: Soft. She exhibits no distension and no mass. There is no rebound and no guarding. A hernia is present.  Neurological: She is alert and oriented to person, place, and time. She displays normal reflexes. No cranial nerve deficit. She exhibits normal muscle tone. Coordination normal.  Skin: Skin is  warm and dry. Capillary refill takes less than 2 seconds. She is not diaphoretic.  Psychiatric: She has a normal mood and affect. Her behavior is normal. Judgment and thought content normal.  Nursing note and vitals reviewed.     Assessment/Plan:  1. Incisional hernia, without obstruction or gangrene 52 year old female with symptomatic incisional hernia.  Discussed with the patient in detail about the operation.  Risk benefit and possible complications including but not limited to: Bleeding, infection, recurrence, bowel injury.  We will check a urine nicotine test to make sure she has been compliant with smoking cessation. I Do think that she will be a good candidate for robotic assisted incisional hernia repair.    Greater than 50% of the 15 minutes  visit was spent in counseling/coordination of care   Caroleen Hamman, MD [place] Surgeon

## 2017-12-26 ENCOUNTER — Encounter: Payer: Self-pay | Admitting: *Deleted

## 2017-12-26 ENCOUNTER — Encounter
Admission: RE | Disposition: A | Discharge status: Home / Self Care | Payer: Self-pay | Source: Ambulatory Visit | Attending: Surgery

## 2017-12-26 ENCOUNTER — Ambulatory Visit
Admission: RE | Admit: 2017-12-26 | Discharge: 2017-12-26 | Disposition: A | Discharge status: Home / Self Care | Payer: Medicare Other | Source: Ambulatory Visit | Attending: Surgery | Admitting: Surgery

## 2017-12-26 ENCOUNTER — Other Ambulatory Visit: Payer: Self-pay

## 2017-12-26 ENCOUNTER — Ambulatory Visit: Payer: Medicare Other | Admitting: Anesthesiology

## 2017-12-26 DIAGNOSIS — G4730 Sleep apnea, unspecified: Secondary | ICD-10-CM | POA: Insufficient documentation

## 2017-12-26 DIAGNOSIS — F909 Attention-deficit hyperactivity disorder, unspecified type: Secondary | ICD-10-CM | POA: Insufficient documentation

## 2017-12-26 DIAGNOSIS — Z885 Allergy status to narcotic agent status: Secondary | ICD-10-CM | POA: Insufficient documentation

## 2017-12-26 DIAGNOSIS — K432 Incisional hernia without obstruction or gangrene: Secondary | ICD-10-CM | POA: Diagnosis present

## 2017-12-26 DIAGNOSIS — J449 Chronic obstructive pulmonary disease, unspecified: Secondary | ICD-10-CM | POA: Insufficient documentation

## 2017-12-26 DIAGNOSIS — F419 Anxiety disorder, unspecified: Secondary | ICD-10-CM | POA: Insufficient documentation

## 2017-12-26 DIAGNOSIS — Z87891 Personal history of nicotine dependence: Secondary | ICD-10-CM | POA: Diagnosis not present

## 2017-12-26 DIAGNOSIS — F5104 Psychophysiologic insomnia: Secondary | ICD-10-CM | POA: Diagnosis not present

## 2017-12-26 DIAGNOSIS — G43109 Migraine with aura, not intractable, without status migrainosus: Secondary | ICD-10-CM | POA: Diagnosis not present

## 2017-12-26 DIAGNOSIS — F429 Obsessive-compulsive disorder, unspecified: Secondary | ICD-10-CM | POA: Diagnosis not present

## 2017-12-26 DIAGNOSIS — K439 Ventral hernia without obstruction or gangrene: Secondary | ICD-10-CM | POA: Diagnosis not present

## 2017-12-26 DIAGNOSIS — K219 Gastro-esophageal reflux disease without esophagitis: Secondary | ICD-10-CM | POA: Insufficient documentation

## 2017-12-26 DIAGNOSIS — E119 Type 2 diabetes mellitus without complications: Secondary | ICD-10-CM | POA: Diagnosis not present

## 2017-12-26 DIAGNOSIS — K5909 Other constipation: Secondary | ICD-10-CM | POA: Insufficient documentation

## 2017-12-26 DIAGNOSIS — Z6841 Body Mass Index (BMI) 40.0 and over, adult: Secondary | ICD-10-CM | POA: Insufficient documentation

## 2017-12-26 DIAGNOSIS — F319 Bipolar disorder, unspecified: Secondary | ICD-10-CM | POA: Insufficient documentation

## 2017-12-26 DIAGNOSIS — E7800 Pure hypercholesterolemia, unspecified: Secondary | ICD-10-CM | POA: Diagnosis not present

## 2017-12-26 DIAGNOSIS — M1990 Unspecified osteoarthritis, unspecified site: Secondary | ICD-10-CM | POA: Diagnosis not present

## 2017-12-26 DIAGNOSIS — M549 Dorsalgia, unspecified: Secondary | ICD-10-CM | POA: Insufficient documentation

## 2017-12-26 DIAGNOSIS — G8929 Other chronic pain: Secondary | ICD-10-CM | POA: Insufficient documentation

## 2017-12-26 DIAGNOSIS — I10 Essential (primary) hypertension: Secondary | ICD-10-CM | POA: Diagnosis not present

## 2017-12-26 HISTORY — PX: INSERTION OF MESH: SHX5868

## 2017-12-26 HISTORY — PX: ROBOTIC ASSISTED LAPAROSCOPIC VENTRAL/INCISIONAL HERNIA REPAIR: SHX6607

## 2017-12-26 LAB — URINE DRUG SCREEN, QUALITATIVE (ARMC ONLY)
Amphetamines, Ur Screen: NOT DETECTED
Barbiturates, Ur Screen: NOT DETECTED
CANNABINOID 50 NG, UR ~~LOC~~: NOT DETECTED
Cocaine Metabolite,Ur ~~LOC~~: NOT DETECTED
MDMA (ECSTASY) UR SCREEN: NOT DETECTED
Methadone Scn, Ur: NOT DETECTED
OPIATE, UR SCREEN: POSITIVE — AB
PHENCYCLIDINE (PCP) UR S: NOT DETECTED
Tricyclic, Ur Screen: NOT DETECTED

## 2017-12-26 LAB — GLUCOSE, CAPILLARY: GLUCOSE-CAPILLARY: 145 mg/dL — AB (ref 70–99)

## 2017-12-26 SURGERY — ROBOTIC ASSISTED LAPAROSCOPIC VENTRAL/INCISIONAL HERNIA REPAIR
Anesthesia: General | Wound class: "Clean "

## 2017-12-26 MED ORDER — SUCCINYLCHOLINE CHLORIDE 20 MG/ML IJ SOLN
INTRAMUSCULAR | Status: DC | PRN
  Administered 2017-12-26: 140 mg via INTRAVENOUS

## 2017-12-26 MED ORDER — PROPOFOL 10 MG/ML IV BOLUS
INTRAVENOUS | Status: AC
  Filled 2017-12-26: qty 20

## 2017-12-26 MED ORDER — OXYCODONE HCL 5 MG PO TABS
ORAL_TABLET | ORAL | Status: AC
  Filled 2017-12-26: qty 2

## 2017-12-26 MED ORDER — DEXAMETHASONE SODIUM PHOSPHATE 10 MG/ML IJ SOLN
INTRAMUSCULAR | Status: AC
  Filled 2017-12-26: qty 1

## 2017-12-26 MED ORDER — GABAPENTIN 400 MG PO CAPS: 400.0000 mg | ORAL_CAPSULE | Freq: Four times a day (QID) | ORAL | 2 refills | Status: DC

## 2017-12-26 MED ORDER — LIDOCAINE HCL (CARDIAC) PF 100 MG/5ML IV SOSY
PREFILLED_SYRINGE | INTRAVENOUS | Status: DC | PRN
  Administered 2017-12-26: 40 mg via INTRAVENOUS

## 2017-12-26 MED ORDER — ACETAMINOPHEN 500 MG PO TABS
ORAL_TABLET | ORAL | Status: AC
  Filled 2017-12-26: qty 2

## 2017-12-26 MED ORDER — ONDANSETRON HCL 4 MG/2ML IJ SOLN: 4.0000 mg | Freq: Once | INTRAMUSCULAR | Status: DC | PRN

## 2017-12-26 MED ORDER — ROCURONIUM BROMIDE 50 MG/5ML IV SOLN
INTRAVENOUS | Status: AC
  Filled 2017-12-26: qty 1

## 2017-12-26 MED ORDER — LABETALOL HCL 5 MG/ML IV SOLN
10.0000 mg | Freq: Once | INTRAVENOUS | Status: AC
  Administered 2017-12-26: 10 mg via INTRAVENOUS

## 2017-12-26 MED ORDER — ACETAMINOPHEN 325 MG PO TABS
325.0000 mg | ORAL_TABLET | Freq: Once | ORAL | Status: AC
  Administered 2017-12-26: 325 mg via ORAL

## 2017-12-26 MED ORDER — GLYCOPYRROLATE 0.2 MG/ML IJ SOLN
INTRAMUSCULAR | Status: DC | PRN
  Administered 2017-12-26 (×2): 0.1 mg via INTRAVENOUS

## 2017-12-26 MED ORDER — KETAMINE HCL 50 MG/ML IJ SOLN
INTRAMUSCULAR | Status: AC
  Filled 2017-12-26: qty 10

## 2017-12-26 MED ORDER — PROPOFOL 10 MG/ML IV BOLUS
INTRAVENOUS | Status: DC | PRN
  Administered 2017-12-26: 150 mg via INTRAVENOUS
  Administered 2017-12-26: 30 mg via INTRAVENOUS

## 2017-12-26 MED ORDER — DEXAMETHASONE SODIUM PHOSPHATE 10 MG/ML IJ SOLN
INTRAMUSCULAR | Status: DC | PRN
  Administered 2017-12-26: 5 mg via INTRAVENOUS

## 2017-12-26 MED ORDER — ROCURONIUM BROMIDE 100 MG/10ML IV SOLN
INTRAVENOUS | Status: DC | PRN
  Administered 2017-12-26 (×4): 10 mg via INTRAVENOUS
  Administered 2017-12-26 (×2): 20 mg via INTRAVENOUS
  Administered 2017-12-26 (×2): 10 mg via INTRAVENOUS
  Administered 2017-12-26: 40 mg via INTRAVENOUS
  Administered 2017-12-26: 10 mg via INTRAVENOUS
  Administered 2017-12-26: 50 mg via INTRAVENOUS

## 2017-12-26 MED ORDER — HEPARIN SODIUM (PORCINE) 5000 UNIT/ML IJ SOLN
INTRAMUSCULAR | Status: AC
  Filled 2017-12-26: qty 1

## 2017-12-26 MED ORDER — PHENYLEPHRINE HCL 10 MG/ML IJ SOLN
INTRAMUSCULAR | Status: AC
  Filled 2017-12-26: qty 1

## 2017-12-26 MED ORDER — ONDANSETRON HCL 4 MG/2ML IJ SOLN
INTRAMUSCULAR | Status: AC
  Filled 2017-12-26: qty 2

## 2017-12-26 MED ORDER — CELECOXIB 200 MG PO CAPS: 200.0000 mg | ORAL_CAPSULE | ORAL | Status: DC

## 2017-12-26 MED ORDER — OXYCODONE-ACETAMINOPHEN 10-325 MG PO TABS: 1.0000 | ORAL_TABLET | ORAL | 0 refills | Status: DC | PRN

## 2017-12-26 MED ORDER — CHLORHEXIDINE GLUCONATE CLOTH 2 % EX PADS: 6.0000 | MEDICATED_PAD | Freq: Once | CUTANEOUS | Status: DC

## 2017-12-26 MED ORDER — FENTANYL CITRATE (PF) 100 MCG/2ML IJ SOLN
25.0000 ug | INTRAMUSCULAR | Status: DC | PRN
  Administered 2017-12-26 (×2): 25 ug via INTRAVENOUS
  Administered 2017-12-26: 50 ug via INTRAVENOUS

## 2017-12-26 MED ORDER — LACTATED RINGERS IV SOLN
INTRAVENOUS | Status: DC
  Administered 2017-12-26 (×2): via INTRAVENOUS

## 2017-12-26 MED ORDER — FENTANYL CITRATE (PF) 250 MCG/5ML IJ SOLN
INTRAMUSCULAR | Status: AC
  Filled 2017-12-26: qty 5

## 2017-12-26 MED ORDER — KETAMINE HCL 50 MG/ML IJ SOLN
INTRAMUSCULAR | Status: DC | PRN
  Administered 2017-12-26 (×2): 10 mg via INTRAMUSCULAR
  Administered 2017-12-26: 50 mg via INTRAMUSCULAR
  Administered 2017-12-26: 10 mg via INTRAMUSCULAR

## 2017-12-26 MED ORDER — METHYLPREDNISOLONE SODIUM SUCC 125 MG IJ SOLR
INTRAMUSCULAR | Status: AC
  Filled 2017-12-26: qty 2

## 2017-12-26 MED ORDER — BUPIVACAINE HCL (PF) 0.25 % IJ SOLN
INTRAMUSCULAR | Status: AC
  Filled 2017-12-26: qty 30

## 2017-12-26 MED ORDER — LABETALOL HCL 5 MG/ML IV SOLN
INTRAVENOUS | Status: AC
  Administered 2017-12-26: 10 mg via INTRAVENOUS
  Filled 2017-12-26: qty 4

## 2017-12-26 MED ORDER — ACETAMINOPHEN 325 MG PO TABS
ORAL_TABLET | ORAL | Status: AC
  Administered 2017-12-26: 325 mg via ORAL
  Filled 2017-12-26: qty 1

## 2017-12-26 MED ORDER — BUPIVACAINE-EPINEPHRINE (PF) 0.25% -1:200000 IJ SOLN
INTRAMUSCULAR | Status: AC
  Filled 2017-12-26: qty 30

## 2017-12-26 MED ORDER — DIPHENHYDRAMINE HCL 50 MG/ML IJ SOLN
INTRAMUSCULAR | Status: AC
  Filled 2017-12-26: qty 1

## 2017-12-26 MED ORDER — ONDANSETRON HCL 4 MG/2ML IJ SOLN
INTRAMUSCULAR | Status: DC | PRN
  Administered 2017-12-26: 4 mg via INTRAVENOUS

## 2017-12-26 MED ORDER — BUPIVACAINE-EPINEPHRINE (PF) 0.25% -1:200000 IJ SOLN
INTRAMUSCULAR | Status: DC | PRN
  Administered 2017-12-26: 30 mL via PERINEURAL

## 2017-12-26 MED ORDER — MIDAZOLAM HCL 2 MG/2ML IJ SOLN
INTRAMUSCULAR | Status: DC | PRN
  Administered 2017-12-26: 2 mg via INTRAVENOUS

## 2017-12-26 MED ORDER — CELECOXIB 200 MG PO CAPS
ORAL_CAPSULE | ORAL | Status: AC
  Filled 2017-12-26: qty 1

## 2017-12-26 MED ORDER — MIDAZOLAM HCL 2 MG/2ML IJ SOLN
INTRAMUSCULAR | Status: AC
  Filled 2017-12-26: qty 2

## 2017-12-26 MED ORDER — DEXMEDETOMIDINE HCL IN NACL 80 MCG/20ML IV SOLN
INTRAVENOUS | Status: AC
  Filled 2017-12-26: qty 20

## 2017-12-26 MED ORDER — LACTATED RINGERS IV SOLN
INTRAVENOUS | Status: DC | PRN
  Administered 2017-12-26: 08:00:00 via INTRAVENOUS

## 2017-12-26 MED ORDER — SUGAMMADEX SODIUM 200 MG/2ML IV SOLN
INTRAVENOUS | Status: AC
  Filled 2017-12-26: qty 4

## 2017-12-26 MED ORDER — GABAPENTIN 300 MG PO CAPS
300.0000 mg | ORAL_CAPSULE | ORAL | Status: AC
  Administered 2017-12-26: 300 mg via ORAL

## 2017-12-26 MED ORDER — FENTANYL CITRATE (PF) 100 MCG/2ML IJ SOLN
INTRAMUSCULAR | Status: DC | PRN
  Administered 2017-12-26 (×7): 50 ug via INTRAVENOUS

## 2017-12-26 MED ORDER — GABAPENTIN 300 MG PO CAPS
ORAL_CAPSULE | ORAL | Status: AC
  Filled 2017-12-26: qty 1

## 2017-12-26 MED ORDER — OXYCODONE HCL 5 MG PO TABS
10.0000 mg | ORAL_TABLET | Freq: Once | ORAL | Status: AC
  Administered 2017-12-26: 10 mg via ORAL

## 2017-12-26 MED ORDER — SUGAMMADEX SODIUM 200 MG/2ML IV SOLN
INTRAVENOUS | Status: DC | PRN
  Administered 2017-12-26: 400 mg via INTRAVENOUS

## 2017-12-26 MED ORDER — FENTANYL CITRATE (PF) 100 MCG/2ML IJ SOLN
INTRAMUSCULAR | Status: AC
  Filled 2017-12-26: qty 2

## 2017-12-26 MED ORDER — ACETAMINOPHEN 500 MG PO TABS
1000.0000 mg | ORAL_TABLET | ORAL | Status: AC
  Administered 2017-12-26: 1000 mg via ORAL

## 2017-12-26 MED ORDER — DIPHENHYDRAMINE HCL 50 MG/ML IJ SOLN
INTRAMUSCULAR | Status: DC | PRN
  Administered 2017-12-26: 12.5 mg via INTRAVENOUS

## 2017-12-26 MED ORDER — FENTANYL CITRATE (PF) 100 MCG/2ML IJ SOLN
INTRAMUSCULAR | Status: AC
  Administered 2017-12-26: 25 ug via INTRAVENOUS
  Filled 2017-12-26: qty 2

## 2017-12-26 MED ORDER — SUCCINYLCHOLINE CHLORIDE 20 MG/ML IJ SOLN
INTRAMUSCULAR | Status: AC
  Filled 2017-12-26: qty 1

## 2017-12-26 MED ORDER — LIDOCAINE HCL (PF) 2 % IJ SOLN
INTRAMUSCULAR | Status: AC
  Filled 2017-12-26: qty 10

## 2017-12-26 MED ORDER — PROPOFOL 500 MG/50ML IV EMUL
INTRAVENOUS | Status: DC | PRN
  Administered 2017-12-26: 20 ug/kg/min via INTRAVENOUS

## 2017-12-26 MED ORDER — GLYCOPYRROLATE 0.2 MG/ML IJ SOLN
INTRAMUSCULAR | Status: AC
  Filled 2017-12-26: qty 1

## 2017-12-26 MED ORDER — HEPARIN SODIUM (PORCINE) 5000 UNIT/ML IJ SOLN
5000.0000 [IU] | Freq: Once | INTRAMUSCULAR | Status: AC
  Administered 2017-12-26: 5000 [IU] via SUBCUTANEOUS

## 2017-12-26 MED ORDER — BUPIVACAINE LIPOSOME 1.3 % IJ SUSP: 20.0000 mL | Freq: Once | INTRAMUSCULAR | Status: DC

## 2017-12-26 SURGICAL SUPPLY — 46 items
CANISTER SUCT 1200ML W/VALVE (MISCELLANEOUS) ×2 IMPLANT
CANNULA SEALS 8.5MM (CANNULA) ×1
CHLORAPREP W/TINT 26ML (MISCELLANEOUS) ×3 IMPLANT
CORD BIP STRL DISP 12FT (MISCELLANEOUS) ×2 IMPLANT
DEFOGGER SCOPE WARMER CLEARIFY (MISCELLANEOUS) ×2 IMPLANT
DERMABOND ADVANCED (GAUZE/BANDAGES/DRESSINGS) ×1
DERMABOND ADVANCED .7 DNX12 (GAUZE/BANDAGES/DRESSINGS) IMPLANT
DRAPE 3 ARM ACCESS DA VINCI (DRAPES) ×1
DRAPE 3 ARM ACCESS DVNC (DRAPES) ×1 IMPLANT
DRAPE SHEET LG 3/4 BI-LAMINATE (DRAPES) ×4 IMPLANT
ELECT REM PT RETURN 9FT ADLT (ELECTROSURGICAL) ×2
ELECTRODE REM PT RTRN 9FT ADLT (ELECTROSURGICAL) ×1 IMPLANT
GLOVE BIO SURGEON STRL SZ7 (GLOVE) ×8 IMPLANT
GOWN STRL REUS W/ TWL LRG LVL3 (GOWN DISPOSABLE) ×3 IMPLANT
GOWN STRL REUS W/TWL LRG LVL3 (GOWN DISPOSABLE) ×3
GRASPER SUT TROCAR 14GX15 (MISCELLANEOUS) ×2 IMPLANT
IV NS 1000ML (IV SOLUTION)
IV NS 1000ML BAXH (IV SOLUTION) ×1 IMPLANT
KIT PINK PAD W/HEAD ARE REST (MISCELLANEOUS) ×2
KIT PINK PAD W/HEAD ARM REST (MISCELLANEOUS) ×1 IMPLANT
LABEL OR SOLS (LABEL) ×2 IMPLANT
MESH VENTRALIGHT ST 6X8 (Mesh Specialty) ×1 IMPLANT
MESH VENTRLGHT ELLIPSE 8X6XMFL (Mesh Specialty) IMPLANT
NEEDLE HYPO 22GX1.5 SAFETY (NEEDLE) ×2 IMPLANT
PACK LAP CHOLECYSTECTOMY (MISCELLANEOUS) ×2 IMPLANT
PROGRASP ENDOWRIST DA VINCI (INSTRUMENTS) ×1
PROGRASP ENDOWRIST DVNC (INSTRUMENTS) ×1 IMPLANT
SEAL CANN 8.5 DVNC (CANNULA) ×1 IMPLANT
SLEEVE ADV FIXATION 5X100MM (TROCAR) ×2 IMPLANT
SOLUTION ELECTROLUBE (MISCELLANEOUS) ×2 IMPLANT
SPONGE LAP 18X18 5 PK (GAUZE/BANDAGES/DRESSINGS) ×2 IMPLANT
STRAP SAFETY 5IN WIDE (MISCELLANEOUS) ×1 IMPLANT
SUT DVC VLOC 180 0 12IN GS21 (SUTURE) ×6
SUT MNCRL 4-0 (SUTURE) ×1
SUT MNCRL 4-0 27XMFL (SUTURE) ×1
SUT VIC AB 0 CT1 36 (SUTURE) ×1 IMPLANT
SUT VIC AB 0 CT2 27 (SUTURE) ×2 IMPLANT
SUT VICRYL 0 AB UR-6 (SUTURE) ×2 IMPLANT
SUT VLOC 180 0 6IN GS21 (SUTURE) ×1 IMPLANT
SUT VLOC 90 2/L VL 12 GS22 (SUTURE) ×6 IMPLANT
SUTURE DVC VLC 180 0 12IN GS21 (SUTURE) IMPLANT
SUTURE MNCRL 4-0 27XMF (SUTURE) ×1 IMPLANT
SYR 3ML LL SCALE MARK (SYRINGE) ×2 IMPLANT
TROCAR 130MM GELPORT  DAV (MISCELLANEOUS) ×2 IMPLANT
TROCAR XCEL NON-BLD 5MMX100MML (ENDOMECHANICALS) ×2 IMPLANT
TUBING INSUF HEATED (TUBING) ×2 IMPLANT

## 2017-12-26 NOTE — Op Note (Signed)
Robotic assisted Laparoscopic Ventral Hernia Repair with Mesh ( 20x15 cms)  Pre-operative Diagnosis: Symptomatic Ventral Hernia                                               Morbid Obesity  Post-operative Diagnosis: Same  Surgeon: Caroleen Hamman, MD FACS  Anesthesia: Gen. with endotracheal tube   Findings: Ventral Hernia measuring 6 cm  Estimated Blood Loss: 20cc              Complications: none              Procedure Details  The patient was seen again in the Holding Room. The benefits, complications, treatment options, and expected outcomes were discussed with the patient. The risks of bleeding, infection, recurrence of symptoms, failure to resolve symptoms, bowel injury, mesh placement, mesh infection, any of which could require further surgery were reviewed with the patient. The likelihood of improving the patient's symptoms with return to their baseline status is good.  The patient and/or family concurred with the proposed plan, giving informed consent.  The patient was taken to Operating Room, identified as Wendy Johnson and the procedure verified.  A Time Out was held and the above information confirmed.  Prior to the induction of general anesthesia, antibiotic prophylaxis was administered. VTE prophylaxis was in place. General endotracheal anesthesia was then administered and tolerated well. After the induction, the abdomen was prepped with Chloraprep and draped in the sterile fashion. The patient was positioned in the supine position.  Left subcostal incision was created and using an open cutdown technique were able to elevate the fascia from the anterior and posterior sheath and incising with a 15 blade knife.  A 2 stay sutures were placed on each side of the fascia and a Hassan trocar was placed.  Pneumoperitoneum was obtained in the standard fashion.  Laparoscopy revealed evidence of a route 6 cm hernia defect.  2 8 mm robotic ports were placed under direct visualization within  the left lower quadrant. Measured defect and we chose a 15 x 20 cm ventral light mesh by BARD.    The Robot was brought  into the surgical field and docked in the standard fashion.  There was no evidence of collision and we maintained all the instruments under direct visualization at all times. Using a 0V lock suture were able to close the defect in the standard fashion.  The hernia sac was visualized and was kept intact.  No evidence of incarceration or significant adhesions. We placed 0 vicryl stitch in the middle of the mesh and using a suture passer were able to bring it towards abdominal wall to center the mesh within the defect.   The mesh was placed in an underlay fashion using 2 O V lock stitch we anchored the mesh circumferentially to the abdominal wall. The mesh lay really nicely again significant abdominal wall. A second look laparoscopy revealed no evidence of intra-abdominal injury.  The robot arms were undocked in the standard fashion. The laparoscopic Robotic ports were removed under direct visualization and the pneumoperitoneum was deflated. Fascia for 12 port was closed with 0 vicryls. Incisions were closed in a 2 layer fashion with 3-0 Vicryl and 4-0 Monocryl. Dermabond was used to coat the skin. Marcaine quarter percent with epinephrine and lidocaine 1% was used to inject all the incision sites. Patient tolerated  procedure well and there were no immediate complications. Needle and laparotomy counts were correct   Caroleen Hamman, MD, FACS

## 2017-12-26 NOTE — Anesthesia Post-op Follow-up Note (Signed)
Anesthesia QCDR form completed.        

## 2017-12-26 NOTE — Progress Notes (Signed)
PAIN level pt states 10 out of 10   Visual 7 out of 10

## 2017-12-26 NOTE — Progress Notes (Signed)
Blood pressure 162/96  This is fine with dr Kayleen Memos

## 2017-12-26 NOTE — Progress Notes (Signed)
Blood pressure 164/104 labetalol given

## 2017-12-26 NOTE — Anesthesia Procedure Notes (Addendum)
Procedure Name: Intubation Date/Time: 12/26/2017 7:45 AM Performed by: Marin Shutter, RN Pre-anesthesia Checklist: Patient identified, Emergency Drugs available, Suction available, Patient being monitored and Timeout performed Patient Re-evaluated:Patient Re-evaluated prior to induction Oxygen Delivery Method: Circle system utilized Preoxygenation: Pre-oxygenation with 100% oxygen Induction Type: IV induction Ventilation: Mask ventilation without difficulty and Oral airway inserted - appropriate to patient size Laryngoscope Size: McGraph and 3 Grade View: Grade I Tube type: Oral Tube size: 7.5 mm Number of attempts: 1 Airway Equipment and Method: Stylet,  Video-laryngoscopy,  Oral airway and LTA kit utilized Placement Confirmation: ETT inserted through vocal cords under direct vision,  positive ETCO2 and breath sounds checked- equal and bilateral Secured at: 21 cm Tube secured with: Tape Dental Injury: Teeth and Oropharynx as per pre-operative assessment

## 2017-12-26 NOTE — Interval H&P Note (Signed)
History and Physical Interval Note:  12/26/2017 7:22 AM  Wendy Johnson  has presented today for surgery, with the diagnosis of ventral hernia  The various methods of treatment have been discussed with the patient and family. After consideration of risks, benefits and other options for treatment, the patient has consented to  Procedure(s): ROBOTIC ASSISTED LAPAROSCOPIC VENTRAL/INCISIONAL HERNIA REPAIR (N/A) INSERTION OF MESH (N/A) as a surgical intervention .  The patient's history has been reviewed, patient examined, no change in status, stable for surgery.  I have reviewed the patient's chart and labs.  Questions were answered to the patient's satisfaction.     [place]

## 2017-12-26 NOTE — Progress Notes (Signed)
Another dose of labetalol given for blood pressure

## 2017-12-26 NOTE — Anesthesia Preprocedure Evaluation (Signed)
Anesthesia Evaluation  Patient identified by MRN, date of birth, ID band Patient awake    Reviewed: Allergy & Precautions, NPO status , Patient's Chart, lab work & pertinent test results  History of Anesthesia Complications Negative for: history of anesthetic complications  Airway Mallampati: III  TM Distance: >3 FB Neck ROM: Full    Dental no notable dental hx.    Pulmonary shortness of breath, sleep apnea , COPD,  COPD inhaler, Current Smoker, former smoker,  Smokes 1ppd   Pulmonary exam normal breath sounds clear to auscultation       Cardiovascular hypertension, Pt. on medications Normal cardiovascular exam Rhythm:Regular Rate:Normal     Neuro/Psych  Headaches, PSYCHIATRIC DISORDERS Anxiety Depression Bipolar Disorder  Neuromuscular disease    GI/Hepatic Neg liver ROS, GERD  Medicated and Controlled,  Endo/Other  negative endocrine ROSdiabetes  Renal/GU negative Renal ROS  negative genitourinary   Musculoskeletal  (+) Arthritis ,   Abdominal   Peds negative pediatric ROS (+)  Hematology negative hematology ROS (+)   Anesthesia Other Findings   Reproductive/Obstetrics negative OB ROS                             Anesthesia Physical  Anesthesia Plan  ASA: III  Anesthesia Plan: General   Post-op Pain Management:    Induction: Intravenous, Rapid sequence and Cricoid pressure planned  PONV Risk Score and Plan:   Airway Management Planned: Oral ETT  Additional Equipment:   Intra-op Plan:   Post-operative Plan: Extubation in OR  Informed Consent: I have reviewed the patients History and Physical, chart, labs and discussed the procedure including the risks, benefits and alternatives for the proposed anesthesia with the patient or authorized representative who has indicated his/her understanding and acceptance.   Dental advisory given  Plan Discussed with: CRNA and  Surgeon  Anesthesia Plan Comments:         Anesthesia Quick Evaluation

## 2017-12-26 NOTE — Discharge Instructions (Addendum)
Laparoscopic Ventral Hernia Repair, Care After This sheet gives you information about how to care for yourself after your procedure. Your health care provider may also give you more specific instructions. If you have problems or questions, contact your health care provider. What can I expect after the procedure? After the procedure, it is common to have:  Pain, discomfort, or soreness.  Follow these instructions at home: Incision care  Follow instructions from your health care provider about how to take care of your incision. Make sure you: ? Wash your hands with soap and water before you change your bandage (dressing) or before you touch your abdomen. If soap and water are not available, use hand sanitizer. ? Change your dressing as told by your health care provider. ? Leave stitches (sutures), skin glue, or adhesive strips in place. These skin closures may need to stay in place for 2 weeks or longer. If adhesive strip edges start to loosen and curl up, you may trim the loose edges. Do not remove adhesive strips completely unless your health care provider tells you to do that.    Check your incision area every day for signs of infection. Check for: ? Redness, swelling, or pain. ? Fluid or blood. ? Warmth. ? Pus or a bad smell. Bathing  Do not take baths, swim, or use a hot tub until your health care provider approves. Ask your health care provider if you can take showers. You may only be allowed to take sponge baths for bathing.    Keep your bandage (dressing) dry until your health care provider says it can be removed. Activity  Do not lift anything that is heavier than 10 lb (4.5 kg) until your health care provider approves.  Do not drive or use heavy machinery while taking prescription pain medicine. Ask your health care provider when it is safe for you to drive or use heavy machinery.  Do not drive for 24 hours if you were given a medicine to help you relax (sedative) during  your procedure.  Rest as told by your health care provider. You may return to your normal activities when your health care provider approves. General instructions  Take over-the-counter and prescription medicines only as told by your health care provider.  To prevent or treat constipation while you are taking prescription pain medicine, your health care provider may recommend that you: ? Take over-the-counter or prescription medicines. ? Eat foods that are high in fiber, such as fresh fruits and vegetables, whole grains, and beans. ? Limit foods that are high in fat and processed sugars, such as fried and sweet foods.  Drink enough fluid to keep your urine clear or pale yellow.  Hold a pillow over your abdomen when you cough or sneeze. This helps with pain.  Keep all follow-up visits as told by your health care provider. This is important. Contact a health care provider if:  You have: ? A fever or chills. ? Redness, swelling, or pain around your incision. ? Fluid or blood coming from your incision. ? Pus or a bad smell coming from your incision. ? Pain that gets worse or does not get better with medicine. ? Nausea or vomiting. ? A cough. ? Shortness of breath.  Your incision feels warm to the touch.  You have not had a bowel movement in three days.  You are not able to urinate. Get help right away if:  You have severe pain in your abdomen.  You have persistent nausea and vomiting.  You have redness, warmth, or pain in your leg.  You have chest pain.  You have trouble breathing. Summary  After this procedure, it is common to have pain, discomfort, or soreness.  Follow instructions from your health care provider about how to take care of your incision.  Check your incision area every day for signs of infection. Report any signs of infection to your health care provider.  Keep all follow-up visits as told by your health care provider. This is important. This  information is not intended to replace advice given to you by your health care provider. Make sure you discuss any questions you have with your health care provider. Document Released: 04/11/2012 Document Revised: 12/16/2015 Document Reviewed: 12/16/2015 Elsevier Interactive Patient Education  2018 [place]   1) The drugs that you were given will stay in your system until tomorrow so for the next 24 hours you should not:  A) Drive an automobile B) Make any legal decisions C) Drink any alcoholic beverage   2) You may resume regular meals tomorrow.  Today it is better to start with liquids and gradually work up to solid foods.  You may eat anything you prefer, but it is better to start with liquids, then soup and crackers, and gradually work up to solid foods.   3) Please notify your doctor immediately if you have any unusual bleeding, trouble breathing, redness and pain at the surgery site, drainage, fever, or pain not relieved by medication.    4) Additional Instructions:   Please contact your physician with any problems or Same Day Surgery at [phone], Monday through Friday 6 am to 4 pm, or [place] at [facility] number at [phone].

## 2017-12-26 NOTE — Transfer of Care (Signed)
Immediate Anesthesia Transfer of Care Note  Patient: Wendy Johnson  Procedure(s) Performed: ROBOTIC ASSISTED LAPAROSCOPIC VENTRAL/INCISIONAL HERNIA REPAIR (N/A ) INSERTION OF MESH (N/A )  Patient Location: PACU  Anesthesia Type:General  Level of Consciousness: oriented, drowsy and patient cooperative  Airway & Oxygen Therapy: Patient Spontanous Breathing, Patient connected to face mask oxygen and Patient connected to face mask  Post-op Assessment: Post -op Vital signs reviewed and stable and Patient moving all extremities  Post vital signs: Reviewed and stable  Last Vitals:  Vitals Value Taken Time  BP 124/105 12/26/2017 12:17 PM  Temp    Pulse 102 12/26/2017 12:23 PM  Resp 23 12/26/2017 12:23 PM  SpO2 95 % 12/26/2017 12:23 PM  Vitals shown include unvalidated device data.  Last Pain:  Vitals:   12/26/17 0608  TempSrc: Temporal  PainSc: 6          Complications: No apparent anesthesia complications

## 2017-12-27 ENCOUNTER — Telehealth: Payer: Self-pay | Admitting: Surgery

## 2017-12-27 ENCOUNTER — Ambulatory Visit: Payer: Self-pay | Admitting: Surgery

## 2017-12-27 DIAGNOSIS — G4733 Obstructive sleep apnea (adult) (pediatric): Secondary | ICD-10-CM | POA: Diagnosis not present

## 2017-12-27 DIAGNOSIS — J449 Chronic obstructive pulmonary disease, unspecified: Secondary | ICD-10-CM | POA: Diagnosis not present

## 2017-12-27 LAB — MISC LABCORP TEST (SEND OUT): Labcorp test code: 716555

## 2017-12-27 MED ORDER — CYCLOBENZAPRINE HCL 5 MG PO TABS: 5.0000 mg | ORAL_TABLET | Freq: Three times a day (TID) | ORAL | 0 refills | Status: DC | PRN

## 2017-12-27 NOTE — Telephone Encounter (Signed)
Called patient back and she stated that she is in a lot of pain and that the medication that he prescribed for pain has not helped her at all. I asked if she had applied ice on her incisions and she stated that she had and it had not helped. I placed patient on hold and asked Dr. Dahlia Byes for recommendations. He stated that he would recommend for her to start taking Flexeril 5 MG every 8 hours as needed. If this would not help, then she is to go to the ED. I then came back to the phone and told the patient what was recommended and she stated that she already had flexeril at home and that it was not helping her either. Therefore, I told her that she needed to go to the ED so they could help her control her pain. She stated that she did not want to go to the ED because she was afraid of needles. I told her that it was the other option if her pain would not get under control. She then asked for me to go ahead and send the prescription to her pharmacy since she only had two tablets left. I told her that I would. Patient had no further questions.

## 2017-12-27 NOTE — Telephone Encounter (Signed)
Patient is calling and is complaining of being in a lot of pain, and is asking for some pain medication or something stronger for her. Please call patient and advise.

## 2017-12-28 NOTE — Anesthesia Postprocedure Evaluation (Signed)
Anesthesia Post Note  Patient: Wendy Johnson  Procedure(s) Performed: ROBOTIC ASSISTED LAPAROSCOPIC VENTRAL/INCISIONAL HERNIA REPAIR (N/A ) INSERTION OF MESH (N/A )  Patient location during evaluation: PACU Anesthesia Type: General Level of consciousness: awake and alert and oriented Pain management: pain level controlled Vital Signs Assessment: post-procedure vital signs reviewed and stable Respiratory status: spontaneous breathing Cardiovascular status: blood pressure returned to baseline Anesthetic complications: no     Last Vitals:  Vitals:   12/26/17 1400 12/26/17 1449  BP: (!) 157/84 133/72  Pulse: 83 81  Resp: 16 16  Temp: (!) 36.1 C   SpO2: 92% 97%    Last Pain:  Vitals:   12/27/17 0835  TempSrc:   PainSc: 4                  Andon Villard

## 2018-01-01 ENCOUNTER — Encounter: Payer: Self-pay | Admitting: Surgery

## 2018-01-01 ENCOUNTER — Ambulatory Visit (INDEPENDENT_AMBULATORY_CARE_PROVIDER_SITE_OTHER): Payer: Medicare Other | Admitting: Surgery

## 2018-01-01 ENCOUNTER — Telehealth: Payer: Self-pay | Admitting: *Deleted

## 2018-01-01 VITALS — BP 142/80 | HR 72 | Temp 97.3°F | Resp 13 | Ht 67.0 in | Wt 270.0 lb

## 2018-01-01 DIAGNOSIS — Z09 Encounter for follow-up examination after completed treatment for conditions other than malignant neoplasm: Secondary | ICD-10-CM

## 2018-01-01 MED ORDER — OXYCODONE-ACETAMINOPHEN 10-325 MG PO TABS: 1.0000 | ORAL_TABLET | Freq: Four times a day (QID) | ORAL | 0 refills | Status: DC | PRN

## 2018-01-01 NOTE — Telephone Encounter (Signed)
Spoke with patient.  States she had been hurting really bad and wanted Dr Andree Elk to call pharmacy and have them fill her pain meds earlier.  Informed patient that whoever did her surgery should have prescribed her pain medication for that surgery.  Patient states she was given 30 additional pills for post surgical pain.  Informed patient that no, per clinic policy, we would not call and have an early fill on her medications.

## 2018-01-01 NOTE — Patient Instructions (Signed)
Patient to return as needed. The patient is aware to call back for any questions or concerns. 

## 2018-01-02 ENCOUNTER — Other Ambulatory Visit: Payer: Self-pay

## 2018-01-02 DIAGNOSIS — K219 Gastro-esophageal reflux disease without esophagitis: Secondary | ICD-10-CM

## 2018-01-02 MED ORDER — DEXLANSOPRAZOLE 60 MG PO CPDR: 60.0000 mg | DELAYED_RELEASE_CAPSULE | Freq: Every day | ORAL | 0 refills | Status: DC

## 2018-01-02 NOTE — Telephone Encounter (Signed)
Needs follow up, sending 30 days only

## 2018-01-02 NOTE — Telephone Encounter (Signed)
Refill request for general medication. Dexilant to [place] office visit: 07/19/2017   Follow up with Nurse Health Advisor on 07/10/2018

## 2018-01-03 ENCOUNTER — Encounter: Payer: Self-pay | Admitting: Surgery

## 2018-01-03 NOTE — Progress Notes (Signed)
S/p robotic ventral hernia She c/o of some pain and wishes to have a refill +PO, no fevers .  PE NAD Abd: soft, minimal incisions tenderness, no recurrence or infection  A/p Doing well Refill oxycodone last time RTC prn No complications

## 2018-01-22 ENCOUNTER — Ambulatory Visit (INDEPENDENT_AMBULATORY_CARE_PROVIDER_SITE_OTHER): Payer: Medicare Other | Admitting: Family Medicine

## 2018-01-22 ENCOUNTER — Encounter: Payer: Self-pay | Admitting: Family Medicine

## 2018-01-22 VITALS — BP 132/84 | HR 115 | Temp 97.6°F | Resp 18 | Ht 67.0 in | Wt 266.8 lb

## 2018-01-22 DIAGNOSIS — Z79899 Other long term (current) drug therapy: Secondary | ICD-10-CM

## 2018-01-22 DIAGNOSIS — K219 Gastro-esophageal reflux disease without esophagitis: Secondary | ICD-10-CM

## 2018-01-22 DIAGNOSIS — F1021 Alcohol dependence, in remission: Secondary | ICD-10-CM

## 2018-01-22 DIAGNOSIS — E785 Hyperlipidemia, unspecified: Secondary | ICD-10-CM

## 2018-01-22 DIAGNOSIS — F3131 Bipolar disorder, current episode depressed, mild: Secondary | ICD-10-CM | POA: Diagnosis not present

## 2018-01-22 DIAGNOSIS — Z23 Encounter for immunization: Secondary | ICD-10-CM

## 2018-01-22 DIAGNOSIS — Z6841 Body Mass Index (BMI) 40.0 and over, adult: Secondary | ICD-10-CM

## 2018-01-22 DIAGNOSIS — J449 Chronic obstructive pulmonary disease, unspecified: Secondary | ICD-10-CM

## 2018-01-22 DIAGNOSIS — E1169 Type 2 diabetes mellitus with other specified complication: Secondary | ICD-10-CM | POA: Diagnosis not present

## 2018-01-22 DIAGNOSIS — M47816 Spondylosis without myelopathy or radiculopathy, lumbar region: Secondary | ICD-10-CM

## 2018-01-22 DIAGNOSIS — G4734 Idiopathic sleep related nonobstructive alveolar hypoventilation: Secondary | ICD-10-CM

## 2018-01-22 MED ORDER — TIOTROPIUM BROMIDE MONOHYDRATE 18 MCG IN CAPS: 18.0000 ug | ORAL_CAPSULE | Freq: Every day | RESPIRATORY_TRACT | 5 refills | Status: DC

## 2018-01-22 MED ORDER — DEXLANSOPRAZOLE 60 MG PO CPDR: 60.0000 mg | DELAYED_RELEASE_CAPSULE | Freq: Every day | ORAL | 5 refills | Status: DC

## 2018-01-22 NOTE — Progress Notes (Signed)
Name: Wendy Johnson   MRN: [id number]    DOB: May 04, 1966   Date:01/22/2018       Progress Note  Subjective  Chief Complaint  Chief Complaint  Patient presents with  . Medication Refill  . Diabetes  . Dyslipidemia  . DDD  . Gastroesophageal Reflux  . Obesity  . Manic Behavior  . COPD  . Sleep Apnea  . Referral    Dr. Johnathan Hausen [facility]  . Immunizations    flu shot  . URI    patient stated that she is wheezing and has lots of post nasal drip    HPI  DM: diet controlled with dyslipidemia: she states had eye exam at home through her insurance, we will try to get report. Denies polyphagia, polydipsia or polyuria.She still has low HDL, LDL elevated and triglycerides up. We will recheck labs.   Dyslipidemia: She has high triglycerides , low HDl and high LDL, she is taking statin therapy, and trying life style modification. We will recheck labs today  DDD lumbar spine: she sees pain clinic- Dr Andree Elk, she is on Oxycodone 10 mg QID, denies constipation - controlled with Benifiber, she has narcan at home. Pain is mostly on her back but also on her joints ( hips, knees and hands)   GERD: taking Dexilant and seen by Dr. Samuel Jester longer has heartburn or regurgitation, very seldom has symptoms.She needs a refill.   Morbid obesity:her maximum weight was around 273 lbs, she lost down to 184 lbs with physical activity and life style modification, but over the past couple of years she started to gain her weight back, started after knee and hip surgery, also states medication for bipolar makes her gain weight. She lost 7 lbs since last visit. She is trying to eat healthy, also was exercising until recent ventral hernia repair, she is interested in having bariatric surgery and we will refer.   Bipolar disorder: on multiple medications , given by Dr. Kasandra Knudsen. However under more stress today, her boyfriend's father is dying from melanoma complications, also worries about her  son  COPD : she states that she could not tolerate Breo, she would prefer staying on spiriva, she also uses oxygen at night. Uses albuterol on machine. Denies daily cough, she has occasional wheeze worse at night.   Alcohol history: she completely quit Feb 2017 , doing well in remission   Patient Active Problem List   Diagnosis Date Noted  . History of alcoholism ([place]) 07/19/2017  . Bilateral carpal tunnel syndrome 05/29/2017  . Painful total knee replacement, right ([place]) 10/11/2016  . Rotator cuff tendinitis, right 07/29/2016  . Occipital neuralgia 10/12/2015  . Instability of prosthetic knee ([place]) 09/22/2015  . Primary osteoarthritis of right hip 05/12/2015  . Sleep apnea 04/07/2015  . Primary osteoarthritis of left hip 12/16/2014  . Metabolic syndrome 83/15/1761  . Migraine without aura and without status migrainosus, not intractable 11/26/2014  . GERD without esophagitis 11/26/2014  . COPD, moderate ([place]) 11/26/2014  . Nocturnal oxygen desaturation 11/26/2014  . Supplemental oxygen dependent 11/26/2014  . Hearing loss 11/26/2014  . Chronic insomnia 11/26/2014  . History of hypertension 11/26/2014  . Dyslipidemia 11/26/2014  . Morbid obesity ([place]) 11/26/2014  . Dyslipidemia associated with type 2 diabetes mellitus ([place]) 11/26/2014  . Chronic constipation 11/26/2014  . Generalized anxiety disorder 11/11/2014  . DDD (degenerative disc disease), lumbar 11/11/2014  . Facet arthritis of lumbar region 11/11/2014  . Primary osteoarthritis involving multiple joints 11/11/2014  .  H/O hysterectomy for benign disease 10/20/2014  . Low back derangement syndrome 09/30/2014  . Bipolar disorder ([place]) 09/30/2014    Past Surgical History:  Procedure Laterality Date  . ABDOMINAL HYSTERECTOMY N/A 10/20/2014   Procedure: Total abdominial hysterectomy, bilateral salpingo-oophorectomy;  Surgeon: Brayton Mars, MD;  Location: [facility] ORS;  Service: Gynecology;  Laterality: N/A;  .  BILATERAL SALPINGOOPHORECTOMY    . bone spurs removed Bilateral   . CARPAL TUNNEL RELEASE Left 06/08/2017   Procedure: CARPAL TUNNEL RELEASE;  Surgeon: Hessie Knows, MD;  Location: [facility] ORS;  Service: Orthopedics;  Laterality: Left;  . CARPAL TUNNEL RELEASE Right 10/10/2017   Procedure: CARPAL TUNNEL RELEASE;  Surgeon: Hessie Knows, MD;  Location: [facility] ORS;  Service: Orthopedics;  Laterality: Right;  . COLONOSCOPY WITH PROPOFOL N/A 09/01/2016   Procedure: COLONOSCOPY WITH PROPOFOL;  Surgeon: Jonathon Bellows, MD;  [facility] ENDOSCOPY;  Service: Endoscopy;  Laterality: N/A;  . DORSAL COMPARTMENT RELEASE Left 06/08/2017   Procedure: RELEASE DORSAL COMPARTMENT (DEQUERVAIN);  Surgeon: Hessie Knows, MD;  Location: [facility] ORS;  Service: Orthopedics;  Laterality: Left;  . ESOPHAGOGASTRODUODENOSCOPY (EGD) WITH PROPOFOL N/A 09/01/2016   Procedure: ESOPHAGOGASTRODUODENOSCOPY (EGD) WITH PROPOFOL;  Surgeon: Jonathon Bellows, MD;  Location: [facility] ENDOSCOPY;  Service: Endoscopy;  Laterality: N/A;  . FOOT SURGERY Bilateral   . INSERTION OF MESH N/A 12/26/2017   Procedure: INSERTION OF MESH;  Surgeon: Jules Husbands, MD;  Location: [facility] ORS;  Service: General;  Laterality: N/A;  . JOINT REPLACEMENT Right    Total Knee replacement X 2  . JOINT REPLACEMENT Bilateral    Total Hip Replacement  . knee arthroscopo Right   . LAPAROSCOPY  09/22/2014   Procedure: LAPAROSCOPY OPERATIVE;  Surgeon: Brayton Mars, MD;  Location: [facility] ORS;  Service: Gynecology;;  excision and fulgeration of endomertriosis  . LIPOMA EXCISION    . ROBOTIC ASSISTED LAPAROSCOPIC VENTRAL/INCISIONAL HERNIA REPAIR N/A 12/26/2017   Procedure: ROBOTIC ASSISTED LAPAROSCOPIC VENTRAL/INCISIONAL HERNIA REPAIR;  Surgeon: Jules Husbands, MD;  Location: [facility] ORS;  Service: General;  Laterality: N/A;  . TONSILLECTOMY    . TOTAL HIP ARTHROPLASTY Left 12/16/2014   Procedure: TOTAL HIP ARTHROPLASTY ANTERIOR APPROACH;  Surgeon: Hessie Knows, MD;  Location: [facility] ORS;   Service: Orthopedics;  Laterality: Left;  . TOTAL HIP ARTHROPLASTY Right 05/12/2015   Procedure: TOTAL HIP ARTHROPLASTY ANTERIOR APPROACH;  Surgeon: Hessie Knows, MD;  Location: [facility] ORS;  Service: Orthopedics;  Laterality: Right;  . TOTAL KNEE REVISION Right 09/22/2015   Procedure: TOTAL KNEE REVISION/ REVISE POLYIETHYLENE;  Surgeon: Hessie Knows, MD;  Location: [facility] OR[facility];  Service: Orthopedics;  Laterality: Right;  . TOTAL KNEE REVISION Right 10/11/2016   Procedure: TOTAL KNEE REVISION;  Surgeon: Hessie Knows, MD;  Location: [facility] ORS;  Service: Orthopedics;  Laterality: Right;  . TUBAL LIGATION      Family History  Problem Relation Age of Onset  . Diabetes Mother   . Heart disease Father   . Heart attack Father   . Diabetes Sister   . Breast cancer Maternal Aunt        <50  . Breast cancer Paternal Aunt        x2.  <50  . Healthy Son   . Drug abuse Sister     Social History   Socioeconomic History  . Marital status: Divorced    Spouse name: Not on file  . Number of children: 1  . Years of education: Not on file  . Highest education level: 10th grade  Occupational  History  . Occupation: disabled  Social Needs  . Financial resource strain: Not hard at all  . Food insecurity:    Worry: Never true    Inability: Never true  . Transportation needs:    Medical: No    Non-medical: No  Tobacco Use  . Smoking status: Former Smoker    Packs/day: 0.50    Years: 37.00    Pack years: 18.50    Types: Cigarettes    Start date: 11/26/1979    Last attempt to quit: 11/11/2017    Years since quitting: 0.1  . Smokeless tobacco: Never Used  . Tobacco comment: smoking cessation materials not required  Substance and Sexual Activity  . Alcohol use: No    Alcohol/week: 0.0 standard drinks    Comment: but used to be a heavy drinker, quit after DUI 2021  . Drug use: No    Comment: quit crack cocaine 2004  . Sexual activity: Yes    Partners: Male    Birth control/protection: None   Lifestyle  . Physical activity:    Days per week: 4 days    Minutes per session: 60 min  . Stress: Not at all  Relationships  . Social connections:    Talks on phone: More than three times a week    Gets together: Twice a week    Attends religious service: Never    Active member of club or organization: Patient refused    Attends meetings of clubs or organizations: Patient refused    Relationship status: Divorced  . Intimate partner violence:    Fear of current or ex partner: No    Emotionally abused: No    Physically abused: No    Forced sexual activity: No  Other Topics Concern  . Not on file  Social History Narrative   Lives with boyfriend   Disable from psychiatric disease     Current Outpatient Medications:  .  albuterol (PROVENTIL) (2.5 MG/3ML) 0.083% nebulizer solution, Take 2.5 mg by nebulization every 6 (six) hours as needed for wheezing or shortness of breath., Disp: , Rfl:  .  amphetamine-dextroamphetamine (ADDERALL) 30 MG tablet, Take 30 mg by mouth 2 (two) times daily., Disp: , Rfl:  .  ARIPiprazole (ABILIFY) 5 MG tablet, Take 5 mg by mouth daily. , Disp: , Rfl:  .  Biotin 5 MG TABS, Take 1 tablet by mouth daily., Disp: , Rfl:  .  busPIRone (BUSPAR) 30 MG tablet, Take 30 mg by mouth 2 (two) times daily. , Disp: , Rfl:  .  clonazePAM (KLONOPIN) 1 MG tablet, Take 2 mg by mouth 3 (three) times daily., Disp: , Rfl:  .  cyclobenzaprine (FLEXERIL) 5 MG tablet, Take 1 tablet (5 mg total) by mouth 3 (three) times daily as needed for muscle spasms., Disp: 30 tablet, Rfl: 0 .  dexlansoprazole (DEXILANT) 60 MG capsule, Take 1 capsule (60 mg total) by mouth daily., Disp: 30 capsule, Rfl: 5 .  hydrOXYzine (ATARAX/VISTARIL) 25 MG tablet, Take 25 mg by mouth every 6 (six) hours as needed for anxiety or itching (hives)., Disp: , Rfl:  .  naloxone (NARCAN) nasal spray 4 mg/0.1 mL, For respiratory suppression from opioids, Disp: 1 kit, Rfl: 2 .  Oxycodone HCl 10 MG TABS, Take 1  tablet (10 mg total) by mouth every 4 (four) hours while awake., Disp: 120 tablet, Rfl: 0 .  sertraline (ZOLOFT) 100 MG tablet, Take 100 mg by mouth 2 (two) times daily., Disp: , Rfl:  .  tiotropium (SPIRIVA HANDIHALER) 18 MCG inhalation capsule, Place 1 capsule (18 mcg total) into inhaler and inhale daily., Disp: 30 capsule, Rfl: 5 .  traZODone (DESYREL) 100 MG tablet, Take 300 mg by mouth at bedtime., Disp: , Rfl:   Allergies  Allergen Reactions  . Codeine Nausea Only  . Imitrex [Sumatriptan] Other (See Comments)    Chest pain    I personally reviewed active problem list, medication list, allergies, family history, social history with the patient/caregiver today.   ROS  Constitutional: Negative for fever or weight change.  Respiratory: Negative for cough and shortness of breath.   Cardiovascular: Negative for chest pain or palpitations.  Gastrointestinal: Negative for abdominal pain, no bowel changes.  Musculoskeletal: Negative for gait problem or joint swelling.  Skin: Negative for rash.  Neurological: Negative for dizziness or headache.  No other specific complaints in a complete review of systems (except as listed in HPI above).  Objective  Vitals:   01/22/18 0757  BP: 132/84  Pulse: (!) 115  Resp: 18  Temp: 97.6 F (36.4 C)  TempSrc: Oral  SpO2: 98%  Weight: 266 lb 12.8 oz (121 kg)  Height: 5' 7"  (1.702 m)    Body mass index is 41.79 kg/m.  Physical Exam  Constitutional: Patient appears well-developed and well-nourished. Obese  No distress.  HEENT: head atraumatic, normocephalic, pupils equal and reactive to light,  neck supple, throat within normal limits Cardiovascular: Normal rate, regular rhythm and normal heart sounds.  No murmur heard. No BLE edema. Pulmonary/Chest: Effort normal and breath sounds normal. No respiratory distress. Abdominal: Soft.  There is no tenderness. Muscular Skeletal: pain during palpation of lumbar spine,  Psychiatric: Patient has  a normal mood and affect. behavior is normal. Judgment and thought content normal.  Recent Results (from the past 2160 hour(s))  CBC     Status: Abnormal   Collection Time: 12/20/17 11:53 AM  Result Value Ref Range   WBC 10.2 3.6 - 11.0 K/uL   RBC 4.83 3.80 - 5.20 MIL/uL   Hemoglobin 13.8 12.0 - 16.0 g/dL   HCT 40.9 35.0 - 47.0 %   MCV 84.7 80.0 - 100.0 fL   MCH 28.5 26.0 - 34.0 pg   MCHC 33.7 32.0 - 36.0 g/dL   RDW 17.0 (H) 11.5 - 14.5 %   Platelets 223 150 - 440 K/uL    Comment: Performed at [facility], [address] [place], [place] [address]  Basic metabolic panel     Status: None   Collection Time: 12/20/17 11:53 AM  Result Value Ref Range   Sodium 138 135 - 145 mmol/L   Potassium 4.0 3.5 - 5.1 mmol/L   Chloride 104 98 - 111 mmol/L   CO2 29 22 - 32 mmol/L   Glucose, Bld 92 70 - 99 mg/dL   BUN 12 6 - 20 mg/dL   Creatinine, Ser 0.67 0.44 - 1.00 mg/dL   Calcium 9.2 8.9 - 10.3 mg/dL   GFR calc non Af Amer >60 >60 mL/min   GFR calc Af Amer >60 >60 mL/min    Comment: (NOTE) The eGFR has been calculated using the CKD EPI equation. This calculation has not been validated in all clinical situations. eGFR's persistently <60 mL/min signify possible Chronic Kidney Disease.    Anion gap 5 5 - 15    Comment: Performed at [facility], [address], [place], [place] [address]  Miscellaneous [facility] test (send-out)     Status: None   Collection  Time: 12/25/17 10:15 AM  Result Value Ref Range   [facility] test code [id number]    [facility] test name NICOTINE METABOLITA SCREEN     Comment: Performed at [facility], [address] [place], [place] [address]   Misc [facility] result COMMENT     Comment: (NOTE) Test Ordered: [phone] Nicotine Metabolite, Urine Cotinine                       Negative         ng/mL    UI     Reference Range: Cutoff=300                             Drug Screen Comment:         Comment                   BN   This analysis is  performed by immunoassay. Positive findings are unconfirmed analytical test results; if results do not support expected clinical finding, confirmation by an alternate methodology is recommended. Patient metabolic variables, specific drug chemistry, and specimen characteristics can affect test outcome. Technical consultation is available at [email or web address], or call toll free [phone]. Performed At: [facility] [address] [place], [place] [id number] Rush Farmer MD IE:[id number] Performed At: [facility] [address] [place], [place] [id number] Avis Epley PhD FU:[id number]   Urine Drug Screen, Qualitative ([place] only)     Status: Abnormal   Collection Time: 12/26/17  5:59 AM  Result Value Ref Range   Tricyclic, Ur Screen NONE DETECTED NONE DETECTED   Amphetamines, Ur Screen NONE DETECTED NONE DETECTED   MDMA (Ecstasy)Ur Screen NONE DETECTED NONE DETECTED   Cocaine Metabolite,Ur [place] NONE DETECTED NONE DETECTED   Opiate, Ur Screen POSITIVE (A) NONE DETECTED   Phencyclidine (PCP) Ur S NONE DETECTED NONE DETECTED   Cannabinoid 50 Ng, Ur [place] NONE DETECTED NONE DETECTED   Barbiturates, Ur Screen NONE DETECTED NONE DETECTED   Benzodiazepine, Ur Scrn TEST NOT PERFORMED, REAGENT NOT AVAILABLE (A) NONE DETECTED   Methadone Scn, Ur NONE DETECTED NONE DETECTED    Comment: (NOTE) Tricyclics + metabolites, urine    Cutoff 1000 ng/mL Amphetamines + metabolites, urine  Cutoff 1000 ng/mL MDMA (Ecstasy), urine              Cutoff 500 ng/mL Cocaine Metabolite, urine          Cutoff 300 ng/mL Opiate + metabolites, urine        Cutoff 300 ng/mL Phencyclidine (PCP), urine         Cutoff 25 ng/mL Cannabinoid, urine                 Cutoff 50 ng/mL Barbiturates + metabolites, urine  Cutoff 200 ng/mL Benzodiazepine, urine              Cutoff 200 ng/mL Methadone, urine                   Cutoff 300 ng/mL The urine drug screen provides only a preliminary, unconfirmed analytical  test result and should not be used for non-medical purposes. Clinical consideration and professional judgment should be applied to any positive drug screen result due to possible interfering substances. A more specific alternate chemical method must be used in order to obtain a confirmed analytical result. Gas chromatography / mass spectrometry (GC/MS) is the preferred confirmat ory method. Performed at  [facility], [place], [place] [address]   Glucose, capillary     Status: Abnormal   Collection Time: 12/26/17 12:44 PM  Result Value Ref Range   Glucose-Capillary 145 (H) 70 - 99 mg/dL    Diabetic Foot Exam: Diabetic Foot Exam - Simple   Simple Foot Form Diabetic Foot exam was performed with the following findings:  Yes 01/22/2018  8:29 AM  Visual Inspection See comments:  Yes Sensation Testing Intact to touch and monofilament testing bilaterally:  Yes Pulse Check Posterior Tibialis and Dorsalis pulse intact bilaterally:  Yes Comments No toe nails - removed years ago       PHQ2/9: Depression screen [facility] 2/9 01/22/2018 12/12/2017 10/27/2017 09/05/2017 07/07/2017  Decreased Interest 1 0 0 0 0  Down, Depressed, Hopeless 0 0 0 0 0  PHQ - 2 Score 1 0 0 0 0  Altered sleeping 0 - - - -  Tired, decreased energy 1 - - - -  Change in appetite 2 - - - -  Feeling bad or failure about yourself  0 - - - -  Trouble concentrating 2 - - - -  Moving slowly or fidgety/restless 0 - - - -  Suicidal thoughts 0 - - - -  PHQ-9 Score 6 - - - -  Difficult doing work/chores Not difficult at all - - - -     Fall Risk: Fall Risk  01/22/2018 12/12/2017 10/27/2017 09/05/2017 07/19/2017  Falls in the past year? No No No No No  Number falls in past yr: - - - - -  Injury with Fall? - - - - -  [place] for fall due to : - - - - -  Follow up - - - - -  Comment - - - - -     Functional Status Survey: Is the patient deaf or have difficulty hearing?: No Does the  patient have difficulty seeing, even when wearing glasses/contacts?: No Does the patient have difficulty concentrating, remembering, or making decisions?: No Does the patient have difficulty walking or climbing stairs?: No Does the patient have difficulty dressing or bathing?: No Does the patient have difficulty doing errands alone such as visiting a doctor's office or shopping?: No   Assessment & Plan   1. GERD without esophagitis  - dexlansoprazole (DEXILANT) 60 MG capsule; Take 1 capsule (60 mg total) by mouth daily.  Dispense: 30 capsule; Refill: 5  2. Bipolar affective disorder, currently depressed, mild ([place])  Under the care of Dr. Su  3. Morbid obesity with BMI of 40.0-44.9, adult ([place])  - Amb Referral to Bariatric Surgery  4. Dyslipidemia associated with type 2 diabetes mellitus (HCC)  - Lipid panel - Hemoglobin A1c - Urine Microalbumin w/creat. ratio  5. History of alcoholism ([place])  Quit in 2017  6. COPD, moderate ([place])  - tiotropium (SPIRIVA HANDIHALER) 18 MCG inhalation capsule; Place 1 capsule (18 mcg total) into inhaler and inhale daily.  Dispense: 30 capsule; Refill: 5  7. Needs flu shot  - Flu Vaccine QUAD 6+ mos PF IM (Fluarix Quad PF)  8. Dyslipidemia  - Lipid panel  9. Facet arthritis of lumbar region  Under the care of Dr. Andree Elk  10. Nocturnal oxygen desaturation   11. Long-term use of high-risk medication  - CBC with Differential/Platelet - COMPLETE METABOLIC PANEL WITH GFR

## 2018-01-23 LAB — CBC WITH DIFFERENTIAL/PLATELET
BASOS PCT: 0.6 %
Basophils Absolute: 61 cells/uL (ref 0–200)
Eosinophils Absolute: 333 cells/uL (ref 15–500)
Eosinophils Relative: 3.3 %
HEMATOCRIT: 44.7 % (ref 35.0–45.0)
Hemoglobin: 14.7 g/dL (ref 11.7–15.5)
LYMPHS ABS: 1858 {cells}/uL (ref 850–3900)
MCH: 27.9 pg (ref 27.0–33.0)
MCHC: 32.9 g/dL (ref 32.0–36.0)
MCV: 84.8 fL (ref 80.0–100.0)
MPV: 11.4 fL (ref 7.5–12.5)
Monocytes Relative: 7 %
Neutro Abs: 7141 cells/uL (ref 1500–7800)
Neutrophils Relative %: 70.7 %
PLATELETS: 308 10*3/uL (ref 140–400)
RBC: 5.27 10*6/uL — AB (ref 3.80–5.10)
RDW: 14 % (ref 11.0–15.0)
Total Lymphocyte: 18.4 %
WBC: 10.1 10*3/uL (ref 3.8–10.8)
WBCMIX: 707 {cells}/uL (ref 200–950)

## 2018-01-23 LAB — LIPID PANEL
CHOL/HDL RATIO: 5.5 (calc) — AB (ref <5.0)
CHOLESTEROL: 171 mg/dL (ref <200)
HDL: 31 mg/dL — ABNORMAL LOW (ref >50)
LDL Cholesterol (Calc): 90 mg/dL (calc)
Non-HDL Cholesterol (Calc): 140 mg/dL (calc) — ABNORMAL HIGH (ref <130)
Triglycerides: 372 mg/dL — ABNORMAL HIGH (ref <150)

## 2018-01-23 LAB — COMPLETE METABOLIC PANEL WITH GFR
AG Ratio: 1.5 (calc) (ref 1.0–2.5)
ALKALINE PHOSPHATASE (APISO): 107 U/L (ref 33–130)
ALT: 23 U/L (ref 6–29)
AST: 18 U/L (ref 10–35)
Albumin: 4.3 g/dL (ref 3.6–5.1)
BILIRUBIN TOTAL: 0.3 mg/dL (ref 0.2–1.2)
BUN: 11 mg/dL (ref 7–25)
CO2: 24 mmol/L (ref 20–32)
CREATININE: 0.64 mg/dL (ref 0.50–1.05)
Calcium: 9.7 mg/dL (ref 8.6–10.4)
Chloride: 103 mmol/L (ref 98–110)
GFR, Est African American: 121 mL/min/{1.73_m2} (ref >=60)
GFR, Est Non African American: 104 mL/min/{1.73_m2} (ref >=60)
GLOBULIN: 2.9 g/dL (ref 1.9–3.7)
Glucose, Bld: 99 mg/dL (ref 65–99)
Potassium: 4.1 mmol/L (ref 3.5–5.3)
SODIUM: 139 mmol/L (ref 135–146)
Total Protein: 7.2 g/dL (ref 6.1–8.1)

## 2018-01-23 LAB — MICROALBUMIN / CREATININE URINE RATIO
Creatinine, Urine: 71 mg/dL (ref 20–275)
MICROALB/CREAT RATIO: 4 ug/mg{creat} (ref <30)
Microalb, Ur: 0.3 mg/dL

## 2018-01-23 LAB — HEMOGLOBIN A1C
EAG (MMOL/L): 6.2 (calc)
Hgb A1c MFr Bld: 5.5 % of total Hgb (ref <5.7)
MEAN PLASMA GLUCOSE: 111 (calc)

## 2018-01-24 ENCOUNTER — Ambulatory Visit: Payer: Medicare Other | Admitting: Family Medicine

## 2018-01-24 ENCOUNTER — Ambulatory Visit: Payer: Medicare Other | Admitting: Nurse Practitioner

## 2018-01-24 DIAGNOSIS — M4807 Spinal stenosis, lumbosacral region: Secondary | ICD-10-CM | POA: Diagnosis not present

## 2018-01-24 DIAGNOSIS — M47816 Spondylosis without myelopathy or radiculopathy, lumbar region: Secondary | ICD-10-CM | POA: Diagnosis not present

## 2018-01-24 DIAGNOSIS — M7061 Trochanteric bursitis, right hip: Secondary | ICD-10-CM | POA: Diagnosis not present

## 2018-01-24 DIAGNOSIS — M5442 Lumbago with sciatica, left side: Secondary | ICD-10-CM | POA: Diagnosis not present

## 2018-01-25 ENCOUNTER — Ambulatory Visit: Payer: Medicare Other | Admitting: Family Medicine

## 2018-01-26 ENCOUNTER — Other Ambulatory Visit: Payer: Self-pay | Admitting: Orthopedic Surgery

## 2018-01-26 DIAGNOSIS — M4807 Spinal stenosis, lumbosacral region: Secondary | ICD-10-CM

## 2018-01-27 DIAGNOSIS — J449 Chronic obstructive pulmonary disease, unspecified: Secondary | ICD-10-CM | POA: Diagnosis not present

## 2018-01-27 DIAGNOSIS — G4733 Obstructive sleep apnea (adult) (pediatric): Secondary | ICD-10-CM | POA: Diagnosis not present

## 2018-02-01 ENCOUNTER — Telehealth: Payer: Self-pay | Admitting: Family Medicine

## 2018-02-01 NOTE — Telephone Encounter (Signed)
Please advise 

## 2018-02-01 NOTE — Telephone Encounter (Signed)
She must be seen

## 2018-02-01 NOTE — Telephone Encounter (Signed)
Copied from [place] [id number]. Topic: Inquiry >> Feb 01, 2018 10:41 AM Margot Ables wrote: Reason for CRM: pt called stating she is coughing a lot and has green mucus coming up. Pt also having nasal congestion. Pt states she has COPD as well. Pt is requesting to have medication called in for her because once it's in her chest she needs medicine to get rid of it. Pt states that she doesn't have the gas to come up for an appt. Pt states that she is using O2 at night and just can't stop coughing.  Pt states she has had z-pack for this issue before. Please call pt to advise.  MEDICAL [address] [place], [place] - [place] [phone] (Phone) [phone] (Fax)

## 2018-02-02 ENCOUNTER — Ambulatory Visit (INDEPENDENT_AMBULATORY_CARE_PROVIDER_SITE_OTHER): Payer: Medicare Other | Admitting: Nurse Practitioner

## 2018-02-02 ENCOUNTER — Encounter: Payer: Self-pay | Admitting: Nurse Practitioner

## 2018-02-02 VITALS — BP 136/70 | HR 105 | Temp 98.2°F | Resp 18 | Ht 64.0 in | Wt 267.1 lb

## 2018-02-02 DIAGNOSIS — J449 Chronic obstructive pulmonary disease, unspecified: Secondary | ICD-10-CM | POA: Diagnosis not present

## 2018-02-02 DIAGNOSIS — R059 Cough, unspecified: Secondary | ICD-10-CM

## 2018-02-02 DIAGNOSIS — R0981 Nasal congestion: Secondary | ICD-10-CM

## 2018-02-02 DIAGNOSIS — R05 Cough: Secondary | ICD-10-CM | POA: Diagnosis not present

## 2018-02-02 DIAGNOSIS — R0689 Other abnormalities of breathing: Secondary | ICD-10-CM

## 2018-02-02 DIAGNOSIS — J3489 Other specified disorders of nose and nasal sinuses: Secondary | ICD-10-CM

## 2018-02-02 MED ORDER — FLUTICASONE PROPIONATE 50 MCG/ACT NA SUSP: 2.0000 | Freq: Every day | NASAL | 6 refills | Status: DC

## 2018-02-02 MED ORDER — PROMETHAZINE-DM 6.25-15 MG/5ML PO SYRP: 2.5000 mL | ORAL_SOLUTION | Freq: Four times a day (QID) | ORAL | 0 refills | Status: DC | PRN

## 2018-02-02 MED ORDER — ALBUTEROL SULFATE (2.5 MG/3ML) 0.083% IN NEBU: 2.5000 mg | INHALATION_SOLUTION | Freq: Four times a day (QID) | RESPIRATORY_TRACT | 1 refills | Status: DC | PRN

## 2018-02-02 MED ORDER — LORATADINE 10 MG PO TABS: 10.0000 mg | ORAL_TABLET | Freq: Every day | ORAL | 1 refills | Status: DC

## 2018-02-02 MED ORDER — AZITHROMYCIN 250 MG PO TABS: ORAL_TABLET | ORAL | 0 refills | Status: DC

## 2018-02-02 NOTE — Patient Instructions (Addendum)
-   Please take the prescribed medications as needed for comfort - Take antibiotic as prescribed for the whole course even if you feel better; recommend taking probiotic to help gut health - If you develop worsening shortness of breath, weakness, chest pain go to ER.  - it is important that you drink plenty of fluids, rest. Cover your nose/mouth when you cough or sneeze and wash your hands well and often. Here are some helpful things you can use or pick up over the counter from the pharmacy to help with your symptoms:   For Fever/Pain: Acetaminophen every 6 hours as needed (maximum of 3000mg  a day). If you are still uncomfortable you can add ibuprofen OR naproxen  For coughing: try dextromethorphan for a cough suppressant, and/or a cool mist humidifier, lozenges  For sore throat: saline gargles, honey herbal tea, lozenges, throat spray  To dry out your nose: try an antihistamine like loratadine (non-sedating) or diphenhydramine (sedating) or others To relieve a stuffy nose: try neti pot, or nasal saline irrigation  To make blowing your nose easier: guaifenesin   Try to use PLAIN allergy medicine without the decongestant Avoid: phenylephrine, phenylpropanolamine, and pseudoephredine

## 2018-02-02 NOTE — Progress Notes (Signed)
Name: Wendy Johnson   MRN: [id number]    DOB: 1966/01/28   Date:02/02/2018       Progress Note  Subjective  Chief Complaint  Chief Complaint  Patient presents with  . Cough    Cough, congestion, pressure, onset 5 days ago    HPI  States cough started about 5 days ago productive green phlegm; states bilateral facial pressure lots of nasal congestion. States cant smoke right now due to coughing with deep inspiration. Coughing keeps her up at night.  Taking musinex DM, BC powder, dayquil and nyquil.  Endorses fevers, chills, sore throat. States shortness of breath with exertion No chest pain, body aches, nausea.    Denies palpitations; last used albuterol nebulizer treatment yesterday; possibly using oral decongestants states using unknown sinus OTC medications.  Patient Active Problem List   Diagnosis Date Noted  . History of alcoholism ([place]) 07/19/2017  . Bilateral carpal tunnel syndrome 05/29/2017  . Painful total knee replacement, right ([place]) 10/11/2016  . Rotator cuff tendinitis, right 07/29/2016  . Occipital neuralgia 10/12/2015  . Instability of prosthetic knee ([place]) 09/22/2015  . Primary osteoarthritis of right hip 05/12/2015  . Sleep apnea 04/07/2015  . Primary osteoarthritis of left hip 12/16/2014  . Metabolic syndrome 26/20/3559  . Migraine without aura and without status migrainosus, not intractable 11/26/2014  . GERD without esophagitis 11/26/2014  . COPD, moderate ([place]) 11/26/2014  . Nocturnal oxygen desaturation 11/26/2014  . Supplemental oxygen dependent 11/26/2014  . Hearing loss 11/26/2014  . Chronic insomnia 11/26/2014  . History of hypertension 11/26/2014  . Dyslipidemia 11/26/2014  . Morbid obesity ([place]) 11/26/2014  . Dyslipidemia associated with type 2 diabetes mellitus ([place]) 11/26/2014  . Chronic constipation 11/26/2014  . Generalized anxiety disorder 11/11/2014  . DDD (degenerative disc disease), lumbar 11/11/2014  . Facet arthritis of lumbar region  11/11/2014  . Primary osteoarthritis involving multiple joints 11/11/2014  . H/O hysterectomy for benign disease 10/20/2014  . Low back derangement syndrome 09/30/2014  . Bipolar disorder ([place]) 09/30/2014    Past Medical History:  Diagnosis Date  . ADHD (attention deficit hyperactivity disorder)   . Anxiety   . AR (allergic rhinitis)   . Arthritis   . Benign hypertension    NO MEDS  . Bipolar disorder ([place])   . Chronic back pain   . Chronic constipation   . Chronic insomnia   . COPD (chronic obstructive pulmonary disease) ([place])   . Deaf    RIGHT EAR  . Decreased dorsalis pedis pulse   . Depression   . Diabetes mellitus without complication ([place])    history of DM but no longer has  . Dyslipidemia   . Fatty liver   . GERD (gastroesophageal reflux disease)   . Hepatomegaly   . High cholesterol   . Hot flashes   . Migraine with aura   . OCD (obsessive compulsive disorder)   . Plantar warts   . Severe obesity ([place])   . Shortness of breath dyspnea   . Sleep apnea    NO CPAP  . Tobacco use   . Trochanteric bursitis of right hip     Past Surgical History:  Procedure Laterality Date  . ABDOMINAL HYSTERECTOMY N/A 10/20/2014   Procedure: Total abdominial hysterectomy, bilateral salpingo-oophorectomy;  Surgeon: Brayton Mars, MD;  Location: [facility];  Service: Gynecology;  Laterality: N/A;  . BILATERAL SALPINGOOPHORECTOMY    . bone spurs removed Bilateral   . CARPAL TUNNEL RELEASE Left 06/08/2017   Procedure: CARPAL  TUNNEL RELEASE;  Surgeon: Hessie Knows, MD;  Location: [facility];  Service: Orthopedics;  Laterality: Left;  . CARPAL TUNNEL RELEASE Right 10/10/2017   Procedure: CARPAL TUNNEL RELEASE;  Surgeon: Hessie Knows, MD;  Location: [facility];  Service: Orthopedics;  Laterality: Right;  . COLONOSCOPY WITH PROPOFOL N/A 09/01/2016   Procedure: COLONOSCOPY WITH PROPOFOL;  Surgeon: Jonathon Bellows, MD;  Location: [facility];  Service: Endoscopy;  Laterality: N/A;  . DORSAL  COMPARTMENT RELEASE Left 06/08/2017   Procedure: RELEASE DORSAL COMPARTMENT (DEQUERVAIN);  Surgeon: Hessie Knows, MD;  Location: [facility];  Service: Orthopedics;  Laterality: Left;  . ESOPHAGOGASTRODUODENOSCOPY (EGD) WITH PROPOFOL N/A 09/01/2016   Procedure: ESOPHAGOGASTRODUODENOSCOPY (EGD) WITH PROPOFOL;  Surgeon: Jonathon Bellows, MD;  Location: [facility];  Service: Endoscopy;  Laterality: N/A;  . FOOT SURGERY Bilateral   . INSERTION OF MESH N/A 12/26/2017   Procedure: INSERTION OF MESH;  Surgeon: Jules Husbands, MD;  Location: [facility];  Service: General;  Laterality: N/A;  . JOINT REPLACEMENT Right    Total Knee replacement X 2  . JOINT REPLACEMENT Bilateral    Total Hip Replacement  . knee arthroscopo Right   . LAPAROSCOPY  09/22/2014   Procedure: LAPAROSCOPY OPERATIVE;  Surgeon: Brayton Mars, MD;  Location: [facility];  Service: Gynecology;;  excision and fulgeration of endomertriosis  . LIPOMA EXCISION    . ROBOTIC ASSISTED LAPAROSCOPIC VENTRAL/INCISIONAL HERNIA REPAIR N/A 12/26/2017   Procedure: ROBOTIC ASSISTED LAPAROSCOPIC VENTRAL/INCISIONAL HERNIA REPAIR;  Surgeon: Jules Husbands, MD;  Location: [facility];  Service: General;  Laterality: N/A;  . TONSILLECTOMY    . TOTAL HIP ARTHROPLASTY Left 12/16/2014   Procedure: TOTAL HIP ARTHROPLASTY ANTERIOR APPROACH;  Surgeon: Hessie Knows, MD;  Location: [facility];  Service: Orthopedics;  Laterality: Left;  . TOTAL HIP ARTHROPLASTY Right 05/12/2015   Procedure: TOTAL HIP ARTHROPLASTY ANTERIOR APPROACH;  Surgeon: Hessie Knows, MD;  Location: [facility];  Service: Orthopedics;  Laterality: Right;  . TOTAL KNEE REVISION Right 09/22/2015   Procedure: TOTAL KNEE REVISION/ REVISE POLYIETHYLENE;  Surgeon: Hessie Knows, MD;  Location: [facility];  Service: Orthopedics;  Laterality: Right;  . TOTAL KNEE REVISION Right 10/11/2016   Procedure: TOTAL KNEE REVISION;  Surgeon: Hessie Knows, MD;  Location: [facility];  Service: Orthopedics;  Laterality: Right;  .  TUBAL LIGATION      Social History   Tobacco Use  . Smoking status: Current Every Day Smoker    Packs/day: 0.50    Years: 37.00    Pack years: 18.50    Types: Cigarettes    Start date: 11/26/1979    Last attempt to quit: 11/11/2017    Years since quitting: 0.2  . Smokeless tobacco: Never Used  . Tobacco comment: smoking cessation materials not required  Substance Use Topics  . Alcohol use: No    Alcohol/week: 0.0 standard drinks    Comment: but used to be a heavy drinker, quit after DUI 2021     Current Outpatient Medications:  .  albuterol (PROVENTIL) (2.5 MG/3ML) 0.083% nebulizer solution, Take 3 mLs (2.5 mg total) by nebulization every 6 (six) hours as needed for wheezing or shortness of breath., Disp: 75 mL, Rfl: 1 .  amphetamine-dextroamphetamine (ADDERALL) 30 MG tablet, Take 30 mg by mouth 2 (two) times daily., Disp: , Rfl:  .  ARIPiprazole (ABILIFY) 5 MG tablet, Take 5 mg by mouth daily. , Disp: , Rfl:  .  busPIRone (BUSPAR) 30 MG tablet, Take 30 mg by mouth 2 (two) times daily. ,  Disp: , Rfl:  .  clonazePAM (KLONOPIN) 1 MG tablet, Take 2 mg by mouth 3 (three) times daily., Disp: , Rfl:  .  cyclobenzaprine (FLEXERIL) 5 MG tablet, Take 1 tablet (5 mg total) by mouth 3 (three) times daily as needed for muscle spasms., Disp: 30 tablet, Rfl: 0 .  dexlansoprazole (DEXILANT) 60 MG capsule, Take 1 capsule (60 mg total) by mouth daily., Disp: 30 capsule, Rfl: 5 .  hydrOXYzine (ATARAX/VISTARIL) 25 MG tablet, Take 25 mg by mouth every 6 (six) hours as needed for anxiety or itching (hives)., Disp: , Rfl:  .  naloxone (NARCAN) nasal spray 4 mg/0.1 mL, For respiratory suppression from opioids, Disp: 1 kit, Rfl: 2 .  Oxycodone HCl 10 MG TABS, Take 1 tablet (10 mg total) by mouth every 4 (four) hours while awake., Disp: 120 tablet, Rfl: 0 .  sertraline (ZOLOFT) 100 MG tablet, Take 100 mg by mouth 2 (two) times daily., Disp: , Rfl:  .  tiotropium (SPIRIVA HANDIHALER) 18 MCG inhalation capsule,  Place 1 capsule (18 mcg total) into inhaler and inhale daily., Disp: 30 capsule, Rfl: 5 .  traZODone (DESYREL) 100 MG tablet, Take 300 mg by mouth at bedtime., Disp: , Rfl:  .  azithromycin (ZITHROMAX) 250 MG tablet, 2 tablets first day and one tablet for the next 4 days., Disp: 6 tablet, Rfl: 0 .  Biotin 5 MG TABS, Take 1 tablet by mouth daily., Disp: , Rfl:  .  fluticasone (FLONASE) 50 MCG/ACT nasal spray, Place 2 sprays into both nostrils daily., Disp: 16 g, Rfl: 6 .  loratadine (CLARITIN) 10 MG tablet, Take 1 tablet (10 mg total) by mouth daily., Disp: 30 tablet, Rfl: 1 .  promethazine-dextromethorphan (PROMETHAZINE-DM) 6.25-15 MG/5ML syrup, Take 2.5 mLs by mouth 4 (four) times daily as needed for cough., Disp: 118 mL, Rfl: 0  Allergies  Allergen Reactions  . Codeine Nausea Only  . Imitrex [Sumatriptan] Other (See Comments)    Chest pain    ROS  No other specific complaints in a complete review of systems (except as listed in HPI above).  Objective  Vitals:   02/02/18 1319 02/02/18 1349  BP: 136/70   Pulse: (!) 116 (!) 105  Resp:  18  Temp: 98.2 F (36.8 C)   SpO2: 98% 98%  Weight: 267 lb 1.6 oz (121.2 kg)   Height: 5' 4" (1.626 m)     Body mass index is 45.85 kg/m.  Nursing Note and Vital Signs reviewed.  Physical Exam  Constitutional: She is oriented to person, place, and time. She appears well-developed and well-nourished.  HENT:  Head: Normocephalic.  Right Ear: Hearing, tympanic membrane, external ear and ear canal normal.  Left Ear: Hearing, tympanic membrane, external ear and ear canal normal.  Nose: Mucosal edema (right nares) present. Right sinus exhibits maxillary sinus tenderness and frontal sinus tenderness. Left sinus exhibits frontal sinus tenderness (mild). Left sinus exhibits no maxillary sinus tenderness.  Mouth/Throat: Uvula is midline, oropharynx is clear and moist and mucous membranes are normal.  Eyes: Pupils are equal, round, and reactive to  light. Conjunctivae and EOM are normal.  Neck: Normal range of motion. Neck supple. Carotid bruit is not present.  Cardiovascular: Regular rhythm and intact distal pulses.  Elevated rate  Pulmonary/Chest: Effort normal. She has wheezes (wheezing bilaterally throughout). She exhibits no tenderness.  Abdominal: There is no CVA tenderness.  Neurological: She is alert and oriented to person, place, and time. She has normal strength.  Skin: Skin is  warm, dry and intact. No erythema.  Psychiatric: She has a normal mood and affect. Her speech is normal and behavior is normal. Judgment normal.  Vitals reviewed.     No results found for this or any previous visit (from the past 48 hour(s)).  Assessment & Plan  1. Cough Discussed appropriate use of antibiotics and risks and benefits; utilized shared decision-making- if unimproved will use antibiotics. - promethazine-dextromethorphan (PROMETHAZINE-DM) 6.25-15 MG/5ML syrup; Take 2.5 mLs by mouth 4 (four) times daily as needed for cough.  Dispense: 118 mL; Refill: 0 - azithromycin (ZITHROMAX) 250 MG tablet; 2 tablets first day and one tablet for the next 4 days.  Dispense: 6 tablet; Refill: 0  2. Nasal congestion - fluticasone (FLONASE) 50 MCG/ACT nasal spray; Place 2 sprays into both nostrils daily.  Dispense: 16 g; Refill: 6 - loratadine (CLARITIN) 10 MG tablet; Take 1 tablet (10 mg total) by mouth daily.  Dispense: 30 tablet; Refill: 1  3. COPD, moderate ([facility]) - albuterol (PROVENTIL) (2.5 MG/3ML) 0.083% nebulizer solution; Take 3 mLs (2.5 mg total) by nebulization every 6 (six) hours as needed for wheezing or shortness of breath.  Dispense: 75 mL; Refill: 1  4. Sinus pain - fluticasone (FLONASE) 50 MCG/ACT nasal spray; Place 2 sprays into both nostrils daily.  Dispense: 16 g; Refill: 6 - loratadine (CLARITIN) 10 MG tablet; Take 1 tablet (10 mg total) by mouth daily.  Dispense: 30 tablet; Refill: 1  5. Adventitious breath sounds -  azithromycin (ZITHROMAX) 250 MG tablet; 2 tablets first day and one tablet for the next 4 days.  Dispense: 6 tablet; Refill: 0

## 2018-02-08 ENCOUNTER — Other Ambulatory Visit: Payer: Self-pay

## 2018-02-08 ENCOUNTER — Ambulatory Visit: Payer: Medicare Other | Attending: Anesthesiology | Admitting: Anesthesiology

## 2018-02-08 ENCOUNTER — Encounter: Payer: Self-pay | Admitting: Anesthesiology

## 2018-02-08 VITALS — BP 151/100 | HR 95 | Temp 98.4°F | Resp 16 | Ht 64.0 in | Wt 250.0 lb

## 2018-02-08 DIAGNOSIS — M545 Low back pain, unspecified: Secondary | ICD-10-CM

## 2018-02-08 DIAGNOSIS — Z79891 Long term (current) use of opiate analgesic: Secondary | ICD-10-CM | POA: Insufficient documentation

## 2018-02-08 DIAGNOSIS — Z7951 Long term (current) use of inhaled steroids: Secondary | ICD-10-CM | POA: Insufficient documentation

## 2018-02-08 DIAGNOSIS — M542 Cervicalgia: Secondary | ICD-10-CM | POA: Diagnosis not present

## 2018-02-08 DIAGNOSIS — M47812 Spondylosis without myelopathy or radiculopathy, cervical region: Secondary | ICD-10-CM

## 2018-02-08 DIAGNOSIS — M47816 Spondylosis without myelopathy or radiculopathy, lumbar region: Secondary | ICD-10-CM | POA: Diagnosis not present

## 2018-02-08 DIAGNOSIS — M25551 Pain in right hip: Secondary | ICD-10-CM | POA: Diagnosis not present

## 2018-02-08 DIAGNOSIS — M25552 Pain in left hip: Secondary | ICD-10-CM | POA: Insufficient documentation

## 2018-02-08 DIAGNOSIS — M8949 Other hypertrophic osteoarthropathy, multiple sites: Secondary | ICD-10-CM

## 2018-02-08 DIAGNOSIS — M51369 Other intervertebral disc degeneration, lumbar region without mention of lumbar back pain or lower extremity pain: Secondary | ICD-10-CM

## 2018-02-08 DIAGNOSIS — M5136 Other intervertebral disc degeneration, lumbar region: Secondary | ICD-10-CM | POA: Diagnosis not present

## 2018-02-08 DIAGNOSIS — M549 Dorsalgia, unspecified: Secondary | ICD-10-CM | POA: Diagnosis present

## 2018-02-08 DIAGNOSIS — G8918 Other acute postprocedural pain: Secondary | ICD-10-CM

## 2018-02-08 DIAGNOSIS — M150 Primary generalized (osteo)arthritis: Secondary | ICD-10-CM

## 2018-02-08 DIAGNOSIS — F1190 Opioid use, unspecified, uncomplicated: Secondary | ICD-10-CM

## 2018-02-08 DIAGNOSIS — M159 Polyosteoarthritis, unspecified: Secondary | ICD-10-CM

## 2018-02-08 DIAGNOSIS — G894 Chronic pain syndrome: Secondary | ICD-10-CM | POA: Diagnosis not present

## 2018-02-08 DIAGNOSIS — Z842 Family history of other diseases of the genitourinary system: Secondary | ICD-10-CM

## 2018-02-08 DIAGNOSIS — Z79899 Other long term (current) drug therapy: Secondary | ICD-10-CM | POA: Diagnosis not present

## 2018-02-08 MED ORDER — OXYCODONE HCL 10 MG PO TABS: 10.0000 mg | ORAL_TABLET | ORAL | 0 refills | Status: DC

## 2018-02-08 NOTE — Progress Notes (Signed)
Nursing Pain Medication Assessment:  Safety precautions to be maintained throughout the outpatient stay will include: orient to surroundings, keep bed in low position, maintain call bell within reach at all times, provide assistance with transfer out of bed and ambulation.  Medication Inspection Compliance: Pill count conducted under aseptic conditions, in front of the patient. Neither the pills nor the bottle was removed from the patient's sight at any time. Once count was completed pills were immediately returned to the patient in their original bottle.  Medication: Oxycodone Pill/Patch Count: 91 of 120 pills remain Pill/Patch Appearance: Markings consistent with prescribed medication Bottle Appearance: Standard pharmacy container. Clearly labeled. Filled Date: 39 / 27 / 2019 Last Medication intake:  Today

## 2018-02-08 NOTE — Patient Instructions (Signed)
Two paper scripts for oxycodone given to patient.

## 2018-02-09 ENCOUNTER — Encounter: Payer: Self-pay | Admitting: Anesthesiology

## 2018-02-09 NOTE — Addendum Note (Signed)
Addended by: Molli Barrows on: 02/09/2018 03:47 PM   Modules accepted: Level of Service

## 2018-02-09 NOTE — Progress Notes (Signed)
Subjective:  Patient ID: Wendy Johnson, female    DOB: [birth year]  Age: 52 y.o. MRN: [id number]  CC: Back Pain   Procedure: None  HPI Wendy Johnson presents for reevaluation.  She was last seen 2 months ago and continues to have diffuse body pain as previously documented.  No significant changes are noted.  This primarily involves her hands back knees and neck.  The quality characteristic distribution of this remained stable in nature with no significant changes reported.  She still taking her medications as prescribed taking oxycodone 10 mg tablets 4 times a day with no untoward side effects mentioned.  I have reviewed her narcotic assessment sheet and she is doing well with this regimen.  She denies any diverting or illicit use.  She has recently had a hernia repair and is recovering from that.  She is also followed by Dr. Rudene Christians for her diffuse orthopedic problems.  Otherwise she is in her usual state of health at this point.  Outpatient Medications Prior to Visit  Medication Sig Dispense Refill  . albuterol (PROVENTIL) (2.5 MG/3ML) 0.083% nebulizer solution Take 3 mLs (2.5 mg total) by nebulization every 6 (six) hours as needed for wheezing or shortness of breath. 75 mL 1  . amphetamine-dextroamphetamine (ADDERALL) 30 MG tablet Take 30 mg by mouth 2 (two) times daily.    . ARIPiprazole (ABILIFY) 5 MG tablet Take 5 mg by mouth daily.     . busPIRone (BUSPAR) 30 MG tablet Take 30 mg by mouth 2 (two) times daily.     . clonazePAM (KLONOPIN) 1 MG tablet Take 2 mg by mouth 3 (three) times daily.    . cyclobenzaprine (FLEXERIL) 5 MG tablet Take 1 tablet (5 mg total) by mouth 3 (three) times daily as needed for muscle spasms. 30 tablet 0  . dexlansoprazole (DEXILANT) 60 MG capsule Take 1 capsule (60 mg total) by mouth daily. 30 capsule 5  . fluticasone (FLONASE) 50 MCG/ACT nasal spray Place 2 sprays into both nostrils daily. 16 g 6  . hydrOXYzine (ATARAX/VISTARIL) 25 MG tablet Take 25 mg by  mouth every 6 (six) hours as needed for anxiety or itching (hives).    . loratadine (CLARITIN) 10 MG tablet Take 1 tablet (10 mg total) by mouth daily. 30 tablet 1  . naloxone (NARCAN) nasal spray 4 mg/0.1 mL For respiratory suppression from opioids 1 kit 2  . promethazine-dextromethorphan (PROMETHAZINE-DM) 6.25-15 MG/5ML syrup Take 2.5 mLs by mouth 4 (four) times daily as needed for cough. 118 mL 0  . tiotropium (SPIRIVA HANDIHALER) 18 MCG inhalation capsule Place 1 capsule (18 mcg total) into inhaler and inhale daily. 30 capsule 5  . traZODone (DESYREL) 100 MG tablet Take 300 mg by mouth at bedtime.    . Oxycodone HCl 10 MG TABS Take 1 tablet (10 mg total) by mouth every 4 (four) hours while awake. 120 tablet 0  . azithromycin (ZITHROMAX) 250 MG tablet 2 tablets first day and one tablet for the next 4 days. (Patient not taking: Reported on 02/08/2018) 6 tablet 0  . Biotin 5 MG TABS Take 1 tablet by mouth daily.    . sertraline (ZOLOFT) 100 MG tablet Take 100 mg by mouth 2 (two) times daily.     No facility-administered medications prior to visit.     Review of Systems CNS: No confusion or sedation Cardiac: No angina or palpitations GI: No abdominal pain or constipation Constitutional: No nausea vomiting fevers or chills  Objective:  BP Marland Kitchen)  151/100   Pulse 95   Temp 98.4 F (36.9 C) (Oral)   Resp 16   Ht '5\' 4"'$  (1.626 m)   Wt 250 lb (113.4 kg)   LMP 09/26/2014 Comment: Total  SpO2 96%   BMI 42.91 kg/m    BP Readings from Last 3 Encounters:  02/08/18 (!) 151/100  02/02/18 136/70  01/22/18 132/84     Wt Readings from Last 3 Encounters:  02/08/18 250 lb (113.4 kg)  02/02/18 267 lb 1.6 oz (121.2 kg)  01/22/18 266 lb 12.8 oz (121 kg)     Physical Exam Pt is alert and oriented PERRL EOMI HEART IS RRR no murmur or rub LCTA no wheezing or rales MUSCULOSKELETAL reveals some paraspinous muscle tenderness in the lumbar spine.  She walks with a mildly antalgic gait but her  muscle tone and bulk to the lower extremities appears at baseline.  Labs  Lab Results  Component Value Date   HGBA1C 5.5 01/22/2018   HGBA1C 5.9 (H) 07/19/2017   HGBA1C 5.4 04/21/2017   Lab Results  Component Value Date   MICROALBUR 0.3 01/22/2018   LDLCALC 90 01/22/2018   CREATININE 0.64 01/22/2018    -------------------------------------------------------------------------------------------------------------------- Lab Results  Component Value Date   WBC 10.1 01/22/2018   HGB 14.7 01/22/2018   HCT 44.7 01/22/2018   PLT 308 01/22/2018   GLUCOSE 99 01/22/2018   CHOL 171 01/22/2018   TRIG 372 (H) 01/22/2018   HDL 31 (L) 01/22/2018   LDLCALC 90 01/22/2018   ALT 23 01/22/2018   AST 18 01/22/2018   NA 139 01/22/2018   K 4.1 01/22/2018   CL 103 01/22/2018   CREATININE 0.64 01/22/2018   BUN 11 01/22/2018   CO2 24 01/22/2018   TSH 0.769 02/03/2016   INR 0.97 10/04/2016   HGBA1C 5.5 01/22/2018   MICROALBUR 0.3 01/22/2018    --------------------------------------------------------------------------------------------------------------------- No results found.   Assessment & Plan:   Wendy Johnson was seen today for back pain.  Diagnoses and all orders for this visit:  Primary osteoarthritis involving multiple joints  Low back pain at multiple sites  Chronic, continuous use of opioids -     ToxASSURE Select 13 (MW), Urine  Facet arthritis of lumbar region  Chronic pain syndrome -     ToxASSURE Select 13 (MW), Urine  DDD (degenerative disc disease), lumbar  Facet arthritis of cervical region  Cervicalgia  Pain of both hip joints  Post-op pain -     Discontinue: Oxycodone HCl 10 MG TABS; Take 1 tablet (10 mg total) by mouth every 4 (four) hours while awake. -     Oxycodone HCl 10 MG TABS; Take 1 tablet (10 mg total) by mouth every 4 (four) hours while awake.  FH: TAH-BSO (total abdominal hysterectomy and bilateral salpingo-oophorectomy) -     Discontinue:  Oxycodone HCl 10 MG TABS; Take 1 tablet (10 mg total) by mouth every 4 (four) hours while awake. -     Oxycodone HCl 10 MG TABS; Take 1 tablet (10 mg total) by mouth every 4 (four) hours while awake.        ----------------------------------------------------------------------------------------------------------------------  Problem List Items Addressed This Visit      Unprioritized   DDD (degenerative disc disease), lumbar   Relevant Medications   Oxycodone HCl 10 MG TABS   Facet arthritis of lumbar region   Relevant Medications   Oxycodone HCl 10 MG TABS   Primary osteoarthritis involving multiple joints - Primary   Relevant Medications   Oxycodone  HCl 10 MG TABS    Other Visit Diagnoses    Low back pain at multiple sites       Relevant Medications   Oxycodone HCl 10 MG TABS   Chronic, continuous use of opioids       Relevant Orders   ToxASSURE Select 13 (MW), Urine   Chronic pain syndrome       Relevant Orders   ToxASSURE Select 13 (MW), Urine   Facet arthritis of cervical region       Relevant Medications   Oxycodone HCl 10 MG TABS   Cervicalgia       Pain of both hip joints       Post-op pain       Relevant Medications   Oxycodone HCl 10 MG TABS   FH: TAH-BSO (total abdominal hysterectomy and bilateral salpingo-oophorectomy)       Relevant Medications   Oxycodone HCl 10 MG TABS        ----------------------------------------------------------------------------------------------------------------------  1. Primary osteoarthritis involving multiple joints We will continue with efforts at weight loss as discussed with her.  In the meantime she is to continue with her current pain medication regimen.  Refills will be given for October 27 in November 26.  We have also requested a urine drug screen for follow-up.  He is scheduled for return to clinic in 2 months.  2. Low back pain at multiple sites Continue with core stretching strengthening exercises.  3.  Chronic, continuous use of opioids We have reviewed the [facility] practitioner database information and it is appropriate. - ToxASSURE Select 13 (MW), Urine  4. Facet arthritis of lumbar region As above  5. Chronic pain syndrome As above - ToxASSURE Select 13 (MW), Urine  6. DDD (degenerative disc disease), lumbar He has had slightly increased radiating pain into the left hip with low back pain.  I have scribed options to her regarding repeat epidural steroid administration for this to see if we can get her some relief.  7. Facet arthritis of cervical region As above  8. Cervicalgia   9. Pain of both hip joints   10. Post-op pain  - Oxycodone HCl 10 MG TABS; Take 1 tablet (10 mg total) by mouth every 4 (four) hours while awake.  Dispense: 120 tablet; Refill: 0  11. FH: TAH-BSO (total abdominal hysterectomy and bilateral salpingo-oophorectomy)  - Oxycodone HCl 10 MG TABS; Take 1 tablet (10 mg total) by mouth every 4 (four) hours while awake.  Dispense: 120 tablet; Refill: 0    ----------------------------------------------------------------------------------------------------------------------  I am having Wendy Putnam. Olshefski "[name]" maintain her amphetamine-dextroamphetamine, busPIRone, sertraline, hydrOXYzine, naloxone, clonazePAM, ARIPiprazole, traZODone, Biotin, cyclobenzaprine, tiotropium, dexlansoprazole, promethazine-dextromethorphan, fluticasone, loratadine, azithromycin, albuterol, and Oxycodone HCl.   Meds ordered this encounter  Medications  . DISCONTD: Oxycodone HCl 10 MG TABS    Sig: Take 1 tablet (10 mg total) by mouth every 4 (four) hours while awake.    Dispense:  120 tablet    Refill:  0    Do not fill until [id number]  . Oxycodone HCl 10 MG TABS    Sig: Take 1 tablet (10 mg total) by mouth every 4 (four) hours while awake.    Dispense:  120 tablet    Refill:  0    Do not fill until [id number]   Patient's Medications  New Prescriptions   No medications  on file  Previous Medications   ALBUTEROL (PROVENTIL) (2.5 MG/3ML) 0.083% NEBULIZER SOLUTION    Take 3 mLs (2.5 mg  total) by nebulization every 6 (six) hours as needed for wheezing or shortness of breath.   AMPHETAMINE-DEXTROAMPHETAMINE (ADDERALL) 30 MG TABLET    Take 30 mg by mouth 2 (two) times daily.   ARIPIPRAZOLE (ABILIFY) 5 MG TABLET    Take 5 mg by mouth daily.    AZITHROMYCIN (ZITHROMAX) 250 MG TABLET    2 tablets first day and one tablet for the next 4 days.   BIOTIN 5 MG TABS    Take 1 tablet by mouth daily.   BUSPIRONE (BUSPAR) 30 MG TABLET    Take 30 mg by mouth 2 (two) times daily.    CLONAZEPAM (KLONOPIN) 1 MG TABLET    Take 2 mg by mouth 3 (three) times daily.   CYCLOBENZAPRINE (FLEXERIL) 5 MG TABLET    Take 1 tablet (5 mg total) by mouth 3 (three) times daily as needed for muscle spasms.   DEXLANSOPRAZOLE (DEXILANT) 60 MG CAPSULE    Take 1 capsule (60 mg total) by mouth daily.   FLUTICASONE (FLONASE) 50 MCG/ACT NASAL SPRAY    Place 2 sprays into both nostrils daily.   HYDROXYZINE (ATARAX/VISTARIL) 25 MG TABLET    Take 25 mg by mouth every 6 (six) hours as needed for anxiety or itching (hives).   LORATADINE (CLARITIN) 10 MG TABLET    Take 1 tablet (10 mg total) by mouth daily.   NALOXONE (NARCAN) NASAL SPRAY 4 MG/0.1 ML    For respiratory suppression from opioids   PROMETHAZINE-DEXTROMETHORPHAN (PROMETHAZINE-DM) 6.25-15 MG/5ML SYRUP    Take 2.5 mLs by mouth 4 (four) times daily as needed for cough.   SERTRALINE (ZOLOFT) 100 MG TABLET    Take 100 mg by mouth 2 (two) times daily.   TIOTROPIUM (SPIRIVA HANDIHALER) 18 MCG INHALATION CAPSULE    Place 1 capsule (18 mcg total) into inhaler and inhale daily.   TRAZODONE (DESYREL) 100 MG TABLET    Take 300 mg by mouth at bedtime.  Modified Medications   Modified Medication Previous Medication   OXYCODONE HCL 10 MG TABS Oxycodone HCl 10 MG TABS      Take 1 tablet (10 mg total) by mouth every 4 (four) hours while awake.    Take 1 tablet (10  mg total) by mouth every 4 (four) hours while awake.  Discontinued Medications   No medications on file   ----------------------------------------------------------------------------------------------------------------------  Follow-up: No follow-ups on file.    Molli Barrows, MD

## 2018-02-11 ENCOUNTER — Ambulatory Visit
Admission: RE | Admit: 2018-02-11 | Discharge: 2018-02-11 | Disposition: A | Discharge status: Home / Self Care | Payer: Medicare Other | Source: Ambulatory Visit | Attending: Orthopedic Surgery | Admitting: Orthopedic Surgery

## 2018-02-11 DIAGNOSIS — M4807 Spinal stenosis, lumbosacral region: Secondary | ICD-10-CM | POA: Diagnosis present

## 2018-02-11 DIAGNOSIS — M5136 Other intervertebral disc degeneration, lumbar region: Secondary | ICD-10-CM | POA: Insufficient documentation

## 2018-02-11 DIAGNOSIS — M5442 Lumbago with sciatica, left side: Secondary | ICD-10-CM | POA: Insufficient documentation

## 2018-02-11 DIAGNOSIS — M47816 Spondylosis without myelopathy or radiculopathy, lumbar region: Secondary | ICD-10-CM | POA: Diagnosis not present

## 2018-02-15 LAB — TOXASSURE SELECT 13 (MW), URINE

## 2018-02-27 ENCOUNTER — Telehealth: Payer: Self-pay

## 2018-02-27 NOTE — Telephone Encounter (Addendum)
Spoke with pharmacist to verify date of last refill.  Oxycodone was filled last on 02-02-18 and is due to be filled again on 03-04-18 which is a Sunday and the [facility] is closed on Sunday therefore I gave permission for it to be filled on Saturday 03-03-18. Patient notified.

## 2018-02-27 NOTE — Telephone Encounter (Signed)
Pt called and her script is due to be filled Sunday and her pharmacy is closed can we call Pharmacy and auth to fill Saturday(day Early)

## 2018-03-02 ENCOUNTER — Other Ambulatory Visit: Payer: Self-pay | Admitting: Family Medicine

## 2018-03-02 ENCOUNTER — Telehealth: Payer: Self-pay

## 2018-03-02 DIAGNOSIS — M2550 Pain in unspecified joint: Secondary | ICD-10-CM

## 2018-03-02 MED ORDER — IBUPROFEN 800 MG PO TABS: 800.0000 mg | ORAL_TABLET | Freq: Three times a day (TID) | ORAL | 0 refills | Status: DC | PRN

## 2018-03-02 NOTE — Telephone Encounter (Signed)
Refill request was sent to Dr. Krichna Sowles for approval and submission.  

## 2018-03-02 NOTE — Telephone Encounter (Signed)
Copied from [place] [id number]. Topic: Quick Communication - Rx Refill/Question >> Mar 02, 2018  8:18 AM Celedonio Savage L wrote: Medication: ibuprofen (ADVIL,MOTRIN) 800 MG tablet  For back pain Has the patient contacted their pharmacy? No. (Agent: If no, request that the patient contact the pharmacy for the refill.) (Agent: If yes, when and what did the pharmacy advise?) has expired   Preferred Pharmacy (with phone number or street name):   [place], [place] - [address] [phone] (Phone) [phone] (Fax)    Agent: Please be advised that RX refills may take up to 3 business days. We ask that you follow-up with your pharmacy.

## 2018-03-02 NOTE — Telephone Encounter (Signed)
Routing to [facility] Pt with refill request

## 2018-03-02 NOTE — Telephone Encounter (Signed)
Copied from [place] [id number]. Topic: Referral - Request for Referral >> Mar 02, 2018 11:12 AM Carolyn Stare wrote: Has patient seen PCP for this complaint yes    Referral for which specialty  rheumatology Preferred provider   [facility]   Reason for referral  rheumatoid arthritis

## 2018-03-19 DIAGNOSIS — G894 Chronic pain syndrome: Secondary | ICD-10-CM | POA: Diagnosis not present

## 2018-03-19 DIAGNOSIS — R768 Other specified abnormal immunological findings in serum: Secondary | ICD-10-CM | POA: Diagnosis not present

## 2018-03-19 DIAGNOSIS — M47816 Spondylosis without myelopathy or radiculopathy, lumbar region: Secondary | ICD-10-CM | POA: Diagnosis not present

## 2018-03-19 DIAGNOSIS — M159 Polyosteoarthritis, unspecified: Secondary | ICD-10-CM | POA: Diagnosis not present

## 2018-03-19 DIAGNOSIS — M2550 Pain in unspecified joint: Secondary | ICD-10-CM | POA: Diagnosis not present

## 2018-03-27 ENCOUNTER — Other Ambulatory Visit: Payer: Self-pay

## 2018-03-27 NOTE — Patient Outreach (Signed)
[facility]) Care Management  03/27/2018  Will Schier [facility] 27-Mar-1966 [id number]   Medication Adherence call to Mrs. Deshea Locy left a message for patient to call back patient is due on Rosuvastatin 20 mg under [place].   [place] Management Direct Dial [phone]  Fax [phone] [email or web address]

## 2018-03-29 DIAGNOSIS — G4733 Obstructive sleep apnea (adult) (pediatric): Secondary | ICD-10-CM | POA: Diagnosis not present

## 2018-03-29 DIAGNOSIS — J449 Chronic obstructive pulmonary disease, unspecified: Secondary | ICD-10-CM | POA: Diagnosis not present

## 2018-04-09 ENCOUNTER — Other Ambulatory Visit: Payer: Self-pay | Admitting: Family Medicine

## 2018-04-24 ENCOUNTER — Encounter: Payer: Self-pay | Admitting: Anesthesiology

## 2018-04-24 ENCOUNTER — Ambulatory Visit: Payer: Medicare Other | Attending: Anesthesiology | Admitting: Anesthesiology

## 2018-04-24 VITALS — Ht 64.0 in | Wt 260.0 lb

## 2018-04-24 DIAGNOSIS — Z79899 Other long term (current) drug therapy: Secondary | ICD-10-CM | POA: Diagnosis not present

## 2018-04-24 DIAGNOSIS — M5432 Sciatica, left side: Secondary | ICD-10-CM

## 2018-04-24 DIAGNOSIS — M5136 Other intervertebral disc degeneration, lumbar region: Secondary | ICD-10-CM | POA: Diagnosis not present

## 2018-04-24 DIAGNOSIS — F1190 Opioid use, unspecified, uncomplicated: Secondary | ICD-10-CM

## 2018-04-24 DIAGNOSIS — M47816 Spondylosis without myelopathy or radiculopathy, lumbar region: Secondary | ICD-10-CM | POA: Diagnosis not present

## 2018-04-24 DIAGNOSIS — M545 Low back pain, unspecified: Secondary | ICD-10-CM

## 2018-04-24 DIAGNOSIS — M542 Cervicalgia: Secondary | ICD-10-CM | POA: Diagnosis not present

## 2018-04-24 DIAGNOSIS — R899 Unspecified abnormal finding in specimens from other organs, systems and tissues: Secondary | ICD-10-CM | POA: Diagnosis not present

## 2018-04-24 DIAGNOSIS — M47812 Spondylosis without myelopathy or radiculopathy, cervical region: Secondary | ICD-10-CM

## 2018-04-24 DIAGNOSIS — M1991 Primary osteoarthritis, unspecified site: Secondary | ICD-10-CM | POA: Insufficient documentation

## 2018-04-24 DIAGNOSIS — Z79891 Long term (current) use of opiate analgesic: Secondary | ICD-10-CM | POA: Insufficient documentation

## 2018-04-24 DIAGNOSIS — Z7951 Long term (current) use of inhaled steroids: Secondary | ICD-10-CM | POA: Diagnosis not present

## 2018-04-24 DIAGNOSIS — M549 Dorsalgia, unspecified: Secondary | ICD-10-CM | POA: Diagnosis present

## 2018-04-24 DIAGNOSIS — G8918 Other acute postprocedural pain: Secondary | ICD-10-CM | POA: Diagnosis not present

## 2018-04-24 DIAGNOSIS — M8949 Other hypertrophic osteoarthropathy, multiple sites: Secondary | ICD-10-CM

## 2018-04-24 DIAGNOSIS — G894 Chronic pain syndrome: Secondary | ICD-10-CM | POA: Insufficient documentation

## 2018-04-24 DIAGNOSIS — M5442 Lumbago with sciatica, left side: Secondary | ICD-10-CM | POA: Diagnosis not present

## 2018-04-24 DIAGNOSIS — M150 Primary generalized (osteo)arthritis: Secondary | ICD-10-CM

## 2018-04-24 DIAGNOSIS — M159 Polyosteoarthritis, unspecified: Secondary | ICD-10-CM

## 2018-04-24 DIAGNOSIS — Z791 Long term (current) use of non-steroidal anti-inflammatories (NSAID): Secondary | ICD-10-CM | POA: Diagnosis not present

## 2018-04-24 DIAGNOSIS — Z842 Family history of other diseases of the genitourinary system: Secondary | ICD-10-CM

## 2018-04-24 DIAGNOSIS — M5386 Other specified dorsopathies, lumbar region: Secondary | ICD-10-CM

## 2018-04-24 MED ORDER — OXYCODONE HCL 10 MG PO TABS: 10.0000 mg | ORAL_TABLET | ORAL | 0 refills | Status: DC

## 2018-04-24 NOTE — Patient Instructions (Signed)
You have been given 2 rx for Oxycodone to last until 06/25/2018.   Epidural Steroid Injection Patient Information  Description: The epidural space surrounds the nerves as they exit the spinal cord.  In some patients, the nerves can be compressed and inflamed by a bulging disc or a tight spinal canal (spinal stenosis).  By injecting steroids into the epidural space, we can bring irritated nerves into direct contact with a potentially helpful medication.  These steroids act directly on the irritated nerves and can reduce swelling and inflammation which often leads to decreased pain.  Epidural steroids may be injected anywhere along the spine and from the neck to the low back depending upon the location of your pain.   After numbing the skin with local anesthetic (like Novocaine), a small needle is passed into the epidural space slowly.  You may experience a sensation of pressure while this is being done.  The entire block usually last less than 10 minutes.  Conditions which may be treated by epidural steroids:   Low back and leg pain  Neck and arm pain  Spinal stenosis  Post-laminectomy syndrome  Herpes zoster (shingles) pain  Pain from compression fractures  Preparation for the injection:  1. Do not eat any solid food or dairy products within 8 hours of your appointment.  2. You may drink clear liquids up to 3 hours before appointment.  Clear liquids include water, black coffee, juice or soda.  No milk or cream please. 3. You may take your regular medication, including pain medications, with a sip of water before your appointment  Diabetics should hold regular insulin (if taken separately) and take 1/2 normal NPH dos the morning of the procedure.  Carry some sugar containing items with you to your appointment. 4. A driver must accompany you and be prepared to drive you home after your procedure.  5. Bring all your current medications with your. 6. An IV may be inserted and sedation may  be given at the discretion of the physician.   7. A blood pressure cuff, EKG and other monitors will often be applied during the procedure.  Some patients may need to have extra oxygen administered for a short period. 8. You will be asked to provide medical information, including your allergies, prior to the procedure.  We must know immediately if you are taking blood thinners (like Coumadin/Warfarin)  Or if you are allergic to IV iodine contrast (dye). We must know if you could possible be pregnant.  Possible side-effects:  Bleeding from needle site  Infection (rare, may require surgery)  Nerve injury (rare)  Numbness & tingling (temporary)  Difficulty urinating (rare, temporary)  Spinal headache ( a headache worse with upright posture)  Light -headedness (temporary)  Pain at injection site (several days)  Decreased blood pressure (temporary)  Weakness in arm/leg (temporary)  Pressure sensation in back/neck (temporary)  Call if you experience:  Fever/chills associated with headache or increased back/neck pain.  Headache worsened by an upright position.  New onset weakness or numbness of an extremity below the injection site  Hives or difficulty breathing (go to the emergency room)  Inflammation or drainage at the infection site  Severe back/neck pain  Any new symptoms which are concerning to you  Please note:  Although the local anesthetic injected can often make your back or neck feel good for several hours after the injection, the pain will likely return.  It takes 3-7 days for steroids to work in the epidural space.  You may not notice any pain relief for at least that one week.  If effective, we will often do a series of three injections spaced 3-6 weeks apart to maximally decrease your pain.  After the initial series, we generally will wait several months before considering a repeat injection of the same type.  If you have any questions, please call [phone]  [phone] [facility]___________________________________________________________________________________________  Preparing for Procedure with Sedation  Instructions: . Oral Intake: Do not eat or drink anything for at least 8 hours prior to your procedure. . Transportation: Public transportation is not allowed. Bring an adult driver. The driver must be physically present in our waiting room before any procedure can be started. Marland Kitchen Physical Assistance: Bring an adult physically capable of assisting you, in the event you need help. This adult should keep you company at home for at least 6 hours after the procedure. . Blood Pressure Medicine: Take your blood pressure medicine with a sip of water the morning of the procedure. . Blood thinners: Notify our staff if you are taking any blood thinners. Depending on which one you take, there will be specific instructions on how and when to stop it. . Diabetics on insulin: Notify the staff so that you can be scheduled 1st case in the morning. If your diabetes requires high dose insulin, take only  of your normal insulin dose the morning of the procedure and notify the staff that you have done so. . Preventing infections: Shower with an antibacterial soap the morning of your procedure. . Build-up your immune system: Take 1000 mg of Vitamin C with every meal (3 times a day) the day prior to your procedure. Marland Kitchen Antibiotics: Inform the staff if you have a condition or reason that requires you to take antibiotics before dental procedures. . Pregnancy: If you are pregnant, call and cancel the procedure. . Sickness: If you have a cold, fever, or any active infections, call and cancel the procedure. . Arrival: You must be in the facility at least 30 minutes prior to your scheduled procedure. . Children: Do not bring children with you. . Dress appropriately: Bring dark clothing that you would not mind if they get stained. . Valuables: Do  not bring any jewelry or valuables.  Procedure appointments are reserved for interventional treatments only. Marland Kitchen No Prescription Refills. . No medication changes will be discussed during procedure appointments. . No disability issues will be discussed.  Reasons to call and reschedule or cancel your procedure: (Following these recommendations will minimize the risk of a serious complication.) . Surgeries: Avoid having procedures within 2 weeks of any surgery. (Avoid for 2 weeks before or after any surgery). . Flu Shots: Avoid having procedures within 2 weeks of a flu shots or . (Avoid for 2 weeks before or after immunizations). . Barium: Avoid having a procedure within 7-10 days after having had a radiological study involving the use of radiological contrast. (Myelograms, Barium swallow or enema study). . Heart attacks: Avoid any elective procedures or surgeries for the initial 6 months after a "Myocardial Infarction" (Heart Attack). . Blood thinners: It is imperative that you stop these medications before procedures. Let us know if you if you take any blood thinner.  . Infection: Avoid procedures during or within two weeks of an infection (including chest colds or gastrointestinal problems). Symptoms associated with infections include: Localized redness, fever, chills, night sweats or profuse sweating, burning sensation when voiding, cough, congestion, stuffiness, runny nose, sore throat, diarrhea,  nausea, vomiting, cold or Flu symptoms, recent or current infections. It is specially important if the infection is over the area that we intend to treat. Marland Kitchen Heart and lung problems: Symptoms that may suggest an active cardiopulmonary problem include: cough, chest pain, breathing difficulties or shortness of breath, dizziness, ankle swelling, uncontrolled high or unusually low blood pressure, and/or palpitations. If you are experiencing any of these symptoms, cancel your procedure and contact your primary care  physician for an evaluation.  Remember:  Regular Business hours are:  Monday to Thursday 8:00 AM to 4:00 PM  Provider's Schedule: Milinda Pointer, MD:  Procedure days: Tuesday and Thursday 7:30 AM to 4:00 PM  Gillis Santa, MD:  Procedure days: Monday and Wednesday 7:30 AM to 4:00 PM ____________________________________________________________________________________________

## 2018-04-24 NOTE — Progress Notes (Signed)
Nursing Pain Medication Assessment:  Safety precautions to be maintained throughout the outpatient stay will include: orient to surroundings, keep bed in low position, maintain call bell within reach at all times, provide assistance with transfer out of bed and ambulation.  Medication Inspection Compliance: Pill count conducted under aseptic conditions, in front of the patient. Neither the pills nor the bottle was removed from the patient's sight at any time. Once count was completed pills were immediately returned to the patient in their original bottle.  Medication: Oxycodone IR Pill/Patch Count: 6 of 120 pills remain Pill/Patch Appearance: Markings consistent with prescribed medication Bottle Appearance: Standard pharmacy container. Clearly labeled. Filled Date: 36 / 26 / 2019 Last Medication intake:  Today

## 2018-04-25 NOTE — Progress Notes (Signed)
Subjective:  Patient ID: Wendy Johnson, female    DOB: 1965-09-12  Age: 52 y.o. MRN: [id number]  CC: Back Pain (lower worse on the left )   Procedure: None  HPI Wendy Johnson presents for reevaluation.  She was last seen 3 months ago and has continued to have diffuse body pain of the same quality characteristic and distribution.  Secondary to severity she has increased her utilization of the oxycodone.  She is generally taking this approximately 5 times per day.  She is running short at this visit.  She denies any side effects with the medication but does admit that it helps her with pain relief.  Her pain is consistent with what she has experienced in the past.  No new changes are noted in upper extremity strength or function or lower extremity strength function bowel bladder function etc.  Areas of pain are similar in nature but alleviated till significant extent with the medications.  Based on her narcotic assessment sheet she is getting good relief with the medicines with no significant side effects noted.  At present she is taking oxycodone tablets baseline 4 times a day and recently up to 5 times a day.  She generally takes 2 in the morning 2 in the afternoon or spaces them out for an every 4-5 hour range.  She has been on the medications 5 times a day in the past and tolerated this without difficulty.  She has denied any diverting or illicit use and has been.  On her UDS.  Present she is scheduled for an epidural steroid with an outlying rheumatology clinic.  Outpatient Medications Prior to Visit  Medication Sig Dispense Refill  . albuterol (PROVENTIL) (2.5 MG/3ML) 0.083% nebulizer solution Take 3 mLs (2.5 mg total) by nebulization every 6 (six) hours as needed for wheezing or shortness of breath. 75 mL 1  . amphetamine-dextroamphetamine (ADDERALL) 30 MG tablet Take 30 mg by mouth 2 (two) times daily.    . ARIPiprazole (ABILIFY) 5 MG tablet Take 5 mg by mouth daily.     . Biotin 5 MG TABS  Take 1 tablet by mouth daily.    . busPIRone (BUSPAR) 30 MG tablet Take 30 mg by mouth 2 (two) times daily.     . clonazePAM (KLONOPIN) 1 MG tablet Take 2 mg by mouth 3 (three) times daily.    Marland Kitchen dexlansoprazole (DEXILANT) 60 MG capsule Take 1 capsule (60 mg total) by mouth daily. 30 capsule 5  . hydrOXYzine (ATARAX/VISTARIL) 25 MG tablet Take 25 mg by mouth every 6 (six) hours as needed for anxiety or itching (hives).    Marland Kitchen ibuprofen (ADVIL,MOTRIN) 800 MG tablet Take 1 tablet (800 mg total) by mouth every 8 (eight) hours as needed. 30 tablet 0  . naloxone (NARCAN) nasal spray 4 mg/0.1 mL For respiratory suppression from opioids 1 kit 2  . PROAIR HFA 108 (90 Base) MCG/ACT inhaler TWO PUFFS FOUR TIMES A DAY 8.5 g 0  . sertraline (ZOLOFT) 100 MG tablet Take 100 mg by mouth 2 (two) times daily.    Marland Kitchen tiotropium (SPIRIVA HANDIHALER) 18 MCG inhalation capsule Place 1 capsule (18 mcg total) into inhaler and inhale daily. 30 capsule 5  . traZODone (DESYREL) 100 MG tablet Take 300 mg by mouth at bedtime.    . Oxycodone HCl 10 MG TABS Take 1 tablet (10 mg total) by mouth every 4 (four) hours while awake. 120 tablet 0  . azithromycin (ZITHROMAX) 250 MG tablet 2 tablets  first day and one tablet for the next 4 days. (Patient not taking: Reported on 02/08/2018) 6 tablet 0  . cyclobenzaprine (FLEXERIL) 5 MG tablet Take 1 tablet (5 mg total) by mouth 3 (three) times daily as needed for muscle spasms. (Patient not taking: Reported on 04/24/2018) 30 tablet 0  . fluticasone (FLONASE) 50 MCG/ACT nasal spray Place 2 sprays into both nostrils daily. (Patient not taking: Reported on 04/24/2018) 16 g 6  . loratadine (CLARITIN) 10 MG tablet Take 1 tablet (10 mg total) by mouth daily. (Patient not taking: Reported on 04/24/2018) 30 tablet 1  . promethazine-dextromethorphan (PROMETHAZINE-DM) 6.25-15 MG/5ML syrup Take 2.5 mLs by mouth 4 (four) times daily as needed for cough. (Patient not taking: Reported on 04/24/2018) 118 mL 0    No facility-administered medications prior to visit.     Review of Systems CNS: No confusion or sedation Cardiac: No angina or palpitations GI: No abdominal pain or constipation Constitutional: No nausea vomiting fevers or chills  Objective:  Ht _0  (1.626 m)   Wt 260 lb (117.9 kg)   LMP 09/26/2014 Comment: Total  BMI 44.63 kg/m    BP Readings from Last 3 Encounters:  02/08/18 (!) 151/100  02/02/18 136/70  01/22/18 132/84     Wt Readings from Last 3 Encounters:  04/24/18 260 lb (117.9 kg)  02/08/18 250 lb (113.4 kg)  02/02/18 267 lb 1.6 oz (121.2 kg)     Physical Exam Pt is alert and oriented PERRL EOMI HEART IS RRR no murmur or rub LCTA no wheezing or rales MUSCULOSKELETAL reveals some paraspinous muscle tenderness in the low back.  She does have a positive straight leg raise on the left side.  Negative on the right.  Her muscle tone and bulk is good she is ambulating with an antalgic gait.  Otherwise no changes are noted.  Labs  Lab Results  Component Value Date   HGBA1C 5.5 01/22/2018   HGBA1C 5.9 (H) 07/19/2017   HGBA1C 5.4 04/21/2017   Lab Results  Component Value Date   MICROALBUR 0.3 01/22/2018   LDLCALC 90 01/22/2018   CREATININE 0.64 01/22/2018    -------------------------------------------------------------------------------------------------------------------- Lab Results  Component Value Date   WBC 10.1 01/22/2018   HGB 14.7 01/22/2018   HCT 44.7 01/22/2018   PLT 308 01/22/2018   GLUCOSE 99 01/22/2018   CHOL 171 01/22/2018   TRIG 372 (H) 01/22/2018   HDL 31 (L) 01/22/2018   LDLCALC 90 01/22/2018   ALT 23 01/22/2018   AST 18 01/22/2018   NA 139 01/22/2018   K 4.1 01/22/2018   CL 103 01/22/2018   CREATININE 0.64 01/22/2018   BUN 11 01/22/2018   CO2 24 01/22/2018   TSH 0.769 02/03/2016   INR 0.97 10/04/2016   HGBA1C 5.5 01/22/2018   MICROALBUR 0.3 01/22/2018     --------------------------------------------------------------------------------------------------------------------- Mr Lumbar Spine Wo Contrast  Result Date: 02/11/2018 CLINICAL DATA:  Low back and left hip and leg pain for 1 month. No known injury. EXAM: MRI LUMBAR SPINE WITHOUT CONTRAST TECHNIQUE: Multiplanar, multisequence MR imaging of the lumbar spine was performed. No intravenous contrast was administered. COMPARISON:  CT abdomen and pelvis 09/02/2016. FINDINGS: Segmentation:  Standard. Alignment:  Maintained. Vertebrae:  Height and signal are normal. Conus medullaris and cauda equina: Conus extends to the T12. level. Conus and cauda equina appear normal. Paraspinal and other soft tissues: Negative. Disc levels: T10-11 and T11-12 are imaged in the sagittal plane only and negative. T12-L1: Negative. L1-2: Negative. L2-3: Negative. L3-4:  Mild to moderate facet degenerative change. No bulge or protrusion. The central canal and foramina are open. L4-5: Negative. L5-S1: Negative. IMPRESSION: Negative for central canal or foraminal narrowing. No disc protrusion is identified. Mild to moderate facet degenerative disease L3-4. Electronically Signed   By: Inge Rise M.D.   On: 02/11/2018 11:15     Assessment & Plan:   Wendy Johnson was seen today for back pain.  Diagnoses and all orders for this visit:  Primary osteoarthritis involving multiple joints  Low back pain at multiple sites  Chronic, continuous use of opioids  Facet arthritis of lumbar region  Chronic pain syndrome  DDD (degenerative disc disease), lumbar -     Lumbar Epidural Injection; Future  Facet arthritis of cervical region  Cervicalgia  Low back derangement syndrome  Post-op pain -     Discontinue: Oxycodone HCl 10 MG TABS; Take 1 tablet (10 mg total) by mouth every 4 (four) hours while awake. -     Oxycodone HCl 10 MG TABS; Take 1 tablet (10 mg total) by mouth every 4 (four) hours while awake.  FH: TAH-BSO  (total abdominal hysterectomy and bilateral salpingo-oophorectomy) -     Discontinue: Oxycodone HCl 10 MG TABS; Take 1 tablet (10 mg total) by mouth every 4 (four) hours while awake. -     Oxycodone HCl 10 MG TABS; Take 1 tablet (10 mg total) by mouth every 4 (four) hours while awake.  Sciatica of left side -     Lumbar Epidural Injection; Future        ----------------------------------------------------------------------------------------------------------------------  Problem List Items Addressed This Visit      Unprioritized   DDD (degenerative disc disease), lumbar   Relevant Medications   Oxycodone HCl 10 MG TABS (Start on 05/25/2018)   Other Relevant Orders   Lumbar Epidural Injection   Facet arthritis of lumbar region   Relevant Medications   Oxycodone HCl 10 MG TABS (Start on 05/25/2018)   Low back derangement syndrome   Relevant Medications   Oxycodone HCl 10 MG TABS (Start on 05/25/2018)   Primary osteoarthritis involving multiple joints - Primary   Relevant Medications   Oxycodone HCl 10 MG TABS (Start on 05/25/2018)    Other Visit Diagnoses    Low back pain at multiple sites       Relevant Medications   Oxycodone HCl 10 MG TABS (Start on 05/25/2018)   Chronic, continuous use of opioids       Chronic pain syndrome       Facet arthritis of cervical region       Relevant Medications   Oxycodone HCl 10 MG TABS (Start on 05/25/2018)   Cervicalgia       Post-op pain       Relevant Medications   Oxycodone HCl 10 MG TABS (Start on 05/25/2018)   FH: TAH-BSO (total abdominal hysterectomy and bilateral salpingo-oophorectomy)       Relevant Medications   Oxycodone HCl 10 MG TABS (Start on 05/25/2018)   Sciatica of left side       Relevant Orders   Lumbar Epidural Injection        ----------------------------------------------------------------------------------------------------------------------  1. Primary osteoarthritis involving multiple joints We will keep her  on her current regimen but I am going to bump this to 150 tablets/month for the next 2 months and then wean her back to 120 her baseline.  We have gone over the risks and benefits of chronic opioid therapy once again and she understands these.  She  desires to comply with our current policy.  2. Low back pain at multiple sites As above  3. Chronic, continuous use of opioids As above and we have reviewed the [facility] practitioner database information and it is appropriate.  4. Facet arthritis of lumbar region As above  5. Chronic pain syndrome As above  6. DDD (degenerative disc disease), lumbar We will schedule her for an epidural at her next visit.  She will determine whether she wants to have this done as previously established with her rheumatologist which is fine or we would be happy to assist her if that simplifies things. - Lumbar Epidural Injection; Future  7. Facet arthritis of cervical region As above  8. Cervicalgia To new core stretching strengthening  9. Low back derangement syndrome   10. Post-op pain  - Oxycodone HCl 10 MG TABS; Take 1 tablet (10 mg total) by mouth every 4 (four) hours while awake.  Dispense: 150 tablet; Refill: 0  11. FH: TAH-BSO (total abdominal hysterectomy and bilateral salpingo-oophorectomy)  - Oxycodone HCl 10 MG TABS; Take 1 tablet (10 mg total) by mouth every 4 (four) hours while awake.  Dispense: 150 tablet; Refill: 0  12. Sciatica of left side  - Lumbar Epidural Injection; Future    ----------------------------------------------------------------------------------------------------------------------  I am having Wendy Johnson. Hiraldo "[name]" maintain her amphetamine-dextroamphetamine, busPIRone, sertraline, hydrOXYzine, naloxone, clonazePAM, ARIPiprazole, traZODone, Biotin, cyclobenzaprine, tiotropium, dexlansoprazole, promethazine-dextromethorphan, fluticasone, loratadine, azithromycin, albuterol, ibuprofen, PROAIR HFA, and Oxycodone  HCl.   Meds ordered this encounter  Medications  . DISCONTD: Oxycodone HCl 10 MG TABS    Sig: Take 1 tablet (10 mg total) by mouth every 4 (four) hours while awake.    Dispense:  150 tablet    Refill:  0    Do not fill until [id number]  . Oxycodone HCl 10 MG TABS    Sig: Take 1 tablet (10 mg total) by mouth every 4 (four) hours while awake.    Dispense:  150 tablet    Refill:  0    Do not fill until [id number]   Patient's Medications  New Prescriptions   No medications on file  Previous Medications   ALBUTEROL (PROVENTIL) (2.5 MG/3ML) 0.083% NEBULIZER SOLUTION    Take 3 mLs (2.5 mg total) by nebulization every 6 (six) hours as needed for wheezing or shortness of breath.   AMPHETAMINE-DEXTROAMPHETAMINE (ADDERALL) 30 MG TABLET    Take 30 mg by mouth 2 (two) times daily.   ARIPIPRAZOLE (ABILIFY) 5 MG TABLET    Take 5 mg by mouth daily.    AZITHROMYCIN (ZITHROMAX) 250 MG TABLET    2 tablets first day and one tablet for the next 4 days.   BIOTIN 5 MG TABS    Take 1 tablet by mouth daily.   BUSPIRONE (BUSPAR) 30 MG TABLET    Take 30 mg by mouth 2 (two) times daily.    CLONAZEPAM (KLONOPIN) 1 MG TABLET    Take 2 mg by mouth 3 (three) times daily.   CYCLOBENZAPRINE (FLEXERIL) 5 MG TABLET    Take 1 tablet (5 mg total) by mouth 3 (three) times daily as needed for muscle spasms.   DEXLANSOPRAZOLE (DEXILANT) 60 MG CAPSULE    Take 1 capsule (60 mg total) by mouth daily.   FLUTICASONE (FLONASE) 50 MCG/ACT NASAL SPRAY    Place 2 sprays into both nostrils daily.   HYDROXYZINE (ATARAX/VISTARIL) 25 MG TABLET    Take 25 mg by mouth every 6 (six) hours as needed for  anxiety or itching (hives).   IBUPROFEN (ADVIL,MOTRIN) 800 MG TABLET    Take 1 tablet (800 mg total) by mouth every 8 (eight) hours as needed.   LORATADINE (CLARITIN) 10 MG TABLET    Take 1 tablet (10 mg total) by mouth daily.   NALOXONE (NARCAN) NASAL SPRAY 4 MG/0.1 ML    For respiratory suppression from opioids   PROAIR HFA 108 (90 BASE)  MCG/ACT INHALER    TWO PUFFS FOUR TIMES A DAY   PROMETHAZINE-DEXTROMETHORPHAN (PROMETHAZINE-DM) 6.25-15 MG/5ML SYRUP    Take 2.5 mLs by mouth 4 (four) times daily as needed for cough.   SERTRALINE (ZOLOFT) 100 MG TABLET    Take 100 mg by mouth 2 (two) times daily.   TIOTROPIUM (SPIRIVA HANDIHALER) 18 MCG INHALATION CAPSULE    Place 1 capsule (18 mcg total) into inhaler and inhale daily.   TRAZODONE (DESYREL) 100 MG TABLET    Take 300 mg by mouth at bedtime.  Modified Medications   Modified Medication Previous Medication   OXYCODONE HCL 10 MG TABS Oxycodone HCl 10 MG TABS      Take 1 tablet (10 mg total) by mouth every 4 (four) hours while awake.    Take 1 tablet (10 mg total) by mouth every 4 (four) hours while awake.  Discontinued Medications   No medications on file   ----------------------------------------------------------------------------------------------------------------------  Follow-up: Return in about 2 months (around 06/25/2018) for evaluation, med refill.    Molli Barrows, MD

## 2018-04-26 ENCOUNTER — Other Ambulatory Visit: Payer: Self-pay | Admitting: Anesthesiology

## 2018-04-26 DIAGNOSIS — R899 Unspecified abnormal finding in specimens from other organs, systems and tissues: Secondary | ICD-10-CM | POA: Diagnosis not present

## 2018-04-28 DIAGNOSIS — G4733 Obstructive sleep apnea (adult) (pediatric): Secondary | ICD-10-CM | POA: Diagnosis not present

## 2018-04-28 DIAGNOSIS — J449 Chronic obstructive pulmonary disease, unspecified: Secondary | ICD-10-CM | POA: Diagnosis not present

## 2018-05-18 DIAGNOSIS — M7061 Trochanteric bursitis, right hip: Secondary | ICD-10-CM | POA: Diagnosis not present

## 2018-05-18 DIAGNOSIS — M7062 Trochanteric bursitis, left hip: Secondary | ICD-10-CM | POA: Diagnosis not present

## 2018-05-23 ENCOUNTER — Other Ambulatory Visit: Payer: Self-pay

## 2018-05-23 MED ORDER — IBUPROFEN 800 MG PO TABS: 800.0000 mg | ORAL_TABLET | Freq: Three times a day (TID) | ORAL | 0 refills | Status: DC | PRN

## 2018-05-23 NOTE — Telephone Encounter (Signed)
Copied from [place] [id number]. Topic: Quick Communication - Rx Refill/Question >> May 23, 2018 12:58 PM Carolyn Stare wrote: Medication   ibuprofen (ADVIL,MOTRIN) 800 MG tablet    pt said she out and has not had any since Oct 2019   [place]   Agent: Please be advised that RX refills may take up to 3 business days. We ask that you follow-up with your pharmacy.   Refill request for general medication: Ibuprofen 800 mg  Last office visit: 02/02/2018  Last physical exam: 07/07/2017  Follow-ups on file. 07/10/2018

## 2018-05-29 DIAGNOSIS — J449 Chronic obstructive pulmonary disease, unspecified: Secondary | ICD-10-CM | POA: Diagnosis not present

## 2018-05-29 DIAGNOSIS — G4733 Obstructive sleep apnea (adult) (pediatric): Secondary | ICD-10-CM | POA: Diagnosis not present

## 2018-05-30 ENCOUNTER — Other Ambulatory Visit: Payer: Self-pay | Admitting: Family Medicine

## 2018-05-30 NOTE — Telephone Encounter (Signed)
Refill request for general medication: ProAir Inhaler  Last office visit: 02/02/2018  Last physical exam: 07/07/2017  Follow-ups on file. 07/26/2018

## 2018-06-08 DIAGNOSIS — H2513 Age-related nuclear cataract, bilateral: Secondary | ICD-10-CM | POA: Diagnosis not present

## 2018-06-08 LAB — HM DIABETES EYE EXAM

## 2018-06-11 ENCOUNTER — Encounter: Payer: Medicare Other | Admitting: Anesthesiology

## 2018-06-11 DIAGNOSIS — H5213 Myopia, bilateral: Secondary | ICD-10-CM | POA: Diagnosis not present

## 2018-06-21 ENCOUNTER — Ambulatory Visit (HOSPITAL_BASED_OUTPATIENT_CLINIC_OR_DEPARTMENT_OTHER): Payer: Medicare Other | Admitting: Anesthesiology

## 2018-06-21 ENCOUNTER — Encounter: Payer: Self-pay | Admitting: Anesthesiology

## 2018-06-21 ENCOUNTER — Other Ambulatory Visit: Payer: Self-pay

## 2018-06-21 ENCOUNTER — Other Ambulatory Visit: Payer: Self-pay | Admitting: Anesthesiology

## 2018-06-21 ENCOUNTER — Encounter: Payer: Medicare Other | Admitting: Anesthesiology

## 2018-06-21 ENCOUNTER — Ambulatory Visit
Admission: RE | Admit: 2018-06-21 | Discharge: 2018-06-21 | Disposition: A | Discharge status: Home / Self Care | Payer: Medicare Other | Source: Ambulatory Visit | Attending: Anesthesiology | Admitting: Anesthesiology

## 2018-06-21 VITALS — BP 147/93 | HR 99 | Temp 97.9°F | Resp 18 | Ht 64.0 in | Wt 250.0 lb

## 2018-06-21 DIAGNOSIS — M5432 Sciatica, left side: Secondary | ICD-10-CM | POA: Diagnosis not present

## 2018-06-21 DIAGNOSIS — M5386 Other specified dorsopathies, lumbar region: Secondary | ICD-10-CM | POA: Insufficient documentation

## 2018-06-21 DIAGNOSIS — G8918 Other acute postprocedural pain: Secondary | ICD-10-CM

## 2018-06-21 DIAGNOSIS — R52 Pain, unspecified: Secondary | ICD-10-CM

## 2018-06-21 DIAGNOSIS — M150 Primary generalized (osteo)arthritis: Secondary | ICD-10-CM

## 2018-06-21 DIAGNOSIS — M47816 Spondylosis without myelopathy or radiculopathy, lumbar region: Secondary | ICD-10-CM | POA: Insufficient documentation

## 2018-06-21 DIAGNOSIS — F1190 Opioid use, unspecified, uncomplicated: Secondary | ICD-10-CM | POA: Insufficient documentation

## 2018-06-21 DIAGNOSIS — M25552 Pain in left hip: Secondary | ICD-10-CM | POA: Diagnosis not present

## 2018-06-21 DIAGNOSIS — Z842 Family history of other diseases of the genitourinary system: Secondary | ICD-10-CM

## 2018-06-21 DIAGNOSIS — M542 Cervicalgia: Secondary | ICD-10-CM | POA: Insufficient documentation

## 2018-06-21 DIAGNOSIS — G894 Chronic pain syndrome: Secondary | ICD-10-CM | POA: Diagnosis not present

## 2018-06-21 DIAGNOSIS — M5136 Other intervertebral disc degeneration, lumbar region: Secondary | ICD-10-CM

## 2018-06-21 DIAGNOSIS — M159 Polyosteoarthritis, unspecified: Secondary | ICD-10-CM

## 2018-06-21 DIAGNOSIS — M25551 Pain in right hip: Secondary | ICD-10-CM | POA: Insufficient documentation

## 2018-06-21 DIAGNOSIS — M8949 Other hypertrophic osteoarthropathy, multiple sites: Secondary | ICD-10-CM

## 2018-06-21 DIAGNOSIS — M545 Low back pain, unspecified: Secondary | ICD-10-CM

## 2018-06-21 MED ORDER — LIDOCAINE HCL (PF) 1 % IJ SOLN
INTRAMUSCULAR | Status: AC
  Filled 2018-06-21: qty 5

## 2018-06-21 MED ORDER — OXYCODONE HCL 10 MG PO TABS: 10.0000 mg | ORAL_TABLET | Freq: Four times a day (QID) | ORAL | 0 refills | Status: AC

## 2018-06-21 MED ORDER — SODIUM CHLORIDE (PF) 0.9 % IJ SOLN
INTRAMUSCULAR | Status: AC
  Filled 2018-06-21: qty 10

## 2018-06-21 MED ORDER — IOPAMIDOL (ISOVUE-M 200) INJECTION 41%
INTRAMUSCULAR | Status: AC
  Filled 2018-06-21: qty 10

## 2018-06-21 MED ORDER — ROPIVACAINE HCL 2 MG/ML IJ SOLN
INTRAMUSCULAR | Status: AC
  Filled 2018-06-21: qty 10

## 2018-06-21 NOTE — Patient Instructions (Signed)
____________________________________________________________________________________________  Preparing for your procedure (without sedation)  Instructions: . Oral Intake: Do not eat or drink anything for at least 3 hours prior to your procedure. . Transportation: Unless otherwise stated by your physician, you may drive yourself after the procedure. . Blood Pressure Medicine: Take your blood pressure medicine with a sip of water the morning of the procedure. . Blood thinners: Notify our staff if you are taking any blood thinners. Depending on which one you take, there will be specific instructions on how and when to stop it. . Diabetics on insulin: Notify the staff so that you can be scheduled 1st case in the morning. If your diabetes requires high dose insulin, take only  of your normal insulin dose the morning of the procedure and notify the staff that you have done so. . Preventing infections: Shower with an antibacterial soap the morning of your procedure.  . Build-up your immune system: Take 1000 mg of Vitamin C with every meal (3 times a day) the day prior to your procedure. Marland Kitchen Antibiotics: Inform the staff if you have a condition or reason that requires you to take antibiotics before dental procedures. . Pregnancy: If you are pregnant, call and cancel the procedure. . Sickness: If you have a cold, fever, or any active infections, call and cancel the procedure. . Arrival: You must be in the facility at least 30 minutes prior to your scheduled procedure. . Children: Do not bring any children with you. . Dress appropriately: Bring dark clothing that you would not mind if they get stained. . Valuables: Do not bring any jewelry or valuables.  Procedure appointments are reserved for interventional treatments only. Marland Kitchen No Prescription Refills. . No medication changes will be discussed during procedure appointments. . No disability issues will be discussed.  Reasons to call and reschedule or  cancel your procedure: (Following these recommendations will minimize the risk of a serious complication.) . Surgeries: Avoid having procedures within 2 weeks of any surgery. (Avoid for 2 weeks before or after any surgery). . Flu Shots: Avoid having procedures within 2 weeks of a flu shots or . (Avoid for 2 weeks before or after immunizations). . Barium: Avoid having a procedure within 7-10 days after having had a radiological study involving the use of radiological contrast. (Myelograms, Barium swallow or enema study). . Heart attacks: Avoid any elective procedures or surgeries for the initial 6 months after a "Myocardial Infarction" (Heart Attack). . Blood thinners: It is imperative that you stop these medications before procedures. Let us know if you if you take any blood thinner.  . Infection: Avoid procedures during or within two weeks of an infection (including chest colds or gastrointestinal problems). Symptoms associated with infections include: Localized redness, fever, chills, night sweats or profuse sweating, burning sensation when voiding, cough, congestion, stuffiness, runny nose, sore throat, diarrhea, nausea, vomiting, cold or Flu symptoms, recent or current infections. It is specially important if the infection is over the area that we intend to treat. Marland Kitchen Heart and lung problems: Symptoms that may suggest an active cardiopulmonary problem include: cough, chest pain, breathing difficulties or shortness of breath, dizziness, ankle swelling, uncontrolled high or unusually low blood pressure, and/or palpitations. If you are experiencing any of these symptoms, cancel your procedure and contact your primary care physician for an evaluation.  Remember:  Regular Business hours are:  Monday to Thursday 8:00 AM to 4:00 PM  Provider's Schedule: Milinda Pointer, MD:  Procedure days: Tuesday and Thursday 7:30 AM to  4:00 PM  Gillis Santa, MD:  Procedure days: Monday and Wednesday 7:30 AM to 4:00  PM ____________________________________________________________________________________________ Dennis Bast were given 2 prescriptions for Oxycodone.

## 2018-06-21 NOTE — Progress Notes (Signed)
Subjective:  Patient ID: Wendy Johnson, female    DOB: Dec 20, 1965  Age: 53 y.o. MRN: [id number]  CC: Back Pain (lower)   Procedure: None  HPI [facility] presents for reevaluation.  She was last seen a few months ago and has been doing well with her oxycodone.  She generally takes this up to 5 times a day.  The quality characteristic and distribution of her pain have been stable.  She primarily has diffuse pain of the low back bilateral hips knees buttocks and legs.  The medications keep her pain under reasonable control and allow her to sleep better at night.  Based on her narcotic assessment sheet she is continuing to derive good functional lifestyle provement.  No untoward side effects are noted.  She is still getting some bilateral calf cramping and occasional numbness and tingling.  She is scheduled for an epidural injection today.  Outpatient Medications Prior to Visit  Medication Sig Dispense Refill  . albuterol (PROVENTIL) (2.5 MG/3ML) 0.083% nebulizer solution Take 3 mLs (2.5 mg total) by nebulization every 6 (six) hours as needed for wheezing or shortness of breath. 75 mL 1  . amphetamine-dextroamphetamine (ADDERALL) 30 MG tablet Take 30 mg by mouth 2 (two) times daily.    . ARIPiprazole (ABILIFY) 5 MG tablet Take 5 mg by mouth daily.     . Biotin 5 MG TABS Take 1 tablet by mouth daily.    . busPIRone (BUSPAR) 30 MG tablet Take 30 mg by mouth 2 (two) times daily.     . clonazePAM (KLONOPIN) 1 MG tablet Take 2 mg by mouth 3 (three) times daily.    Marland Kitchen dexlansoprazole (DEXILANT) 60 MG capsule Take 1 capsule (60 mg total) by mouth daily. 30 capsule 5  . hydrOXYzine (ATARAX/VISTARIL) 25 MG tablet Take 25 mg by mouth every 6 (six) hours as needed for anxiety or itching (hives).    . loratadine (CLARITIN) 10 MG tablet Take 1 tablet (10 mg total) by mouth daily. 30 tablet 1  . PROAIR HFA 108 (90 Base) MCG/ACT inhaler USE 2 PUFFS BY MOUTH 4 TIMES A DAY 8.5 g 0  . sertraline (ZOLOFT) 100  MG tablet Take 100 mg by mouth 2 (two) times daily.    Marland Kitchen tiotropium (SPIRIVA HANDIHALER) 18 MCG inhalation capsule Place 1 capsule (18 mcg total) into inhaler and inhale daily. 30 capsule 5  . traZODone (DESYREL) 100 MG tablet Take 300 mg by mouth at bedtime.    . Oxycodone HCl 10 MG TABS Take 1 tablet (10 mg total) by mouth every 4 (four) hours while awake. 150 tablet 0  . azithromycin (ZITHROMAX) 250 MG tablet 2 tablets first day and one tablet for the next 4 days. (Patient not taking: Reported on 02/08/2018) 6 tablet 0  . cyclobenzaprine (FLEXERIL) 5 MG tablet Take 1 tablet (5 mg total) by mouth 3 (three) times daily as needed for muscle spasms. (Patient not taking: Reported on 04/24/2018) 30 tablet 0  . fluticasone (FLONASE) 50 MCG/ACT nasal spray Place 2 sprays into both nostrils daily. (Patient not taking: Reported on 04/24/2018) 16 g 6  . ibuprofen (ADVIL,MOTRIN) 800 MG tablet Take 1 tablet (800 mg total) by mouth every 8 (eight) hours as needed. (Patient not taking: Reported on 06/21/2018) 30 tablet 0  . naloxone (NARCAN) nasal spray 4 mg/0.1 mL For respiratory suppression from opioids (Patient not taking: Reported on 06/21/2018) 1 kit 2  . promethazine-dextromethorphan (PROMETHAZINE-DM) 6.25-15 MG/5ML syrup Take 2.5 mLs by mouth  4 (four) times daily as needed for cough. (Patient not taking: Reported on 04/24/2018) 118 mL 0   No facility-administered medications prior to visit.     Review of Systems CNS: No confusion or sedation Cardiac: No angina or palpitations GI: No abdominal pain or constipation Constitutional: No nausea vomiting fevers or chills  Objective:  BP (!) 147/93   Pulse 99   Temp 97.9 F (36.6 C) (Oral)   Resp 18   Ht _0  (1.626 m)   Wt 250 lb (113.4 kg)   LMP 09/26/2014 Comment: Total  SpO2 98%   BMI 42.91 kg/m    BP Readings from Last 3 Encounters:  06/21/18 (!) 147/93  02/08/18 (!) 151/100  02/02/18 136/70     Wt Readings from Last 3 Encounters:   06/21/18 250 lb (113.4 kg)  04/24/18 260 lb (117.9 kg)  02/08/18 250 lb (113.4 kg)     Physical Exam Pt is alert and oriented PERRL EOMI HEART IS RRR no murmur or rub LCTA no wheezing or rales MUSCULOSKELETAL reveals an antalgic gait but baseline muscle tone and muscle bulk are good with no changes.  She has some mild paraspinous muscle tenderness but no lumbar trigger points.  She does still have a positive straight leg raise left side greater than right  Labs  Lab Results  Component Value Date   HGBA1C 5.5 01/22/2018   HGBA1C 5.9 (H) 07/19/2017   HGBA1C 5.4 04/21/2017   Lab Results  Component Value Date   MICROALBUR 0.3 01/22/2018   LDLCALC 90 01/22/2018   CREATININE 0.64 01/22/2018    -------------------------------------------------------------------------------------------------------------------- Lab Results  Component Value Date   WBC 10.1 01/22/2018   HGB 14.7 01/22/2018   HCT 44.7 01/22/2018   PLT 308 01/22/2018   GLUCOSE 99 01/22/2018   CHOL 171 01/22/2018   TRIG 372 (H) 01/22/2018   HDL 31 (L) 01/22/2018   LDLCALC 90 01/22/2018   ALT 23 01/22/2018   AST 18 01/22/2018   NA 139 01/22/2018   K 4.1 01/22/2018   CL 103 01/22/2018   CREATININE 0.64 01/22/2018   BUN 11 01/22/2018   CO2 24 01/22/2018   TSH 0.769 02/03/2016   INR 0.97 10/04/2016   HGBA1C 5.5 01/22/2018   MICROALBUR 0.3 01/22/2018    --------------------------------------------------------------------------------------------------------------------- No results found.   Assessment & Plan:   Lanijah was seen today for back pain.  Diagnoses and all orders for this visit:  Primary osteoarthritis involving multiple joints  DDD (degenerative disc disease), lumbar -     Lumbar Epidural Injection  Sciatica of left side -     Lumbar Epidural Injection  Low back pain at multiple sites  Chronic, continuous use of opioids  Facet arthritis of lumbar region  Chronic pain  syndrome  Cervicalgia  Low back derangement syndrome  Pain of both hip joints  Post-op pain -     Oxycodone HCl 10 MG TABS; Take 1 tablet (10 mg total) by mouth 4 (four) times daily for 30 days.  FH: TAH-BSO (total abdominal hysterectomy and bilateral salpingo-oophorectomy) -     Oxycodone HCl 10 MG TABS; Take 1 tablet (10 mg total) by mouth 4 (four) times daily for 30 days.  Other orders -     Oxycodone HCl 10 MG TABS; Take 1 tablet (10 mg total) by mouth 4 (four) times daily for 30 days.        ----------------------------------------------------------------------------------------------------------------------  Problem List Items Addressed This Visit      Unprioritized  DDD (degenerative disc disease), lumbar   Relevant Medications   Oxycodone HCl 10 MG TABS   Oxycodone HCl 10 MG TABS (Start on 07/21/2018)   Facet arthritis of lumbar region   Relevant Medications   Oxycodone HCl 10 MG TABS   Oxycodone HCl 10 MG TABS (Start on 07/21/2018)   Low back derangement syndrome   Relevant Medications   Oxycodone HCl 10 MG TABS   Oxycodone HCl 10 MG TABS (Start on 07/21/2018)   Primary osteoarthritis involving multiple joints - Primary   Relevant Medications   Oxycodone HCl 10 MG TABS   Oxycodone HCl 10 MG TABS (Start on 07/21/2018)    Other Visit Diagnoses    Sciatica of left side       Low back pain at multiple sites       Relevant Medications   Oxycodone HCl 10 MG TABS   Oxycodone HCl 10 MG TABS (Start on 07/21/2018)   Chronic, continuous use of opioids       Chronic pain syndrome       Cervicalgia       Pain of both hip joints       Post-op pain       Relevant Medications   Oxycodone HCl 10 MG TABS   FH: TAH-BSO (total abdominal hysterectomy and bilateral salpingo-oophorectomy)       Relevant Medications   Oxycodone HCl 10 MG TABS        ----------------------------------------------------------------------------------------------------------------------  1.  DDD (degenerative disc disease), lumbar We will plan on an epidural injection but the clinic fluoroscopy availability was limited and she has chosen to reschedule for next week. - Lumbar Epidural Injection  2. Sciatica of left side As above and continue core stretching strengthening exercises as tolerated - Lumbar Epidural Injection  3. Primary osteoarthritis involving multiple joints As above  4. Low back pain at multiple sites As above  5. Chronic, continuous use of opioids We will keep her on her current medication management.  Refill was given today for February 13 and March 14.  We have reviewed the [facility] practitioner database information and it is appropriate.  6. Facet arthritis of lumbar region   7. Chronic pain syndrome   8. Cervicalgia   9. Low back derangement syndrome   10. Pain of both hip joints   11. Post-op pain  - Oxycodone HCl 10 MG TABS; Take 1 tablet (10 mg total) by mouth 4 (four) times daily for 30 days.  Dispense: 120 tablet; Refill: 0  12. FH: TAH-BSO (total abdominal hysterectomy and bilateral salpingo-oophorectomy)  - Oxycodone HCl 10 MG TABS; Take 1 tablet (10 mg total) by mouth 4 (four) times daily for 30 days.  Dispense: 120 tablet; Refill: 0    ----------------------------------------------------------------------------------------------------------------------  I have changed Nobie Putnam. Bryand "[name]"'s Oxycodone HCl. I am also having her start on Oxycodone HCl. Additionally, I am having her maintain her amphetamine-dextroamphetamine, busPIRone, sertraline, hydrOXYzine, naloxone, clonazePAM, ARIPiprazole, traZODone, Biotin, cyclobenzaprine, tiotropium, dexlansoprazole, promethazine-dextromethorphan, fluticasone, loratadine, azithromycin, albuterol, ibuprofen, and PROAIR HFA.   Meds ordered this encounter  Medications  . Oxycodone HCl 10 MG TABS    Sig: Take 1 tablet (10 mg total) by mouth 4 (four) times daily for 30 days.     Dispense:  120 tablet    Refill:  0    30 day supply and no early refills  . Oxycodone HCl 10 MG TABS    Sig: Take 1 tablet (10 mg total) by mouth 4 (four) times  daily for 30 days.    Dispense:  120 tablet    Refill:  0    30 day supply and no early refill   Patient's Medications  New Prescriptions   OXYCODONE HCL 10 MG TABS    Take 1 tablet (10 mg total) by mouth 4 (four) times daily for 30 days.  Previous Medications   ALBUTEROL (PROVENTIL) (2.5 MG/3ML) 0.083% NEBULIZER SOLUTION    Take 3 mLs (2.5 mg total) by nebulization every 6 (six) hours as needed for wheezing or shortness of breath.   AMPHETAMINE-DEXTROAMPHETAMINE (ADDERALL) 30 MG TABLET    Take 30 mg by mouth 2 (two) times daily.   ARIPIPRAZOLE (ABILIFY) 5 MG TABLET    Take 5 mg by mouth daily.    AZITHROMYCIN (ZITHROMAX) 250 MG TABLET    2 tablets first day and one tablet for the next 4 days.   BIOTIN 5 MG TABS    Take 1 tablet by mouth daily.   BUSPIRONE (BUSPAR) 30 MG TABLET    Take 30 mg by mouth 2 (two) times daily.    CLONAZEPAM (KLONOPIN) 1 MG TABLET    Take 2 mg by mouth 3 (three) times daily.   CYCLOBENZAPRINE (FLEXERIL) 5 MG TABLET    Take 1 tablet (5 mg total) by mouth 3 (three) times daily as needed for muscle spasms.   DEXLANSOPRAZOLE (DEXILANT) 60 MG CAPSULE    Take 1 capsule (60 mg total) by mouth daily.   FLUTICASONE (FLONASE) 50 MCG/ACT NASAL SPRAY    Place 2 sprays into both nostrils daily.   HYDROXYZINE (ATARAX/VISTARIL) 25 MG TABLET    Take 25 mg by mouth every 6 (six) hours as needed for anxiety or itching (hives).   IBUPROFEN (ADVIL,MOTRIN) 800 MG TABLET    Take 1 tablet (800 mg total) by mouth every 8 (eight) hours as needed.   LORATADINE (CLARITIN) 10 MG TABLET    Take 1 tablet (10 mg total) by mouth daily.   NALOXONE (NARCAN) NASAL SPRAY 4 MG/0.1 ML    For respiratory suppression from opioids   PROAIR HFA 108 (90 BASE) MCG/ACT INHALER    USE 2 PUFFS BY MOUTH 4 TIMES A DAY   PROMETHAZINE-DEXTROMETHORPHAN  (PROMETHAZINE-DM) 6.25-15 MG/5ML SYRUP    Take 2.5 mLs by mouth 4 (four) times daily as needed for cough.   SERTRALINE (ZOLOFT) 100 MG TABLET    Take 100 mg by mouth 2 (two) times daily.   TIOTROPIUM (SPIRIVA HANDIHALER) 18 MCG INHALATION CAPSULE    Place 1 capsule (18 mcg total) into inhaler and inhale daily.   TRAZODONE (DESYREL) 100 MG TABLET    Take 300 mg by mouth at bedtime.  Modified Medications   Modified Medication Previous Medication   OXYCODONE HCL 10 MG TABS Oxycodone HCl 10 MG TABS      Take 1 tablet (10 mg total) by mouth 4 (four) times daily for 30 days.    Take 1 tablet (10 mg total) by mouth every 4 (four) hours while awake.  Discontinued Medications   No medications on file   ----------------------------------------------------------------------------------------------------------------------  Follow-up: Return in about 6 days (around 06/27/2018) for procedure.    Molli Barrows, MD

## 2018-06-21 NOTE — Progress Notes (Signed)
Nursing Pain Medication Assessment:  Safety precautions to be maintained throughout the outpatient stay will include: orient to surroundings, keep bed in low position, maintain call bell within reach at all times, provide assistance with transfer out of bed and ambulation.  Medication Inspection Compliance: Pill count conducted under aseptic conditions, in front of the patient. Neither the pills nor the bottle was removed from the patient's sight at any time. Once count was completed pills were immediately returned to the patient in their original bottle.  Medication: Oxycodone IR Pill/Patch Count: 0 of 150 pills remain Pill/Patch Appearance: Markings consistent with prescribed medication Bottle Appearance: Standard pharmacy container. Clearly labeled. Filled Date: 01/16 / 2020 Last Medication intake:  Today   Dr. Andree Elk notified of PMP results, last UDS, and discrepancy of reported not taking Clonidine although it is shown on PMP as being filled 06-20-18.

## 2018-06-22 ENCOUNTER — Encounter: Payer: Self-pay | Admitting: Obstetrics and Gynecology

## 2018-06-22 ENCOUNTER — Ambulatory Visit (INDEPENDENT_AMBULATORY_CARE_PROVIDER_SITE_OTHER): Payer: Medicare Other | Admitting: Obstetrics and Gynecology

## 2018-06-22 ENCOUNTER — Telehealth: Payer: Self-pay | Admitting: Anesthesiology

## 2018-06-22 VITALS — BP 138/84 | HR 85 | Ht 64.0 in | Wt 255.4 lb

## 2018-06-22 DIAGNOSIS — N764 Abscess of vulva: Secondary | ICD-10-CM

## 2018-06-22 NOTE — Telephone Encounter (Signed)
Patient lvmail asking to speak w/ nurse or Dr. Andree Elk. No other information.

## 2018-06-22 NOTE — Telephone Encounter (Signed)
Patient states she got it straightened out yesterday.

## 2018-06-22 NOTE — Progress Notes (Signed)
HPI:      Ms. Wendy Johnson is a 53 y.o. G1P1001 who LMP was Patient's last menstrual period was 09/26/2014.  Subjective:   She presents today with complaint of several week history of a "cyst".  She says nothing has drained for it and it is irritated.  She says she has never had anything like it before.    Hx: The following portions of the patient's history were reviewed and updated as appropriate:             She  has a past medical history of ADHD (attention deficit hyperactivity disorder), Anxiety, AR (allergic rhinitis), Arthritis, Benign hypertension, Bipolar disorder ([place]), Chronic back pain, Chronic constipation, Chronic insomnia, COPD (chronic obstructive pulmonary disease) ([place]), [place], Decreased dorsalis pedis pulse, Depression, Diabetes mellitus without complication ([place]), Dyslipidemia, Fatty liver, GERD (gastroesophageal reflux disease), Hepatomegaly, High cholesterol, Hot flashes, Migraine with aura, OCD (obsessive compulsive disorder), Plantar warts, Severe obesity ([place]), Shortness of breath dyspnea, Sleep apnea, Tobacco use, and Trochanteric bursitis of right hip. She does not have any pertinent problems on file. She  has a past surgical history that includes Lipoma excision; knee arthroscopo (Right); Tonsillectomy; Tubal ligation; laparoscopy (09/22/2014); Foot surgery (Bilateral); bone spurs removed (Bilateral); Abdominal hysterectomy (N/A, 10/20/2014); Bilateral salpingoophorectomy; Total hip arthroplasty (Left, 12/16/2014); Total hip arthroplasty (Right, 05/12/2015); Total knee revision (Right, 09/22/2015); Joint replacement (Right); Joint replacement (Bilateral); Colonoscopy with propofol (N/A, 09/01/2016); Esophagogastroduodenoscopy (egd) with propofol (N/A, 09/01/2016); Total knee revision (Right, 10/11/2016); Dorsal compartment release (Left, 06/08/2017); Carpal tunnel release (Left, 06/08/2017); Carpal tunnel release (Right, 10/10/2017); Robotic assisted laparoscopic ventral/incisional hernia  repair (N/A, 12/26/2017); and Insertion of mesh (N/A, 12/26/2017). Her family history includes Breast cancer in her maternal aunt and paternal aunt; Diabetes in her mother and sister; Drug abuse in her sister; Healthy in her son; Heart attack in her father; Heart disease in her father. She  reports that she has been smoking cigarettes. She started smoking about 38 years ago. She has a 18.50 pack-year smoking history. She has never used smokeless tobacco. She reports that she does not drink alcohol or use drugs. She has a current medication list which includes the following prescription(s): albuterol, amphetamine-dextroamphetamine, aripiprazole, buspirone, clonazepam, dexlansoprazole, hydroxyzine, oxycodone hcl, proair hfa, sertraline, tiotropium, trazodone, azithromycin, biotin, cyclobenzaprine, fluticasone, ibuprofen, loratadine, naloxone, oxycodone hcl, and promethazine-dextromethorphan. She is allergic to codeine and imitrex [sumatriptan].       Review of Systems:  Review of Systems  Constitutional: Denied constitutional symptoms, night sweats, recent illness, fatigue, fever, insomnia and weight loss.  Eyes: Denied eye symptoms, eye pain, photophobia, vision change and visual disturbance.  Ears/Nose/Throat/Neck: Denied ear, nose, throat or neck symptoms, hearing loss, nasal discharge, sinus congestion and sore throat.  Cardiovascular: Denied cardiovascular symptoms, arrhythmia, chest pain/pressure, edema, exercise intolerance, orthopnea and palpitations.  Respiratory: Denied pulmonary symptoms, asthma, pleuritic pain, productive sputum, cough, dyspnea and wheezing.  Gastrointestinal: Denied, gastro-esophageal reflux, melena, nausea and vomiting.  Genitourinary: See HPI for additional information.  Musculoskeletal: Denied musculoskeletal symptoms, stiffness, swelling, muscle weakness and myalgia.  Dermatologic: Denied dermatology symptoms, rash and scar.  Neurologic: Denied neurology symptoms,  dizziness, headache, neck pain and syncope.  Psychiatric: Denied psychiatric symptoms, anxiety and depression.  Endocrine: Denied endocrine symptoms including hot flashes and night sweats.   Meds:   Current Outpatient Medications on File Prior to Visit  Medication Sig Dispense Refill  . albuterol (PROVENTIL) (2.5 MG/3ML) 0.083% nebulizer solution Take 3 mLs (2.5 mg total) by nebulization every 6 (six) hours as  needed for wheezing or shortness of breath. 75 mL 1  . amphetamine-dextroamphetamine (ADDERALL) 30 MG tablet Take 30 mg by mouth 2 (two) times daily.    . ARIPiprazole (ABILIFY) 5 MG tablet Take 5 mg by mouth daily.     . busPIRone (BUSPAR) 30 MG tablet Take 30 mg by mouth 2 (two) times daily.     . clonazePAM (KLONOPIN) 1 MG tablet Take 2 mg by mouth 3 (three) times daily.    Marland Kitchen dexlansoprazole (DEXILANT) 60 MG capsule Take 1 capsule (60 mg total) by mouth daily. 30 capsule 5  . hydrOXYzine (ATARAX/VISTARIL) 25 MG tablet Take 25 mg by mouth every 6 (six) hours as needed for anxiety or itching (hives).    . Oxycodone HCl 10 MG TABS Take 1 tablet (10 mg total) by mouth 4 (four) times daily for 30 days. 120 tablet 0  . PROAIR HFA 108 (90 Base) MCG/ACT inhaler USE 2 PUFFS BY MOUTH 4 TIMES A DAY 8.5 g 0  . sertraline (ZOLOFT) 100 MG tablet Take 100 mg by mouth 2 (two) times daily.    Marland Kitchen tiotropium (SPIRIVA HANDIHALER) 18 MCG inhalation capsule Place 1 capsule (18 mcg total) into inhaler and inhale daily. 30 capsule 5  . traZODone (DESYREL) 100 MG tablet Take 300 mg by mouth at bedtime.    Marland Kitchen azithromycin (ZITHROMAX) 250 MG tablet 2 tablets first day and one tablet for the next 4 days. (Patient not taking: Reported on 02/08/2018) 6 tablet 0  . Biotin 5 MG TABS Take 1 tablet by mouth daily.    . cyclobenzaprine (FLEXERIL) 5 MG tablet Take 1 tablet (5 mg total) by mouth 3 (three) times daily as needed for muscle spasms. (Patient not taking: Reported on 04/24/2018) 30 tablet 0  . fluticasone  (FLONASE) 50 MCG/ACT nasal spray Place 2 sprays into both nostrils daily. (Patient not taking: Reported on 04/24/2018) 16 g 6  . ibuprofen (ADVIL,MOTRIN) 800 MG tablet Take 1 tablet (800 mg total) by mouth every 8 (eight) hours as needed. (Patient not taking: Reported on 06/21/2018) 30 tablet 0  . loratadine (CLARITIN) 10 MG tablet Take 1 tablet (10 mg total) by mouth daily. (Patient not taking: Reported on 06/22/2018) 30 tablet 1  . naloxone (NARCAN) nasal spray 4 mg/0.1 mL For respiratory suppression from opioids (Patient not taking: Reported on 06/21/2018) 1 kit 2  . [START ON 07/21/2018] Oxycodone HCl 10 MG TABS Take 1 tablet (10 mg total) by mouth 4 (four) times daily for 30 days. (Patient not taking: Reported on 06/22/2018) 120 tablet 0  . promethazine-dextromethorphan (PROMETHAZINE-DM) 6.25-15 MG/5ML syrup Take 2.5 mLs by mouth 4 (four) times daily as needed for cough. (Patient not taking: Reported on 04/24/2018) 118 mL 0   No current facility-administered medications on file prior to visit.     Objective:     Vitals:   06/22/18 1004  BP: 138/84  Pulse: 85              Hair follicle on the mons appears to have been infected.  It is mostly healed at this time.  Very small amount of induration palpable underneath.  No erythema.  There is nothing to drain.  It looks mostly like it is almost completely resolved.  Assessment:    G1P1001 Patient Active Problem List   Diagnosis Date Noted  . History of alcoholism ([place]) 07/19/2017  . Bilateral carpal tunnel syndrome 05/29/2017  . Painful total knee replacement, right ([place]) 10/11/2016  . Rotator cuff  tendinitis, right 07/29/2016  . Occipital neuralgia 10/12/2015  . Instability of prosthetic knee ([place]) 09/22/2015  . Primary osteoarthritis of right hip 05/12/2015  . Sleep apnea 04/07/2015  . Primary osteoarthritis of left hip 12/16/2014  . Metabolic syndrome 12/75/1700  . Migraine without aura and without status migrainosus, not  intractable 11/26/2014  . GERD without esophagitis 11/26/2014  . COPD, moderate ([place]) 11/26/2014  . Nocturnal oxygen desaturation 11/26/2014  . Supplemental oxygen dependent 11/26/2014  . Hearing loss 11/26/2014  . Chronic insomnia 11/26/2014  . History of hypertension 11/26/2014  . Dyslipidemia 11/26/2014  . Morbid obesity ([place]) 11/26/2014  . Dyslipidemia associated with type 2 diabetes mellitus ([place]) 11/26/2014  . Chronic constipation 11/26/2014  . Generalized anxiety disorder 11/11/2014  . DDD (degenerative disc disease), lumbar 11/11/2014  . Facet arthritis of lumbar region 11/11/2014  . Primary osteoarthritis involving multiple joints 11/11/2014  . H/O hysterectomy for benign disease 10/20/2014  . Low back derangement syndrome 09/30/2014  . Bipolar disorder ([place]) 09/30/2014     1. Vulval boil     Resolved.   Plan:            1.  I have attempted to reassure the patient that this is nothing serious.  In addition I have explained to her that it is almost completely resolved and there is nothing to drain.  I have asked her to stop checking it and trying to pop it which has exacerbated the inflammation. Orders No orders of the defined types were placed in this encounter.   No orders of the defined types were placed in this encounter.     F/U  Return for Pt to contact us if symptoms worsen. I spent 16 minutes involved in the care of this patient of which greater than 50% was spent discussing her symptoms, vulvar boil/infected hair follicle.  All questions answered.  Finis Bud, M.D. 06/22/2018 10:36 AM

## 2018-06-22 NOTE — Progress Notes (Signed)
Pt stated that she has a cyst on her vaginal area for a month that is painful to the touch. Pt stated that the area is red, warm and she tired to see if something would come out of it but it would not.

## 2018-06-27 ENCOUNTER — Other Ambulatory Visit: Payer: Self-pay

## 2018-06-27 ENCOUNTER — Encounter: Payer: Self-pay | Admitting: Anesthesiology

## 2018-06-27 ENCOUNTER — Ambulatory Visit
Admission: RE | Admit: 2018-06-27 | Discharge: 2018-06-27 | Disposition: A | Discharge status: Home / Self Care | Payer: Medicare Other | Source: Ambulatory Visit | Attending: Anesthesiology | Admitting: Anesthesiology

## 2018-06-27 ENCOUNTER — Other Ambulatory Visit: Payer: Self-pay | Admitting: Anesthesiology

## 2018-06-27 ENCOUNTER — Ambulatory Visit (HOSPITAL_BASED_OUTPATIENT_CLINIC_OR_DEPARTMENT_OTHER): Payer: Medicare Other | Admitting: Anesthesiology

## 2018-06-27 VITALS — BP 144/88 | HR 72 | Temp 97.7°F | Resp 16 | Ht 64.0 in | Wt 255.0 lb

## 2018-06-27 DIAGNOSIS — M545 Low back pain, unspecified: Secondary | ICD-10-CM

## 2018-06-27 DIAGNOSIS — M542 Cervicalgia: Secondary | ICD-10-CM

## 2018-06-27 DIAGNOSIS — G894 Chronic pain syndrome: Secondary | ICD-10-CM

## 2018-06-27 DIAGNOSIS — M5432 Sciatica, left side: Secondary | ICD-10-CM | POA: Insufficient documentation

## 2018-06-27 DIAGNOSIS — F1190 Opioid use, unspecified, uncomplicated: Secondary | ICD-10-CM | POA: Insufficient documentation

## 2018-06-27 DIAGNOSIS — R52 Pain, unspecified: Secondary | ICD-10-CM | POA: Diagnosis not present

## 2018-06-27 DIAGNOSIS — M5136 Other intervertebral disc degeneration, lumbar region: Secondary | ICD-10-CM | POA: Insufficient documentation

## 2018-06-27 DIAGNOSIS — M47816 Spondylosis without myelopathy or radiculopathy, lumbar region: Secondary | ICD-10-CM | POA: Insufficient documentation

## 2018-06-27 DIAGNOSIS — M150 Primary generalized (osteo)arthritis: Secondary | ICD-10-CM | POA: Insufficient documentation

## 2018-06-27 DIAGNOSIS — M159 Polyosteoarthritis, unspecified: Secondary | ICD-10-CM

## 2018-06-27 DIAGNOSIS — M8949 Other hypertrophic osteoarthropathy, multiple sites: Secondary | ICD-10-CM

## 2018-06-27 MED ORDER — TRIAMCINOLONE ACETONIDE 40 MG/ML IJ SUSP
INTRAMUSCULAR | Status: AC
  Filled 2018-06-27: qty 1

## 2018-06-27 MED ORDER — IOPAMIDOL (ISOVUE-M 200) INJECTION 41%
INTRAMUSCULAR | Status: AC
  Filled 2018-06-27: qty 10

## 2018-06-27 MED ORDER — TRIAMCINOLONE ACETONIDE 40 MG/ML IJ SUSP
40.0000 mg | Freq: Once | INTRAMUSCULAR | Status: AC
  Administered 2018-06-27: 40 mg

## 2018-06-27 MED ORDER — LIDOCAINE HCL 2 % IJ SOLN
INTRAMUSCULAR | Status: AC
  Filled 2018-06-27: qty 20

## 2018-06-27 MED ORDER — IOPAMIDOL (ISOVUE-M 200) INJECTION 41%
20.0000 mL | Freq: Once | INTRAMUSCULAR | Status: DC | PRN
  Administered 2018-06-27: 10 mL
  Filled 2018-06-27: qty 20

## 2018-06-27 MED ORDER — SODIUM CHLORIDE (PF) 0.9 % IJ SOLN
INTRAMUSCULAR | Status: AC
  Filled 2018-06-27: qty 10

## 2018-06-27 MED ORDER — ROPIVACAINE HCL 2 MG/ML IJ SOLN
10.0000 mL | Freq: Once | INTRAMUSCULAR | Status: AC
  Administered 2018-06-27: 10 mL via EPIDURAL

## 2018-06-27 MED ORDER — ROPIVACAINE HCL 2 MG/ML IJ SOLN
INTRAMUSCULAR | Status: AC
  Filled 2018-06-27: qty 10

## 2018-06-27 MED ORDER — SODIUM CHLORIDE 0.9% FLUSH
10.0000 mL | Freq: Once | INTRAVENOUS | Status: AC
  Administered 2018-06-27: 10 mL

## 2018-06-27 MED ORDER — LIDOCAINE HCL (PF) 1 % IJ SOLN
5.0000 mL | Freq: Once | INTRAMUSCULAR | Status: AC
  Administered 2018-06-27: 5 mL via SUBCUTANEOUS

## 2018-06-27 NOTE — Progress Notes (Signed)
Safety precautions to be maintained throughout the outpatient stay will include: orient to surroundings, keep bed in low position, maintain call bell within reach at all times, provide assistance with transfer out of bed and ambulation.  

## 2018-06-27 NOTE — Patient Instructions (Signed)
Pain Management Discharge Instructions  General Discharge Instructions :  If you need to reach your doctor call: Monday-Friday 8:00 am - 4:00 pm at [phone] or toll free [phone].  After clinic hours [phone] to have operator reach doctor.  Bring all of your medication bottles to all your appointments in the pain clinic.  To cancel or reschedule your appointment with Pain Management please remember to call 24 hours in advance to avoid a fee.  Refer to the educational materials which you have been given on: General Risks, I had my Procedure. Discharge Instructions, Post Sedation.  Post Procedure Instructions:  The drugs you were given will stay in your system until tomorrow, so for the next 24 hours you should not drive, make any legal decisions or drink any alcoholic beverages.  You may eat anything you prefer, but it is better to start with liquids then soups and crackers, and gradually work up to solid foods.  Please notify your doctor immediately if you have any unusual bleeding, trouble breathing or pain that is not related to your normal pain.  Depending on the type of procedure that was done, some parts of your body may feel week and/or numb.  This usually clears up by tonight or the next day.  Walk with the use of an assistive device or accompanied by an adult for the 24 hours.  You may use ice on the affected area for the first 24 hours.  Put ice in a Ziploc bag and cover with a towel and place against area 15 minutes on 15 minutes off.  You may switch to heat after 24 hours.Epidural Steroid Injection Patient Information  Description: The epidural space surrounds the nerves as they exit the spinal cord.  In some patients, the nerves can be compressed and inflamed by a bulging disc or a tight spinal canal (spinal stenosis).  By injecting steroids into the epidural space, we can bring irritated nerves into direct contact with a potentially helpful medication.  These  steroids act directly on the irritated nerves and can reduce swelling and inflammation which often leads to decreased pain.  Epidural steroids may be injected anywhere along the spine and from the neck to the low back depending upon the location of your pain.   After numbing the skin with local anesthetic (like Novocaine), a small needle is passed into the epidural space slowly.  You may experience a sensation of pressure while this is being done.  The entire block usually last less than 10 minutes.  Conditions which may be treated by epidural steroids:   Low back and leg pain  Neck and arm pain  Spinal stenosis  Post-laminectomy syndrome  Herpes zoster (shingles) pain  Pain from compression fractures  Preparation for the injection:  1. Do not eat any solid food or dairy products within 8 hours of your appointment.  2. You may drink clear liquids up to 3 hours before appointment.  Clear liquids include water, black coffee, juice or soda.  No milk or cream please. 3. You may take your regular medication, including pain medications, with a sip of water before your appointment  Diabetics should hold regular insulin (if taken separately) and take 1/2 normal NPH dos the morning of the procedure.  Carry some sugar containing items with you to your appointment. 4. A driver must accompany you and be prepared to drive you home after your procedure.  5. Bring all your current medications with your. 6. An IV may be inserted and   sedation may be given at the discretion of the physician.   7. A blood pressure cuff, EKG and other monitors will often be applied during the procedure.  Some patients may need to have extra oxygen administered for a short period. 8. You will be asked to provide medical information, including your allergies, prior to the procedure.  We must know immediately if you are taking blood thinners (like Coumadin/Warfarin)  Or if you are allergic to IV iodine contrast (dye). We must  know if you could possible be pregnant.  Possible side-effects:  Bleeding from needle site  Infection (rare, may require surgery)  Nerve injury (rare)  Numbness & tingling (temporary)  Difficulty urinating (rare, temporary)  Spinal headache ( a headache worse with upright posture)  Light -headedness (temporary)  Pain at injection site (several days)  Decreased blood pressure (temporary)  Weakness in arm/leg (temporary)  Pressure sensation in back/neck (temporary)  Call if you experience:  Fever/chills associated with headache or increased back/neck pain.  Headache worsened by an upright position.  New onset weakness or numbness of an extremity below the injection site  Hives or difficulty breathing (go to the emergency room)  Inflammation or drainage at the infection site  Severe back/neck pain  Any new symptoms which are concerning to you  Please note:  Although the local anesthetic injected can often make your back or neck feel good for several hours after the injection, the pain will likely return.  It takes 3-7 days for steroids to work in the epidural space.  You may not notice any pain relief for at least that one week.  If effective, we will often do a series of three injections spaced 3-6 weeks apart to maximally decrease your pain.  After the initial series, we generally will wait several months before considering a repeat injection of the same type.  If you have any questions, please call [phone] [facility] 

## 2018-06-27 NOTE — Progress Notes (Signed)
Subjective:  Patient ID: Wendy Johnson, female    DOB: 03/18/1966  Age: 53 y.o. MRN: [id number]  CC: Back Pain (lower)   Procedure: L5-S1 epidural under fluoroscopic guidance with no sedation  HPI Wendy Johnson presents for reevaluation.  She was last seen few days ago and continues to have low back pain with bilateral hip and buttock pain this radiates into the lower extremities left and right with calf cramping and tenderness.  The pain is worse with increased activity.  She continues to have diffuse body pain of the same nature as previously documented and is taking her medications as prescribed and doing well with this based on her narcotic assessment sheet.  She desires to proceed with an epidural steroid injection for her low back pain and leg pain.  Outpatient Medications Prior to Visit  Medication Sig Dispense Refill  . albuterol (PROVENTIL) (2.5 MG/3ML) 0.083% nebulizer solution Take 3 mLs (2.5 mg total) by nebulization every 6 (six) hours as needed for wheezing or shortness of breath. 75 mL 1  . amphetamine-dextroamphetamine (ADDERALL) 30 MG tablet Take 30 mg by mouth 2 (two) times daily.    . ARIPiprazole (ABILIFY) 5 MG tablet Take 5 mg by mouth daily.     . Biotin 5 MG TABS Take 1 tablet by mouth daily.    . busPIRone (BUSPAR) 30 MG tablet Take 30 mg by mouth 2 (two) times daily.     . clonazePAM (KLONOPIN) 1 MG tablet Take 2 mg by mouth 3 (three) times daily.    . cyclobenzaprine (FLEXERIL) 5 MG tablet Take 1 tablet (5 mg total) by mouth 3 (three) times daily as needed for muscle spasms. 30 tablet 0  . dexlansoprazole (DEXILANT) 60 MG capsule Take 1 capsule (60 mg total) by mouth daily. 30 capsule 5  . fluticasone (FLONASE) 50 MCG/ACT nasal spray Place 2 sprays into both nostrils daily. 16 g 6  . hydrOXYzine (ATARAX/VISTARIL) 25 MG tablet Take 25 mg by mouth every 6 (six) hours as needed for anxiety or itching (hives).    Marland Kitchen ibuprofen (ADVIL,MOTRIN) 800 MG tablet Take 1 tablet  (800 mg total) by mouth every 8 (eight) hours as needed. 30 tablet 0  . loratadine (CLARITIN) 10 MG tablet Take 1 tablet (10 mg total) by mouth daily. 30 tablet 1  . naloxone (NARCAN) nasal spray 4 mg/0.1 mL For respiratory suppression from opioids 1 kit 2  . Oxycodone HCl 10 MG TABS Take 1 tablet (10 mg total) by mouth 4 (four) times daily for 30 days. 120 tablet 0  . [START ON 07/21/2018] Oxycodone HCl 10 MG TABS Take 1 tablet (10 mg total) by mouth 4 (four) times daily for 30 days. 120 tablet 0  . PROAIR HFA 108 (90 Base) MCG/ACT inhaler USE 2 PUFFS BY MOUTH 4 TIMES A DAY 8.5 g 0  . promethazine-dextromethorphan (PROMETHAZINE-DM) 6.25-15 MG/5ML syrup Take 2.5 mLs by mouth 4 (four) times daily as needed for cough. 118 mL 0  . sertraline (ZOLOFT) 100 MG tablet Take 100 mg by mouth 2 (two) times daily.    Marland Kitchen tiotropium (SPIRIVA HANDIHALER) 18 MCG inhalation capsule Place 1 capsule (18 mcg total) into inhaler and inhale daily. 30 capsule 5  . traZODone (DESYREL) 100 MG tablet Take 300 mg by mouth at bedtime.    Marland Kitchen azithromycin (ZITHROMAX) 250 MG tablet 2 tablets first day and one tablet for the next 4 days. (Patient not taking: Reported on 02/08/2018) 6 tablet 0  No facility-administered medications prior to visit.     Review of Systems CNS: No confusion or sedation Cardiac: No angina or palpitations GI: No abdominal pain or constipation Constitutional: No nausea vomiting fevers or chills  Objective:  BP (!) 144/88   Pulse 72   Temp 97.7 F (36.5 C) (Oral)   Resp 16   Ht 5' 4"  (1.626 m)   Wt 255 lb (115.7 kg)   LMP 09/26/2014 Comment: Total  SpO2 95%   BMI 43.77 kg/m    BP Readings from Last 3 Encounters:  06/27/18 (!) 144/88  06/22/18 138/84  06/21/18 (!) 147/93     Wt Readings from Last 3 Encounters:  06/27/18 255 lb (115.7 kg)  06/22/18 255 lb 6.4 oz (115.8 kg)  06/21/18 250 lb (113.4 kg)     Physical Exam Pt is alert and oriented PERRL EOMI HEART IS RRR no murmur  or rub LCTA no wheezing or rales MUSCULOSKELETAL reveals a positive straight leg raise bilaterally.  She has bilateral lumbar paraspinous muscle tenderness but no overt trigger points are noted  Labs  Lab Results  Component Value Date   HGBA1C 5.5 01/22/2018   HGBA1C 5.9 (H) 07/19/2017   HGBA1C 5.4 04/21/2017   Lab Results  Component Value Date   MICROALBUR 0.3 01/22/2018   LDLCALC 90 01/22/2018   CREATININE 0.64 01/22/2018    -------------------------------------------------------------------------------------------------------------------- Lab Results  Component Value Date   WBC 10.1 01/22/2018   HGB 14.7 01/22/2018   HCT 44.7 01/22/2018   PLT 308 01/22/2018   GLUCOSE 99 01/22/2018   CHOL 171 01/22/2018   TRIG 372 (H) 01/22/2018   HDL 31 (L) 01/22/2018   LDLCALC 90 01/22/2018   ALT 23 01/22/2018   AST 18 01/22/2018   NA 139 01/22/2018   K 4.1 01/22/2018   CL 103 01/22/2018   CREATININE 0.64 01/22/2018   BUN 11 01/22/2018   CO2 24 01/22/2018   TSH 0.769 02/03/2016   INR 0.97 10/04/2016   HGBA1C 5.5 01/22/2018   MICROALBUR 0.3 01/22/2018    --------------------------------------------------------------------------------------------------------------------- No results found.   Assessment & Plan:   Wendy Johnson was seen today for back pain.  Diagnoses and all orders for this visit:  DDD (degenerative disc disease), lumbar  Sciatica of left side  Primary osteoarthritis involving multiple joints  Low back pain at multiple sites  Chronic, continuous use of opioids -     ToxASSURE Select 13 (MW), Urine  Facet arthritis of lumbar region  Chronic pain syndrome -     ToxASSURE Select 13 (MW), Urine  Cervicalgia  Other orders -     triamcinolone acetonide (KENALOG-40) injection 40 mg -     sodium chloride flush (NS) 0.9 % injection 10 mL -     ropivacaine (PF) 2 mg/mL (0.2%) (NAROPIN) injection 10 mL -     lidocaine (PF) (XYLOCAINE) 1 % injection 5 mL -      iopamidol (ISOVUE-M) 41 % intrathecal injection 20 mL        ----------------------------------------------------------------------------------------------------------------------  Problem List Items Addressed This Visit      Unprioritized   DDD (degenerative disc disease), lumbar - Primary   Relevant Medications   triamcinolone acetonide (KENALOG-40) injection 40 mg (Completed)   Facet arthritis of lumbar region   Relevant Medications   triamcinolone acetonide (KENALOG-40) injection 40 mg (Completed)   Primary osteoarthritis involving multiple joints   Relevant Medications   triamcinolone acetonide (KENALOG-40) injection 40 mg (Completed)    Other Visit Diagnoses  Sciatica of left side       Low back pain at multiple sites       Relevant Medications   triamcinolone acetonide (KENALOG-40) injection 40 mg (Completed)   Chronic, continuous use of opioids       Relevant Orders   ToxASSURE Select 13 (MW), Urine   Chronic pain syndrome       Relevant Orders   ToxASSURE Select 13 (MW), Urine   Cervicalgia            ----------------------------------------------------------------------------------------------------------------------  1. DDD (degenerative disc disease), lumbar We will proceed with an epidural steroid injection as reviewed with her previously.  The risks and benefits of been reviewed in full detail and all questions answered.  She is scheduled for return to clinic in 2 months at which point we will proceed with a repeat epidural if indicated for pain relief.  2. Sciatica of left side As above  3. Primary osteoarthritis involving multiple joints As above and continue baseline opioid therapy  4. Low back pain at multiple sites As above  5. Chronic, continuous use of opioids As above and UDS has been requested for routine monitoring - ToxASSURE Select 13 (MW), Urine  6. Facet arthritis of lumbar region As above continue core stretching  strengthening  7. Chronic pain syndrome  - ToxASSURE Select 13 (MW), Urine  8. Cervicalgia     ----------------------------------------------------------------------------------------------------------------------  I am having Nobie Putnam. [place] "[name]" maintain her amphetamine-dextroamphetamine, busPIRone, sertraline, hydrOXYzine, naloxone, clonazePAM, ARIPiprazole, traZODone, Biotin, cyclobenzaprine, tiotropium, dexlansoprazole, promethazine-dextromethorphan, fluticasone, loratadine, azithromycin, albuterol, ibuprofen, PROAIR HFA, Oxycodone HCl, and Oxycodone HCl. We administered triamcinolone acetonide, sodium chloride flush, ropivacaine (PF) 2 mg/mL (0.2%), lidocaine (PF), and iopamidol.   Meds ordered this encounter  Medications  . triamcinolone acetonide (KENALOG-40) injection 40 mg  . sodium chloride flush (NS) 0.9 % injection 10 mL  . ropivacaine (PF) 2 mg/mL (0.2%) (NAROPIN) injection 10 mL  . lidocaine (PF) (XYLOCAINE) 1 % injection 5 mL  . iopamidol (ISOVUE-M) 41 % intrathecal injection 20 mL   Patient's Medications  New Prescriptions   No medications on file  Previous Medications   ALBUTEROL (PROVENTIL) (2.5 MG/3ML) 0.083% NEBULIZER SOLUTION    Take 3 mLs (2.5 mg total) by nebulization every 6 (six) hours as needed for wheezing or shortness of breath.   AMPHETAMINE-DEXTROAMPHETAMINE (ADDERALL) 30 MG TABLET    Take 30 mg by mouth 2 (two) times daily.   ARIPIPRAZOLE (ABILIFY) 5 MG TABLET    Take 5 mg by mouth daily.    AZITHROMYCIN (ZITHROMAX) 250 MG TABLET    2 tablets first day and one tablet for the next 4 days.   BIOTIN 5 MG TABS    Take 1 tablet by mouth daily.   BUSPIRONE (BUSPAR) 30 MG TABLET    Take 30 mg by mouth 2 (two) times daily.    CLONAZEPAM (KLONOPIN) 1 MG TABLET    Take 2 mg by mouth 3 (three) times daily.   CYCLOBENZAPRINE (FLEXERIL) 5 MG TABLET    Take 1 tablet (5 mg total) by mouth 3 (three) times daily as needed for muscle spasms.   DEXLANSOPRAZOLE  (DEXILANT) 60 MG CAPSULE    Take 1 capsule (60 mg total) by mouth daily.   FLUTICASONE (FLONASE) 50 MCG/ACT NASAL SPRAY    Place 2 sprays into both nostrils daily.   HYDROXYZINE (ATARAX/VISTARIL) 25 MG TABLET    Take 25 mg by mouth every 6 (six) hours as needed for anxiety or itching (hives).  IBUPROFEN (ADVIL,MOTRIN) 800 MG TABLET    Take 1 tablet (800 mg total) by mouth every 8 (eight) hours as needed.   LORATADINE (CLARITIN) 10 MG TABLET    Take 1 tablet (10 mg total) by mouth daily.   NALOXONE (NARCAN) NASAL SPRAY 4 MG/0.1 ML    For respiratory suppression from opioids   OXYCODONE HCL 10 MG TABS    Take 1 tablet (10 mg total) by mouth 4 (four) times daily for 30 days.   OXYCODONE HCL 10 MG TABS    Take 1 tablet (10 mg total) by mouth 4 (four) times daily for 30 days.   PROAIR HFA 108 (90 BASE) MCG/ACT INHALER    USE 2 PUFFS BY MOUTH 4 TIMES A DAY   PROMETHAZINE-DEXTROMETHORPHAN (PROMETHAZINE-DM) 6.25-15 MG/5ML SYRUP    Take 2.5 mLs by mouth 4 (four) times daily as needed for cough.   SERTRALINE (ZOLOFT) 100 MG TABLET    Take 100 mg by mouth 2 (two) times daily.   TIOTROPIUM (SPIRIVA HANDIHALER) 18 MCG INHALATION CAPSULE    Place 1 capsule (18 mcg total) into inhaler and inhale daily.   TRAZODONE (DESYREL) 100 MG TABLET    Take 300 mg by mouth at bedtime.  Modified Medications   No medications on file  Discontinued Medications   No medications on file   ----------------------------------------------------------------------------------------------------------------------  Follow-up: Return for evaluation, med refill.   Procedure: L5-S1 LESI with fluoroscopic guidance and no moderate sedation  NOTE: The risks, benefits, and expectations of the procedure have been discussed and explained to the patient who was understanding and in agreement with suggested treatment plan. No guarantees were made.  DESCRIPTION OF PROCEDURE: Lumbar epidural steroid injection with no IV Versed, EKG, blood  pressure, pulse, and pulse oximetry monitoring. The procedure was performed with the patient in the prone position under fluoroscopic guidance.  Sterile prep x3 was initiated and I then injected subcutaneous lidocaine to the overlying L5-S1 site after its fluoroscopic identifictation.  Using strict aseptic technique, I then advanced an 18-gauge Tuohy epidural needle in the midline using interlaminar approach via loss-of-resistance to saline technique. There was negative aspiration for heme or  CSF.  I then confirmed position with both AP and Lateral fluoroscan.  2 cc of Isovue were injected and a  total of 5 mL of Preservative-Free normal saline mixed with 40 mg of Kenalog and 1cc Ropicaine 0.2 percent were injected incrementally via the  epidurally placed needle. The needle was removed. The patient tolerated the injection well and was convalesced and discharged to home in stable condition. Should the patient have any post procedure difficulty they have been instructed on how to contact us for assistance.    Molli Barrows, MD

## 2018-06-28 ENCOUNTER — Telehealth: Payer: Self-pay | Admitting: *Deleted

## 2018-06-28 DIAGNOSIS — Z79899 Other long term (current) drug therapy: Secondary | ICD-10-CM | POA: Diagnosis not present

## 2018-06-28 NOTE — Telephone Encounter (Signed)
Denies complications post procedure. 

## 2018-06-29 DIAGNOSIS — J449 Chronic obstructive pulmonary disease, unspecified: Secondary | ICD-10-CM | POA: Diagnosis not present

## 2018-06-29 DIAGNOSIS — G4733 Obstructive sleep apnea (adult) (pediatric): Secondary | ICD-10-CM | POA: Diagnosis not present

## 2018-07-01 LAB — TOXASSURE SELECT 13 (MW), URINE

## 2018-07-10 ENCOUNTER — Ambulatory Visit (INDEPENDENT_AMBULATORY_CARE_PROVIDER_SITE_OTHER): Payer: Medicare Other

## 2018-07-10 VITALS — BP 108/72 | HR 70 | Temp 97.7°F | Resp 16 | Ht 64.0 in | Wt 252.3 lb

## 2018-07-10 DIAGNOSIS — Z1231 Encounter for screening mammogram for malignant neoplasm of breast: Secondary | ICD-10-CM

## 2018-07-10 DIAGNOSIS — Z0000 Encounter for general adult medical examination without abnormal findings: Secondary | ICD-10-CM

## 2018-07-10 NOTE — Progress Notes (Signed)
Subjective:   Wendy Johnson is a 53 y.o. female who presents for Medicare Annual (Subsequent) preventive examination.  Review of Systems:  Cardiac Risk Factors include: dyslipidemia;hypertension;obesity (BMI >30kg/m2);smoking/ tobacco exposure     Objective:     Vitals: BP 108/72 (BP Location: Left Arm, Patient Position: Sitting, Cuff Size: Large)   Pulse 70   Temp 97.7 F (36.5 C) (Oral)   Resp 16   Ht 5\' 4"  (1.626 m)   Wt 252 lb 4.8 oz (114.4 kg)   LMP 09/26/2014 Comment: Total  SpO2 96%   BMI 43.31 kg/m   Body mass index is 43.31 kg/m.  Advanced Directives 07/10/2018 02/08/2018 12/26/2017 12/20/2017 12/12/2017 10/06/2017 07/07/2017  Does Patient Have a Medical Advance Directive? No No No No No No No  Would patient like information on creating a medical advance directive? Yes (MAU/Ambulatory/Procedural Areas - Information given) No - Patient declined No - Patient declined No - Patient declined No - Patient declined No - Patient declined Yes (MAU/Ambulatory/Procedural Areas - Information given)    Tobacco Social History   Tobacco Use  Smoking Status Current Every Day Smoker  . Packs/day: 0.50  . Years: 37.00  . Pack years: 18.50  . Types: Cigarettes  . Start date: 11/26/1979  Smokeless Tobacco Never Used     Ready to quit: No Counseling given: Not Answered   Clinical Intake:  Pre-visit preparation completed: Yes  Pain : No/denies pain     BMI - recorded: 43.31 Nutritional Status: BMI > 30  Obese Nutritional Risks: None Diabetes: No  How often do you need to have someone help you when you read instructions, pamphlets, or other written materials from your doctor or pharmacy?: 1 - Never What is the last grade level you completed in school?: 10th grade  Interpreter Needed?: No  Information entered by :: Clemetine Marker LPN  Past Medical History:  Diagnosis Date  . ADHD (attention deficit hyperactivity disorder)   . Anxiety   . AR (allergic rhinitis)   .  Arthritis   . Benign hypertension    NO MEDS  . Bipolar disorder ([place])   . Chronic back pain   . Chronic constipation   . Chronic insomnia   . COPD (chronic obstructive pulmonary disease) ([place])   . Deaf    RIGHT EAR  . Decreased dorsalis pedis pulse   . Depression   . Diabetes mellitus without complication ([name])    history of DM but no longer has  . Dyslipidemia   . Fatty liver   . GERD (gastroesophageal reflux disease)   . Hepatomegaly   . High cholesterol   . Hot flashes   . Migraine with aura   . OCD (obsessive compulsive disorder)   . Plantar warts   . Severe obesity ([place])   . Shortness of breath dyspnea   . Sleep apnea    NO CPAP  . Tobacco use   . Trochanteric bursitis of right hip    Past Surgical History:  Procedure Laterality Date  . ABDOMINAL HYSTERECTOMY N/A 10/20/2014   Procedure: Total abdominial hysterectomy, bilateral salpingo-oophorectomy;  Surgeon: Brayton Mars, MD;  Location: [facility] ORS;  Service: Gynecology;  Laterality: N/A;  . BILATERAL SALPINGOOPHORECTOMY    . bone spurs removed Bilateral   . CARPAL TUNNEL RELEASE Left 06/08/2017   Procedure: CARPAL TUNNEL RELEASE;  Surgeon: Hessie Knows, MD;  Location: [facility];  Service: Orthopedics;  Laterality: Left;  . CARPAL TUNNEL RELEASE Right 10/10/2017   Procedure: CARPAL  TUNNEL RELEASE;  Surgeon: Hessie Knows, MD;  Location: [facility];  Service: Orthopedics;  Laterality: Right;  . COLONOSCOPY WITH PROPOFOL N/A 09/01/2016   Procedure: COLONOSCOPY WITH PROPOFOL;  Surgeon: Jonathon Bellows, MD;  Location: [facility] ENDOSCOPY;  Service: Endoscopy;  Laterality: N/A;  . DORSAL COMPARTMENT RELEASE Left 06/08/2017   Procedure: RELEASE DORSAL COMPARTMENT (DEQUERVAIN);  Surgeon: Hessie Knows, MD;  Location: [facility] ORS;  Service: Orthopedics;  Laterality: Left;  . ESOPHAGOGASTRODUODENOSCOPY (EGD) WITH PROPOFOL N/A 09/01/2016   Procedure: ESOPHAGOGASTRODUODENOSCOPY (EGD) WITH PROPOFOL;  Surgeon: Jonathon Bellows, MD;  Location: [facility]  ENDOSCOPY;  Service: Endoscopy;  Laterality: N/A;  . FOOT SURGERY Bilateral   . INSERTION OF MESH N/A 12/26/2017   Procedure: INSERTION OF MESH;  Surgeon: Jules Husbands, MD;  Location: [facility] ORS;  Service: General;  Laterality: N/A;  . JOINT REPLACEMENT Right    Total Knee replacement X 2  . JOINT REPLACEMENT Bilateral    Total Hip Replacement  . knee arthroscopo Right   . LAPAROSCOPY  09/22/2014   Procedure: LAPAROSCOPY OPERATIVE;  Surgeon: Brayton Mars, MD;  Location: [facility] ORS;  Service: Gynecology;;  excision and fulgeration of endomertriosis  . LIPOMA EXCISION    . ROBOTIC ASSISTED LAPAROSCOPIC VENTRAL/INCISIONAL HERNIA REPAIR N/A 12/26/2017   Procedure: ROBOTIC ASSISTED LAPAROSCOPIC VENTRAL/INCISIONAL HERNIA REPAIR;  Surgeon: Jules Husbands, MD;  Location: [facility] ORS;  Service: General;  Laterality: N/A;  . TONSILLECTOMY    . TOTAL HIP ARTHROPLASTY Left 12/16/2014   Procedure: TOTAL HIP ARTHROPLASTY ANTERIOR APPROACH;  Surgeon: Hessie Knows, MD;  Location: [facility] ORS;  Service: Orthopedics;  Laterality: Left;  . TOTAL HIP ARTHROPLASTY Right 05/12/2015   Procedure: TOTAL HIP ARTHROPLASTY ANTERIOR APPROACH;  Surgeon: Hessie Knows, MD;  Location: [facility] OR[facility];  Service: Orthopedics;  Laterality: Right;  . TOTAL KNEE REVISION Right 09/22/2015   Procedure: TOTAL KNEE REVISION/ REVISE POLYIETHYLENE;  Surgeon: Hessie Knows, MD;  Location: [facility];  Service: Orthopedics;  Laterality: Right;  . TOTAL KNEE REVISION Right 10/11/2016   Procedure: TOTAL KNEE REVISION;  Surgeon: Hessie Knows, MD;  Location: [facility] ORS;  Service: Orthopedics;  Laterality: Right;  . TUBAL LIGATION     Family History  Problem Relation Age of Onset  . Diabetes Mother   . Heart disease Father   . Heart attack Father   . Diabetes Sister   . Breast cancer Maternal Aunt        <50  . Breast cancer Paternal Aunt        x2.  <50  . Healthy Son   . Drug abuse Sister    Social History   Socioeconomic History  . Marital  status: Divorced    Spouse name: Not on file  . Number of children: 1  . Years of education: Not on file  . Highest education level: 10th grade  Occupational History  . Occupation: disabled  Social Needs  . Financial resource strain: Not hard at all  . Food insecurity:    Worry: Never true    Inability: Never true  . Transportation needs:    Medical: No    Non-medical: No  Tobacco Use  . Smoking status: Current Every Day Smoker    Packs/day: 0.50    Years: 37.00    Pack years: 18.50    Types: Cigarettes    Start date: 11/26/1979  . Smokeless tobacco: Never Used  Substance and Sexual Activity  . Alcohol use: No    Alcohol/week: 0.0 standard drinks    Comment: but  used to be a heavy drinker, quit after DUI 2021  . Drug use: No    Comment: quit crack cocaine 2004  . Sexual activity: Yes    Partners: Male    Birth control/protection: None, Surgical  Lifestyle  . Physical activity:    Days per week: 3 days    Minutes per session: 60 min  . Stress: Very much  Relationships  . Social connections:    Talks on phone: More than three times a week    Gets together: Twice a week    Attends religious service: Never    Active member of club or organization: Patient refused    Attends meetings of clubs or organizations: Patient refused    Relationship status: Divorced  Other Topics Concern  . Not on file  Social History Narrative   Lives with boyfriend   Disable from psychiatric disease    Outpatient Encounter Medications as of 07/10/2018  Medication Sig  . amphetamine-dextroamphetamine (ADDERALL) 15 MG tablet Take 1 tablet by mouth daily.  Marland Kitchen amphetamine-dextroamphetamine (ADDERALL) 30 MG tablet Take 30 mg by mouth 2 (two) times daily.  . busPIRone (BUSPAR) 30 MG tablet Take 30 mg by mouth 2 (two) times daily.   . clonazePAM (KLONOPIN) 1 MG tablet Take 2 mg by mouth 3 (three) times daily.  . cyclobenzaprine (FLEXERIL) 5 MG tablet Take 1 tablet (5 mg total) by mouth 3 (three)  times daily as needed for muscle spasms.  Marland Kitchen dexlansoprazole (DEXILANT) 60 MG capsule Take 1 capsule (60 mg total) by mouth daily.  . hydrOXYzine (ATARAX/VISTARIL) 25 MG tablet Take 25 mg by mouth every 6 (six) hours as needed for anxiety or itching (hives).  Marland Kitchen ibuprofen (ADVIL,MOTRIN) 800 MG tablet Take 1 tablet (800 mg total) by mouth every 8 (eight) hours as needed.  Marland Kitchen LATUDA 40 MG TABS tablet   . Oxycodone HCl 10 MG TABS Take 1 tablet (10 mg total) by mouth 4 (four) times daily for 30 days.  Marland Kitchen PROAIR HFA 108 (90 Base) MCG/ACT inhaler USE 2 PUFFS BY MOUTH 4 TIMES A DAY  . sertraline (ZOLOFT) 100 MG tablet Take 100 mg by mouth 2 (two) times daily.  Marland Kitchen tiotropium (SPIRIVA HANDIHALER) 18 MCG inhalation capsule Place 1 capsule (18 mcg total) into inhaler and inhale daily.  . traZODone (DESYREL) 100 MG tablet Take 300 mg by mouth at bedtime.  Marland Kitchen albuterol (PROVENTIL) (2.5 MG/3ML) 0.083% nebulizer solution Take 3 mLs (2.5 mg total) by nebulization every 6 (six) hours as needed for wheezing or shortness of breath. (Patient not taking: Reported on 07/10/2018)  . naloxone (NARCAN) nasal spray 4 mg/0.1 mL For respiratory suppression from opioids (Patient not taking: Reported on 07/10/2018)  . [START ON 07/21/2018] Oxycodone HCl 10 MG TABS Take 1 tablet (10 mg total) by mouth 4 (four) times daily for 30 days.  . [DISCONTINUED] ARIPiprazole (ABILIFY) 5 MG tablet Take 5 mg by mouth daily.   . [DISCONTINUED] azithromycin (ZITHROMAX) 250 MG tablet 2 tablets first day and one tablet for the next 4 days. (Patient not taking: Reported on 02/08/2018)  . [DISCONTINUED] Biotin 5 MG TABS Take 1 tablet by mouth daily.  . [DISCONTINUED] fluticasone (FLONASE) 50 MCG/ACT nasal spray Place 2 sprays into both nostrils daily. (Patient not taking: Reported on 07/10/2018)  . [DISCONTINUED] loratadine (CLARITIN) 10 MG tablet Take 1 tablet (10 mg total) by mouth daily.  . [DISCONTINUED] promethazine-dextromethorphan (PROMETHAZINE-DM)  6.25-15 MG/5ML syrup Take 2.5 mLs by mouth 4 (four) times daily  as needed for cough.   No facility-administered encounter medications on file as of 07/10/2018.     Activities of Daily Living In your present state of health, do you have any difficulty performing the following activities: 07/10/2018 02/02/2018  Hearing? Y N  Comment deaf in right ear -  Vision? Y N  Comment waiting for glasses to come in at eye dr -  Difficulty concentrating or making decisions? N N  Walking or climbing stairs? N Y  Dressing or bathing? N N  Doing errands, shopping? N [place] and eating ? N -  Using the Toilet? N -  In the past six months, have you accidently leaked urine? N -  Do you have problems with loss of bowel control? N -  Managing your Medications? N -  Managing your Finances? N -  Housekeeping or managing your Housekeeping? N -  Some recent data might be hidden    Patient Care Team: Steele Sizer, MD as PCP - General (Family Medicine) Myer Haff, MD as Consulting Physician (Psychiatry) Andree Elk Alvina Filbert, MD as Consulting Physician (Anesthesiology) Jonathon Bellows, MD as Consulting Physician (Gastroenterology) Hessie Knows, MD as Consulting Physician (Orthopedic Surgery) Jules Husbands, MD as Consulting Physician (General Surgery)    Assessment:   This is a routine wellness examination for [place].  Exercise Activities and Dietary recommendations Current Exercise Habits: Structured exercise class, Type of exercise: strength training/weights;treadmill, Time (Minutes): 60, Frequency (Times/Week): 3, Weekly Exercise (Minutes/Week): 180, Intensity: Moderate, Exercise limited by: orthopedic condition(s);respiratory conditions(s)  Goals    . Have 3 meals a day     Recommend to decrease portion sizes by eating 3 small healthy meals and at least 2 healthy snacks per day.           Fall Risk Fall Risk  07/10/2018 06/27/2018 02/08/2018 02/02/2018 01/22/2018  Falls in the past  year? 0 0 No No No  Number falls in past yr: 0 - - - -  Injury with Fall? 0 - - - -  Comment - - - - -  Risk for fall due to : - - - - -  Follow up Falls prevention discussed - - - -  Comment - - - - -   FALL RISK PREVENTION PERTAINING TO THE HOME:  Any stairs in or around the home? Yes  If so, do they handrails? Yes   Home free of loose throw rugs in walkways, pet beds, electrical cords, etc? Yes  Adequate lighting in your home to reduce risk of falls? Yes   ASSISTIVE DEVICES UTILIZED TO PREVENT FALLS:  Life alert? No  Use of a cane, walker or w/c? No  Grab bars in the bathroom? Yes  Shower chair or bench in shower? Yes  Elevated toilet seat or a handicapped toilet? Yes   DME ORDERS:  DME order needed?  No   TIMED UP AND GO:  Was the test performed? Yes .  Length of time to ambulate 10 feet: 5 sec.   GAIT:  Appearance of gait: Gait stead-fast and without the use of an assistive device.    Education: Fall risk prevention has been discussed.  Intervention(s) required? No    Depression Screen PHQ 2/9 Scores 07/10/2018 06/27/2018 02/08/2018 02/02/2018  PHQ - 2 Score 1 0 0 0  PHQ- 9 Score - - 0 2  Exception Documentation - - - -     Cognitive Function     6CIT Screen 07/10/2018  07/07/2017  What Year? 0 points 0 points  What month? 0 points 0 points  What time? 0 points 0 points  Count back from 20 0 points 0 points  Months in reverse 0 points 0 points  Repeat phrase 0 points 4 points  Total Score 0 4    Immunization History  Administered Date(s) Administered  . Influenza,inj,Quad PF,6+ Mos 02/03/2016, 01/19/2017, 01/22/2018  . Influenza-Unspecified 01/09/2014, 02/22/2015  . Pneumococcal Conjugate-13 01/09/2014  . Pneumococcal Polysaccharide-23 11/09/2009  . Tdap 11/19/2009    Qualifies for Shingles Vaccine? Yes  Zostavax completed per patient. Due for Shingrix. Education has been provided regarding the importance of this vaccine. Pt has been advised to call  insurance company to determine out of pocket expense. Advised may also receive vaccine at local pharmacy or Health Dept. Verbalized acceptance and understanding.  Tdap: Up to date  Flu Vaccine: Up to date  Pneumococcal Vaccine: Up to date   Screening Tests Health Maintenance  Topic Date Due  . MAMMOGRAM  05/10/2017  . HEMOGLOBIN A1C  07/23/2018  . FOOT EXAM  01/23/2019  . URINE MICROALBUMIN  01/23/2019  . OPHTHALMOLOGY EXAM  06/09/2019  . COLONOSCOPY  09/02/2019  . TETANUS/TDAP  11/20/2019  . INFLUENZA VACCINE  Completed  . PNEUMOCOCCAL POLYSACCHARIDE VACCINE AGE 66-64 HIGH RISK  Completed  . HIV Screening  Completed    Cancer Screenings:  Colorectal Screening: Completed 09/01/16. Repeat every 3 years.  Mammogram: Completed 05/10/16. Repeat every year. Ordered today. Pt provided with contact information and advised to call to schedule appt.   Lung Cancer Screening: (Low Dose CT Chest recommended if Age 76-80 years, 30 pack-year currently smoking OR have quit w/in 15years.) does not qualify.    Additional Screening:  Hepatitis C Screening: does qualify; postponed  Vision Screening: Recommended annual ophthalmology exams for early detection of glaucoma and other disorders of the eye. Is the patient up to date with their annual eye exam?  Yes  Who is the provider or what is the name of the office in which the pt attends annual eye exams? [place] Screening: Recommended annual dental exams for proper oral hygiene  Community Resource Referral:  CRR required this visit?  No      Plan:    I have personally reviewed and addressed the Medicare Annual Wellness questionnaire and have noted the following in the patient's chart:  A. Medical and social history B. Use of alcohol, tobacco or illicit drugs  C. Current medications and supplements D. Functional ability and status E.  Nutritional status F.  Physical activity G. Advance directives H. List of other  physicians I.  Hospitalizations, surgeries, and ER visits in previous 12 months J.  [place] such as hearing and vision if needed, cognitive and depression L. Referrals and appointments   In addition, I have reviewed and discussed with patient certain preventive protocols, quality metrics, and best practice recommendations. A written personalized care plan for preventive services as well as general preventive health recommendations were provided to patient.   Signed,  Clemetine Marker, LPN Nurse Health Advisor   Nurse Notes: none

## 2018-07-10 NOTE — Patient Instructions (Signed)
Wendy Johnson , Thank you for taking time to come for your Medicare Wellness Visit. I appreciate your ongoing commitment to your health goals. Please review the following plan we discussed and let me know if I can assist you in the future.   Screening recommendations/referrals: Colonoscopy: done 09/01/16. Repeat in 2021. Mammogram: done 05/10/16. Please call [phone] to schedule your mammogram.  Recommended yearly ophthalmology/optometry visit for glaucoma screening and checkup Recommended yearly dental visit for hygiene and checkup  Vaccinations: Influenza vaccine: done 01/22/18 Pneumococcal vaccine: done 02/03/16 Tdap vaccine: done 11/19/09 Shingles vaccine: Shingrix discussed. Please contact your pharmacy for coverage information.   Advanced directives: Advance directive discussed with you today. I have provided a copy for you to complete at home and have notarized. Once this is complete please bring a copy in to our office so we can scan it into your chart.  Conditions/risks identified: Continue healthy eating and physical activity.  Next appointment: 07/26/18 9:00 Dr. Ancil Boozer  Preventive Care 40-64 Years, Female Preventive care refers to lifestyle choices and visits with your health care provider that can promote health and wellness. What does preventive care include?  A yearly physical exam. This is also called an annual well check.  Dental exams once or twice a year.  Routine eye exams. Ask your health care provider how often you should have your eyes checked.  Personal lifestyle choices, including:  Daily care of your teeth and gums.  Regular physical activity.  Eating a healthy diet.  Avoiding tobacco and drug use.  Limiting alcohol use.  Practicing safe sex.  Taking low-dose aspirin daily starting at age 4.  Taking vitamin and mineral supplements as recommended by your health care provider. What happens during an annual well check? The services and screenings done  by your health care provider during your annual well check will depend on your age, overall health, lifestyle risk factors, and family history of disease. Counseling  Your health care provider may ask you questions about your:  Alcohol use.  Tobacco use.  Drug use.  Emotional well-being.  Home and relationship well-being.  Sexual activity.  Eating habits.  Work and work Statistician.  Method of birth control.  Menstrual cycle.  Pregnancy history. Screening  You may have the following tests or measurements:  Height, weight, and BMI.  Blood pressure.  Lipid and cholesterol levels. These may be checked every 5 years, or more frequently if you are over 2 years old.  Skin check.  Lung cancer screening. You may have this screening every year starting at age 82 if you have a 30-pack-year history of smoking and currently smoke or have quit within the past 15 years.  Fecal occult blood test (FOBT) of the stool. You may have this test every year starting at age 41.  Flexible sigmoidoscopy or colonoscopy. You may have a sigmoidoscopy every 5 years or a colonoscopy every 10 years starting at age 80.  Hepatitis C blood test.  Hepatitis B blood test.  Sexually transmitted disease (STD) testing.  Diabetes screening. This is done by checking your blood sugar (glucose) after you have not eaten for a while (fasting). You may have this done every 1-3 years.  Mammogram. This may be done every 1-2 years. Talk to your health care provider about when you should start having regular mammograms. This may depend on whether you have a family history of breast cancer.  BRCA-related cancer screening. This may be done if you have a family history of breast, ovarian, tubal,  or peritoneal cancers.  Pelvic exam and Pap test. This may be done every 3 years starting at age 64. Starting at age 15, this may be done every 5 years if you have a Pap test in combination with an HPV test.  Bone density  scan. This is done to screen for osteoporosis. You may have this scan if you are at high risk for osteoporosis. Discuss your test results, treatment options, and if necessary, the need for more tests with your health care provider. Vaccines  Your health care provider may recommend certain vaccines, such as:  Influenza vaccine. This is recommended every year.  Tetanus, diphtheria, and acellular pertussis (Tdap, Td) vaccine. You may need a Td booster every 10 years.  Zoster vaccine. You may need this after age 33.  Pneumococcal 13-valent conjugate (PCV13) vaccine. You may need this if you have certain conditions and were not previously vaccinated.  Pneumococcal polysaccharide (PPSV23) vaccine. You may need one or two doses if you smoke cigarettes or if you have certain conditions. Talk to your health care provider about which screenings and vaccines you need and how often you need them. This information is not intended to replace advice given to you by your health care provider. Make sure you discuss any questions you have with your health care provider. Document Released: 05/22/2015 Document Revised: 01/13/2016 Document Reviewed: 02/24/2015 Elsevier Interactive Patient Education  2017 [employer] Prevention in the Home Falls can cause injuries. They can happen to people of all ages. There are many things you can do to make your home safe and to help prevent falls. What can I do on the outside of my home?  Regularly fix the edges of walkways and driveways and fix any cracks.  Remove anything that might make you trip as you walk through a door, such as a raised step or threshold.  Trim any bushes or trees on the path to your home.  Use bright outdoor lighting.  Clear any walking paths of anything that might make someone trip, such as rocks or tools.  Regularly check to see if handrails are loose or broken. Make sure that both sides of any steps have handrails.  Any raised  decks and porches should have guardrails on the edges.  Have any leaves, snow, or ice cleared regularly.  Use sand or salt on walking paths during winter.  Clean up any spills in your garage right away. This includes oil or grease spills. What can I do in the bathroom?  Use night lights.  Install grab bars by the toilet and in the tub and shower. Do not use towel bars as grab bars.  Use non-skid mats or decals in the tub or shower.  If you need to sit down in the shower, use a plastic, non-slip stool.  Keep the floor dry. Clean up any water that spills on the floor as soon as it happens.  Remove soap buildup in the tub or shower regularly.  Attach bath mats securely with double-sided non-slip rug tape.  Do not have throw rugs and other things on the floor that can make you trip. What can I do in the bedroom?  Use night lights.  Make sure that you have a light by your bed that is easy to reach.  Do not use any sheets or blankets that are too big for your bed. They should not hang down onto the floor.  Have a firm chair that has side arms. You  can use this for support while you get dressed.  Do not have throw rugs and other things on the floor that can make you trip. What can I do in the kitchen?  Clean up any spills right away.  Avoid walking on wet floors.  Keep items that you use a lot in easy-to-reach places.  If you need to reach something above you, use a strong step stool that has a grab bar.  Keep electrical cords out of the way.  Do not use floor polish or wax that makes floors slippery. If you must use wax, use non-skid floor wax.  Do not have throw rugs and other things on the floor that can make you trip. What can I do with my stairs?  Do not leave any items on the stairs.  Make sure that there are handrails on both sides of the stairs and use them. Fix handrails that are broken or loose. Make sure that handrails are as long as the stairways.  Check  any carpeting to make sure that it is firmly attached to the stairs. Fix any carpet that is loose or worn.  Avoid having throw rugs at the top or bottom of the stairs. If you do have throw rugs, attach them to the floor with carpet tape.  Make sure that you have a light switch at the top of the stairs and the bottom of the stairs. If you do not have them, ask someone to add them for you. What else can I do to help prevent falls?  Wear shoes that:  Do not have high heels.  Have rubber bottoms.  Are comfortable and fit you well.  Are closed at the toe. Do not wear sandals.  If you use a stepladder:  Make sure that it is fully opened. Do not climb a closed stepladder.  Make sure that both sides of the stepladder are locked into place.  Ask someone to hold it for you, if possible.  Clearly mark and make sure that you can see:  Any grab bars or handrails.  First and last steps.  Where the edge of each step is.  Use tools that help you move around (mobility aids) if they are needed. These include:  Canes.  Walkers.  Scooters.  Crutches.  Turn on the lights when you go into a dark area. Replace any light bulbs as soon as they burn out.  Set up your furniture so you have a clear path. Avoid moving your furniture around.  If any of your floors are uneven, fix them.  If there are any pets around you, be aware of where they are.  Review your medicines with your doctor. Some medicines can make you feel dizzy. This can increase your chance of falling. Ask your doctor what other things that you can do to help prevent falls. This information is not intended to replace advice given to you by your health care provider. Make sure you discuss any questions you have with your health care provider. Document Released: 02/19/2009 Document Revised: 10/01/2015 Document Reviewed: 05/30/2014 Elsevier Interactive Patient Education  2017 [employer].

## 2018-07-16 ENCOUNTER — Ambulatory Visit: Payer: Self-pay | Admitting: *Deleted

## 2018-07-16 ENCOUNTER — Encounter: Payer: Self-pay | Admitting: Family Medicine

## 2018-07-16 ENCOUNTER — Ambulatory Visit (INDEPENDENT_AMBULATORY_CARE_PROVIDER_SITE_OTHER): Payer: Medicare Other | Admitting: Family Medicine

## 2018-07-16 VITALS — BP 138/90 | HR 114 | Temp 98.0°F | Resp 18 | Ht 64.0 in | Wt 251.3 lb

## 2018-07-16 DIAGNOSIS — E669 Obesity, unspecified: Secondary | ICD-10-CM

## 2018-07-16 DIAGNOSIS — R05 Cough: Secondary | ICD-10-CM

## 2018-07-16 DIAGNOSIS — J441 Chronic obstructive pulmonary disease with (acute) exacerbation: Secondary | ICD-10-CM

## 2018-07-16 DIAGNOSIS — R059 Cough, unspecified: Secondary | ICD-10-CM

## 2018-07-16 DIAGNOSIS — E1169 Type 2 diabetes mellitus with other specified complication: Secondary | ICD-10-CM

## 2018-07-16 MED ORDER — PREDNISONE 20 MG PO TABS: 20.0000 mg | ORAL_TABLET | Freq: Two times a day (BID) | ORAL | 0 refills | Status: AC

## 2018-07-16 MED ORDER — AZITHROMYCIN 250 MG PO TABS: ORAL_TABLET | ORAL | 0 refills | Status: DC

## 2018-07-16 MED ORDER — GUAIFENESIN ER 600 MG PO TB12: 600.0000 mg | ORAL_TABLET | Freq: Two times a day (BID) | ORAL | 0 refills | Status: DC | PRN

## 2018-07-16 NOTE — Progress Notes (Signed)
Name: Wendy Johnson   MRN: [id number]    DOB: August 01, 1965   Date:07/16/2018       Progress Note  Subjective  Chief Complaint  Chief Complaint  Patient presents with  . Shortness of Breath  . Wheezing  . Sinus Problem    sudafed  . Cough    mucinez    HPI  Pt presents with concern for shortness of breath, chest congestion, and wheezing for 2 days.  She does have COPD and is using Spiriva daily and albuterol PRN without relief of symptoms.  She also endorses nasal congestion, ear pressure, and sore throat.  She has albuterol neb at home, but needs new equipment/mouthpiece and cord for her nebulizer.  DM: She has T2DM, but A1C's have been very well controlled in the 5-6% range for quite some times, so we will consider prednisone treatment at this time. Reviewed signs and symptoms on hyperglycemia including polydipsia, polyphagia, and polyuria.  Sick day care discussed in detail.  Pt asks for Vicodin for her body aches. Explained that this is not an appropriate medication for her current illness.  She sees pain management, and has run out of her medication (has appointment in a week).  I advised that she could be in breach of her pain management contract if she obtains narcotic pain medication from someone other than her pain management provider.  She verbalizes understanding.   Patient Active Problem List   Diagnosis Date Noted  . Lumbar spondylosis 03/19/2018  . Polyarthralgia 03/19/2018  . History of alcoholism ([place]) 07/19/2017  . Bilateral carpal tunnel syndrome 05/29/2017  . Painful total knee replacement, right ([place]) 10/11/2016  . Rotator cuff tendinitis, right 07/29/2016  . Occipital neuralgia 10/12/2015  . Instability of prosthetic knee ([place]) 09/22/2015  . Primary osteoarthritis of right hip 05/12/2015  . Sleep apnea 04/07/2015  . Primary osteoarthritis of left hip 12/16/2014  . Metabolic syndrome 17/71/1657  . Migraine without aura and without status migrainosus, not  intractable 11/26/2014  . GERD without esophagitis 11/26/2014  . COPD, moderate ([place]) 11/26/2014  . Nocturnal oxygen desaturation 11/26/2014  . Supplemental oxygen dependent 11/26/2014  . Hearing loss 11/26/2014  . Pain syndrome, chronic 11/26/2014  . History of hypertension 11/26/2014  . Dyslipidemia 11/26/2014  . Morbid obesity ([place]) 11/26/2014  . Dyslipidemia associated with type 2 diabetes mellitus ([place]) 11/26/2014  . Chronic constipation 11/26/2014  . Generalized anxiety disorder 11/11/2014  . DDD (degenerative disc disease), lumbar 11/11/2014  . Facet arthritis of lumbar region 11/11/2014  . Primary osteoarthritis involving multiple joints 11/11/2014  . H/O hysterectomy for benign disease 10/20/2014  . Low back derangement syndrome 09/30/2014  . Bipolar disorder ([place]) 09/30/2014    Social History   Tobacco Use  . Smoking status: Current Every Day Smoker    Packs/day: 0.50    Years: 37.00    Pack years: 18.50    Types: Cigarettes    Start date: 11/26/1979  . Smokeless tobacco: Never Used  Substance Use Topics  . Alcohol use: No    Alcohol/week: 0.0 standard drinks    Comment: but used to be a heavy drinker, quit after DUI 2021     Current Outpatient Medications:  .  albuterol (PROVENTIL) (2.5 MG/3ML) 0.083% nebulizer solution, Take 3 mLs (2.5 mg total) by nebulization every 6 (six) hours as needed for wheezing or shortness of breath. (Patient not taking: Reported on 07/10/2018), Disp: 75 mL, Rfl: 1 .  amphetamine-dextroamphetamine (ADDERALL) 15 MG tablet, Take 1 tablet  by mouth daily., Disp: , Rfl:  .  amphetamine-dextroamphetamine (ADDERALL) 30 MG tablet, Take 30 mg by mouth 2 (two) times daily., Disp: , Rfl:  .  busPIRone (BUSPAR) 30 MG tablet, Take 30 mg by mouth 2 (two) times daily. , Disp: , Rfl:  .  clonazePAM (KLONOPIN) 1 MG tablet, Take 2 mg by mouth 3 (three) times daily., Disp: , Rfl:  .  cyclobenzaprine (FLEXERIL) 5 MG tablet, Take 1 tablet (5 mg total) by  mouth 3 (three) times daily as needed for muscle spasms., Disp: 30 tablet, Rfl: 0 .  dexlansoprazole (DEXILANT) 60 MG capsule, Take 1 capsule (60 mg total) by mouth daily., Disp: 30 capsule, Rfl: 5 .  hydrOXYzine (ATARAX/VISTARIL) 25 MG tablet, Take 25 mg by mouth every 6 (six) hours as needed for anxiety or itching (hives)., Disp: , Rfl:  .  ibuprofen (ADVIL,MOTRIN) 800 MG tablet, Take 1 tablet (800 mg total) by mouth every 8 (eight) hours as needed., Disp: 30 tablet, Rfl: 0 .  LATUDA 40 MG TABS tablet, , Disp: , Rfl:  .  naloxone (NARCAN) nasal spray 4 mg/0.1 mL, For respiratory suppression from opioids (Patient not taking: Reported on 07/10/2018), Disp: 1 kit, Rfl: 2 .  Oxycodone HCl 10 MG TABS, Take 1 tablet (10 mg total) by mouth 4 (four) times daily for 30 days., Disp: 120 tablet, Rfl: 0 .  [START ON 07/21/2018] Oxycodone HCl 10 MG TABS, Take 1 tablet (10 mg total) by mouth 4 (four) times daily for 30 days., Disp: 120 tablet, Rfl: 0 .  PROAIR HFA 108 (90 Base) MCG/ACT inhaler, USE 2 PUFFS BY MOUTH 4 TIMES A DAY, Disp: 8.5 g, Rfl: 0 .  sertraline (ZOLOFT) 100 MG tablet, Take 100 mg by mouth 2 (two) times daily., Disp: , Rfl:  .  tiotropium (SPIRIVA HANDIHALER) 18 MCG inhalation capsule, Place 1 capsule (18 mcg total) into inhaler and inhale daily., Disp: 30 capsule, Rfl: 5 .  traZODone (DESYREL) 100 MG tablet, Take 300 mg by mouth at bedtime., Disp: , Rfl:   Allergies  Allergen Reactions  . Codeine Nausea Only  . Imitrex [Sumatriptan] Other (See Comments)    Chest pain    I personally reviewed active problem list, medication list, allergies, notes from last encounter, lab results with the patient/caregiver today.  ROS  Ten systems reviewed and is negative except as mentioned in HPI.  Objective  Vitals:   07/16/18 1029  BP: 138/90  Pulse: (!) 114  Resp: 18  Temp: 98 F (36.7 C)  TempSrc: Oral  SpO2: 99%  Weight: 251 lb 4.8 oz (114 kg)  Height: 5' 4"  (1.626 m)   Body mass  index is 43.14 kg/m.  Nursing Note and Vital Signs reviewed.  Physical Exam  Constitutional: Patient appears well-developed and well-nourished. Obese No distress.  HEENT: head atraumatic, normocephalic, pupils equal and reactive to light, Bilateral TM's without erythema or effusion,  bilateral maxillary and frontal sinuses are non-tender, neck supple without lymphadenopathy, throat within normal limits - no erythema or exudate, no tonsillar swelling Cardiovascular: Normal rate, regular rhythm and normal heart sounds.  No murmur heard. No BLE edema. Pulmonary/Chest: Effort normal and breath sounds with rhonchi and inspiratory wheezing throughout.  Congested cough is present. No respiratory distress. Abdominal: Soft, bowel sounds normal, there is no tenderness, no HSM Psychiatric: Patient has a normal mood and affect. behavior is normal. Judgment and thought content normal.   No results found for this or any previous visit (from the  past 72 hour(s)).  Assessment & Plan  1. COPD with acute exacerbation ([place]) - For home use only DME Nebulizer machine - ordered new tubing and mouthpiece. - predniSONE (DELTASONE) 20 MG tablet; Take 1 tablet (20 mg total) by mouth 2 (two) times daily with a meal for 5 days. Second dose before 2pm  Dispense: 10 tablet; Refill: 0 - azithromycin (ZITHROMAX) 250 MG tablet; Day1: Take 2 tablets once. Days 2-5: Take 1 tablet once daily  Dispense: 6 tablet; Refill: 0  2. Cough - May take Mucinex  3. Diabetes mellitus type 2 in obese [facility]) - Sick day care discussed in detail.  Reviewed signs and symptoms of hyperglycemia and discussed risk/benefit of taking prednisone.  -Red flags and when to present for emergency care or RTC including fever >101.16F, chest pain, shortness of breath, new/worsening/un-resolving symptoms, reviewed with patient at time of visit. Follow up and care instructions discussed and provided in AVS.

## 2018-07-16 NOTE — Telephone Encounter (Signed)
Pt called with complaints of wheezing and her lungs hurting; the pt says that she also has chest congestion; she says that she has been sick for 3 sick; the pt says that she has intermittent shortness of breath; the pt says that she is unable to use her nebulizer because she needs a new hose; she also complains of sinus pain and has taken Sudafed PE, tylenol cold and flu, and mucinex; she has a productive cough with brown mucus that is keeping her up at night; recommendations made per nurse triage protocol; she would like to be seen in the office; the pt normally sees Dr Ancil Boozer but this provider has no availability within the parameters per guidelines; pt offered and accepted appointment with Raelyn Ensign, [facility], 07/16/2018 at 1020; she verbalized understanding; will route to office for notification.   Reason for Disposition . [1] Longstanding difficulty breathing (e.g., CHF, COPD, emphysema) AND [2] WORSE than normal  Answer Assessment - Initial Assessment Questions 1. RESPIRATORY STATUS: "Describe your breathing?" (e.g., wheezing, shortness of breath, unable to speak, severe coughing)      Wheezing, intermittent shortness of breath 2. ONSET: "When did this breathing problem begin?"      07/13/2018 3. PATTERN "Does the difficult breathing come and go, or has it been constant since it started?"      intermittent 4. SEVERITY: "How bad is your breathing?" (e.g., mild, moderate, severe)    - MILD: No SOB at rest, mild SOB with walking, speaks normally in sentences, can lay down, no retractions, pulse < 100.    - MODERATE: SOB at rest, SOB with minimal exertion and prefers to sit, cannot lie down flat, speaks in phrases, mild retractions, audible wheezing, pulse 100-120.    - SEVERE: Very SOB at rest, speaks in single words, struggling to breathe, sitting hunched forward, retractions, pulse > 120      moderate 5. RECURRENT SYMPTOM: "Have you had difficulty breathing before?" If so, ask: "When was the  last time?" and "What happened that time?"      Pt has COPD 6. CARDIAC HISTORY: "Do you have any history of heart disease?" (e.g., heart attack, angina, bypass surgery, angioplasty)      no 7. LUNG HISTORY: "Do you have any history of lung disease?"  (e.g., pulmonary embolus, asthma, emphysema)     COPD 8. CAUSE: "What do you think is causing the breathing problem?"      Cold, and chest cold 9. OTHER SYMPTOMS: "Do you have any other symptoms? (e.g., dizziness, runny nose, cough, chest pain, fever)     Productive cough, runny nose, sinus pain behind eyes 10. PREGNANCY: "Is there any chance you are pregnant?" "When was your last menstrual period?"       No hysterctomy 11. TRAVEL: "Have you traveled out of the country in the last month?" (e.g., travel history, exposures)       no  Protocols used: BREATHING DIFFICULTY-A-AH

## 2018-07-17 ENCOUNTER — Telehealth: Payer: Self-pay | Admitting: Family Medicine

## 2018-07-17 NOTE — Telephone Encounter (Signed)
Kristeen Miss - I think this was done 07/16/2018 by you. Please let me know if a new order needs to be signed, and reprint the requisition so that I can sign. Thanks!

## 2018-07-17 NOTE — Telephone Encounter (Signed)
Copied from [place] [id number]. Topic: General - Inquiry >> Jul 17, 2018  3:39 PM Virl Axe D wrote: Reason for CRM: Pt called to follow up on OV on 07/16/18. Pt stated Raquel Sarna was supposed to send a request to [facility] regarding a new tube for pt's nebulizer. She stated as of today they have not received request. Please follow up with pt as she needs nebulizer to help with breathing. CB#[phone]

## 2018-07-18 DIAGNOSIS — M159 Polyosteoarthritis, unspecified: Secondary | ICD-10-CM | POA: Diagnosis not present

## 2018-07-18 DIAGNOSIS — M47816 Spondylosis without myelopathy or radiculopathy, lumbar region: Secondary | ICD-10-CM | POA: Diagnosis not present

## 2018-07-18 DIAGNOSIS — G894 Chronic pain syndrome: Secondary | ICD-10-CM | POA: Diagnosis not present

## 2018-07-18 DIAGNOSIS — M2550 Pain in unspecified joint: Secondary | ICD-10-CM | POA: Diagnosis not present

## 2018-07-18 NOTE — Telephone Encounter (Signed)
Cyril Mourning w/ [facility] states they need a signed order, and the demographics, then they can do this. CB  [phone]  Fax : [phone]

## 2018-07-18 NOTE — Telephone Encounter (Addendum)
I will refax order to [facility] today.  Patient will be informed once confirmation has been received.

## 2018-07-18 NOTE — Telephone Encounter (Signed)
Submitted as requested

## 2018-07-19 DIAGNOSIS — H524 Presbyopia: Secondary | ICD-10-CM | POA: Diagnosis not present

## 2018-07-26 ENCOUNTER — Ambulatory Visit: Payer: Medicare Other

## 2018-07-26 ENCOUNTER — Ambulatory Visit: Payer: Medicare Other | Admitting: Family Medicine

## 2018-07-28 DIAGNOSIS — J449 Chronic obstructive pulmonary disease, unspecified: Secondary | ICD-10-CM | POA: Diagnosis not present

## 2018-07-28 DIAGNOSIS — G4733 Obstructive sleep apnea (adult) (pediatric): Secondary | ICD-10-CM | POA: Diagnosis not present

## 2018-07-30 ENCOUNTER — Ambulatory Visit: Payer: Medicare Other | Admitting: Family Medicine

## 2018-08-06 ENCOUNTER — Other Ambulatory Visit: Payer: Self-pay | Admitting: Family Medicine

## 2018-08-06 DIAGNOSIS — K219 Gastro-esophageal reflux disease without esophagitis: Secondary | ICD-10-CM

## 2018-08-06 NOTE — Telephone Encounter (Signed)
Refill request for general medication. Dexilant to [facility]   Last office visit 07/16/2018   Follow up on 07/16/2019

## 2018-08-15 DIAGNOSIS — M7061 Trochanteric bursitis, right hip: Secondary | ICD-10-CM | POA: Diagnosis not present

## 2018-08-15 DIAGNOSIS — M25551 Pain in right hip: Secondary | ICD-10-CM | POA: Diagnosis not present

## 2018-08-15 DIAGNOSIS — M7062 Trochanteric bursitis, left hip: Secondary | ICD-10-CM | POA: Diagnosis not present

## 2018-08-15 DIAGNOSIS — M25552 Pain in left hip: Secondary | ICD-10-CM | POA: Diagnosis not present

## 2018-08-15 DIAGNOSIS — M25561 Pain in right knee: Secondary | ICD-10-CM | POA: Diagnosis not present

## 2018-08-15 DIAGNOSIS — M7651 Patellar tendinitis, right knee: Secondary | ICD-10-CM | POA: Diagnosis not present

## 2018-08-17 DIAGNOSIS — Z6841 Body Mass Index (BMI) 40.0 and over, adult: Secondary | ICD-10-CM | POA: Insufficient documentation

## 2018-08-21 ENCOUNTER — Encounter: Payer: Self-pay | Admitting: Anesthesiology

## 2018-08-21 ENCOUNTER — Ambulatory Visit: Payer: Medicare Other | Attending: Anesthesiology | Admitting: Anesthesiology

## 2018-08-21 ENCOUNTER — Other Ambulatory Visit: Payer: Self-pay

## 2018-08-21 DIAGNOSIS — M542 Cervicalgia: Secondary | ICD-10-CM

## 2018-08-21 DIAGNOSIS — M5136 Other intervertebral disc degeneration, lumbar region: Secondary | ICD-10-CM

## 2018-08-21 DIAGNOSIS — M150 Primary generalized (osteo)arthritis: Secondary | ICD-10-CM

## 2018-08-21 DIAGNOSIS — M25551 Pain in right hip: Secondary | ICD-10-CM

## 2018-08-21 DIAGNOSIS — M47816 Spondylosis without myelopathy or radiculopathy, lumbar region: Secondary | ICD-10-CM

## 2018-08-21 DIAGNOSIS — M8949 Other hypertrophic osteoarthropathy, multiple sites: Secondary | ICD-10-CM

## 2018-08-21 DIAGNOSIS — M545 Low back pain, unspecified: Secondary | ICD-10-CM

## 2018-08-21 DIAGNOSIS — F1190 Opioid use, unspecified, uncomplicated: Secondary | ICD-10-CM

## 2018-08-21 DIAGNOSIS — M5432 Sciatica, left side: Secondary | ICD-10-CM | POA: Diagnosis not present

## 2018-08-21 DIAGNOSIS — M25552 Pain in left hip: Secondary | ICD-10-CM

## 2018-08-21 DIAGNOSIS — M159 Polyosteoarthritis, unspecified: Secondary | ICD-10-CM

## 2018-08-21 DIAGNOSIS — G894 Chronic pain syndrome: Secondary | ICD-10-CM

## 2018-08-21 MED ORDER — OXYCODONE HCL 10 MG PO TABS: 10.0000 mg | ORAL_TABLET | Freq: Four times a day (QID) | ORAL | 0 refills | Status: AC

## 2018-08-21 MED ORDER — OXYCODONE HCL 10 MG PO TABS: 10.0000 mg | ORAL_TABLET | Freq: Four times a day (QID) | ORAL | 0 refills | Status: DC

## 2018-08-21 NOTE — Progress Notes (Signed)
Virtual Visit via Telephone Note  I connected with Wendy Johnson on 08/21/18 at 12:45 PM EDT by telephone and verified that I am speaking with the correct person using two identifiers.   I discussed the limitations, risks, security and privacy concerns of performing an evaluation and management service by telephone and the availability of in person appointments. I also discussed with the patient that there may be a patient responsible charge related to this service. The patient expressed understanding and agreed to proceed.   History of Present Illness: Wendy Johnson is doing about the same at this point.  She is having some recurrent radicular pain that she complains of affecting the bilateral lower legs.  This seems to be worse on the left side with left calf spasming.  She had an epidural at her last visit that gave her 75% improvement lasting about 3 to 4 weeks.  She is had recurrence of the same pain.  She tries to do her exercises but continues to have difficulty getting relief.  She takes her oxycodone 4 times a day successfully without any side effects and based on her previous narcotic assessment sheet she continues to derive good relief with the medicine and no side effects.  Otherwise she is in her usual state of health.  She reports no change in bowel or bladder function or strength to the lower extremities.    Observations/Objective:   Assessment and Plan: 1. Primary osteoarthritis involving multiple joints   2. DDD (degenerative disc disease), lumbar   3. Sciatica of left side   4. Low back pain at multiple sites   5. Chronic, continuous use of opioids   6. Facet arthritis of lumbar region   7. Chronic pain syndrome   8. Cervicalgia   9. Pain of both hip joints   I have reviewed the practitioner database information and it is appropriate for refill today.  We will schedule and send in her refills for her oxycodone 10 mg tablets for April 14 and May 14.  I am scheduling her for a return  to clinic in 1 month for an epidural.  We have gone over the risks and benefits of the procedure once again in full detail and all questions have been answered.  This conversation was per telephone and all of her questions have been answered during our conversation today.   Follow Up Instructions:    I discussed the assessment and treatment plan with the patient. The patient was provided an opportunity to ask questions and all were answered. The patient agreed with the plan and demonstrated an understanding of the instructions.   The patient was advised to call back or seek an in-person evaluation if the symptoms worsen or if the condition fails to improve as anticipated.  I provided 20 minutes of non-face-to-face time during this encounter.   Molli Barrows, MD

## 2018-08-28 DIAGNOSIS — G4733 Obstructive sleep apnea (adult) (pediatric): Secondary | ICD-10-CM | POA: Diagnosis not present

## 2018-08-28 DIAGNOSIS — J449 Chronic obstructive pulmonary disease, unspecified: Secondary | ICD-10-CM | POA: Diagnosis not present

## 2018-09-19 ENCOUNTER — Ambulatory Visit: Payer: Self-pay | Admitting: Pharmacist

## 2018-09-19 NOTE — Chronic Care Management (AMB) (Signed)
°  Chronic Care Management   Outreach Note  09/19/2018 Name: Wendy Johnson MRN: [id number] DOB: 02-Feb-1966  Referred by: Steele Sizer, MD Reason for referral : No chief complaint on file.   An unsuccessful telephone outreach was attempted today. The patient was referred to the case management team by for assistance with chronic care management and care coordination.   Follow Up Plan: The CM team will reach out to the patient again over the next 7 days.   [place]  [email or web address]   ??[phone]

## 2018-09-27 ENCOUNTER — Ambulatory Visit: Payer: Medicare Other | Admitting: Family Medicine

## 2018-09-27 ENCOUNTER — Ambulatory Visit: Payer: Self-pay | Admitting: Family Medicine

## 2018-09-27 DIAGNOSIS — G4733 Obstructive sleep apnea (adult) (pediatric): Secondary | ICD-10-CM | POA: Diagnosis not present

## 2018-09-27 DIAGNOSIS — J449 Chronic obstructive pulmonary disease, unspecified: Secondary | ICD-10-CM | POA: Diagnosis not present

## 2018-09-27 NOTE — Chronic Care Management (AMB) (Signed)
°  Chronic Care Management   Outreach Note  09/27/2018 Name: Wendy Johnson MRN: [id number] DOB: [birth year]  Referred by: Steele Sizer, MD Reason for referral : No chief complaint on file.   A second unsuccessful telephone outreach was attempted today. The patient was referred to the case management team for assistance with chronic care management and care coordination.   Follow Up Plan: A HIPPA compliant phone message was left for the patient providing contact information and requesting a return call.  The CM team will reach out to the patient again over the next 7 days.  If patient returns call to provider office, please advise to call [place]* at [phone]

## 2018-09-28 ENCOUNTER — Ambulatory Visit: Payer: Medicare Other | Admitting: Family Medicine

## 2018-10-04 ENCOUNTER — Encounter: Payer: Self-pay | Admitting: Family Medicine

## 2018-10-04 ENCOUNTER — Ambulatory Visit (INDEPENDENT_AMBULATORY_CARE_PROVIDER_SITE_OTHER): Payer: Medicare Other | Admitting: Family Medicine

## 2018-10-04 ENCOUNTER — Other Ambulatory Visit: Payer: Self-pay

## 2018-10-04 VITALS — BP 128/82 | HR 117 | Temp 97.9°F | Resp 16 | Ht 64.0 in | Wt 249.0 lb

## 2018-10-04 DIAGNOSIS — R1902 Left upper quadrant abdominal swelling, mass and lump: Secondary | ICD-10-CM | POA: Diagnosis not present

## 2018-10-04 DIAGNOSIS — Z6841 Body Mass Index (BMI) 40.0 and over, adult: Secondary | ICD-10-CM

## 2018-10-04 DIAGNOSIS — Z8719 Personal history of other diseases of the digestive system: Secondary | ICD-10-CM

## 2018-10-04 DIAGNOSIS — F1021 Alcohol dependence, in remission: Secondary | ICD-10-CM

## 2018-10-04 DIAGNOSIS — E785 Hyperlipidemia, unspecified: Secondary | ICD-10-CM

## 2018-10-04 DIAGNOSIS — E669 Obesity, unspecified: Secondary | ICD-10-CM

## 2018-10-04 DIAGNOSIS — J449 Chronic obstructive pulmonary disease, unspecified: Secondary | ICD-10-CM | POA: Diagnosis not present

## 2018-10-04 DIAGNOSIS — F3131 Bipolar disorder, current episode depressed, mild: Secondary | ICD-10-CM | POA: Diagnosis not present

## 2018-10-04 DIAGNOSIS — E1169 Type 2 diabetes mellitus with other specified complication: Secondary | ICD-10-CM | POA: Diagnosis not present

## 2018-10-04 DIAGNOSIS — M4696 Unspecified inflammatory spondylopathy, lumbar region: Secondary | ICD-10-CM

## 2018-10-04 DIAGNOSIS — Z79899 Other long term (current) drug therapy: Secondary | ICD-10-CM | POA: Diagnosis not present

## 2018-10-04 DIAGNOSIS — Z9889 Other specified postprocedural states: Secondary | ICD-10-CM

## 2018-10-04 MED ORDER — TIOTROPIUM BROMIDE MONOHYDRATE 18 MCG IN CAPS: 18.0000 ug | ORAL_CAPSULE | Freq: Every day | RESPIRATORY_TRACT | 5 refills | Status: DC

## 2018-10-04 MED ORDER — PROAIR HFA 108 (90 BASE) MCG/ACT IN AERS: 2.0000 | INHALATION_SPRAY | RESPIRATORY_TRACT | 0 refills | Status: DC | PRN

## 2018-10-04 NOTE — Progress Notes (Signed)
Name: Wendy Johnson   MRN: [id number]    DOB: 02/01/1966   Date:10/04/2018       Progress Note  Subjective  Chief Complaint  Chief Complaint  Patient presents with  . Hernia    left side  . Medication Refill    HPI  LUQ lump: she noticed that left side of abdomen seems larger than the right and also has a knot that is tender to touch, near scar from umbilical repair, but not underneath it. No change in bowel movements, rashes, nausea , vomiting  or fever, the area is only tender to touch and occasionally has a stabbing pain.   DM: diet controlled with dyslipidemia: she is up to date with eye exam. Denies polyphagia, polydipsia or polyuria.She still has low HDL, LDL elevated and triglycerides up. We will recheck labs today   Dyslipidemia: She has high triglycerides , low HDl and high LDL, she is not  taking statin therapy, and trying life style modification. We will recheck labs today   DDD lumbar spine: she sees pain clinic- Dr Andree Elk, she is on Oxycodone 10 mg QID, denies constipation - controlled with Benifiber, she has narcan at home. Pain is mostly on her back but also on her joints ( hips, knees and hands) and radiates to right outer hip intermittently.   GERD: taking Dexilant and seen by Dr. Samuel Jester longer has heartburn or regurgitation, very seldom has symptoms.Unchanged    Morbid obesity:her maximum weight was around 273lbs, she lost down to 184 lbs with physical activity and life style modification, she gradually gained weight back but is losing it again, 8 months ago weight was 267 lbs and today weight is down to 248 lbs. She states she has been drinking more water, stopped Pepsi again and is eating more fruit   Bipolar disorder: on multiple medications , given by Dr. Kasandra Knudsen. However under more stress today, lost her friend recently, son is in jail but is getting out soon, she is more stressed, feeling down lately.   COPD : she states that she could not tolerate Breo,  she would prefer staying on spiriva, she also uses oxygen at night. Uses albuterol on machine and prn when at home. Denies daily cough, she has occasional wheeze worse at night, she is trying to stop smoking but cannot take Chantix, advised to discuss it with psychiatrist and see if able to tolerate wellbutrin or may need nicotine replacement   Alcohol history: she completely quit Feb 2017 , doing well in remission  Patient Active Problem List   Diagnosis Date Noted  . Unspecified inflammatory spondylopathy, lumbar region ([place]) 10/04/2018  . BMI 40.0-44.9, adult ([place]) 08/17/2018  . Lumbar spondylosis 03/19/2018  . Polyarthralgia 03/19/2018  . History of alcoholism ([place]) 07/19/2017  . Bilateral carpal tunnel syndrome 05/29/2017  . Painful total knee replacement, right ([place]) 10/11/2016  . Rotator cuff tendinitis, right 07/29/2016  . Occipital neuralgia 10/12/2015  . Instability of prosthetic knee ([place]) 09/22/2015  . Primary osteoarthritis of right hip 05/12/2015  . Sleep apnea 04/07/2015  . Primary osteoarthritis of left hip 12/16/2014  . Metabolic syndrome 73/71/0626  . Migraine without aura and without status migrainosus, not intractable 11/26/2014  . GERD without esophagitis 11/26/2014  . COPD, moderate ([place]) 11/26/2014  . Nocturnal oxygen desaturation 11/26/2014  . Supplemental oxygen dependent 11/26/2014  . Hearing loss 11/26/2014  . Pain syndrome, chronic 11/26/2014  . History of hypertension 11/26/2014  . Dyslipidemia 11/26/2014  . Morbid obesity ([place]) 11/26/2014  .  Dyslipidemia associated with type 2 diabetes mellitus ([place]) 11/26/2014  . Chronic constipation 11/26/2014  . Generalized anxiety disorder 11/11/2014  . DDD (degenerative disc disease), lumbar 11/11/2014  . Facet arthritis of lumbar region 11/11/2014  . Primary osteoarthritis involving multiple joints 11/11/2014  . H/O hysterectomy for benign disease 10/20/2014  . Low back derangement syndrome 09/30/2014  .  Bipolar disorder ([place]) 09/30/2014    Past Surgical History:  Procedure Laterality Date  . ABDOMINAL HYSTERECTOMY N/A 10/20/2014   Procedure: Total abdominial hysterectomy, bilateral salpingo-oophorectomy;  Surgeon: Brayton Mars, MD;  Location: [facility] ORS;  Service: Gynecology;  Laterality: N/A;  . BILATERAL SALPINGOOPHORECTOMY    . bone spurs removed Bilateral   . CARPAL TUNNEL RELEASE Left 06/08/2017   Procedure: CARPAL TUNNEL RELEASE;  Surgeon: Hessie Knows, MD;  Location: [facility] ORS;  Service: Orthopedics;  Laterality: Left;  . CARPAL TUNNEL RELEASE Right 10/10/2017   Procedure: CARPAL TUNNEL RELEASE;  Surgeon: Hessie Knows, MD;  Location: [facility] ORS;  Service: Orthopedics;  Laterality: Right;  . COLONOSCOPY WITH PROPOFOL N/A 09/01/2016   Procedure: COLONOSCOPY WITH PROPOFOL;  Surgeon: Jonathon Bellows, MD;  Location[facility];  Service: Endoscopy;  Laterality: N/A;  . DORSAL COMPARTMENT RELEASE Left 06/08/2017   Procedure: RELEASE DORSAL COMPARTMENT (DEQUERVAIN);  Surgeon: Hessie Knows, MD;  Location: [facility] ORS;  Service: Orthopedics;  Laterality: Left;  . ESOPHAGOGASTRODUODENOSCOPY (EGD) WITH PROPOFOL N/A 09/01/2016   Procedure: ESOPHAGOGASTRODUODENOSCOPY (EGD) WITH PROPOFOL;  Surgeon: Jonathon Bellows, MD;  Location: [facility] ENDOSCOPY;  Service: Endoscopy;  Laterality: N/A;  . FOOT SURGERY Bilateral   . INSERTION OF MESH N/A 12/26/2017   Procedure: INSERTION OF MESH;  Surgeon: Jules Husbands, MD;  Location: [facility] ORS;  Service: General;  Laterality: N/A;  . JOINT REPLACEMENT Right    Total Knee replacement X 2  . JOINT REPLACEMENT Bilateral    Total Hip Replacement  . knee arthroscopo Right   . LAPAROSCOPY  09/22/2014   Procedure: LAPAROSCOPY OPERATIVE;  Surgeon: Brayton Mars, MD;  Location: [facility] ORS;  Service: Gynecology;;  excision and fulgeration of endomertriosis  . LIPOMA EXCISION    . ROBOTIC ASSISTED LAPAROSCOPIC VENTRAL/INCISIONAL HERNIA REPAIR N/A 12/26/2017   Procedure: ROBOTIC  ASSISTED LAPAROSCOPIC VENTRAL/INCISIONAL HERNIA REPAIR;  Surgeon: Jules Husbands, MD;  Location: [facility] ORS;  Service: General;  Laterality: N/A;  . TONSILLECTOMY    . TOTAL HIP ARTHROPLASTY Left 12/16/2014   Procedure: TOTAL HIP ARTHROPLASTY ANTERIOR APPROACH;  Surgeon: Hessie Knows, MD;  Location: [facility] ORS;  Service: Orthopedics;  Laterality: Left;  . TOTAL HIP ARTHROPLASTY Right 05/12/2015   Procedure: TOTAL HIP ARTHROPLASTY ANTERIOR APPROACH;  Surgeon: Hessie Knows, MD;  Location: [facility] ORS;  Service: Orthopedics;  Laterality: Right;  . TOTAL KNEE REVISION Right 09/22/2015   Procedure: TOTAL KNEE REVISION/ REVISE POLYIETHYLENE;  Surgeon: Hessie Knows, MD;  Location: [facility] ORS;  Service: Orthopedics;  Laterality: Right;  . TOTAL KNEE REVISION Right 10/11/2016   Procedure: TOTAL KNEE REVISION;  Surgeon: Hessie Knows, MD;  Location: [facility] ORS;  Service: Orthopedics;  Laterality: Right;  . TUBAL LIGATION      Family History  Problem Relation Age of Onset  . Diabetes Mother   . Heart disease Father   . Heart attack Father   . Diabetes Sister   . Breast cancer Maternal Aunt        <50  . Breast cancer Paternal Aunt        x2.  <50  . Healthy Son   . Drug abuse Sister  Social History   Socioeconomic History  . Marital status: Divorced    Spouse name: Not on file  . Number of children: 1  . Years of education: Not on file  . Highest education level: 10th grade  Occupational History  . Occupation: disabled  Social Needs  . Financial resource strain: Not hard at all  . Food insecurity:    Worry: Never true    Inability: Never true  . Transportation needs:    Medical: No    Non-medical: No  Tobacco Use  . Smoking status: Current Every Day Smoker    Packs/day: 0.50    Years: 37.00    Pack years: 18.50    Types: Cigarettes    Start date: 11/26/1979  . Smokeless tobacco: Never Used  Substance and Sexual Activity  . Alcohol use: No    Alcohol/week: 0.0 standard drinks    Comment:  but used to be a heavy drinker, quit after DUI 2021  . Drug use: No    Comment: quit crack cocaine 2004  . Sexual activity: Yes    Partners: Male    Birth control/protection: None, Surgical  Lifestyle  . Physical activity:    Days per week: 3 days    Minutes per session: 60 min  . Stress: Very much  Relationships  . Social connections:    Talks on phone: More than three times a week    Gets together: Twice a week    Attends religious service: Never    Active member of club or organization: Patient refused    Attends meetings of clubs or organizations: Patient refused    Relationship status: Divorced  . Intimate partner violence:    Fear of current or ex partner: No    Emotionally abused: No    Physically abused: No    Forced sexual activity: No  Other Topics Concern  . Not on file  Social History Narrative   Lives with boyfriend   Disable from psychiatric disease     Current Outpatient Medications:  .  albuterol (PROVENTIL) (2.5 MG/3ML) 0.083% nebulizer solution, Take 3 mLs (2.5 mg total) by nebulization every 6 (six) hours as needed for wheezing or shortness of breath., Disp: 75 mL, Rfl: 1 .  amphetamine-dextroamphetamine (ADDERALL) 15 MG tablet, Take 1 tablet by mouth daily., Disp: , Rfl:  .  amphetamine-dextroamphetamine (ADDERALL) 30 MG tablet, Take 30 mg by mouth 2 (two) times daily., Disp: , Rfl:  .  busPIRone (BUSPAR) 30 MG tablet, Take 30 mg by mouth 2 (two) times daily. , Disp: , Rfl:  .  clonazePAM (KLONOPIN) 1 MG tablet, Take 2 mg by mouth 3 (three) times daily., Disp: , Rfl:  .  cyclobenzaprine (FLEXERIL) 5 MG tablet, Take 1 tablet (5 mg total) by mouth 3 (three) times daily as needed for muscle spasms., Disp: 30 tablet, Rfl: 0 .  DEXILANT 60 MG capsule, TAKE 1 CAPSULE BY MOUTH DAILY, Disp: 30 capsule, Rfl: 5 .  hydrOXYzine (ATARAX/VISTARIL) 25 MG tablet, Take 25 mg by mouth every 6 (six) hours as needed for anxiety or itching (hives)., Disp: , Rfl:  .  ibuprofen  (ADVIL,MOTRIN) 800 MG tablet, Take 1 tablet (800 mg total) by mouth every 8 (eight) hours as needed., Disp: 30 tablet, Rfl: 0 .  LATUDA 40 MG TABS tablet, , Disp: , Rfl:  .  naloxone (NARCAN) nasal spray 4 mg/0.1 mL, For respiratory suppression from opioids, Disp: 1 kit, Rfl: 2 .  Oxycodone HCl 10 MG TABS,  Take 1 tablet (10 mg total) by mouth 4 (four) times daily for 30 days., Disp: 120 tablet, Rfl: 0 .  PROAIR HFA 108 (90 Base) MCG/ACT inhaler, Inhale 2 puffs into the lungs every 4 (four) hours as needed for wheezing or shortness of breath., Disp: 8.5 g, Rfl: 0 .  sertraline (ZOLOFT) 100 MG tablet, Take 100 mg by mouth 2 (two) times daily., Disp: , Rfl:  .  tiotropium (SPIRIVA HANDIHALER) 18 MCG inhalation capsule, Place 1 capsule (18 mcg total) into inhaler and inhale daily., Disp: 30 capsule, Rfl: 5 .  traZODone (DESYREL) 100 MG tablet, Take 300 mg by mouth at bedtime., Disp: , Rfl:   Allergies  Allergen Reactions  . Codeine Nausea Only  . Imitrex [Sumatriptan] Other (See Comments)    Chest pain    I personally reviewed active problem list, medication list, allergies, family history, social history with the patient/caregiver today.   ROS  Constitutional: Negative for fever, positive for weight change.  Respiratory: Positive  for cough but no shortness of breath.   Cardiovascular: Negative for chest pain or palpitations.  Gastrointestinal: Negative for abdominal pain, no bowel changes.  Musculoskeletal: Negative for gait problem or joint swelling.  Skin: Negative for rash.  Neurological: Negative for dizziness or headache.  No other specific complaints in a complete review of systems (except as listed in HPI above).   Objective  Vitals:   10/04/18 1050  BP: 128/82  Pulse: (!) 117  Resp: 16  Temp: 97.9 F (36.6 C)  TempSrc: Oral  SpO2: 96%  Weight: 249 lb (112.9 kg)  Height: 5' 4"  (1.626 m)    Body mass index is 42.74 kg/m.  Physical Exam  Constitutional: Patient  appears well-developed and well-nourished. Obese  No distress.  HEENT: head atraumatic, normocephalic, pupils equal and reactive to light,  neck supple, throat within normal limits Cardiovascular: Normal rate, regular rhythm and normal heart sounds.  No murmur heard. No BLE edema. Pulmonary/Chest: Effort normal and breath sounds normal. No respiratory distress. Abdominal: Soft.  There is  Tenderness and a lump on left upper quadrant about 2 inches below left rib cage about 3 inches above scar from port entry. Psychiatric: Patient has a normal mood and affect. behavior is normal. Judgment and thought content normal.  PHQ2/9: Depression screen [facility] 2/9 10/04/2018 07/16/2018 07/10/2018 06/27/2018 02/08/2018  Decreased Interest 0 0 0 0 0  Down, Depressed, Hopeless 1 1 1  0 0  PHQ - 2 Score 1 1 1  0 0  Altered sleeping 0 0 - - 0  Tired, decreased energy 3 1 - - 0  Change in appetite 3 0 - - 0  Feeling bad or failure about yourself  0 0 - - 0  Trouble concentrating 0 0 - - 0  Moving slowly or fidgety/restless 0 0 - - 0  Suicidal thoughts 0 0 - - 0  PHQ-9 Score 7 2 - - 0  Difficult doing work/chores Somewhat difficult Not difficult at all - - Not difficult at all  Some recent data might be hidden    phq 9 is positive   Fall Risk: Fall Risk  10/04/2018 07/16/2018 07/10/2018 06/27/2018 02/08/2018  Falls in the past year? 0 0 0 0 No  Number falls in past yr: 0 0 0 - -  Injury with Fall? 0 0 0 - -  Comment - - - - -  Risk for fall due to : - - - - -  Follow up - -  Falls prevention discussed - -  Comment - - - - -    Functional Status Survey: Is the patient deaf or have difficulty hearing?: No Does the patient have difficulty seeing, even when wearing glasses/contacts?: No Does the patient have difficulty concentrating, remembering, or making decisions?: No Does the patient have difficulty walking or climbing stairs?: No Does the patient have difficulty dressing or bathing?: No Does the patient have  difficulty doing errands alone such as visiting a doctor's office or shopping?: No   Assessment & Plan  1. Abdominal mass, left upper quadrant  - Ambulatory referral to General Surgery Seems to be either scar tissue maybe a lipoma, losing weight, discussed [place] or referral to you and she chose to see Dr. Dahlia Byes for follow up   2. COPD, moderate ([place])  - tiotropium (SPIRIVA HANDIHALER) 18 MCG inhalation capsule; Place 1 capsule (18 mcg total) into inhaler and inhale daily.  Dispense: 30 capsule; Refill: 5 - PROAIR HFA 108 (90 Base) MCG/ACT inhaler; Inhale 2 puffs into the lungs every 4 (four) hours as needed for wheezing or shortness of breath.  Dispense: 8.5 g; Refill: 0  3. Diabetes mellitus type 2 in obese (HCC)  - COMPLETE METABOLIC PANEL WITH GFR - Hemoglobin A1c  4. Bipolar affective disorder, currently depressed, mild ([place])  Keep follow up with Dr. Su  5. History of alcoholism ([place])   6. Morbid obesity with BMI of 40.0-44.9, adult [facility])  Discussed with the patient the risk posed by an increased BMI. Discussed importance of portion control, calorie counting and at least 150 minutes of physical activity weekly. Avoid sweet beverages and drink more water. Eat at least 6 servings of fruit and vegetables daily   7. History of umbilical hernia repair   8. Dyslipidemia  - Lipid panel  9. Long-term use of high-risk medication  - CBC with Differential/Platelet  10. Unspecified inflammatory spondylopathy, lumbar region [facility])  Under the care of Dr. Andree Elk

## 2018-10-05 LAB — CBC WITH DIFFERENTIAL/PLATELET
Absolute Monocytes: 750 cells/uL (ref 200–950)
Basophils Absolute: 70 cells/uL (ref 0–200)
Basophils Relative: 0.7 %
Eosinophils Absolute: 170 cells/uL (ref 15–500)
Eosinophils Relative: 1.7 %
HCT: 47 % — ABNORMAL HIGH (ref 35.0–45.0)
Hemoglobin: 15.2 g/dL (ref 11.7–15.5)
Lymphs Abs: 1780 cells/uL (ref 850–3900)
MCH: 28.6 pg (ref 27.0–33.0)
MCHC: 32.3 g/dL (ref 32.0–36.0)
MCV: 88.3 fL (ref 80.0–100.0)
MPV: 11.7 fL (ref 7.5–12.5)
Monocytes Relative: 7.5 %
Neutro Abs: 7230 cells/uL (ref 1500–7800)
Neutrophils Relative %: 72.3 %
Platelets: 265 10*3/uL (ref 140–400)
RBC: 5.32 10*6/uL — ABNORMAL HIGH (ref 3.80–5.10)
RDW: 14 % (ref 11.0–15.0)
Total Lymphocyte: 17.8 %
WBC: 10 10*3/uL (ref 3.8–10.8)

## 2018-10-05 LAB — LIPID PANEL
Cholesterol: 201 mg/dL — ABNORMAL HIGH (ref <200)
HDL: 36 mg/dL — ABNORMAL LOW (ref >=50)
Non-HDL Cholesterol (Calc): 165 mg/dL (calc) — ABNORMAL HIGH (ref <130)
Total CHOL/HDL Ratio: 5.6 (calc) — ABNORMAL HIGH (ref <5.0)
Triglycerides: 558 mg/dL — ABNORMAL HIGH (ref <150)

## 2018-10-05 LAB — COMPLETE METABOLIC PANEL WITH GFR
AG Ratio: 1.5 (calc) (ref 1.0–2.5)
ALT: 12 U/L (ref 6–29)
AST: 13 U/L (ref 10–35)
Albumin: 4.1 g/dL (ref 3.6–5.1)
Alkaline phosphatase (APISO): 82 U/L (ref 37–153)
BUN: 9 mg/dL (ref 7–25)
CO2: 24 mmol/L (ref 20–32)
Calcium: 9.6 mg/dL (ref 8.6–10.4)
Chloride: 106 mmol/L (ref 98–110)
Creat: 0.71 mg/dL (ref 0.50–1.05)
GFR, Est African American: 114 mL/min/{1.73_m2} (ref >=60)
GFR, Est Non African American: 99 mL/min/{1.73_m2} (ref >=60)
Globulin: 2.7 g/dL (calc) (ref 1.9–3.7)
Glucose, Bld: 95 mg/dL (ref 65–99)
Potassium: 3.9 mmol/L (ref 3.5–5.3)
Sodium: 141 mmol/L (ref 135–146)
Total Bilirubin: 0.3 mg/dL (ref 0.2–1.2)
Total Protein: 6.8 g/dL (ref 6.1–8.1)

## 2018-10-05 LAB — HEMOGLOBIN A1C
Hgb A1c MFr Bld: 5.3 % of total Hgb (ref <5.7)
Mean Plasma Glucose: 105 (calc)
eAG (mmol/L): 5.8 (calc)

## 2018-10-09 ENCOUNTER — Other Ambulatory Visit: Payer: Self-pay | Admitting: Family Medicine

## 2018-10-09 ENCOUNTER — Ambulatory Visit: Payer: Self-pay | Admitting: Family Medicine

## 2018-10-09 MED ORDER — ATORVASTATIN CALCIUM 40 MG PO TABS: 40.0000 mg | ORAL_TABLET | Freq: Every day | ORAL | 0 refills | Status: DC

## 2018-10-09 MED ORDER — OMEGA-3-ACID ETHYL ESTERS 1 G PO CAPS: 2.0000 g | ORAL_CAPSULE | Freq: Two times a day (BID) | ORAL | 0 refills | Status: DC

## 2018-10-09 NOTE — Chronic Care Management (AMB) (Signed)
Chronic Care Management   Note  10/09/2018 Name: Wendy Johnson MRN: [id number] DOB: 27-Apr-1966  [name] Warning is a 53 y.o. year old female who is a primary care patient of Steele Sizer, MD. I reached out to Theo Dills by phone today in response to a referral sent by [name] health plan.    [name] was given information about Chronic Care Management services today including:  1. CCM service includes personalized support from designated clinical staff supervised by her physician, including individualized plan of care and coordination with other care providers 2. 24/7 contact phone numbers for assistance for urgent and routine care needs. 3. Service will only be billed when office clinical staff spend 20 minutes or more in a month to coordinate care. 4. Only one practitioner may furnish and bill the service in a calendar month. 5. The patient may stop CCM services at any time (effective at the end of the month) by phone call to the office staff. 6. The patient will be responsible for cost sharing (co-pay) of up to 20% of the service fee (after annual deductible is met).  Patient agreed to services and verbal consent obtained.   Follow up plan: Telephone appointment with CCM team member scheduled for: 10/12/2018  [place]  [email or web address]   ??[phone]

## 2018-10-10 ENCOUNTER — Other Ambulatory Visit: Payer: Self-pay | Admitting: Family Medicine

## 2018-10-10 MED ORDER — IBUPROFEN 800 MG PO TABS: 800.0000 mg | ORAL_TABLET | Freq: Three times a day (TID) | ORAL | 0 refills | Status: DC | PRN

## 2018-10-12 ENCOUNTER — Ambulatory Visit: Payer: Medicare Other

## 2018-10-12 ENCOUNTER — Ambulatory Visit: Payer: Self-pay | Admitting: *Deleted

## 2018-10-12 ENCOUNTER — Other Ambulatory Visit: Payer: Self-pay

## 2018-10-12 DIAGNOSIS — E785 Hyperlipidemia, unspecified: Secondary | ICD-10-CM

## 2018-10-12 NOTE — Patient Instructions (Addendum)
Thank you allowing the Chronic Care Management Team to be a part of your care! It was a pleasure speaking with you today!  1. Please pick up your atorvastatin and Omega 3 prescriptions today. 2. It is best if you take your atorvastatin in the evenings. 3. Please limits your intake of fried foods, saturated fats, and processed sweets. 4. Try to remain as active as you can. Exercise is a great way to lower your cholesterol and raise your good cholesterol. 5. Please read the educational information I have provided you in this packet. Call me with any questions or concerns. 6. Here is a list of food banks in your area. I have spoken with our Education officer, museum and she says you will not loose your life insurance if you apply for food stamps.  [employer] [phone]  [place]  [place] [phone]  .  CCM (Chronic Care Management) Team   Trish Fountain RN, BSN Nurse Care Coordinator  [phone]  Ruben Reason PharmD  Clinical Pharmacist  [phone]   Elliot Gurney, LCSW Clinical Social Worker [phone]  Goals Addressed            This Visit's Progress   . Dr. Ancil Boozer says my cholesterol is getting worse (pt-stated)       Current Barriers:  Marland Kitchen Knowledge Deficits related to understanding dyslipidemia  . Inability to purchase "healthy foods" secondary to financial strain  Nurse Case Manager Clinical Goal(s):  Marland Kitchen Over the next 14 days, patient will verbalize understanding of plan for managing her dyslipidemia through medication adherence, lipid lowering diet, and exercise  Interventions:  . Evaluation of current treatment plan related to dyslipidemia and patient's adherence to plan as established by provider. . Advised patient to pick up medications from pharmacy today and take as directed . Provided education to patient re: lifestyle modifications to lower cholesterol and triglycerides . Reviewed medications with patient  and discussed importance of compliance . Discussed plans with patient for ongoing care management follow up and provided patient with direct contact information for care management team . Provided patient and/or caregiver with contact information about local food banks (community resource). . Provided patient with written educational materials related to dyslipidemia management  . Reconciled medications . Collaboration with [facility] to provide patient with food stamps application  Patient Self Care Activities:  . Self administers medications as prescribed . Attends all scheduled provider appointments . Calls pharmacy for medication refills . Calls provider office for new concerns or questions  . Adhere to lipid lowering diet . Exercise as tolerated  Initial goal documentation        Print copy of patient instructions provided.   Telephone follow up appointment with care management team member scheduled for: 2 weeks  SYMPTOMS OF A STROKE   You have any symptoms of stroke. "BE FAST" is an easy way to remember the main warning signs: ? B - Balance. Signs are dizziness, sudden trouble walking, or loss of balance. ? E - Eyes. Signs are trouble seeing or a sudden change in how you see. ? F - Face. Signs are sudden weakness or loss of feeling of the face, or the face or eyelid drooping on one side. ? A - Arms. Signs are weakness or loss of feeling in an arm. This happens suddenly and usually on one side of the body. ? S - Speech. Signs are sudden trouble speaking, slurred speech, or trouble understanding what people say. ?  T - Time. Time to call emergency services. Write down what time symptoms started.  You have other signs of stroke, such as: ? A sudden, very bad headache with no known cause. ? Feeling sick to your stomach (nausea). ? Throwing up (vomiting). ? Jerky movements you cannot control (seizure).  SYMPTOMS OF A HEART ATTACK  What are the signs or  symptoms? Symptoms of this condition include:  Chest pain. It may feel like: ? Crushing or squeezing. ? Tightness, pressure, fullness, or heaviness.  Pain in the arm, neck, jaw, back, or upper body.  Shortness of breath.  Heartburn.  Indigestion.  Nausea.  Cold sweats.  Feeling tired.  Sudden lightheadedness.  Dyslipidemia (High Cholesterol) Dyslipidemia is an imbalance of waxy, fat-like substances (lipids) in the blood. The body needs lipids in small amounts. Dyslipidemia often involves a high level of cholesterol or triglycerides, which are types of lipids. Common forms of dyslipidemia include:  High levels of LDL cholesterol. LDL is the type of cholesterol that causes fatty deposits (plaques) to build up in the blood vessels that carry blood away from your heart (arteries).  Low levels of HDL cholesterol. HDL cholesterol is the type of cholesterol that protects against heart disease. High levels of HDL remove the LDL buildup from arteries.  High levels of triglycerides. Triglycerides are a fatty substance in the blood that is linked to a buildup of plaques in the arteries. What are the causes? Primary dyslipidemia is caused by changes (mutations) in genes that are passed down through families (inherited). These mutations cause several types of dyslipidemia. Secondary dyslipidemia is caused by lifestyle choices and diseases that lead to dyslipidemia, such as:  Eating a diet that is high in animal fat.  Not getting enough exercise.  Having diabetes, kidney disease, liver disease, or thyroid disease.  Drinking large amounts of alcohol.  Using certain medicines. What increases the risk? You are more likely to develop this condition if you are an older man or if you are a woman who has gone through menopause. Other risk factors include:  Having a family history of dyslipidemia.  Taking certain medicines, including birth control pills, steroids, some diuretics, and  beta-blockers.  Smoking cigarettes.  Eating a high-fat diet.  Having certain medical conditions such as diabetes, polycystic ovary syndrome (PCOS), kidney disease, liver disease, or hypothyroidism.  Not exercising regularly.  Being overweight or obese with too much belly fat. What are the signs or symptoms? In most cases, dyslipidemia does not usually cause any symptoms. In severe cases, very high lipid levels can cause:  Fatty bumps under the skin (xanthomas).  White or gray ring around the black center (pupil) of the eye. Very high triglyceride levels can cause inflammation of the pancreas (pancreatitis). How is this diagnosed? Your health care provider may diagnose dyslipidemia based on a routine blood test (fasting blood test). Because most people do not have symptoms of the condition, this blood testing (lipid profile) is done on adults age 70 and older and is repeated every 5 years. This test checks:  Total cholesterol. This measures the total amount of cholesterol in your blood, including LDL cholesterol, HDL cholesterol, and triglycerides. A healthy number is below 200. Yours was 201  LDL cholesterol. The target number for LDL cholesterol is different for each person, depending on individual risk factors. Ask your health care provider what your LDL cholesterol should be.  HDL cholesterol. An HDL level of 60 or higher is best because it helps to protect against  heart disease. A number below 2 for men or below 52 for women increases the risk for heart disease. Yours was 36  Triglycerides. A healthy triglyceride number is below 150. Yours was 578 If your lipid profile is abnormal, your health care provider may do other blood tests. How is this treated? Treatment depends on the type of dyslipidemia that you have and your other risk factors for heart disease and stroke. Your health care provider will have a target range for your lipid levels based on this information. For many  people, this condition may be treated by lifestyle changes, such as diet and exercise. Your health care provider may recommend that you:  Get regular exercise.  Make changes to your diet.  Quit smoking if you smoke. If diet changes and exercise do not help you reach your goals, your health care provider may also prescribe medicine to lower lipids. The most commonly prescribed type of medicine lowers your LDL cholesterol (statin drug). If you have a high triglyceride level, your provider may prescribe another type of drug (fibrate) or an omega-3 fish oil supplement, or both. Follow these instructions at home:  Eating and drinking  Follow instructions from your health care provider or dietitian about eating or drinking restrictions.  Eat a healthy diet as told by your health care provider. This can help you reach and maintain a healthy weight, lower your LDL cholesterol, and raise your HDL cholesterol. This may include: ? Limiting your calories, if you are overweight. ? Eating more fruits, vegetables, whole grains, fish, and lean meats. ? Limiting saturated fat, trans fat, and cholesterol.  If you drink alcohol: ? Limit how much you use. ? Be aware of how much alcohol is in your drink. In the [place] one drink equals one 12 oz bottle of beer (355 mL), one 5 oz glass of wine (148 mL), or one 1 oz glass of hard liquor (44 mL).  Do not drink alcohol if: ? Your health care provider tells you not to drink. ? You are pregnant, may be pregnant, or are planning to become pregnant. Activity  Get regular exercise. Start an exercise and strength training program as told by your health care provider. Ask your health care provider what activities are safe for you. Your health care provider may recommend: ? 30 minutes of aerobic activity 4-6 days a week. Brisk walking is an example of aerobic activity. ? Strength training 2 days a week. General instructions  Do not use any products that contain  nicotine or tobacco, such as cigarettes, e-cigarettes, and chewing tobacco. If you need help quitting, ask your health care provider.  Take over-the-counter and prescription medicines only as told by your health care provider. This includes supplements.  Keep all follow-up visits as told by your health care provider. Contact a health care provider if:  You are: ? Having trouble sticking to your exercise or diet plan. ? Struggling to quit smoking or control your use of alcohol. Summary  Dyslipidemia often involves a high level of cholesterol or triglycerides, which are types of lipids.  Treatment depends on the type of dyslipidemia that you have and your other risk factors for heart disease and stroke.  For many people, treatment starts with lifestyle changes, such as diet and exercise.  Your health care provider may prescribe medicine to lower lipids. This information is not intended to replace advice given to you by your health care provider. Make sure you discuss any questions you have with your health  care provider. Document Released: 04/30/2013 Document Revised: 12/18/2017 Document Reviewed: 11/24/2017 Elsevier Interactive Patient Education  2019 [place] and Cholesterol Restricted Eating Plan Getting too much fat and cholesterol in your diet may cause health problems. Choosing the right foods helps keep your fat and cholesterol at normal levels. This can keep you from getting certain diseases. Your doctor may recommend an eating plan that includes: What are tips for following this plan? Meal planning At meals, divide your plate into four equal parts: Fill one-half of your plate with vegetables and green salads. Fill one-fourth of your plate with whole grains. Fill one-fourth of your plate with low-fat (lean) protein foods. Eat fish that is high in omega-3 fats at least two times a week. This includes mackerel, tuna, sardines, and salmon. Eat foods that are high in  fiber, such as whole grains, beans, apples, broccoli, carrots, peas, and barley. General tips  Work with your doctor to lose weight if you need to. Avoid: Foods with added sugar. Fried foods. Foods with partially hydrogenated oils. Limit alcohol intake to no more than 1 drink a day for nonpregnant women and 2 drinks a day for men. One drink equals 12 oz of beer, 5 oz of wine, or 1 oz of hard liquor. Reading food labels Check food labels for: Trans fats. Partially hydrogenated oils. Saturated fat (g) in each serving. Cholesterol (mg) in each serving. Fiber (g) in each serving. Choose foods with healthy fats, such as: Monounsaturated fats. Polyunsaturated fats. Omega-3 fats. Choose grain products that have whole grains. Look for the word "whole" as the first word in the ingredient list. Cooking Cook foods using low-fat methods. These include baking, boiling, grilling, and broiling. Eat more home-cooked foods. Eat at restaurants and buffets less often. Avoid cooking using saturated fats, such as butter, cream, palm oil, palm kernel oil, and coconut oil. Recommended foods Fruits All fresh, canned (in natural juice), or frozen fruits. Vegetables Fresh or frozen vegetables (raw, steamed, roasted, or grilled). Green salads. Grains Whole grains, such as whole wheat or whole grain breads, crackers, cereals, and pasta. Unsweetened oatmeal, bulgur, barley, quinoa, or brown rice. Corn or whole wheat flour tortillas. Meats and other protein foods Ground beef (85% or leaner), grass-fed beef, or beef trimmed of fat. Skinless chicken or [place]. Ground chicken or [place]. Pork trimmed of fat. All fish and seafood. Egg whites. Dried beans, peas, or lentils. Unsalted nuts or seeds. Unsalted canned beans. Nut butters without added sugar or oil. Dairy Low-fat or nonfat dairy products, such as skim or 1% milk, 2% or reduced-fat cheeses, low-fat and fat-free ricotta or cottage cheese, or plain low-fat  and nonfat yogurt. Fats and oils Tub margarine without trans fats. Light or reduced-fat mayonnaise and salad dressings. Avocado. Olive, canola, sesame, or safflower oils. The items listed above may not be a complete list of foods and beverages you can eat. Contact a dietitian for more information. Foods to avoid Fruits Canned fruit in heavy syrup. Fruit in cream or butter sauce. Fried fruit. Vegetables Vegetables cooked in cheese, cream, or butter sauce. Fried vegetables. Grains White bread. White pasta. White rice. Cornbread. Bagels, pastries, and croissants. Crackers and snack foods that contain trans fat and hydrogenated oils. Meats and other protein foods Fatty cuts of meat. Ribs, chicken wings, bacon, sausage, bologna, salami, chitterlings, fatback, hot dogs, bratwurst, and packaged lunch meats. Liver and organ meats. Whole eggs and egg yolks. Chicken and [place] with skin. Fried meat. Dairy Whole or 2%  milk, cream, half-and-half, and cream cheese. Whole milk cheeses. Whole-fat or sweetened yogurt. Full-fat cheeses. Nondairy creamers and whipped toppings. Processed cheese, cheese spreads, and cheese curds. Beverages Alcohol. Sugar-sweetened drinks such as sodas, lemonade, and fruit drinks. Fats and oils Butter, stick margarine, lard, shortening, ghee, or bacon fat. Coconut, palm kernel, and palm oils. Sweets and desserts Corn syrup, sugars, honey, and molasses. Candy. Jam and jelly. Syrup. Sweetened cereals. Cookies, pies, cakes, donuts, muffins, and ice cream. The items listed above may not be a complete list of foods and beverages you should avoid. Contact a dietitian for more information. Summary Choosing the right foods helps keep your fat and cholesterol at normal levels. This can keep you from getting certain diseases. At meals, fill one-half of your plate with vegetables and green salads. Eat high-fiber foods, like whole grains, beans, apples, carrots, peas, and barley. Limit  added sugar, saturated fats, alcohol, and fried foods. This information is not intended to replace advice given to you by your health care provider. Make sure you discuss any questions you have with your health care provider. Document Released: 10/25/2011 Document Revised: 12/27/2017 Document Reviewed: 01/10/2017 Elsevier Interactive Patient Education  2019 [employer].

## 2018-10-12 NOTE — Chronic Care Management (AMB) (Signed)
  Chronic Care Management   Social Work Note  10/12/2018 Name: AYLANA HIRSCHFELD MRN: [id number] DOB: 1966-01-07  Dove Gresham Dolph is a 53 y.o. year old female who sees Steele Sizer, MD for primary care. The CCM team was consulted for assistance with mailing out a food stamp application for patient to complete.   Goals Addressed   None     Follow Up Plan: Client will complete application and mail to her local Department of [place], [facility] Care Management [phone]

## 2018-10-12 NOTE — Chronic Care Management (AMB) (Signed)
Chronic Care Management   Initial Visit Note  10/12/2018 Name: Wendy Johnson MRN: [id number] DOB: Apr 10, 1966  Subjective: "Dr. Ancil Boozer says my cholesterol is getting worse by I don't know what that means"  Objective:  Lab Results  Component Value Date   CHOL 201 (H) 10/04/2018   HDL 36 (L) 10/04/2018   [place]  10/04/2018     Comment:     . LDL cholesterol not calculated. Triglyceride levels greater than 400 mg/dL invalidate calculated LDL results. . Reference range: <100 . Desirable range <100 mg/dL for primary prevention;   <70 mg/dL for patients with CHD or diabetic patients  with > or = 2 CHD risk factors. Marland Kitchen LDL-C is now calculated using the Martin-Hopkins  calculation, which is a validated novel method providing  better accuracy than the Friedewald equation in the  estimation of LDL-C.  Cresenciano Genre et al. Annamaria Helling. 3704;888(91): 2061-2068  (http://education.[facility].com/faq/FAQ164)    TRIG 558 (H) 10/04/2018   CHOLHDL 5.6 (H) 10/04/2018   Lab Results  Component Value Date   HGBA1C 5.3 10/04/2018   HGBA1C 5.5 01/22/2018   HGBA1C 5.9 (H) 07/19/2017   Lab Results  Component Value Date   MICROALBUR 0.3 01/22/2018   [place]  10/04/2018     Comment:     . LDL cholesterol not calculated. Triglyceride levels greater than 400 mg/dL invalidate calculated LDL results. . Reference range: <100 . Desirable range <100 mg/dL for primary prevention;   <70 mg/dL for patients with CHD or diabetic patients  with > or = 2 CHD risk factors. Marland Kitchen LDL-C is now calculated using the Martin-Hopkins  calculation, which is a validated novel method providing  better accuracy than the Friedewald equation in the  estimation of LDL-C.  Cresenciano Genre et al. Annamaria Helling. 6945;038(88): 2061-2068  (http://education.[facility].com/faq/FAQ164)    CREATININE 0.71 10/04/2018   BP Readings from Last 3 Encounters:  10/04/18 128/82  07/16/18 138/90  07/10/18 108/72   Assessment: Wendy Johnson is a 53 year old female who is a primary care patient of Steele Sizer, MD. [name] by her health plan. She has a history of but not limited to Moderate COPD, Dyslipidemia, Metabolic Syndrome, bipolar disorder, and osteoarthritis involving multiple joints.Today CCM RN CM completed a telephone assessment of patients health and discussed health goals.  Review of patient status, including review of consultants reports, relevant laboratory and other test results, and collaboration with appropriate care team members and the patient's provider was performed as part of comprehensive patient evaluation and provision of chronic care management services.     Advanced Directives 10/12/2018  Does Patient Have a Medical Advance Directive? Yes  Type of Advance Directive [place]  Does patient want to make changes to medical advance directive? No - Patient declined  Copy of [place] in Chart? No - copy requested  Would patient like information on creating a medical advance directive? -     Goals Addressed            This Visit's Progress   . Dr. Ancil Boozer says my cholesterol is getting worse (pt-stated)       Ms. Spiller has chronic conditions that decrease her quality of life. She suffers from such severe anxiety that she develops hives. He has osteoarthritis that causes her significant chronic pain. She has recently been told by PCP that "my cholesterol is getting worse and I need different medicine". She understands her diet may also  need to be changed. Today she wishes to focus on her cholesterol and triglycerides to learn lifestyle modifications that will assist her with improving her numbers.    Current Barriers:  Marland Kitchen Knowledge Deficits related to understanding dyslipidemia  . Inability to purchase "healthy foods" secondary to financial strain  Nurse Case Manager Clinical Goal(s):  Marland Kitchen Over the next 14 days, patient will  verbalize understanding of plan for managing her dyslipidemia through medication adherence, lipid lowering diet, and exercise  Interventions:  . Evaluation of current treatment plan related to dyslipidemia and patient's adherence to plan as established by provider. . Advised patient to pick up medications from pharmacy today and take as directed . Provided education to patient re: lifestyle modifications to lower cholesterol and triglycerides . Reviewed medications with patient and discussed importance of compliance . Discussed plans with patient for ongoing care management follow up and provided patient with direct contact information for care management team . Provided patient and/or caregiver with contact information about local food banks (community resource). . Provided patient with written educational materials related to dyslipidemia management  . Reconciled medications . Collaboration with [facility] to provide patient with food stamps application  Patient Self Care Activities:  . Self administers medications as prescribed . Attends all scheduled provider appointments . Calls pharmacy for medication refills . Calls provider office for new concerns or questions  . Adhere to lipid lowering diet . Exercise as tolerated  Initial goal documentation         Follow up plan:  Telephone follow up appointment with care management team member scheduled for: 2 weeks    Olukemi Panchal E. Rollene Rotunda, RN, BSN Nurse Care Coordinator [facility] Care Management  [phone]

## 2018-10-15 ENCOUNTER — Ambulatory Visit (INDEPENDENT_AMBULATORY_CARE_PROVIDER_SITE_OTHER): Payer: Medicare Other | Admitting: Surgery

## 2018-10-15 ENCOUNTER — Encounter: Payer: Self-pay | Admitting: Surgery

## 2018-10-15 ENCOUNTER — Other Ambulatory Visit: Payer: Self-pay

## 2018-10-15 DIAGNOSIS — R1900 Intra-abdominal and pelvic swelling, mass and lump, unspecified site: Secondary | ICD-10-CM | POA: Diagnosis not present

## 2018-10-15 NOTE — Patient Instructions (Addendum)
We will schedule you for a CT abdomen pelvis with contrast.  You will have lab work done also and then follow up here after that.   The patient is scheduled for a CT of the abdomen and pelvis with contrast at [facility] on 10/25/18 at 9:00 am. She will arrive there by 8:45 am and have nothing to eat or drink for 4 hours prior.  She will need to pick up a prep kit prior to the study.  She will follow up with Dr Dahlia Byes on 10/29/18 at 2:30 pm.

## 2018-10-15 NOTE — Progress Notes (Signed)
Outpatient Surgical Follow Up  10/15/2018  Wendy Johnson is an 53 y.o. female.   Chief Complaint  Patient presents with  . Follow-up    abdominal mass    HPI: s/p rob ventral hernia 12/2017 did very well postoperatively over the last couple weeks she felt well and now on the left upper quadrant.  She reports some intermittent pain.  No fevers no chills.  No nausea no vomiting.  No imaging studies are available at this time  Past Medical History:  Diagnosis Date  . ADHD (attention deficit hyperactivity disorder)   . Anxiety   . AR (allergic rhinitis)   . Arthritis   . Benign hypertension    NO MEDS  . Bipolar disorder ([place])   . Chronic back pain   . Chronic constipation   . Chronic insomnia   . COPD (chronic obstructive pulmonary disease) ([place])   . Deaf    RIGHT EAR  . Decreased dorsalis pedis pulse   . Depression   . Diabetes mellitus without complication ([place])    history of DM but no longer has  . Dyslipidemia   . Fatty liver   . GERD (gastroesophageal reflux disease)   . Hepatomegaly   . High cholesterol   . Hot flashes   . Migraine with aura   . OCD (obsessive compulsive disorder)   . Plantar warts   . Severe obesity ([place])   . Shortness of breath dyspnea   . Sleep apnea    NO CPAP  . Tobacco use   . Trochanteric bursitis of right hip     Past Surgical History:  Procedure Laterality Date  . ABDOMINAL HYSTERECTOMY N/A 10/20/2014   Procedure: Total abdominial hysterectomy, bilateral salpingo-oophorectomy;  Surgeon: Brayton Mars, MD;  Location: [facility] ORS;  Service: Gynecology;  Laterality: N/A;  . BILATERAL SALPINGOOPHORECTOMY    . bone spurs removed Bilateral   . CARPAL TUNNEL RELEASE Left 06/08/2017   Procedure: CARPAL TUNNEL RELEASE;  Surgeon: Hessie Knows, MD;  Location: [facility] ORS;  Service: Orthopedics;  Laterality: Left;  . CARPAL TUNNEL RELEASE Right 10/10/2017   Procedure: CARPAL TUNNEL RELEASE;  Surgeon: Hessie Knows, MD;  Location: [facility] ORS;   Service: Orthopedics;  Laterality: Right;  . COLONOSCOPY WITH PROPOFOL N/A 09/01/2016   Procedure: COLONOSCOPY WITH PROPOFOL;  Surgeon: Jonathon Bellows, MD;  Location[facility];  Service: Endoscopy;  Laterality: N/A;  . DORSAL COMPARTMENT RELEASE Left 06/08/2017   Procedure: RELEASE DORSAL COMPARTMENT (DEQUERVAIN);  Surgeon: Hessie Knows, MD;  Location: [facility] ORS;  Service: Orthopedics;  Laterality: Left;  . ESOPHAGOGASTRODUODENOSCOPY (EGD) WITH PROPOFOL N/A 09/01/2016   Procedure: ESOPHAGOGASTRODUODENOSCOPY (EGD) WITH PROPOFOL;  Surgeon: Jonathon Bellows, MD;  Location: [facility] ENDOSCOPY;  Service: Endoscopy;  Laterality: N/A;  . FOOT SURGERY Bilateral   . INSERTION OF MESH N/A 12/26/2017   Procedure: INSERTION OF MESH;  Surgeon: Jules Husbands, MD;  Location: [facility] ORS;  Service: General;  Laterality: N/A;  . JOINT REPLACEMENT Right    Total Knee replacement X 2  . JOINT REPLACEMENT Bilateral    Total Hip Replacement  . knee arthroscopo Right   . LAPAROSCOPY  09/22/2014   Procedure: LAPAROSCOPY OPERATIVE;  Surgeon: Brayton Mars, MD;  Location: [facility] ORS;  Service: Gynecology;;  excision and fulgeration of endomertriosis  . LIPOMA EXCISION    . ROBOTIC ASSISTED LAPAROSCOPIC VENTRAL/INCISIONAL HERNIA REPAIR N/A 12/26/2017   Procedure: ROBOTIC ASSISTED LAPAROSCOPIC VENTRAL/INCISIONAL HERNIA REPAIR;  Surgeon: Jules Husbands, MD;  Location: [facility] ORS;  Service:  General;  Laterality: N/A;  . TONSILLECTOMY    . TOTAL HIP ARTHROPLASTY Left 12/16/2014   Procedure: TOTAL HIP ARTHROPLASTY ANTERIOR APPROACH;  Surgeon: Hessie Knows, MD;  Location: [facility] ORS;  Service: Orthopedics;  Laterality: Left;  . TOTAL HIP ARTHROPLASTY Right 05/12/2015   Procedure: TOTAL HIP ARTHROPLASTY ANTERIOR APPROACH;  Surgeon: Hessie Knows, MD;  Location: [facility] ORS;  Service: Orthopedics;  Laterality: Right;  . TOTAL KNEE REVISION Right 09/22/2015   Procedure: TOTAL KNEE REVISION/ REVISE POLYIETHYLENE;  Surgeon: Hessie Knows, MD;   Location: [facility] ORS;  Service: Orthopedics;  Laterality: Right;  . TOTAL KNEE REVISION Right 10/11/2016   Procedure: TOTAL KNEE REVISION;  Surgeon: Hessie Knows, MD;  Location: [facility] ORS;  Service: Orthopedics;  Laterality: Right;  . TUBAL LIGATION      Family History  Problem Relation Age of Onset  . Diabetes Mother   . Heart disease Father   . Heart attack Father   . Diabetes Sister   . Breast cancer Maternal Aunt        <50  . Breast cancer Paternal Aunt        x2.  <50  . Healthy Son   . Drug abuse Sister     Social History:  reports that she has been smoking cigarettes. She started smoking about 38 years ago. She has a 18.50 pack-year smoking history. She has never used smokeless tobacco. She reports that she does not drink alcohol or use drugs.  Allergies:  Allergies  Allergen Reactions  . Codeine Nausea Only  . Imitrex [Sumatriptan] Other (See Comments)    Chest pain    Medications reviewed.    ROS Full ROS performed and is otherwise negative other than what is stated in HPI   BP (!) 143/99   Pulse (!) 121   Temp (!) 97.4 F (36.3 C) (Skin)   Ht 5\' 4"  (1.626 m)   Wt 253 lb (114.8 kg)   LMP 09/26/2014 Comment: Total  SpO2 96%   BMI 43.43 kg/m   Physical Exam Vitals signs and nursing note reviewed. Exam conducted with a chaperone present.  Constitutional:      Appearance: Normal appearance. She is obese. She is not ill-appearing.  Eyes:     General: No scleral icterus.       Right eye: No discharge.        Left eye: No discharge.  Pulmonary:     Effort: Pulmonary effort is normal. No respiratory distress.     Breath sounds: No stridor.  Abdominal:     General: Abdomen is flat. There is no distension.     Palpations: There is no mass.     Tenderness: There is no abdominal tenderness. There is no guarding or rebound.     Hernia: No hernia is present.     Comments: There is some fullness on the Left UQ, given her large pannus and bogy habitus I am  unable to distinguish between a lipoma, soft tissue mass or a hernia/. No peritonitis.  Musculoskeletal: Normal range of motion.        General: No swelling.  Skin:    General: Skin is warm and dry.     Capillary Refill: Capillary refill takes less than 2 seconds.  Neurological:     General: No focal deficit present.     Mental Status: She is alert and oriented to person, place, and time.  Psychiatric:        Mood and Affect: Mood normal.  Behavior: Behavior normal.        Thought Content: Thought content normal.        Judgment: Judgment normal.       Assessment/Plan: Left Upper quadrant pain with some questionable mild subcutaneous soft tissue mass.  Given her body habitus it is impossible for me to discern between a lipoma or potential ventral hernia.  We will obtain a CT scan of the abdomen and pelvis and I will see her back with the results next week.  Abdomen is otherwise soft and there is no need for emergent surgical intervention  Caroleen Hamman, MD [place] Surgeon

## 2018-10-16 ENCOUNTER — Telehealth: Payer: Self-pay | Admitting: Family Medicine

## 2018-10-16 ENCOUNTER — Encounter: Payer: Self-pay | Admitting: Anesthesiology

## 2018-10-16 ENCOUNTER — Other Ambulatory Visit: Payer: Self-pay

## 2018-10-16 ENCOUNTER — Telehealth: Payer: Self-pay | Admitting: *Deleted

## 2018-10-16 ENCOUNTER — Ambulatory Visit (HOSPITAL_BASED_OUTPATIENT_CLINIC_OR_DEPARTMENT_OTHER): Payer: Medicare Other | Admitting: Anesthesiology

## 2018-10-16 ENCOUNTER — Other Ambulatory Visit
Admission: RE | Admit: 2018-10-16 | Discharge: 2018-10-16 | Disposition: A | Discharge status: Home / Self Care | Payer: Medicare Other | Attending: Surgery | Admitting: Surgery

## 2018-10-16 DIAGNOSIS — M150 Primary generalized (osteo)arthritis: Secondary | ICD-10-CM | POA: Diagnosis not present

## 2018-10-16 DIAGNOSIS — M5432 Sciatica, left side: Secondary | ICD-10-CM | POA: Diagnosis not present

## 2018-10-16 DIAGNOSIS — M5136 Other intervertebral disc degeneration, lumbar region: Secondary | ICD-10-CM

## 2018-10-16 DIAGNOSIS — M25552 Pain in left hip: Secondary | ICD-10-CM

## 2018-10-16 DIAGNOSIS — M47816 Spondylosis without myelopathy or radiculopathy, lumbar region: Secondary | ICD-10-CM

## 2018-10-16 DIAGNOSIS — F1190 Opioid use, unspecified, uncomplicated: Secondary | ICD-10-CM

## 2018-10-16 DIAGNOSIS — M542 Cervicalgia: Secondary | ICD-10-CM

## 2018-10-16 DIAGNOSIS — M545 Low back pain, unspecified: Secondary | ICD-10-CM

## 2018-10-16 DIAGNOSIS — G894 Chronic pain syndrome: Secondary | ICD-10-CM

## 2018-10-16 DIAGNOSIS — M25551 Pain in right hip: Secondary | ICD-10-CM

## 2018-10-16 DIAGNOSIS — M159 Polyosteoarthritis, unspecified: Secondary | ICD-10-CM

## 2018-10-16 DIAGNOSIS — R1900 Intra-abdominal and pelvic swelling, mass and lump, unspecified site: Secondary | ICD-10-CM | POA: Diagnosis not present

## 2018-10-16 DIAGNOSIS — M8949 Other hypertrophic osteoarthropathy, multiple sites: Secondary | ICD-10-CM

## 2018-10-16 LAB — CBC WITH DIFFERENTIAL/PLATELET
Abs Immature Granulocytes: 0.02 10*3/uL (ref 0.00–0.07)
Basophils Absolute: 0.1 10*3/uL (ref 0.0–0.1)
Basophils Relative: 1 %
Eosinophils Absolute: 0.2 10*3/uL (ref 0.0–0.5)
Eosinophils Relative: 3 %
HCT: 42.5 % (ref 36.0–46.0)
Hemoglobin: 14.2 g/dL (ref 12.0–15.0)
Immature Granulocytes: 0 %
Lymphocytes Relative: 21 %
Lymphs Abs: 1.5 10*3/uL (ref 0.7–4.0)
MCH: 29.6 pg (ref 26.0–34.0)
MCHC: 33.4 g/dL (ref 30.0–36.0)
MCV: 88.5 fL (ref 80.0–100.0)
Monocytes Absolute: 0.6 10*3/uL (ref 0.1–1.0)
Monocytes Relative: 8 %
Neutro Abs: 4.8 10*3/uL (ref 1.7–7.7)
Neutrophils Relative %: 67 %
Platelets: 202 10*3/uL (ref 150–400)
RBC: 4.8 MIL/uL (ref 3.87–5.11)
RDW: 14 % (ref 11.5–15.5)
WBC: 7.2 10*3/uL (ref 4.0–10.5)
nRBC: 0 % (ref 0.0–0.2)

## 2018-10-16 LAB — COMPREHENSIVE METABOLIC PANEL
ALT: 20 U/L (ref 0–44)
AST: 20 U/L (ref 15–41)
Albumin: 3.7 g/dL (ref 3.5–5.0)
Alkaline Phosphatase: 86 U/L (ref 38–126)
Anion gap: 8 (ref 5–15)
BUN: 12 mg/dL (ref 6–20)
CO2: 25 mmol/L (ref 22–32)
Calcium: 8.6 mg/dL — ABNORMAL LOW (ref 8.9–10.3)
Chloride: 106 mmol/L (ref 98–111)
Creatinine, Ser: 0.54 mg/dL (ref 0.44–1.00)
GFR calc Af Amer: >60 mL/min (ref >60)
GFR calc non Af Amer: >60 mL/min (ref >60)
Glucose, Bld: 102 mg/dL — ABNORMAL HIGH (ref 70–99)
Potassium: 3.6 mmol/L (ref 3.5–5.1)
Sodium: 139 mmol/L (ref 135–145)
Total Bilirubin: 0.8 mg/dL (ref 0.3–1.2)
Total Protein: 6.9 g/dL (ref 6.5–8.1)

## 2018-10-16 MED ORDER — OXYCODONE HCL 10 MG PO TABS: 10.0000 mg | ORAL_TABLET | Freq: Four times a day (QID) | ORAL | 0 refills | Status: AC

## 2018-10-16 MED ORDER — OXYCODONE HCL 10 MG PO TABS: 10.0000 mg | ORAL_TABLET | Freq: Four times a day (QID) | ORAL | 0 refills | Status: DC

## 2018-10-16 NOTE — Chronic Care Management (AMB) (Signed)
Chronic Care Management  ° °Note ° °10/09/2018 °Name: Jacque J Milley MRN: [id number] DOB: 05/31/1965 ° °Tiarna J Teaney is a 53 y.o. year old female who is a primary care patient of Sowles, Krichna, MD. I reached out to Ahlam J Ries by phone today in response to a referral sent by Ms. Oddie J Hammack's health plan.   ° °Ms. Pickerel was given information about Chronic Care Management services today including:  °1. CCM service includes personalized support from designated clinical staff supervised by her physician, including individualized plan of care and coordination with other care providers °2. 24/7 contact phone numbers for assistance for urgent and routine care needs. °3. Service will only be billed when office clinical staff spend 20 minutes or more in a month to coordinate care. °4. Only one practitioner may furnish and bill the service in a calendar month. °5. The patient may stop CCM services at any time (effective at the end of the month) by phone call to the office staff. °6. The patient will be responsible for cost sharing (co-pay) of up to 20% of the service fee (after annual deductible is met). ° °Patient agreed to services and verbal consent obtained.  ° °Follow up plan: °Telephone appointment with CCM team member scheduled for: 10/12/2018 ° °Bernice Cicero °Care Guide • [facility] °[place]   [facility]  °[email or web address]   ??[phone]   ° ° ° °

## 2018-10-16 NOTE — Progress Notes (Signed)
Virtual Visit via Telephone Note  I connected with Wendy Johnson on 10/16/18 at  1:30 PM EDT by telephone and verified that I am speaking with the correct person using two identifiers.  Location: Patient: Home Provider: Pain control center   I discussed the limitations, risks, security and privacy concerns of performing an evaluation and management service by telephone and the availability of in person appointments. I also discussed with the patient that there may be a patient responsible charge related to this service. The patient expressed understanding and agreed to proceed.   History of Present Illness: I spoke with Wendy Johnson regarding her low back pain today via telephone conversation.  Secondary to the COVID crisis she had no access to visual virtual and we therefore spoke via phone.  She states that she is having more spasming in the low back with some radiation into both calves.  This is consistent with what she has experienced in the past.  She has had epidural steroid injections that generally give her 75 to 80% relief for approximately 2 months she reports.  She is taking her medications as prescribed and these work well for her.  Otherwise no side effects are noted with the medicines and she is trying to do her stretching strengthening exercises with minimal relief.  No other changes are reported.    Observations/Objective:  Current Outpatient Medications:  .  albuterol (PROVENTIL) (2.5 MG/3ML) 0.083% nebulizer solution, Take 3 mLs (2.5 mg total) by nebulization every 6 (six) hours as needed for wheezing or shortness of breath., Disp: 75 mL, Rfl: 1 .  amphetamine-dextroamphetamine (ADDERALL) 15 MG tablet, Take 1 tablet by mouth daily., Disp: , Rfl:  .  amphetamine-dextroamphetamine (ADDERALL) 30 MG tablet, Take 30 mg by mouth 2 (two) times daily., Disp: , Rfl:  .  atorvastatin (LIPITOR) 40 MG tablet, Take 1 tablet (40 mg total) by mouth daily., Disp: 90 tablet, Rfl: 0 .  busPIRone  (BUSPAR) 30 MG tablet, Take 30 mg by mouth 2 (two) times daily. , Disp: , Rfl:  .  clonazePAM (KLONOPIN) 1 MG tablet, Take 2 mg by mouth 3 (three) times daily., Disp: , Rfl:  .  cyclobenzaprine (FLEXERIL) 5 MG tablet, Take 1 tablet (5 mg total) by mouth 3 (three) times daily as needed for muscle spasms., Disp: 30 tablet, Rfl: 0 .  DEXILANT 60 MG capsule, TAKE 1 CAPSULE BY MOUTH DAILY, Disp: 30 capsule, Rfl: 5 .  hydrOXYzine (ATARAX/VISTARIL) 25 MG tablet, Take 25 mg by mouth every 6 (six) hours as needed for anxiety or itching (hives)., Disp: , Rfl:  .  ibuprofen (ADVIL) 800 MG tablet, Take 1 tablet (800 mg total) by mouth every 8 (eight) hours as needed., Disp: 30 tablet, Rfl: 0 .  LATUDA 40 MG TABS tablet, Take 40 mg by mouth daily with breakfast. , Disp: , Rfl:  .  naloxone (NARCAN) nasal spray 4 mg/0.1 mL, For respiratory suppression from opioids, Disp: 1 kit, Rfl: 2 .  omega-3 acid ethyl esters (LOVAZA) 1 g capsule, Take 2 capsules (2 g total) by mouth 2 (two) times daily., Disp: 360 capsule, Rfl: 0 .  [START ON 10/20/2018] Oxycodone HCl 10 MG TABS, Take 1 tablet (10 mg total) by mouth 4 (four) times daily for 30 days., Disp: 120 tablet, Rfl: 0 .  [START ON 11/19/2018] Oxycodone HCl 10 MG TABS, Take 1 tablet (10 mg total) by mouth 4 (four) times daily for 30 days., Disp: 120 tablet, Rfl: 0 .  PROAIR HFA  108 (90 Base) MCG/ACT inhaler, Inhale 2 puffs into the lungs every 4 (four) hours as needed for wheezing or shortness of breath., Disp: 8.5 g, Rfl: 0 .  sertraline (ZOLOFT) 100 MG tablet, Take 100 mg by mouth 2 (two) times daily., Disp: , Rfl:  .  tiotropium (SPIRIVA HANDIHALER) 18 MCG inhalation capsule, Place 1 capsule (18 mcg total) into inhaler and inhale daily., Disp: 30 capsule, Rfl: 5 .  traZODone (DESYREL) 100 MG tablet, Take 400 mg by mouth at bedtime. , Disp: , Rfl:    Assessment and Plan: 1. DDD (degenerative disc disease), lumbar   2. Sciatica of left side   3. Primary  osteoarthritis involving multiple joints   4. Low back pain at multiple sites   5. Chronic, continuous use of opioids   6. Facet arthritis of lumbar region   7. Chronic pain syndrome   8. Cervicalgia   9. Pain of both hip joints   Based on our conversation today and upon review of the [facility] practitioner database information I am going to refill her medications.  This will be for June 13 and July 13 refill.  This would be for her oxycodone 10 mg tablets.  I want her to continue follow-up with her primary care physicians for baseline medical care with return to clinic in approximately 1 month for an epidural steroid injection.   Follow Up Instructions:    I discussed the assessment and treatment plan with the patient. The patient was provided an opportunity to ask questions and all were answered. The patient agreed with the plan and demonstrated an understanding of the instructions.   The patient was advised to call back or seek an in-person evaluation if the symptoms worsen or if the condition fails to improve as anticipated.  I provided 25 minutes of non-face-to-face time during this encounter.   Molli Barrows, MD

## 2018-10-16 NOTE — Progress Notes (Signed)
This encounter was created in error - please disregard.

## 2018-10-16 NOTE — Telephone Encounter (Signed)
Notified patient as instructed,Labs was normal .  patient pleased. Discussed follow-up appointments, patient agrees

## 2018-10-16 NOTE — Addendum Note (Signed)
Addended by: Clerance Lav on: 10/16/2018 08:48 AM   Modules accepted: Level of Service, SmartSet

## 2018-10-22 ENCOUNTER — Other Ambulatory Visit
Admission: RE | Admit: 2018-10-22 | Discharge: 2018-10-22 | Disposition: A | Discharge status: Home / Self Care | Payer: Medicare Other | Source: Ambulatory Visit | Attending: Anesthesiology | Admitting: Anesthesiology

## 2018-10-22 ENCOUNTER — Other Ambulatory Visit: Payer: Self-pay

## 2018-10-22 DIAGNOSIS — Z1159 Encounter for screening for other viral diseases: Secondary | ICD-10-CM | POA: Insufficient documentation

## 2018-10-23 LAB — NOVEL CORONAVIRUS, NAA (HOSP ORDER, SEND-OUT TO REF LAB; TAT 18-24 HRS): SARS-CoV-2, NAA: NOT DETECTED

## 2018-10-25 ENCOUNTER — Ambulatory Visit
Admission: RE | Admit: 2018-10-25 | Discharge: 2018-10-25 | Disposition: A | Discharge status: Home / Self Care | Payer: Medicare Other | Source: Ambulatory Visit | Attending: Surgery | Admitting: Surgery

## 2018-10-25 ENCOUNTER — Other Ambulatory Visit: Payer: Self-pay

## 2018-10-25 ENCOUNTER — Ambulatory Visit: Payer: Medicare Other | Admitting: Anesthesiology

## 2018-10-25 DIAGNOSIS — R1900 Intra-abdominal and pelvic swelling, mass and lump, unspecified site: Secondary | ICD-10-CM | POA: Insufficient documentation

## 2018-10-25 DIAGNOSIS — K439 Ventral hernia without obstruction or gangrene: Secondary | ICD-10-CM | POA: Diagnosis not present

## 2018-10-25 MED ORDER — IOHEXOL 300 MG/ML  SOLN
125.0000 mL | Freq: Once | INTRAMUSCULAR | Status: AC | PRN
  Administered 2018-10-25: 150 mL via INTRAVENOUS

## 2018-10-26 ENCOUNTER — Telehealth: Payer: Self-pay

## 2018-10-26 ENCOUNTER — Ambulatory Visit (INDEPENDENT_AMBULATORY_CARE_PROVIDER_SITE_OTHER): Payer: Medicare Other

## 2018-10-26 DIAGNOSIS — E785 Hyperlipidemia, unspecified: Secondary | ICD-10-CM | POA: Diagnosis not present

## 2018-10-26 DIAGNOSIS — R1902 Left upper quadrant abdominal swelling, mass and lump: Secondary | ICD-10-CM

## 2018-10-26 NOTE — Telephone Encounter (Signed)
Left message to call office regarding CT results. Keep scheduled appointment 10/31/18 at 9 am Dr.Pabon.

## 2018-10-26 NOTE — Telephone Encounter (Signed)
Patient notified of Ct results and reminded of appointment .

## 2018-10-26 NOTE — Chronic Care Management (AMB) (Signed)
Chronic Care Management   Follow Up Note   10/26/2018 Name: Wendy Johnson MRN: [id number] DOB: 1966/02/07  Referred by: Steele Sizer, MD Reason for referral : Chronic Care Management (follow up dyslipidemia/food stamp application)   Subjective: 'I am not feeling good because of this pain I have in my side" "I have a new hernia that I have to get fixed"   Objective:  Lab Results  Component Value Date   CHOL 201 (H) 10/04/2018   CHOL 171 01/22/2018   CHOL 157 07/19/2017   Lab Results  Component Value Date   HDL 36 (L) 10/04/2018   HDL 31 (L) 01/22/2018   HDL 30 (L) 07/19/2017   Lab Results  Component Value Date   [facility]  10/04/2018     Comment:     . LDL cholesterol not calculated. Triglyceride levels greater than 400 mg/dL invalidate calculated LDL results. . Reference range: <100 . Desirable range <100 mg/dL for primary prevention;   <70 mg/dL for patients with CHD or diabetic patients  with > or = 2 CHD risk factors. Marland Kitchen LDL-C is now calculated using the Martin-Hopkins  calculation, which is a validated novel method providing  better accuracy than the Friedewald equation in the  estimation of LDL-C.  Cresenciano Genre et al. Annamaria Helling. 5681;275(17): 2061-2068  (http://education.[employer].com/faq/FAQ164)    [place] [address] 01/22/2018   [place]  07/19/2017     Comment:     . LDL cholesterol not calculated. Triglyceride levels greater than 400 mg/dL invalidate calculated LDL results. . Reference range: <100 . Desirable range <100 mg/dL for primary prevention;   <70 mg/dL for patients with CHD or diabetic patients  with > or = 2 CHD risk factors. Marland Kitchen LDL-C is now calculated using the Martin-Hopkins  calculation, which is a validated novel method providing  better accuracy than the Friedewald equation in the  estimation of LDL-C.  Cresenciano Genre et al. Annamaria Helling. 0017;494(49): 2061-2068  (http://education.[employer].com/faq/FAQ164)    Lab Results  Component Value  Date   TRIG 558 (H) 10/04/2018   TRIG 372 (H) 01/22/2018   TRIG 405 (H) 07/19/2017   Lab Results  Component Value Date   CHOLHDL 5.6 (H) 10/04/2018   CHOLHDL 5.5 (H) 01/22/2018   CHOLHDL 5.2 (H) 07/19/2017   No results found for: LDLDIRECT  Assessment: Wendy Johnson a 53year old femalewho is a primary care patient of Steele Sizer, MD. Wendy Johnson was referred to the CCM Team by her health plan. She has a history of but not limited to Moderate COPD, Dyslipidemia, Metabolic Syndrome, bipolar disorder, and osteoarthritis involving multiple joints. 10/12/2018 CCM RN CM completed a telephone assessment of patients health and discussed health goals. Today I follow up with Wendy Johnson to make sure she received educational materials by mail and to assess for questions or concerns.   Review of patient status, including review of consultants reports, relevant laboratory and other test results, and collaboration with appropriate care team members and the patient's provider was performed as part of comprehensive patient evaluation and provision of chronic care management services.    Goals Addressed            This Visit's Progress   . Dr. Ancil Boozer says my cholesterol is getting worse (pt-stated)   On track    Wendy Johnson has received educational material in the mail and has read thoroughly. She is appreciative of the assistance with health goals by RN CM. Today she is not feeling well. She has left upper quad  pain related to a new diagnoses hernia confirmed by CT 10/25/2018. She has an appointment with Dr. Dahlia Byes 10/31/2018 at 9:00. She has not completed the application for food stamps but plans to do that this weekend. She is trying to limit the saturated fats in her diet, but unable to exercise secondary to hernia pain and chronic back pain. RN CM will continue to follow and schedule a call for after she sees Dr. Dahlia Byes.   Current Barriers:  Marland Kitchen Knowledge Deficits related to understanding  dyslipidemia   . Inability to purchase "healthy foods" secondary to financial strain  Nurse Case Manager Clinical Goal(s):  Marland Kitchen Over the next 14 days, patient will verbalize understanding of plan for managing her dyslipidemia through medication adherence, lipid lowering diet, and exercise . Over the next 30 days, patient will continue to follow her established treatment plan for lowering lipids .   Interventions:  . Evaluation of current treatment plan related to dyslipidemia and patient's adherence to plan as established by provider. . Confirmed that patient picked up cholesterol medications from pharmacy and is taking as directed . Discussed plans with patient for ongoing care management follow up and provided patient with direct contact information for care management team . Reinforced utilization for previously given food bank resources if needed . Assessed for completion and submission of food stamps application   Patient Self Care Activities:  . Self administers medications as prescribed . Attends all scheduled provider appointments . Calls pharmacy for medication refills . Calls provider office for new concerns or questions  . Adhere to lipid lowering diet . Exercise as tolerated  Please see past updates related to this goal by clicking on the "Past Updates" button in the selected goal          Telephone follow up appointment with care management team member scheduled for: next week    Amelie Caracci E. Rollene Rotunda, RN, BSN Nurse Care Coordinator [facility] Care Management  [phone]

## 2018-10-27 NOTE — Patient Instructions (Signed)
Thank you allowing the Chronic Care Management Team to be a part of your care! It was a pleasure speaking with you today!   1. Please limits your intake of fried foods, saturated fats, and processed sweets. 2. Try to remain as active as you can. Exercise is a great way to lower your cholesterol and raise your good cholesterol. 3. Keep your upcoming appointment with Dr. Dahlia Byes  CCM (Chronic Care Management) Team   Trish Fountain RN, BSN Nurse Care Coordinator  [phone]  Ruben Reason PharmD  Clinical Pharmacist  [phone]   Elliot Gurney, LCSW Clinical Social Worker [phone]  Goals Addressed            This Visit's Progress   . Dr. Ancil Boozer says my cholesterol is getting worse (pt-stated)   On track    Current Barriers:  Marland Kitchen Knowledge Deficits related to understanding dyslipidemia   . Inability to purchase "healthy foods" secondary to financial strain  Nurse Case Manager Clinical Goal(s):  Marland Kitchen Over the next 14 days, patient will verbalize understanding of plan for managing her dyslipidemia through medication adherence, lipid lowering diet, and exercise . Over the next 30 days, patient will continue to follow her established treatment plan for lowering lipids .   Interventions:  . Evaluation of current treatment plan related to dyslipidemia and patient's adherence to plan as established by provider. . Confirmed that patient picked up cholesterol medications from pharmacy and is taking as directed . Discussed plans with patient for ongoing care management follow up and provided patient with direct contact information for care management team . Reinforced utilization for previously given food bank resources if needed . Assessed for completion and submission of food stamps application   Patient Self Care Activities:  . Self administers medications as prescribed . Attends all scheduled provider appointments . Calls pharmacy for medication refills . Calls provider  office for new concerns or questions  . Adhere to lipid lowering diet . Exercise as tolerated  Please see past updates related to this goal by clicking on the "Past Updates" button in the selected goal         The patient verbalized understanding of instructions provided today and declined a print copy of patient instruction materials.   Telephone follow up appointment with care management team member scheduled for:  SYMPTOMS OF A STROKE   You have any symptoms of stroke. "BE FAST" is an easy way to remember the main warning signs: ? B - Balance. Signs are dizziness, sudden trouble walking, or loss of balance. ? E - Eyes. Signs are trouble seeing or a sudden change in how you see. ? F - Face. Signs are sudden weakness or loss of feeling of the face, or the face or eyelid drooping on one side. ? A - Arms. Signs are weakness or loss of feeling in an arm. This happens suddenly and usually on one side of the body. ? S - Speech. Signs are sudden trouble speaking, slurred speech, or trouble understanding what people say. ? T - Time. Time to call emergency services. Write down what time symptoms started.  You have other signs of stroke, such as: ? A sudden, very bad headache with no known cause. ? Feeling sick to your stomach (nausea). ? Throwing up (vomiting). ? Jerky movements you cannot control (seizure).  SYMPTOMS OF A HEART ATTACK  What are the signs or symptoms? Symptoms of this condition include:  Chest pain. It may feel like: ? Crushing or  squeezing. ? Tightness, pressure, fullness, or heaviness.  Pain in the arm, neck, jaw, back, or upper body.  Shortness of breath.  Heartburn.  Indigestion.  Nausea.  Cold sweats.  Feeling tired.  Sudden lightheadedness.

## 2018-10-28 DIAGNOSIS — G4733 Obstructive sleep apnea (adult) (pediatric): Secondary | ICD-10-CM | POA: Diagnosis not present

## 2018-10-28 DIAGNOSIS — J449 Chronic obstructive pulmonary disease, unspecified: Secondary | ICD-10-CM | POA: Diagnosis not present

## 2018-10-29 ENCOUNTER — Ambulatory Visit: Payer: Medicare Other | Admitting: Surgery

## 2018-10-30 ENCOUNTER — Ambulatory Visit: Payer: Self-pay | Admitting: Pharmacist

## 2018-10-30 DIAGNOSIS — E1169 Type 2 diabetes mellitus with other specified complication: Secondary | ICD-10-CM

## 2018-10-30 DIAGNOSIS — E785 Hyperlipidemia, unspecified: Secondary | ICD-10-CM

## 2018-10-30 DIAGNOSIS — F1021 Alcohol dependence, in remission: Secondary | ICD-10-CM

## 2018-10-30 NOTE — Chronic Care Management (AMB) (Signed)
  Chronic Care Management   Note  10/30/2018 Name: Wendy Johnson MRN: [id number] DOB: 10/06/1965  53 y.o. year old female referred to [facility] clinical pharmacist via internal referral from [facility] for cholesterol management and medication (atorvastatin 40 mg).   Was unable to reach patient via telephone today and have left HIPAA compliant voicemail asking patient to return my call. (unsuccessful outreach #1).  Follow up plan: A HIPPA compliant phone message was left for the patient providing contact information and requesting a return call.  The care management team will reach out to the patient again over the next 5-7 days.   Ruben Reason, PharmD Clinical Pharmacist [facility] [phone]

## 2018-10-31 ENCOUNTER — Ambulatory Visit (INDEPENDENT_AMBULATORY_CARE_PROVIDER_SITE_OTHER): Payer: Medicare Other | Admitting: Surgery

## 2018-10-31 ENCOUNTER — Telehealth: Payer: Self-pay

## 2018-10-31 ENCOUNTER — Other Ambulatory Visit: Payer: Self-pay

## 2018-10-31 ENCOUNTER — Telehealth: Payer: Self-pay | Admitting: *Deleted

## 2018-10-31 ENCOUNTER — Encounter: Payer: Self-pay | Admitting: Surgery

## 2018-10-31 ENCOUNTER — Encounter: Payer: Self-pay | Admitting: *Deleted

## 2018-10-31 VITALS — BP 130/87 | HR 92 | Temp 97.7°F | Ht 64.0 in | Wt 253.4 lb

## 2018-10-31 DIAGNOSIS — K432 Incisional hernia without obstruction or gangrene: Secondary | ICD-10-CM

## 2018-10-31 MED ORDER — HYDROCODONE-ACETAMINOPHEN 5-325 MG PO TABS: 1.0000 | ORAL_TABLET | Freq: Four times a day (QID) | ORAL | 0 refills | Status: DC | PRN

## 2018-10-31 NOTE — Progress Notes (Signed)
Patient's surgery to be scheduled for 11-08-18 (day per Dr. Dahlia Byes) at [facility] with Dr. Dahlia Byes.   The patient is aware to have COVID-19 testing done on 11-05-18 at the [facility] building drive thru ([address] [place]). She is aware to isolate after, have no visitors, wash hands frequently, and avoid touching face. We will try and coordinate COVID testing and Pre-admit appointment.   The patient is aware she will need to Pre-Admit. Patient will check in at the [facility] entrance due to COVID-19 restrictions and will then be escorted to the Progress, [address] (first floor). Patient will be contacted once Pre-admission appointment has been arranged with date and time.   The patient verbalizes understanding of the above.

## 2018-10-31 NOTE — Telephone Encounter (Signed)
Patient contacted today and surgery date confirmed for 11-08-18 with Dr. Dahlia Byes.   The patient is aware to Pre-admit on 11-05-18 at 10 am and have COVID testing done after.   Patient aware to call the office if she has further questions.

## 2018-10-31 NOTE — Telephone Encounter (Signed)
Pt called to informDr Andree Elk that Her Surgeon Dr Dahlia Byes has prescribed her 12 Vicodin 5/325 for hernia pain, she is scheduled for surgery on 07/02. Pt wanted to make [place] aware so she doesn't void contract.

## 2018-10-31 NOTE — Telephone Encounter (Signed)
Documented in patients chart.

## 2018-10-31 NOTE — H&P (View-Only) (Signed)
Outpatient Surgical Follow Up  10/31/2018  Wendy Johnson is an 53 y.o. female.   Chief Complaint  Patient presents with  . Follow-up     f/u for left abdominal mass, CT done 10/25/18    HPI: 53 year old female with a prior history of robotic ventral hernia now comes with a left upper quadrant hernia consistent with an Spigelian vs incisional hernia.  Does have significant comorbidities including bipolar disorder, chronic back pain, COPD actively smoking. He reports daily severe symptoms that sometimes cause some nausea and vomiting.  Pain is located in the left upper quadrant is severe and sharp in nature.  Pain sometimes increases with Valsalva.  No specific alleviating factors.  Past Medical History:  Diagnosis Date  . ADHD (attention deficit hyperactivity disorder)   . Anxiety   . AR (allergic rhinitis)   . Arthritis   . Benign hypertension    NO MEDS  . Bipolar disorder ([place])   . Chronic back pain   . Chronic constipation   . Chronic insomnia   . COPD (chronic obstructive pulmonary disease) ([place])   . Deaf    RIGHT EAR  . Decreased dorsalis pedis pulse   . Depression   . Diabetes mellitus without complication ([place])    history of DM but no longer has  . Dyslipidemia   . Fatty liver   . GERD (gastroesophageal reflux disease)   . Hepatomegaly   . High cholesterol   . Hot flashes   . Migraine with aura   . OCD (obsessive compulsive disorder)   . Plantar warts   . Severe obesity ([place])   . Shortness of breath dyspnea   . Sleep apnea    NO CPAP  . Tobacco use   . Trochanteric bursitis of right hip     Past Surgical History:  Procedure Laterality Date  . ABDOMINAL HYSTERECTOMY N/A 10/20/2014   Procedure: Total abdominial hysterectomy, bilateral salpingo-oophorectomy;  Surgeon: Brayton Mars, MD;  Location: [facility] ORS;  Service: Gynecology;  Laterality: N/A;  . BILATERAL SALPINGOOPHORECTOMY    . bone spurs removed Bilateral   . CARPAL TUNNEL RELEASE Left  06/08/2017   Procedure: CARPAL TUNNEL RELEASE;  Surgeon: Hessie Knows, MD;  Location: [facility] ORS;  Service: Orthopedics;  Laterality: Left;  . CARPAL TUNNEL RELEASE Right 10/10/2017   Procedure: CARPAL TUNNEL RELEASE;  Surgeon: Hessie Knows, MD;  Location: [facility] ORS;  Service: Orthopedics;  Laterality: Right;  . COLONOSCOPY WITH PROPOFOL N/A 09/01/2016   Procedure: COLONOSCOPY WITH PROPOFOL;  Surgeon: Jonathon Bellows, MD;  Location: [facility] ENDOSCOPY;  Service: Endoscopy;  Laterality: N/A;  . DORSAL COMPARTMENT RELEASE Left 06/08/2017   Procedure: RELEASE DORSAL COMPARTMENT (DEQUERVAIN);  Surgeon: Hessie Knows, MD;  Location: [facility] ORS;  Service: Orthopedics;  Laterality: Left;  . ESOPHAGOGASTRODUODENOSCOPY (EGD) WITH PROPOFOL N/A 09/01/2016   Procedure: ESOPHAGOGASTRODUODENOSCOPY (EGD) WITH PROPOFOL;  Surgeon: Jonathon Bellows, MD;  Location: [facility] ENDOSCOPY;  Service: Endoscopy;  Laterality: N/A;  . FOOT SURGERY Bilateral   . INSERTION OF MESH N/A 12/26/2017   Procedure: INSERTION OF MESH;  Surgeon: Jules Husbands, MD;  Location: [facility] ORS;  Service: General;  Laterality: N/A;  . JOINT REPLACEMENT Right    Total Knee replacement X 2  . JOINT REPLACEMENT Bilateral    Total Hip Replacement  . knee arthroscopo Right   . LAPAROSCOPY  09/22/2014   Procedure: LAPAROSCOPY OPERATIVE;  Surgeon: Brayton Mars, MD;  Location: [facility] ORS;  Service: Gynecology;;  excision and fulgeration of endomertriosis  .  LIPOMA EXCISION    . ROBOTIC ASSISTED LAPAROSCOPIC VENTRAL/INCISIONAL HERNIA REPAIR N/A 12/26/2017   Procedure: ROBOTIC ASSISTED LAPAROSCOPIC VENTRAL/INCISIONAL HERNIA REPAIR;  Surgeon: Jules Husbands, MD;  Location: [facility] ORS;  Service: General;  Laterality: N/A;  . TONSILLECTOMY    . TOTAL HIP ARTHROPLASTY Left 12/16/2014   Procedure: TOTAL HIP ARTHROPLASTY ANTERIOR APPROACH;  Surgeon: Hessie Knows, MD;  Location: [facility] ORS;  Service: Orthopedics;  Laterality: Left;  . TOTAL HIP ARTHROPLASTY Right 05/12/2015   Procedure:  TOTAL HIP ARTHROPLASTY ANTERIOR APPROACH;  Surgeon: Hessie Knows, MD;  Location: [facility] ORS;  Service: Orthopedics;  Laterality: Right;  . TOTAL KNEE REVISION Right 09/22/2015   Procedure: TOTAL KNEE REVISION/ REVISE POLYIETHYLENE;  Surgeon: Hessie Knows, MD;  Location: [facility] ORS;  Service: Orthopedics;  Laterality: Right;  . TOTAL KNEE REVISION Right 10/11/2016   Procedure: TOTAL KNEE REVISION;  Surgeon: Hessie Knows, MD;  Location: [facility] ORS;  Service: Orthopedics;  Laterality: Right;  . TUBAL LIGATION      Family History  Problem Relation Age of Onset  . Diabetes Mother   . Heart disease Father   . Heart attack Father   . Diabetes Sister   . Breast cancer Maternal Aunt        <50  . Breast cancer Paternal Aunt        x2.  <50  . Healthy Son   . Drug abuse Sister     Social History:  reports that she has been smoking cigarettes. She started smoking about 38 years ago. She has a 18.50 pack-year smoking history. She has never used smokeless tobacco. She reports that she does not drink alcohol or use drugs.  Allergies:  Allergies  Allergen Reactions  . Codeine Nausea Only  . Imitrex [Sumatriptan] Other (See Comments)    Chest pain    Medications reviewed.    ROS Full ROS performed and is otherwise negative other than what is stated in HPI   BP 130/87   Pulse 92   Temp 97.7 F (36.5 C) (Temporal)   Ht 5\' 4"  (1.626 m)   Wt 253 lb 6.4 oz (114.9 kg)   LMP 09/26/2014 Comment: Total  SpO2 98%   BMI 43.50 kg/m   Physical Exam Vitals signs and nursing note reviewed. Exam conducted with a chaperone present.  Constitutional:      Appearance: Normal appearance. She is obese.  Eyes:     General: No scleral icterus.       Right eye: No discharge.        Left eye: No discharge.  Neck:     Musculoskeletal: Normal range of motion and neck supple.  Cardiovascular:     Rate and Rhythm: Normal rate.     Pulses: Normal pulses.  Pulmonary:     Effort: Pulmonary effort is normal.  No respiratory distress.     Breath sounds: Normal breath sounds.  Abdominal:     General: Abdomen is flat. There is no distension.     Tenderness: There is abdominal tenderness. There is no rebound.     Hernia: A hernia is present.     Comments: Left upper quadrant ventral hernia measuring approximately 3 cm it is significantly tender but reduces easily  Skin:    General: Skin is warm and dry.     Capillary Refill: Capillary refill takes less than 2 seconds.  Neurological:     General: No focal deficit present.     Mental Status: She is oriented to person, place,  and time.  Psychiatric:        Mood and Affect: Mood normal.        Behavior: Behavior normal.        Thought Content: Thought content normal.        Judgment: Judgment normal.         Assessment/Plan: 53 year old female with severe pain associated to a left Spigelian vs incisional ventral hernia.  He is morbidly obese and she smokes and she does have significant psychiatric history.  Discussed with her in detail about the ideal circumstances of risk factor modification.  I firmly believe that this is as good as we are going to get from her medical perspective.  Is having daily severe symptoms that interferes with her quality of life and I suspect that she is having also intermittent episodes of incarceration. That reason I do recommend proceeding to the operating room for an open ventral hernia repair.  She understands that she has significant risk factors that may contribute to a possible complication of her surgery.  I have discussed at length with her my thought process.  The surgery itself the risk of bleeding, infection, chronic pain.  She understands and wishes to proceed.  Greater than 50% of the 25 minutes  visit was spent in counseling/coordination of care   Caroleen Hamman, MD [place] Surgeon

## 2018-10-31 NOTE — Patient Instructions (Addendum)
The patient is aware to call back for any questions or new concerns. Contract of pain management  Hernia, Adult     A hernia is the bulging of an organ or tissue through a weak spot in the muscles of the abdomen (abdominal wall). Hernias develop most often near the belly button (navel) or the area where the leg meets the lower abdomen (groin). Common types of hernias include:  Incisional hernia. This type bulges through a scar from an abdominal surgery.  Umbilical hernia. This type develops near the navel.  Inguinal hernia. This type develops in the groin or scrotum.  Femoral hernia. This type develops under the groin, in the upper thigh area.  Hiatal hernia. This type occurs when part of the stomach slides above the muscle that separates the abdomen from the chest (diaphragm). What are the causes? This condition may be caused by:  Heavy lifting.  Coughing over a long period of time.  Straining to have a bowel movement. Constipation can lead to straining.  An incision made during an abdominal surgery.  A physical problem that is present at birth (congenital defect).  Being overweight or obese.  Smoking.  Excess fluid in the abdomen.  Undescended testicles in males. What are the signs or symptoms? The main symptom is a skin-colored, rounded bulge in the area of the hernia. However, a bulge may not always be present. It may grow bigger or be more visible when you cough or strain (such as when lifting something heavy). A hernia that can be pushed back into the area (is reducible) rarely causes pain. A hernia that cannot be pushed back into the area (is incarcerated) may lose its blood supply (become strangulated). A hernia that is incarcerated may cause:  Pain.  Fever.  Nausea and vomiting.  Swelling.  Constipation. How is this diagnosed? A hernia may be diagnosed based on:  Your symptoms and medical history.  A physical exam. Your health care provider may ask you  to cough or move in certain ways to see if the hernia becomes visible.  Imaging tests, such as: ? X-rays. ? Ultrasound. ? CT scan. How is this treated? A hernia that is small and painless may not need to be treated. A hernia that is large or painful may be treated with surgery. Inguinal hernias may be treated with surgery to prevent incarceration or strangulation. Strangulated hernias are always treated with surgery because a lack of blood supply to the trapped organ or tissue can cause it to die. Surgery to treat a hernia involves pushing the bulge back into place and repairing the weak area of the muscle or abdominal wall. Follow these instructions at home: Activity  Avoid straining.  Do not lift anything that is heavier than 10 lb (4.5 kg), or the limit that you are told, until your health care provider says that it is safe.  When lifting heavy objects, lift with your leg muscles, not your back muscles. Preventing constipation  Take actions to prevent constipation. Constipation leads to straining with bowel movements, which can make a hernia worse or cause a hernia repair to break down. Your health care provider may recommend that you: ? Drink enough fluid to keep your urine pale yellow. ? Eat foods that are high in fiber, such as fresh fruits and vegetables, whole grains, and beans. ? Limit foods that are high in fat and processed sugars, such as fried or sweet foods. ? Take an over-the-counter or prescription medicine for constipation. General instructions  When coughing, try to cough gently.  You may try to push the hernia back in place by very gently pressing on it while lying down. Do not try to force the bulge back in if it will not push in easily.  If you are overweight, work with your health care provider to lose weight safely.  Do not use any products that contain nicotine or tobacco, such as cigarettes and e-cigarettes. If you need help quitting, ask your health care  provider.  If you are scheduled for hernia repair, watch your hernia for any changes in shape, size, or color. Tell your health care provider about any changes or new symptoms.  Take over-the-counter and prescription medicines only as told by your health care provider.  Keep all follow-up visits as told by your health care provider. This is important. Contact a health care provider if:  You develop new pain, swelling, or redness around your hernia.  You have signs of constipation, such as: ? Fewer bowel movements in a week than normal. ? Difficulty having a bowel movement. ? Stools that are dry, hard, or larger than normal. Get help right away if:  You have a fever.  You have abdomen pain that gets worse.  You feel nauseous or you vomit.  You cannot push the hernia back in place by very gently pressing on it while lying down. Do not try to force the bulge back in if it will not push in easily.  The hernia: ? Changes in shape, size, or color. ? Feels hard or tender. These symptoms may represent a serious problem that is an emergency. Do not wait to see if the symptoms will go away. Get medical help right away. Call your local emergency services (911 in the [place] Summary  A hernia is the bulging of an organ or tissue through a weak spot in the muscles of the abdomen (abdominal wall).  The main symptom is a skin-colored, rounded lump (bulge) in the hernia area. However, a bulge may not always be present. It may grow bigger or more visible when you cough or strain (such as when having a bowel movement).  A hernia that is small and painless may not need to be treated. A hernia that is large or painful may be treated with surgery.  Surgery to treat a hernia involves pushing the bulge back into place and repairing the weak part of the abdomen. This information is not intended to replace advice given to you by your health care provider. Make sure you discuss any questions you have with  your health care provider. Document Released: 04/25/2005 Document Revised: 01/25/2017 Document Reviewed: 01/25/2017 Elsevier Interactive Patient Education  2019 [employer].

## 2018-10-31 NOTE — Progress Notes (Signed)
Outpatient Surgical Follow Up  10/31/2018  Wendy Johnson is an 53 y.o. female.   Chief Complaint  Patient presents with  . Follow-up     f/u for left abdominal mass, CT done 10/25/18    HPI: 53 year old female with a prior history of robotic ventral hernia now comes with a left upper quadrant hernia consistent with an Spigelian vs incisional hernia.  Does have significant comorbidities including bipolar disorder, chronic back pain, COPD actively smoking. He reports daily severe symptoms that sometimes cause some nausea and vomiting.  Pain is located in the left upper quadrant is severe and sharp in nature.  Pain sometimes increases with Valsalva.  No specific alleviating factors.  Past Medical History:  Diagnosis Date  . ADHD (attention deficit hyperactivity disorder)   . Anxiety   . AR (allergic rhinitis)   . Arthritis   . Benign hypertension    NO MEDS  . Bipolar disorder ([place])   . Chronic back pain   . Chronic constipation   . Chronic insomnia   . COPD (chronic obstructive pulmonary disease) ([place])   . Deaf    RIGHT EAR  . Decreased dorsalis pedis pulse   . Depression   . Diabetes mellitus without complication ([employer])    history of DM but no longer has  . Dyslipidemia   . Fatty liver   . GERD (gastroesophageal reflux disease)   . Hepatomegaly   . High cholesterol   . Hot flashes   . Migraine with aura   . OCD (obsessive compulsive disorder)   . Plantar warts   . Severe obesity ([place])   . Shortness of breath dyspnea   . Sleep apnea    NO CPAP  . Tobacco use   . Trochanteric bursitis of right hip     Past Surgical History:  Procedure Laterality Date  . ABDOMINAL HYSTERECTOMY N/A 10/20/2014   Procedure: Total abdominial hysterectomy, bilateral salpingo-oophorectomy;  Surgeon: Brayton Mars, MD;  Location: [facility] ORS;  Service: Gynecology;  Laterality: N/A;  . BILATERAL SALPINGOOPHORECTOMY    . bone spurs removed Bilateral   . CARPAL TUNNEL RELEASE Left  06/08/2017   Procedure: CARPAL TUNNEL RELEASE;  Surgeon: Hessie Knows, MD;  Location: [facility] ORS;  Service: Orthopedics;  Laterality: Left;  . CARPAL TUNNEL RELEASE Right 10/10/2017   Procedure: CARPAL TUNNEL RELEASE;  Surgeon: Hessie Knows, MD;  Location: [facility] ORS;  Service: Orthopedics;  Laterality: Right;  . COLONOSCOPY WITH PROPOFOL N/A 09/01/2016   Procedure: COLONOSCOPY WITH PROPOFOL;  Surgeon: Jonathon Bellows, MD;  Location[facility] ENDOSCOPY;  Service: Endoscopy;  Laterality: N/A;  . DORSAL COMPARTMENT RELEASE Left 06/08/2017   Procedure: RELEASE DORSAL COMPARTMENT (DEQUERVAIN);  Surgeon: Hessie Knows, MD;  Location: [facility] ORS;  Service: Orthopedics;  Laterality: Left;  . ESOPHAGOGASTRODUODENOSCOPY (EGD) WITH PROPOFOL N/A 09/01/2016   Procedure: ESOPHAGOGASTRODUODENOSCOPY (EGD) WITH PROPOFOL;  Surgeon: Jonathon Bellows, MD;  Location: [facility] ENDOSCOPY;  Service: Endoscopy;  Laterality: N/A;  . FOOT SURGERY Bilateral   . INSERTION OF MESH N/A 12/26/2017   Procedure: INSERTION OF MESH;  Surgeon: Jules Husbands, MD;  Location: [facility] ORS;  Service: General;  Laterality: N/A;  . JOINT REPLACEMENT Right    Total Knee replacement X 2  . JOINT REPLACEMENT Bilateral    Total Hip Replacement  . knee arthroscopo Right   . LAPAROSCOPY  09/22/2014   Procedure: LAPAROSCOPY OPERATIVE;  Surgeon: Brayton Mars, MD;  Location: [facility] ORS;  Service: Gynecology;;  excision and fulgeration of endomertriosis  .  LIPOMA EXCISION    . ROBOTIC ASSISTED LAPAROSCOPIC VENTRAL/INCISIONAL HERNIA REPAIR N/A 12/26/2017   Procedure: ROBOTIC ASSISTED LAPAROSCOPIC VENTRAL/INCISIONAL HERNIA REPAIR;  Surgeon: Jules Husbands, MD;  Location: [facility] ORS;  Service: General;  Laterality: N/A;  . TONSILLECTOMY    . TOTAL HIP ARTHROPLASTY Left 12/16/2014   Procedure: TOTAL HIP ARTHROPLASTY ANTERIOR APPROACH;  Surgeon: Hessie Knows, MD;  Location: [facility] ORS;  Service: Orthopedics;  Laterality: Left;  . TOTAL HIP ARTHROPLASTY Right 05/12/2015   Procedure:  TOTAL HIP ARTHROPLASTY ANTERIOR APPROACH;  Surgeon: Hessie Knows, MD;  Location: [facility];  Service: Orthopedics;  Laterality: Right;  . TOTAL KNEE REVISION Right 09/22/2015   Procedure: TOTAL KNEE REVISION/ REVISE POLYIETHYLENE;  Surgeon: Hessie Knows, MD;  Location: [facility];  Service: Orthopedics;  Laterality: Right;  . TOTAL KNEE REVISION Right 10/11/2016   Procedure: TOTAL KNEE REVISION;  Surgeon: Hessie Knows, MD;  Location: [facility] ORS;  Service: Orthopedics;  Laterality: Right;  . TUBAL LIGATION      Family History  Problem Relation Age of Onset  . Diabetes Mother   . Heart disease Father   . Heart attack Father   . Diabetes Sister   . Breast cancer Maternal Aunt        <50  . Breast cancer Paternal Aunt        x2.  <50  . Healthy Son   . Drug abuse Sister     Social History:  reports that she has been smoking cigarettes. She started smoking about 38 years ago. She has a 18.50 pack-year smoking history. She has never used smokeless tobacco. She reports that she does not drink alcohol or use drugs.  Allergies:  Allergies  Allergen Reactions  . Codeine Nausea Only  . Imitrex [Sumatriptan] Other (See Comments)    Chest pain    Medications reviewed.    ROS Full ROS performed and is otherwise negative other than what is stated in HPI   BP 130/87   Pulse 92   Temp 97.7 F (36.5 C) (Temporal)   Ht 5\' 4"  (1.626 m)   Wt 253 lb 6.4 oz (114.9 kg)   LMP 09/26/2014 Comment: Total  SpO2 98%   BMI 43.50 kg/m   Physical Exam Vitals signs and nursing note reviewed. Exam conducted with a chaperone present.  Constitutional:      Appearance: Normal appearance. She is obese.  Eyes:     General: No scleral icterus.       Right eye: No discharge.        Left eye: No discharge.  Neck:     Musculoskeletal: Normal range of motion and neck supple.  Cardiovascular:     Rate and Rhythm: Normal rate.     Pulses: Normal pulses.  Pulmonary:     Effort: Pulmonary effort is normal.  No respiratory distress.     Breath sounds: Normal breath sounds.  Abdominal:     General: Abdomen is flat. There is no distension.     Tenderness: There is abdominal tenderness. There is no rebound.     Hernia: A hernia is present.     Comments: Left upper quadrant ventral hernia measuring approximately 3 cm it is significantly tender but reduces easily  Skin:    General: Skin is warm and dry.     Capillary Refill: Capillary refill takes less than 2 seconds.  Neurological:     General: No focal deficit present.     Mental Status: She is oriented to person, place,  and time.  Psychiatric:        Mood and Affect: Mood normal.        Behavior: Behavior normal.        Thought Content: Thought content normal.        Judgment: Judgment normal.         Assessment/Plan: 53 year old female with severe pain associated to a left Spigelian vs incisional ventral hernia.  He is morbidly obese and she smokes and she does have significant psychiatric history.  Discussed with her in detail about the ideal circumstances of risk factor modification.  I firmly believe that this is as good as we are going to get from her medical perspective.  Is having daily severe symptoms that interferes with her quality of life and I suspect that she is having also intermittent episodes of incarceration. That reason I do recommend proceeding to the operating room for an open ventral hernia repair.  She understands that she has significant risk factors that may contribute to a possible complication of her surgery.  I have discussed at length with her my thought process.  The surgery itself the risk of bleeding, infection, chronic pain.  She understands and wishes to proceed.  Greater than 50% of the 25 minutes  visit was spent in counseling/coordination of care   Caroleen Hamman, MD [place] Surgeon

## 2018-11-05 ENCOUNTER — Other Ambulatory Visit: Payer: Self-pay

## 2018-11-05 ENCOUNTER — Encounter
Admission: RE | Admit: 2018-11-05 | Discharge: 2018-11-05 | Disposition: A | Discharge status: Home / Self Care | Payer: Medicare Other | Source: Ambulatory Visit | Attending: Surgery | Admitting: Surgery

## 2018-11-05 DIAGNOSIS — K219 Gastro-esophageal reflux disease without esophagitis: Secondary | ICD-10-CM | POA: Diagnosis not present

## 2018-11-05 DIAGNOSIS — K5909 Other constipation: Secondary | ICD-10-CM | POA: Diagnosis not present

## 2018-11-05 DIAGNOSIS — Z1159 Encounter for screening for other viral diseases: Secondary | ICD-10-CM | POA: Diagnosis not present

## 2018-11-05 DIAGNOSIS — G4730 Sleep apnea, unspecified: Secondary | ICD-10-CM | POA: Diagnosis not present

## 2018-11-05 DIAGNOSIS — J449 Chronic obstructive pulmonary disease, unspecified: Secondary | ICD-10-CM | POA: Diagnosis not present

## 2018-11-05 DIAGNOSIS — E785 Hyperlipidemia, unspecified: Secondary | ICD-10-CM | POA: Diagnosis not present

## 2018-11-05 DIAGNOSIS — M1990 Unspecified osteoarthritis, unspecified site: Secondary | ICD-10-CM | POA: Diagnosis not present

## 2018-11-05 DIAGNOSIS — M5136 Other intervertebral disc degeneration, lumbar region: Secondary | ICD-10-CM | POA: Diagnosis not present

## 2018-11-05 DIAGNOSIS — F419 Anxiety disorder, unspecified: Secondary | ICD-10-CM | POA: Diagnosis not present

## 2018-11-05 DIAGNOSIS — Z79899 Other long term (current) drug therapy: Secondary | ICD-10-CM | POA: Diagnosis not present

## 2018-11-05 DIAGNOSIS — F17210 Nicotine dependence, cigarettes, uncomplicated: Secondary | ICD-10-CM | POA: Diagnosis not present

## 2018-11-05 DIAGNOSIS — G43909 Migraine, unspecified, not intractable, without status migrainosus: Secondary | ICD-10-CM | POA: Diagnosis not present

## 2018-11-05 DIAGNOSIS — G894 Chronic pain syndrome: Secondary | ICD-10-CM | POA: Diagnosis not present

## 2018-11-05 DIAGNOSIS — H9190 Unspecified hearing loss, unspecified ear: Secondary | ICD-10-CM | POA: Diagnosis not present

## 2018-11-05 DIAGNOSIS — Z6841 Body Mass Index (BMI) 40.0 and over, adult: Secondary | ICD-10-CM | POA: Diagnosis not present

## 2018-11-05 DIAGNOSIS — E119 Type 2 diabetes mellitus without complications: Secondary | ICD-10-CM | POA: Diagnosis not present

## 2018-11-05 DIAGNOSIS — F319 Bipolar disorder, unspecified: Secondary | ICD-10-CM | POA: Diagnosis not present

## 2018-11-05 DIAGNOSIS — K439 Ventral hernia without obstruction or gangrene: Secondary | ICD-10-CM | POA: Diagnosis not present

## 2018-11-05 DIAGNOSIS — I10 Essential (primary) hypertension: Secondary | ICD-10-CM | POA: Diagnosis not present

## 2018-11-05 NOTE — Patient Instructions (Signed)
Your procedure is scheduled on: Thursday November 08, 2018 Report to Day Surgery on the 2nd floor of the [facility]. To find out your arrival time, please call [phone] between 1PM - 3PM on: Wednesday November 07, 2018  REMEMBER: Instructions that are not followed completely may result in serious medical risk, up to and including death; or upon the discretion of your surgeon and anesthesiologist your surgery may need to be rescheduled.  Do not eat food after midnight the night before surgery.  No gum chewing, lozengers or hard candies.  You may however, drink CLEAR liquids up to 2 hours before you are scheduled to arrive for your surgery. Do not drink anything within 2 hours of the start of your surgery.  Clear liquids include: - water  - apple juice without pulp - gatorade - black coffee or tea (Do NOT add milk or creamers to the coffee or tea) Do NOT drink anything that is not on this list.  Type 1 and Type 2 diabetics should only drink water.    No Alcohol for 24 hours before or after surgery.  No Smoking including e-cigarettes for 24 hours prior to surgery.  No chewable tobacco products for at least 6 hours prior to surgery.  No nicotine patches on the day of surgery.  On the morning of surgery brush your teeth with toothpaste and water, you may rinse your mouth with mouthwash if you wish. Do not swallow any toothpaste or mouthwash.  Notify your doctor if there is any change in your medical condition (cold, fever, infection).  Do not wear jewelry, make-up, hairpins, clips or nail polish.  Do not wear lotions, powders, or perfumes.   Do not shave 48 hours prior to surgery.   Contacts and dentures may not be worn into surgery.  Do not bring valuables to the hospital, including drivers license, insurance or credit cards.  [facility] is not responsible for any belongings or valuables.   TAKE THESE MEDICATIONS THE MORNING OF  SURGERY: KLONIPIN LIPITOR LATUDA ZOLOFT BUSPAR DEXILANT OXYCODONE  Use CHG Soap  as directed on instruction sheet.  Use inhalers on the day of surgery AND NEBULIZER .  Stop Anti-inflammatories (NSAIDS) such as Advil, Aleve, Ibuprofen, Motrin, Naproxen, Naprosyn and Aspirin based products such as Excedrin, Goodys Powder, BC Powder. (May take Tylenol or Acetaminophen if needed.)  Stop ANY OVER THE COUNTER supplements until after surgery. STOP OMEGA 3 FISH OIL (May continue Vitamin D, Vitamin B, and multivitamin.)  Wear comfortable clothing (specific to your surgery type) to the hospital.  Plan for stool softeners for home use.  If you are being discharged the day of surgery, you will not be allowed to drive home. You will need a responsible adult to drive you home and stay with you that night.   If you are taking public transportation, you will need to have a responsible adult with you. Please confirm with your physician that it is acceptable to use public transportation.   Please call [phone] if you have any questions about these instructions.

## 2018-11-06 ENCOUNTER — Ambulatory Visit: Payer: Self-pay | Admitting: Pharmacist

## 2018-11-06 DIAGNOSIS — E785 Hyperlipidemia, unspecified: Secondary | ICD-10-CM

## 2018-11-06 LAB — NOVEL CORONAVIRUS, NAA (HOSP ORDER, SEND-OUT TO REF LAB; TAT 18-24 HRS): SARS-CoV-2, NAA: NOT DETECTED

## 2018-11-06 NOTE — Chronic Care Management (AMB) (Signed)
  Chronic Care Management   Note  11/06/2018 Name: Wendy Johnson MRN: [id number] DOB: 06-20-1965  Successful outreach to Wendy Johnson, 53 year old patient of Dr. Steele Sizer. CCM clinical pharmacist consulted via internal referral from [facility]. HIPAA identifiers verified.  Purpose of call was to review proper use, administration, and adherence of atorvastatin and address patient's hyperlipidemia. Patient declined clinical pharmacist services at this time.   Follow up plan: The patient has been provided with contact information for the care management team and has been advised to call with any health related questions or concerns.   Wendy Johnson, PharmD Clinical Pharmacist [facility] [phone]

## 2018-11-07 ENCOUNTER — Ambulatory Visit: Payer: Self-pay

## 2018-11-07 ENCOUNTER — Other Ambulatory Visit: Payer: Self-pay

## 2018-11-07 DIAGNOSIS — R1902 Left upper quadrant abdominal swelling, mass and lump: Secondary | ICD-10-CM

## 2018-11-07 MED ORDER — CEFAZOLIN SODIUM-DEXTROSE 2-4 GM/100ML-% IV SOLN
2.0000 g | INTRAVENOUS | Status: AC
  Administered 2018-11-08: 2 g via INTRAVENOUS

## 2018-11-07 NOTE — Chronic Care Management (AMB) (Signed)
  Chronic Care Management   Follow Up Note   11/07/2018 Name: Wendy Johnson MRN: [id number] DOB: Jan 09, 1966  Referred by: Steele Sizer, MD Reason for referral : Chronic Care Management (follow up)   Subjective: "I am having my hernia repaired tomorrow but I will be coming home afterwards"   Objective:  Assessment:   Wendy Johnson a 53year old femalewho is a primary care patient of Steele Sizer, MD. Wendy Johnson was referred to the CCM Team by her health plan. She has a history of but not limited to Moderate COPD, Dyslipidemia, Metabolic Syndrome, bipolar disorder, and osteoarthritis involving multiple joints. 10/12/2018 CCM RN CM completed a telephone assessment of patients health and discussed health goals. Today I follow up with Wendy Johnson to assess for needs prior to scheduled out patient hernia repair.   Review of patient status, including review of consultants reports, relevant laboratory and other test results, and collaboration with appropriate care team members and the patient's provider was performed as part of comprehensive patient evaluation and provision of chronic care management services.    Goals Addressed            This Visit's Progress   . I am having hernia surgery tomorrow (pt-stated)       Wendy Johnson admits to scheduled outpatient hernia surgery tomorrow. She denies any questions or concerns regarding her surgery. She lives with her boyfriend who will be her caregiver however he does continue to work and she will be left home alone at times. She states she has friends that can assist but unsure if they have plans for the holiday weekend.  Current Barriers:  . Lacks caregiver support.  . Film/video editor.   Nurse Case Manager Clinical Goal(s):  Marland Kitchen Over the next 7 days, patient will verbalize understanding of plan for hernia surgery recovery  Interventions:  . Provided emotional support and reassurance . Reviewed surgical pre-op notes . Assessed  for support at home during surgery recovery  Patient Self Care Activities:  . Self administers medications as prescribed . Attends all scheduled provider appointments . Calls pharmacy for medication refills . Attends church or other social activities . Performs ADL's independently . Performs IADL's independently . Calls provider office for new concerns or questions  Initial goal documentation         Telephone follow up appointment with care management team member scheduled for: Monday 11/12/2018    Wendy Johnson E. Rollene Rotunda, RN, BSN Nurse Care Coordinator [facility] / [facility] Care Management  [phone]

## 2018-11-07 NOTE — Patient Instructions (Signed)
  Thank you allowing the Chronic Care Management Team to be a part of your care! It was a pleasure speaking with you today!  1. Follow your post surgical discharge instructions. If you have any questions or concerns please contact me for assistance. I will follow up with you on Monday.  CCM (Chronic Care Management) Team   Trish Fountain RN, BSN Nurse Care Coordinator  [phone]  Ruben Reason PharmD  Clinical Pharmacist  [phone]   Elliot Gurney, LCSW Clinical Social Worker [phone]  Goals Addressed            This Visit's Progress   . I am having hernia surgery tomorrow (pt-stated)       Current Barriers:  . Lacks caregiver support.  . Film/video editor.   Nurse Case Manager Clinical Goal(s):  Marland Kitchen Over the next 7 days, patient will verbalize understanding of plan for hernia surgery recovery  Interventions:  . Provided emotional support and reassurance . Reviewed surgical pre-op notes . Assessed for support at home during surgery recovery  Patient Self Care Activities:  . Self administers medications as prescribed . Attends all scheduled provider appointments . Calls pharmacy for medication refills . Attends church or other social activities . Performs ADL's independently . Performs IADL's independently . Calls provider office for new concerns or questions  Initial goal documentation        The patient verbalized understanding of instructions provided today and declined a print copy of patient instruction materials.   Telephone follow up appointment with care management team member scheduled for: 11/12/2018  SYMPTOMS OF A STROKE   You have any symptoms of stroke. "BE FAST" is an easy way to remember the main warning signs: ? B - Balance. Signs are dizziness, sudden trouble walking, or loss of balance. ? E - Eyes. Signs are trouble seeing or a sudden change in how you see. ? F - Face. Signs are sudden weakness or loss of feeling of the  face, or the face or eyelid drooping on one side. ? A - Arms. Signs are weakness or loss of feeling in an arm. This happens suddenly and usually on one side of the body. ? S - Speech. Signs are sudden trouble speaking, slurred speech, or trouble understanding what people say. ? T - Time. Time to call emergency services. Write down what time symptoms started.  You have other signs of stroke, such as: ? A sudden, very bad headache with no known cause. ? Feeling sick to your stomach (nausea). ? Throwing up (vomiting). ? Jerky movements you cannot control (seizure).  SYMPTOMS OF A HEART ATTACK  What are the signs or symptoms? Symptoms of this condition include:  Chest pain. It may feel like: ? Crushing or squeezing. ? Tightness, pressure, fullness, or heaviness.  Pain in the arm, neck, jaw, back, or upper body.  Shortness of breath.  Heartburn.  Indigestion.  Nausea.  Cold sweats.  Feeling tired.  Sudden lightheadedness.

## 2018-11-08 ENCOUNTER — Ambulatory Visit: Payer: Medicare Other | Admitting: Certified Registered"

## 2018-11-08 ENCOUNTER — Encounter: Payer: Self-pay | Admitting: Anesthesiology

## 2018-11-08 ENCOUNTER — Ambulatory Visit
Admission: RE | Admit: 2018-11-08 | Discharge: 2018-11-08 | Disposition: A | Discharge status: Home / Self Care | Payer: Medicare Other | Attending: Surgery | Admitting: Surgery

## 2018-11-08 ENCOUNTER — Encounter
Admission: RE | Disposition: A | Discharge status: Home / Self Care | Payer: Self-pay | Source: Home / Self Care | Attending: Surgery

## 2018-11-08 DIAGNOSIS — F17210 Nicotine dependence, cigarettes, uncomplicated: Secondary | ICD-10-CM | POA: Insufficient documentation

## 2018-11-08 DIAGNOSIS — M5136 Other intervertebral disc degeneration, lumbar region: Secondary | ICD-10-CM | POA: Diagnosis not present

## 2018-11-08 DIAGNOSIS — K439 Ventral hernia without obstruction or gangrene: Secondary | ICD-10-CM | POA: Insufficient documentation

## 2018-11-08 DIAGNOSIS — Z6841 Body Mass Index (BMI) 40.0 and over, adult: Secondary | ICD-10-CM | POA: Insufficient documentation

## 2018-11-08 DIAGNOSIS — G43909 Migraine, unspecified, not intractable, without status migrainosus: Secondary | ICD-10-CM | POA: Diagnosis not present

## 2018-11-08 DIAGNOSIS — K5909 Other constipation: Secondary | ICD-10-CM | POA: Diagnosis not present

## 2018-11-08 DIAGNOSIS — F319 Bipolar disorder, unspecified: Secondary | ICD-10-CM | POA: Insufficient documentation

## 2018-11-08 DIAGNOSIS — H9190 Unspecified hearing loss, unspecified ear: Secondary | ICD-10-CM | POA: Diagnosis not present

## 2018-11-08 DIAGNOSIS — Z1159 Encounter for screening for other viral diseases: Secondary | ICD-10-CM | POA: Diagnosis not present

## 2018-11-08 DIAGNOSIS — G4730 Sleep apnea, unspecified: Secondary | ICD-10-CM | POA: Insufficient documentation

## 2018-11-08 DIAGNOSIS — G894 Chronic pain syndrome: Secondary | ICD-10-CM | POA: Diagnosis not present

## 2018-11-08 DIAGNOSIS — F419 Anxiety disorder, unspecified: Secondary | ICD-10-CM | POA: Insufficient documentation

## 2018-11-08 DIAGNOSIS — E785 Hyperlipidemia, unspecified: Secondary | ICD-10-CM | POA: Insufficient documentation

## 2018-11-08 DIAGNOSIS — K219 Gastro-esophageal reflux disease without esophagitis: Secondary | ICD-10-CM | POA: Insufficient documentation

## 2018-11-08 DIAGNOSIS — J449 Chronic obstructive pulmonary disease, unspecified: Secondary | ICD-10-CM | POA: Insufficient documentation

## 2018-11-08 DIAGNOSIS — E119 Type 2 diabetes mellitus without complications: Secondary | ICD-10-CM | POA: Diagnosis not present

## 2018-11-08 DIAGNOSIS — Z79899 Other long term (current) drug therapy: Secondary | ICD-10-CM | POA: Diagnosis not present

## 2018-11-08 DIAGNOSIS — I10 Essential (primary) hypertension: Secondary | ICD-10-CM | POA: Diagnosis not present

## 2018-11-08 DIAGNOSIS — M1990 Unspecified osteoarthritis, unspecified site: Secondary | ICD-10-CM | POA: Diagnosis not present

## 2018-11-08 DIAGNOSIS — K432 Incisional hernia without obstruction or gangrene: Secondary | ICD-10-CM | POA: Diagnosis not present

## 2018-11-08 HISTORY — PX: VENTRAL HERNIA REPAIR: SHX424

## 2018-11-08 LAB — URINE DRUG SCREEN, QUALITATIVE (ARMC ONLY)
Amphetamines, Ur Screen: POSITIVE — AB
Barbiturates, Ur Screen: NOT DETECTED
Benzodiazepine, Ur Scrn: NOT DETECTED
Cannabinoid 50 Ng, Ur ~~LOC~~: NOT DETECTED
Cocaine Metabolite,Ur ~~LOC~~: NOT DETECTED
MDMA (Ecstasy)Ur Screen: NOT DETECTED
Methadone Scn, Ur: NOT DETECTED
Opiate, Ur Screen: POSITIVE — AB
Phencyclidine (PCP) Ur S: NOT DETECTED
Tricyclic, Ur Screen: NOT DETECTED

## 2018-11-08 SURGERY — REPAIR, HERNIA, VENTRAL
Anesthesia: General

## 2018-11-08 MED ORDER — DEXAMETHASONE SODIUM PHOSPHATE 10 MG/ML IJ SOLN
INTRAMUSCULAR | Status: AC
  Filled 2018-11-08: qty 1

## 2018-11-08 MED ORDER — ACETAMINOPHEN 500 MG PO TABS
1000.0000 mg | ORAL_TABLET | ORAL | Status: AC
  Administered 2018-11-08: 1000 mg via ORAL

## 2018-11-08 MED ORDER — SUCCINYLCHOLINE CHLORIDE 20 MG/ML IJ SOLN
INTRAMUSCULAR | Status: DC | PRN
  Administered 2018-11-08: 90 mg via INTRAVENOUS

## 2018-11-08 MED ORDER — BUPIVACAINE-EPINEPHRINE (PF) 0.25% -1:200000 IJ SOLN
INTRAMUSCULAR | Status: AC
  Filled 2018-11-08: qty 30

## 2018-11-08 MED ORDER — SUGAMMADEX SODIUM 200 MG/2ML IV SOLN
INTRAVENOUS | Status: AC
  Filled 2018-11-08: qty 4

## 2018-11-08 MED ORDER — BUPIVACAINE-EPINEPHRINE (PF) 0.25% -1:200000 IJ SOLN
INTRAMUSCULAR | Status: DC | PRN
  Administered 2018-11-08: 30 mL

## 2018-11-08 MED ORDER — ONDANSETRON HCL 4 MG/2ML IJ SOLN
INTRAMUSCULAR | Status: DC | PRN
  Administered 2018-11-08: 4 mg via INTRAVENOUS

## 2018-11-08 MED ORDER — FENTANYL CITRATE (PF) 100 MCG/2ML IJ SOLN
INTRAMUSCULAR | Status: DC | PRN
  Administered 2018-11-08 (×5): 50 ug via INTRAVENOUS

## 2018-11-08 MED ORDER — LACTATED RINGERS IV SOLN
INTRAVENOUS | Status: DC
  Administered 2018-11-08 (×2): via INTRAVENOUS

## 2018-11-08 MED ORDER — ACETAMINOPHEN 500 MG PO TABS
ORAL_TABLET | ORAL | Status: AC
  Filled 2018-11-08: qty 2

## 2018-11-08 MED ORDER — BUPIVACAINE LIPOSOME 1.3 % IJ SUSP
INTRAMUSCULAR | Status: AC
  Filled 2018-11-08: qty 20

## 2018-11-08 MED ORDER — CELECOXIB 200 MG PO CAPS
ORAL_CAPSULE | ORAL | Status: AC
  Filled 2018-11-08: qty 1

## 2018-11-08 MED ORDER — GABAPENTIN 300 MG PO CAPS
300.0000 mg | ORAL_CAPSULE | ORAL | Status: AC
  Administered 2018-11-08: 300 mg via ORAL

## 2018-11-08 MED ORDER — CHLORHEXIDINE GLUCONATE CLOTH 2 % EX PADS: 6.0000 | MEDICATED_PAD | Freq: Once | CUTANEOUS | Status: DC

## 2018-11-08 MED ORDER — CELECOXIB 200 MG PO CAPS
200.0000 mg | ORAL_CAPSULE | ORAL | Status: AC
  Administered 2018-11-08: 200 mg via ORAL

## 2018-11-08 MED ORDER — ONDANSETRON HCL 4 MG/2ML IJ SOLN
INTRAMUSCULAR | Status: AC
  Filled 2018-11-08: qty 2

## 2018-11-08 MED ORDER — SUCCINYLCHOLINE CHLORIDE 20 MG/ML IJ SOLN
INTRAMUSCULAR | Status: AC
  Filled 2018-11-08: qty 1

## 2018-11-08 MED ORDER — MIDAZOLAM HCL 2 MG/2ML IJ SOLN
INTRAMUSCULAR | Status: AC
  Filled 2018-11-08: qty 2

## 2018-11-08 MED ORDER — ROCURONIUM BROMIDE 50 MG/5ML IV SOLN
INTRAVENOUS | Status: AC
  Filled 2018-11-08: qty 1

## 2018-11-08 MED ORDER — DEXAMETHASONE SODIUM PHOSPHATE 10 MG/ML IJ SOLN
INTRAMUSCULAR | Status: DC | PRN
  Administered 2018-11-08: 10 mg via INTRAVENOUS

## 2018-11-08 MED ORDER — CEFAZOLIN SODIUM-DEXTROSE 2-4 GM/100ML-% IV SOLN
INTRAVENOUS | Status: AC
  Filled 2018-11-08: qty 100

## 2018-11-08 MED ORDER — FENTANYL CITRATE (PF) 100 MCG/2ML IJ SOLN
25.0000 ug | INTRAMUSCULAR | Status: DC | PRN
  Administered 2018-11-08 (×4): 25 ug via INTRAVENOUS

## 2018-11-08 MED ORDER — FENTANYL CITRATE (PF) 250 MCG/5ML IJ SOLN
INTRAMUSCULAR | Status: AC
  Filled 2018-11-08: qty 5

## 2018-11-08 MED ORDER — SUGAMMADEX SODIUM 200 MG/2ML IV SOLN
INTRAVENOUS | Status: DC | PRN
  Administered 2018-11-08: 300 mg via INTRAVENOUS

## 2018-11-08 MED ORDER — ONDANSETRON HCL 4 MG/2ML IJ SOLN: 4.0000 mg | Freq: Once | INTRAMUSCULAR | Status: DC | PRN

## 2018-11-08 MED ORDER — LIDOCAINE HCL (CARDIAC) PF 100 MG/5ML IV SOSY
PREFILLED_SYRINGE | INTRAVENOUS | Status: DC | PRN
  Administered 2018-11-08: 100 mg via INTRAVENOUS

## 2018-11-08 MED ORDER — OXYCODONE-ACETAMINOPHEN 5-325 MG PO TABS: 1.0000 | ORAL_TABLET | ORAL | 0 refills | Status: AC | PRN

## 2018-11-08 MED ORDER — GABAPENTIN 300 MG PO CAPS
ORAL_CAPSULE | ORAL | Status: AC
  Filled 2018-11-08: qty 1

## 2018-11-08 MED ORDER — PROPOFOL 10 MG/ML IV BOLUS
INTRAVENOUS | Status: AC
  Filled 2018-11-08: qty 40

## 2018-11-08 MED ORDER — LIDOCAINE HCL (PF) 2 % IJ SOLN
INTRAMUSCULAR | Status: AC
  Filled 2018-11-08: qty 10

## 2018-11-08 MED ORDER — MIDAZOLAM HCL 2 MG/2ML IJ SOLN
INTRAMUSCULAR | Status: DC | PRN
  Administered 2018-11-08: 2 mg via INTRAVENOUS

## 2018-11-08 MED ORDER — PROPOFOL 10 MG/ML IV BOLUS
INTRAVENOUS | Status: DC | PRN
  Administered 2018-11-08: 130 mg via INTRAVENOUS

## 2018-11-08 MED ORDER — FENTANYL CITRATE (PF) 100 MCG/2ML IJ SOLN
INTRAMUSCULAR | Status: AC
  Administered 2018-11-08: 25 ug via INTRAVENOUS
  Filled 2018-11-08: qty 2

## 2018-11-08 MED ORDER — HYDROMORPHONE HCL 1 MG/ML IJ SOLN
INTRAMUSCULAR | Status: AC
  Administered 2018-11-08: 0.5 mg via INTRAVENOUS
  Filled 2018-11-08: qty 1

## 2018-11-08 MED ORDER — BUPIVACAINE LIPOSOME 1.3 % IJ SUSP
INTRAMUSCULAR | Status: DC | PRN
  Administered 2018-11-08: 20 mL

## 2018-11-08 MED ORDER — BUPIVACAINE LIPOSOME 1.3 % IJ SUSP: 20.0000 mL | Freq: Once | INTRAMUSCULAR | Status: DC

## 2018-11-08 MED ORDER — DEXMEDETOMIDINE HCL IN NACL 200 MCG/50ML IV SOLN
INTRAVENOUS | Status: DC | PRN
  Administered 2018-11-08: 8 ug via INTRAVENOUS

## 2018-11-08 MED ORDER — PHENYLEPHRINE HCL (PRESSORS) 10 MG/ML IV SOLN
INTRAVENOUS | Status: DC | PRN
  Administered 2018-11-08: 50 ug via INTRAVENOUS
  Administered 2018-11-08: 100 ug via INTRAVENOUS
  Administered 2018-11-08: 50 ug via INTRAVENOUS

## 2018-11-08 MED ORDER — HYDROMORPHONE HCL 1 MG/ML IJ SOLN
0.5000 mg | INTRAMUSCULAR | Status: AC | PRN
  Administered 2018-11-08 (×4): 0.5 mg via INTRAVENOUS

## 2018-11-08 MED ORDER — ROCURONIUM BROMIDE 100 MG/10ML IV SOLN
INTRAVENOUS | Status: DC | PRN
  Administered 2018-11-08: 10 mg via INTRAVENOUS
  Administered 2018-11-08: 40 mg via INTRAVENOUS
  Administered 2018-11-08: 20 mg via INTRAVENOUS
  Administered 2018-11-08: 10 mg via INTRAVENOUS

## 2018-11-08 SURGICAL SUPPLY — 36 items
APPLIER CLIP 11 MED OPEN (CLIP)
APPLIER CLIP 13 LRG OPEN (CLIP)
BLADE CLIPPER SURG (BLADE) ×1 IMPLANT
CANISTER SUCT 3000ML PPV (MISCELLANEOUS) ×2 IMPLANT
CHLORAPREP W/TINT 26 (MISCELLANEOUS) ×2 IMPLANT
CLIP APPLIE 11 MED OPEN (CLIP) ×1 IMPLANT
CLIP APPLIE 13 LRG OPEN (CLIP) ×1 IMPLANT
COVER WAND RF STERILE (DRAPES) ×2 IMPLANT
DRAPE LAPAROTOMY 100X77 ABD (DRAPES) ×2 IMPLANT
DRSG TELFA 3X8 NADH (GAUZE/BANDAGES/DRESSINGS) IMPLANT
ELECT CAUTERY BLADE 6.4 (BLADE) ×2 IMPLANT
ELECT REM PT RETURN 9FT ADLT (ELECTROSURGICAL) ×2
ELECTRODE REM PT RTRN 9FT ADLT (ELECTROSURGICAL) ×1 IMPLANT
GAUZE SPONGE 4X4 12PLY STRL (GAUZE/BANDAGES/DRESSINGS) ×1 IMPLANT
GLOVE BIO SURGEON STRL SZ7 (GLOVE) ×2 IMPLANT
GOWN STRL REUS W/ TWL LRG LVL3 (GOWN DISPOSABLE) ×2 IMPLANT
GOWN STRL REUS W/TWL LRG LVL3 (GOWN DISPOSABLE) ×2
MESH VENTRALEX ST 8CM LRG (Mesh General) ×1 IMPLANT
NDL HYPO 25X1 1.5 SAFETY (NEEDLE) ×1 IMPLANT
NEEDLE HYPO 22GX1.5 SAFETY (NEEDLE) ×2 IMPLANT
NEEDLE HYPO 25X1 1.5 SAFETY (NEEDLE) ×2 IMPLANT
PACK BASIN MINOR ARMC (MISCELLANEOUS) ×2 IMPLANT
PAD DRESSING TELFA 3X8 NADH (GAUZE/BANDAGES/DRESSINGS) ×1 IMPLANT
SPONGE LAP 18X18 RF (DISPOSABLE) ×3 IMPLANT
STAPLER SKIN PROX 35W (STAPLE) ×1 IMPLANT
SUT ETHIBOND 0 MO6 C/R (SUTURE) ×4 IMPLANT
SUT ETHIBOND NAB MO 7 #0 18IN (SUTURE) ×1 IMPLANT
SUT MNCRL 4-0 (SUTURE) ×1
SUT MNCRL 4-0 27XMFL (SUTURE) ×1
SUT PDS AB 0 CT1 27 (SUTURE) ×4 IMPLANT
SUT VIC AB 2-0 SH 27 (SUTURE) ×2
SUT VIC AB 2-0 SH 27XBRD (SUTURE) ×2 IMPLANT
SUTURE MNCRL 4-0 27XMF (SUTURE) IMPLANT
SYR 20CC LL (SYRINGE) ×2 IMPLANT
TAPE MICROFOAM 4IN (TAPE) ×1 IMPLANT
WATER STERILE IRR 1000ML POUR (IV SOLUTION) ×1 IMPLANT

## 2018-11-08 NOTE — Anesthesia Post-op Follow-up Note (Signed)
Anesthesia QCDR form completed.        

## 2018-11-08 NOTE — Transfer of Care (Signed)
Immediate Anesthesia Transfer of Care Note  Patient: Kripa Foskey [facility]  Procedure(s) Performed: HERNIA REPAIR VENTRAL ADULT OPEN. DIABETIC, SLEEP APNEA (N/A )  Patient Location: PACU  Anesthesia Type:General  Level of Consciousness: awake and patient cooperative  Airway & Oxygen Therapy: Patient Spontanous Breathing and Patient connected to face mask oxygen  Post-op Assessment: Report given to RN and Post -op Vital signs reviewed and stable  Post vital signs: stable  Last Vitals:  Vitals Value Taken Time  BP 117/79 11/08/18 1437  Temp 36.7 C 11/08/18 1437  Pulse 83 11/08/18 1441  Resp 17 11/08/18 1441  SpO2 99 % 11/08/18 1441  Vitals shown include unvalidated device data.  Last Pain:  Vitals:   11/08/18 1437  PainSc: 0-No pain         Complications: No apparent anesthesia complications

## 2018-11-08 NOTE — Anesthesia Procedure Notes (Signed)
Procedure Name: Intubation Date/Time: 11/08/2018 1:01 PM Performed by: Lavone Orn, CRNA Pre-anesthesia Checklist: Patient identified, Emergency Drugs available, Suction available, Patient being monitored and Timeout performed Patient Re-evaluated:Patient Re-evaluated prior to induction Oxygen Delivery Method: Circle system utilized Preoxygenation: Pre-oxygenation with 100% oxygen Induction Type: IV induction and Cricoid Pressure applied Ventilation: Mask ventilation without difficulty Laryngoscope Size: Mac and 4 Tube type: Oral Tube size: 7.0 mm Number of attempts: 1 Airway Equipment and Method: Stylet Placement Confirmation: ETT inserted through vocal cords under direct vision,  positive ETCO2 and breath sounds checked- equal and bilateral Secured at: 22 cm Tube secured with: Tape Dental Injury: Teeth and Oropharynx as per pre-operative assessment

## 2018-11-08 NOTE — Interval H&P Note (Signed)
History and Physical Interval Note:  11/08/2018 12:19 PM  Wendy Johnson  has presented today for surgery, with the diagnosis of K43.9 VENTRAL HERNIA.  The various methods of treatment have been discussed with the patient and family. After consideration of risks, benefits and other options for treatment, the patient has consented to  Procedure(s): HERNIA REPAIR VENTRAL ADULT OPEN. DIABETIC, SLEEP APNEA (N/A) as a surgical intervention.  The patient's history has been reviewed, patient examined, no change in status, stable for surgery.  I have reviewed the patient's chart and labs.  Questions were answered to the patient's satisfaction.     [place]

## 2018-11-08 NOTE — Anesthesia Preprocedure Evaluation (Signed)
Anesthesia Evaluation  Patient identified by MRN, date of birth, ID band Patient awake    Reviewed: Allergy & Precautions, NPO status , Patient's Chart, lab work & pertinent test results, reviewed documented beta blocker date and time   Airway Mallampati: III  TM Distance: >3 FB     Dental  (+) Chipped   Pulmonary shortness of breath, sleep apnea , COPD, Current Smoker,           Cardiovascular hypertension, Pt. on medications      Neuro/Psych  Headaches, PSYCHIATRIC DISORDERS Anxiety Depression Bipolar Disorder  Neuromuscular disease    GI/Hepatic GERD  Controlled,  Endo/Other  diabetes, Type 2  Renal/GU      Musculoskeletal  (+) Arthritis ,   Abdominal   Peds  Hematology   Anesthesia Other Findings   Reproductive/Obstetrics                             Anesthesia Physical Anesthesia Plan  ASA: III  Anesthesia Plan: General   Post-op Pain Management:    Induction: Intravenous  PONV Risk Score and Plan:   Airway Management Planned: Oral ETT  Additional Equipment:   Intra-op Plan:   Post-operative Plan:   Informed Consent: I have reviewed the patients History and Physical, chart, labs and discussed the procedure including the risks, benefits and alternatives for the proposed anesthesia with the patient or authorized representative who has indicated his/her understanding and acceptance.       Plan Discussed with: CRNA  Anesthesia Plan Comments:         Anesthesia Quick Evaluation

## 2018-11-08 NOTE — Discharge Instructions (Addendum)
Open Hernia Repair, Adult, Care After These instructions give you information about caring for yourself after your procedure. Your doctor may also give you more specific instructions. If you have problems or questions, contact your doctor. Follow these instructions at home: Surgical cut (incision) care   Follow instructions from your doctor about how to take care of your surgical cut area. Make sure you: ? Wash your hands with soap and water before you change your bandage (dressing). If you cannot use soap and water, use hand sanitizer. ? Change your bandage as told by your doctor. ? Leave stitches (sutures), skin glue, or skin tape (adhesive) strips in place. They may need to stay in place for 2 weeks or longer. If tape strips get loose and curl up, you may trim the loose edges. Do not remove tape strips completely unless your doctor says it is okay.  Check your surgical cut every day for signs of infection. Check for: ? More redness, swelling, or pain. ? More fluid or blood. ? Warmth. ? Pus or a bad smell. Activity  Do not drive or use heavy machinery while taking prescription pain medicine. Do not drive until your doctor says it is okay.  Until your doctor says it is okay: ? Do not lift anything that is heavier than 10 lb (4.5 kg). ? Do not play contact sports.  Return to your normal activities as told by your doctor. Ask your doctor what activities are safe. General instructions  To prevent or treat having a hard time pooping (constipation) while you are taking prescription pain medicine, your doctor may recommend that you: ? Drink enough fluid to keep your pee (urine) clear or pale yellow. ? Take over-the-counter or prescription medicines. ? Eat foods that are high in fiber, such as fresh fruits and vegetables, whole grains, and beans. ? Limit foods that are high in fat and processed sugars, such as fried and sweet foods.  Take over-the-counter and prescription medicines only as  told by your doctor.  Do not take baths, swim, or use a hot tub until your doctor says it is okay.  Keep all follow-up visits as told by your doctor. This is important. Contact a doctor if:  You develop a rash.  You have more redness, swelling, or pain around your surgical cut.  You have more fluid or blood coming from your surgical cut.  Your surgical cut feels warm to the touch.  You have pus or a bad smell coming from your surgical cut.  You have a fever or chills.  You have blood in your poop (stool).  You have not pooped in 2-3 days.  Medicine does not help your pain. Get help right away if:  You have chest pain or you are short of breath.  You feel light-headed.  You feel weak and dizzy (feel faint).  You have very bad pain.  You throw up (vomit) and your pain is worse. This information is not intended to replace advice given to you by your health care provider. Make sure you discuss any questions you have with your health care provider. Document Released: 05/16/2014 Document Revised: 08/17/2018 Document Reviewed: 10/07/2015 Elsevier Patient Education  2020 [place]   1) The drugs that you were given will stay in your system until tomorrow so for the next 24 hours you should not:  A) Drive an automobile B) Make any legal decisions C) Drink any alcoholic beverage   2) You  may resume regular meals tomorrow.  Today it is better to start with liquids and gradually work up to solid foods.  You may eat anything you prefer, but it is better to start with liquids, then soup and crackers, and gradually work up to solid foods.   3) Please notify your doctor immediately if you have any unusual bleeding, trouble breathing, redness and pain at the surgery site, drainage, fever, or pain not relieved by medication.    4) Additional Instructions:        Please contact your physician with any problems or Same  Day Surgery at [phone], Monday through Friday 6 am to 4 pm, or [place] at [facility] number at [phone].

## 2018-11-08 NOTE — Op Note (Signed)
Ventral Hernia Repair with Mesh ventralight Round Patch 8cm  Pre-operative Diagnosis: Spigelian hernia  Post-operative Diagnosis: same  Surgeon: Caroleen Hamman, MD FACS  Anesthesia: Gen. with endotracheal tube  Findings: 3 cm UH  Estimated Blood Loss: 10cc                Specimens: none       Complications: none              Procedure Details  The patient was seen again in the Holding Room. The benefits, complications, treatment options, and expected outcomes were discussed with the patient. The risks of bleeding, infection, recurrence of symptoms, failure to resolve symptoms, bowel injury, mesh placement, mesh infection, any of which could require further surgery were reviewed with the patient. The likelihood of improving the patient's symptoms with return to their baseline status is good.  The patient and/or family concurred with the proposed plan, giving informed consent.  The patient was taken to Operating Room, identified and the procedure verified.  A Time Out was held and the above information confirmed.  Prior to the induction of general anesthesia, antibiotic prophylaxis was administered. VTE prophylaxis was in place. General endotracheal anesthesia was then administered and tolerated well. After the induction, the abdomen was prepped with Chloraprep and draped in the sterile fashion. The patient was positioned in the supine position.   Incision was created with a scalpel over the hernia defect. Electrocautery was used to dissect through subcutaneous tissue, the hernia sac was opened excised.  The hernia was measured. A  Ventralex Mesh 8cm round patch was selected and secure to the fascia in 4 quadrants with transfascial 0 ethibond sutures  ( underlay repair). Mesh layed flat against the abdominal wall I closed the hernia defect with interrupted 0 Ethibond sutures.   Incision was closed in a 2 layer fashion with 2-0 PDS and 4-0 Monocryl. Dermabond was used to coat the skin.  Liposomal  And Marcaine quarter percent with epinephrine and lidocaine 1% was used to inject all the incision sites. Patient tolerated procedure well and there were no immediate complications. Needle and laparotomy counts were correct

## 2018-11-08 NOTE — Anesthesia Postprocedure Evaluation (Signed)
Anesthesia Post Note  Patient: Wendy Johnson [facility]  Procedure(s) Performed: HERNIA REPAIR VENTRAL ADULT OPEN. DIABETIC, SLEEP APNEA (N/A )  Patient location during evaluation: PACU Anesthesia Type: General Level of consciousness: awake and alert Pain management: pain level controlled Vital Signs Assessment: post-procedure vital signs reviewed and stable Respiratory status: spontaneous breathing, nonlabored ventilation, respiratory function stable and patient connected to nasal cannula oxygen Cardiovascular status: blood pressure returned to baseline and stable Postop Assessment: no apparent nausea or vomiting Anesthetic complications: no     Last Vitals:  Vitals:   11/08/18 1539 11/08/18 1546  BP: 119/77   Pulse: (!) 59   Resp: 10   Temp:  (!) 36.3 C  SpO2: 94%     Last Pain:  Vitals:   11/08/18 1546  TempSrc: Temporal  PainSc: 2                  Wendy Johnson S

## 2018-11-11 ENCOUNTER — Encounter: Payer: Self-pay | Admitting: Surgery

## 2018-11-12 ENCOUNTER — Other Ambulatory Visit: Payer: Self-pay

## 2018-11-12 ENCOUNTER — Ambulatory Visit: Payer: Self-pay

## 2018-11-12 DIAGNOSIS — R1902 Left upper quadrant abdominal swelling, mass and lump: Secondary | ICD-10-CM

## 2018-11-12 NOTE — Chronic Care Management (AMB) (Signed)
  Chronic Care Management   Follow Up Note   11/12/2018 Name: Wendy Johnson MRN: [id number] DOB: [birth year]  Referred by: Wendy Sizer, MD Reason for referral : Chronic Care Management (follow up hernia surgery)   Subjective: "I am doing OK but I am in a lot of pain when I move"   Objective:  Assessment: Wendy Johnson a 53year old femalewho is a primary care patient of Wendy Sizer, MD. Wendy Johnson was referred to the CCM Team by her health plan. She has a history of but not limited to Moderate COPD, Dyslipidemia, Metabolic Syndrome, bipolar disorder, and osteoarthritis involving multiple joints.6/5/2020CCM RN CM completed a telephone assessment of patients health and discussed health goals. Today I follow up with Wendy Johnson to assess for needs related to her recent hernia repair 11/08/2018.  Review of patient status, including review of consultants reports, relevant laboratory and other test results, and collaboration with appropriate care team members and the patient's provider was performed as part of comprehensive patient evaluation and provision of chronic care management services.    Goals Addressed            This Visit's Progress   . COMPLETED: I am having hernia surgery tomorrow (pt-stated)       Wendy Johnson states she did very well with her outpatient hernia repair surgery. She was able to return home the same day and has had little pain until she ran out of her pain medications. This is not able to be refilled as she has taken 40 tabs in 3 days according to patient. She states her pain was such that she took two tablets every 4 hours round the clock to prevent the pain from worsening. Now unfortunately she has to deal with the mild to moderate pain and utilize tylenol as needed. She has help at home from her boyfriend. She is eating, drinking, and having BM. Denies s/s of infection at surgical site. She has a follow up appointment 11/21/2018 at 1:30 and her boyfriend  will provide transportation.    Current Barriers:  . Lacks caregiver support.  . Film/video editor.   Nurse Case Manager Clinical Goal(s):  Marland Kitchen Over the next 7 days, patient will verbalize understanding of plan for hernia surgery recovery  Interventions:  . Provided emotional support and reassurance . Reviewed surgical pre-op notes . Assessed for support at home during surgery recovery . Confirmed follow up appointment with surgeon 11/21/2018 at 1:30  Patient Self Care Activities:  . Self administers medications as prescribed . Attends all scheduled provider appointments . Calls pharmacy for medication refills . Attends church or other social activities . Performs ADL's independently . Performs IADL's independently . Calls provider office for new concerns or questions  Initial goal documentation         Telephone follow up appointment with care management team member scheduled for: 2 weeks    Winta Barcelo E. Rollene Rotunda, RN, BSN Nurse Care Coordinator [facility] / [facility] Care Management  [phone]

## 2018-11-12 NOTE — Patient Instructions (Signed)
  Thank you allowing the Chronic Care Management Team to be a part of your care! It was a pleasure speaking with you today!  1. Continue to take tylenol as needed for pain. 2. Follow your discharge instructions for activity level 3. Do not forget your post surgical follow up appointment 11/21/2018 at 1:30  CCM (Chronic Care Management) Team   Trish Fountain RN, BSN Nurse Care Coordinator  [phone]  Ruben Reason PharmD  Clinical Pharmacist  [phone]   [employer], LCSW Clinical Social Worker [phone]  Goals Addressed            This Visit's Progress   . COMPLETED: I am having hernia surgery tomorrow (pt-stated)       Current Barriers:  . Lacks caregiver support.  . Film/video editor.   Nurse Case Manager Clinical Goal(s):  Marland Kitchen Over the next 7 days, patient will verbalize understanding of plan for hernia surgery recovery  Interventions:  . Provided emotional support and reassurance . Reviewed surgical pre-op notes . Assessed for support at home during surgery recovery . Confirmed follow up appointment with surgeon 11/21/2018 at 1:30  Patient Self Care Activities:  . Self administers medications as prescribed . Attends all scheduled provider appointments . Calls pharmacy for medication refills . Attends church or other social activities . Performs ADL's independently . Performs IADL's independently . Calls provider office for new concerns or questions  Initial goal documentation        The patient verbalized understanding of instructions provided today and declined a print copy of patient instruction materials.   Telephone follow up appointment with care management team member scheduled for: 2 weeks  SYMPTOMS OF A STROKE   You have any symptoms of stroke. "BE FAST" is an easy way to remember the main warning signs: ? B - Balance. Signs are dizziness, sudden trouble walking, or loss of balance. ? E - Eyes. Signs are trouble seeing or a  sudden change in how you see. ? F - Face. Signs are sudden weakness or loss of feeling of the face, or the face or eyelid drooping on one side. ? A - Arms. Signs are weakness or loss of feeling in an arm. This happens suddenly and usually on one side of the body. ? S - Speech. Signs are sudden trouble speaking, slurred speech, or trouble understanding what people say. ? T - Time. Time to call emergency services. Write down what time symptoms started.  You have other signs of stroke, such as: ? A sudden, very bad headache with no known cause. ? Feeling sick to your stomach (nausea). ? Throwing up (vomiting). ? Jerky movements you cannot control (seizure).  SYMPTOMS OF A HEART ATTACK  What are the signs or symptoms? Symptoms of this condition include:  Chest pain. It may feel like: ? Crushing or squeezing. ? Tightness, pressure, fullness, or heaviness.  Pain in the arm, neck, jaw, back, or upper body.  Shortness of breath.  Heartburn.  Indigestion.  Nausea.  Cold sweats.  Feeling tired.  Sudden lightheadedness.

## 2018-11-13 ENCOUNTER — Telehealth: Payer: Self-pay

## 2018-11-13 ENCOUNTER — Encounter: Payer: Self-pay | Admitting: Surgery

## 2018-11-13 NOTE — Telephone Encounter (Signed)
Patient is calling and is almost out of her narcotic pain medication prescribed post operatively. She says that she is needing to take the maximum dosage and only has 3 pills left. I let her know that I would sent a message to Dr Dahlia Byes but that we do not normally give refills of this medication.
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
## 2018-11-14 ENCOUNTER — Other Ambulatory Visit: Payer: Self-pay

## 2018-11-14 ENCOUNTER — Telehealth: Payer: Self-pay | Admitting: *Deleted

## 2018-11-14 ENCOUNTER — Ambulatory Visit (INDEPENDENT_AMBULATORY_CARE_PROVIDER_SITE_OTHER): Payer: Self-pay | Admitting: Surgery

## 2018-11-14 ENCOUNTER — Encounter: Payer: Self-pay | Admitting: Surgery

## 2018-11-14 VITALS — BP 133/89 | HR 89 | Temp 97.9°F | Ht 65.0 in | Wt 247.4 lb

## 2018-11-14 DIAGNOSIS — Z09 Encounter for follow-up examination after completed treatment for conditions other than malignant neoplasm: Secondary | ICD-10-CM

## 2018-11-14 MED ORDER — ONDANSETRON HCL 4 MG PO TABS: 4.0000 mg | ORAL_TABLET | Freq: Four times a day (QID) | ORAL | 0 refills | Status: DC | PRN

## 2018-11-14 MED ORDER — OXYCODONE-ACETAMINOPHEN 5-325 MG PO TABS: 1.0000 | ORAL_TABLET | ORAL | 0 refills | Status: DC | PRN

## 2018-11-14 NOTE — Telephone Encounter (Signed)
Per Janett Billow, appointment has been scheduled for patient to see Dr. Dahlia Byes today at 3:15 pm.

## 2018-11-14 NOTE — Telephone Encounter (Signed)
Patient had surgery with Dr. Dahlia Byes on 11-08-18.  She states she was given 40 pills of percocet. Patient states she has been taking 6 a day and is still in pain.  The patient states she was supposed to be given one refill but states there were no refills.   Patient reports she took 2 pills at 7 am, 2 pills at 1 pm and 2 pills at around 7-8 pm.  Patient is requesting a call patient on her cell.   Note routed to the [facility] Clinical Pool.

## 2018-11-14 NOTE — Patient Instructions (Signed)
The patient is aware to call back for any questions or new concerns.  

## 2018-11-14 NOTE — Progress Notes (Signed)
S/p open ventral hernia repair Doing well but having pain issues ( know chronic pain issues) Taking po Ambulating well  PE NAD Abd: soft, mild TTP , no peritonitis, no rebound, no infection  A/p d/w the pt in detail I would only refill one last percocet prescription, he sees the pain clinic. D/W her other alternatives for pain rx No apparent complicaitons

## 2018-11-16 ENCOUNTER — Other Ambulatory Visit: Admission: RE | Admit: 2018-11-16 | Payer: Medicare Other | Source: Ambulatory Visit

## 2018-11-19 ENCOUNTER — Encounter: Payer: Self-pay | Admitting: Surgery

## 2018-11-21 ENCOUNTER — Ambulatory Visit: Payer: Medicare Other | Admitting: Anesthesiology

## 2018-11-21 ENCOUNTER — Other Ambulatory Visit: Payer: Self-pay

## 2018-11-21 ENCOUNTER — Telehealth (INDEPENDENT_AMBULATORY_CARE_PROVIDER_SITE_OTHER): Payer: Medicare Other | Admitting: Surgery

## 2018-11-21 ENCOUNTER — Encounter: Payer: Medicare Other | Admitting: Surgery

## 2018-11-21 DIAGNOSIS — Z09 Encounter for follow-up examination after completed treatment for conditions other than malignant neoplasm: Secondary | ICD-10-CM

## 2018-11-21 NOTE — Progress Notes (Signed)
S/P ventral hernia repair. I placed a Phone call to the pt. Talk to her directly [facility] is overall doing well but complains of some pain. Please note that she does have a history of chronic pain issues.  No fevers no chills she is taking p.o.  No vomiting. She has been ambulating without an issue. Difficult conversation I am not sure if the patient was under the influence of narcotics but she apparently did not want to talk with me  and told me that she will give [place] a call back. She did not seem in any distress.

## 2018-11-26 ENCOUNTER — Other Ambulatory Visit: Payer: Self-pay

## 2018-11-26 ENCOUNTER — Ambulatory Visit (INDEPENDENT_AMBULATORY_CARE_PROVIDER_SITE_OTHER): Payer: Medicare Other

## 2018-11-26 DIAGNOSIS — E785 Hyperlipidemia, unspecified: Secondary | ICD-10-CM | POA: Diagnosis not present

## 2018-11-26 DIAGNOSIS — G894 Chronic pain syndrome: Secondary | ICD-10-CM

## 2018-11-26 NOTE — Patient Instructions (Addendum)
Thank you allowing the Chronic Care Management Team to be a part of your care! It was a pleasure speaking with you today!  1. Continue to take tylenol as needed for pain. 2. Follow your discharge instructions for activity level 3. Please limits your intake of fried foods, saturated fats, and processed sweets. 4. Try to remain as active as you can. Exercise is a great way to lower your cholesterol and raise your good cholesterol. 5. Please complete and submit your applications for food stamps  CCM (Chronic Care Management) Team   Trish Fountain RN, BSN Nurse Care Coordinator  [phone]  Ruben Reason PharmD  Clinical Pharmacist  [phone]   [place], LCSW Clinical Social Worker [phone]  Goals Addressed            This Visit's Progress   . Dr. Ancil Boozer says my cholesterol is getting worse (pt-stated)   On track    Current Barriers:  Marland Kitchen Knowledge Deficits related to understanding dyslipidemia   . Inability to purchase "healthy foods" secondary to financial strain  Nurse Case Manager Clinical Goal(s):  Marland Kitchen Over the next 14 days, patient will verbalize understanding of plan for managing her dyslipidemia through medication adherence, lipid lowering diet, and exercise -goal met 11/26/2018 . Over the next 30 days, patient will continue to follow her established treatment plan for lowering lipids .   Interventions:  . Evaluation of current treatment plan related to dyslipidemia and patient's adherence to plan as established by provider. . Confirmed that patient picked up cholesterol medications from pharmacy and is taking as directed . Discussed plans with patient for ongoing care management follow up and provided patient with direct contact information for care management team . Reinforced utilization for previously given food bank resources if needed . Assessed for completion and submission of food stamps application   Patient Self Care Activities:  . Self  administers medications as prescribed . Attends all scheduled provider appointments . Calls pharmacy for medication refills . Calls provider office for new concerns or questions  . Adhere to lipid lowering diet . Exercise as tolerated  Please see past updates related to this goal by clicking on the "Past Updates" button in the selected goal      . COMPLETED: I am having hernia surgery tomorrow (pt-stated)       Current Barriers:  . Lacks caregiver support.  . Film/video editor.   Nurse Case Manager Clinical Goal(s):  Marland Kitchen Over the next 7 days, patient will verbalize understanding of plan for hernia surgery recovery  Interventions:  . Provided emotional support and reassurance . Reviewed surgical pre-op notes . Assessed for support at home during surgery recovery . Confirmed follow up appointment with surgeon 11/21/2018 at 1:30  Patient Self Care Activities:  . Self administers medications as prescribed . Attends all scheduled provider appointments . Calls pharmacy for medication refills . Attends church or other social activities . Performs ADL's independently . Performs IADL's independently . Calls provider office for new concerns or questions  Initial goal documentation        Print copy of patient instructions provided.   Telephone follow up appointment with care management team member scheduled for: 30 days  SYMPTOMS OF A STROKE   You have any symptoms of stroke. "BE FAST" is an easy way to remember the main warning signs: ? B - Balance. Signs are dizziness, sudden trouble walking, or loss of balance. ? E - Eyes. Signs are trouble seeing or a sudden  change in how you see. ? F - Face. Signs are sudden weakness or loss of feeling of the face, or the face or eyelid drooping on one side. ? A - Arms. Signs are weakness or loss of feeling in an arm. This happens suddenly and usually on one side of the body. ? S - Speech. Signs are sudden trouble speaking, slurred  speech, or trouble understanding what people say. ? T - Time. Time to call emergency services. Write down what time symptoms started.  You have other signs of stroke, such as: ? A sudden, very bad headache with no known cause. ? Feeling sick to your stomach (nausea). ? Throwing up (vomiting). ? Jerky movements you cannot control (seizure).  SYMPTOMS OF A HEART ATTACK  What are the signs or symptoms? Symptoms of this condition include:  Chest pain. It may feel like: ? Crushing or squeezing. ? Tightness, pressure, fullness, or heaviness.  Pain in the arm, neck, jaw, back, or upper body.  Shortness of breath.  Heartburn.  Indigestion.  Nausea.  Cold sweats.  Feeling tired.  Sudden lightheadedness.

## 2018-11-26 NOTE — Chronic Care Management (AMB) (Signed)
Chronic Care Management   Follow Up Note   11/26/2018 Name: Wendy Johnson MRN: [id number] DOB: [birth year]  Referred by: Steele Sizer, MD Reason for referral : Chronic Care Management (follow up chronic conditions/hernia repair)   Subjective: "I still have not filled out those papers for food stamps because I am afraid they will take my life insurance policy away"   Objective:  Lab Results  Component Value Date   CHOL 201 (H) 10/04/2018   CHOL 171 01/22/2018   CHOL 157 07/19/2017   Lab Results  Component Value Date   HDL 36 (L) 10/04/2018   HDL 31 (L) 01/22/2018   HDL 30 (L) 07/19/2017   Lab Results  Component Value Date   [facility]  10/04/2018     Comment:     . LDL cholesterol not calculated. Triglyceride levels greater than 400 mg/dL invalidate calculated LDL results. . Reference range: <100 . Desirable range <100 mg/dL for primary prevention;   <70 mg/dL for patients with CHD or diabetic patients  with > or = 2 CHD risk factors. Marland Kitchen LDL-C is now calculated using the Martin-Hopkins  calculation, which is a validated novel method providing  better accuracy than the Friedewald equation in the  estimation of LDL-C.  Cresenciano Genre et al. Annamaria Helling. 2229;798(92): 2061-2068  (http://education.[employer].com/faq/FAQ164)    [place] [address] 01/22/2018   [place]  07/19/2017     Comment:     . LDL cholesterol not calculated. Triglyceride levels greater than 400 mg/dL invalidate calculated LDL results. . Reference range: <100 . Desirable range <100 mg/dL for primary prevention;   <70 mg/dL for patients with CHD or diabetic patients  with > or = 2 CHD risk factors. Marland Kitchen LDL-C is now calculated using the Martin-Hopkins  calculation, which is a validated novel method providing  better accuracy than the Friedewald equation in the  estimation of LDL-C.  Cresenciano Genre et al. Annamaria Helling. 1194;174(08): 2061-2068  (http://education.[employer].com/faq/FAQ164)    Lab Results    Component Value Date   TRIG 558 (H) 10/04/2018   TRIG 372 (H) 01/22/2018   TRIG 405 (H) 07/19/2017   Lab Results  Component Value Date   CHOLHDL 5.6 (H) 10/04/2018   CHOLHDL 5.5 (H) 01/22/2018   CHOLHDL 5.2 (H) 07/19/2017   No results found for: LDLDIRECT  Assessment: Wendy Johnson a 53year old femalewho is a primary care patient of Steele Sizer, MD. Wendy Johnson was referred to the CCM Team by her health plan. She has a history of but not limited to Moderate COPD, Dyslipidemia, Metabolic Syndrome, bipolar disorder, and osteoarthritis involving multiple joints.6/5/2020CCM RN CM completed a telephone assessment of patients health and discussed health goals. Today I follow up with Wendy Johnson assess for needs related to her recent hernia repair 11/08/2018 and progression towards managing her dyslipidemia.  Review of patient status, including review of consultants reports, relevant laboratory and other test results, and collaboration with appropriate care team members and the patient's provider was performed as part of comprehensive patient evaluation and provision of chronic care management services.    Goals Addressed            This Visit's Progress    Dr. Ancil Boozer says my cholesterol is getting worse (pt-stated)   On track    Wendy Johnson states she is doing what she can to manage her cholesterol. Now that medicaid is paying for her medications, she has not barriers to remaining complicant with medications. She continues to live on 800.00/month. She lives with her  boyfriend and has not bills in her name but she does pay "all utilities, cell phone bill, car insurance, food". This limits the "healthy foods" she can buy. She has been provided application to complete for food stamps but is extremely hesitate as she feels they will take her life insurance policy away. She has been reassured that this is not the case and encouraged to call social services for clarification. She is  remaining active in and out of her home although any exercise right now is limited by her recent hernia repair.  Current Barriers:   Knowledge Deficits related to understanding dyslipidemia    Inability to purchase "healthy foods" secondary to financial strain  Nurse Case Manager Clinical Goal(s):   Over the next 14 days, patient will verbalize understanding of plan for managing her dyslipidemia through medication adherence, lipid lowering diet, and exercise -goal met 11/26/2018  Over the next 30 days, patient will continue to follow her established treatment plan for lowering lipids    Interventions:   Evaluation of current treatment plan related to dyslipidemia and patient's adherence to plan as established by provider.  Confirmed that patient picked up cholesterol medications from pharmacy and is taking as directed  Discussed plans with patient for ongoing care management follow up and provided patient with direct contact information for care management team  Reinforced utilization for previously given food bank resources if needed  Assessed for completion and submission of food stamps application   Patient Self Care Activities:   Self administers medications as prescribed  Attends all scheduled provider appointments  Calls pharmacy for medication refills  Calls provider office for new concerns or questions   Adhere to lipid lowering diet  Exercise as tolerated  Please see past updates related to this goal by clicking on the "Past Updates" button in the selected goal        Telephone follow up appointment with care management team member scheduled for: 30 days    Caelen Reierson E. Rollene Rotunda, RN, BSN Nurse Care Coordinator [facility] Care Management  [phone]

## 2018-11-27 DIAGNOSIS — J449 Chronic obstructive pulmonary disease, unspecified: Secondary | ICD-10-CM | POA: Diagnosis not present

## 2018-11-27 DIAGNOSIS — G4733 Obstructive sleep apnea (adult) (pediatric): Secondary | ICD-10-CM | POA: Diagnosis not present

## 2018-11-29 ENCOUNTER — Other Ambulatory Visit: Payer: Self-pay | Admitting: Family Medicine

## 2018-11-30 NOTE — Telephone Encounter (Signed)
Pt following up on refill request for  ibuprofen (ADVIL) 800 MG tablet  [facility] [address] [place], [place] (Phone) [phone] (Fax)

## 2018-12-07 DIAGNOSIS — M25511 Pain in right shoulder: Secondary | ICD-10-CM | POA: Diagnosis not present

## 2018-12-07 DIAGNOSIS — M19011 Primary osteoarthritis, right shoulder: Secondary | ICD-10-CM | POA: Diagnosis not present

## 2018-12-07 DIAGNOSIS — M75101 Unspecified rotator cuff tear or rupture of right shoulder, not specified as traumatic: Secondary | ICD-10-CM | POA: Diagnosis not present

## 2018-12-19 ENCOUNTER — Other Ambulatory Visit: Payer: Self-pay | Admitting: Anesthesiology

## 2018-12-19 ENCOUNTER — Ambulatory Visit (HOSPITAL_BASED_OUTPATIENT_CLINIC_OR_DEPARTMENT_OTHER): Payer: Medicare Other | Admitting: Anesthesiology

## 2018-12-19 ENCOUNTER — Encounter: Payer: Self-pay | Admitting: Anesthesiology

## 2018-12-19 ENCOUNTER — Other Ambulatory Visit: Payer: Self-pay

## 2018-12-19 ENCOUNTER — Ambulatory Visit
Admission: RE | Admit: 2018-12-19 | Discharge: 2018-12-19 | Disposition: A | Discharge status: Home / Self Care | Payer: Medicare Other | Source: Ambulatory Visit | Attending: Anesthesiology | Admitting: Anesthesiology

## 2018-12-19 VITALS — BP 154/99 | HR 100 | Temp 98.9°F | Resp 19 | Ht 64.0 in | Wt 244.0 lb

## 2018-12-19 DIAGNOSIS — M47816 Spondylosis without myelopathy or radiculopathy, lumbar region: Secondary | ICD-10-CM | POA: Diagnosis not present

## 2018-12-19 DIAGNOSIS — M545 Low back pain, unspecified: Secondary | ICD-10-CM

## 2018-12-19 DIAGNOSIS — F1190 Opioid use, unspecified, uncomplicated: Secondary | ICD-10-CM

## 2018-12-19 DIAGNOSIS — M25552 Pain in left hip: Secondary | ICD-10-CM

## 2018-12-19 DIAGNOSIS — M5136 Other intervertebral disc degeneration, lumbar region: Secondary | ICD-10-CM | POA: Diagnosis not present

## 2018-12-19 DIAGNOSIS — M25519 Pain in unspecified shoulder: Secondary | ICD-10-CM

## 2018-12-19 DIAGNOSIS — M5432 Sciatica, left side: Secondary | ICD-10-CM

## 2018-12-19 DIAGNOSIS — R52 Pain, unspecified: Secondary | ICD-10-CM

## 2018-12-19 DIAGNOSIS — G894 Chronic pain syndrome: Secondary | ICD-10-CM | POA: Diagnosis not present

## 2018-12-19 DIAGNOSIS — M542 Cervicalgia: Secondary | ICD-10-CM | POA: Insufficient documentation

## 2018-12-19 DIAGNOSIS — M25551 Pain in right hip: Secondary | ICD-10-CM | POA: Insufficient documentation

## 2018-12-19 DIAGNOSIS — M25511 Pain in right shoulder: Secondary | ICD-10-CM | POA: Diagnosis not present

## 2018-12-19 DIAGNOSIS — M51369 Other intervertebral disc degeneration, lumbar region without mention of lumbar back pain or lower extremity pain: Secondary | ICD-10-CM

## 2018-12-19 HISTORY — DX: Pain in unspecified shoulder: M25.519

## 2018-12-19 MED ORDER — TRIAMCINOLONE ACETONIDE 40 MG/ML IJ SUSP
40.0000 mg | Freq: Once | INTRAMUSCULAR | Status: AC
  Administered 2018-12-19: 40 mg

## 2018-12-19 MED ORDER — ROPIVACAINE HCL 2 MG/ML IJ SOLN
INTRAMUSCULAR | Status: AC
  Filled 2018-12-19: qty 10

## 2018-12-19 MED ORDER — OXYCODONE HCL 10 MG PO TABS: 10.0000 mg | ORAL_TABLET | Freq: Four times a day (QID) | ORAL | 0 refills | Status: AC

## 2018-12-19 MED ORDER — ROPIVACAINE HCL 2 MG/ML IJ SOLN
10.0000 mL | Freq: Once | INTRAMUSCULAR | Status: AC
  Administered 2018-12-19: 10 mL via EPIDURAL

## 2018-12-19 MED ORDER — LIDOCAINE HCL (PF) 1 % IJ SOLN
INTRAMUSCULAR | Status: AC
  Filled 2018-12-19: qty 5

## 2018-12-19 MED ORDER — TRIAMCINOLONE ACETONIDE 40 MG/ML IJ SUSP
INTRAMUSCULAR | Status: AC
  Filled 2018-12-19: qty 1

## 2018-12-19 MED ORDER — SODIUM CHLORIDE 0.9% FLUSH
10.0000 mL | Freq: Once | INTRAVENOUS | Status: AC
  Administered 2018-12-19: 10 mL

## 2018-12-19 MED ORDER — LIDOCAINE HCL (PF) 1 % IJ SOLN
5.0000 mL | Freq: Once | INTRAMUSCULAR | Status: AC
  Administered 2018-12-19: 5 mL via SUBCUTANEOUS

## 2018-12-19 MED ORDER — IOPAMIDOL (ISOVUE-M 200) INJECTION 41%: 20.0000 mL | Freq: Once | INTRAMUSCULAR | Status: DC | PRN

## 2018-12-19 MED ORDER — SODIUM CHLORIDE (PF) 0.9 % IJ SOLN
INTRAMUSCULAR | Status: AC
  Filled 2018-12-19: qty 10

## 2018-12-19 NOTE — Patient Instructions (Signed)
You were given two prescriptions for Oxycodone ____________________________________________________________________________________________  Post-procedure Information What to expect: Most procedures involve the use of a local anesthetic (numbing medicine), and a steroid (anti-inflammatory medicine).  The local anesthetics may cause temporary numbness and weakness of the legs or arms, depending on the location of the block. This numbness/weakness may last 4-6 hours, depending on the local anesthetic used. In rare instances, it can last up to 24 hours. While numb, you must be very careful not to injure the extremity.  After any procedure, you could expect the pain to get better within 15-20 minutes. This relief is temporary and may last 4-6 hours. Once the local anesthetics wears off, you could experience discomfort, possibly more than usual, for up to 10 (ten) days. In the case of radiofrequencies, it may last up to 6 weeks. Surgeries may take up to 8 weeks for the healing process. The discomfort is due to the irritation caused by needles going through skin and muscle. To minimize the discomfort, we recommend using ice the first day, and heat from then on. The ice should be applied for 15 minutes on, and 15 minutes off. Keep repeating this cycle until bedtime. Avoid applying the ice directly to the skin, to prevent frostbite. Heat should be used daily, until the pain improves (4-10 days). Be careful not to burn yourself.  Occasionally you may experience muscle spasms or cramps. These occur as a consequence of the irritation caused by the needle sticks to the muscle and the blood that will inevitably be lost into the surrounding muscle tissue. Blood tends to be very irritating to tissues, which tend to react by going into spasm. These spasms may start the same day of your procedure, but they may also take days to develop. This late onset type of spasm or cramp is usually caused by electrolyte imbalances  triggered by the steroids, at the level of the kidney. Cramps and spasms tend to respond well to muscle relaxants, multivitamins (some are triggered by the procedure, but may have their origins in vitamin deficiencies), and "Gatorade", or any sports drinks that can replenish any electrolyte imbalances. (If you are a diabetic, ask your pharmacist to get you a sugar-free brand.) Warm showers or baths may also be helpful. Stretching exercises are highly recommended.  General Instructions:  Be alert for signs of possible infection: redness, swelling, heat, red streaks, elevated temperature, and/or fever. These typically appear 4 to 6 days after the procedure. Immediately notify your doctor if you experience unusual bleeding, difficulty breathing, or loss of bowel or bladder control. If you experience increased pain, do not increase your pain medicine intake, unless instructed by your pain physician.  Post-Procedure Care:  Be careful in moving about. Muscle spasms in the area of the injection may occur. Applying ice or heat to the area is often helpful. The incidence of spinal headaches after epidural injections ranges between 1.4% and 6%. If you develop a headache that does not seem to respond to conservative therapy, please let your physician know. This can be treated with an epidural blood patch.   Post-procedure numbness or redness is to be expected, however it should average 4 to 6 hours. If numbness and weakness of your extremities begins to develop 4 to 6 hours after your procedure, and is felt to be progressing and worsening, immediately contact your physician.  Diet:  If you experience nausea, do not eat until this sensation goes away. If you had a "Stellate Ganglion Block" for upper extremity "  Reflex Sympathetic Dystrophy", do not eat or drink until your hoarseness goes away. In any case, always start with liquids first and if you tolerate them well, then slowly progress to more solid  foods.  Activity:  For the first 4 to 6 hours after the procedure, use caution in moving about as you may experience numbness and/or weakness. Use caution in cooking, using household electrical appliances, and climbing steps. If you need to reach your Doctor call our office: [phone] (During business hours) or [phone] (After business hours).  Business Hours: Monday-Thursday 8:00 am - 4:00 PM    Fridays: Closed     In case of an emergency: In case of emergency, call 911 or go to the nearest emergency room and have the physician there call us.  Interpretation of Procedure Every nerve block has two components: a diagnostic component, and a treatment component. Unrealistic expectations are the most common causes of "perceived failure".  In a perfect world, a single nerve block should be able to completely and permanently eliminate the pain. Sadly, the world is not perfect.  Most pain management nerve blocks are performed using local anesthetics and steroids. Steroids are responsible for any long-term benefit that you may experience. Their purpose is to decrease any chronic swelling that may exist in the area. Steroids begin to work immediately after being injected. However, most patients will not experience any benefits until 5 to 10 days after the injection, when the swelling has come down to the point where they can tell a difference. Steroids will only help if there is swelling to be treated. As such, they can assist with the diagnosis. If effective, they suggest an inflammatory component to the pain, and if ineffective, they rule out inflammation as the main cause or component of the problem. If the problem is one of mechanical compression, you will get no benefit from those steroids.   In the case of local anesthetics, they have a crucial role in the diagnosis of your condition. Most will begin to work within15 to 20 minutes after injection. The duration will depend on the type used  (short- vs. Long-acting). It is of outmost importance that patients keep tract of their pain, after the procedure. To assist with this matter, a "Post-procedure Pain Diary" is provided. Make sure to complete it and to bring it back to your follow-up appointment.  As long as the patient keeps accurate, detailed records of their symptoms after every procedure, and returns to have those interpreted, every procedure will provide [place] with invaluable information. Even a block that does not provide the patient with any relief, will always provide [place] with information about the mechanism and the origin of the pain. The only time a nerve block can be considered a waste of time is when patients do not keep track of the results, or do not keep their post-procedure appointment.  Reporting the results back to your physician The Pain Score  Pain is a subjective complaint. It cannot be seen, touched, or measured. We depend entirely on the patient's report of the pain in order to assess your condition and treatment. To evaluate the pain, we use a pain scale, where "0" means "No Pain", and a "10" is "the worst possible pain that you can even imagine" (i.e. something like been eaten alive by a shark or being torn apart by a lion).   Use the Pain Scale provided. You will frequently be asked to rate your pain. Please be accurate, remember that  medical decisions will be based on your responses. Please do not rate your pain above a 10. Doing so is actually interpreted as "symptom magnification" (exaggeration). To put this into perspective, when you tell us that your pain is at a 10 (ten), what you are saying is that there is nothing we can do to make this pain any worse. (Carefully think about that.) ____________________________________________________________________________________________

## 2018-12-19 NOTE — Progress Notes (Signed)
Subjective:  Patient ID: Wendy Johnson, female    DOB: [birth year]  Age: 53 y.o. MRN: [id number]  CC: Neck Pain and Shoulder Pain   Procedure: L5-S1 epidural steroid injection under fluoroscopic guidance without sedation  HPI Wendy Johnson presents for reevaluation today.  She was last seen a few months ago.  She has had gradual evolution of increasing low back pain with radiation into the bilateral hips buttocks and into the right posterior calf.  She has had this in the past and received previous epidural steroid injections.  Her last injection was in February 2020 and she reports approximately 75% relief lasting 2 months or so.  She still takes her oxycodone 4 times a day and this continues to work well for her.  Unfortunately she has diffuse pain in the low back legs right shoulder hips and buttocks.  She is due for an evaluation with orthopedics on August 17 for consideration for possible right total shoulder replacement.  Unfortunately she is failed conservative therapy and anti-inflammatories have been ineffective.  She takes her oxycodone as prescribed and this generally gives her good pain relief.  She denies any diverting or illicit use and continues to derive good functional benefit from the medications.  Otherwise her back pain is considerable and she desires to proceed with an epidural steroid injection today.  No changes in lower extremity strength or function are noted.  Outpatient Medications Prior to Visit  Medication Sig Dispense Refill  . albuterol (PROVENTIL) (2.5 MG/3ML) 0.083% nebulizer solution Take 3 mLs (2.5 mg total) by nebulization every 6 (six) hours as needed for wheezing or shortness of breath. 75 mL 1  . amphetamine-dextroamphetamine (ADDERALL) 15 MG tablet Take 15 mg by mouth daily.     Marland Kitchen amphetamine-dextroamphetamine (ADDERALL) 30 MG tablet Take 30 mg by mouth 2 (two) times daily.    Marland Kitchen atorvastatin (LIPITOR) 40 MG tablet Take 1 tablet (40 mg total) by mouth daily.  90 tablet 0  . busPIRone (BUSPAR) 30 MG tablet Take 30 mg by mouth 2 (two) times daily.     . clonazePAM (KLONOPIN) 1 MG tablet Take 1 mg by mouth 6 (six) times daily.     . cyclobenzaprine (FLEXERIL) 10 MG tablet Take 10 mg by mouth 3 (three) times daily as needed for muscle spasms.    . cyclobenzaprine (FLEXERIL) 5 MG tablet Take 1 tablet (5 mg total) by mouth 3 (three) times daily as needed for muscle spasms. 30 tablet 0  . DEXILANT 60 MG capsule TAKE 1 CAPSULE BY MOUTH DAILY (Patient taking differently: Take 60 mg by mouth daily. ) 30 capsule 5  . HYDROcodone-acetaminophen (NORCO/VICODIN) 5-325 MG tablet Take 1 tablet by mouth every 6 (six) hours as needed for moderate pain. 12 tablet 0  . hydrOXYzine (ATARAX/VISTARIL) 25 MG tablet Take 25 mg by mouth every 6 (six) hours as needed for anxiety or itching (hives).    Marland Kitchen ibuprofen (ADVIL) 800 MG tablet TAKE 1 TABLET BY MOUTH EVERY 8 HOURS AS NEEDED. 30 tablet 0  . LATUDA 40 MG TABS tablet Take 40 mg by mouth daily with breakfast.     . omega-3 acid ethyl esters (LOVAZA) 1 g capsule Take 2 capsules (2 g total) by mouth 2 (two) times daily. 360 capsule 0  . ondansetron (ZOFRAN) 4 MG tablet Take 1 tablet (4 mg total) by mouth every 6 (six) hours as needed for nausea or vomiting. 20 tablet 0  . oxyCODONE-acetaminophen (PERCOCET) 5-325 MG tablet Take  1 tablet by mouth every 4 (four) hours as needed for severe pain. 30 tablet 0  . PROAIR HFA 108 (90 Base) MCG/ACT inhaler Inhale 2 puffs into the lungs every 4 (four) hours as needed for wheezing or shortness of breath. 8.5 g 0  . sertraline (ZOLOFT) 100 MG tablet Take 100 mg by mouth 2 (two) times daily.    Marland Kitchen tiotropium (SPIRIVA HANDIHALER) 18 MCG inhalation capsule Place 1 capsule (18 mcg total) into inhaler and inhale daily. 30 capsule 5  . traZODone (DESYREL) 100 MG tablet Take 400 mg by mouth at bedtime.     . Oxycodone HCl 10 MG TABS Take 1 tablet (10 mg total) by mouth 4 (four) times daily for 30  days. 120 tablet 0   No facility-administered medications prior to visit.     Review of Systems CNS: No confusion or sedation Cardiac: No angina or palpitations GI: No abdominal pain or constipation Constitutional: No nausea vomiting fevers or chills  Objective:  BP (!) 159/107 Comment: Dr Andree Elk notified  Pulse 100   Temp 98.9 F (37.2 C)   Resp 18   Ht 5\' 4"  (1.626 m)   Wt 244 lb (110.7 kg)   LMP 09/26/2014 Comment: Total  SpO2 97%   BMI 41.88 kg/m    BP Readings from Last 3 Encounters:  12/19/18 (!) 159/107  11/14/18 133/89  11/08/18 104/77     Wt Readings from Last 3 Encounters:  12/19/18 244 lb (110.7 kg)  11/14/18 247 lb 6.4 oz (112.2 kg)  11/08/18 255 lb 14.4 oz (116.1 kg)     Physical Exam Pt is alert and oriented PERRL EOMI HEART IS RRR no murmur or rub LCTA no wheezing or rales MUSCULOSKELETAL reveals some paraspinous muscle tenderness but no overt trigger points.  She walks with an antalgic gait and her muscle tone and bulk is at baseline.  Labs  Lab Results  Component Value Date   HGBA1C 5.3 10/04/2018   HGBA1C 5.5 01/22/2018   HGBA1C 5.9 (H) 07/19/2017   Lab Results  Component Value Date   MICROALBUR 0.3 01/22/2018   [place]  10/04/2018     Comment:     . LDL cholesterol not calculated. Triglyceride levels greater than 400 mg/dL invalidate calculated LDL results. . Reference range: <100 . Desirable range <100 mg/dL for primary prevention;   <70 mg/dL for patients with CHD or diabetic patients  with > or = 2 CHD risk factors. Marland Kitchen LDL-C is now calculated using the Martin-Hopkins  calculation, which is a validated novel method providing  better accuracy than the Friedewald equation in the  estimation of LDL-C.  Cresenciano Genre et al. Annamaria Helling. 5573;220(25): 2061-2068  (http://education.[facility].com/faq/FAQ164)    CREATININE 0.54 10/16/2018     -------------------------------------------------------------------------------------------------------------------- Lab Results  Component Value Date   WBC 7.2 10/16/2018   HGB 14.2 10/16/2018   HCT 42.5 10/16/2018   PLT 202 10/16/2018   GLUCOSE 102 (H) 10/16/2018   CHOL 201 (H) 10/04/2018   TRIG 558 (H) 10/04/2018   HDL 36 (L) 10/04/2018   [place]  10/04/2018     Comment:     . LDL cholesterol not calculated. Triglyceride levels greater than 400 mg/dL invalidate calculated LDL results. . Reference range: <100 . Desirable range <100 mg/dL for primary prevention;   <70 mg/dL for patients with CHD or diabetic patients  with > or = 2 CHD risk factors. Marland Kitchen LDL-C is now calculated using the Martin-Hopkins  calculation, which is a validated novel method  providing  better accuracy than the Friedewald equation in the  estimation of LDL-C.  Cresenciano Genre et al. Annamaria Helling. 3762;831(51): 2061-2068  (http://education.[facility].com/faq/FAQ164)    ALT 20 10/16/2018   AST 20 10/16/2018   NA 139 10/16/2018   K 3.6 10/16/2018   CL 106 10/16/2018   CREATININE 0.54 10/16/2018   BUN 12 10/16/2018   CO2 25 10/16/2018   TSH 0.769 02/03/2016   INR 0.97 10/04/2016   HGBA1C 5.3 10/04/2018   MICROALBUR 0.3 01/22/2018    --------------------------------------------------------------------------------------------------------------------- No results found.   Assessment & Plan:   Wendy Johnson was seen today for neck pain and shoulder pain.  Diagnoses and all orders for this visit:  Low back pain at multiple sites  DDD (degenerative disc disease), lumbar -     Lumbar Epidural Injection  Sciatica of left side -     Lumbar Epidural Injection  Chronic, continuous use of opioids  Facet arthritis of lumbar region  Chronic pain syndrome  Cervicalgia  Pain of both hip joints  Pain of right hip joint  Other orders -     Oxycodone HCl 10 MG TABS; Take 1 tablet (10 mg total) by mouth 4  (four) times daily. -     Oxycodone HCl 10 MG TABS; Take 1 tablet (10 mg total) by mouth 4 (four) times daily.        ----------------------------------------------------------------------------------------------------------------------  Problem List Items Addressed This Visit      Unprioritized   DDD (degenerative disc disease), lumbar   Relevant Medications   Oxycodone HCl 10 MG TABS   Oxycodone HCl 10 MG TABS (Start on 01/18/2019)   Facet arthritis of lumbar region   Relevant Medications   Oxycodone HCl 10 MG TABS   Oxycodone HCl 10 MG TABS (Start on 01/18/2019)    Other Visit Diagnoses    Low back pain at multiple sites    -  Primary   Relevant Medications   Oxycodone HCl 10 MG TABS   Oxycodone HCl 10 MG TABS (Start on 01/18/2019)   Sciatica of left side       Chronic, continuous use of opioids       Chronic pain syndrome       Relevant Medications   Oxycodone HCl 10 MG TABS   Oxycodone HCl 10 MG TABS (Start on 01/18/2019)   Cervicalgia       Pain of both hip joints       Pain of right hip joint            ----------------------------------------------------------------------------------------------------------------------  1. DDD (degenerative disc disease), lumbar Based on our discussion today and the persistent sciatica of her recurrent nature we will proceed with a repeat epidural today.  We gone over the risks and benefits of the procedure with her in full detail and all questions were answered.  Lab return to clinic in 2 months for possible repeat epidural injection if indicated.  I want her to continue with stretching strengthening exercises.  Presently her pain in the lower extremities is right greater than left - Lumbar Epidural Injection  2. Sciatica of right side As above and continue stretching strengthening exercise - Lumbar Epidural Injection  3. Low back pain at multiple sites   4. Chronic, continuous use of opioids I have reviewed the [facility] practitioner database information and it is appropriate.  We will refill her medications for the next 2 months for August 12 and September 11.  Secondary to the severity of right shoulder pain  I am going to allow her to increase her oxycodone to 2 tablets in the morning and this will yield 5 tablets/day for the next month and then back to 120 tablets for the following month.  Should she have any further problems in the meantime she is instructed to contact us at the pain control center and continue follow-up with her primary care physicians  5. Facet arthritis of lumbar region   6. Chronic pain syndrome As above  7. Cervicalgia As above  8. Pain of both hip joints   9. Pain of right hip joint     ----------------------------------------------------------------------------------------------------------------------  I have changed Wendy Johnson Oxycodone HCl. I am also having her start on Oxycodone HCl. Additionally, I am having her maintain her amphetamine-dextroamphetamine, busPIRone, sertraline, hydrOXYzine, clonazePAM, traZODone, cyclobenzaprine, albuterol, Latuda, amphetamine-dextroamphetamine, Dexilant, tiotropium, ProAir HFA, omega-3 acid ethyl esters, atorvastatin, HYDROcodone-acetaminophen, cyclobenzaprine, ondansetron, oxyCODONE-acetaminophen, and ibuprofen.   Meds ordered this encounter  Medications  . Oxycodone HCl 10 MG TABS    Sig: Take 1 tablet (10 mg total) by mouth 4 (four) times daily.    Dispense:  150 tablet    Refill:  0  . Oxycodone HCl 10 MG TABS    Sig: Take 1 tablet (10 mg total) by mouth 4 (four) times daily.    Dispense:  120 tablet    Refill:  0   Patient's Medications  New Prescriptions   OXYCODONE HCL 10 MG TABS    Take 1 tablet (10 mg total) by mouth 4 (four) times daily.  Previous Medications   ALBUTEROL (PROVENTIL) (2.5 MG/3ML) 0.083% NEBULIZER SOLUTION    Take 3 mLs (2.5 mg total) by nebulization every 6 (six) hours as needed  for wheezing or shortness of breath.   AMPHETAMINE-DEXTROAMPHETAMINE (ADDERALL) 15 MG TABLET    Take 15 mg by mouth daily.    AMPHETAMINE-DEXTROAMPHETAMINE (ADDERALL) 30 MG TABLET    Take 30 mg by mouth 2 (two) times daily.   ATORVASTATIN (LIPITOR) 40 MG TABLET    Take 1 tablet (40 mg total) by mouth daily.   BUSPIRONE (BUSPAR) 30 MG TABLET    Take 30 mg by mouth 2 (two) times daily.    CLONAZEPAM (KLONOPIN) 1 MG TABLET    Take 1 mg by mouth 6 (six) times daily.    CYCLOBENZAPRINE (FLEXERIL) 10 MG TABLET    Take 10 mg by mouth 3 (three) times daily as needed for muscle spasms.   CYCLOBENZAPRINE (FLEXERIL) 5 MG TABLET    Take 1 tablet (5 mg total) by mouth 3 (three) times daily as needed for muscle spasms.   DEXILANT 60 MG CAPSULE    TAKE 1 CAPSULE BY MOUTH DAILY   HYDROCODONE-ACETAMINOPHEN (NORCO/VICODIN) 5-325 MG TABLET    Take 1 tablet by mouth every 6 (six) hours as needed for moderate pain.   HYDROXYZINE (ATARAX/VISTARIL) 25 MG TABLET    Take 25 mg by mouth every 6 (six) hours as needed for anxiety or itching (hives).   IBUPROFEN (ADVIL) 800 MG TABLET    TAKE 1 TABLET BY MOUTH EVERY 8 HOURS AS NEEDED.   LATUDA 40 MG TABS TABLET    Take 40 mg by mouth daily with breakfast.    OMEGA-3 ACID ETHYL ESTERS (LOVAZA) 1 G CAPSULE    Take 2 capsules (2 g total) by mouth 2 (two) times daily.   ONDANSETRON (ZOFRAN) 4 MG TABLET    Take 1 tablet (4 mg total) by mouth every 6 (six) hours as needed  for nausea or vomiting.   OXYCODONE-ACETAMINOPHEN (PERCOCET) 5-325 MG TABLET    Take 1 tablet by mouth every 4 (four) hours as needed for severe pain.   PROAIR HFA 108 (90 BASE) MCG/ACT INHALER    Inhale 2 puffs into the lungs every 4 (four) hours as needed for wheezing or shortness of breath.   SERTRALINE (ZOLOFT) 100 MG TABLET    Take 100 mg by mouth 2 (two) times daily.   TIOTROPIUM (SPIRIVA HANDIHALER) 18 MCG INHALATION CAPSULE    Place 1 capsule (18 mcg total) into inhaler and inhale daily.   TRAZODONE  (DESYREL) 100 MG TABLET    Take 400 mg by mouth at bedtime.   Modified Medications   Modified Medication Previous Medication   OXYCODONE HCL 10 MG TABS Oxycodone HCl 10 MG TABS      Take 1 tablet (10 mg total) by mouth 4 (four) times daily.    Take 1 tablet (10 mg total) by mouth 4 (four) times daily for 30 days.  Discontinued Medications   No medications on file   ----------------------------------------------------------------------------------------------------------------------  Follow-up: Return in about 2 months (around 02/18/2019) for procedure, evaluation.  Procedure: L5-S1 epidural steroid under fluoroscopic guidance without sedation   Procedure: L5-S1 LESI with fluoroscopic guidance and no moderate sedation  NOTE: The risks, benefits, and expectations of the procedure have been discussed and explained to the patient who was understanding and in agreement with suggested treatment plan. No guarantees were made.  DESCRIPTION OF PROCEDURE: Lumbar epidural steroid injection with no IV Versed, EKG, blood pressure, pulse, and pulse oximetry monitoring. The procedure was performed with the patient in the prone position under fluoroscopic guidance.  Sterile prep x3 was initiated and I then injected subcutaneous lidocaine to the overlying L5-S1 site after its fluoroscopic identifictation.  Using strict aseptic technique, I then advanced an 18-gauge Tuohy epidural needle in the midline using interlaminar approach via loss-of-resistance to saline technique. There was negative aspiration for heme or  CSF.  I then confirmed position with both AP and Lateral fluoroscan.  2 cc of Isovue were injected and a  total of 5 mL of Preservative-Free normal saline mixed with 40 mg of Kenalog and 1cc Ropicaine 0.2 percent were injected incrementally via the  epidurally placed needle. The needle was removed. The patient tolerated the injection well and was convalesced and discharged to home in stable condition.  Should the patient have any post procedure difficulty they have been instructed on how to contact us for assistance.    Molli Barrows, MD

## 2018-12-19 NOTE — Progress Notes (Signed)
Safety precautions to be maintained throughout the outpatient stay will include: orient to surroundings, keep bed in low position, maintain call bell within reach at all times, provide assistance with transfer out of bed and ambulation. Safety precautions to be maintained throughout the outpatient stay will include: orient to surroundings, keep bed in low position, maintain call bell within reach at all times, provide assistance with transfer out of bed and ambulation.  

## 2018-12-19 NOTE — Progress Notes (Signed)
Nursing Pain Medication Assessment:  Safety precautions to be maintained throughout the outpatient stay will include: orient to surroundings, keep bed in low position, maintain call bell within reach at all times, provide assistance with transfer out of bed and ambulation.  Medication Inspection Compliance: Ms. Jarriel did not comply with our request to bring her pills to be counted. She was reminded that bringing the medication bottles, even when empty, is a requirement.  Medication: None brought in. Pill/Patch Count: None available to be counted. Bottle Appearance: No container available. Did not bring bottle(s) to appointment. Filled Date: N/A Last Medication intake:  Yesterday 

## 2018-12-24 ENCOUNTER — Ambulatory Visit: Payer: Self-pay

## 2018-12-24 ENCOUNTER — Telehealth: Payer: Self-pay

## 2018-12-24 DIAGNOSIS — E785 Hyperlipidemia, unspecified: Secondary | ICD-10-CM

## 2018-12-24 NOTE — Chronic Care Management (AMB) (Signed)
   Chronic Care Management   Unsuccessful Call Note 12/24/2018 Name: Wendy Johnson MRN: [id number] DOB: Sep 24, 1965   [name] a 53year old femalewho is a primary care patient of Steele Sizer, MD. Ms Widmann was referred to the CCM Team by her health plan. She has a history of but not limited to Moderate COPD, Dyslipidemia, Metabolic Syndrome, bipolar disorder, and osteoarthritis involving multiple joints.   Was unable to reach patient via telephone today for monthly follow up. I have left HIPAA compliant voicemail asking patient to return my call. (unsuccessful outreach #1).   Plan: Will follow-up within 7 business days via telephone.      Eara Burruel E. Rollene Rotunda, RN, BSN Nurse Care Coordinator [facility] Care Management  [phone]

## 2018-12-28 DIAGNOSIS — G4733 Obstructive sleep apnea (adult) (pediatric): Secondary | ICD-10-CM | POA: Diagnosis not present

## 2018-12-28 DIAGNOSIS — J449 Chronic obstructive pulmonary disease, unspecified: Secondary | ICD-10-CM | POA: Diagnosis not present

## 2018-12-31 ENCOUNTER — Ambulatory Visit: Payer: Self-pay

## 2018-12-31 ENCOUNTER — Telehealth: Payer: Self-pay

## 2018-12-31 NOTE — Chronic Care Management (AMB) (Signed)
   Chronic Care Management   Unsuccessful Call Note 12/24/2018 Name: Wendy Johnson       MRN: [id number]       DOB: 05-05-1966  [name] a 53year old femalewho is a primary care patient of Steele Sizer, MD. [name] was referred to the CCM Team by her health plan. She has a history of but not limited to Moderate COPD, Dyslipidemia, Metabolic Syndrome, bipolar disorder, and osteoarthritis involving multiple joints.  Was unable to reach patient via telephone today formonthly follow up. Ihave left HIPAA compliant voicemail asking patient to return my call. (unsuccessful outreach #2).  Plan: Will await return call from patient    Mariateresa Batra E. Rollene Rotunda, RN, BSN Nurse Care Coordinator [facility] Care Management  [phone]

## 2019-01-18 ENCOUNTER — Telehealth: Payer: Self-pay | Admitting: *Deleted

## 2019-01-25 ENCOUNTER — Other Ambulatory Visit: Payer: Self-pay | Admitting: Orthopedic Surgery

## 2019-01-25 DIAGNOSIS — M19011 Primary osteoarthritis, right shoulder: Secondary | ICD-10-CM | POA: Diagnosis not present

## 2019-01-25 DIAGNOSIS — M75101 Unspecified rotator cuff tear or rupture of right shoulder, not specified as traumatic: Secondary | ICD-10-CM | POA: Diagnosis not present

## 2019-01-28 DIAGNOSIS — J449 Chronic obstructive pulmonary disease, unspecified: Secondary | ICD-10-CM | POA: Diagnosis not present

## 2019-01-28 DIAGNOSIS — G4733 Obstructive sleep apnea (adult) (pediatric): Secondary | ICD-10-CM | POA: Diagnosis not present

## 2019-01-31 ENCOUNTER — Other Ambulatory Visit: Payer: Self-pay | Admitting: Family Medicine

## 2019-01-31 DIAGNOSIS — K219 Gastro-esophageal reflux disease without esophagitis: Secondary | ICD-10-CM

## 2019-02-05 ENCOUNTER — Ambulatory Visit
Admission: RE | Admit: 2019-02-05 | Discharge: 2019-02-05 | Disposition: A | Discharge status: Home / Self Care | Payer: Medicare Other | Source: Ambulatory Visit | Attending: Orthopedic Surgery | Admitting: Orthopedic Surgery

## 2019-02-05 ENCOUNTER — Other Ambulatory Visit: Payer: Self-pay

## 2019-02-05 DIAGNOSIS — M75101 Unspecified rotator cuff tear or rupture of right shoulder, not specified as traumatic: Secondary | ICD-10-CM

## 2019-02-05 DIAGNOSIS — M19011 Primary osteoarthritis, right shoulder: Secondary | ICD-10-CM

## 2019-02-05 DIAGNOSIS — M25511 Pain in right shoulder: Secondary | ICD-10-CM | POA: Diagnosis not present

## 2019-02-06 ENCOUNTER — Ambulatory Visit (INDEPENDENT_AMBULATORY_CARE_PROVIDER_SITE_OTHER): Payer: Medicare Other | Admitting: Family Medicine

## 2019-02-06 ENCOUNTER — Encounter: Payer: Self-pay | Admitting: Family Medicine

## 2019-02-06 ENCOUNTER — Other Ambulatory Visit (HOSPITAL_COMMUNITY)
Admission: RE | Admit: 2019-02-06 | Discharge: 2019-02-06 | Disposition: A | Discharge status: Home / Self Care | Payer: Medicare Other | Source: Ambulatory Visit | Attending: Family Medicine | Admitting: Family Medicine

## 2019-02-06 ENCOUNTER — Telehealth: Payer: Self-pay | Admitting: Anesthesiology

## 2019-02-06 VITALS — BP 132/84 | HR 95 | Temp 97.8°F | Resp 14 | Ht 64.0 in | Wt 245.1 lb

## 2019-02-06 DIAGNOSIS — M2550 Pain in unspecified joint: Secondary | ICD-10-CM | POA: Diagnosis not present

## 2019-02-06 DIAGNOSIS — R300 Dysuria: Secondary | ICD-10-CM | POA: Diagnosis not present

## 2019-02-06 DIAGNOSIS — N3001 Acute cystitis with hematuria: Secondary | ICD-10-CM

## 2019-02-06 DIAGNOSIS — N760 Acute vaginitis: Secondary | ICD-10-CM | POA: Insufficient documentation

## 2019-02-06 DIAGNOSIS — Z23 Encounter for immunization: Secondary | ICD-10-CM

## 2019-02-06 LAB — POCT URINALYSIS DIPSTICK
Bilirubin, UA: NEGATIVE
Blood, UA: POSITIVE
Glucose, UA: NEGATIVE
Ketones, UA: NEGATIVE
Nitrite, UA: NEGATIVE
Odor: NORMAL
Protein, UA: POSITIVE — AB
Spec Grav, UA: 1.02 (ref 1.010–1.025)
Urobilinogen, UA: 0.2 E.U./dL
pH, UA: 7 (ref 5.0–8.0)

## 2019-02-06 MED ORDER — IBUPROFEN 800 MG PO TABS: 800.0000 mg | ORAL_TABLET | Freq: Three times a day (TID) | ORAL | 1 refills | Status: DC | PRN

## 2019-02-06 MED ORDER — CEPHALEXIN 500 MG PO CAPS: 500.0000 mg | ORAL_CAPSULE | Freq: Four times a day (QID) | ORAL | 0 refills | Status: AC

## 2019-02-06 MED ORDER — PHENAZOPYRIDINE HCL 200 MG PO TABS: 200.0000 mg | ORAL_TABLET | Freq: Three times a day (TID) | ORAL | 0 refills | Status: DC | PRN
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
## 2019-02-06 NOTE — Patient Instructions (Signed)
Take the antibiotics as prescribed  You can take the pyridium to help with the burning and pain, it will make your urine a dark to bright red-orange color.    We will call you with the urine culture report and the vaginal swab results.  If you need any other treatment, we will let you know then.  Please follow up if you are not feeling better in the next week.   Urinary Tract Infection, Adult A urinary tract infection (UTI) is an infection of any part of the urinary tract. The urinary tract includes:  The kidneys.  The ureters.  The bladder.  The urethra. These organs make, store, and get rid of pee (urine) in the body. What are the causes? This is caused by germs (bacteria) in your genital area. These germs grow and cause swelling (inflammation) of your urinary tract. What increases the risk? You are more likely to develop this condition if:  You have a small, thin tube (catheter) to drain pee.  You cannot control when you pee or poop (incontinence).  You are female, and: ? You use these methods to prevent pregnancy: ? A medicine that kills sperm (spermicide). ? A device that blocks sperm (diaphragm). ? You have low levels of a female hormone (estrogen). ? You are pregnant.  You have genes that add to your risk.  You are sexually active.  You take antibiotic medicines.  You have trouble peeing because of: ? A prostate that is bigger than normal, if you are female. ? A blockage in the part of your body that drains pee from the bladder (urethra). ? A kidney stone. ? A nerve condition that affects your bladder (neurogenic bladder). ? Not getting enough to drink. ? Not peeing often enough.  You have other conditions, such as: ? Diabetes. ? A weak disease-fighting system (immune system). ? Sickle cell disease. ? Gout. ? Injury of the spine. What are the signs or symptoms? Symptoms of this condition include:  Needing to pee right away (urgently).  Peeing often.   Peeing small amounts often.  Pain or burning when peeing.  Blood in the pee.  Pee that smells bad or not like normal.  Trouble peeing.  Pee that is cloudy.  Fluid coming from the vagina, if you are female.  Pain in the belly or lower back. Other symptoms include:  Throwing up (vomiting).  No urge to eat.  Feeling mixed up (confused).  Being tired and grouchy (irritable).  A fever.  Watery poop (diarrhea). How is this treated? This condition may be treated with:  Antibiotic medicine.  Other medicines.  Drinking enough water. Follow these instructions at home:  Medicines  Take over-the-counter and prescription medicines only as told by your doctor.  If you were prescribed an antibiotic medicine, take it as told by your doctor. Do not stop taking it even if you start to feel better. General instructions  Make sure you: ? Pee until your bladder is empty. ? Do not hold pee for a long time. ? Empty your bladder after sex. ? Wipe from front to back after pooping if you are a female. Use each tissue one time when you wipe.  Drink enough fluid to keep your pee pale yellow.  Keep all follow-up visits as told by your doctor. This is important. Contact a doctor if:  You do not get better after 1-2 days.  Your symptoms go away and then come back. Get help right away if:  You have  very bad back pain.  You have very bad pain in your lower belly.  You have a fever.  You are sick to your stomach (nauseous).  You are throwing up. Summary  A urinary tract infection (UTI) is an infection of any part of the urinary tract.  This condition is caused by germs in your genital area.  There are many risk factors for a UTI. These include having a small, thin tube to drain pee and not being able to control when you pee or poop.  Treatment includes antibiotic medicines for germs.  Drink enough fluid to keep your pee pale yellow. This information is not intended  to replace advice given to you by your health care provider. Make sure you discuss any questions you have with your health care provider. Document Released: 10/12/2007 Document Revised: 04/12/2018 Document Reviewed: 11/02/2017 Elsevier Patient Education  2020 [employer].

## 2019-02-07 LAB — CYTOLOGY, (ORAL, ANAL, URETHRAL) ANCILLARY ONLY
Bacterial Vaginitis (gardnerella): NEGATIVE
Candida Glabrata: NEGATIVE
Candida Vaginitis: NEGATIVE
Chlamydia: NEGATIVE
Molecular Disclaimer: NEGATIVE
Molecular Disclaimer: NEGATIVE
Molecular Disclaimer: NEGATIVE
Molecular Disclaimer: NEGATIVE
Molecular Disclaimer: NORMAL
Molecular Disclaimer: NORMAL
Neisseria Gonorrhea: NEGATIVE
Trichomonas: NEGATIVE

## 2019-02-08 LAB — URINE CULTURE
MICRO NUMBER:: 940126
SPECIMEN QUALITY:: ADEQUATE
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
## 2019-02-12 NOTE — Telephone Encounter (Signed)
Called patient and instructed her in what MD said.

## 2019-02-18 ENCOUNTER — Ambulatory Visit (HOSPITAL_BASED_OUTPATIENT_CLINIC_OR_DEPARTMENT_OTHER): Payer: Medicare Other | Admitting: Anesthesiology

## 2019-02-18 ENCOUNTER — Other Ambulatory Visit: Payer: Self-pay

## 2019-02-18 ENCOUNTER — Encounter: Payer: Self-pay | Admitting: Anesthesiology

## 2019-02-18 ENCOUNTER — Other Ambulatory Visit: Payer: Self-pay | Admitting: Anesthesiology

## 2019-02-18 ENCOUNTER — Ambulatory Visit
Admission: RE | Admit: 2019-02-18 | Discharge: 2019-02-18 | Disposition: A | Discharge status: Home / Self Care | Payer: Medicare Other | Source: Ambulatory Visit | Attending: Anesthesiology | Admitting: Anesthesiology

## 2019-02-18 VITALS — BP 126/107 | HR 93 | Temp 98.5°F | Resp 18 | Ht 64.0 in | Wt 243.0 lb

## 2019-02-18 DIAGNOSIS — M25551 Pain in right hip: Secondary | ICD-10-CM | POA: Insufficient documentation

## 2019-02-18 DIAGNOSIS — M5136 Other intervertebral disc degeneration, lumbar region: Secondary | ICD-10-CM

## 2019-02-18 DIAGNOSIS — F1190 Opioid use, unspecified, uncomplicated: Secondary | ICD-10-CM | POA: Insufficient documentation

## 2019-02-18 DIAGNOSIS — R52 Pain, unspecified: Secondary | ICD-10-CM | POA: Diagnosis not present

## 2019-02-18 DIAGNOSIS — M51369 Other intervertebral disc degeneration, lumbar region without mention of lumbar back pain or lower extremity pain: Secondary | ICD-10-CM

## 2019-02-18 DIAGNOSIS — G894 Chronic pain syndrome: Secondary | ICD-10-CM | POA: Insufficient documentation

## 2019-02-18 DIAGNOSIS — M5431 Sciatica, right side: Secondary | ICD-10-CM | POA: Diagnosis not present

## 2019-02-18 DIAGNOSIS — M545 Low back pain, unspecified: Secondary | ICD-10-CM

## 2019-02-18 HISTORY — DX: Sciatica, right side: M54.31

## 2019-02-18 MED ORDER — OXYCODONE HCL 10 MG PO TABS: 10.0000 mg | ORAL_TABLET | Freq: Four times a day (QID) | ORAL | 0 refills | Status: AC

## 2019-02-18 MED ORDER — LIDOCAINE HCL (PF) 1 % IJ SOLN
INTRAMUSCULAR | Status: AC
  Filled 2019-02-18: qty 10

## 2019-02-18 MED ORDER — ROPIVACAINE HCL 2 MG/ML IJ SOLN
INTRAMUSCULAR | Status: AC
  Filled 2019-02-18: qty 10

## 2019-02-18 MED ORDER — ROPIVACAINE HCL 2 MG/ML IJ SOLN
10.0000 mL | Freq: Once | INTRAMUSCULAR | Status: AC
  Administered 2019-02-18: 10 mL via EPIDURAL

## 2019-02-18 MED ORDER — LIDOCAINE HCL (PF) 1 % IJ SOLN
5.0000 mL | Freq: Once | INTRAMUSCULAR | Status: AC
  Administered 2019-02-18: 5 mL via SUBCUTANEOUS

## 2019-02-18 MED ORDER — TRIAMCINOLONE ACETONIDE 40 MG/ML IJ SUSP
40.0000 mg | Freq: Once | INTRAMUSCULAR | Status: AC
  Administered 2019-02-18: 40 mg

## 2019-02-18 MED ORDER — TRIAMCINOLONE ACETONIDE 40 MG/ML IJ SUSP
INTRAMUSCULAR | Status: AC
  Filled 2019-02-18: qty 1

## 2019-02-18 MED ORDER — SODIUM CHLORIDE 0.9% FLUSH
10.0000 mL | Freq: Once | INTRAVENOUS | Status: AC
  Administered 2019-02-18: 10 mL

## 2019-02-18 MED ORDER — IOPAMIDOL (ISOVUE-M 200) INJECTION 41%
20.0000 mL | Freq: Once | INTRAMUSCULAR | Status: DC | PRN
  Administered 2019-02-18: 20 mL
  Filled 2019-02-18: qty 20

## 2019-02-18 MED ORDER — OXYCODONE HCL 10 MG PO TABS: 10.0000 mg | ORAL_TABLET | Freq: Four times a day (QID) | ORAL | 0 refills | Status: DC

## 2019-02-18 MED ORDER — SODIUM CHLORIDE (PF) 0.9 % IJ SOLN
INTRAMUSCULAR | Status: AC
  Filled 2019-02-18: qty 10
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
## 2019-02-18 NOTE — Patient Instructions (Signed)
Pain Management Discharge Instructions  General Discharge Instructions :  If you need to reach your doctor call: Monday-Friday 8:00 am - 4:00 pm at [phone] or toll free [phone].  After clinic hours [phone] to have operator reach doctor.  Bring all of your medication bottles to all your appointments in the pain clinic.  To cancel or reschedule your appointment with Pain Management please remember to call 24 hours in advance to avoid a fee.  Refer to the educational materials which you have been given on: General Risks, I had my Procedure. Discharge Instructions, Post Sedation.  Post Procedure Instructions:  The drugs you were given will stay in your system until tomorrow, so for the next 24 hours you should not drive, make any legal decisions or drink any alcoholic beverages.  You may eat anything you prefer, but it is better to start with liquids then soups and crackers, and gradually work up to solid foods.  Please notify your doctor immediately if you have any unusual bleeding, trouble breathing or pain that is not related to your normal pain.  Depending on the type of procedure that was done, some parts of your body may feel week and/or numb.  This usually clears up by tonight or the next day.  Walk with the use of an assistive device or accompanied by an adult for the 24 hours.  You may use ice on the affected area for the first 24 hours.  Put ice in a Ziploc bag and cover with a towel and place against area 15 minutes on 15 minutes off.  You may switch to heat after 24 hours.Epidural Steroid Injection Patient Information  Description: The epidural space surrounds the nerves as they exit the spinal cord.  In some patients, the nerves can be compressed and inflamed by a bulging disc or a tight spinal canal (spinal stenosis).  By injecting steroids into the epidural space, we can bring irritated nerves into direct contact with a potentially helpful medication.  These  steroids act directly on the irritated nerves and can reduce swelling and inflammation which often leads to decreased pain.  Epidural steroids may be injected anywhere along the spine and from the neck to the low back depending upon the location of your pain.   After numbing the skin with local anesthetic (like Novocaine), a small needle is passed into the epidural space slowly.  You may experience a sensation of pressure while this is being done.  The entire block usually last less than 10 minutes.  Conditions which may be treated by epidural steroids:   Low back and leg pain  Neck and arm pain  Spinal stenosis  Post-laminectomy syndrome  Herpes zoster (shingles) pain  Pain from compression fractures  Preparation for the injection:  1. Do not eat any solid food or dairy products within 8 hours of your appointment.  2. You may drink clear liquids up to 3 hours before appointment.  Clear liquids include water, black coffee, juice or soda.  No milk or cream please. 3. You may take your regular medication, including pain medications, with a sip of water before your appointment  Diabetics should hold regular insulin (if taken separately) and take 1/2 normal NPH dos the morning of the procedure.  Carry some sugar containing items with you to your appointment. 4. A driver must accompany you and be prepared to drive you home after your procedure.  5. Bring all your current medications with your. 6. An IV may be inserted and   sedation may be given at the discretion of the physician.   7. A blood pressure cuff, EKG and other monitors will often be applied during the procedure.  Some patients may need to have extra oxygen administered for a short period. 8. You will be asked to provide medical information, including your allergies, prior to the procedure.  We must know immediately if you are taking blood thinners (like Coumadin/Warfarin)  Or if you are allergic to IV iodine contrast (dye). We must  know if you could possible be pregnant.  Possible side-effects:  Bleeding from needle site  Infection (rare, may require surgery)  Nerve injury (rare)  Numbness & tingling (temporary)  Difficulty urinating (rare, temporary)  Spinal headache ( a headache worse with upright posture)  Light -headedness (temporary)  Pain at injection site (several days)  Decreased blood pressure (temporary)  Weakness in arm/leg (temporary)  Pressure sensation in back/neck (temporary)  Call if you experience:  Fever/chills associated with headache or increased back/neck pain.  Headache worsened by an upright position.  New onset weakness or numbness of an extremity below the injection site  Hives or difficulty breathing (go to the emergency room)  Inflammation or drainage at the infection site  Severe back/neck pain  Any new symptoms which are concerning to you  Please note:  Although the local anesthetic injected can often make your back or neck feel good for several hours after the injection, the pain will likely return.  It takes 3-7 days for steroids to work in the epidural space.  You may not notice any pain relief for at least that one week.  If effective, we will often do a series of three injections spaced 3-6 weeks apart to maximally decrease your pain.  After the initial series, we generally will wait several months before considering a repeat injection of the same type.  If you have any questions, please call [phone] [facility] 
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
## 2019-02-22 ENCOUNTER — Encounter: Payer: Self-pay | Admitting: Anesthesiology

## 2019-02-27 DIAGNOSIS — J449 Chronic obstructive pulmonary disease, unspecified: Secondary | ICD-10-CM | POA: Diagnosis not present

## 2019-02-27 DIAGNOSIS — G4733 Obstructive sleep apnea (adult) (pediatric): Secondary | ICD-10-CM | POA: Diagnosis not present

## 2019-03-18 ENCOUNTER — Telehealth: Payer: Self-pay | Admitting: Anesthesiology

## 2019-03-18 NOTE — Telephone Encounter (Signed)
Dr. Andree Elk approved a one day early fill for her oxycodone. [employer] called and informed. Patient understands FULLY that the script must last her until 04/19/19.

## 2019-03-18 NOTE — Telephone Encounter (Signed)
No answer. LVM for her to return call and make an appointment for December. NO EARLY REFILLS ON PAIN MEDICATIONS. NO EXCEPTIONS.

## 2019-03-18 NOTE — Telephone Encounter (Addendum)
Patient wants to know if she can get meds filled today, its only 2 days early. Also says she would come in today for a procedure if Dr. Andree Elk would do one. Wants Nurse to call her to discuss what is happening.   Also she needs to schedule an appt for December

## 2019-03-30 DIAGNOSIS — J449 Chronic obstructive pulmonary disease, unspecified: Secondary | ICD-10-CM | POA: Diagnosis not present

## 2019-03-30 DIAGNOSIS — G4733 Obstructive sleep apnea (adult) (pediatric): Secondary | ICD-10-CM | POA: Diagnosis not present

## 2019-04-08 ENCOUNTER — Ambulatory Visit: Payer: Medicare Other | Admitting: Family Medicine

## 2019-04-15 ENCOUNTER — Encounter: Payer: Medicare Other | Admitting: Anesthesiology

## 2019-04-18 ENCOUNTER — Ambulatory Visit (HOSPITAL_BASED_OUTPATIENT_CLINIC_OR_DEPARTMENT_OTHER): Payer: Medicare Other | Admitting: Anesthesiology

## 2019-04-18 ENCOUNTER — Ambulatory Visit
Admission: RE | Admit: 2019-04-18 | Discharge: 2019-04-18 | Disposition: A | Discharge status: Home / Self Care | Payer: Medicare Other | Source: Ambulatory Visit | Attending: Anesthesiology | Admitting: Anesthesiology

## 2019-04-18 ENCOUNTER — Encounter: Payer: Self-pay | Admitting: Anesthesiology

## 2019-04-18 ENCOUNTER — Other Ambulatory Visit: Payer: Self-pay | Admitting: Anesthesiology

## 2019-04-18 ENCOUNTER — Other Ambulatory Visit: Payer: Self-pay

## 2019-04-18 VITALS — BP 130/110 | HR 89 | Temp 98.2°F | Resp 17 | Ht 64.0 in | Wt 244.0 lb

## 2019-04-18 DIAGNOSIS — M5432 Sciatica, left side: Secondary | ICD-10-CM | POA: Diagnosis not present

## 2019-04-18 DIAGNOSIS — F1190 Opioid use, unspecified, uncomplicated: Secondary | ICD-10-CM | POA: Insufficient documentation

## 2019-04-18 DIAGNOSIS — M5431 Sciatica, right side: Secondary | ICD-10-CM | POA: Diagnosis not present

## 2019-04-18 DIAGNOSIS — M25551 Pain in right hip: Secondary | ICD-10-CM | POA: Insufficient documentation

## 2019-04-18 DIAGNOSIS — M545 Low back pain, unspecified: Secondary | ICD-10-CM

## 2019-04-18 DIAGNOSIS — G894 Chronic pain syndrome: Secondary | ICD-10-CM

## 2019-04-18 DIAGNOSIS — R52 Pain, unspecified: Secondary | ICD-10-CM

## 2019-04-18 DIAGNOSIS — M542 Cervicalgia: Secondary | ICD-10-CM | POA: Diagnosis not present

## 2019-04-18 DIAGNOSIS — M5136 Other intervertebral disc degeneration, lumbar region: Secondary | ICD-10-CM | POA: Diagnosis not present

## 2019-04-18 DIAGNOSIS — M51369 Other intervertebral disc degeneration, lumbar region without mention of lumbar back pain or lower extremity pain: Secondary | ICD-10-CM

## 2019-04-18 MED ORDER — TRIAMCINOLONE ACETONIDE 40 MG/ML IJ SUSP
40.0000 mg | Freq: Once | INTRAMUSCULAR | Status: AC
  Administered 2019-04-18: 40 mg

## 2019-04-18 MED ORDER — OXYCODONE HCL 10 MG PO TABS: 10.0000 mg | ORAL_TABLET | Freq: Four times a day (QID) | ORAL | 0 refills | Status: AC

## 2019-04-18 MED ORDER — IOPAMIDOL (ISOVUE-M 200) INJECTION 41%
20.0000 mL | Freq: Once | INTRAMUSCULAR | Status: DC | PRN
  Administered 2019-04-18: 20 mL

## 2019-04-18 MED ORDER — ROPIVACAINE HCL 2 MG/ML IJ SOLN
10.0000 mL | Freq: Once | INTRAMUSCULAR | Status: AC
  Administered 2019-04-18: 10 mL via EPIDURAL

## 2019-04-18 MED ORDER — LIDOCAINE HCL (PF) 1 % IJ SOLN
5.0000 mL | Freq: Once | INTRAMUSCULAR | Status: AC
  Administered 2019-04-18: 5 mL via SUBCUTANEOUS

## 2019-04-18 MED ORDER — LIDOCAINE HCL (PF) 1 % IJ SOLN
INTRAMUSCULAR | Status: AC
  Filled 2019-04-18: qty 5

## 2019-04-18 MED ORDER — TRIAMCINOLONE ACETONIDE 40 MG/ML IJ SUSP
INTRAMUSCULAR | Status: AC
  Filled 2019-04-18: qty 1

## 2019-04-18 MED ORDER — SODIUM CHLORIDE (PF) 0.9 % IJ SOLN
INTRAMUSCULAR | Status: AC
  Filled 2019-04-18: qty 10

## 2019-04-18 MED ORDER — ROPIVACAINE HCL 2 MG/ML IJ SOLN
INTRAMUSCULAR | Status: AC
  Filled 2019-04-18: qty 10

## 2019-04-18 MED ORDER — SODIUM CHLORIDE 0.9% FLUSH
10.0000 mL | Freq: Once | INTRAVENOUS | Status: AC
  Administered 2019-04-18: 10 mL

## 2019-04-18 NOTE — Progress Notes (Signed)
Safety precautions to be maintained throughout the outpatient stay will include: orient to surroundings, keep bed in low position, maintain call bell within reach at all times, provide assistance with transfer out of bed and ambulation.  To recovery room per Dr Andree Elk to lie on left side for 10 minutes.  Denies headaches or any complaints.

## 2019-04-25 ENCOUNTER — Other Ambulatory Visit: Payer: Self-pay | Admitting: Family Medicine

## 2019-04-25 DIAGNOSIS — J449 Chronic obstructive pulmonary disease, unspecified: Secondary | ICD-10-CM

## 2019-04-25 DIAGNOSIS — K219 Gastro-esophageal reflux disease without esophagitis: Secondary | ICD-10-CM

## 2019-05-15 ENCOUNTER — Telehealth: Payer: Self-pay | Admitting: *Deleted

## 2019-05-15 NOTE — Telephone Encounter (Signed)
Script is dated correctly to fill 05/18/19. Patient called and informed. Verified with Pharmacy.

## 2019-05-29 DIAGNOSIS — M19011 Primary osteoarthritis, right shoulder: Secondary | ICD-10-CM | POA: Diagnosis not present

## 2019-05-29 DIAGNOSIS — M7061 Trochanteric bursitis, right hip: Secondary | ICD-10-CM | POA: Diagnosis not present

## 2019-05-29 DIAGNOSIS — M75101 Unspecified rotator cuff tear or rupture of right shoulder, not specified as traumatic: Secondary | ICD-10-CM | POA: Diagnosis not present

## 2019-05-30 DIAGNOSIS — J449 Chronic obstructive pulmonary disease, unspecified: Secondary | ICD-10-CM | POA: Diagnosis not present

## 2019-06-04 ENCOUNTER — Ambulatory Visit (INDEPENDENT_AMBULATORY_CARE_PROVIDER_SITE_OTHER): Payer: Medicare Other | Admitting: Family Medicine

## 2019-06-04 ENCOUNTER — Other Ambulatory Visit: Payer: Self-pay

## 2019-06-04 ENCOUNTER — Encounter: Payer: Self-pay | Admitting: Family Medicine

## 2019-06-04 VITALS — BP 160/96 | HR 93 | Temp 97.1°F | Resp 16 | Ht 64.0 in | Wt 253.9 lb

## 2019-06-04 DIAGNOSIS — E785 Hyperlipidemia, unspecified: Secondary | ICD-10-CM

## 2019-06-04 DIAGNOSIS — E1169 Type 2 diabetes mellitus with other specified complication: Secondary | ICD-10-CM | POA: Diagnosis not present

## 2019-06-04 DIAGNOSIS — I10 Essential (primary) hypertension: Secondary | ICD-10-CM

## 2019-06-04 DIAGNOSIS — Z79899 Other long term (current) drug therapy: Secondary | ICD-10-CM

## 2019-06-04 DIAGNOSIS — E669 Obesity, unspecified: Secondary | ICD-10-CM

## 2019-06-04 DIAGNOSIS — F1021 Alcohol dependence, in remission: Secondary | ICD-10-CM | POA: Diagnosis not present

## 2019-06-04 DIAGNOSIS — Z6841 Body Mass Index (BMI) 40.0 and over, adult: Secondary | ICD-10-CM

## 2019-06-04 DIAGNOSIS — G894 Chronic pain syndrome: Secondary | ICD-10-CM

## 2019-06-04 DIAGNOSIS — J449 Chronic obstructive pulmonary disease, unspecified: Secondary | ICD-10-CM

## 2019-06-04 MED ORDER — ATORVASTATIN CALCIUM 40 MG PO TABS: 40.0000 mg | ORAL_TABLET | Freq: Every day | ORAL | 1 refills | Status: DC

## 2019-06-04 MED ORDER — HYDRALAZINE HCL 10 MG PO TABS: 10.0000 mg | ORAL_TABLET | Freq: Three times a day (TID) | ORAL | 0 refills | Status: DC
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
## 2019-06-05 LAB — MICROALBUMIN / CREATININE URINE RATIO
Creatinine, Urine: 13 mg/dL — ABNORMAL LOW (ref 20–275)
Microalb Creat Ratio: 15 mcg/mg creat (ref <30)
Microalb, Ur: 0.2 mg/dL

## 2019-06-05 LAB — CBC WITH DIFFERENTIAL/PLATELET
Absolute Monocytes: 678 cells/uL (ref 200–950)
Basophils Absolute: 42 cells/uL (ref 0–200)
Basophils Relative: 0.4 %
Eosinophils Absolute: 32 cells/uL (ref 15–500)
Eosinophils Relative: 0.3 %
HCT: 46.9 % — ABNORMAL HIGH (ref 35.0–45.0)
Hemoglobin: 15.3 g/dL (ref 11.7–15.5)
Lymphs Abs: 1919 cells/uL (ref 850–3900)
MCH: 28.4 pg (ref 27.0–33.0)
MCHC: 32.6 g/dL (ref 32.0–36.0)
MCV: 87.2 fL (ref 80.0–100.0)
MPV: 10.9 fL (ref 7.5–12.5)
Monocytes Relative: 6.4 %
Neutro Abs: 7929 cells/uL — ABNORMAL HIGH (ref 1500–7800)
Neutrophils Relative %: 74.8 %
Platelets: 291 10*3/uL (ref 140–400)
RBC: 5.38 10*6/uL — ABNORMAL HIGH (ref 3.80–5.10)
RDW: 13.9 % (ref 11.0–15.0)
Total Lymphocyte: 18.1 %
WBC: 10.6 10*3/uL (ref 3.8–10.8)

## 2019-06-05 LAB — COMPLETE METABOLIC PANEL WITH GFR
AG Ratio: 1.7 (calc) (ref 1.0–2.5)
ALT: 11 U/L (ref 6–29)
AST: 10 U/L (ref 10–35)
Albumin: 4.3 g/dL (ref 3.6–5.1)
Alkaline phosphatase (APISO): 75 U/L (ref 37–153)
BUN: 8 mg/dL (ref 7–25)
CO2: 26 mmol/L (ref 20–32)
Calcium: 9.6 mg/dL (ref 8.6–10.4)
Chloride: 104 mmol/L (ref 98–110)
Creat: 0.65 mg/dL (ref 0.50–1.05)
GFR, Est African American: 119 mL/min/{1.73_m2} (ref >=60)
GFR, Est Non African American: 103 mL/min/{1.73_m2} (ref >=60)
Globulin: 2.5 g/dL (calc) (ref 1.9–3.7)
Glucose, Bld: 92 mg/dL (ref 65–99)
Potassium: 4.4 mmol/L (ref 3.5–5.3)
Sodium: 139 mmol/L (ref 135–146)
Total Bilirubin: 0.2 mg/dL (ref 0.2–1.2)
Total Protein: 6.8 g/dL (ref 6.1–8.1)

## 2019-06-05 LAB — LIPID PANEL
Cholesterol: 175 mg/dL (ref <200)
HDL: 38 mg/dL — ABNORMAL LOW (ref >=50)
LDL Cholesterol (Calc): 106 mg/dL (calc) — ABNORMAL HIGH
Non-HDL Cholesterol (Calc): 137 mg/dL (calc) — ABNORMAL HIGH (ref <130)
Total CHOL/HDL Ratio: 4.6 (calc) (ref <5.0)
Triglycerides: 194 mg/dL — ABNORMAL HIGH (ref <150)

## 2019-06-05 LAB — HEMOGLOBIN A1C
Hgb A1c MFr Bld: 5.3 % of total Hgb (ref <5.7)
Mean Plasma Glucose: 105 (calc)
eAG (mmol/L): 5.8 (calc)

## 2019-06-18 ENCOUNTER — Ambulatory Visit (HOSPITAL_BASED_OUTPATIENT_CLINIC_OR_DEPARTMENT_OTHER): Payer: Medicare Other | Admitting: Anesthesiology

## 2019-06-18 ENCOUNTER — Ambulatory Visit: Payer: Medicare Other | Admitting: Family Medicine

## 2019-06-18 ENCOUNTER — Ambulatory Visit (INDEPENDENT_AMBULATORY_CARE_PROVIDER_SITE_OTHER): Payer: Medicare Other | Admitting: Family Medicine

## 2019-06-18 ENCOUNTER — Encounter: Payer: Self-pay | Admitting: Family Medicine

## 2019-06-18 ENCOUNTER — Encounter: Payer: Self-pay | Admitting: Anesthesiology

## 2019-06-18 ENCOUNTER — Ambulatory Visit
Admission: RE | Admit: 2019-06-18 | Discharge: 2019-06-18 | Disposition: A | Discharge status: Home / Self Care | Payer: Medicare Other | Source: Ambulatory Visit | Attending: Anesthesiology | Admitting: Anesthesiology

## 2019-06-18 ENCOUNTER — Other Ambulatory Visit: Payer: Self-pay | Admitting: Anesthesiology

## 2019-06-18 ENCOUNTER — Other Ambulatory Visit: Payer: Self-pay

## 2019-06-18 VITALS — BP 142/90 | HR 81 | Temp 97.9°F | Resp 22 | Ht 64.0 in | Wt 242.0 lb

## 2019-06-18 DIAGNOSIS — F1190 Opioid use, unspecified, uncomplicated: Secondary | ICD-10-CM | POA: Insufficient documentation

## 2019-06-18 DIAGNOSIS — M25551 Pain in right hip: Secondary | ICD-10-CM | POA: Diagnosis not present

## 2019-06-18 DIAGNOSIS — M5431 Sciatica, right side: Secondary | ICD-10-CM | POA: Diagnosis not present

## 2019-06-18 DIAGNOSIS — M5432 Sciatica, left side: Secondary | ICD-10-CM | POA: Insufficient documentation

## 2019-06-18 DIAGNOSIS — M25552 Pain in left hip: Secondary | ICD-10-CM | POA: Diagnosis not present

## 2019-06-18 DIAGNOSIS — M542 Cervicalgia: Secondary | ICD-10-CM | POA: Insufficient documentation

## 2019-06-18 DIAGNOSIS — R1319 Other dysphagia: Secondary | ICD-10-CM

## 2019-06-18 DIAGNOSIS — M5136 Other intervertebral disc degeneration, lumbar region: Secondary | ICD-10-CM | POA: Insufficient documentation

## 2019-06-18 DIAGNOSIS — R1310 Dysphagia, unspecified: Secondary | ICD-10-CM | POA: Diagnosis not present

## 2019-06-18 DIAGNOSIS — M545 Low back pain, unspecified: Secondary | ICD-10-CM

## 2019-06-18 DIAGNOSIS — R52 Pain, unspecified: Secondary | ICD-10-CM | POA: Diagnosis not present

## 2019-06-18 DIAGNOSIS — G894 Chronic pain syndrome: Secondary | ICD-10-CM | POA: Diagnosis not present

## 2019-06-18 DIAGNOSIS — K219 Gastro-esophageal reflux disease without esophagitis: Secondary | ICD-10-CM | POA: Diagnosis not present

## 2019-06-18 DIAGNOSIS — M51369 Other intervertebral disc degeneration, lumbar region without mention of lumbar back pain or lower extremity pain: Secondary | ICD-10-CM

## 2019-06-18 MED ORDER — LIDOCAINE HCL (PF) 1 % IJ SOLN
INTRAMUSCULAR | Status: AC
  Filled 2019-06-18: qty 5

## 2019-06-18 MED ORDER — ESOMEPRAZOLE MAGNESIUM 40 MG PO CPDR: 40.0000 mg | DELAYED_RELEASE_CAPSULE | Freq: Two times a day (BID) | ORAL | 0 refills | Status: DC

## 2019-06-18 MED ORDER — ROPIVACAINE HCL 2 MG/ML IJ SOLN
10.0000 mL | Freq: Once | INTRAMUSCULAR | Status: AC
  Administered 2019-06-18: 10 mL via EPIDURAL

## 2019-06-18 MED ORDER — ROPIVACAINE HCL 2 MG/ML IJ SOLN
INTRAMUSCULAR | Status: AC
  Filled 2019-06-18: qty 10

## 2019-06-18 MED ORDER — TRIAMCINOLONE ACETONIDE 40 MG/ML IJ SUSP
INTRAMUSCULAR | Status: AC
  Filled 2019-06-18: qty 1

## 2019-06-18 MED ORDER — OXYCODONE HCL 10 MG PO TABS: 10.0000 mg | ORAL_TABLET | Freq: Every day | ORAL | 0 refills | Status: AC | PRN

## 2019-06-18 MED ORDER — IOHEXOL 180 MG/ML  SOLN
INTRAMUSCULAR | Status: AC
  Filled 2019-06-18: qty 20

## 2019-06-18 MED ORDER — LACTATED RINGERS IV SOLN: 1000.0000 mL | INTRAVENOUS | Status: DC

## 2019-06-18 MED ORDER — LIDOCAINE HCL (PF) 1 % IJ SOLN
5.0000 mL | Freq: Once | INTRAMUSCULAR | Status: AC
  Administered 2019-06-18: 5 mL via SUBCUTANEOUS

## 2019-06-18 MED ORDER — SODIUM CHLORIDE 0.9% FLUSH
10.0000 mL | Freq: Once | INTRAVENOUS | Status: AC
  Administered 2019-06-18: 14:00:00 10 mL

## 2019-06-18 MED ORDER — SODIUM CHLORIDE (PF) 0.9 % IJ SOLN
INTRAMUSCULAR | Status: AC
  Filled 2019-06-18: qty 10

## 2019-06-18 MED ORDER — IOPAMIDOL (ISOVUE-M 200) INJECTION 41%: 20.0000 mL | Freq: Once | INTRAMUSCULAR | Status: DC | PRN

## 2019-06-18 MED ORDER — OXYCODONE HCL 10 MG PO TABS: 10.0000 mg | ORAL_TABLET | Freq: Four times a day (QID) | ORAL | 0 refills | Status: DC

## 2019-06-18 MED ORDER — TRIAMCINOLONE ACETONIDE 40 MG/ML IJ SUSP
40.0000 mg | Freq: Once | INTRAMUSCULAR | Status: AC
  Administered 2019-06-18: 14:00:00 40 mg

## 2019-06-18 NOTE — Progress Notes (Signed)
Subjective:  Patient ID: Wendy Johnson, female    DOB: [birth year]  Age: 54 y.o. MRN: [id number]  CC: Back Pain (low)   Procedure: L5-S1 epidural steroid and fluoroscopic guidance without sedation  HPI Wendy Johnson presents for reevaluation.  She was last seen a few months ago and has had an epidural back in December and October of last year.  She generally gets approximately 2 months of 7580% reduction in her low back pain and bilateral lower calf pain.  She still has some chronic hip pain following previous arthroplasty.  No change in symptom quality is noted.  Her bowel and bladder function has been stable and her lower extremity strength is been stable.  She occasionally has some give way weakness but this is also been consistent with previous experience.  She takes her medications as prescribed and these work well to keep her pain under good control.  She has been on chronic opioids for considerable period of time with no side effects and good relief reported.  She has had more shoulder pain as of recent and is trying to get through some physical therapy to help rehabilitate her right shoulder that has been followed by her orthopedist.  Outpatient Medications Prior to Visit  Medication Sig Dispense Refill  . albuterol (PROVENTIL) (2.5 MG/3ML) 0.083% nebulizer solution Take 3 mLs (2.5 mg total) by nebulization every 6 (six) hours as needed for wheezing or shortness of breath. 75 mL 1  . amphetamine-dextroamphetamine (ADDERALL) 15 MG tablet Take 15 mg by mouth daily.     Marland Kitchen amphetamine-dextroamphetamine (ADDERALL) 30 MG tablet Take 30 mg by mouth 2 (two) times daily.    Marland Kitchen atorvastatin (LIPITOR) 40 MG tablet Take 1 tablet (40 mg total) by mouth daily. 90 tablet 1  . busPIRone (BUSPAR) 30 MG tablet Take 30 mg by mouth 2 (two) times daily.     . clonazePAM (KLONOPIN) 1 MG tablet Take 1 mg by mouth 6 (six) times daily.     . cyclobenzaprine (FLEXERIL) 10 MG tablet Take 10 mg by mouth 3 (three)  times daily as needed for muscle spasms.    . DEXILANT 60 MG capsule TAKE 1 CAPSULE BY MOUTH DAILY 90 capsule 0  . hydrALAZINE (APRESOLINE) 10 MG tablet Take 1 tablet (10 mg total) by mouth 3 (three) times daily. Prn bp above 140/90 90 tablet 0  . hydrOXYzine (ATARAX/VISTARIL) 25 MG tablet Take 25 mg by mouth every 6 (six) hours as needed for anxiety or itching (hives).    Marland Kitchen ibuprofen (ADVIL) 800 MG tablet Take 1 tablet (800 mg total) by mouth every 8 (eight) hours as needed. 30 tablet 1  . LATUDA 40 MG TABS tablet Take 40 mg by mouth daily with breakfast.     . omega-3 acid ethyl esters (LOVAZA) 1 g capsule TAKE TWO CAPSULES BY MOUTH TWICE A DAY 360 capsule 0  . PROAIR HFA 108 (90 Base) MCG/ACT inhaler Inhale 2 puffs into the lungs every 4 (four) hours as needed for wheezing or shortness of breath. 8.5 g 0  . sertraline (ZOLOFT) 100 MG tablet Take 100 mg by mouth 2 (two) times daily.    Marland Kitchen SPIRIVA HANDIHALER 18 MCG inhalation capsule PLACE 1 CAPSULE INTO INHALER AND INHALE DAILY 30 capsule 5  . traZODone (DESYREL) 100 MG tablet Take 400 mg by mouth at bedtime.      No facility-administered medications prior to visit.    Review of Systems CNS: No confusion or sedation Cardiac:  No angina or palpitations GI: No abdominal pain or constipation Constitutional: No nausea vomiting fevers or chills  Objective:  BP (!) 147/98   Pulse 81   Temp 97.9 F (36.6 C)   Resp (!) 27   Ht 5\' 4"  (1.626 m)   Wt 242 lb (109.8 kg)   LMP 09/26/2014 Comment: Total  SpO2 96%   BMI 41.54 kg/m    BP Readings from Last 3 Encounters:  06/18/19 (!) 147/98  06/04/19 (!) 160/96  04/18/19 (!) 130/110     Wt Readings from Last 3 Encounters:  06/18/19 242 lb (109.8 kg)  06/04/19 253 lb 14.4 oz (115.2 kg)  04/18/19 244 lb (110.7 kg)     Physical Exam Pt is alert and oriented PERRL EOMI HEART IS RRR no murmur or rub LCTA no wheezing or rales MUSCULOSKELETAL reveals some paraspinous muscle tenderness  but no overt trigger points.  She is ambulating with an antalgic gait has a positive straight leg raise right side equivocal on the left but her muscle tone and bulk is good she does have some pain with rotational movement in the right glenohumeral joint.  Labs  Lab Results  Component Value Date   HGBA1C 5.3 06/04/2019   HGBA1C 5.3 10/04/2018   HGBA1C 5.5 01/22/2018   Lab Results  Component Value Date   MICROALBUR 0.2 06/04/2019   LDLCALC 106 (H) 06/04/2019   CREATININE 0.65 06/04/2019    -------------------------------------------------------------------------------------------------------------------- Lab Results  Component Value Date   WBC 10.6 06/04/2019   HGB 15.3 06/04/2019   HCT 46.9 (H) 06/04/2019   PLT 291 06/04/2019   GLUCOSE 92 06/04/2019   CHOL 175 06/04/2019   TRIG 194 (H) 06/04/2019   HDL 38 (L) 06/04/2019   LDLCALC 106 (H) 06/04/2019   ALT 11 06/04/2019   AST 10 06/04/2019   NA 139 06/04/2019   K 4.4 06/04/2019   CL 104 06/04/2019   CREATININE 0.65 06/04/2019   BUN 8 06/04/2019   CO2 26 06/04/2019   TSH 0.769 02/03/2016   INR 0.97 10/04/2016   HGBA1C 5.3 06/04/2019   MICROALBUR 0.2 06/04/2019    --------------------------------------------------------------------------------------------------------------------- DG PAIN CLINIC C-ARM 1-60 MIN NO REPORT  Result Date: 06/18/2019 Fluoro was used, but no Radiologist interpretation will be provided. Please refer to "NOTES" tab for provider progress note.    Assessment & Plan:   Wendy Johnson was seen today for back pain.  Diagnoses and all orders for this visit:  DDD (degenerative disc disease), lumbar  Low back pain at multiple sites  Chronic, continuous use of opioids  Chronic pain syndrome  Sciatica, right side  Sciatica of left side  Pain of right hip joint  Pain of both hip joints  Cervicalgia  Other orders -     triamcinolone acetonide (KENALOG-40) injection 40 mg -     sodium chloride  flush (NS) 0.9 % injection 10 mL -     ropivacaine (PF) 2 mg/mL (0.2%) (NAROPIN) injection 10 mL -     lidocaine (PF) (XYLOCAINE) 1 % injection 5 mL -     lactated ringers infusion 1,000 mL -     iopamidol (ISOVUE-M) 41 % intrathecal injection 20 mL -     Oxycodone HCl 10 MG TABS; Take 1 tablet (10 mg total) by mouth 5 (five) times daily as needed. -     Oxycodone HCl 10 MG TABS; Take 1 tablet (10 mg total) by mouth 4 (four) times daily.        ----------------------------------------------------------------------------------------------------------------------  Problem List Items Addressed This Visit      Unprioritized   DDD (degenerative disc disease), lumbar - Primary   Relevant Medications   triamcinolone acetonide (KENALOG-40) injection 40 mg   Oxycodone HCl 10 MG TABS   Oxycodone HCl 10 MG TABS (Start on 07/17/2019)   Sciatica, right side    Other Visit Diagnoses    Low back pain at multiple sites       Relevant Medications   triamcinolone acetonide (KENALOG-40) injection 40 mg   Oxycodone HCl 10 MG TABS   Oxycodone HCl 10 MG TABS (Start on 07/17/2019)   Chronic, continuous use of opioids       Chronic pain syndrome       Relevant Medications   triamcinolone acetonide (KENALOG-40) injection 40 mg   ropivacaine (PF) 2 mg/mL (0.2%) (NAROPIN) injection 10 mL   lidocaine (PF) (XYLOCAINE) 1 % injection 5 mL   Oxycodone HCl 10 MG TABS   Oxycodone HCl 10 MG TABS (Start on 07/17/2019)   Sciatica of left side       Pain of right hip joint       Pain of both hip joints       Cervicalgia            ----------------------------------------------------------------------------------------------------------------------  1. DDD (degenerative disc disease), lumbar Based on findings today I think it is reasonable to proceed with a repeat epidural.  She has done well with these in the past and generally gets good relief where she has found it difficult management under conservative  care.  We will proceed with a repeat epidural today the risks and benefits of been reviewed.  We will have her return to clinic in 2 months.  I want her to continue with back stretching strengthening exercises and efforts at weight loss.  She mentions that she is back to working out at [employer] and she is optimistic that this will be helpful.  2. Low back pain at multiple sites Continue core stretching strengthening exercises  3. Chronic, continuous use of opioids I have reviewed the [facility] practitioner database information and it is appropriate.  Normal she is on 4 times daily oxycodone 10 mg tablets.  I am going to allow her to increase to 1/5 tablet/day randomly when she does her physical therapy for the next month.  This will be for 130 tablets for the February refill dated February 8 and eight 120 tablet refill dated March 9  4. Chronic pain syndrome As above  5. Sciatica, right side Epidural today and continue with core stretching strengthening exercises  6. Sciatica of left side As above  7. Pain of right hip joint Continue follow-up with orthopedics  8. Pain of both hip joints   9. Cervicalgia     ----------------------------------------------------------------------------------------------------------------------  I am having Wendy Johnson. Wendy Johnson "[name]" start on Oxycodone HCl and Oxycodone HCl. I am also having her maintain her amphetamine-dextroamphetamine, busPIRone, sertraline, hydrOXYzine, clonazePAM, traZODone, albuterol, Latuda, amphetamine-dextroamphetamine, ProAir HFA, cyclobenzaprine, ibuprofen, omega-3 acid ethyl esters, Dexilant, Spiriva HandiHaler, atorvastatin, and hydrALAZINE.   Meds ordered this encounter  Medications  . triamcinolone acetonide (KENALOG-40) injection 40 mg  . sodium chloride flush (NS) 0.9 % injection 10 mL  . ropivacaine (PF) 2 mg/mL (0.2%) (NAROPIN) injection 10 mL  . lidocaine (PF) (XYLOCAINE) 1 % injection 5 mL  . lactated ringers  infusion 1,000 mL  . iopamidol (ISOVUE-M) 41 % intrathecal injection 20 mL  . Oxycodone HCl 10 MG TABS  Sig: Take 1 tablet (10 mg total) by mouth 5 (five) times daily as needed.    Dispense:  130 tablet    Refill:  0  . Oxycodone HCl 10 MG TABS    Sig: Take 1 tablet (10 mg total) by mouth 4 (four) times daily.    Dispense:  120 tablet    Refill:  0   Patient's Medications  New Prescriptions   OXYCODONE HCL 10 MG TABS    Take 1 tablet (10 mg total) by mouth 5 (five) times daily as needed.   OXYCODONE HCL 10 MG TABS    Take 1 tablet (10 mg total) by mouth 4 (four) times daily.  Previous Medications   ALBUTEROL (PROVENTIL) (2.5 MG/3ML) 0.083% NEBULIZER SOLUTION    Take 3 mLs (2.5 mg total) by nebulization every 6 (six) hours as needed for wheezing or shortness of breath.   AMPHETAMINE-DEXTROAMPHETAMINE (ADDERALL) 15 MG TABLET    Take 15 mg by mouth daily.    AMPHETAMINE-DEXTROAMPHETAMINE (ADDERALL) 30 MG TABLET    Take 30 mg by mouth 2 (two) times daily.   ATORVASTATIN (LIPITOR) 40 MG TABLET    Take 1 tablet (40 mg total) by mouth daily.   BUSPIRONE (BUSPAR) 30 MG TABLET    Take 30 mg by mouth 2 (two) times daily.    CLONAZEPAM (KLONOPIN) 1 MG TABLET    Take 1 mg by mouth 6 (six) times daily.    CYCLOBENZAPRINE (FLEXERIL) 10 MG TABLET    Take 10 mg by mouth 3 (three) times daily as needed for muscle spasms.   DEXILANT 60 MG CAPSULE    TAKE 1 CAPSULE BY MOUTH DAILY   HYDRALAZINE (APRESOLINE) 10 MG TABLET    Take 1 tablet (10 mg total) by mouth 3 (three) times daily. Prn bp above 140/90   HYDROXYZINE (ATARAX/VISTARIL) 25 MG TABLET    Take 25 mg by mouth every 6 (six) hours as needed for anxiety or itching (hives).   IBUPROFEN (ADVIL) 800 MG TABLET    Take 1 tablet (800 mg total) by mouth every 8 (eight) hours as needed.   LATUDA 40 MG TABS TABLET    Take 40 mg by mouth daily with breakfast.    OMEGA-3 ACID ETHYL ESTERS (LOVAZA) 1 G CAPSULE    TAKE TWO CAPSULES BY MOUTH TWICE A DAY   PROAIR  HFA 108 (90 BASE) MCG/ACT INHALER    Inhale 2 puffs into the lungs every 4 (four) hours as needed for wheezing or shortness of breath.   SERTRALINE (ZOLOFT) 100 MG TABLET    Take 100 mg by mouth 2 (two) times daily.   SPIRIVA HANDIHALER 18 MCG INHALATION CAPSULE    PLACE 1 CAPSULE INTO INHALER AND INHALE DAILY   TRAZODONE (DESYREL) 100 MG TABLET    Take 400 mg by mouth at bedtime.   Modified Medications   No medications on file  Discontinued Medications   No medications on file   ----------------------------------------------------------------------------------------------------------------------  Follow-up: Return in about 2 months (around 08/16/2019) for evaluation, med refill.   Procedure: L5-S1 LESI with fluoroscopic guidance and no moderate sedation  NOTE: The risks, benefits, and expectations of the procedure have been discussed and explained to the patient who was understanding and in agreement with suggested treatment plan. No guarantees were made.  DESCRIPTION OF PROCEDURE: Lumbar epidural steroid injection with no IV Versed, EKG, blood pressure, pulse, and pulse oximetry monitoring. The procedure was performed with the patient in the prone position under fluoroscopic  guidance.  Sterile prep x3 was initiated and I then injected subcutaneous lidocaine to the overlying L5-S1 site after its fluoroscopic identifictation.  Using strict aseptic technique, I then advanced an 18-gauge Tuohy epidural needle in the midline using interlaminar approach via loss-of-resistance to saline technique. There was negative aspiration for heme or  CSF.  I then confirmed position with both AP and Lateral fluoroscan.  2 cc of contrast dye were injected and a  total of 5 mL of Preservative-Free normal saline mixed with 40 mg of Kenalog and 1cc Ropicaine 0.2 percent were injected incrementally via the  epidurally placed needle. The needle was removed. The patient tolerated the injection well and was convalesced and  discharged to home in stable condition. Should the patient have any post procedure difficulty they have been instructed on how to contact us for assistance.    Molli Barrows, MD

## 2019-06-18 NOTE — Progress Notes (Signed)
Name: Wendy Johnson   MRN: [id number]    DOB: 03-10-1966   Date:06/18/2019       Progress Note  Subjective  Chief Complaint  Chief Complaint  Patient presents with  . Sore Throat    Soreness x 6 months, she is unable to swallow sometimes. She has been taking Dexilant with no improvement.    I connected with  Delissa Zoerb Curiale on 06/18/19 at  3:00 PM EST by telephone and verified that I am speaking with the correct person using two identifiers.  I discussed the limitations, risks, security and privacy concerns of performing an evaluation and management service by telephone and the availability of in person appointments. Staff also discussed with the patient that there may be a patient responsible charge related to this service. Patient Location: she is parked at sister house Provider Location: [facility]   HPI  GERD: she states that for the past 6 months she has a constant sore throat but is worse when she first wakes up. She has noticed worsening of her regurgitation. She states not dysphagia with food or just with saliva , feels like it cannot go down. She has lost 9 lbs in the past couple of weeks. She denies nausea or vomiting. She is very concerned because her grandmother died of throat cancer and she is very concerned. She states she has been doubling dose of Dexilant and is almost out of medication. Explained I will change to nexium BID and she needs to follow up with GI   Patient Active Problem List   Diagnosis Date Noted  . Sciatica, right side 02/18/2019  . Pain in joint, shoulder region 12/19/2018  . Unspecified inflammatory spondylopathy, lumbar region ([place]) 10/04/2018  . BMI 40.0-44.9, adult ([place]) 08/17/2018  . Lumbar spondylosis 03/19/2018  . Polyarthralgia 03/19/2018  . History of alcoholism ([place]) 07/19/2017  . Bilateral carpal tunnel syndrome 05/29/2017  . Painful total knee replacement, right ([place]) 10/11/2016  . Rotator cuff tendinitis, right  07/29/2016  . Occipital neuralgia 10/12/2015  . Instability of prosthetic knee ([place]) 09/22/2015  . Primary osteoarthritis of right hip 05/12/2015  . Sleep apnea 04/07/2015  . Primary osteoarthritis of left hip 12/16/2014  . Metabolic syndrome [id number]  . Migraine without aura and without status migrainosus, not intractable 11/26/2014  . GERD without esophagitis 11/26/2014  . COPD, moderate ([place]) 11/26/2014  . Nocturnal oxygen desaturation 11/26/2014  . Supplemental oxygen dependent 11/26/2014  . Hearing loss 11/26/2014  . Pain syndrome, chronic 11/26/2014  . History of hypertension 11/26/2014  . Dyslipidemia 11/26/2014  . Morbid obesity ([place]) 11/26/2014  . Dyslipidemia associated with type 2 diabetes mellitus ([place]) 11/26/2014  . Chronic constipation 11/26/2014  . Generalized anxiety disorder 11/11/2014  . DDD (degenerative disc disease), lumbar 11/11/2014  . Facet arthritis of lumbar region 11/11/2014  . Primary osteoarthritis involving multiple joints 11/11/2014  . H/O hysterectomy for benign disease 10/20/2014  . Low back derangement syndrome 09/30/2014  . Bipolar disorder ([place]) 09/30/2014    Past Surgical History:  Procedure Laterality Date  . ABDOMINAL HYSTERECTOMY N/A 10/20/2014   Procedure: Total abdominial hysterectomy, bilateral salpingo-oophorectomy;  Surgeon: Brayton Mars, MD;  Location: [facility] ORS;  Service: Gynecology;  Laterality: N/A;  . BILATERAL SALPINGOOPHORECTOMY    . bone spurs removed Bilateral   . CARPAL TUNNEL RELEASE Left 06/08/2017   Procedure: CARPAL TUNNEL RELEASE;  Surgeon: Hessie Knows, MD;  Location: [facility];  Service: Orthopedics;  Laterality: Left;  . CARPAL TUNNEL RELEASE  Right 10/10/2017   Procedure: CARPAL TUNNEL RELEASE;  Surgeon: Hessie Knows, MD;  Location: [facility];  Service: Orthopedics;  Laterality: Right;  . COLONOSCOPY WITH PROPOFOL N/A 09/01/2016   Procedure: COLONOSCOPY WITH PROPOFOL;  Surgeon: Jonathon Bellows, MD;  Location: [facility]  ENDOSCOPY;  Service: Endoscopy;  Laterality: N/A;  . DORSAL COMPARTMENT RELEASE Left 06/08/2017   Procedure: RELEASE DORSAL COMPARTMENT (DEQUERVAIN);  Surgeon: Hessie Knows, MD;  Location: [facility] ORS;  Service: Orthopedics;  Laterality: Left;  . ESOPHAGOGASTRODUODENOSCOPY (EGD) WITH PROPOFOL N/A 09/01/2016   Procedure: ESOPHAGOGASTRODUODENOSCOPY (EGD) WITH PROPOFOL;  Surgeon: Jonathon Bellows, MD;  Location: [facility] ENDOSCOPY;  Service: Endoscopy;  Laterality: N/A;  . FOOT SURGERY Bilateral   . INSERTION OF MESH N/A 12/26/2017   Procedure: INSERTION OF MESH;  Surgeon: Jules Husbands, MD;  Location: [facility] ORS;  Service: General;  Laterality: N/A;  . JOINT REPLACEMENT Right    Total Knee replacement X 2  . JOINT REPLACEMENT Bilateral    Total Hip Replacement  . knee arthroscopo Right   . LAPAROSCOPY  09/22/2014   Procedure: LAPAROSCOPY OPERATIVE;  Surgeon: Brayton Mars, MD;  Location: [facility] ORS;  Service: Gynecology;;  excision and fulgeration of endomertriosis  . LIPOMA EXCISION    . ROBOTIC ASSISTED LAPAROSCOPIC VENTRAL/INCISIONAL HERNIA REPAIR N/A 12/26/2017   Procedure: ROBOTIC ASSISTED LAPAROSCOPIC VENTRAL/INCISIONAL HERNIA REPAIR;  Surgeon: Jules Husbands, MD;  Location: [facility] ORS;  Service: General;  Laterality: N/A;  . TONSILLECTOMY    . TOTAL HIP ARTHROPLASTY Left 12/16/2014   Procedure: TOTAL HIP ARTHROPLASTY ANTERIOR APPROACH;  Surgeon: Hessie Knows, MD;  Location: [facility];  Service: Orthopedics;  Laterality: Left;  . TOTAL HIP ARTHROPLASTY Right 05/12/2015   Procedure: TOTAL HIP ARTHROPLASTY ANTERIOR APPROACH;  Surgeon: Hessie Knows, MD;  Location: [facility];  Service: Orthopedics;  Laterality: Right;  . TOTAL KNEE REVISION Right 09/22/2015   Procedure: TOTAL KNEE REVISION/ REVISE POLYIETHYLENE;  Surgeon: Hessie Knows, MD;  Location: [facility];  Service: Orthopedics;  Laterality: Right;  . TOTAL KNEE REVISION Right 10/11/2016   Procedure: TOTAL KNEE REVISION;  Surgeon: Hessie Knows, MD;  Location:  [facility];  Service: Orthopedics;  Laterality: Right;  . TUBAL LIGATION    . VENTRAL HERNIA REPAIR N/A 11/08/2018   Procedure: HERNIA REPAIR VENTRAL ADULT OPEN. DIABETIC, SLEEP APNEA;  Surgeon: Jules Husbands, MD;  Location: [facility] ORS;  Service: General;  Laterality: N/A;    Family History  Problem Relation Age of Onset  . Diabetes Mother   . Heart disease Father   . Heart attack Father   . Diabetes Sister   . Breast cancer Maternal Aunt        <50  . Breast cancer Paternal Aunt        x2.  <50  . Healthy Son   . Drug abuse Sister      Current Outpatient Medications:  .  albuterol (PROVENTIL) (2.5 MG/3ML) 0.083% nebulizer solution, Take 3 mLs (2.5 mg total) by nebulization every 6 (six) hours as needed for wheezing or shortness of breath., Disp: 75 mL, Rfl: 1 .  amphetamine-dextroamphetamine (ADDERALL) 15 MG tablet, Take 15 mg by mouth daily. , Disp: , Rfl:  .  amphetamine-dextroamphetamine (ADDERALL) 30 MG tablet, Take 30 mg by mouth 2 (two) times daily., Disp: , Rfl:  .  atorvastatin (LIPITOR) 40 MG tablet, Take 1 tablet (40 mg total) by mouth daily., Disp: 90 tablet, Rfl: 1 .  busPIRone (BUSPAR) 30 MG tablet, Take 30 mg by mouth 2 (two) times  daily. , Disp: , Rfl:  .  clonazePAM (KLONOPIN) 1 MG tablet, Take 1 mg by mouth 6 (six) times daily. , Disp: , Rfl:  .  cyclobenzaprine (FLEXERIL) 10 MG tablet, Take 10 mg by mouth 3 (three) times daily as needed for muscle spasms., Disp: , Rfl:  .  DEXILANT 60 MG capsule, TAKE 1 CAPSULE BY MOUTH DAILY, Disp: 90 capsule, Rfl: 0 .  hydrALAZINE (APRESOLINE) 10 MG tablet, Take 1 tablet (10 mg total) by mouth 3 (three) times daily. Prn bp above 140/90, Disp: 90 tablet, Rfl: 0 .  hydrOXYzine (ATARAX/VISTARIL) 25 MG tablet, Take 25 mg by mouth every 6 (six) hours as needed for anxiety or itching (hives)., Disp: , Rfl:  .  ibuprofen (ADVIL) 800 MG tablet, Take 1 tablet (800 mg total) by mouth every 8 (eight) hours as needed., Disp: 30 tablet, Rfl: 1 .   LATUDA 40 MG TABS tablet, Take 40 mg by mouth daily with breakfast. , Disp: , Rfl:  .  omega-3 acid ethyl esters (LOVAZA) 1 g capsule, TAKE TWO CAPSULES BY MOUTH TWICE A DAY, Disp: 360 capsule, Rfl: 0 .  Oxycodone HCl 10 MG TABS, Take 1 tablet (10 mg total) by mouth 5 (five) times daily as needed., Disp: 130 tablet, Rfl: 0 .  [START ON 07/17/2019] Oxycodone HCl 10 MG TABS, Take 1 tablet (10 mg total) by mouth 4 (four) times daily., Disp: 120 tablet, Rfl: 0 .  PROAIR HFA 108 (90 Base) MCG/ACT inhaler, Inhale 2 puffs into the lungs every 4 (four) hours as needed for wheezing or shortness of breath., Disp: 8.5 g, Rfl: 0 .  sertraline (ZOLOFT) 100 MG tablet, Take 100 mg by mouth 2 (two) times daily., Disp: , Rfl:  .  SPIRIVA HANDIHALER 18 MCG inhalation capsule, PLACE 1 CAPSULE INTO INHALER AND INHALE DAILY, Disp: 30 capsule, Rfl: 5 .  traZODone (DESYREL) 100 MG tablet, Take 400 mg by mouth at bedtime. , Disp: , Rfl:  No current facility-administered medications for this visit.  Facility-Administered Medications Ordered in Other Visits:  .  iopamidol (ISOVUE-M) 41 % intrathecal injection 20 mL, 20 mL, Other, Once PRN, Molli Barrows, MD .  lactated ringers infusion 1,000 mL, 1,000 mL, Intravenous, Continuous, Andree Elk, Alvina Filbert, MD  Allergies  Allergen Reactions  . Codeine Nausea Only  . Imitrex [Sumatriptan] Other (See Comments)    Headaches    I personally reviewed active problem list, medication list, allergies, family history, social history with the patient/caregiver today.   ROS  Ten systems reviewed and is negative except as mentioned in HPI  She states she has lost 9 lbs in the past couple of weeks  Objective  Virtual encounter, vitals not obtained.  There is no height or weight on file to calculate BMI.  Physical Exam  Awake, alert and oriented Seems a little SOB  PHQ2/9: Depression screen [facility] 2/9 06/18/2019 06/18/2019 06/04/2019 02/18/2019 02/06/2019  Decreased Interest 0 0 0 0 0   Down, Depressed, Hopeless 1 0 0 0 0  PHQ - 2 Score 1 0 0 0 0  Altered sleeping 0 - 0 - 0  Tired, decreased energy 1 - 0 - 0  Change in appetite 0 - 0 - 0  Feeling bad or failure about yourself  0 - 0 - 0  Trouble concentrating 0 - 0 - 0  Moving slowly or fidgety/restless 0 - 0 - 0  Suicidal thoughts 0 - 0 - 0  PHQ-9 Score 2 - 0 -  0  Difficult doing work/chores Not difficult at all - - - Not difficult at all  Some recent data might be hidden   PHQ-2/9 Result is positive.    Fall Risk: Fall Risk  06/18/2019 06/18/2019 06/04/2019 02/18/2019 02/06/2019  Falls in the past year? 0 0 0 0 0  Number falls in past yr: 0 - 0 - 0  Injury with Fall? 0 - 0 - 0  Comment - - - - -  Risk for fall due to : - - - - -  Follow up - - - - -  Comment - - - - -     Assessment & Plan  1. Gastroesophageal reflux disease without esophagitis  - esomeprazole (NEXIUM) 40 MG capsule; Take 1 capsule (40 mg total) by mouth 2 (two) times daily before a meal.  Dispense: 60 capsule; Refill: 0 - Ambulatory referral to Gastroenterology  2. Esophageal dysphagia  - esomeprazole (NEXIUM) 40 MG capsule; Take 1 capsule (40 mg total) by mouth 2 (two) times daily before a meal.  Dispense: 60 capsule; Refill: 0 - Ambulatory referral to Gastroenterology  I discussed the assessment and treatment plan with the patient. The patient was provided an opportunity to ask questions and all were answered. The patient agreed with the plan and demonstrated an understanding of the instructions.   The patient was advised to call back or seek an in-person evaluation if the symptoms worsen or if the condition fails to improve as anticipated.  I provided 15  minutes of non-face-to-face time during this encounter.  Loistine Chance, MD

## 2019-06-18 NOTE — Progress Notes (Signed)
Nursing Pain Medication Assessment:  Safety precautions to be maintained throughout the outpatient stay will include: orient to surroundings, keep bed in low position, maintain call bell within reach at all times, provide assistance with transfer out of bed and ambulation.  Medication Inspection Compliance: Pill count conducted under aseptic conditions, in front of the patient. Neither the pills nor the bottle was removed from the patient's sight at any time. Once count was completed pills were immediately returned to the patient in their original bottle.  Medication: Oxycodone IR Pill/Patch Count: 0 of 120 pills remain Pill/Patch Appearance: Markings consistent with prescribed medication Bottle Appearance: Standard pharmacy container. Clearly labeled. Filled Date: 05/18/2019  Last Medication intake:  Wendy Johnson

## 2019-06-19 ENCOUNTER — Telehealth: Payer: Self-pay | Admitting: *Deleted

## 2019-06-19 NOTE — Telephone Encounter (Signed)
Called patient re; procedure on yesterday, denies any questions or concerns.

## 2019-06-25 ENCOUNTER — Inpatient Hospital Stay: Admission: RE | Admit: 2019-06-25 | Payer: Medicare Other | Source: Ambulatory Visit

## 2019-06-26 ENCOUNTER — Other Ambulatory Visit: Payer: Self-pay | Admitting: Family Medicine

## 2019-06-26 DIAGNOSIS — M2550 Pain in unspecified joint: Secondary | ICD-10-CM

## 2019-06-30 DIAGNOSIS — J449 Chronic obstructive pulmonary disease, unspecified: Secondary | ICD-10-CM | POA: Diagnosis not present

## 2019-07-01 ENCOUNTER — Other Ambulatory Visit: Payer: Self-pay

## 2019-07-01 ENCOUNTER — Encounter: Payer: Self-pay | Admitting: Gastroenterology

## 2019-07-01 ENCOUNTER — Ambulatory Visit (INDEPENDENT_AMBULATORY_CARE_PROVIDER_SITE_OTHER): Payer: Medicare Other | Admitting: Gastroenterology

## 2019-07-01 DIAGNOSIS — Z86010 Personal history of colonic polyps: Secondary | ICD-10-CM

## 2019-07-01 DIAGNOSIS — K219 Gastro-esophageal reflux disease without esophagitis: Secondary | ICD-10-CM

## 2019-07-01 DIAGNOSIS — R1319 Other dysphagia: Secondary | ICD-10-CM

## 2019-07-01 DIAGNOSIS — R1310 Dysphagia, unspecified: Secondary | ICD-10-CM | POA: Diagnosis not present

## 2019-07-01 MED ORDER — NA SULFATE-K SULFATE-MG SULF 17.5-3.13-1.6 GM/177ML PO SOLN: 354.0000 mL | Freq: Once | ORAL | 0 refills | Status: AC

## 2019-07-01 MED ORDER — OMEPRAZOLE 40 MG PO CPDR: 40.0000 mg | DELAYED_RELEASE_CAPSULE | Freq: Two times a day (BID) | ORAL | 1 refills | Status: DC

## 2019-07-01 MED ORDER — NA SULFATE-K SULFATE-MG SULF 17.5-3.13-1.6 GM/177ML PO SOLN: 354.0000 mL | Freq: Once | ORAL | 0 refills | Status: DC

## 2019-07-01 NOTE — H&P (View-Only) (Signed)
Wendy Johnson [address]  [place]  [place], [place] [address]  Main: [phone]  Fax: [phone]   Gastroenterology Consultation  Referring Provider:     Steele Sizer, MD Primary Care Physician:  Steele Sizer, MD Reason for Consultation:    GERD        HPI:   Virtual Visit via Telephone Note  Patient attempted connecting with video visit but could not connect and therefore was changed to telephone visit  I connected with patient on 07/01/19 at 11:30 AM EST by telephone and verified that I am speaking with the correct person using two identifiers.   I discussed the limitations, risks, security and privacy concerns of performing an evaluation and management service by telephone and the availability of in person appointments. I also discussed with the patient that there may be a patient responsible charge related to this service. The patient expressed understanding and agreed to proceed.  Location of the patient: Home Location of provider: Home Participating persons: Patient and provider only   History of Present Illness: CC: Dysphagia odynophagia  Wendy Johnson is a 54 y.o. y/o female referred for consultation & management  by Dr. Steele Sizer, MD.  Patient reports 65-month history of odynophagia and dysphagia to solid foods.  No dysphagia to liquids.  No weight loss.  Also reports chronic acid reflux symptoms.  Was previously on Dexilant as Nexium was not working.  Recently Dexilant stop working and so she was switched over to Nexium which is still not working.  Reports having burning sensation in the chest all day.  Has had previous EGD and colonoscopies  Latest EGD was in April 2018 with Dr. Vicente Males which reported an irregular Z-line and biopsies reported Barrett's esophagus.  Colonoscopy at the same time, showed 2 polyps in the descending and transverse colon that showed tubular adenoma and sessile serrated polyp.  Other hyperplastic polyps removed  from sigmoid colon.  A polypoid lesion was reported at the dentate line and patient was referred to tertiary center, and this was removed at [facility] with EMR.  This showed hyperplastic polyp.  She also had esophageal leiomyoma excision in 2012 via right thoracoscopic enucleation  Patient also underwent ventral hernia repair with Dr. Dahlia Byes in 2020  Past Medical History:  Diagnosis Date  . ADHD (attention deficit hyperactivity disorder)   . Anxiety   . AR (allergic rhinitis)   . Arthritis   . Benign hypertension    NO MEDS  . Bipolar disorder ([place])   . Chronic back pain   . Chronic constipation   . Chronic insomnia   . COPD (chronic obstructive pulmonary disease) ([place])   . Deaf    RIGHT EAR  . Decreased dorsalis pedis pulse   . Depression   . Diabetes mellitus without complication ([place])    history of DM but no longer has  . Dyslipidemia   . Fatty liver   . GERD (gastroesophageal reflux disease)   . Hepatomegaly   . High cholesterol   . Hot flashes   . Migraine with aura   . OCD (obsessive compulsive disorder)   . Pain in joint, shoulder region 12/19/2018  . Plantar warts   . Sciatica, right side 02/18/2019  . Severe obesity ([place])   . Shortness of breath dyspnea   . Sleep apnea    NO CPAP  . Tobacco use   . Trochanteric bursitis of right hip     Past Surgical History:  Procedure Laterality Date  .  ABDOMINAL HYSTERECTOMY N/A 10/20/2014   Procedure: Total abdominial hysterectomy, bilateral salpingo-oophorectomy;  Surgeon: Brayton Mars, MD;  Location: [facility] ORS;  Service: Gynecology;  Laterality: N/A;  . BILATERAL SALPINGOOPHORECTOMY    . bone spurs removed Bilateral   . CARPAL TUNNEL RELEASE Left 06/08/2017   Procedure: CARPAL TUNNEL RELEASE;  Surgeon: Hessie Knows, MD;  Location: [facility] ORS;  Service: Orthopedics;  Laterality: Left;  . CARPAL TUNNEL RELEASE Right 10/10/2017   Procedure: CARPAL TUNNEL RELEASE;  Surgeon: Hessie Knows, MD;  Location: [facility] ORS;  Service:  Orthopedics;  Laterality: Right;  . COLONOSCOPY WITH PROPOFOL N/A 09/01/2016   Procedure: COLONOSCOPY WITH PROPOFOL;  Surgeon: Jonathon Bellows, MD;  Location: [facility] ENDOSCOPY;  Service: Endoscopy;  Laterality: N/A;  . DORSAL COMPARTMENT RELEASE Left 06/08/2017   Procedure: RELEASE DORSAL COMPARTMENT (DEQUERVAIN);  Surgeon: Hessie Knows, MD;  Location: [facility] ORS;  Service: Orthopedics;  Laterality: Left;  . ESOPHAGOGASTRODUODENOSCOPY (EGD) WITH PROPOFOL N/A 09/01/2016   Procedure: ESOPHAGOGASTRODUODENOSCOPY (EGD) WITH PROPOFOL;  Surgeon: Jonathon Bellows, MD;  Location: [facility] ENDOSCOPY;  Service: Endoscopy;  Laterality: N/A;  . FOOT SURGERY Bilateral   . INSERTION OF MESH N/A 12/26/2017   Procedure: INSERTION OF MESH;  Surgeon: Jules Husbands, MD;  Location: [facility] ORS;  Service: General;  Laterality: N/A;  . JOINT REPLACEMENT Right    Total Knee replacement X 2  . JOINT REPLACEMENT Bilateral    Total Hip Replacement  . knee arthroscopo Right   . LAPAROSCOPY  09/22/2014   Procedure: LAPAROSCOPY OPERATIVE;  Surgeon: Brayton Mars, MD;  Location: [facility] ORS;  Service: Gynecology;;  excision and fulgeration of endomertriosis  . LIPOMA EXCISION    . ROBOTIC ASSISTED LAPAROSCOPIC VENTRAL/INCISIONAL HERNIA REPAIR N/A 12/26/2017   Procedure: ROBOTIC ASSISTED LAPAROSCOPIC VENTRAL/INCISIONAL HERNIA REPAIR;  Surgeon: Jules Husbands, MD;  Location: [facility] ORS;  Service: General;  Laterality: N/A;  . TONSILLECTOMY    . TOTAL HIP ARTHROPLASTY Left 12/16/2014   Procedure: TOTAL HIP ARTHROPLASTY ANTERIOR APPROACH;  Surgeon: Hessie Knows, MD;  Location: [facility] ORS;  Service: Orthopedics;  Laterality: Left;  . TOTAL HIP ARTHROPLASTY Right 05/12/2015   Procedure: TOTAL HIP ARTHROPLASTY ANTERIOR APPROACH;  Surgeon: Hessie Knows, MD;  Location: [facility] OR[facility];  Service: Orthopedics;  Laterality: Right;  . TOTAL KNEE REVISION Right 09/22/2015   Procedure: TOTAL KNEE REVISION/ REVISE POLYIETHYLENE;  Surgeon: Hessie Knows, MD;  Location: [facility]  ORS;  Service: Orthopedics;  Laterality: Right;  . TOTAL KNEE REVISION Right 10/11/2016   Procedure: TOTAL KNEE REVISION;  Surgeon: Hessie Knows, MD;  Location: [facility] ORS;  Service: Orthopedics;  Laterality: Right;  . TUBAL LIGATION    . VENTRAL HERNIA REPAIR N/A 11/08/2018   Procedure: HERNIA REPAIR VENTRAL ADULT OPEN. DIABETIC, SLEEP APNEA;  Surgeon: Jules Husbands, MD;  Location: [facility] ORS;  Service: General;  Laterality: N/A;    Prior to Admission medications   Medication Sig Start Date End Date Taking? Authorizing Provider  albuterol (PROVENTIL) (2.5 MG/3ML) 0.083% nebulizer solution Take 3 mLs (2.5 mg total) by nebulization every 6 (six) hours as needed for wheezing or shortness of breath. 02/02/18  Yes Poulose, Bethel Born, NP  amphetamine-dextroamphetamine (ADDERALL) 15 MG tablet Take 15 mg by mouth daily.  07/02/18  Yes Myer Haff, MD  amphetamine-dextroamphetamine (ADDERALL) 30 MG tablet Take 30 mg by mouth 2 (two) times daily.   Yes Myer Haff, MD  atorvastatin (LIPITOR) 40 MG tablet Take 1 tablet (40 mg total) by mouth daily. 06/04/19  Yes Steele Sizer, MD  busPIRone (  BUSPAR) 30 MG tablet Take 30 mg by mouth 2 (two) times daily.  04/05/16  Yes Myer Haff, MD  clonazePAM (KLONOPIN) 1 MG tablet Take 1 mg by mouth 6 (six) times daily.    Yes Myer Haff, MD  cyclobenzaprine (FLEXERIL) 10 MG tablet Take 10 mg by mouth 3 (three) times daily as needed for muscle spasms.   Yes [provider]  esomeprazole (NEXIUM) 40 MG capsule Take 1 capsule (40 mg total) by mouth 2 (two) times daily before a meal. 06/18/19  Yes Sowles, Drue Stager, MD  hydrALAZINE (APRESOLINE) 10 MG tablet Take 1 tablet (10 mg total) by mouth 3 (three) times daily. Prn bp above 140/90 06/04/19  Yes Sowles, Drue Stager, MD  hydrOXYzine (ATARAX/VISTARIL) 25 MG tablet Take 25 mg by mouth every 6 (six) hours as needed for anxiety or itching (hives). 09/06/16  Yes Myer Haff, MD  ibuprofen (ADVIL) 800 MG tablet TAKE 1 TABLET BY MOUTH  EVERY 8 HOURS AS NEEDED. 06/26/19  Yes Lucio Edward, Leisa, PA-C  LATUDA 40 MG TABS tablet Take 40 mg by mouth daily with breakfast.  06/28/18  Yes [provider]  omega-3 acid ethyl esters (LOVAZA) 1 g capsule TAKE TWO CAPSULES BY MOUTH TWICE A DAY 04/25/19  Yes Sowles, Drue Stager, MD  Oxycodone HCl 10 MG TABS Take 1 tablet (10 mg total) by mouth 5 (five) times daily as needed. 06/18/19 07/18/19 Yes Molli Barrows, MD  PROAIR HFA 108 [phone] Base) MCG/ACT inhaler Inhale 2 puffs into the lungs every 4 (four) hours as needed for wheezing or shortness of breath. 10/04/18  Yes Sowles, Drue Stager, MD  sertraline (ZOLOFT) 100 MG tablet Take 100 mg by mouth 2 (two) times daily. 06/02/16  Yes Myer Haff, MD  SPIRIVA HANDIHALER 18 MCG inhalation capsule PLACE 1 CAPSULE INTO INHALER AND INHALE DAILY 04/25/19  Yes Sowles, Drue Stager, MD  traZODone (DESYREL) 100 MG tablet Take 400 mg by mouth at bedtime.  11/07/17  Yes Myer Haff, MD    Family History  Problem Relation Age of Onset  . Diabetes Mother   . Heart disease Father   . Heart attack Father   . Diabetes Sister   . Breast cancer Maternal Aunt        <50  . Breast cancer Paternal Aunt        x2.  <50  . Healthy Son   . Drug abuse Sister      Social History   Tobacco Use  . Smoking status: Current Every Day Smoker    Packs/day: 0.50    Years: 37.00    Pack years: 18.50    Types: Cigarettes    Start date: 11/26/1979  . Smokeless tobacco: Never Used  Substance Use Topics  . Alcohol use: No    Alcohol/week: 0.0 standard drinks    Comment: but used to be a heavy drinker, quit after DUI 2021  . Drug use: No    Comment: quit crack cocaine 2004    Allergies as of 07/01/2019 - Review Complete 07/01/2019  Allergen Reaction Noted  . Codeine Nausea Only 05/05/2015  . Imitrex [sumatriptan] Other (See Comments) 10/15/2014    Review of Systems:    All systems reviewed and negative except where noted in HPI.   Observations/Objective:  Labs: CBC      Component Value Date/Time   WBC 10.6 06/04/2019 1502   RBC 5.38 (H) 06/04/2019 1502   HGB 15.3 06/04/2019 1502   HGB 12.5 02/03/2016 1116   HCT 46.9 (H)  06/04/2019 1502   HCT 38.7 02/03/2016 1116   PLT 291 06/04/2019 1502   PLT 261 02/03/2016 1116   MCV 87.2 06/04/2019 1502   MCV 80 02/03/2016 1116   MCV 87 04/29/2014 0943   MCH 28.4 06/04/2019 1502   MCHC 32.6 06/04/2019 1502   RDW 13.9 06/04/2019 1502   RDW 16.3 (H) 02/03/2016 1116   RDW 13.6 04/29/2014 0943   LYMPHSABS 1,919 06/04/2019 1502   LYMPHSABS 1.9 02/03/2016 1116   MONOABS 0.6 10/16/2018 0719   EOSABS 32 06/04/2019 1502   EOSABS 0.3 02/03/2016 1116   BASOSABS 42 06/04/2019 1502   BASOSABS 0.0 02/03/2016 1116   CMP     Component Value Date/Time   NA 139 06/04/2019 1502   NA 140 02/03/2016 1116   NA 138 05/08/2014 0555   K 4.4 06/04/2019 1502   K 3.8 05/08/2014 0555   CL 104 06/04/2019 1502   CL 101 05/08/2014 0555   CO2 26 06/04/2019 1502   CO2 32 05/08/2014 0555   GLUCOSE 92 06/04/2019 1502   GLUCOSE 112 (H) 05/08/2014 0555   BUN 8 06/04/2019 1502   BUN 12 02/03/2016 1116   BUN 6 (L) 05/08/2014 0555   CREATININE 0.65 06/04/2019 1502   CALCIUM 9.6 06/04/2019 1502   CALCIUM 8.2 (L) 05/08/2014 0555   PROT 6.8 06/04/2019 1502   PROT 7.1 02/03/2016 1116   PROT 7.9 04/17/2013 0106   ALBUMIN 3.7 10/16/2018 0719   ALBUMIN 4.3 02/03/2016 1116   ALBUMIN 3.7 04/17/2013 0106   AST 10 06/04/2019 1502   AST 46 (H) 04/17/2013 0106   ALT 11 06/04/2019 1502   ALT 49 04/17/2013 0106   ALKPHOS 86 10/16/2018 0719   ALKPHOS 85 04/17/2013 0106   BILITOT 0.2 06/04/2019 1502   BILITOT <0.2 02/03/2016 1116   BILITOT 0.3 04/17/2013 0106   GFRNONAA 103 06/04/2019 1502   GFRAA 119 06/04/2019 1502    Imaging Studies: DG PAIN CLINIC C-ARM 1-60 MIN NO REPORT  Result Date: 06/18/2019 Fluoro was used, but no Radiologist interpretation will be provided. Please refer to "NOTES" tab for provider progress  note.   Assessment and Plan:   Wendy Johnson is a 54 y.o. y/o female has been referred for uncontrolled GERD, with history of chronic pain, reporting odynophagia and dysphagia for the last 6 months, with history of right thoracoscopic esophageal leiomyoma enucleation in 2012  Assessment and Plan: EGD indicated for evaluation of new dysphagia and odynophagia  Rule out Candida, and rule out any underlying lesions, especially given history of esophageal leiomyoma excision  Since previous Z- line biopsies by Dr. Vicente Males in 2018, reported Barrett's, evaluate Z-line for any evidence of Barrett's and biopsy if salmon colored mucosa present during this EGD  Uncontrolled reflux also likely contributing to her symptoms  Patient is now on Nexium and Nexium has not helped before.  Was on [place] which was working but stopped working recently as well  Has never been on omeprazole, will switch to omeprazole.  (Risks of PPI use were discussed with patient including bone loss, C. Diff diarrhea, pneumonia, infections, CKD, electrolyte abnormalities.  If clinically possible based on symptoms, goal would be to maintain patient on the lowest dose possible, or discontinue the medication with institution of acid reflux lifestyle modifications over time. Pt. Verbalizes understanding and chooses to continue the medication.)  Patient also interested in scheduling her polyp surveillance colonoscopy along with her EGD  I have discussed alternative options, risks & benefits,  which include, but are not limited to, bleeding, infection, perforation,respiratory complication & drug reaction.  The patient agrees with this plan & written consent will be obtained.    Patient educated extensively on acid reflux lifestyle modification, including buying a bed wedge, not eating 3 hrs before bedtime, diet modifications, and handout given for the same.     Follow Up Instructions: 6-8 weeks  I discussed the assessment and  treatment plan with the patient. The patient was provided an opportunity to ask questions and all were answered. The patient agreed with the plan and demonstrated an understanding of the instructions.   The patient was advised to call back or seek an in-person evaluation if the symptoms worsen or if the condition fails to improve as anticipated.  I provided 12 minutes of non-face-to-face time during this encounter.   Virgel Manifold, MD  Speech recognition software was used to dictate the above note.

## 2019-07-01 NOTE — Addendum Note (Signed)
Addended by: Ulyess Blossom L on: 07/01/2019 01:06 PM   Modules accepted: Orders

## 2019-07-01 NOTE — Progress Notes (Signed)
Wendy Johnson [address]  [place]  [place], [place] [address]  Main: [phone]  Fax: [phone]   Gastroenterology Consultation  Referring Provider:     Steele Sizer, MD Primary Care Physician:  Steele Sizer, MD Reason for Consultation:    GERD        HPI:   Virtual Visit via Telephone Note  Patient attempted connecting with video visit but could not connect and therefore was changed to telephone visit  I connected with patient on 07/01/19 at 11:30 AM EST by telephone and verified that I am speaking with the correct person using two identifiers.   I discussed the limitations, risks, security and privacy concerns of performing an evaluation and management service by telephone and the availability of in person appointments. I also discussed with the patient that there may be a patient responsible charge related to this service. The patient expressed understanding and agreed to proceed.  Location of the patient: Home Location of provider: Home Participating persons: Patient and provider only   History of Present Illness: CC: Dysphagia odynophagia  Wendy Johnson is a 54 y.o. y/o female referred for consultation & management  by Dr. Steele Sizer, MD.  Patient reports 76-month history of odynophagia and dysphagia to solid foods.  No dysphagia to liquids.  No weight loss.  Also reports chronic acid reflux symptoms.  Was previously on Dexilant as Nexium was not working.  Recently Dexilant stop working and so she was switched over to Nexium which is still not working.  Reports having burning sensation in the chest all day.  Has had previous EGD and colonoscopies  Latest EGD was in April 2018 with Dr. Vicente Males which reported an irregular Z-line and biopsies reported Barrett's esophagus.  Colonoscopy at the same time, showed 2 polyps in the descending and transverse colon that showed tubular adenoma and sessile serrated polyp.  Other hyperplastic polyps removed  from sigmoid colon.  A polypoid lesion was reported at the dentate line and patient was referred to tertiary center, and this was removed at [facility] with EMR.  This showed hyperplastic polyp.  She also had esophageal leiomyoma excision in 2012 via right thoracoscopic enucleation  Patient also underwent ventral hernia repair with Dr. Dahlia Byes in 2020  Past Medical History:  Diagnosis Date  . ADHD (attention deficit hyperactivity disorder)   . Anxiety   . AR (allergic rhinitis)   . Arthritis   . Benign hypertension    NO MEDS  . Bipolar disorder ([place])   . Chronic back pain   . Chronic constipation   . Chronic insomnia   . COPD (chronic obstructive pulmonary disease) ([place])   . Deaf    RIGHT EAR  . Decreased dorsalis pedis pulse   . Depression   . Diabetes mellitus without complication ([place])    history of DM but no longer has  . Dyslipidemia   . Fatty liver   . GERD (gastroesophageal reflux disease)   . Hepatomegaly   . High cholesterol   . Hot flashes   . Migraine with aura   . OCD (obsessive compulsive disorder)   . Pain in joint, shoulder region 12/19/2018  . Plantar warts   . Sciatica, right side 02/18/2019  . Severe obesity ([place])   . Shortness of breath dyspnea   . Sleep apnea    NO CPAP  . Tobacco use   . Trochanteric bursitis of right hip     Past Surgical History:  Procedure Laterality Date  .  ABDOMINAL HYSTERECTOMY N/A 10/20/2014   Procedure: Total abdominial hysterectomy, bilateral salpingo-oophorectomy;  Surgeon: Brayton Mars, MD;  Location: [facility] ORS;  Service: Gynecology;  Laterality: N/A;  . BILATERAL SALPINGOOPHORECTOMY    . bone spurs removed Bilateral   . CARPAL TUNNEL RELEASE Left 06/08/2017   Procedure: CARPAL TUNNEL RELEASE;  Surgeon: Hessie Knows, MD;  Location: [facility] ORS;  Service: Orthopedics;  Laterality: Left;  . CARPAL TUNNEL RELEASE Right 10/10/2017   Procedure: CARPAL TUNNEL RELEASE;  Surgeon: Hessie Knows, MD;  Location: [facility] ORS;  Service:  Orthopedics;  Laterality: Right;  . COLONOSCOPY WITH PROPOFOL N/A 09/01/2016   Procedure: COLONOSCOPY WITH PROPOFOL;  Surgeon: Jonathon Bellows, MD;  Location[facility] ENDOSCOPY;  Service: Endoscopy;  Laterality: N/A;  . DORSAL COMPARTMENT RELEASE Left 06/08/2017   Procedure: RELEASE DORSAL COMPARTMENT (DEQUERVAIN);  Surgeon: Hessie Knows, MD;  Location: [facility] ORS;  Service: Orthopedics;  Laterality: Left;  . ESOPHAGOGASTRODUODENOSCOPY (EGD) WITH PROPOFOL N/A 09/01/2016   Procedure: ESOPHAGOGASTRODUODENOSCOPY (EGD) WITH PROPOFOL;  Surgeon: Jonathon Bellows, MD;  Location: [facility] ENDOSCOPY;  Service: Endoscopy;  Laterality: N/A;  . FOOT SURGERY Bilateral   . INSERTION OF MESH N/A 12/26/2017   Procedure: INSERTION OF MESH;  Surgeon: Jules Husbands, MD;  Location: [facility] ORS;  Service: General;  Laterality: N/A;  . JOINT REPLACEMENT Right    Total Knee replacement X 2  . JOINT REPLACEMENT Bilateral    Total Hip Replacement  . knee arthroscopo Right   . LAPAROSCOPY  09/22/2014   Procedure: LAPAROSCOPY OPERATIVE;  Surgeon: Brayton Mars, MD;  Location: [facility] ORS;  Service: Gynecology;;  excision and fulgeration of endomertriosis  . LIPOMA EXCISION    . ROBOTIC ASSISTED LAPAROSCOPIC VENTRAL/INCISIONAL HERNIA REPAIR N/A 12/26/2017   Procedure: ROBOTIC ASSISTED LAPAROSCOPIC VENTRAL/INCISIONAL HERNIA REPAIR;  Surgeon: Jules Husbands, MD;  Location: [facility] ORS;  Service: General;  Laterality: N/A;  . TONSILLECTOMY    . TOTAL HIP ARTHROPLASTY Left 12/16/2014   Procedure: TOTAL HIP ARTHROPLASTY ANTERIOR APPROACH;  Surgeon: Hessie Knows, MD;  Location: [facility] ORS;  Service: Orthopedics;  Laterality: Left;  . TOTAL HIP ARTHROPLASTY Right 05/12/2015   Procedure: TOTAL HIP ARTHROPLASTY ANTERIOR APPROACH;  Surgeon: Hessie Knows, MD;  Location: [facility] ORS;  Service: Orthopedics;  Laterality: Right;  . TOTAL KNEE REVISION Right 09/22/2015   Procedure: TOTAL KNEE REVISION/ REVISE POLYIETHYLENE;  Surgeon: Hessie Knows, MD;  Location: [facility]  ORS;  Service: Orthopedics;  Laterality: Right;  . TOTAL KNEE REVISION Right 10/11/2016   Procedure: TOTAL KNEE REVISION;  Surgeon: Hessie Knows, MD;  Location: [facility] ORS;  Service: Orthopedics;  Laterality: Right;  . TUBAL LIGATION    . VENTRAL HERNIA REPAIR N/A 11/08/2018   Procedure: HERNIA REPAIR VENTRAL ADULT OPEN. DIABETIC, SLEEP APNEA;  Surgeon: Jules Husbands, MD;  Location: [facility] ORS;  Service: General;  Laterality: N/A;    Prior to Admission medications   Medication Sig Start Date End Date Taking? Authorizing Provider  albuterol (PROVENTIL) (2.5 MG/3ML) 0.083% nebulizer solution Take 3 mLs (2.5 mg total) by nebulization every 6 (six) hours as needed for wheezing or shortness of breath. 02/02/18  Yes Poulose, Bethel Born, NP  amphetamine-dextroamphetamine (ADDERALL) 15 MG tablet Take 15 mg by mouth daily.  07/02/18  Yes Myer Haff, MD  amphetamine-dextroamphetamine (ADDERALL) 30 MG tablet Take 30 mg by mouth 2 (two) times daily.   Yes Myer Haff, MD  atorvastatin (LIPITOR) 40 MG tablet Take 1 tablet (40 mg total) by mouth daily. 06/04/19  Yes Steele Sizer, MD  busPIRone (  BUSPAR) 30 MG tablet Take 30 mg by mouth 2 (two) times daily.  04/05/16  Yes Myer Haff, MD  clonazePAM (KLONOPIN) 1 MG tablet Take 1 mg by mouth 6 (six) times daily.    Yes Myer Haff, MD  cyclobenzaprine (FLEXERIL) 10 MG tablet Take 10 mg by mouth 3 (three) times daily as needed for muscle spasms.   Yes [provider]  esomeprazole (NEXIUM) 40 MG capsule Take 1 capsule (40 mg total) by mouth 2 (two) times daily before a meal. 06/18/19  Yes Sowles, Drue Stager, MD  hydrALAZINE (APRESOLINE) 10 MG tablet Take 1 tablet (10 mg total) by mouth 3 (three) times daily. Prn bp above 140/90 06/04/19  Yes Sowles, Drue Stager, MD  hydrOXYzine (ATARAX/VISTARIL) 25 MG tablet Take 25 mg by mouth every 6 (six) hours as needed for anxiety or itching (hives). 09/06/16  Yes Myer Haff, MD  ibuprofen (ADVIL) 800 MG tablet TAKE 1 TABLET BY MOUTH  EVERY 8 HOURS AS NEEDED. 06/26/19  Yes Lucio Edward, Leisa, PA-C  LATUDA 40 MG TABS tablet Take 40 mg by mouth daily with breakfast.  06/28/18  Yes [provider]  omega-3 acid ethyl esters (LOVAZA) 1 g capsule TAKE TWO CAPSULES BY MOUTH TWICE A DAY 04/25/19  Yes Sowles, Drue Stager, MD  Oxycodone HCl 10 MG TABS Take 1 tablet (10 mg total) by mouth 5 (five) times daily as needed. 06/18/19 07/18/19 Yes Molli Barrows, MD  PROAIR HFA 108 [phone] Base) MCG/ACT inhaler Inhale 2 puffs into the lungs every 4 (four) hours as needed for wheezing or shortness of breath. 10/04/18  Yes Sowles, Drue Stager, MD  sertraline (ZOLOFT) 100 MG tablet Take 100 mg by mouth 2 (two) times daily. 06/02/16  Yes Myer Haff, MD  SPIRIVA HANDIHALER 18 MCG inhalation capsule PLACE 1 CAPSULE INTO INHALER AND INHALE DAILY 04/25/19  Yes Sowles, Drue Stager, MD  traZODone (DESYREL) 100 MG tablet Take 400 mg by mouth at bedtime.  11/07/17  Yes Myer Haff, MD    Family History  Problem Relation Age of Onset  . Diabetes Mother   . Heart disease Father   . Heart attack Father   . Diabetes Sister   . Breast cancer Maternal Aunt        <50  . Breast cancer Paternal Aunt        x2.  <50  . Healthy Son   . Drug abuse Sister      Social History   Tobacco Use  . Smoking status: Current Every Day Smoker    Packs/day: 0.50    Years: 37.00    Pack years: 18.50    Types: Cigarettes    Start date: 11/26/1979  . Smokeless tobacco: Never Used  Substance Use Topics  . Alcohol use: No    Alcohol/week: 0.0 standard drinks    Comment: but used to be a heavy drinker, quit after DUI 2021  . Drug use: No    Comment: quit crack cocaine 2004    Allergies as of 07/01/2019 - Review Complete 07/01/2019  Allergen Reaction Noted  . Codeine Nausea Only 05/05/2015  . Imitrex [sumatriptan] Other (See Comments) 10/15/2014    Review of Systems:    All systems reviewed and negative except where noted in HPI.   Observations/Objective:  Labs: CBC      Component Value Date/Time   WBC 10.6 06/04/2019 1502   RBC 5.38 (H) 06/04/2019 1502   HGB 15.3 06/04/2019 1502   HGB 12.5 02/03/2016 1116   HCT 46.9 (H)  06/04/2019 1502   HCT 38.7 02/03/2016 1116   PLT 291 06/04/2019 1502   PLT 261 02/03/2016 1116   MCV 87.2 06/04/2019 1502   MCV 80 02/03/2016 1116   MCV 87 04/29/2014 0943   MCH 28.4 06/04/2019 1502   MCHC 32.6 06/04/2019 1502   RDW 13.9 06/04/2019 1502   RDW 16.3 (H) 02/03/2016 1116   RDW 13.6 04/29/2014 0943   LYMPHSABS 1,919 06/04/2019 1502   LYMPHSABS 1.9 02/03/2016 1116   MONOABS 0.6 10/16/2018 0719   EOSABS 32 06/04/2019 1502   EOSABS 0.3 02/03/2016 1116   BASOSABS 42 06/04/2019 1502   BASOSABS 0.0 02/03/2016 1116   CMP     Component Value Date/Time   NA 139 06/04/2019 1502   NA 140 02/03/2016 1116   NA 138 05/08/2014 0555   K 4.4 06/04/2019 1502   K 3.8 05/08/2014 0555   CL 104 06/04/2019 1502   CL 101 05/08/2014 0555   CO2 26 06/04/2019 1502   CO2 32 05/08/2014 0555   GLUCOSE 92 06/04/2019 1502   GLUCOSE 112 (H) 05/08/2014 0555   BUN 8 06/04/2019 1502   BUN 12 02/03/2016 1116   BUN 6 (L) 05/08/2014 0555   CREATININE 0.65 06/04/2019 1502   CALCIUM 9.6 06/04/2019 1502   CALCIUM 8.2 (L) 05/08/2014 0555   PROT 6.8 06/04/2019 1502   PROT 7.1 02/03/2016 1116   PROT 7.9 04/17/2013 0106   ALBUMIN 3.7 10/16/2018 0719   ALBUMIN 4.3 02/03/2016 1116   ALBUMIN 3.7 04/17/2013 0106   AST 10 06/04/2019 1502   AST 46 (H) 04/17/2013 0106   ALT 11 06/04/2019 1502   ALT 49 04/17/2013 0106   ALKPHOS 86 10/16/2018 0719   ALKPHOS 85 04/17/2013 0106   BILITOT 0.2 06/04/2019 1502   BILITOT <0.2 02/03/2016 1116   BILITOT 0.3 04/17/2013 0106   GFRNONAA 103 06/04/2019 1502   GFRAA 119 06/04/2019 1502    Imaging Studies: DG PAIN CLINIC C-ARM 1-60 MIN NO REPORT  Result Date: 06/18/2019 Fluoro was used, but no Radiologist interpretation will be provided. Please refer to "NOTES" tab for provider progress  note.   Assessment and Plan:   Wendy Johnson is a 54 y.o. y/o female has been referred for uncontrolled GERD, with history of chronic pain, reporting odynophagia and dysphagia for the last 6 months, with history of right thoracoscopic esophageal leiomyoma enucleation in 2012  Assessment and Plan: EGD indicated for evaluation of new dysphagia and odynophagia  Rule out Candida, and rule out any underlying lesions, especially given history of esophageal leiomyoma excision  Since previous Z- line biopsies by Dr. Vicente Males in 2018, reported Barrett's, evaluate Z-line for any evidence of Barrett's and biopsy if salmon colored mucosa present during this EGD  Uncontrolled reflux also likely contributing to her symptoms  Patient is now on Nexium and Nexium has not helped before.  Was on [place] which was working but stopped working recently as well  Has never been on omeprazole, will switch to omeprazole.  (Risks of PPI use were discussed with patient including bone loss, C. Diff diarrhea, pneumonia, infections, CKD, electrolyte abnormalities.  If clinically possible based on symptoms, goal would be to maintain patient on the lowest dose possible, or discontinue the medication with institution of acid reflux lifestyle modifications over time. Pt. Verbalizes understanding and chooses to continue the medication.)  Patient also interested in scheduling her polyp surveillance colonoscopy along with her EGD  I have discussed alternative options, risks & benefits,  which include, but are not limited to, bleeding, infection, perforation,respiratory complication & drug reaction.  The patient agrees with this plan & written consent will be obtained.    Patient educated extensively on acid reflux lifestyle modification, including buying a bed wedge, not eating 3 hrs before bedtime, diet modifications, and handout given for the same.     Follow Up Instructions: 6-8 weeks  I discussed the assessment and  treatment plan with the patient. The patient was provided an opportunity to ask questions and all were answered. The patient agreed with the plan and demonstrated an understanding of the instructions.   The patient was advised to call back or seek an in-person evaluation if the symptoms worsen or if the condition fails to improve as anticipated.  I provided 12 minutes of non-face-to-face time during this encounter.   Virgel Manifold, MD  Speech recognition software was used to dictate the above note.

## 2019-07-08 ENCOUNTER — Telehealth: Payer: Self-pay

## 2019-07-08 NOTE — Telephone Encounter (Signed)
Patient state she has to go to court on 07/23/2019 and needs to move her colonoscopy. Moved patient to 07/30/2019. Called ENDO and got patient moved with Wannetta Sender

## 2019-07-12 ENCOUNTER — Telehealth: Payer: Self-pay | Admitting: Family Medicine

## 2019-07-12 DIAGNOSIS — J449 Chronic obstructive pulmonary disease, unspecified: Secondary | ICD-10-CM | POA: Diagnosis not present

## 2019-07-12 NOTE — Telephone Encounter (Signed)
Pt calling - wants to know if Dr. Ancil Boozer will put her on Gabapentin.  Also wants to know if Dr. Ancil Boozer would recommend that she get the COVID vaccine.

## 2019-07-15 NOTE — Telephone Encounter (Signed)
Appt made for 08-17-19 with Leisa .Sowles no avail.

## 2019-07-16 ENCOUNTER — Ambulatory Visit: Payer: Medicare Other

## 2019-07-17 ENCOUNTER — Ambulatory Visit (INDEPENDENT_AMBULATORY_CARE_PROVIDER_SITE_OTHER): Payer: Medicare Other | Admitting: Family Medicine

## 2019-07-17 ENCOUNTER — Encounter: Payer: Self-pay | Admitting: Family Medicine

## 2019-07-17 DIAGNOSIS — M79604 Pain in right leg: Secondary | ICD-10-CM

## 2019-07-17 DIAGNOSIS — G8929 Other chronic pain: Secondary | ICD-10-CM

## 2019-07-17 NOTE — Progress Notes (Signed)
Name: Wendy Johnson   MRN: [id number]    DOB: July 14, 1965   Date:07/17/2019       Progress Note  Subjective  Chief Complaint  Chief Complaint  Patient presents with  . Medication Refill    Patient needs to have Gabapentin. She was prescribed this medication through her mental health PCP. He was fired and she is unable to get this medication and she really needs it.  . Chest Pain    she is going to have EGB     I connected with  Onyx Steeley Chiasson on 07/17/19 at  2:40 PM EST by telephone and verified that I am speaking with the correct person using two identifiers.  I discussed the limitations, risks, security and privacy concerns of performing an evaluation and management service by telephone and the availability of in person appointments. Staff also discussed with the patient that there may be a patient responsible charge related to this service. Patient Location: at home  Provider Location: [facility]   HPI  Right leg pain/chronic: patient called asking for gabapentin, she states she used to get 240 capsules per month. It used to be given by Dr. Leonides Schanz ( psychiatrist) about 6 years ago. She wants to get it filled by me now for chronic right leg pain with tingling. She was recently seen by Ortho but was only given 30 capsules and she needs more. Explained it is a controlled substance and I will need to contact Ortho and also her current psychiatrist  ( Dr. Kasandra Knudsen ) but even if approved by them I don't feel comfortable with the amount of capsules. She understood and asked Vonna Kotyk my CMA to contact Dr. Kasandra Knudsen and also Arvella Nigh PA   She states her pain is constant, dull but sometimes sharp on right leg from multiple surgeries. No tingling or numbness. She had difficulty ambulating at times    Patient Active Problem List   Diagnosis Date Noted  . Sciatica, right side 02/18/2019  . Pain in joint, shoulder region 12/19/2018  . Unspecified inflammatory spondylopathy, lumbar  region ([place]) 10/04/2018  . BMI 40.0-44.9, adult ([place]) 08/17/2018  . Lumbar spondylosis 03/19/2018  . Polyarthralgia 03/19/2018  . History of alcoholism ([place]) 07/19/2017  . Bilateral carpal tunnel syndrome 05/29/2017  . Painful total knee replacement, right ([place]) 10/11/2016  . Rotator cuff tendinitis, right 07/29/2016  . Occipital neuralgia 10/12/2015  . Instability of prosthetic knee ([place]) 09/22/2015  . Primary osteoarthritis of right hip 05/12/2015  . Sleep apnea 04/07/2015  . Primary osteoarthritis of left hip 12/16/2014  . Metabolic syndrome [id number]  . Migraine without aura and without status migrainosus, not intractable 11/26/2014  . GERD without esophagitis 11/26/2014  . COPD, moderate ([place]) 11/26/2014  . Nocturnal oxygen desaturation 11/26/2014  . Supplemental oxygen dependent 11/26/2014  . Hearing loss 11/26/2014  . Pain syndrome, chronic 11/26/2014  . History of hypertension 11/26/2014  . Dyslipidemia 11/26/2014  . Morbid obesity ([place]) 11/26/2014  . Dyslipidemia associated with type 2 diabetes mellitus ([place]) 11/26/2014  . Chronic constipation 11/26/2014  . Generalized anxiety disorder 11/11/2014  . DDD (degenerative disc disease), lumbar 11/11/2014  . Facet arthritis of lumbar region 11/11/2014  . Primary osteoarthritis involving multiple joints 11/11/2014  . H/O hysterectomy for benign disease 10/20/2014  . Low back derangement syndrome 09/30/2014  . Bipolar disorder ([place]) 09/30/2014    Past Surgical History:  Procedure Laterality Date  . ABDOMINAL HYSTERECTOMY N/A 10/20/2014   Procedure: Total abdominial hysterectomy, bilateral salpingo-oophorectomy;  Surgeon: Brayton Mars, MD;  Location: [facility] ORS;  Service: Gynecology;  Laterality: N/A;  . BILATERAL SALPINGOOPHORECTOMY    . bone spurs removed Bilateral   . CARPAL TUNNEL RELEASE Left 06/08/2017   Procedure: CARPAL TUNNEL RELEASE;  Surgeon: Hessie Knows, MD;  Location: [facility] ORS;  Service: Orthopedics;   Laterality: Left;  . CARPAL TUNNEL RELEASE Right 10/10/2017   Procedure: CARPAL TUNNEL RELEASE;  Surgeon: Hessie Knows, MD;  Location: [facility] ORS;  Service: Orthopedics;  Laterality: Right;  . COLONOSCOPY WITH PROPOFOL N/A 09/01/2016   Procedure: COLONOSCOPY WITH PROPOFOL;  Surgeon: Jonathon Bellows, MD;  Location: [facility] ENDOSCOPY;  Service: Endoscopy;  Laterality: N/A;  . DORSAL COMPARTMENT RELEASE Left 06/08/2017   Procedure: RELEASE DORSAL COMPARTMENT (DEQUERVAIN);  Surgeon: Hessie Knows, MD;  Location: [facility] ORS;  Service: Orthopedics;  Laterality: Left;  . ESOPHAGOGASTRODUODENOSCOPY (EGD) WITH PROPOFOL N/A 09/01/2016   Procedure: ESOPHAGOGASTRODUODENOSCOPY (EGD) WITH PROPOFOL;  Surgeon: Jonathon Bellows, MD;  Location: [facility] ENDOSCOPY;  Service: Endoscopy;  Laterality: N/A;  . FOOT SURGERY Bilateral   . INSERTION OF MESH N/A 12/26/2017   Procedure: INSERTION OF MESH;  Surgeon: Jules Husbands, MD;  Location: [facility] ORS;  Service: General;  Laterality: N/A;  . JOINT REPLACEMENT Right    Total Knee replacement X 2  . JOINT REPLACEMENT Bilateral    Total Hip Replacement  . knee arthroscopo Right   . LAPAROSCOPY  09/22/2014   Procedure: LAPAROSCOPY OPERATIVE;  Surgeon: Brayton Mars, MD;  Location: [facility] ORS;  Service: Gynecology;;  excision and fulgeration of endomertriosis  . LIPOMA EXCISION    . ROBOTIC ASSISTED LAPAROSCOPIC VENTRAL/INCISIONAL HERNIA REPAIR N/A 12/26/2017   Procedure: ROBOTIC ASSISTED LAPAROSCOPIC VENTRAL/INCISIONAL HERNIA REPAIR;  Surgeon: Jules Husbands, MD;  Location: [facility] ORS;  Service: General;  Laterality: N/A;  . TONSILLECTOMY    . TOTAL HIP ARTHROPLASTY Left 12/16/2014   Procedure: TOTAL HIP ARTHROPLASTY ANTERIOR APPROACH;  Surgeon: Hessie Knows, MD;  Location: [facility] ORS;  Service: Orthopedics;  Laterality: Left;  . TOTAL HIP ARTHROPLASTY Right 05/12/2015   Procedure: TOTAL HIP ARTHROPLASTY ANTERIOR APPROACH;  Surgeon: Hessie Knows, MD;  Location: [facility] ORS;  Service: Orthopedics;   Laterality: Right;  . TOTAL KNEE REVISION Right 09/22/2015   Procedure: TOTAL KNEE REVISION/ REVISE POLYIETHYLENE;  Surgeon: Hessie Knows, MD;  Location: [facility] ORS;  Service: Orthopedics;  Laterality: Right;  . TOTAL KNEE REVISION Right 10/11/2016   Procedure: TOTAL KNEE REVISION;  Surgeon: Hessie Knows, MD;  Location: [facility] ORS;  Service: Orthopedics;  Laterality: Right;  . TUBAL LIGATION    . VENTRAL HERNIA REPAIR N/A 11/08/2018   Procedure: HERNIA REPAIR VENTRAL ADULT OPEN. DIABETIC, SLEEP APNEA;  Surgeon: Jules Husbands, MD;  Location: [facility] ORS;  Service: General;  Laterality: N/A;    Family History  Problem Relation Age of Onset  . Diabetes Mother   . Heart disease Father   . Heart attack Father   . Diabetes Sister   . Breast cancer Maternal Aunt        <50  . Breast cancer Paternal Aunt        x2.  <50  . Healthy Son   . Drug abuse Sister     Social History   Tobacco Use  . Smoking status: Current Every Day Smoker    Packs/day: 0.50    Years: 37.00    Pack years: 18.50    Types: Cigarettes    Start date: 11/26/1979  . Smokeless tobacco: Never Used  Substance Use Topics  .  Alcohol use: No    Alcohol/Johnson: 0.0 standard drinks    Comment: but used to be a heavy drinker, quit after DUI 2021    Current Outpatient Medications:  .  gabapentin (NEURONTIN) 300 MG capsule, Take by mouth., Disp: , Rfl:  .  albuterol (PROVENTIL) (2.5 MG/3ML) 0.083% nebulizer solution, Take 3 mLs (2.5 mg total) by nebulization every 6 (six) hours as needed for wheezing or shortness of breath., Disp: 75 mL, Rfl: 1 .  amphetamine-dextroamphetamine (ADDERALL) 15 MG tablet, Take 15 mg by mouth daily. , Disp: , Rfl:  .  amphetamine-dextroamphetamine (ADDERALL) 30 MG tablet, Take 30 mg by mouth 2 (two) times daily., Disp: , Rfl:  .  atorvastatin (LIPITOR) 40 MG tablet, Take 1 tablet (40 mg total) by mouth daily., Disp: 90 tablet, Rfl: 1 .  busPIRone (BUSPAR) 30 MG tablet, Take 30 mg by mouth 2 (two) times  daily. , Disp: , Rfl:  .  clonazePAM (KLONOPIN) 1 MG tablet, Take 1 mg by mouth 6 (six) times daily. , Disp: , Rfl:  .  cyclobenzaprine (FLEXERIL) 10 MG tablet, Take 10 mg by mouth 3 (three) times daily as needed for muscle spasms., Disp: , Rfl:  .  hydrALAZINE (APRESOLINE) 10 MG tablet, Take 1 tablet (10 mg total) by mouth 3 (three) times daily. Prn bp above 140/90, Disp: 90 tablet, Rfl: 0 .  hydrOXYzine (ATARAX/VISTARIL) 25 MG tablet, Take 25 mg by mouth every 6 (six) hours as needed for anxiety or itching (hives)., Disp: , Rfl:  .  ibuprofen (ADVIL) 800 MG tablet, TAKE 1 TABLET BY MOUTH EVERY 8 HOURS AS NEEDED., Disp: 90 tablet, Rfl: 0 .  LATUDA 40 MG TABS tablet, Take 40 mg by mouth daily with breakfast. , Disp: , Rfl:  .  omega-3 acid ethyl esters (LOVAZA) 1 g capsule, TAKE TWO CAPSULES BY MOUTH TWICE A DAY, Disp: 360 capsule, Rfl: 0 .  omeprazole (PRILOSEC) 40 MG capsule, Take 1 capsule (40 mg total) by mouth in the morning and at bedtime., Disp: 30 capsule, Rfl: 1 .  Oxycodone HCl 10 MG TABS, Take 1 tablet (10 mg total) by mouth 5 (five) times daily as needed., Disp: 130 tablet, Rfl: 0 .  PROAIR HFA 108 (90 Base) MCG/ACT inhaler, Inhale 2 puffs into the lungs every 4 (four) hours as needed for wheezing or shortness of breath., Disp: 8.5 g, Rfl: 0 .  sertraline (ZOLOFT) 100 MG tablet, Take 100 mg by mouth 2 (two) times daily., Disp: , Rfl:  .  SPIRIVA HANDIHALER 18 MCG inhalation capsule, PLACE 1 CAPSULE INTO INHALER AND INHALE DAILY, Disp: 30 capsule, Rfl: 5 .  traZODone (DESYREL) 100 MG tablet, Take 400 mg by mouth at bedtime. , Disp: , Rfl:   Allergies  Allergen Reactions  . Codeine Nausea Only  . Imitrex [Sumatriptan] Other (See Comments)    Headaches    I personally reviewed active problem list, medication list, allergies, family history, social history, health maintenance with the patient/caregiver today.   ROS  Ten systems reviewed and is negative except as mentioned in HPI    Objective  Virtual encounter, vitals not obtained.  There is no height or weight on file to calculate BMI.  Physical Exam  Awake, alert and oriented   PHQ2/9: Depression screen [facility] 2/9 07/17/2019 06/18/2019 06/18/2019 06/04/2019 02/18/2019  Decreased Interest 1 0 0 0 0  Down, Depressed, Hopeless 1 1 0 0 0  PHQ - 2 Score 2 1 0 0 0  Altered  sleeping 0 0 - 0 -  Tired, decreased energy 1 1 - 0 -  Change in appetite 0 0 - 0 -  Feeling bad or failure about yourself  0 0 - 0 -  Trouble concentrating 2 0 - 0 -  Moving slowly or fidgety/restless 0 0 - 0 -  Suicidal thoughts 0 0 - 0 -  PHQ-9 Score 5 2 - 0 -  Difficult doing work/chores Somewhat difficult Not difficult at all - - -  Some recent data might be hidden   PHQ-2/9 Result is positive.    Fall Risk: Fall Risk  07/17/2019 06/18/2019 06/18/2019 06/04/2019 02/18/2019  Falls in the past year? 0 0 0 0 0  Number falls in past yr: 0 0 - 0 -  Injury with Fall? 0 0 - 0 -  Comment - - - - -  Risk for fall due to : - - - - -  Follow up - - - - -  Comment - - - - -     Assessment & Plan  1. Chronic pain of right lower extremity  Holding off on sending medication until discuss it with Dr. Kasandra Knudsen and Ortho PA Arvella Nigh  I discussed the assessment and treatment plan with the patient. The patient was provided an opportunity to ask questions and all were answered. The patient agreed with the plan and demonstrated an understanding of the instructions.   The patient was advised to call back or seek an in-person evaluation if the symptoms worsen or if the condition fails to improve as anticipated.  I provided 15  minutes of non-face-to-face time during this encounter.  Loistine Chance, MD

## 2019-07-18 NOTE — Telephone Encounter (Signed)
Called to see if Dr. Arvella Nigh would like to continue giving Gabapentin to the patient since he just gave prescribed her 30 pills. Wants to know if he would take over care for this medication since she is used to getting 240 capsules per month for her chronic leg pain or if Dr. Ancil Boozer should take over prescribing her Gabapentin.   Dr. Arvella Nigh is in surgery today until 3 p.m. and they will send this message to him, they said hopefully we will received an answer by the end of today or tomorrow morning.

## 2019-07-18 NOTE — Telephone Encounter (Signed)
Mickel Baas called to speak with [name] regarding patient.  Please return call at [phone]

## 2019-07-18 NOTE — Telephone Encounter (Signed)
Spoke with Wendy Johnson at [facility] and Dr. Kasandra Knudsen states it would be ok for Dr. Ancil Boozer to prescribe patient Gabapentin for pain management.

## 2019-07-26 ENCOUNTER — Other Ambulatory Visit
Admission: RE | Admit: 2019-07-26 | Discharge: 2019-07-26 | Disposition: A | Discharge status: Home / Self Care | Payer: Medicare Other | Source: Ambulatory Visit | Attending: Gastroenterology | Admitting: Gastroenterology

## 2019-07-26 ENCOUNTER — Other Ambulatory Visit: Payer: Self-pay

## 2019-07-26 DIAGNOSIS — Z01812 Encounter for preprocedural laboratory examination: Secondary | ICD-10-CM | POA: Insufficient documentation

## 2019-07-26 DIAGNOSIS — Z20822 Contact with and (suspected) exposure to covid-19: Secondary | ICD-10-CM | POA: Insufficient documentation

## 2019-07-27 LAB — SARS CORONAVIRUS 2 (TAT 6-24 HRS): SARS Coronavirus 2: NEGATIVE

## 2019-07-28 DIAGNOSIS — J449 Chronic obstructive pulmonary disease, unspecified: Secondary | ICD-10-CM | POA: Diagnosis not present

## 2019-07-30 ENCOUNTER — Ambulatory Visit: Payer: Medicare Other | Admitting: Anesthesiology

## 2019-07-30 ENCOUNTER — Encounter: Payer: Self-pay | Admitting: Gastroenterology

## 2019-07-30 ENCOUNTER — Telehealth: Payer: Self-pay | Admitting: Anesthesiology

## 2019-07-30 ENCOUNTER — Telehealth: Payer: Self-pay | Admitting: *Deleted

## 2019-07-30 ENCOUNTER — Encounter
Admission: RE | Disposition: A | Discharge status: Home / Self Care | Payer: Self-pay | Source: Home / Self Care | Attending: Gastroenterology

## 2019-07-30 ENCOUNTER — Ambulatory Visit
Admission: RE | Admit: 2019-07-30 | Discharge: 2019-07-30 | Disposition: A | Discharge status: Home / Self Care | Payer: Medicare Other | Attending: Gastroenterology | Admitting: Gastroenterology

## 2019-07-30 DIAGNOSIS — Z79899 Other long term (current) drug therapy: Secondary | ICD-10-CM | POA: Diagnosis not present

## 2019-07-30 DIAGNOSIS — H9191 Unspecified hearing loss, right ear: Secondary | ICD-10-CM | POA: Diagnosis not present

## 2019-07-30 DIAGNOSIS — E119 Type 2 diabetes mellitus without complications: Secondary | ICD-10-CM | POA: Diagnosis not present

## 2019-07-30 DIAGNOSIS — Z8719 Personal history of other diseases of the digestive system: Secondary | ICD-10-CM | POA: Diagnosis not present

## 2019-07-30 DIAGNOSIS — F319 Bipolar disorder, unspecified: Secondary | ICD-10-CM | POA: Insufficient documentation

## 2019-07-30 DIAGNOSIS — F419 Anxiety disorder, unspecified: Secondary | ICD-10-CM | POA: Diagnosis not present

## 2019-07-30 DIAGNOSIS — Z96651 Presence of right artificial knee joint: Secondary | ICD-10-CM | POA: Insufficient documentation

## 2019-07-30 DIAGNOSIS — M549 Dorsalgia, unspecified: Secondary | ICD-10-CM | POA: Insufficient documentation

## 2019-07-30 DIAGNOSIS — J449 Chronic obstructive pulmonary disease, unspecified: Secondary | ICD-10-CM | POA: Insufficient documentation

## 2019-07-30 DIAGNOSIS — I10 Essential (primary) hypertension: Secondary | ICD-10-CM | POA: Diagnosis not present

## 2019-07-30 DIAGNOSIS — R1310 Dysphagia, unspecified: Secondary | ICD-10-CM | POA: Diagnosis not present

## 2019-07-30 DIAGNOSIS — Z860100 Personal history of colon polyps, unspecified: Secondary | ICD-10-CM

## 2019-07-30 DIAGNOSIS — E785 Hyperlipidemia, unspecified: Secondary | ICD-10-CM | POA: Insufficient documentation

## 2019-07-30 DIAGNOSIS — G4700 Insomnia, unspecified: Secondary | ICD-10-CM | POA: Insufficient documentation

## 2019-07-30 DIAGNOSIS — K219 Gastro-esophageal reflux disease without esophagitis: Secondary | ICD-10-CM | POA: Insufficient documentation

## 2019-07-30 DIAGNOSIS — Z6841 Body Mass Index (BMI) 40.0 and over, adult: Secondary | ICD-10-CM | POA: Diagnosis not present

## 2019-07-30 DIAGNOSIS — E7800 Pure hypercholesterolemia, unspecified: Secondary | ICD-10-CM | POA: Insufficient documentation

## 2019-07-30 DIAGNOSIS — G4730 Sleep apnea, unspecified: Secondary | ICD-10-CM | POA: Insufficient documentation

## 2019-07-30 DIAGNOSIS — G8929 Other chronic pain: Secondary | ICD-10-CM | POA: Diagnosis not present

## 2019-07-30 DIAGNOSIS — F17210 Nicotine dependence, cigarettes, uncomplicated: Secondary | ICD-10-CM | POA: Diagnosis not present

## 2019-07-30 DIAGNOSIS — F909 Attention-deficit hyperactivity disorder, unspecified type: Secondary | ICD-10-CM | POA: Insufficient documentation

## 2019-07-30 DIAGNOSIS — K228 Other specified diseases of esophagus: Secondary | ICD-10-CM | POA: Diagnosis not present

## 2019-07-30 DIAGNOSIS — R1319 Other dysphagia: Secondary | ICD-10-CM

## 2019-07-30 DIAGNOSIS — Z96643 Presence of artificial hip joint, bilateral: Secondary | ICD-10-CM | POA: Insufficient documentation

## 2019-07-30 DIAGNOSIS — K640 First degree hemorrhoids: Secondary | ICD-10-CM | POA: Diagnosis not present

## 2019-07-30 DIAGNOSIS — Z09 Encounter for follow-up examination after completed treatment for conditions other than malignant neoplasm: Secondary | ICD-10-CM | POA: Diagnosis not present

## 2019-07-30 HISTORY — PX: ESOPHAGOGASTRODUODENOSCOPY (EGD) WITH PROPOFOL: SHX5813

## 2019-07-30 HISTORY — PX: COLONOSCOPY WITH PROPOFOL: SHX5780

## 2019-07-30 SURGERY — COLONOSCOPY WITH PROPOFOL
Anesthesia: General

## 2019-07-30 MED ORDER — FENTANYL CITRATE (PF) 100 MCG/2ML IJ SOLN
INTRAMUSCULAR | Status: AC
  Filled 2019-07-30: qty 2

## 2019-07-30 MED ORDER — SODIUM CHLORIDE 0.9 % IV SOLN: INTRAVENOUS | Status: DC

## 2019-07-30 MED ORDER — PROPOFOL 500 MG/50ML IV EMUL
INTRAVENOUS | Status: AC
  Filled 2019-07-30: qty 50

## 2019-07-30 MED ORDER — PROPOFOL 500 MG/50ML IV EMUL
INTRAVENOUS | Status: DC | PRN
  Administered 2019-07-30: 120 ug/kg/min via INTRAVENOUS

## 2019-07-30 MED ORDER — OXYCODONE HCL 10 MG PO TABS: 10.0000 mg | ORAL_TABLET | Freq: Four times a day (QID) | ORAL | 0 refills | Status: DC

## 2019-07-30 MED ORDER — MIDAZOLAM HCL 2 MG/2ML IJ SOLN
INTRAMUSCULAR | Status: DC | PRN
  Administered 2019-07-30: 2 mg via INTRAVENOUS

## 2019-07-30 MED ORDER — FENTANYL CITRATE (PF) 100 MCG/2ML IJ SOLN
INTRAMUSCULAR | Status: DC | PRN
  Administered 2019-07-30: 50 ug via INTRAVENOUS

## 2019-07-30 MED ORDER — MIDAZOLAM HCL 2 MG/2ML IJ SOLN
INTRAMUSCULAR | Status: AC
  Filled 2019-07-30: qty 2

## 2019-07-30 NOTE — Telephone Encounter (Signed)
Patient called stating she spoke with Dr. Andree Elk this morning at her surgery appt. She told him she was going to be 3 days short of her meds and he told her he would make it right. She is calling to find out if this has been done.

## 2019-07-30 NOTE — Op Note (Addendum)
[facility] Gastroenterology Patient Name: Wendy Johnson Procedure Date: 07/30/2019 10:01 AM MRN: [id number] Account #: [id number] Date of Birth: May 04, 1966 Admit Type: Outpatient Age: 54 Room: [facility] ENDO ROOM 4 Gender: Female Note Status: Finalized Procedure:             Upper GI endoscopy Indications:           Dysphagia Providers:             Lucilla Lame MD, MD Referring MD:          No Local Md, MD (Referring MD) Medicines:             Propofol per Anesthesia Complications:         No immediate complications. Procedure:             Pre-Anesthesia Assessment:                        - Prior to the procedure, a History and Physical was                         performed, and patient medications and allergies were                         reviewed. The patient's tolerance of previous                         anesthesia was also reviewed. The risks and benefits                         of the procedure and the sedation options and risks                         were discussed with the patient. All questions were                         answered, and informed consent was obtained. Prior                         Anticoagulants: The patient has taken no previous                         anticoagulant or antiplatelet agents. ASA Grade                         Assessment: II - A patient with mild systemic disease.                         After reviewing the risks and benefits, the patient                         was deemed in satisfactory condition to undergo the                         procedure.                        After obtaining informed consent, the endoscope was  passed under direct vision. Throughout the procedure,                         the patient's blood pressure, pulse, and oxygen                         saturations were monitored continuously. The Endoscope                         was introduced through the mouth, and advanced to the                    second part of duodenum. The upper GI endoscopy was                         accomplished without difficulty. The patient tolerated                         the procedure well. Findings:      The Z-line was irregular and was found at the gastroesophageal junction.      Biopsies were obtained with cold forceps for histology in the middle       third of the esophagus.      The stomach was normal.      The examined duodenum was normal. Impression:            - Z-line irregular, at the gastroesophageal junction.                        - Normal stomach.                        - Normal examined duodenum.                        - Biopsy performed in the middle third of the                         esophagus. Recommendation:        - Discharge patient to home.                        - Resume previous diet.                        - Continue present medications.                        - Await pathology results.                        - Perform a colonoscopy today. Procedure Code(s):     --- Professional ---                        [id number], Esophagogastroduodenoscopy, flexible,                         transoral; with biopsy, single or multiple Diagnosis Code(s):     --- Professional ---                        R13.10, Dysphagia, unspecified  K22.8, Other specified diseases of esophagus CPT copyright 2019 American Medical Association. All rights reserved. The codes documented in this report are preliminary and upon coder review may  be revised to meet current compliance requirements. Lucilla Lame MD, MD 07/30/2019 10:28:45 AM This report has been signed electronically. Number of Addenda: 0 Note Initiated On: 07/30/2019 10:01 AM Estimated Blood Loss:  Estimated blood loss: none.      [facility]

## 2019-07-30 NOTE — Telephone Encounter (Signed)
Called patient to let her know that Dr Andree Elk has sent in Rx that was requested for oxycodone to fill on 08/16/19

## 2019-07-30 NOTE — Addendum Note (Signed)
Addended by: Molli Barrows on: 07/30/2019 12:07 PM   Modules accepted: Orders

## 2019-07-30 NOTE — Anesthesia Preprocedure Evaluation (Signed)
Anesthesia Evaluation  Patient identified by MRN, date of birth, ID band Patient awake    Reviewed: Allergy & Precautions, H&P , NPO status , Patient's Chart, lab work & pertinent test results, reviewed documented beta blocker date and time   Airway Mallampati: III   Neck ROM: full    Dental  (+) Poor Dentition   Pulmonary neg pulmonary ROS, shortness of breath and with exertion, sleep apnea , COPD,  COPD inhaler, Current Smoker,    Pulmonary exam normal        Cardiovascular Exercise Tolerance: Poor hypertension, On Medications negative cardio ROS Normal cardiovascular exam Rhythm:regular Rate:Normal     Neuro/Psych  Headaches, PSYCHIATRIC DISORDERS Anxiety Depression Bipolar Disorder  Neuromuscular disease negative psych ROS   GI/Hepatic Neg liver ROS, GERD  Medicated,  Endo/Other  negative endocrine ROSdiabetes, Well Controlled, Type 2, Oral Hypoglycemic Agents  Renal/GU negative Renal ROS  negative genitourinary   Musculoskeletal   Abdominal   Peds  Hematology negative hematology ROS (+)   Anesthesia Other Findings Past Medical History: No date: ADHD (attention deficit hyperactivity disorder) No date: Anxiety No date: AR (allergic rhinitis) No date: Arthritis No date: Benign hypertension     Comment:  NO MEDS No date: Bipolar disorder (HCC) No date: Chronic back pain No date: Chronic constipation No date: Chronic insomnia No date: COPD (chronic obstructive pulmonary disease) (HCC) No date: Deaf     Comment:  RIGHT EAR No date: Decreased dorsalis pedis pulse No date: Depression No date: Diabetes mellitus without complication (HCC)     Comment:  history of DM but no longer has No date: Dyslipidemia No date: Fatty liver No date: GERD (gastroesophageal reflux disease) No date: Hepatomegaly No date: High cholesterol No date: Hot flashes No date: Migraine with aura No date: OCD (obsessive compulsive  disorder) 12/19/2018: Pain in joint, shoulder region No date: Plantar warts 02/18/2019: Sciatica, right side No date: Severe obesity (HCC) No date: Shortness of breath dyspnea No date: Sleep apnea     Comment:  NO CPAP No date: Tobacco use No date: Trochanteric bursitis of right hip Past Surgical History: 10/20/2014: ABDOMINAL HYSTERECTOMY; N/A     Comment:  Procedure: Total abdominial hysterectomy, bilateral               salpingo-oophorectomy;  Surgeon: Brayton Mars,               MD;  Location: [facility] ORS;  Service: Gynecology;                Laterality: N/A; No date: BILATERAL SALPINGOOPHORECTOMY No date: bone spurs removed; Bilateral 06/08/2017: CARPAL TUNNEL RELEASE; Left     Comment:  Procedure: CARPAL TUNNEL RELEASE;  Surgeon: Hessie Knows, MD;  Location: [facility] ORS;  Service: Orthopedics;               Laterality: Left; 10/10/2017: CARPAL TUNNEL RELEASE; Right     Comment:  Procedure: CARPAL TUNNEL RELEASE;  Surgeon: Hessie Knows, MD;  Location: [facility] ORS;  Service: Orthopedics;               Laterality: Right; 09/01/2016: COLONOSCOPY WITH PROPOFOL; N/A     Comment:  Procedure: COLONOSCOPY WITH PROPOFOL;  Surgeon: Jonathon Bellows, MD;  Location: [facility] ENDOSCOPY;  Service: Endoscopy;              Laterality: N/A; 06/08/2017: DORSAL COMPARTMENT RELEASE; Left     Comment:  Procedure: RELEASE DORSAL COMPARTMENT (DEQUERVAIN);                Surgeon: Hessie Knows, MD;  Location: [facility] ORS;                Service: Orthopedics;  Laterality: Left; 09/01/2016: ESOPHAGOGASTRODUODENOSCOPY (EGD) WITH PROPOFOL; N/A     Comment:  Procedure: ESOPHAGOGASTRODUODENOSCOPY (EGD) WITH               PROPOFOL;  Surgeon: Jonathon Bellows, MD;  Location: [facility]               ENDOSCOPY;  Service: Endoscopy;  Laterality: N/A; No date: FOOT SURGERY; Bilateral 12/26/2017: INSERTION OF MESH; N/A     Comment:  Procedure: INSERTION OF MESH;  Surgeon: Jules Husbands,                MD;  Location: [facility] ORS;  Service: General;  Laterality:               N/A; No date: JOINT REPLACEMENT; Right     Comment:  Total Knee replacement X 2 No date: JOINT REPLACEMENT; Bilateral     Comment:  Total Hip Replacement No date: knee arthroscopo; Right 09/22/2014: LAPAROSCOPY     Comment:  Procedure: LAPAROSCOPY OPERATIVE;  Surgeon: Brayton Mars, MD;  Location: [facility] ORS;  Service:               Gynecology;;  excision and fulgeration of endomertriosis No date: LIPOMA EXCISION 12/26/2017: ROBOTIC ASSISTED LAPAROSCOPIC VENTRAL/INCISIONAL HERNIA  REPAIR; N/A     Comment:  Procedure: ROBOTIC ASSISTED LAPAROSCOPIC               [place];  Surgeon: Jules Husbands, MD;  Location: [facility] ORS;  Service: General;                Laterality: N/A; No date: TONSILLECTOMY 12/16/2014: TOTAL HIP ARTHROPLASTY; Left     Comment:  Procedure: TOTAL HIP ARTHROPLASTY ANTERIOR APPROACH;                Surgeon: Hessie Knows, MD;  Location: [facility] ORS;  Service:              Orthopedics;  Laterality: Left; 05/12/2015: TOTAL HIP ARTHROPLASTY; Right     Comment:  Procedure: TOTAL HIP ARTHROPLASTY ANTERIOR APPROACH;                Surgeon: Hessie Knows, MD;  Location: [facility] ORS;  Service:              Orthopedics;  Laterality: Right; 09/22/2015: TOTAL KNEE REVISION; Right     Comment:  Procedure: TOTAL KNEE REVISION/ REVISE POLYIETHYLENE;                Surgeon: Hessie Knows, MD;  Location: [facility] ORS;  Service:              Orthopedics;  Laterality: Right; 10/11/2016: TOTAL KNEE REVISION; Right     Comment:  Procedure: TOTAL KNEE REVISION;  Surgeon: Hessie Knows,              MD;  Location: [facility] ORS;  Service: Orthopedics;  Laterality: Right; No date: TUBAL LIGATION 11/08/2018: VENTRAL HERNIA REPAIR; N/A     Comment:  Procedure: HERNIA REPAIR VENTRAL ADULT OPEN. DIABETIC,               SLEEP APNEA;  Surgeon: Jules Husbands, MD;  Location:                [facility] ORS;  Service: General;  Laterality: N/A;   Reproductive/Obstetrics negative OB ROS                             Anesthesia Physical Anesthesia Plan  ASA: III  Anesthesia Plan: General   Post-op Pain Management:    Induction:   PONV Risk Score and Plan:   Airway Management Planned:   Additional Equipment:   Intra-op Plan:   Post-operative Plan:   Informed Consent: I have reviewed the patients History and Physical, chart, labs and discussed the procedure including the risks, benefits and alternatives for the proposed anesthesia with the patient or authorized representative who has indicated his/her understanding and acceptance.     Dental Advisory Given  Plan Discussed with: CRNA  Anesthesia Plan Comments:         Anesthesia Quick Evaluation

## 2019-07-30 NOTE — Telephone Encounter (Signed)
Patient called the office to tell us that Dr. Andree Elk prescribed the incorrect medication today. I called Dr. Andree Elk, he will correct this. I called [facility] to cancel the incorrect prescriptions. Patient notified.

## 2019-07-30 NOTE — Interval H&P Note (Signed)
History and Physical Interval Note:  07/30/2019 9:37 AM  Wendy Johnson  has presented today for surgery, with the diagnosis of R13.10 esophaeal Dysphagia Ordynophagia for EGD Colonoscopy Z86.010.  The various methods of treatment have been discussed with the patient and family. After consideration of risks, benefits and other options for treatment, the patient has consented to  Procedure(s): COLONOSCOPY WITH PROPOFOL (N/A) ESOPHAGOGASTRODUODENOSCOPY (EGD) WITH PROPOFOL (N/A) as a surgical intervention.  The patient's history has been reviewed, patient examined, no change in status, stable for surgery.  I have reviewed the patient's chart and labs.  Questions were answered to the patient's satisfaction.     Jaythen Hamme [employer]

## 2019-07-30 NOTE — Transfer of Care (Signed)
Immediate Anesthesia Transfer of Care Note  Patient: Wendy Johnson  Procedure(s) Performed: COLONOSCOPY WITH PROPOFOL (N/A ) ESOPHAGOGASTRODUODENOSCOPY (EGD) WITH PROPOFOL (N/A )  Patient Location: PACU  Anesthesia Type:General  Level of Consciousness: awake  Airway & Oxygen Therapy: Patient Spontanous Breathing and Patient connected to face mask oxygen  Post-op Assessment: Report given to RN and Post -op Vital signs reviewed and stable  Post vital signs: Reviewed and stable  Last Vitals:  Vitals Value Taken Time  BP    Temp    Pulse    Resp    SpO2      Last Pain:  Vitals:   07/30/19 0924  TempSrc: Temporal  PainSc: 6          Complications: No apparent anesthesia complications

## 2019-07-30 NOTE — Op Note (Addendum)
[facility] Gastroenterology Patient Name: Wendy Johnson Procedure Date: 07/30/2019 10:01 AM MRN: [id number] Account #: [id number] Date of Birth: 01-04-1966 Admit Type: Outpatient Age: 54 Room: [facility] ENDO ROOM 4 Gender: Female Note Status: Finalized Procedure:             Colonoscopy Indications:           High risk colon cancer surveillance: Personal history                         of colonic polyps Providers:             Lucilla Lame MD, MD Referring MD:          No Local Md, MD (Referring MD) Medicines:             Propofol per Anesthesia Complications:         No immediate complications. Procedure:             Pre-Anesthesia Assessment:                        - Prior to the procedure, a History and Physical was                         performed, and patient medications and allergies were                         reviewed. The patient's tolerance of previous                         anesthesia was also reviewed. The risks and benefits                         of the procedure and the sedation options and risks                         were discussed with the patient. All questions were                         answered, and informed consent was obtained. Prior                         Anticoagulants: The patient has taken no previous                         anticoagulant or antiplatelet agents. ASA Grade                         Assessment: II - A patient with mild systemic disease.                         After reviewing the risks and benefits, the patient                         was deemed in satisfactory condition to undergo the                         procedure.  After obtaining informed consent, the colonoscope was                         passed under direct vision. Throughout the procedure,                         the patient's blood pressure, pulse, and oxygen                         saturations were monitored continuously. The             Colonoscope was introduced through the anus and                         advanced to the the cecum, identified by appendiceal                         orifice and ileocecal valve. The colonoscopy was                         performed without difficulty. The patient tolerated                         the procedure well. The quality of the bowel                         preparation was excellent. Findings:      The perianal and digital rectal examinations were normal.      Non-bleeding internal hemorrhoids were found during retroflexion. The       hemorrhoids were Grade I (internal hemorrhoids that do not prolapse). Impression:            - Non-bleeding internal hemorrhoids.                        - No specimens collected. Recommendation:        - Discharge patient to home.                        - Resume previous diet.                        - Continue present medications.                        - Repeat colonoscopy in 5 years for surveillance. Procedure Code(s):     --- Professional ---                        [phone], Colonoscopy, flexible; diagnostic, including                         collection of specimen(s) by brushing or washing, when                         performed (separate procedure) Diagnosis Code(s):     --- Professional ---                        Z86.010, Personal history of colonic polyps CPT copyright 2019 American Medical Association. All rights reserved. The codes documented in  this report are preliminary and upon coder review may  be revised to meet current compliance requirements. Lucilla Lame MD, MD 07/30/2019 10:45:31 AM This report has been signed electronically. Number of Addenda: 0 Note Initiated On: 07/30/2019 10:01 AM Scope Withdrawal Time: 0 hours 8 minutes 12 seconds  Total Procedure Duration: 0 hours 13 minutes 2 seconds  Estimated Blood Loss:  Estimated blood loss: none.      [facility]

## 2019-07-31 ENCOUNTER — Ambulatory Visit: Payer: Medicare Other | Admitting: Gastroenterology

## 2019-07-31 ENCOUNTER — Encounter: Payer: Self-pay | Admitting: *Deleted

## 2019-07-31 LAB — SURGICAL PATHOLOGY

## 2019-07-31 NOTE — Anesthesia Postprocedure Evaluation (Signed)
Anesthesia Post Note  Patient: Wendy Johnson  Procedure(s) Performed: COLONOSCOPY WITH PROPOFOL (N/A ) ESOPHAGOGASTRODUODENOSCOPY (EGD) WITH PROPOFOL (N/A )  Patient location during evaluation: PACU Anesthesia Type: General Level of consciousness: awake and alert Pain management: pain level controlled Vital Signs Assessment: post-procedure vital signs reviewed and stable Respiratory status: spontaneous breathing, nonlabored ventilation, respiratory function stable and patient connected to nasal cannula oxygen Cardiovascular status: blood pressure returned to baseline and stable Postop Assessment: no apparent nausea or vomiting Anesthetic complications: no     Last Vitals:  Vitals:   07/30/19 1110 07/30/19 1120  BP: 119/80 (!) 131/93  Pulse: 78 66  Resp: 17 (!) 30  Temp:    SpO2: 92% 91%    Last Pain:  Vitals:   07/30/19 1120  TempSrc:   PainSc: 0-No pain                 Molli Barrows

## 2019-08-01 ENCOUNTER — Encounter: Payer: Self-pay | Admitting: Gastroenterology

## 2019-08-05 ENCOUNTER — Telehealth: Payer: Self-pay | Admitting: Family Medicine

## 2019-08-05 NOTE — Telephone Encounter (Signed)
Copied from [place] [id number]. Topic: Quick Communication - Rx Refill/Question >> Aug 05, 2019 10:49 AM Yvette Rack wrote: Medication: DEXILANT 60 MG capsule  Has the patient contacted their pharmacy? no  Preferred Pharmacy (with phone number or street name): [place], [place] Phone: [phone]  Fax: [phone]  Agent: Please be advised that RX refills may take up to 3 business days. We ask that you follow-up with your pharmacy.

## 2019-08-05 NOTE — Telephone Encounter (Signed)
Not an active med. Was dc'd 04/25/19 Routing to office

## 2019-08-05 NOTE — Telephone Encounter (Signed)
Patient called requesting refill on Dexilant. I called patient and told her there had been a change in therapy and she was switched to Nexium BID because she had been doubling up on the [place]. She reports that the [place] works better for her and she does not throw up when taking the [place]. She wants a rx sent to [facility]. Please advise.

## 2019-08-06 ENCOUNTER — Other Ambulatory Visit: Payer: Self-pay | Admitting: Family Medicine

## 2019-08-06 NOTE — Telephone Encounter (Signed)
Called Wendy Johnson. She would like for you to send Dexilant for once daily. She said this is what works best for her.

## 2019-08-06 NOTE — Telephone Encounter (Signed)
Patient is requesting Dexilant for her GERD. Are you able to refill this for her.

## 2019-08-12 MED ORDER — OMEPRAZOLE 40 MG PO CPDR: 40.0000 mg | DELAYED_RELEASE_CAPSULE | Freq: Two times a day (BID) | ORAL | 0 refills | Status: DC

## 2019-08-12 NOTE — Addendum Note (Signed)
Addended by: Ulyess Blossom L on: 08/12/2019 08:18 AM   Modules accepted: Orders

## 2019-08-12 NOTE — Telephone Encounter (Signed)
Refilled medication for omeprazole per Dr. Bonna Gains orders. Sent to pharmacy

## 2019-08-19 ENCOUNTER — Ambulatory Visit: Payer: Medicare Other | Attending: Anesthesiology | Admitting: Anesthesiology

## 2019-08-19 ENCOUNTER — Other Ambulatory Visit: Payer: Self-pay

## 2019-08-19 ENCOUNTER — Encounter: Payer: Self-pay | Admitting: Anesthesiology

## 2019-08-19 DIAGNOSIS — F1190 Opioid use, unspecified, uncomplicated: Secondary | ICD-10-CM | POA: Diagnosis not present

## 2019-08-19 DIAGNOSIS — M25551 Pain in right hip: Secondary | ICD-10-CM

## 2019-08-19 DIAGNOSIS — M47816 Spondylosis without myelopathy or radiculopathy, lumbar region: Secondary | ICD-10-CM

## 2019-08-19 DIAGNOSIS — M5432 Sciatica, left side: Secondary | ICD-10-CM

## 2019-08-19 DIAGNOSIS — M545 Low back pain, unspecified: Secondary | ICD-10-CM

## 2019-08-19 DIAGNOSIS — G894 Chronic pain syndrome: Secondary | ICD-10-CM

## 2019-08-19 DIAGNOSIS — M25552 Pain in left hip: Secondary | ICD-10-CM

## 2019-08-19 DIAGNOSIS — M5431 Sciatica, right side: Secondary | ICD-10-CM

## 2019-08-19 DIAGNOSIS — M542 Cervicalgia: Secondary | ICD-10-CM

## 2019-08-19 DIAGNOSIS — M5136 Other intervertebral disc degeneration, lumbar region: Secondary | ICD-10-CM | POA: Diagnosis not present

## 2019-08-19 MED ORDER — CYCLOBENZAPRINE HCL 10 MG PO TABS: 10.0000 mg | ORAL_TABLET | Freq: Three times a day (TID) | ORAL | 3 refills | Status: AC | PRN

## 2019-08-19 MED ORDER — OXYCODONE HCL 10 MG PO TABS: 10.0000 mg | ORAL_TABLET | Freq: Four times a day (QID) | ORAL | 0 refills | Status: DC

## 2019-08-19 MED ORDER — OXYCODONE HCL 10 MG PO TABS: 10.0000 mg | ORAL_TABLET | Freq: Four times a day (QID) | ORAL | 0 refills | Status: AC

## 2019-08-19 NOTE — Progress Notes (Signed)
Virtual Visit via Telephone Note  I connected with Wendy Johnson on 08/19/19 at 11:45 AM EDT by telephone and verified that I am speaking with the correct person using two identifiers.  Location: Patient: Home Provider: Pain control center   I discussed the limitations, risks, security and privacy concerns of performing an evaluation and management service by telephone and the availability of in person appointments. I also discussed with the patient that there may be a patient responsible charge related to this service. The patient expressed understanding and agreed to proceed.   History of Present Illness: I spoke with Wendy Johnson via telephone as she was unable to do the video portion of the virtual conference.  After identification she reports that she did very well with her last epidural.  She had about 2 to 3 weeks of almost complete pain relief and then had approximately 70% relief for the next 3 to 5 weeks.  She generally gets recurrence of the same quality characteristic and distribution of low back pain and right and left lateral and posterior lateral leg pain with associated cramping about 6 weeks after her injection.  Unfortunately nothing else has given her longer-term relief.  She has been deemed a nonsurgical candidate at this point.  Otherwise the quality characteristic and distribution of pain have been stable in nature.  She also has bilateral hip pain and chronic bilateral knee pain following multiple surgeries.  The quality of her low back pain is been stable and no changes in bowel or bladder function or leg strength are reported.  She mentions that the medications work well for her with no side effects reported and she continues to derive good functional lifestyle provement with the medicines.  Otherwise she is in her usual state of health.  For the spasms in her low back she takes Flexeril and she is currently out of this    Observations/Objective:  Current Outpatient Medications:  .   albuterol (PROVENTIL) (2.5 MG/3ML) 0.083% nebulizer solution, Take 3 mLs (2.5 mg total) by nebulization every 6 (six) hours as needed for wheezing or shortness of breath., Disp: 75 mL, Rfl: 1 .  amphetamine-dextroamphetamine (ADDERALL) 15 MG tablet, Take 15 mg by mouth daily. , Disp: , Rfl:  .  amphetamine-dextroamphetamine (ADDERALL) 30 MG tablet, Take 30 mg by mouth 2 (two) times daily., Disp: , Rfl:  .  atorvastatin (LIPITOR) 40 MG tablet, Take 1 tablet (40 mg total) by mouth daily., Disp: 90 tablet, Rfl: 1 .  busPIRone (BUSPAR) 30 MG tablet, Take 30 mg by mouth 2 (two) times daily. , Disp: , Rfl:  .  clonazePAM (KLONOPIN) 1 MG tablet, Take 1 mg by mouth 6 (six) times daily. , Disp: , Rfl:  .  cyclobenzaprine (FLEXERIL) 10 MG tablet, Take 1 tablet (10 mg total) by mouth 3 (three) times daily as needed for muscle spasms., Disp: 90 tablet, Rfl: 3 .  gabapentin (NEURONTIN) 300 MG capsule, Take by mouth., Disp: , Rfl:  .  hydrALAZINE (APRESOLINE) 10 MG tablet, Take 1 tablet (10 mg total) by mouth 3 (three) times daily. Prn bp above 140/90, Disp: 90 tablet, Rfl: 0 .  hydrOXYzine (ATARAX/VISTARIL) 25 MG tablet, Take 25 mg by mouth every 6 (six) hours as needed for anxiety or itching (hives)., Disp: , Rfl:  .  ibuprofen (ADVIL) 800 MG tablet, TAKE 1 TABLET BY MOUTH EVERY 8 HOURS AS NEEDED., Disp: 90 tablet, Rfl: 0 .  LATUDA 40 MG TABS tablet, Take 40 mg by mouth daily  with breakfast. , Disp: , Rfl:  .  omega-3 acid ethyl esters (LOVAZA) 1 g capsule, TAKE TWO CAPSULES BY MOUTH TWICE A DAY, Disp: 360 capsule, Rfl: 0 .  omeprazole (PRILOSEC) 40 MG capsule, Take 1 capsule (40 mg total) by mouth in the morning and at bedtime., Disp: 60 capsule, Rfl: 0 .  [START ON 09/15/2019] Oxycodone HCl 10 MG TABS, Take 1 tablet (10 mg total) by mouth 4 (four) times daily., Disp: 120 tablet, Rfl: 0 .  [START ON 10/15/2019] Oxycodone HCl 10 MG TABS, Take 1 tablet (10 mg total) by mouth 4 (four) times daily., Disp: 120 tablet,  Rfl: 0 .  PROAIR HFA 108 (90 Base) MCG/ACT inhaler, Inhale 2 puffs into the lungs every 4 (four) hours as needed for wheezing or shortness of breath., Disp: 8.5 g, Rfl: 0 .  sertraline (ZOLOFT) 100 MG tablet, Take 100 mg by mouth 2 (two) times daily., Disp: , Rfl:  .  SPIRIVA HANDIHALER 18 MCG inhalation capsule, PLACE 1 CAPSULE INTO INHALER AND INHALE DAILY, Disp: 30 capsule, Rfl: 5 .  traZODone (DESYREL) 100 MG tablet, Take 400 mg by mouth at bedtime. , Disp: , Rfl:   Assessment and Plan: 1. DDD (degenerative disc disease), lumbar   2. Low back pain at multiple sites   3. Chronic, continuous use of opioids   4. Chronic pain syndrome   5. Sciatica, right side   6. Sciatica of left side   7. Pain of both hip joints   8. Cervicalgia   9. Facet arthritis of lumbar region   Based on our discussion today and upon review of the [facility] practitioner database information I think it is appropriate to go ahead and refill her medications for May 9 and June 8.  These will be filled today.  I am also refilling her Flexeril for 10 mg tablets 3 times a day for the next few months.  I will schedule her for return to clinic in 2 months for a repeat epidural which is consistent with her standard protocol.  I want her to continue with efforts at stretching strengthening exercises per baseline.  She is to continue following up with her orthopedic physicians for her other associated orthopedic problems and continue follow-up with her primary care physicians for baseline medical care.  She is instructed to contact us at the pain control center should she have any questions regarding her pain management regimen.  Follow Up Instructions:    I discussed the assessment and treatment plan with the patient. The patient was provided an opportunity to ask questions and all were answered. The patient agreed with the plan and demonstrated an understanding of the instructions.   The patient was advised to call back or  seek an in-person evaluation if the symptoms worsen or if the condition fails to improve as anticipated.  I provided 30 minutes of non-face-to-face time during this encounter.   Molli Barrows, MD

## 2019-08-28 DIAGNOSIS — J449 Chronic obstructive pulmonary disease, unspecified: Secondary | ICD-10-CM | POA: Diagnosis not present

## 2019-08-29 ENCOUNTER — Telehealth: Payer: Self-pay | Admitting: Family Medicine

## 2019-08-29 NOTE — Telephone Encounter (Signed)
Spoke with patient sent stated she was on her way to the beach and did not have her calendar to schedule at this time.  Call back next week

## 2019-09-10 ENCOUNTER — Other Ambulatory Visit: Payer: Self-pay | Admitting: Family Medicine

## 2019-09-11 ENCOUNTER — Telehealth: Payer: Self-pay

## 2019-09-11 NOTE — Telephone Encounter (Signed)
Pharmacy called and informed. Patient called and LVM that script could be filled on Saturday May 8.

## 2019-09-11 NOTE — Telephone Encounter (Signed)
She states her drug store is closed on Sunday the ninth and that's when her script is due.  She wants someone to call them and let them know its ok for her to get them on Saturday the 8th.

## 2019-09-24 ENCOUNTER — Ambulatory Visit: Payer: Medicare Other

## 2019-09-27 DIAGNOSIS — J449 Chronic obstructive pulmonary disease, unspecified: Secondary | ICD-10-CM | POA: Diagnosis not present

## 2019-10-01 ENCOUNTER — Other Ambulatory Visit: Payer: Self-pay | Admitting: Family Medicine

## 2019-10-01 DIAGNOSIS — Z1231 Encounter for screening mammogram for malignant neoplasm of breast: Secondary | ICD-10-CM

## 2019-10-03 ENCOUNTER — Ambulatory Visit
Admission: RE | Admit: 2019-10-03 | Discharge: 2019-10-03 | Disposition: A | Discharge status: Home / Self Care | Payer: Medicare Other | Source: Ambulatory Visit | Attending: Family Medicine | Admitting: Family Medicine

## 2019-10-03 DIAGNOSIS — Z1231 Encounter for screening mammogram for malignant neoplasm of breast: Secondary | ICD-10-CM | POA: Diagnosis not present

## 2019-10-04 ENCOUNTER — Telehealth: Payer: Self-pay | Admitting: Family Medicine

## 2019-10-04 DIAGNOSIS — M7061 Trochanteric bursitis, right hip: Secondary | ICD-10-CM | POA: Diagnosis not present

## 2019-10-04 DIAGNOSIS — M7062 Trochanteric bursitis, left hip: Secondary | ICD-10-CM | POA: Diagnosis not present

## 2019-10-04 NOTE — Telephone Encounter (Signed)
Patient is calling stating she is in need of a letter from Dr. Ancil Boozer that she is disabled for the renewal for her handicap decal. Patient states that she did not receive forms for completion from the [facility] She only receive a letter stating that Dr. Ancil Boozer is to complete a letter for handicap decal renewal.  Patient has thrown away the letter. At this time, Patient is requesting a call back when the letter is completed. Patient will come and pick up the letter. Please advise CB- [phone]

## 2019-10-08 NOTE — Telephone Encounter (Signed)
Waiting on Dr. Ancil Boozer to sign will call her soon as it is ready

## 2019-10-08 NOTE — Telephone Encounter (Signed)
Patient called to check the status of her request for a letter for her handicap sticker for her vehicle.  She states she has not heard back from the doctor yet.  Please advise and call patient back at [phone]

## 2019-10-09 NOTE — Telephone Encounter (Signed)
Pt calling back to check status of disability forms.

## 2019-10-10 NOTE — Telephone Encounter (Signed)
Form will be signed and completed today. WIll call patient at that time

## 2019-10-11 ENCOUNTER — Ambulatory Visit: Payer: Medicare Other | Attending: Internal Medicine

## 2019-10-11 DIAGNOSIS — Z23 Encounter for immunization: Secondary | ICD-10-CM

## 2019-10-11 NOTE — Progress Notes (Signed)
   Covid-19 Vaccination Clinic  Name:  Wendy Johnson    MRN: [id number] DOB: 08/06/1965  10/11/2019  [name] was observed post Covid-19 immunization for 15 minutes without incident. She was provided with Vaccine Information Sheet and instruction to access the V-Safe system.   [name] was instructed to call 911 with any severe reactions post vaccine: Marland Kitchen Difficulty breathing  . Swelling of face and throat  . A fast heartbeat  . A bad rash all over body  . Dizziness and weakness   Immunizations Administered    Name Date Dose VIS Date Route   Pfizer COVID-19 Vaccine 10/11/2019  8:12 AM 0.3 mL 07/03/2018 Intramuscular   Manufacturer: [place]   Lot: [id number]   [place]: [id number]

## 2019-10-14 DIAGNOSIS — J449 Chronic obstructive pulmonary disease, unspecified: Secondary | ICD-10-CM | POA: Diagnosis not present

## 2019-10-22 ENCOUNTER — Telehealth: Payer: Self-pay | Admitting: Family Medicine

## 2019-10-22 NOTE — Telephone Encounter (Signed)
Left message for patient to call back and schedule Medicare Annual Wellness Visit (AWV) either virtually or in office.  Last AWV 07/10/8218 ; please schedule at anytime with [facility] Health Advisor.

## 2019-10-23 ENCOUNTER — Telehealth: Payer: Self-pay | Admitting: Family Medicine

## 2019-10-23 ENCOUNTER — Other Ambulatory Visit: Payer: Self-pay

## 2019-10-23 MED ORDER — NICOTINE 21 MG/24HR TD PT24: 21.0000 mg | MEDICATED_PATCH | Freq: Every day | TRANSDERMAL | 0 refills | Status: DC

## 2019-10-23 NOTE — Telephone Encounter (Signed)
Per initial  Request:" Patient requesting nicotine patches, informed please allow 48 to 72 hour turn around time. Patient states she would like a follow up call when completed."   [place], [place] Phone:  [phone]  Fax:  [phone]     Will route to office for final disposition.  Requested medication (s) are due for refill today: Yes  Requested medication (s) are on the active medication list: No  Last refill: ?  Future visit scheduled: No  Notes to clinic: Not on medication list

## 2019-10-23 NOTE — Telephone Encounter (Signed)
Patient requesting nicotine patches, informed please allow 48 to 72 hour turn around time. Patient states she would like a follow up call when completed.   [place], [place] Phone:  [phone]  Fax:  [phone]

## 2019-10-25 ENCOUNTER — Other Ambulatory Visit: Payer: Self-pay

## 2019-10-25 ENCOUNTER — Telehealth (INDEPENDENT_AMBULATORY_CARE_PROVIDER_SITE_OTHER): Payer: Medicare Other | Admitting: Family Medicine

## 2019-10-25 ENCOUNTER — Telehealth: Payer: Self-pay

## 2019-10-25 DIAGNOSIS — Z5329 Procedure and treatment not carried out because of patient's decision for other reasons: Secondary | ICD-10-CM

## 2019-10-25 DIAGNOSIS — Z91199 Patient's noncompliance with other medical treatment and regimen due to unspecified reason: Secondary | ICD-10-CM

## 2019-10-25 NOTE — Progress Notes (Signed)
Did not answer the phone with multiple attempts by CMAs from 9:30  Pt called at about 10:05 - no show for appt, asked her to reschedule or go to UC for eval if needed

## 2019-10-25 NOTE — Telephone Encounter (Signed)
Third attempt to make contact with the patient to triage for virtual visit at [place], no answer. Patient will need to r/s.

## 2019-10-25 NOTE — Telephone Encounter (Signed)
Called patient, no answer, left vm to return call for triage for 0940 virtual appointment.

## 2019-10-28 ENCOUNTER — Other Ambulatory Visit: Payer: Self-pay

## 2019-10-28 ENCOUNTER — Encounter: Payer: Self-pay | Admitting: Surgery

## 2019-10-28 ENCOUNTER — Ambulatory Visit (INDEPENDENT_AMBULATORY_CARE_PROVIDER_SITE_OTHER): Payer: Medicare Other | Admitting: Surgery

## 2019-10-28 VITALS — BP 180/115 | HR 70 | Temp 97.2°F | Ht 64.0 in | Wt 249.4 lb

## 2019-10-28 DIAGNOSIS — R1084 Generalized abdominal pain: Secondary | ICD-10-CM | POA: Diagnosis not present

## 2019-10-28 DIAGNOSIS — K432 Incisional hernia without obstruction or gangrene: Secondary | ICD-10-CM

## 2019-10-28 DIAGNOSIS — J449 Chronic obstructive pulmonary disease, unspecified: Secondary | ICD-10-CM | POA: Diagnosis not present

## 2019-10-28 NOTE — Patient Instructions (Addendum)
We will set you up for a CT scan of the abdomen to assess the area of your pain.  We will have you follow up here in a few weeks to reassess.   Patient has been scheduled for a CT abdomen/pelvis with/ contrast at [facility] on 11/07/19 at 8:00 am. Please arrive there by 7:45 am. You will have nothing to eat or drink for 4 hours prior. you will need to pick up a prep kit.

## 2019-10-29 ENCOUNTER — Encounter: Payer: Self-pay | Admitting: Surgery

## 2019-10-29 NOTE — Progress Notes (Signed)
Outpatient Surgical Follow Up  10/29/2019  Wendy Johnson is an 54 y.o. female.   Chief Complaint  Patient presents with  . Abdominal Pain    HPI: Is a 54 year old female with a history of chronic pain and she is very well-known to me I have performed 2 hernias last 1 done on last year that was subcostally.  She reports that over the last few weeks has intermittent abdominal pain in the left side that is sharp moderate intensity and worsening with Valsalva.  No fevers no chills.  No current imaging studies at this time. History of Chronic pain and Bipolar disorder. Still smoking.  CBC and CMP, hemoglobin 1 AC completely normal.  Past Medical History:  Diagnosis Date  . ADHD (attention deficit hyperactivity disorder)   . Anxiety   . AR (allergic rhinitis)   . Arthritis   . Benign hypertension    NO MEDS  . Bipolar disorder ([place])   . Chronic back pain   . Chronic constipation   . Chronic insomnia   . COPD (chronic obstructive pulmonary disease) ([place])   . Deaf    RIGHT EAR  . Decreased dorsalis pedis pulse   . Depression   . Diabetes mellitus without complication ([place])    history of DM but no longer has  . Dyslipidemia   . Fatty liver   . GERD (gastroesophageal reflux disease)   . Hepatomegaly   . High cholesterol   . Hot flashes   . Migraine with aura   . OCD (obsessive compulsive disorder)   . Pain in joint, shoulder region 12/19/2018  . Plantar warts   . Sciatica, right side 02/18/2019  . Severe obesity ([place])   . Shortness of breath dyspnea   . Sleep apnea    NO CPAP  . Tobacco use   . Trochanteric bursitis of right hip     Past Surgical History:  Procedure Laterality Date  . ABDOMINAL HYSTERECTOMY N/A 10/20/2014   Procedure: Total abdominial hysterectomy, bilateral salpingo-oophorectomy;  Surgeon: Brayton Mars, MD;  Location: [facility] ORS;  Service: Gynecology;  Laterality: N/A;  . BILATERAL SALPINGOOPHORECTOMY    . bone spurs removed Bilateral   . CARPAL  TUNNEL RELEASE Left 06/08/2017   Procedure: CARPAL TUNNEL RELEASE;  Surgeon: Hessie Knows, MD;  Location: [facility] ORS;  Service: Orthopedics;  Laterality: Left;  . CARPAL TUNNEL RELEASE Right 10/10/2017   Procedure: CARPAL TUNNEL RELEASE;  Surgeon: Hessie Knows, MD;  Location: [facility] ORS;  Service: Orthopedics;  Laterality: Right;  . COLONOSCOPY WITH PROPOFOL N/A 09/01/2016   Procedure: COLONOSCOPY WITH PROPOFOL;  Surgeon: Jonathon Bellows, MD;  Location: [facility] ENDOSCOPY;  Service: Endoscopy;  Laterality: N/A;  . COLONOSCOPY WITH PROPOFOL N/A 07/30/2019   Procedure: COLONOSCOPY WITH PROPOFOL;  Surgeon: Lucilla Lame, MD;  Location: [facility] ENDOSCOPY;  Service: Endoscopy;  Laterality: N/A;  . DORSAL COMPARTMENT RELEASE Left 06/08/2017   Procedure: RELEASE DORSAL COMPARTMENT (DEQUERVAIN);  Surgeon: Hessie Knows, MD;  Location: [facility] ORS;  Service: Orthopedics;  Laterality: Left;  . ESOPHAGOGASTRODUODENOSCOPY (EGD) WITH PROPOFOL N/A 09/01/2016   Procedure: ESOPHAGOGASTRODUODENOSCOPY (EGD) WITH PROPOFOL;  Surgeon: Jonathon Bellows, MD;  Location: [facility] ENDOSCOPY;  Service: Endoscopy;  Laterality: N/A;  . ESOPHAGOGASTRODUODENOSCOPY (EGD) WITH PROPOFOL N/A 07/30/2019   Procedure: ESOPHAGOGASTRODUODENOSCOPY (EGD) WITH PROPOFOL;  Surgeon: Lucilla Lame, MD;  Location: [facility] ENDOSCOPY;  Service: Endoscopy;  Laterality: N/A;  . FOOT SURGERY Bilateral   . INSERTION OF MESH N/A 12/26/2017   Procedure: INSERTION OF MESH;  Surgeon: Jules Husbands,  MD;  Location: [facility] ORS;  Service: General;  Laterality: N/A;  . JOINT REPLACEMENT Right    Total Knee replacement X 2  . JOINT REPLACEMENT Bilateral    Total Hip Replacement  . knee arthroscopo Right   . LAPAROSCOPY  09/22/2014   Procedure: LAPAROSCOPY OPERATIVE;  Surgeon: Brayton Mars, MD;  Location: [facility] ORS;  Service: Gynecology;;  excision and fulgeration of endomertriosis  . LIPOMA EXCISION    . ROBOTIC ASSISTED LAPAROSCOPIC VENTRAL/INCISIONAL HERNIA REPAIR N/A 12/26/2017    Procedure: ROBOTIC ASSISTED LAPAROSCOPIC VENTRAL/INCISIONAL HERNIA REPAIR;  Surgeon: Jules Husbands, MD;  Location: [facility] ORS;  Service: General;  Laterality: N/A;  . TONSILLECTOMY    . TOTAL HIP ARTHROPLASTY Left 12/16/2014   Procedure: TOTAL HIP ARTHROPLASTY ANTERIOR APPROACH;  Surgeon: Hessie Knows, MD;  Location: [facility] ORS;  Service: Orthopedics;  Laterality: Left;  . TOTAL HIP ARTHROPLASTY Right 05/12/2015   Procedure: TOTAL HIP ARTHROPLASTY ANTERIOR APPROACH;  Surgeon: Hessie Knows, MD;  Location: [facility] ORS;  Service: Orthopedics;  Laterality: Right;  . TOTAL KNEE REVISION Right 09/22/2015   Procedure: TOTAL KNEE REVISION/ REVISE POLYIETHYLENE;  Surgeon: Hessie Knows, MD;  Location: [facility] ORS;  Service: Orthopedics;  Laterality: Right;  . TOTAL KNEE REVISION Right 10/11/2016   Procedure: TOTAL KNEE REVISION;  Surgeon: Hessie Knows, MD;  Location: [facility] ORS;  Service: Orthopedics;  Laterality: Right;  . TUBAL LIGATION    . VENTRAL HERNIA REPAIR N/A 11/08/2018   Procedure: HERNIA REPAIR VENTRAL ADULT OPEN. DIABETIC, SLEEP APNEA;  Surgeon: Jules Husbands, MD;  Location: [facility] ORS;  Service: General;  Laterality: N/A;    Family History  Problem Relation Age of Onset  . Diabetes Mother   . Heart disease Father   . Heart attack Father   . Diabetes Sister   . Breast cancer Maternal Aunt        <50  . Breast cancer Paternal Aunt        x2.  <50  . Healthy Son   . Drug abuse Sister     Social History:  reports that she has been smoking cigarettes. She started smoking about 39 years ago. She has a 18.50 pack-year smoking history. She has never used smokeless tobacco. She reports that she does not drink alcohol and does not use drugs.  Allergies:  Allergies  Allergen Reactions  . Codeine Nausea Only  . Imitrex [Sumatriptan] Other (See Comments)    Headaches    Medications reviewed.    ROS Full ROS performed and is otherwise negative other than what is stated in HPI   BP (!) 180/115    Pulse 70   Temp (!) 97.2 F (36.2 C)   Ht 5\' 4"  (1.626 m)   Wt 249 lb 6.4 oz (113.1 kg)   LMP 09/26/2014 Comment: Total  SpO2 95%   BMI 42.81 kg/m   Physical Exam Vitals and nursing note reviewed. Exam conducted with a chaperone present.  Constitutional:      Appearance: She is well-developed. She is obese.  Pulmonary:     Effort: Pulmonary effort is normal. No respiratory distress.     Breath sounds: No stridor.  Abdominal:     General: There is no distension.     Palpations: Abdomen is soft. There is no mass.     Tenderness: There is no abdominal tenderness. There is no guarding.     Hernia: No hernia is present.     Comments: Tender to palpation left side, no definitive fistula, exam  limited due to body habitus  Musculoskeletal:     Cervical back: Normal range of motion and neck supple. No rigidity or tenderness.  Skin:    General: Skin is warm and dry.     Capillary Refill: Capillary refill takes less than 2 seconds.  Neurological:     Mental Status: She is alert.  Psychiatric:        Mood and Affect: Mood normal.        Behavior: Behavior normal.        Thought Content: Thought content normal.    Assessment/Plan: Abdominal pain in a patient with a history of chronic pain and bipolar disorder I do not feel an evident hernia however given her body habitus it is difficult to completely rule it out.  I will obtain a CT scan of the abdomen and pelvis to evaluate the anatomy of the domino wall and see her back in a couple weeks.  There is no need for emergent surgical intervention.    Greater than 50% of the 25 minutes  visit was spent in counseling/coordination of care   Caroleen Hamman, MD [facility]

## 2019-10-31 ENCOUNTER — Ambulatory Visit: Payer: Medicare Other

## 2019-11-04 ENCOUNTER — Ambulatory Visit: Payer: Medicare Other

## 2019-11-07 ENCOUNTER — Ambulatory Visit: Admission: RE | Admit: 2019-11-07 | Payer: Medicare Other | Source: Ambulatory Visit

## 2019-11-12 ENCOUNTER — Other Ambulatory Visit: Payer: Self-pay

## 2019-11-12 ENCOUNTER — Encounter: Payer: Self-pay | Admitting: Anesthesiology

## 2019-11-12 ENCOUNTER — Ambulatory Visit
Admission: RE | Admit: 2019-11-12 | Discharge: 2019-11-12 | Disposition: A | Discharge status: Home / Self Care | Payer: Medicare Other | Source: Ambulatory Visit | Attending: Anesthesiology | Admitting: Anesthesiology

## 2019-11-12 ENCOUNTER — Ambulatory Visit (HOSPITAL_BASED_OUTPATIENT_CLINIC_OR_DEPARTMENT_OTHER): Payer: Medicare Other | Admitting: Anesthesiology

## 2019-11-12 ENCOUNTER — Other Ambulatory Visit: Payer: Self-pay | Admitting: Anesthesiology

## 2019-11-12 ENCOUNTER — Telehealth: Payer: Self-pay

## 2019-11-12 VITALS — BP 151/111 | HR 98 | Resp 16 | Ht 64.0 in | Wt 245.0 lb

## 2019-11-12 DIAGNOSIS — M542 Cervicalgia: Secondary | ICD-10-CM | POA: Insufficient documentation

## 2019-11-12 DIAGNOSIS — G894 Chronic pain syndrome: Secondary | ICD-10-CM

## 2019-11-12 DIAGNOSIS — M545 Low back pain, unspecified: Secondary | ICD-10-CM

## 2019-11-12 DIAGNOSIS — M25551 Pain in right hip: Secondary | ICD-10-CM | POA: Diagnosis not present

## 2019-11-12 DIAGNOSIS — R52 Pain, unspecified: Secondary | ICD-10-CM | POA: Diagnosis not present

## 2019-11-12 DIAGNOSIS — M25552 Pain in left hip: Secondary | ICD-10-CM

## 2019-11-12 DIAGNOSIS — M47816 Spondylosis without myelopathy or radiculopathy, lumbar region: Secondary | ICD-10-CM | POA: Diagnosis not present

## 2019-11-12 DIAGNOSIS — F1190 Opioid use, unspecified, uncomplicated: Secondary | ICD-10-CM

## 2019-11-12 DIAGNOSIS — M5136 Other intervertebral disc degeneration, lumbar region: Secondary | ICD-10-CM | POA: Insufficient documentation

## 2019-11-12 DIAGNOSIS — M5431 Sciatica, right side: Secondary | ICD-10-CM | POA: Diagnosis not present

## 2019-11-12 DIAGNOSIS — M5432 Sciatica, left side: Secondary | ICD-10-CM | POA: Diagnosis not present

## 2019-11-12 DIAGNOSIS — M2550 Pain in unspecified joint: Secondary | ICD-10-CM

## 2019-11-12 DIAGNOSIS — M51369 Other intervertebral disc degeneration, lumbar region without mention of lumbar back pain or lower extremity pain: Secondary | ICD-10-CM

## 2019-11-12 MED ORDER — OXYCODONE HCL 10 MG PO TABS: 10.0000 mg | ORAL_TABLET | Freq: Four times a day (QID) | ORAL | 0 refills | Status: AC

## 2019-11-12 MED ORDER — ROPIVACAINE HCL 2 MG/ML IJ SOLN
10.0000 mL | Freq: Once | INTRAMUSCULAR | Status: AC
  Administered 2019-11-12: 10 mL via EPIDURAL

## 2019-11-12 MED ORDER — ROPIVACAINE HCL 2 MG/ML IJ SOLN
INTRAMUSCULAR | Status: AC
  Filled 2019-11-12: qty 10

## 2019-11-12 MED ORDER — SODIUM CHLORIDE 0.9% FLUSH
10.0000 mL | Freq: Once | INTRAVENOUS | Status: AC
  Administered 2019-11-12: 10 mL

## 2019-11-12 MED ORDER — TRIAMCINOLONE ACETONIDE 40 MG/ML IJ SUSP
INTRAMUSCULAR | Status: AC
  Filled 2019-11-12: qty 1

## 2019-11-12 MED ORDER — TRIAMCINOLONE ACETONIDE 40 MG/ML IJ SUSP
40.0000 mg | Freq: Once | INTRAMUSCULAR | Status: AC
  Administered 2019-11-12: 40 mg

## 2019-11-12 MED ORDER — IOHEXOL 180 MG/ML  SOLN
10.0000 mL | Freq: Once | INTRAMUSCULAR | Status: AC | PRN
  Administered 2019-11-12: 10 mL via EPIDURAL

## 2019-11-12 MED ORDER — LIDOCAINE HCL (PF) 1 % IJ SOLN
5.0000 mL | Freq: Once | INTRAMUSCULAR | Status: AC
  Administered 2019-11-12: 5 mL via SUBCUTANEOUS

## 2019-11-12 MED ORDER — SODIUM CHLORIDE (PF) 0.9 % IJ SOLN
INTRAMUSCULAR | Status: AC
  Filled 2019-11-12: qty 10

## 2019-11-12 MED ORDER — LIDOCAINE HCL (PF) 1 % IJ SOLN
INTRAMUSCULAR | Status: AC
  Filled 2019-11-12: qty 10

## 2019-11-12 NOTE — Progress Notes (Signed)
Nursing Pain Medication Assessment:  Safety precautions to be maintained throughout the outpatient stay will include: orient to surroundings, keep bed in low position, maintain call bell within reach at all times, provide assistance with transfer out of bed and ambulation.  Medication Inspection Compliance: Pill count conducted under aseptic conditions, in front of the patient. Neither the pills nor the bottle was removed from the patient's sight at any time. Once count was completed pills were immediately returned to the patient in their original bottle.  Medication: Oxycodone IR Pill/Patch Count: 6 of 120 pills remain Pill/Patch Appearance: Markings consistent with prescribed medication Bottle Appearance: Standard pharmacy container. Clearly labeled. Filled Date  10-15-2019 Last Medication intake:  Today

## 2019-11-12 NOTE — Progress Notes (Signed)
Subjective:  Patient ID: Wendy Johnson, female    DOB: 1966-02-19  Age: 54 y.o. MRN: [id number]  CC: Back Pain (low)   Procedure: L5-S1 epidural steroid under fluoroscopic guidance with no sedation  HPI Wendy Johnson presents for reevaluation.  She was last seen a few months ago and presents today for a repeat epidural.  Her last epidural was in February and she generally gets 6 weeks of significant improvement in her low back pain and bilateral lower extremity pain.  Then she gets recurrence of the same quality characteristic and distribution of pain that she has reported for the last 5 to 10 years.  Unfortunately she has failed conservative therapy with physical therapy and efforts at weight loss in addition to more conservative medication management.  She is taking her medications as prescribed oxycodone 10 mg 4 times a day and this continues to give her good relief without any side effects.  The quality characteristic and distribution of her low back pain to been stable without any significant change reported.  Strength and sensation have been stable as well with no change in bowel or bladder function.  She is still having troubles with her knees and hips and is followed by orthopedics for this.  Otherwise she is in her usual state of health at this point.  Outpatient Medications Prior to Visit  Medication Sig Dispense Refill  . albuterol (PROVENTIL) (2.5 MG/3ML) 0.083% nebulizer solution Take 3 mLs (2.5 mg total) by nebulization every 6 (six) hours as needed for wheezing or shortness of breath. 75 mL 1  . amphetamine-dextroamphetamine (ADDERALL) 15 MG tablet Take 15 mg by mouth daily.     Marland Kitchen amphetamine-dextroamphetamine (ADDERALL) 30 MG tablet Take 30 mg by mouth 2 (two) times daily.    Marland Kitchen atorvastatin (LIPITOR) 40 MG tablet Take 1 tablet (40 mg total) by mouth daily. 90 tablet 1  . busPIRone (BUSPAR) 30 MG tablet Take 30 mg by mouth 2 (two) times daily.     . clonazePAM (KLONOPIN) 1 MG  tablet Take 1 mg by mouth 6 (six) times daily.     Marland Kitchen DEXILANT 60 MG capsule TAKE 1 CAPSULE BY MOUTH DAILY 90 capsule 1  . gabapentin (NEURONTIN) 300 MG capsule Take by mouth.    . hydrALAZINE (APRESOLINE) 10 MG tablet Take 1 tablet (10 mg total) by mouth 3 (three) times daily. Prn bp above 140/90 90 tablet 0  . hydrOXYzine (ATARAX/VISTARIL) 50 MG tablet Take by mouth.    Marland Kitchen ibuprofen (ADVIL) 800 MG tablet TAKE 1 TABLET BY MOUTH EVERY 8 HOURS AS NEEDED. 90 tablet 0  . LATUDA 60 MG TABS Take 1 tablet by mouth at bedtime.    . nicotine (NICODERM CQ - DOSED IN MG/24 HOURS) 21 mg/24hr patch Place 1 patch (21 mg total) onto the skin daily. 28 patch 0  . omega-3 acid ethyl esters (LOVAZA) 1 g capsule TAKE TWO CAPSULES BY MOUTH TWICE A DAY 360 capsule 0  . PROAIR HFA 108 (90 Base) MCG/ACT inhaler Inhale 2 puffs into the lungs every 4 (four) hours as needed for wheezing or shortness of breath. 8.5 g 0  . sertraline (ZOLOFT) 100 MG tablet Take 100 mg by mouth 2 (two) times daily.    Marland Kitchen SPIRIVA HANDIHALER 18 MCG inhalation capsule PLACE 1 CAPSULE INTO INHALER AND INHALE DAILY 30 capsule 5  . traZODone (DESYREL) 100 MG tablet Take 400 mg by mouth at bedtime.     . Oxycodone HCl 10 MG  TABS Take 1 tablet (10 mg total) by mouth 4 (four) times daily. 120 tablet 0  . cyclobenzaprine (FLEXERIL) 10 MG tablet Take 10 mg by mouth 3 (three) times daily.    Marland Kitchen LATUDA 40 MG TABS tablet Take 40 mg by mouth daily with breakfast.  (Patient not taking: Reported on 11/12/2019)     No facility-administered medications prior to visit.    Review of Systems CNS: No confusion or sedation Cardiac: No angina or palpitations GI: No abdominal pain or constipation Constitutional: No nausea vomiting fevers or chills  Objective:  BP (!) 151/111   Pulse 98   Resp 16   Ht 5\' 4"  (1.626 m)   Wt 245 lb (111.1 kg)   LMP 09/26/2014 Comment: Total  SpO2 94%   BMI 42.05 kg/m    BP Readings from Last 3 Encounters:  11/12/19 (!)  151/111  10/28/19 (!) 180/115  07/30/19 (!) 131/93     Wt Readings from Last 3 Encounters:  11/12/19 245 lb (111.1 kg)  10/28/19 249 lb 6.4 oz (113.1 kg)  07/30/19 250 lb (113.4 kg)     Physical Exam Pt is alert and oriented PERRL EOMI HEART IS RRR no murmur or rub LCTA no wheezing or rales MUSCULOSKELETAL reveals some paraspinous muscle tenderness but no overt trigger points.  She walks with an antalgic gait and her muscle tone and bulk is at baseline.  She has no trigger points in her lower back and she does have a positive straight leg raise on the left side.  Somewhat equivocal on the right.  Labs  Lab Results  Component Value Date   HGBA1C 5.3 06/04/2019   HGBA1C 5.3 10/04/2018   HGBA1C 5.5 01/22/2018   Lab Results  Component Value Date   MICROALBUR 0.2 06/04/2019   LDLCALC 106 (H) 06/04/2019   CREATININE 0.65 06/04/2019    -------------------------------------------------------------------------------------------------------------------- Lab Results  Component Value Date   WBC 10.6 06/04/2019   HGB 15.3 06/04/2019   HCT 46.9 (H) 06/04/2019   PLT 291 06/04/2019   GLUCOSE 92 06/04/2019   CHOL 175 06/04/2019   TRIG 194 (H) 06/04/2019   HDL 38 (L) 06/04/2019   LDLCALC 106 (H) 06/04/2019   ALT 11 06/04/2019   AST 10 06/04/2019   NA 139 06/04/2019   K 4.4 06/04/2019   CL 104 06/04/2019   CREATININE 0.65 06/04/2019   BUN 8 06/04/2019   CO2 26 06/04/2019   TSH 0.769 02/03/2016   INR 0.97 10/04/2016   HGBA1C 5.3 06/04/2019   MICROALBUR 0.2 06/04/2019    --------------------------------------------------------------------------------------------------------------------- DG PAIN CLINIC C-ARM 1-60 MIN NO REPORT  Result Date: 11/12/2019 Fluoro was used, but no Radiologist interpretation will be provided. Please refer to "NOTES" tab for provider progress note.    Assessment & Plan:   Wendy Johnson was seen today for back pain.  Diagnoses and all orders for this  visit:  DDD (degenerative disc disease), lumbar  Low back pain at multiple sites  Chronic, continuous use of opioids -     ToxASSURE Select 13 (MW), Urine  Chronic pain syndrome -     ToxASSURE Select 13 (MW), Urine  Sciatica, right side  Sciatica of left side  Pain of both hip joints  Cervicalgia  Facet arthritis of lumbar region  Pain of right hip joint  Other orders -     sodium chloride flush (NS) 0.9 % injection 10 mL -     triamcinolone acetonide (KENALOG-40) injection 40 mg -  ropivacaine (PF) 2 mg/mL (0.2%) (NAROPIN) injection 10 mL -     lidocaine (PF) (XYLOCAINE) 1 % injection 5 mL -     iohexol (OMNIPAQUE) 180 MG/ML injection 10 mL -     Oxycodone HCl 10 MG TABS; Take 1 tablet (10 mg total) by mouth 4 (four) times daily. -     Oxycodone HCl 10 MG TABS; Take 1 tablet (10 mg total) by mouth 4 (four) times daily.        ----------------------------------------------------------------------------------------------------------------------  Problem List Items Addressed This Visit      Unprioritized   DDD (degenerative disc disease), lumbar - Primary   Relevant Medications   cyclobenzaprine (FLEXERIL) 10 MG tablet   triamcinolone acetonide (KENALOG-40) injection 40 mg (Start on 11/12/2019  2:30 PM)   Oxycodone HCl 10 MG TABS (Start on 11/14/2019)   Oxycodone HCl 10 MG TABS (Start on 12/14/2019)   Facet arthritis of lumbar region   Relevant Medications   cyclobenzaprine (FLEXERIL) 10 MG tablet   triamcinolone acetonide (KENALOG-40) injection 40 mg (Start on 11/12/2019  2:30 PM)   Oxycodone HCl 10 MG TABS (Start on 11/14/2019)   Oxycodone HCl 10 MG TABS (Start on 12/14/2019)   Sciatica, right side   Relevant Medications   cyclobenzaprine (FLEXERIL) 10 MG tablet   hydrOXYzine (ATARAX/VISTARIL) 50 MG tablet   LATUDA 60 MG TABS    Other Visit Diagnoses    Low back pain at multiple sites       Relevant Medications   cyclobenzaprine (FLEXERIL) 10 MG tablet    triamcinolone acetonide (KENALOG-40) injection 40 mg (Start on 11/12/2019  2:30 PM)   Oxycodone HCl 10 MG TABS (Start on 11/14/2019)   Oxycodone HCl 10 MG TABS (Start on 12/14/2019)   Chronic, continuous use of opioids       Relevant Orders   ToxASSURE Select 13 (MW), Urine   Chronic pain syndrome       Relevant Medications   cyclobenzaprine (FLEXERIL) 10 MG tablet   triamcinolone acetonide (KENALOG-40) injection 40 mg (Start on 11/12/2019  2:30 PM)   ropivacaine (PF) 2 mg/mL (0.2%) (NAROPIN) injection 10 mL (Start on 11/12/2019  2:30 PM)   lidocaine (PF) (XYLOCAINE) 1 % injection 5 mL (Start on 11/12/2019  2:30 PM)   Oxycodone HCl 10 MG TABS (Start on 11/14/2019)   Oxycodone HCl 10 MG TABS (Start on 12/14/2019)   Other Relevant Orders   ToxASSURE Select 13 (MW), Urine   Sciatica of left side       Relevant Medications   cyclobenzaprine (FLEXERIL) 10 MG tablet   hydrOXYzine (ATARAX/VISTARIL) 50 MG tablet   LATUDA 60 MG TABS   Pain of both hip joints       Cervicalgia       Pain of right hip joint            ----------------------------------------------------------------------------------------------------------------------  1. DDD (degenerative disc disease), lumbar As per discussion with the patient we will proceed with a repeat epidural per her request today.  We gone over the risks and benefits of the procedure with her in full detail.  She is asking to proceed at every other month epidural steroid administration secondary to the severity and chronicity of her back pain.  Unfortunately she has failed more conservative therapy.  She has tried efforts at weight loss with limited success and more conservative therapy with no success.  She understands the risks and benefits of repeat epidural steroid administration as discussed in full detail.  2. Low  back pain at multiple sites Continue with core stretching strengthening exercises as tolerated.  3. Chronic, continuous use of opioids We have  gone over the risks and benefits of chronic opioid management and this continues to work well for her with no side effects.  I have reviewed the [facility] practitioner database information and it is appropriate.  Refills will be given for July 8 and August 7. - ToxASSURE Select 13 (MW), Urine  4. Chronic pain syndrome As above - ToxASSURE Select 13 (MW), Urine  5. Sciatica, right side As above and continue core stretching strengthening  6. Sciatica of left side As above  7. Pain of both hip joints   8. Cervicalgia   9. Facet arthritis of lumbar region   10. Pain of right hip joint Continue follow-up with orthopedics    ----------------------------------------------------------------------------------------------------------------------  I am having Nobie Putnam. Odor "Wendy Johnson" start on Oxycodone HCl. I am also having her maintain her amphetamine-dextroamphetamine, busPIRone, sertraline, clonazePAM, traZODone, albuterol, Latuda, amphetamine-dextroamphetamine, ProAir HFA, omega-3 acid ethyl esters, Spiriva HandiHaler, atorvastatin, hydrALAZINE, ibuprofen, gabapentin, Dexilant, nicotine, cyclobenzaprine, hydrOXYzine, Latuda, and Oxycodone HCl.   Meds ordered this encounter  Medications  . sodium chloride flush (NS) 0.9 % injection 10 mL  . triamcinolone acetonide (KENALOG-40) injection 40 mg  . ropivacaine (PF) 2 mg/mL (0.2%) (NAROPIN) injection 10 mL  . lidocaine (PF) (XYLOCAINE) 1 % injection 5 mL  . iohexol (OMNIPAQUE) 180 MG/ML injection 10 mL  . Oxycodone HCl 10 MG TABS    Sig: Take 1 tablet (10 mg total) by mouth 4 (four) times daily.    Dispense:  120 tablet    Refill:  0  . Oxycodone HCl 10 MG TABS    Sig: Take 1 tablet (10 mg total) by mouth 4 (four) times daily.    Dispense:  120 tablet    Refill:  0   Patient's Medications  New Prescriptions   OXYCODONE HCL 10 MG TABS    Take 1 tablet (10 mg total) by mouth 4 (four) times daily.  Previous Medications    ALBUTEROL (PROVENTIL) (2.5 MG/3ML) 0.083% NEBULIZER SOLUTION    Take 3 mLs (2.5 mg total) by nebulization every 6 (six) hours as needed for wheezing or shortness of breath.   AMPHETAMINE-DEXTROAMPHETAMINE (ADDERALL) 15 MG TABLET    Take 15 mg by mouth daily.    AMPHETAMINE-DEXTROAMPHETAMINE (ADDERALL) 30 MG TABLET    Take 30 mg by mouth 2 (two) times daily.   ATORVASTATIN (LIPITOR) 40 MG TABLET    Take 1 tablet (40 mg total) by mouth daily.   BUSPIRONE (BUSPAR) 30 MG TABLET    Take 30 mg by mouth 2 (two) times daily.    CLONAZEPAM (KLONOPIN) 1 MG TABLET    Take 1 mg by mouth 6 (six) times daily.    CYCLOBENZAPRINE (FLEXERIL) 10 MG TABLET    Take 10 mg by mouth 3 (three) times daily.   DEXILANT 60 MG CAPSULE    TAKE 1 CAPSULE BY MOUTH DAILY   GABAPENTIN (NEURONTIN) 300 MG CAPSULE    Take by mouth.   HYDRALAZINE (APRESOLINE) 10 MG TABLET    Take 1 tablet (10 mg total) by mouth 3 (three) times daily. Prn bp above 140/90   HYDROXYZINE (ATARAX/VISTARIL) 50 MG TABLET    Take by mouth.   IBUPROFEN (ADVIL) 800 MG TABLET    TAKE 1 TABLET BY MOUTH EVERY 8 HOURS AS NEEDED.   LATUDA 40 MG TABS TABLET    Take 40 mg by  mouth daily with breakfast.    LATUDA 60 MG TABS    Take 1 tablet by mouth at bedtime.   NICOTINE (NICODERM CQ - DOSED IN MG/24 HOURS) 21 MG/24HR PATCH    Place 1 patch (21 mg total) onto the skin daily.   OMEGA-3 ACID ETHYL ESTERS (LOVAZA) 1 G CAPSULE    TAKE TWO CAPSULES BY MOUTH TWICE A DAY   PROAIR HFA 108 (90 BASE) MCG/ACT INHALER    Inhale 2 puffs into the lungs every 4 (four) hours as needed for wheezing or shortness of breath.   SERTRALINE (ZOLOFT) 100 MG TABLET    Take 100 mg by mouth 2 (two) times daily.   SPIRIVA HANDIHALER 18 MCG INHALATION CAPSULE    PLACE 1 CAPSULE INTO INHALER AND INHALE DAILY   TRAZODONE (DESYREL) 100 MG TABLET    Take 400 mg by mouth at bedtime.   Modified Medications   Modified Medication Previous Medication   OXYCODONE HCL 10 MG TABS Oxycodone HCl 10 MG TABS       Take 1 tablet (10 mg total) by mouth 4 (four) times daily.    Take 1 tablet (10 mg total) by mouth 4 (four) times daily.  Discontinued Medications   No medications on file   ----------------------------------------------------------------------------------------------------------------------  Follow-up: No follow-ups on file.   Procedure: L5-S1 LESI with fluoroscopic guidance and no moderate sedation  NOTE: The risks, benefits, and expectations of the procedure have been discussed and explained to the patient who was understanding and in agreement with suggested treatment plan. No guarantees were made.  DESCRIPTION OF PROCEDURE: Lumbar epidural steroid injection with no IV Versed, EKG, blood pressure, pulse, and pulse oximetry monitoring. The procedure was performed with the patient in the prone position under fluoroscopic guidance.  Sterile prep x3 was initiated and I then injected subcutaneous lidocaine to the overlying L5-S1 site after its fluoroscopic identifictation.  Using strict aseptic technique, I then advanced an 18-gauge Tuohy epidural needle in the midline using interlaminar approach via loss-of-resistance to saline technique. There was negative aspiration for heme or  CSF.  I then confirmed position with both AP and Lateral fluoroscan.  2 cc of contrast dye were injected and a  total of 5 mL of Preservative-Free normal saline mixed with 40 mg of Kenalog and 1cc Ropicaine 0.2 percent were injected incrementally via the  epidurally placed needle. The needle was removed. The patient tolerated the injection well and was convalesced and discharged to home in stable condition. Should the patient have any post procedure difficulty they have been instructed on how to contact us for assistance.    Molli Barrows, MD

## 2019-11-12 NOTE — Patient Instructions (Signed)
Pain Management Discharge Instructions  General Discharge Instructions :  If you need to reach your doctor call: Monday-Friday 8:00 am - 4:00 pm at [phone] or toll free [phone].  After clinic hours [phone] to have operator reach doctor.  Bring all of your medication bottles to all your appointments in the pain clinic.  To cancel or reschedule your appointment with Pain Management please remember to call 24 hours in advance to avoid a fee.  Refer to the educational materials which you have been given on: General Risks, I had my Procedure. Discharge Instructions, Post Sedation.  Post Procedure Instructions:  The drugs you were given will stay in your system until tomorrow, so for the next 24 hours you should not drive, make any legal decisions or drink any alcoholic beverages.  You may eat anything you prefer, but it is better to start with liquids then soups and crackers, and gradually work up to solid foods.  Please notify your doctor immediately if you have any unusual bleeding, trouble breathing or pain that is not related to your normal pain.  Depending on the type of procedure that was done, some parts of your body may feel week and/or numb.  This usually clears up by tonight or the next day.  Walk with the use of an assistive device or accompanied by an adult for the 24 hours.  You may use ice on the affected area for the first 24 hours.  Put ice in a Ziploc bag and cover with a towel and place against area 15 minutes on 15 minutes off.  You may switch to heat after 24 hours.Epidural Steroid Injection Patient Information  Description: The epidural space surrounds the nerves as they exit the spinal cord.  In some patients, the nerves can be compressed and inflamed by a bulging disc or a tight spinal canal (spinal stenosis).  By injecting steroids into the epidural space, we can bring irritated nerves into direct contact with a potentially helpful medication.  These  steroids act directly on the irritated nerves and can reduce swelling and inflammation which often leads to decreased pain.  Epidural steroids may be injected anywhere along the spine and from the neck to the low back depending upon the location of your pain.   After numbing the skin with local anesthetic (like Novocaine), a small needle is passed into the epidural space slowly.  You may experience a sensation of pressure while this is being done.  The entire block usually last less than 10 minutes.  Conditions which may be treated by epidural steroids:   Low back and leg pain  Neck and arm pain  Spinal stenosis  Post-laminectomy syndrome  Herpes zoster (shingles) pain  Pain from compression fractures  Preparation for the injection:  1. Do not eat any solid food or dairy products within 8 hours of your appointment.  2. You may drink clear liquids up to 3 hours before appointment.  Clear liquids include water, black coffee, juice or soda.  No milk or cream please. 3. You may take your regular medication, including pain medications, with a sip of water before your appointment  Diabetics should hold regular insulin (if taken separately) and take 1/2 normal NPH dos the morning of the procedure.  Carry some sugar containing items with you to your appointment. 4. A driver must accompany you and be prepared to drive you home after your procedure.  5. Bring all your current medications with your. 6. An IV may be inserted and   sedation may be given at the discretion of the physician.   7. A blood pressure cuff, EKG and other monitors will often be applied during the procedure.  Some patients may need to have extra oxygen administered for a short period. 8. You will be asked to provide medical information, including your allergies, prior to the procedure.  We must know immediately if you are taking blood thinners (like Coumadin/Warfarin)  Or if you are allergic to IV iodine contrast (dye). We must  know if you could possible be pregnant.  Possible side-effects:  Bleeding from needle site  Infection (rare, may require surgery)  Nerve injury (rare)  Numbness & tingling (temporary)  Difficulty urinating (rare, temporary)  Spinal headache ( a headache worse with upright posture)  Light -headedness (temporary)  Pain at injection site (several days)  Decreased blood pressure (temporary)  Weakness in arm/leg (temporary)  Pressure sensation in back/neck (temporary)  Call if you experience:  Fever/chills associated with headache or increased back/neck pain.  Headache worsened by an upright position.  New onset weakness or numbness of an extremity below the injection site  Hives or difficulty breathing (go to the emergency room)  Inflammation or drainage at the infection site  Severe back/neck pain  Any new symptoms which are concerning to you  Please note:  Although the local anesthetic injected can often make your back or neck feel good for several hours after the injection, the pain will likely return.  It takes 3-7 days for steroids to work in the epidural space.  You may not notice any pain relief for at least that one week.  If effective, we will often do a series of three injections spaced 3-6 weeks apart to maximally decrease your pain.  After the initial series, we generally will wait several months before considering a repeat injection of the same type.  If you have any questions, please call [phone] [facility] 

## 2019-11-12 NOTE — Addendum Note (Signed)
Addended by: Molli Barrows on: 11/12/2019 02:24 PM   Modules accepted: Orders

## 2019-11-13 ENCOUNTER — Other Ambulatory Visit: Payer: Self-pay

## 2019-11-13 ENCOUNTER — Ambulatory Visit: Payer: Medicare Other | Admitting: Surgery

## 2019-11-13 ENCOUNTER — Ambulatory Visit
Admission: RE | Admit: 2019-11-13 | Discharge: 2019-11-13 | Disposition: A | Discharge status: Home / Self Care | Payer: Medicare Other | Source: Ambulatory Visit | Attending: Surgery | Admitting: Surgery

## 2019-11-13 ENCOUNTER — Telehealth: Payer: Self-pay | Admitting: *Deleted

## 2019-11-13 DIAGNOSIS — K432 Incisional hernia without obstruction or gangrene: Secondary | ICD-10-CM | POA: Diagnosis not present

## 2019-11-13 DIAGNOSIS — R1084 Generalized abdominal pain: Secondary | ICD-10-CM | POA: Insufficient documentation

## 2019-11-13 DIAGNOSIS — K439 Ventral hernia without obstruction or gangrene: Secondary | ICD-10-CM | POA: Diagnosis not present

## 2019-11-13 DIAGNOSIS — K6389 Other specified diseases of intestine: Secondary | ICD-10-CM | POA: Diagnosis not present

## 2019-11-13 DIAGNOSIS — M2550 Pain in unspecified joint: Secondary | ICD-10-CM

## 2019-11-13 DIAGNOSIS — I700 Atherosclerosis of aorta: Secondary | ICD-10-CM | POA: Diagnosis not present

## 2019-11-13 LAB — POCT I-STAT CREATININE: Creatinine, Ser: 0.5 mg/dL (ref 0.44–1.00)

## 2019-11-13 MED ORDER — IOHEXOL 300 MG/ML  SOLN
100.0000 mL | Freq: Once | INTRAMUSCULAR | Status: AC | PRN
  Administered 2019-11-13: 100 mL via INTRAVENOUS

## 2019-11-13 NOTE — Telephone Encounter (Signed)
Spoke with pt and she stated that she will schedule appt at a later time. Stated that she has a lot of other appointments going on right now.

## 2019-11-13 NOTE — Telephone Encounter (Signed)
Attempted to call for post procedure follow-up. Message left. 

## 2019-11-14 ENCOUNTER — Telehealth: Payer: Self-pay

## 2019-11-14 NOTE — Telephone Encounter (Signed)
-----   Message from Jules Husbands, MD sent at 11/14/2019  1:54 PM EDT ----- Please let her know she does not have a hernia ----- Message ----- From: Interface, Rad Results In Sent: 11/14/2019  12:12 PM EDT To: Jules Husbands, MD

## 2019-11-14 NOTE — Telephone Encounter (Signed)
Spoke with patient to notify her of recent CT results per Dr.Pabon, patient does not have a hernia. Patient asked if she needed to keep scheduled appointment for Monday at 3:00p. Patient was advised to keep scheduled appointment, in order to discuss the CT in depth and answer any questions she may have regarding imaging, but also to discuss the next recommended steps. Patient verbalized understanding and has no further questions.

## 2019-11-15 LAB — TOXASSURE SELECT 13 (MW), URINE

## 2019-11-18 ENCOUNTER — Ambulatory Visit: Payer: Medicare Other | Admitting: Surgery

## 2019-11-27 DIAGNOSIS — J449 Chronic obstructive pulmonary disease, unspecified: Secondary | ICD-10-CM | POA: Diagnosis not present

## 2019-12-03 ENCOUNTER — Ambulatory Visit: Payer: Medicare Other

## 2019-12-03 ENCOUNTER — Other Ambulatory Visit: Payer: Self-pay

## 2019-12-28 DIAGNOSIS — J449 Chronic obstructive pulmonary disease, unspecified: Secondary | ICD-10-CM | POA: Diagnosis not present

## 2020-01-01 ENCOUNTER — Other Ambulatory Visit: Payer: Self-pay | Admitting: Anesthesiology

## 2020-01-07 ENCOUNTER — Other Ambulatory Visit: Payer: Self-pay | Admitting: Anesthesiology

## 2020-01-09 ENCOUNTER — Other Ambulatory Visit: Payer: Self-pay | Admitting: Anesthesiology

## 2020-01-15 ENCOUNTER — Other Ambulatory Visit: Payer: Self-pay

## 2020-01-15 ENCOUNTER — Other Ambulatory Visit: Payer: Self-pay | Admitting: Anesthesiology

## 2020-01-15 ENCOUNTER — Ambulatory Visit (HOSPITAL_BASED_OUTPATIENT_CLINIC_OR_DEPARTMENT_OTHER): Payer: Medicare Other | Admitting: Anesthesiology

## 2020-01-15 ENCOUNTER — Encounter: Payer: Self-pay | Admitting: Anesthesiology

## 2020-01-15 ENCOUNTER — Ambulatory Visit
Admission: RE | Admit: 2020-01-15 | Discharge: 2020-01-15 | Disposition: A | Discharge status: Home / Self Care | Payer: Medicare Other | Source: Ambulatory Visit | Attending: Anesthesiology | Admitting: Anesthesiology

## 2020-01-15 VITALS — BP 132/96 | HR 113 | Temp 97.4°F | Resp 25 | Ht 64.0 in | Wt 245.0 lb

## 2020-01-15 DIAGNOSIS — M545 Low back pain, unspecified: Secondary | ICD-10-CM

## 2020-01-15 DIAGNOSIS — M25552 Pain in left hip: Secondary | ICD-10-CM | POA: Diagnosis not present

## 2020-01-15 DIAGNOSIS — M25551 Pain in right hip: Secondary | ICD-10-CM | POA: Insufficient documentation

## 2020-01-15 DIAGNOSIS — M47816 Spondylosis without myelopathy or radiculopathy, lumbar region: Secondary | ICD-10-CM | POA: Diagnosis not present

## 2020-01-15 DIAGNOSIS — G894 Chronic pain syndrome: Secondary | ICD-10-CM | POA: Diagnosis not present

## 2020-01-15 DIAGNOSIS — M5136 Other intervertebral disc degeneration, lumbar region: Secondary | ICD-10-CM | POA: Insufficient documentation

## 2020-01-15 DIAGNOSIS — M542 Cervicalgia: Secondary | ICD-10-CM | POA: Insufficient documentation

## 2020-01-15 DIAGNOSIS — F1190 Opioid use, unspecified, uncomplicated: Secondary | ICD-10-CM | POA: Insufficient documentation

## 2020-01-15 DIAGNOSIS — R52 Pain, unspecified: Secondary | ICD-10-CM | POA: Insufficient documentation

## 2020-01-15 DIAGNOSIS — M5431 Sciatica, right side: Secondary | ICD-10-CM

## 2020-01-15 MED ORDER — IOHEXOL 180 MG/ML  SOLN
10.0000 mL | Freq: Once | INTRAMUSCULAR | Status: AC | PRN
  Administered 2020-01-15: 10 mL via EPIDURAL

## 2020-01-15 MED ORDER — SODIUM CHLORIDE 0.9% FLUSH
10.0000 mL | Freq: Once | INTRAVENOUS | Status: AC
  Administered 2020-01-15: 10 mL

## 2020-01-15 MED ORDER — MIDAZOLAM HCL 5 MG/5ML IJ SOLN: 5.0000 mg | Freq: Once | INTRAMUSCULAR | Status: DC

## 2020-01-15 MED ORDER — ROPIVACAINE HCL 2 MG/ML IJ SOLN
INTRAMUSCULAR | Status: AC
  Filled 2020-01-15: qty 10

## 2020-01-15 MED ORDER — LIDOCAINE HCL (PF) 1 % IJ SOLN
5.0000 mL | Freq: Once | INTRAMUSCULAR | Status: AC
  Administered 2020-01-15: 5 mL via SUBCUTANEOUS

## 2020-01-15 MED ORDER — FENTANYL CITRATE (PF) 100 MCG/2ML IJ SOLN: 100.0000 ug | Freq: Once | INTRAMUSCULAR | Status: DC

## 2020-01-15 MED ORDER — LIDOCAINE HCL (PF) 1 % IJ SOLN
INTRAMUSCULAR | Status: AC
  Filled 2020-01-15: qty 10

## 2020-01-15 MED ORDER — TRIAMCINOLONE ACETONIDE 40 MG/ML IJ SUSP
INTRAMUSCULAR | Status: AC
  Filled 2020-01-15: qty 1

## 2020-01-15 MED ORDER — LACTATED RINGERS IV SOLN: 1000.0000 mL | INTRAVENOUS | Status: DC

## 2020-01-15 MED ORDER — OXYCODONE HCL 10 MG PO TABS: 10.0000 mg | ORAL_TABLET | Freq: Four times a day (QID) | ORAL | 0 refills | Status: AC

## 2020-01-15 MED ORDER — ROPIVACAINE HCL 2 MG/ML IJ SOLN
10.0000 mL | Freq: Once | INTRAMUSCULAR | Status: AC
  Administered 2020-01-15: 10 mL via EPIDURAL

## 2020-01-15 MED ORDER — SODIUM CHLORIDE (PF) 0.9 % IJ SOLN
INTRAMUSCULAR | Status: AC
  Filled 2020-01-15: qty 10

## 2020-01-15 MED ORDER — TRIAMCINOLONE ACETONIDE 40 MG/ML IJ SUSP
40.0000 mg | Freq: Once | INTRAMUSCULAR | Status: AC
  Administered 2020-01-15: 40 mg

## 2020-01-15 NOTE — Progress Notes (Signed)
Safety precautions to be maintained throughout the outpatient stay will include: orient to surroundings, keep bed in low position, maintain call bell within reach at all times, provide assistance with transfer out of bed and ambulation.  

## 2020-01-15 NOTE — Progress Notes (Signed)
Subjective:  Patient ID: Wendy Johnson, female    DOB: Jan 18, 1966  Age: 54 y.o. MRN: [id number]  CC: Back Pain   Procedure: L5-S1 epidural steroid with fluoroscopic guidance performed without sedation  HPI Wendy Johnson [employer] presents for reevaluation.  She was last seen a few months ago and had her last epidural in July of this year.  She continues to have recurrent low back pain with right posterior lateral leg pain and chronic right hip pain.  She has periodic epidurals to help control her lower back pain and sciatica symptoms.  She reports 75 to 85% relief lasting about 2 months before she has gradual recurrence of the same pain.  Unfortunately she has failed more conservative therapy and no other modalities been effective.  She is try to do physical therapy exercises with limited success.  She tries to do her stretching exercises but they seem to aggravate her back pain.  She is taking her medications as prescribed and these continue to help with pain relief.  She has failed more conservative therapy but mentions that the oxycodone does give her 2 to 4 hours of good relief where she is comfortable before she has recurrence of pain.  She is taking these 4 times a day without side effect. Otherwise no changes in lower extremity strength or function or bowel or bladder function noted at this time Outpatient Medications Prior to Visit  Medication Sig Dispense Refill  . albuterol (PROVENTIL) (2.5 MG/3ML) 0.083% nebulizer solution Take 3 mLs (2.5 mg total) by nebulization every 6 (six) hours as needed for wheezing or shortness of breath. 75 mL 1  . amphetamine-dextroamphetamine (ADDERALL) 15 MG tablet Take 15 mg by mouth daily.     Marland Kitchen amphetamine-dextroamphetamine (ADDERALL) 30 MG tablet Take 30 mg by mouth 2 (two) times daily.    Marland Kitchen atorvastatin (LIPITOR) 40 MG tablet Take 1 tablet (40 mg total) by mouth daily. 90 tablet 1  . busPIRone (BUSPAR) 30 MG tablet Take 30 mg by mouth 2 (two) times daily.      . clonazePAM (KLONOPIN) 1 MG tablet Take 1 mg by mouth 6 (six) times daily.     . cyclobenzaprine (FLEXERIL) 10 MG tablet Take 10 mg by mouth 3 (three) times daily.    Marland Kitchen DEXILANT 60 MG capsule TAKE 1 CAPSULE BY MOUTH DAILY 90 capsule 1  . hydrALAZINE (APRESOLINE) 10 MG tablet Take 1 tablet (10 mg total) by mouth 3 (three) times daily. Prn bp above 140/90 90 tablet 0  . hydrOXYzine (ATARAX/VISTARIL) 50 MG tablet Take by mouth.    Marland Kitchen ibuprofen (ADVIL) 800 MG tablet TAKE 1 TABLET BY MOUTH EVERY 8 HOURS AS NEEDED. 90 tablet 0  . LATUDA 80 MG TABS tablet Take 80 mg by mouth at bedtime.    Marland Kitchen omega-3 acid ethyl esters (LOVAZA) 1 g capsule TAKE TWO CAPSULES BY MOUTH TWICE A DAY 360 capsule 0  . PROAIR HFA 108 (90 Base) MCG/ACT inhaler Inhale 2 puffs into the lungs every 4 (four) hours as needed for wheezing or shortness of breath. 8.5 g 0  . sertraline (ZOLOFT) 100 MG tablet Take 100 mg by mouth 2 (two) times daily.    Marland Kitchen SPIRIVA HANDIHALER 18 MCG inhalation capsule PLACE 1 CAPSULE INTO INHALER AND INHALE DAILY 30 capsule 5  . traZODone (DESYREL) 100 MG tablet Take 400 mg by mouth at bedtime.     . gabapentin (NEURONTIN) 300 MG capsule Take by mouth. (Patient not taking: Reported on 01/15/2020)    .  LATUDA 40 MG TABS tablet Take 40 mg by mouth daily with breakfast.  (Patient not taking: Reported on 01/15/2020)    . LATUDA 60 MG TABS Take 1 tablet by mouth at bedtime. (Patient not taking: Reported on 01/15/2020)    . nicotine (NICODERM CQ - DOSED IN MG/24 HOURS) 21 mg/24hr patch Place 1 patch (21 mg total) onto the skin daily. (Patient not taking: Reported on 01/15/2020) 28 patch 0   No facility-administered medications prior to visit.    Review of Systems CNS: No confusion or sedation Cardiac: No angina or palpitations GI: No abdominal pain or constipation Constitutional: No nausea vomiting fevers or chills  Objective:  BP (!) 132/96   Pulse (!) 113   Temp (!) 97.4 F (36.3 C)   Resp (!) 25   Ht 5'  4" (1.626 m)   Wt 245 lb (111.1 kg)   LMP 09/26/2014 Comment: Total  SpO2 97%   BMI 42.05 kg/m    BP Readings from Last 3 Encounters:  01/15/20 (!) 132/96  11/12/19 (!) 151/111  10/28/19 (!) 180/115     Wt Readings from Last 3 Encounters:  01/15/20 245 lb (111.1 kg)  11/12/19 245 lb (111.1 kg)  10/28/19 249 lb 6.4 oz (113.1 kg)     Physical Exam Pt is alert and oriented PERRL EOMI HEART IS RRR no murmur or rub LCTA no wheezing or rales MUSCULOSKELETAL.  Reveals some paraspinous muscle tenderness of the lumbar region.  No overt trigger points are noted.  Her muscle tone and bulk going to the lower extremities is at baseline.  She has an antalgic gait but her strength is without significant change.  Labs  Lab Results  Component Value Date   HGBA1C 5.3 06/04/2019   HGBA1C 5.3 10/04/2018   HGBA1C 5.5 01/22/2018   Lab Results  Component Value Date   MICROALBUR 0.2 06/04/2019   LDLCALC 106 (H) 06/04/2019   CREATININE 0.50 11/13/2019    -------------------------------------------------------------------------------------------------------------------- Lab Results  Component Value Date   WBC 10.6 06/04/2019   HGB 15.3 06/04/2019   HCT 46.9 (H) 06/04/2019   PLT 291 06/04/2019   GLUCOSE 92 06/04/2019   CHOL 175 06/04/2019   TRIG 194 (H) 06/04/2019   HDL 38 (L) 06/04/2019   LDLCALC 106 (H) 06/04/2019   ALT 11 06/04/2019   AST 10 06/04/2019   NA 139 06/04/2019   K 4.4 06/04/2019   CL 104 06/04/2019   CREATININE 0.50 11/13/2019   BUN 8 06/04/2019   CO2 26 06/04/2019   TSH 0.769 02/03/2016   INR 0.97 10/04/2016   HGBA1C 5.3 06/04/2019   MICROALBUR 0.2 06/04/2019    --------------------------------------------------------------------------------------------------------------------- DG PAIN CLINIC C-ARM 1-60 MIN NO REPORT  Result Date: 01/15/2020 Fluoro was used, but no Radiologist interpretation will be provided. Please refer to "NOTES" tab for provider  progress note.    Assessment & Plan:   Wendy Johnson was seen today for back pain.  Diagnoses and all orders for this visit:  DDD (degenerative disc disease), lumbar  Low back pain at multiple sites  Chronic, continuous use of opioids  Chronic pain syndrome  Sciatica, right side  Pain of both hip joints  Cervicalgia  Facet arthritis of lumbar region  Pain of right hip joint  Other orders -     triamcinolone acetonide (KENALOG-40) injection 40 mg -     sodium chloride flush (NS) 0.9 % injection 10 mL -     ropivacaine (PF) 2 mg/mL (0.2%) (NAROPIN) injection 10  mL -     midazolam (VERSED) 5 MG/5ML injection 5 mg -     lidocaine (PF) (XYLOCAINE) 1 % injection 5 mL -     lactated ringers infusion 1,000 mL -     iohexol (OMNIPAQUE) 180 MG/ML injection 10 mL -     fentaNYL (SUBLIMAZE) injection 100 mcg -     Oxycodone HCl 10 MG TABS; Take 1 tablet (10 mg total) by mouth 4 (four) times daily. -     Oxycodone HCl 10 MG TABS; Take 1 tablet (10 mg total) by mouth 4 (four) times daily.        ----------------------------------------------------------------------------------------------------------------------  Problem List Items Addressed This Visit      Unprioritized   DDD (degenerative disc disease), lumbar - Primary   Relevant Medications   fentaNYL (SUBLIMAZE) injection 100 mcg   Oxycodone HCl 10 MG TABS   Oxycodone HCl 10 MG TABS (Start on 02/14/2020)   Facet arthritis of lumbar region   Relevant Medications   fentaNYL (SUBLIMAZE) injection 100 mcg   Oxycodone HCl 10 MG TABS   Oxycodone HCl 10 MG TABS (Start on 02/14/2020)   Sciatica, right side   Relevant Medications   LATUDA 80 MG TABS tablet   midazolam (VERSED) 5 MG/5ML injection 5 mg    Other Visit Diagnoses    Low back pain at multiple sites       Relevant Medications   triamcinolone acetonide (KENALOG-40) injection 40 mg (Completed)   fentaNYL (SUBLIMAZE) injection 100 mcg   Oxycodone HCl 10 MG TABS    Oxycodone HCl 10 MG TABS (Start on 02/14/2020)   Chronic, continuous use of opioids       Chronic pain syndrome       Relevant Medications   triamcinolone acetonide (KENALOG-40) injection 40 mg (Completed)   ropivacaine (PF) 2 mg/mL (0.2%) (NAROPIN) injection 10 mL (Completed)   lidocaine (PF) (XYLOCAINE) 1 % injection 5 mL (Completed)   fentaNYL (SUBLIMAZE) injection 100 mcg   Oxycodone HCl 10 MG TABS   Oxycodone HCl 10 MG TABS (Start on 02/14/2020)   Pain of both hip joints       Cervicalgia       Pain of right hip joint            ----------------------------------------------------------------------------------------------------------------------  1. DDD (degenerative disc disease), lumbar Based on our discussion today we will proceed with a repeat epidural.  Historically she has had good relief with the epidural injections and desire to proceed with that today.  The risks and benefits once again reviewed in full detail all questions answered.  I want her to continue efforts at weight loss and I have encouraged her to continue with stretching strengthening exercises as tolerated.  No other changes to her pain management protocol will be initiated today.  She will be scheduled for 72-month return to clinic  2. Low back pain at multiple sites .  As above  3. Chronic, continuous use of opioids I have reviewed the [facility] practitioner database information and it is appropriate.  I have also reviewed her urine drug screen.  She is taking Adderall from Dr. Kasandra Knudsen.  She denies any side effects with her current medication therapy.  I will refill the medicines for the next 2 months dated for September 8 and October 8 with schedule return to clinic 2 months.  4. Chronic pain syndrome As above  5. Sciatica, right side Continue core stretching strengthening exercise  6. Pain of both hip joints  7. Cervicalgia We discussed exercises for her neck pain and I talked her once again about  these.  8. Facet arthritis of lumbar region   9. Pain of right hip joint     ----------------------------------------------------------------------------------------------------------------------  I am having Nobie Putnam. Guyette "[name]" start on Oxycodone HCl and Oxycodone HCl. I am also having her maintain her amphetamine-dextroamphetamine, busPIRone, sertraline, clonazePAM, traZODone, albuterol, Latuda, amphetamine-dextroamphetamine, ProAir HFA, omega-3 acid ethyl esters, Spiriva HandiHaler, atorvastatin, hydrALAZINE, ibuprofen, gabapentin, Dexilant, nicotine, cyclobenzaprine, hydrOXYzine, Latuda, and Latuda. We administered triamcinolone acetonide, sodium chloride flush, ropivacaine (PF) 2 mg/mL (0.2%), lidocaine (PF), and iohexol.   Meds ordered this encounter  Medications  . triamcinolone acetonide (KENALOG-40) injection 40 mg  . sodium chloride flush (NS) 0.9 % injection 10 mL  . ropivacaine (PF) 2 mg/mL (0.2%) (NAROPIN) injection 10 mL  . midazolam (VERSED) 5 MG/5ML injection 5 mg  . lidocaine (PF) (XYLOCAINE) 1 % injection 5 mL  . lactated ringers infusion 1,000 mL  . iohexol (OMNIPAQUE) 180 MG/ML injection 10 mL  . fentaNYL (SUBLIMAZE) injection 100 mcg  . Oxycodone HCl 10 MG TABS    Sig: Take 1 tablet (10 mg total) by mouth 4 (four) times daily.    Dispense:  120 tablet    Refill:  0  . Oxycodone HCl 10 MG TABS    Sig: Take 1 tablet (10 mg total) by mouth 4 (four) times daily.    Dispense:  120 tablet    Refill:  0   Patient's Medications  New Prescriptions   OXYCODONE HCL 10 MG TABS    Take 1 tablet (10 mg total) by mouth 4 (four) times daily.   OXYCODONE HCL 10 MG TABS    Take 1 tablet (10 mg total) by mouth 4 (four) times daily.  Previous Medications   ALBUTEROL (PROVENTIL) (2.5 MG/3ML) 0.083% NEBULIZER SOLUTION    Take 3 mLs (2.5 mg total) by nebulization every 6 (six) hours as needed for wheezing or shortness of breath.   AMPHETAMINE-DEXTROAMPHETAMINE (ADDERALL) 15  MG TABLET    Take 15 mg by mouth daily.    AMPHETAMINE-DEXTROAMPHETAMINE (ADDERALL) 30 MG TABLET    Take 30 mg by mouth 2 (two) times daily.   ATORVASTATIN (LIPITOR) 40 MG TABLET    Take 1 tablet (40 mg total) by mouth daily.   BUSPIRONE (BUSPAR) 30 MG TABLET    Take 30 mg by mouth 2 (two) times daily.    CLONAZEPAM (KLONOPIN) 1 MG TABLET    Take 1 mg by mouth 6 (six) times daily.    CYCLOBENZAPRINE (FLEXERIL) 10 MG TABLET    Take 10 mg by mouth 3 (three) times daily.   DEXILANT 60 MG CAPSULE    TAKE 1 CAPSULE BY MOUTH DAILY   GABAPENTIN (NEURONTIN) 300 MG CAPSULE    Take by mouth.   HYDRALAZINE (APRESOLINE) 10 MG TABLET    Take 1 tablet (10 mg total) by mouth 3 (three) times daily. Prn bp above 140/90   HYDROXYZINE (ATARAX/VISTARIL) 50 MG TABLET    Take by mouth.   IBUPROFEN (ADVIL) 800 MG TABLET    TAKE 1 TABLET BY MOUTH EVERY 8 HOURS AS NEEDED.   LATUDA 40 MG TABS TABLET    Take 40 mg by mouth daily with breakfast.    LATUDA 60 MG TABS    Take 1 tablet by mouth at bedtime.   LATUDA 80 MG TABS TABLET    Take 80 mg by mouth at bedtime.   NICOTINE (NICODERM CQ -  DOSED IN MG/24 HOURS) 21 MG/24HR PATCH    Place 1 patch (21 mg total) onto the skin daily.   OMEGA-3 ACID ETHYL ESTERS (LOVAZA) 1 G CAPSULE    TAKE TWO CAPSULES BY MOUTH TWICE A DAY   PROAIR HFA 108 (90 BASE) MCG/ACT INHALER    Inhale 2 puffs into the lungs every 4 (four) hours as needed for wheezing or shortness of breath.   SERTRALINE (ZOLOFT) 100 MG TABLET    Take 100 mg by mouth 2 (two) times daily.   SPIRIVA HANDIHALER 18 MCG INHALATION CAPSULE    PLACE 1 CAPSULE INTO INHALER AND INHALE DAILY   TRAZODONE (DESYREL) 100 MG TABLET    Take 400 mg by mouth at bedtime.   Modified Medications   No medications on file  Discontinued Medications   No medications on file   ----------------------------------------------------------------------------------------------------------------------  Follow-up: Return in about 2 months (around  03/16/2020) for evaluation, med refill.   Procedure: L5-S1 LESI with fluoroscopic guidance and no moderate sedation  NOTE: The risks, benefits, and expectations of the procedure have been discussed and explained to the patient who was understanding and in agreement with suggested treatment plan. No guarantees were made.  DESCRIPTION OF PROCEDURE: Lumbar epidural steroid injection with no IV Versed, EKG, blood pressure, pulse, and pulse oximetry monitoring. The procedure was performed with the patient in the prone position under fluoroscopic guidance.  Sterile prep x3 was initiated and I then injected subcutaneous lidocaine to the overlying L5-S1 site after its fluoroscopic identifictation.  Using strict aseptic technique, I then advanced an 18-gauge Tuohy epidural needle in the midline using interlaminar approach via loss-of-resistance to saline technique. There was negative aspiration for heme or  CSF.  I then confirmed position with both AP and Lateral fluoroscan.  2 cc of contrast dye were injected and a  total of 5 mL of Preservative-Free normal saline mixed with 40 mg of Kenalog and 1cc Ropicaine 0.2 percent were injected incrementally via the  epidurally placed needle. The needle was removed. The patient tolerated the injection well and was convalesced and discharged to home in stable condition. Should the patient have any post procedure difficulty they have been instructed on how to contact us for assistance.    Molli Barrows, MD

## 2020-01-15 NOTE — Progress Notes (Signed)
Nursing Pain Medication Assessment:  Safety precautions to be maintained throughout the outpatient stay will include: orient to surroundings, keep bed in low position, maintain call bell within reach at all times, provide assistance with transfer out of bed and ambulation.  Medication Inspection Compliance: Pill count conducted under aseptic conditions, in front of the patient. Neither the pills nor the bottle was removed from the patient's sight at any time. Once count was completed pills were immediately returned to the patient in their original bottle.  Medication: Oxycodone IR Pill/Patch Count: 0 of 120 pills remain Pill/Patch Appearance: Markings consistent with prescribed medication Bottle Appearance: Standard pharmacy container. Clearly labeled. Filled Date:08 / 07 / 2021 Last Medication intake:  4 days ago

## 2020-01-15 NOTE — Progress Notes (Signed)
1315 Discussed most recent UDS with Dr. Andree Elk. He is aware of all unexpected results.

## 2020-01-16 ENCOUNTER — Telehealth: Payer: Self-pay | Admitting: *Deleted

## 2020-01-16 NOTE — Telephone Encounter (Signed)
Attempted to call for post procedure follow-up. Message left. 

## 2020-01-23 ENCOUNTER — Ambulatory Visit: Payer: Medicare Other

## 2020-01-28 DIAGNOSIS — J449 Chronic obstructive pulmonary disease, unspecified: Secondary | ICD-10-CM | POA: Diagnosis not present

## 2020-02-10 DIAGNOSIS — Z79899 Other long term (current) drug therapy: Secondary | ICD-10-CM | POA: Diagnosis not present

## 2020-02-13 ENCOUNTER — Ambulatory Visit: Payer: Self-pay | Admitting: *Deleted

## 2020-02-13 ENCOUNTER — Other Ambulatory Visit: Payer: Self-pay

## 2020-02-13 ENCOUNTER — Other Ambulatory Visit: Payer: Self-pay | Admitting: Family Medicine

## 2020-02-13 ENCOUNTER — Other Ambulatory Visit: Payer: Medicare Other

## 2020-02-13 DIAGNOSIS — Z20822 Contact with and (suspected) exposure to covid-19: Secondary | ICD-10-CM | POA: Diagnosis not present

## 2020-02-13 DIAGNOSIS — J449 Chronic obstructive pulmonary disease, unspecified: Secondary | ICD-10-CM

## 2020-02-13 NOTE — Progress Notes (Signed)
No visit occurred.

## 2020-02-13 NOTE — Telephone Encounter (Signed)
Patient calls with cough and thick yellow phlegm, runny nose with nasal congestion, "severe headaches" over the last week. Symptoms lingering for almost 3 weeks. No appetite-was vomiting not now. Has SOB with COPD. Using Spiriva but does not have rescue inhaler at this time. No fever.Encouraged fluids, expectorant OTC, NS nasal spray. Using Nyquil nighttime cough but no longer helping. No availability with pcp. Per DT must do virtual visit. No access to mychart or video-must be phone visit. Scheduled for Covid testing this afternoon.   Reason for Disposition . Cough present > 3 weeks  Answer Assessment - Initial Assessment Questions 1. COVID-19 DIAGNOSIS: "Who made your Coronavirus (COVID-19) diagnosis?" "Was it confirmed by a positive lab test?" If not diagnosed by a HCP, ask "Are there lots of cases (community spread) where you live?" (See public health department website, if unsure)     No diagnosis 2. COVID-19 EXPOSURE: "Was there any known exposure to COVID before the symptoms began?" CDC Definition of close contact: within 6 feet (2 meters) for a total of 15 minutes or more over a 24-hour period.      No 3. ONSET: "When did the COVID-19 symptoms start?"      2-3 weeks ago 4. WORST SYMPTOM: "What is your worst symptom?" (e.g., cough, fever, shortness of breath, muscle aches)     Cough with SOB5. COUGH: "Do you have a cough?" If Yes, ask: "How bad is the cough?"       Yes 6. FEVER: "Do you have a fever?" If Yes, ask: "What is your temperature, how was it measured, and when did it start?"     No fever 7. RESPIRATORY STATUS: "Describe your breathing?" (e.g., shortness of breath, wheezing, unable to speak)      SOB 8. BETTER-SAME-WORSE: "Are you getting better, staying the same or getting worse compared to yesterday?"  If getting worse, ask, "In what way?"     Seems worse 9. HIGH RISK DISEASE: "Do you have any chronic medical problems?" (e.g., asthma, heart or lung disease, weak immune system,  obesity, etc.)     COPD 10. PREGNANCY: "Is there any chance you are pregnant?" "When was your last menstrual period?"       na 11. OTHER SYMPTOMS: "Do you have any other symptoms?"  (e.g., chills, fatigue, headache, loss of smell or taste, muscle pain, sore throat; new loss of smell or taste especially support the diagnosis of COVID-19)       "severe HA", loss of smell with runny nose and congestion.No appetite  Protocols used: CORONAVIRUS (COVID-19) DIAGNOSED OR SUSPECTED-A-AH

## 2020-02-14 ENCOUNTER — Encounter: Payer: Medicare Other | Admitting: Internal Medicine

## 2020-02-14 LAB — NOVEL CORONAVIRUS, NAA: SARS-CoV-2, NAA: NOT DETECTED

## 2020-02-14 LAB — SARS-COV-2, NAA 2 DAY TAT

## 2020-02-25 ENCOUNTER — Telehealth: Payer: Self-pay | Admitting: Family Medicine

## 2020-02-25 NOTE — Telephone Encounter (Signed)
Copied from [place] [id number]. Topic: Medicare AWV >> Feb 25, 2020  3:05 PM Cher Nakai R wrote: Reason for CRM:  Left message for patient to call back and schedule the Medicare Annual Wellness Visit (AWV) in office or virtual  Last AWV  07/10/2018  Please schedule at anytime with Bruno.  40 minute appointment  Any questions, please contact me at [phone]

## 2020-02-27 DIAGNOSIS — J449 Chronic obstructive pulmonary disease, unspecified: Secondary | ICD-10-CM | POA: Diagnosis not present

## 2020-02-28 ENCOUNTER — Other Ambulatory Visit: Payer: Self-pay | Admitting: Anesthesiology

## 2020-03-02 ENCOUNTER — Other Ambulatory Visit: Payer: Self-pay | Admitting: Family Medicine

## 2020-03-02 ENCOUNTER — Telehealth: Payer: Self-pay

## 2020-03-02 ENCOUNTER — Telehealth: Payer: Self-pay | Admitting: Family Medicine

## 2020-03-02 DIAGNOSIS — J449 Chronic obstructive pulmonary disease, unspecified: Secondary | ICD-10-CM

## 2020-03-02 NOTE — Telephone Encounter (Signed)
Copied from [place] [id number]. Topic: Referral - Request for Referral >> Mar 02, 2020 11:28 AM Erick Blinks wrote: Has patient seen PCP for this complaint? Yes.   *If NO, is insurance requiring patient see PCP for this issue before PCP can refer them? Referral for which specialty: Dermatology  Preferred provider/office: [facility] recommended in [facility] Reason for referral: Cyst on jaw, no pain. For a month

## 2020-03-02 NOTE — Telephone Encounter (Signed)
Medication Refill - Medication: cyclobenzaprine (FLEXERIL) 10 MG tablet [Pharmacy Med Name: CYCLOBENZAPRINE HCL 10 MG TAB  Pt is completely out of medication  Has the patient contacted their pharmacy? Yes.   (Agent: If no, request that the patient contact the pharmacy for the refill.) (Agent: If yes, when and what did the pharmacy advise?)  Preferred Pharmacy (with phone number or street name):  [facility] [address] [place], [place]  [place] [address]  Phone: [phone] Fax: [phone]     Agent: Please be advised that RX refills may take up to 3 business days. We ask that you follow-up with your pharmacy.

## 2020-03-02 NOTE — Telephone Encounter (Signed)
Requested medication (s) are due for refill today: yes  Requested medication (s) are on the active medication list: yes  Last refill:  ?  Future visit scheduled: no  Notes to clinic:  historical provider    Requested Prescriptions  Pending Prescriptions Disp Refills   cyclobenzaprine (FLEXERIL) 10 MG tablet 30 tablet     Sig: Take 1 tablet (10 mg total) by mouth 3 (three) times daily.      Not Delegated - Analgesics:  Muscle Relaxants Failed - 03/02/2020 11:40 AM      Failed - This refill cannot be delegated      Passed - Valid encounter within last 6 months    Recent Outpatient Visits           4 months ago No-show for appointment   [facility] Delsa Grana, PA-C   7 months ago Chronic pain of right lower extremity   [facility] [place], Drue Stager, MD   8 months ago Gastroesophageal reflux disease without esophagitis   [facility] [place], Drue Stager, MD   9 months ago Dyslipidemia associated with type 2 diabetes mellitus [facility])   [facility] Steele Sizer, MD   1 year ago Acute cystitis with hematuria   [facility] Delsa Grana, PA-C       Future Appointments             In 2 weeks Andree Elk, Alvina Filbert, MD [place]

## 2020-03-03 ENCOUNTER — Other Ambulatory Visit: Payer: Self-pay

## 2020-03-03 DIAGNOSIS — M2740 Unspecified cyst of jaw: Secondary | ICD-10-CM

## 2020-03-03 NOTE — Telephone Encounter (Signed)
lvm to confirm appt

## 2020-03-03 NOTE — Progress Notes (Signed)
Patient requested referral to dermatology for painful cyst on jaw.

## 2020-03-03 NOTE — Telephone Encounter (Signed)
lvm to sch appt medication was not sent in

## 2020-03-03 NOTE — Telephone Encounter (Signed)
PT is schedule for 03/19/20 first available / asking for a refill on her meds in till then / please advise

## 2020-03-04 ENCOUNTER — Other Ambulatory Visit: Payer: Self-pay

## 2020-03-04 NOTE — Telephone Encounter (Signed)
Pt scheduled next available appt for 11.11.21

## 2020-03-04 NOTE — Telephone Encounter (Signed)
Pt stated she has called several time for Dexilant RX refill and asked if this can be sent today asap / please advise

## 2020-03-04 NOTE — Telephone Encounter (Signed)
Pt has an appt sch for 03-19-2020 and would like a refill or enough medication of dexilant until her appt. [facility] in [place]

## 2020-03-05 MED ORDER — DEXILANT 60 MG PO CPDR
1.0000 | DELAYED_RELEASE_CAPSULE | Freq: Every day | ORAL | 0 refills | Status: DC
Start: 2020-03-05 — End: 2020-03-19

## 2020-03-16 ENCOUNTER — Other Ambulatory Visit: Payer: Self-pay

## 2020-03-16 ENCOUNTER — Encounter: Payer: Self-pay | Admitting: Anesthesiology

## 2020-03-16 ENCOUNTER — Ambulatory Visit: Payer: Medicare Other | Attending: Anesthesiology | Admitting: Anesthesiology

## 2020-03-16 DIAGNOSIS — F1190 Opioid use, unspecified, uncomplicated: Secondary | ICD-10-CM | POA: Diagnosis not present

## 2020-03-16 DIAGNOSIS — M47816 Spondylosis without myelopathy or radiculopathy, lumbar region: Secondary | ICD-10-CM

## 2020-03-16 DIAGNOSIS — M51369 Other intervertebral disc degeneration, lumbar region without mention of lumbar back pain or lower extremity pain: Secondary | ICD-10-CM

## 2020-03-16 DIAGNOSIS — M25551 Pain in right hip: Secondary | ICD-10-CM

## 2020-03-16 DIAGNOSIS — M5431 Sciatica, right side: Secondary | ICD-10-CM

## 2020-03-16 DIAGNOSIS — G894 Chronic pain syndrome: Secondary | ICD-10-CM | POA: Diagnosis not present

## 2020-03-16 DIAGNOSIS — M545 Low back pain, unspecified: Secondary | ICD-10-CM | POA: Diagnosis not present

## 2020-03-16 DIAGNOSIS — M1712 Unilateral primary osteoarthritis, left knee: Secondary | ICD-10-CM | POA: Diagnosis not present

## 2020-03-16 DIAGNOSIS — M25552 Pain in left hip: Secondary | ICD-10-CM

## 2020-03-16 DIAGNOSIS — M5136 Other intervertebral disc degeneration, lumbar region: Secondary | ICD-10-CM | POA: Diagnosis not present

## 2020-03-16 DIAGNOSIS — M7061 Trochanteric bursitis, right hip: Secondary | ICD-10-CM | POA: Diagnosis not present

## 2020-03-16 DIAGNOSIS — M542 Cervicalgia: Secondary | ICD-10-CM

## 2020-03-16 MED ORDER — OXYCODONE HCL 10 MG PO TABS: 10.0000 mg | ORAL_TABLET | Freq: Four times a day (QID) | ORAL | 0 refills | Status: AC

## 2020-03-16 MED ORDER — OXYCODONE HCL 10 MG PO TABS
10.0000 mg | ORAL_TABLET | Freq: Four times a day (QID) | ORAL | 0 refills | Status: DC
Start: 2020-04-15 — End: 2020-05-14

## 2020-03-16 MED ORDER — NALOXONE HCL 4 MG/0.1ML NA LIQD: NASAL | 1 refills | Status: DC

## 2020-03-16 NOTE — Progress Notes (Signed)
Virtual Visit via Telephone Note  I connected with Wendy Johnson on 03/16/20 at  1:30 PM EST by telephone and verified that I am speaking with the correct person using two identifiers.  Location: Patient: Home Provider: Pain control center   I discussed the limitations, risks, security and privacy concerns of performing an evaluation and management service by telephone and the availability of in person appointments. I also discussed with the patient that there may be a patient responsible charge related to this service. The patient expressed understanding and agreed to proceed.   History of Present Illness: I spoke with Wendy Johnson today via telephone as she was unable to do the video portion of the virtual conference.  She reports that she is doing well with her periodic epidural steroid injections for her low back and sciatica symptoms.  Her last injection was back in September and she is had some recurrence of similar pain affecting the right lower extremity and low back.  This is the same quality pain and characteristic pain that she is had in the past.  No changes in lower extremity strength or function or bowel or bladder function are noted.  She still getting some right hip pain and recently had this injected by orthopedics.  She still having some left knee pain she reports also being followed by them.  No other changes are reported in her pain status at this point.  She is tolerating the medications well with no side effects.  She reports that she is not taking her Klonopin at dosing times for her opioids.   Observations/Objective:  Current Outpatient Medications:  .  albuterol (PROVENTIL) (2.5 MG/3ML) 0.083% nebulizer solution, Take 3 mLs (2.5 mg total) by nebulization every 6 (six) hours as needed for wheezing or shortness of breath., Disp: 75 mL, Rfl: 1 .  amphetamine-dextroamphetamine (ADDERALL) 15 MG tablet, Take 15 mg by mouth daily. , Disp: , Rfl:  .  amphetamine-dextroamphetamine  (ADDERALL) 30 MG tablet, Take 30 mg by mouth 2 (two) times daily., Disp: , Rfl:  .  atorvastatin (LIPITOR) 40 MG tablet, Take 1 tablet (40 mg total) by mouth daily., Disp: 90 tablet, Rfl: 1 .  busPIRone (BUSPAR) 30 MG tablet, Take 30 mg by mouth 2 (two) times daily. , Disp: , Rfl:  .  clonazePAM (KLONOPIN) 1 MG tablet, Take 1 mg by mouth 6 (six) times daily. , Disp: , Rfl:  .  cyclobenzaprine (FLEXERIL) 10 MG tablet, Take 10 mg by mouth 3 (three) times daily., Disp: , Rfl:  .  dexlansoprazole (DEXILANT) 60 MG capsule, Take 1 capsule (60 mg total) by mouth daily., Disp: 30 capsule, Rfl: 0 .  gabapentin (NEURONTIN) 300 MG capsule, Take by mouth., Disp: , Rfl:  .  hydrALAZINE (APRESOLINE) 10 MG tablet, Take 1 tablet (10 mg total) by mouth 3 (three) times daily. Prn bp above 140/90, Disp: 90 tablet, Rfl: 0 .  hydrOXYzine (ATARAX/VISTARIL) 50 MG tablet, Take by mouth., Disp: , Rfl:  .  ibuprofen (ADVIL) 800 MG tablet, TAKE 1 TABLET BY MOUTH EVERY 8 HOURS AS NEEDED., Disp: 90 tablet, Rfl: 0 .  LATUDA 40 MG TABS tablet, Take 40 mg by mouth daily with breakfast. , Disp: , Rfl:  .  LATUDA 60 MG TABS, Take 1 tablet by mouth at bedtime., Disp: , Rfl:  .  LATUDA 80 MG TABS tablet, Take 80 mg by mouth at bedtime., Disp: , Rfl:  .  nicotine (NICODERM CQ - DOSED IN MG/24 HOURS) 21  mg/24hr patch, Place 1 patch (21 mg total) onto the skin daily., Disp: 28 patch, Rfl: 0 .  omega-3 acid ethyl esters (LOVAZA) 1 g capsule, TAKE TWO CAPSULES BY MOUTH TWICE A DAY, Disp: 360 capsule, Rfl: 0 .  PROAIR HFA 108 (90 Base) MCG/ACT inhaler, INHALE 2 PUFFS BY MOUTH 4 TIMES A DAY, Disp: 8.5 g, Rfl: 0 .  sertraline (ZOLOFT) 100 MG tablet, Take 100 mg by mouth 2 (two) times daily., Disp: , Rfl:  .  SPIRIVA HANDIHALER 18 MCG inhalation capsule, PLACE 1 CAPSULE INTO INHALER AND INHALE DAILY, Disp: 30 capsule, Rfl: 5 .  traZODone (DESYREL) 100 MG tablet, Take 400 mg by mouth at bedtime. , Disp: , Rfl:  .  naloxone (NARCAN) nasal  spray 4 mg/0.1 mL, As directed for opioid induced respiratory depression, Disp: 1 each, Rfl: 1 .  Oxycodone HCl 10 MG TABS, Take 1 tablet (10 mg total) by mouth in the morning, at noon, in the evening, and at bedtime., Disp: 120 tablet, Rfl: 0 .  [START ON 04/15/2020] Oxycodone HCl 10 MG TABS, Take 1 tablet (10 mg total) by mouth in the morning, at noon, in the evening, and at bedtime., Disp: 120 tablet, Rfl: 0  Assessment and Plan:  1. DDD (degenerative disc disease), lumbar   2. Low back pain at multiple sites   3. Chronic, continuous use of opioids   4. Chronic pain syndrome   5. Sciatica, right side   6. Pain of both hip joints   7. Cervicalgia   8. Facet arthritis of lumbar region   9. Pain of right hip joint   Based on our discussion today and upon review of the [facility] practitioner database information going to refill her medications for today and for December 8.  We talked about the risks and benefits of concomitant use of opioids and benzodiazepines.  I have sent in a prescription for Narcan for her for respiratory depressant potential effect and she understands how to utilize this as discussed today.  I have also reiterated the importance of avoiding concomitant use of opioids and benzodiazepines.  Furthermore I want her to continue with her stretching strengthening exercises and efforts at weight loss.  She is due for another epidural she reports in a month.  We will assist with that today.  I want her to continue follow-up with her primary care physicians for baseline medical care. Follow Up Instructions:    I discussed the assessment and treatment plan with the patient. The patient was provided an opportunity to ask questions and all were answered. The patient agreed with the plan and demonstrated an understanding of the instructions.   The patient was advised to call back or seek an in-person evaluation if the symptoms worsen or if the condition fails to improve as  anticipated.  I provided 30 minutes of non-face-to-face time during this encounter.   Molli Barrows, MD

## 2020-03-17 NOTE — Progress Notes (Signed)
Name: Wendy Johnson   MRN: [id number]    DOB: 07-06-1965   Date:03/19/2020       Progress Note  Subjective  Chief Complaint  Medication Refill  HPI   GERD: she continues to have heart burn, had normal EGD back in the Spring of 2021, discussed avoiding trigger foods and nsaids   Left lower rib cage pain: she fell about one week ago and hit left rib cage on the floor, she is still having pain, she would like a x-ray. It is not causing any breathing problems.   HTN: she states bp at home has been showing DBP above 90, she has some hydralazine prn, we will try starting on Diovan, she uses CPAP at night. Monitor bp and let me know if needed. No chest pain or palpitation   History of DM : A1C has been normal for a while now and is off medication Denies polyphagia, polydipsia or polyuria.She still has low HDL, LDL elevated and triglycerides up. She has not been taking Atorvastatin or Lovaza, we will recheck labs next visit   Dyslipidemia: She has high triglycerides , low HDl and high LDL, she states she ran out of statin and also Lovaza and will resume it now, we will recheck labs on her next visit   DDD lumbar spine: she sees pain clinic- Dr Humberto Seals is on Oxycodone 10 mg QID, denies constipation - controlled with Benifiber, she has narcan at home. She also has OA of knee and shoulders and sees Ortho at [facility] and has steroid injections   Morbid obesity:her maximum weight was around 273lbs, she lost down to 184 lbs with physical activity and life style modification,she gradually gained weight back but is losing it again, her weight has been stable around 250 lbs for about one year, today is down to 241.9 lbs . She is cutting down on portion size, has also been avoiding sodas, eating more vegetables  Bipolar disorder: on multiple medications , given by Dr. Kasandra Knudsen. Reviewed medications with her   COPD: she states that she could not tolerate Breo, she has been on Spiriva, no  cough or SOB , she has not been using oxygen at night because she does not like using it, states too big. . Uses albuterol on machineand prn when at home. Denies daily cough, she has occasional wheeze at night. She would like to start lung cancer screen   Alcohol history: she completely quit Feb 2017 but had a relapse, she has quit again since June 2020   Patient Active Problem List   Diagnosis Date Noted   Esophageal dysphagia    History of colonic polyps    Sciatica, right side 02/18/2019   Pain in joint, shoulder region 12/19/2018   Unspecified inflammatory spondylopathy, lumbar region ([place]) 10/04/2018   BMI 40.0-44.9, adult ([place]) 08/17/2018   Lumbar spondylosis 03/19/2018   Polyarthralgia 03/19/2018   History of alcoholism ([place]) 07/19/2017   Bilateral carpal tunnel syndrome 05/29/2017   Painful total knee replacement, right ([place]) 10/11/2016   Rotator cuff tendinitis, right 07/29/2016   Occipital neuralgia 10/12/2015   Instability of prosthetic knee ([place]) 09/22/2015   Primary osteoarthritis of right hip 05/12/2015   Sleep apnea 04/07/2015   Primary osteoarthritis of left hip 94/76/5465   Metabolic syndrome 03/54/6568   Migraine without aura and without status migrainosus, not intractable 11/26/2014   GERD without esophagitis 11/26/2014   COPD, moderate ([place]) 11/26/2014   Nocturnal oxygen desaturation 11/26/2014   Supplemental oxygen dependent 11/26/2014  Hearing loss 11/26/2014   Pain syndrome, chronic 11/26/2014   History of hypertension 11/26/2014   Dyslipidemia 11/26/2014   Morbid obesity ([place]) 11/26/2014   Dyslipidemia associated with type 2 diabetes mellitus ([place]) 11/26/2014   Chronic constipation 11/26/2014   Generalized anxiety disorder 11/11/2014   DDD (degenerative disc disease), lumbar 11/11/2014   Facet arthritis of lumbar region 11/11/2014   Primary osteoarthritis involving multiple joints 11/11/2014   H/O hysterectomy for  benign disease 10/20/2014   Low back derangement syndrome 09/30/2014   Bipolar disorder ([place]) 09/30/2014    Past Surgical History:  Procedure Laterality Date   ABDOMINAL HYSTERECTOMY N/A 10/20/2014   Procedure: Total abdominial hysterectomy, bilateral salpingo-oophorectomy;  Surgeon: Brayton Mars, MD;  Location: [facility];  Service: Gynecology;  Laterality: N/A;   BILATERAL SALPINGOOPHORECTOMY     bone spurs removed Bilateral    CARPAL TUNNEL RELEASE Left 06/08/2017   Procedure: CARPAL TUNNEL RELEASE;  Surgeon: Hessie Knows, MD;  Location: [facility];  Service: Orthopedics;  Laterality: Left;   CARPAL TUNNEL RELEASE Right 10/10/2017   Procedure: CARPAL TUNNEL RELEASE;  Surgeon: Hessie Knows, MD;  Location: [facility];  Service: Orthopedics;  Laterality: Right;   COLONOSCOPY WITH PROPOFOL N/A 09/01/2016   Procedure: COLONOSCOPY WITH PROPOFOL;  Surgeon: Jonathon Bellows, MD;  Location: [facility] END[facility]COPY;  Service: Endoscopy;  Laterality: N/A;   COLONOSCOPY WITH PROPOFOL N/A 07/30/2019   Procedure: COLONOSCOPY WITH PROPOFOL;  Surgeon: Lucilla Lame, MD;  Location: [facility] ENDOSCOPY;  Service: Endoscopy;  Laterality: N/A;   DORSAL COMPARTMENT RELEASE Left 06/08/2017   Procedure: RELEASE DORSAL COMPARTMENT (DEQUERVAIN);  Surgeon: Hessie Knows, MD;  Location: [facility];  Service: Orthopedics;  Laterality: Left;   ESOPHAGOGASTRODUODENOSCOPY (EGD) WITH PROPOFOL N/A 09/01/2016   Procedure: ESOPHAGOGASTRODUODENOSCOPY (EGD) WITH PROPOFOL;  Surgeon: Jonathon Bellows, MD;  Location: [facility];  Service: Endoscopy;  Laterality: N/A;   ESOPHAGOGASTRODUODENOSCOPY (EGD) WITH PROPOFOL N/A 07/30/2019   Procedure: ESOPHAGOGASTRODUODENOSCOPY (EGD) WITH PROPOFOL;  Surgeon: Lucilla Lame, MD;  Location: [facility] ENDOSCOPY;  Service: Endoscopy;  Laterality: N/A;   FOOT SURGERY Bilateral    INSERTION OF MESH N/A 12/26/2017   Procedure: INSERTION OF MESH;  Surgeon: Jules Husbands, MD;  Location: [facility];  Service: General;   Laterality: N/A;   JOINT REPLACEMENT Right    Total Knee replacement X 2   JOINT REPLACEMENT Bilateral    Total Hip Replacement   knee arthroscopo Right    LAPAROSCOPY  09/22/2014   Procedure: LAPAROSCOPY OPERATIVE;  Surgeon: Brayton Mars, MD;  Location: [facility];  Service: Gynecology;;  excision and fulgeration of endomertriosis   LIPOMA EXCISION     ROBOTIC ASSISTED LAPAROSCOPIC VENTRAL/INCISIONAL HERNIA REPAIR N/A 12/26/2017   Procedure: ROBOTIC ASSISTED LAPAROSCOPIC VENTRAL/INCISIONAL [place];  Surgeon: Jules Husbands, MD;  Location: [facility];  Service: General;  Laterality: N/A;   TONSILLECTOMY     TOTAL HIP ARTHROPLASTY Left 12/16/2014   Procedure: TOTAL HIP ARTHROPLASTY ANTERIOR APPROACH;  Surgeon: Hessie Knows, MD;  Location: [facility];  Service: Orthopedics;  Laterality: Left;   TOTAL HIP ARTHROPLASTY Right 05/12/2015   Procedure: TOTAL HIP ARTHROPLASTY ANTERIOR APPROACH;  Surgeon: Hessie Knows, MD;  Location: [facility];  Service: Orthopedics;  Laterality: Right;   TOTAL KNEE REVISION Right 09/22/2015   Procedure: TOTAL KNEE REVISION/ REVISE POLYIETHYLENE;  Surgeon: Hessie Knows, MD;  Location: [facility];  Service: Orthopedics;  Laterality: Right;   TOTAL KNEE REVISION Right 10/11/2016   Procedure: TOTAL KNEE REVISION;  Surgeon: Hessie Knows, MD;  Location: [facility];  Service: Orthopedics;  Laterality: Right;   TUBAL LIGATION     VENTRAL HERNIA REPAIR N/A 11/08/2018   Procedure: HERNIA REPAIR VENTRAL ADULT OPEN. DIABETIC, SLEEP APNEA;  Surgeon: Jules Husbands, MD;  Location: [facility];  Service: General;  Laterality: N/A;    Family History  Problem Relation Age of Onset   Diabetes Mother    Heart disease Father    Heart attack Father    Diabetes Sister    Breast cancer Maternal Aunt        <50   Breast cancer Paternal Aunt        x2.  <50   Healthy Son    Drug abuse Sister     Social History   Tobacco Use   Smoking status: Current Every Day  Smoker    Packs/day: 0.50    Years: 37.00    Pack years: 18.50    Types: Cigarettes    Start date: 11/26/1979   Smokeless tobacco: Never Used  Substance Use Topics   Alcohol use: No    Alcohol/week: 0.0 standard drinks    Comment: but used to be a heavy drinker, quit after DUI 2021     Current Outpatient Medications:    albuterol (PROVENTIL) (2.5 MG/3ML) 0.083% nebulizer solution, Take 3 mLs (2.5 mg total) by nebulization every 6 (six) hours as needed for wheezing or shortness of breath., Disp: 75 mL, Rfl: 1   amphetamine-dextroamphetamine (ADDERALL) 15 MG tablet, Take 15 mg by mouth daily. , Disp: , Rfl:    amphetamine-dextroamphetamine (ADDERALL) 30 MG tablet, Take 30 mg by mouth 2 (two) times daily., Disp: , Rfl:    busPIRone (BUSPAR) 30 MG tablet, Take 30 mg by mouth 2 (two) times daily. , Disp: , Rfl:    clonazePAM (KLONOPIN) 1 MG tablet, Take 1 mg by mouth 6 (six) times daily. , Disp: , Rfl:    cyclobenzaprine (FLEXERIL) 10 MG tablet, Take 10 mg by mouth 3 (three) times daily., Disp: , Rfl:    dexlansoprazole (DEXILANT) 60 MG capsule, Take 1 capsule (60 mg total) by mouth daily., Disp: 30 capsule, Rfl: 0   gabapentin (NEURONTIN) 300 MG capsule, Take by mouth., Disp: , Rfl:    hydrALAZINE (APRESOLINE) 10 MG tablet, Take 1 tablet (10 mg total) by mouth 3 (three) times daily. Prn bp above 140/90, Disp: 90 tablet, Rfl: 0   hydrOXYzine (ATARAX/VISTARIL) 50 MG tablet, Take by mouth., Disp: , Rfl:    ibuprofen (ADVIL) 800 MG tablet, TAKE 1 TABLET BY MOUTH EVERY 8 HOURS AS NEEDED., Disp: 90 tablet, Rfl: 0   LATUDA 80 MG TABS tablet, Take 80 mg by mouth at bedtime., Disp: , Rfl:    naloxone (NARCAN) nasal spray 4 mg/0.1 mL, As directed for opioid induced respiratory depression, Disp: 1 each, Rfl: 1   omega-3 acid ethyl esters (LOVAZA) 1 g capsule, TAKE TWO CAPSULES BY MOUTH TWICE A DAY, Disp: 360 capsule, Rfl: 0   Oxycodone HCl 10 MG TABS, Take 1 tablet (10 mg total) by  mouth in the morning, at noon, in the evening, and at bedtime., Disp: 120 tablet, Rfl: 0   [START ON 04/15/2020] Oxycodone HCl 10 MG TABS, Take 1 tablet (10 mg total) by mouth in the morning, at noon, in the evening, and at bedtime., Disp: 120 tablet, Rfl: 0   PROAIR HFA 108 (90 Base) MCG/ACT inhaler, INHALE 2 PUFFS BY MOUTH 4 TIMES A DAY, Disp: 8.5 g, Rfl: 0   sertraline (ZOLOFT) 100 MG tablet,  Take 100 mg by mouth 2 (two) times daily., Disp: , Rfl:    SPIRIVA HANDIHALER 18 MCG inhalation capsule, PLACE 1 CAPSULE INTO INHALER AND INHALE DAILY, Disp: 30 capsule, Rfl: 5   traZODone (DESYREL) 100 MG tablet, Take 400 mg by mouth at bedtime. , Disp: , Rfl:    atorvastatin (LIPITOR) 40 MG tablet, Take 1 tablet (40 mg total) by mouth daily. (Patient not taking: Reported on 03/19/2020), Disp: 90 tablet, Rfl: 1   LATUDA 40 MG TABS tablet, Take 40 mg by mouth daily with breakfast.  (Patient not taking: Reported on 03/19/2020), Disp: , Rfl:    LATUDA 60 MG TABS, Take 1 tablet by mouth at bedtime. (Patient not taking: Reported on 03/19/2020), Disp: , Rfl:    nicotine (NICODERM CQ - DOSED IN MG/24 HOURS) 21 mg/24hr patch, Place 1 patch (21 mg total) onto the skin daily. (Patient not taking: Reported on 03/19/2020), Disp: 28 patch, Rfl: 0   predniSONE (DELTASONE) 10 MG tablet, Take by mouth. (Patient not taking: Reported on 03/19/2020), Disp: , Rfl:   Allergies  Allergen Reactions   Codeine Nausea Only   Imitrex [Sumatriptan] Other (See Comments)    Headaches    I personally reviewed active problem list, medication list, allergies, family history, social history, health maintenance with the patient/caregiver today.   ROS  Constitutional: Negative for fever , positive for  weight change.  Respiratory: denies  cough but has intermittent  shortness of breath.   Cardiovascular: Negative for chest pain or palpitations.  Gastrointestinal: Negative for abdominal pain, no bowel changes.   Musculoskeletal: positive  for gait problem and intermittent  joint swelling.  Skin: Negative for rash.  Neurological: Negative for dizziness or headache.  No other specific complaints in a complete review of systems (except as listed in HPI above). Objective  Vitals:   03/19/20 1023  BP: (!) 136/92  Pulse: 90  Resp: 18  Temp: 97.6 F (36.4 C)  TempSrc: Oral  SpO2: 96%  Weight: 241 lb 14.4 oz (109.7 kg)  Height: 5\' 4"  (1.626 m)    Body mass index is 41.52 kg/m.  Physical Exam  Constitutional: Patient appears well-developed and well-nourished. Obese No distress.  HEENT: head atraumatic, normocephalic, pupils equal and reactive to light,  neck supple Cardiovascular: Normal rate, regular rhythm and normal heart sounds.  No murmur heard. No BLE edema. Pulmonary/Chest: Effort normal and breath sounds normal. No respiratory distress. Abdominal: Soft.  There is no tenderness. Psychiatric: Patient has a normal mood and affect. behavior is normal. Judgment and thought content normal.  Recent Results (from the past 2160 hour(s))  Novel Coronavirus, NAA ([facility])     Status: None   Collection Time: 02/13/20  5:38 PM   Specimen: Oropharyngeal(OP) collection in vial transport medium   Oropharyngea  Is this  Result Value Ref Range   SARS-CoV-2, NAA Not Detected Not Detected    Comment: This nucleic acid amplification test was developed and its performance characteristics determined by [employer]. Nucleic acid amplification tests include RT-PCR and TMA. This test has not been FDA cleared or approved. This test has been authorized by FDA under an Emergency Use Authorization (EUA). This test is only authorized for the duration of time the declaration that circumstances exist justifying the authorization of the emergency use of in vitro diagnostic tests for detection of SARS-CoV-2 virus and/or diagnosis of COVID-19 infection under section 564(b)(1) of the Act, 21  U.S.C. 993TTS-1(X) (1), unless the authorization is terminated or revoked sooner.  When diagnostic testing is negative, the possibility of a false negative result should be considered in the context of a patient's recent exposures and the presence of clinical signs and symptoms consistent with COVID-19. An individual without symptoms of COVID-19 and who is not shedding SARS-CoV-2 virus wo uld expect to have a negative (not detected) result in this assay.   SARS-COV-2, NAA 2 DAY TAT     Status: None   Collection Time: 02/13/20  5:38 PM   Oropharyngea  Is this  Result Value Ref Range   SARS-CoV-2, NAA 2 DAY TAT Performed      PHQ2/9: Depression screen [facility] 2/9 03/19/2020 01/15/2020 11/12/2019 07/17/2019 06/18/2019  Decreased Interest 1 0 0 1 0  Down, Depressed, Hopeless 1 0 0 1 1  PHQ - 2 Score 2 0 0 2 1  Altered sleeping 2 - - 0 0  Tired, decreased energy 1 - - 1 1  Change in appetite 0 - - 0 0  Feeling bad or failure about yourself  0 - - 0 0  Trouble concentrating 0 - - 2 0  Moving slowly or fidgety/restless 0 - - 0 0  Suicidal thoughts 0 - - 0 0  PHQ-9 Score 5 - - 5 2  Difficult doing work/chores - - - Somewhat difficult Not difficult at all  Some recent data might be hidden    phq 9 is positive   Fall Risk: Fall Risk  03/19/2020 01/15/2020 11/12/2019 07/17/2019 06/18/2019  Falls in the past year? 1 0 0 0 0  Number falls in past yr: 0 - - 0 0  Injury with Fall? 1 - - 0 0  Comment Ribs - - - -  Risk for fall due to : - - - - -  Follow up - - - - -  Comment - - - - -     Functional Status Survey: Is the patient deaf or have difficulty hearing?: Yes Does the patient have difficulty seeing, even when wearing glasses/contacts?: No Does the patient have difficulty concentrating, remembering, or making decisions?: Yes Does the patient have difficulty walking or climbing stairs?: Yes Does the patient have difficulty dressing or bathing?: No Does the patient have difficulty doing  errands alone such as visiting a doctor's office or shopping?: No   Assessment & Plan  1. History of diabetes mellitus, type II  - atorvastatin (LIPITOR) 40 MG tablet; Take 1 tablet (40 mg total) by mouth daily.  Dispense: 90 tablet; Refill: 1 - omega-3 acid ethyl esters (LOVAZA) 1 g capsule; Take 2 capsules (2 g total) by mouth 2 (two) times daily.  Dispense: 360 capsule; Refill: 1  2. Need for hepatitis C screening test   3. Need for immunization against influenza  - Flu Vaccine QUAD 36+ mos IM  4. Rib pain  - DG Ribs Unilateral Left; Future  5. History of recent fall   6. Gastroesophageal reflux disease without esophagitis  - dexlansoprazole (DEXILANT) 60 MG capsule; Take 1 capsule (60 mg total) by mouth daily.  Dispense: 90 capsule; Refill: 1  7. History of alcoholism ([place])  In remission   8. Morbid obesity with BMI of 40.0-44.9, adult [facility])  Discussed with the patient the risk posed by an increased BMI. Discussed importance of portion control, calorie counting and at least 150 minutes of physical activity weekly. Avoid sweet beverages and drink more water. Eat at least 6 servings of fruit and vegetables daily   9. COPD, moderate ([place])  -  tiotropium (SPIRIVA HANDIHALER) 18 MCG inhalation capsule; Place 1 capsule (18 mcg total) into inhaler and inhale daily.  Dispense: 30 capsule; Refill: 5  10. Pain syndrome, chronic  Under the care of pain clinic   11. Bipolar affective disorder, currently depressed, mild ([place])  Sees Dr. Su  12. Primary osteoarthritis of left knee   13. Hypertension, benign  - valsartan (DIOVAN) 80 MG tablet; Take 1 tablet (80 mg total) by mouth daily.  Dispense: 90 tablet; Refill: 1  14. Screening for lung cancer  - CT CHEST LUNG CA SCREEN LOW DOSE W/O CM; Future

## 2020-03-19 ENCOUNTER — Ambulatory Visit (INDEPENDENT_AMBULATORY_CARE_PROVIDER_SITE_OTHER): Payer: Medicare Other | Admitting: Family Medicine

## 2020-03-19 ENCOUNTER — Encounter: Payer: Self-pay | Admitting: Family Medicine

## 2020-03-19 ENCOUNTER — Other Ambulatory Visit: Payer: Self-pay

## 2020-03-19 VITALS — BP 136/92 | HR 90 | Temp 97.6°F | Resp 18 | Ht 64.0 in | Wt 241.9 lb

## 2020-03-19 DIAGNOSIS — J449 Chronic obstructive pulmonary disease, unspecified: Secondary | ICD-10-CM

## 2020-03-19 DIAGNOSIS — Z23 Encounter for immunization: Secondary | ICD-10-CM | POA: Diagnosis not present

## 2020-03-19 DIAGNOSIS — Z122 Encounter for screening for malignant neoplasm of respiratory organs: Secondary | ICD-10-CM

## 2020-03-19 DIAGNOSIS — G894 Chronic pain syndrome: Secondary | ICD-10-CM

## 2020-03-19 DIAGNOSIS — Z1159 Encounter for screening for other viral diseases: Secondary | ICD-10-CM

## 2020-03-19 DIAGNOSIS — Z8639 Personal history of other endocrine, nutritional and metabolic disease: Secondary | ICD-10-CM

## 2020-03-19 DIAGNOSIS — M2550 Pain in unspecified joint: Secondary | ICD-10-CM

## 2020-03-19 DIAGNOSIS — M1712 Unilateral primary osteoarthritis, left knee: Secondary | ICD-10-CM

## 2020-03-19 DIAGNOSIS — E1169 Type 2 diabetes mellitus with other specified complication: Secondary | ICD-10-CM

## 2020-03-19 DIAGNOSIS — I10 Essential (primary) hypertension: Secondary | ICD-10-CM

## 2020-03-19 DIAGNOSIS — K219 Gastro-esophageal reflux disease without esophagitis: Secondary | ICD-10-CM

## 2020-03-19 DIAGNOSIS — R0781 Pleurodynia: Secondary | ICD-10-CM

## 2020-03-19 DIAGNOSIS — F3131 Bipolar disorder, current episode depressed, mild: Secondary | ICD-10-CM

## 2020-03-19 DIAGNOSIS — Z6841 Body Mass Index (BMI) 40.0 and over, adult: Secondary | ICD-10-CM

## 2020-03-19 DIAGNOSIS — Z9181 History of falling: Secondary | ICD-10-CM

## 2020-03-19 DIAGNOSIS — F1021 Alcohol dependence, in remission: Secondary | ICD-10-CM

## 2020-03-19 MED ORDER — DEXILANT 60 MG PO CPDR: 1.0000 | DELAYED_RELEASE_CAPSULE | Freq: Every day | ORAL | 1 refills | Status: DC

## 2020-03-19 MED ORDER — VALSARTAN 80 MG PO TABS: 80.0000 mg | ORAL_TABLET | Freq: Every day | ORAL | 1 refills | Status: DC

## 2020-03-19 MED ORDER — SPIRIVA HANDIHALER 18 MCG IN CAPS: 18.0000 ug | ORAL_CAPSULE | Freq: Every day | RESPIRATORY_TRACT | 5 refills | Status: DC

## 2020-03-19 MED ORDER — ATORVASTATIN CALCIUM 40 MG PO TABS: 40.0000 mg | ORAL_TABLET | Freq: Every day | ORAL | 1 refills | Status: DC

## 2020-03-19 MED ORDER — IBUPROFEN 800 MG PO TABS: 800.0000 mg | ORAL_TABLET | Freq: Three times a day (TID) | ORAL | 0 refills | Status: DC | PRN

## 2020-03-19 MED ORDER — OMEGA-3-ACID ETHYL ESTERS 1 G PO CAPS: 2.0000 | ORAL_CAPSULE | Freq: Two times a day (BID) | ORAL | 1 refills | Status: DC

## 2020-03-20 ENCOUNTER — Telehealth: Payer: Self-pay | Admitting: Family Medicine

## 2020-03-20 NOTE — Telephone Encounter (Signed)
Copied from [place] [id number]. Topic: Medicare AWV >> Mar 20, 2020  1:11 PM Cher Nakai R wrote: Reason for CRM: Left message for patient to call back and schedule the Medicare Annual Wellness Visit (AWV) in office or virtual  Last AWV 07/10/2018  Please schedule at anytime with [place].  40 minute appointment  Any questions, please contact me at [phone]

## 2020-03-23 ENCOUNTER — Telehealth: Payer: Self-pay

## 2020-03-23 NOTE — Telephone Encounter (Signed)
Contacted patient for lung CT screening clinic based on referral from Dr. Ancil Boozer.  Message left for patient to call Burgess Estelle, lung navigator to schedule CT scan.

## 2020-03-26 ENCOUNTER — Telehealth: Payer: Self-pay

## 2020-03-26 NOTE — Telephone Encounter (Signed)
Contacted patient for lung screening CT scan based on referral from Dr. Ancil Boozer.  Message left for patient to call Burgess Estelle, lung navigator to schedule CT scan.

## 2020-03-29 DIAGNOSIS — J449 Chronic obstructive pulmonary disease, unspecified: Secondary | ICD-10-CM | POA: Diagnosis not present

## 2020-04-14 ENCOUNTER — Telehealth: Payer: Self-pay

## 2020-04-14 NOTE — Telephone Encounter (Signed)
She called today and said she runs out of medicine today. She has an appt on 12/13 for a procedure. She is going to call the pharmacy to make sure and then call us back. ..............Marland Kitchen

## 2020-04-20 ENCOUNTER — Ambulatory Visit: Payer: Medicare Other | Admitting: Anesthesiology

## 2020-04-21 ENCOUNTER — Ambulatory Visit: Payer: Medicare Other | Admitting: Anesthesiology

## 2020-04-28 DIAGNOSIS — J449 Chronic obstructive pulmonary disease, unspecified: Secondary | ICD-10-CM | POA: Diagnosis not present

## 2020-05-07 ENCOUNTER — Telehealth: Payer: Self-pay | Admitting: Family Medicine

## 2020-05-07 NOTE — Telephone Encounter (Signed)
Copied from CRM [id number]. Topic: Medicare AWV >> May 07, 2020  2:41 PM Geraldine Contras wrote: Left message for patient to call back and schedule Medicare Annual Wellness Visit (AWV) in office.   If not able to come in office, please offer to do virtually.   Last AWV 07/10/2018  Please schedule at anytime with [facility] Health Advisor.  40 minute appointment  Any questions, please contact me at [phone]

## 2020-05-12 ENCOUNTER — Telehealth: Payer: Self-pay | Admitting: Anesthesiology

## 2020-05-12 NOTE — Telephone Encounter (Signed)
Patient called stating she is out of pain meds due to dental pain from dry socket, she took extra meds. she wanted to know if she can get meds sent in and filled 2 days early.

## 2020-05-12 NOTE — Telephone Encounter (Signed)
Called patient and she states she took extra medications for mouth issues. Informed her That Dr Pernell Dupre would not write her prescription for extra medications. Patient has appt Thurdsay with Dr Madelaine Bhat. Informed patient that we had a form that she can get to give to her Dentist  on Thursday.

## 2020-05-14 ENCOUNTER — Ambulatory Visit
Admission: RE | Admit: 2020-05-14 | Discharge: 2020-05-14 | Disposition: A | Discharge status: Home / Self Care | Payer: Medicare Other | Source: Ambulatory Visit | Attending: Anesthesiology | Admitting: Anesthesiology

## 2020-05-14 ENCOUNTER — Encounter: Payer: Self-pay | Admitting: Anesthesiology

## 2020-05-14 ENCOUNTER — Ambulatory Visit (HOSPITAL_BASED_OUTPATIENT_CLINIC_OR_DEPARTMENT_OTHER): Payer: Medicare Other | Admitting: Anesthesiology

## 2020-05-14 ENCOUNTER — Other Ambulatory Visit: Payer: Self-pay | Admitting: Anesthesiology

## 2020-05-14 ENCOUNTER — Other Ambulatory Visit: Payer: Self-pay

## 2020-05-14 VITALS — BP 149/91 | HR 80 | Temp 97.5°F | Resp 22 | Ht 64.0 in | Wt 240.0 lb

## 2020-05-14 DIAGNOSIS — M25511 Pain in right shoulder: Secondary | ICD-10-CM | POA: Diagnosis not present

## 2020-05-14 DIAGNOSIS — R52 Pain, unspecified: Secondary | ICD-10-CM

## 2020-05-14 DIAGNOSIS — M5386 Other specified dorsopathies, lumbar region: Secondary | ICD-10-CM | POA: Diagnosis not present

## 2020-05-14 DIAGNOSIS — F1190 Opioid use, unspecified, uncomplicated: Secondary | ICD-10-CM | POA: Insufficient documentation

## 2020-05-14 DIAGNOSIS — M5136 Other intervertebral disc degeneration, lumbar region: Secondary | ICD-10-CM

## 2020-05-14 DIAGNOSIS — M542 Cervicalgia: Secondary | ICD-10-CM | POA: Insufficient documentation

## 2020-05-14 DIAGNOSIS — G894 Chronic pain syndrome: Secondary | ICD-10-CM

## 2020-05-14 DIAGNOSIS — M25551 Pain in right hip: Secondary | ICD-10-CM | POA: Insufficient documentation

## 2020-05-14 DIAGNOSIS — M5431 Sciatica, right side: Secondary | ICD-10-CM

## 2020-05-14 DIAGNOSIS — M25552 Pain in left hip: Secondary | ICD-10-CM | POA: Insufficient documentation

## 2020-05-14 DIAGNOSIS — M545 Low back pain, unspecified: Secondary | ICD-10-CM

## 2020-05-14 DIAGNOSIS — M47816 Spondylosis without myelopathy or radiculopathy, lumbar region: Secondary | ICD-10-CM

## 2020-05-14 MED ORDER — NALOXONE HCL 4 MG/0.1ML NA LIQD: NASAL | 1 refills | Status: DC

## 2020-05-14 MED ORDER — LIDOCAINE HCL (PF) 1 % IJ SOLN
5.0000 mL | Freq: Once | INTRAMUSCULAR | Status: AC
  Administered 2020-05-14: 5 mL via SUBCUTANEOUS
  Filled 2020-05-14: qty 5

## 2020-05-14 MED ORDER — IOHEXOL 180 MG/ML  SOLN
10.0000 mL | Freq: Once | INTRAMUSCULAR | Status: AC | PRN
  Administered 2020-05-14: 10 mL via EPIDURAL
  Filled 2020-05-14: qty 20

## 2020-05-14 MED ORDER — OXYCODONE HCL 10 MG PO TABS: 10.0000 mg | ORAL_TABLET | Freq: Four times a day (QID) | ORAL | 0 refills | Status: DC

## 2020-05-14 MED ORDER — SODIUM CHLORIDE 0.9% FLUSH
10.0000 mL | Freq: Once | INTRAVENOUS | Status: AC
  Administered 2020-05-14: 10 mL

## 2020-05-14 MED ORDER — ROPIVACAINE HCL 2 MG/ML IJ SOLN
10.0000 mL | Freq: Once | INTRAMUSCULAR | Status: AC
  Administered 2020-05-14: 10 mL via EPIDURAL
  Filled 2020-05-14: qty 10

## 2020-05-14 MED ORDER — TRIAMCINOLONE ACETONIDE 40 MG/ML IJ SUSP
40.0000 mg | Freq: Once | INTRAMUSCULAR | Status: AC
  Administered 2020-05-14: 40 mg
  Filled 2020-05-14: qty 1

## 2020-05-14 MED ORDER — OXYCODONE HCL 10 MG PO TABS: 10.0000 mg | ORAL_TABLET | Freq: Every day | ORAL | 0 refills | Status: AC | PRN

## 2020-05-14 NOTE — Progress Notes (Signed)
Nursing Pain Medication Assessment:  Safety precautions to be maintained throughout the outpatient stay will include: orient to surroundings, keep bed in low position, maintain call bell within reach at all times, provide assistance with transfer out of bed and ambulation.  Medication Inspection Compliance: Pill count conducted under aseptic conditions, in front of the patient. Neither the pills nor the bottle was removed from the patient's sight at any time. Once count was completed pills were immediately returned to the patient in their original bottle.  Medication: Oxycodone IR Pill/Patch Count: 0 of 120 pills remain Pill/Patch Appearance: Markings consistent with prescribed medication Bottle Appearance: Standard pharmacy container. Clearly labeled. Filled Date: 04/15/2020 Last Medication intake:  Today

## 2020-05-14 NOTE — Progress Notes (Signed)
Subjective:  Patient ID: Wendy Johnson, female    DOB: 06-20-1965  Age: 55 y.o. MRN: [id number]  CC: Back Pain (lower)   Procedure: L5-S1 lumbar epidural steroid under fluoroscopic guidance with no sedation  HPI Wendy Johnson presents for reevaluation.  She is complaining of severe low back pain with radiation into the right greater than left hip and buttock region and right calf.  This is worse with prolonged standing and ambulation.  In the past she has been able to manage this with medication and stretching exercises keeping this under reasonable control however this is recently been more severe over the past month and a half.  In the past she has had epidural steroid injections successfully giving her 70 to 80% relief lasting 6 to 8 weeks before recurrence of a similar pain.  No change in lower extremity strength or function is noted.  The pain is worse with standing certain rotational motion and climbing stairs.  She is taking medications as prescribed and these are working well for her.  No diverting or illicit use is noted and she gets good relief with no side effects reported with her opioid medications.  She does report that she recently had some dental work back in November December and is still having considerable tooth pain.  She is 1 day short on her medication.  She is scheduled to have some more dental surgery in the next few weeks.  Outpatient Medications Prior to Visit  Medication Sig Dispense Refill  . albuterol (PROVENTIL) (2.5 MG/3ML) 0.083% nebulizer solution Take 3 mLs (2.5 mg total) by nebulization every 6 (six) hours as needed for wheezing or shortness of breath. 75 mL 1  . amphetamine-dextroamphetamine (ADDERALL) 15 MG tablet Take 15 mg by mouth daily.     Marland Kitchen amphetamine-dextroamphetamine (ADDERALL) 30 MG tablet Take 30 mg by mouth 2 (two) times daily.    Marland Kitchen atorvastatin (LIPITOR) 40 MG tablet Take 1 tablet (40 mg total) by mouth daily. 90 tablet 1  . busPIRone (BUSPAR)  30 MG tablet Take 30 mg by mouth 2 (two) times daily.     . clonazePAM (KLONOPIN) 1 MG tablet Take 1 mg by mouth 6 (six) times daily.     . cyclobenzaprine (FLEXERIL) 10 MG tablet Take 10 mg by mouth 3 (three) times daily.    Marland Kitchen dexlansoprazole (DEXILANT) 60 MG capsule Take 1 capsule (60 mg total) by mouth daily. 90 capsule 1  . ibuprofen (ADVIL) 800 MG tablet Take 1 tablet (800 mg total) by mouth every 8 (eight) hours as needed. 90 tablet 0  . LATUDA 80 MG TABS tablet Take 80 mg by mouth at bedtime.    . naloxone (NARCAN) nasal spray 4 mg/0.1 mL As directed for opioid induced respiratory depression 1 each 1  . omega-3 acid ethyl esters (LOVAZA) 1 g capsule Take 2 capsules (2 g total) by mouth 2 (two) times daily. 360 capsule 1  . PROAIR HFA 108 (90 Base) MCG/ACT inhaler INHALE 2 PUFFS BY MOUTH 4 TIMES A DAY 8.5 g 0  . sertraline (ZOLOFT) 100 MG tablet Take 100 mg by mouth 2 (two) times daily.    Marland Kitchen tiotropium (SPIRIVA HANDIHALER) 18 MCG inhalation capsule Place 1 capsule (18 mcg total) into inhaler and inhale daily. 30 capsule 5  . traZODone (DESYREL) 100 MG tablet Take 400 mg by mouth at bedtime.     . valsartan (DIOVAN) 80 MG tablet Take 1 tablet (80 mg total) by mouth daily. [place]  tablet 1  . Oxycodone HCl 10 MG TABS Take 1 tablet (10 mg total) by mouth in the morning, at noon, in the evening, and at bedtime. 120 tablet 0  . gabapentin (NEURONTIN) 300 MG capsule Take by mouth. (Patient not taking: Reported on 05/14/2020)     No facility-administered medications prior to visit.    Review of Systems CNS: No confusion or sedation Cardiac: No angina or palpitations GI: No abdominal pain or constipation Constitutional: No nausea vomiting fevers or chills  Objective:  BP (!) 149/91   Pulse 80   Temp (!) 97.5 F (36.4 C) (Temporal)   Resp (!) 22   Ht 5\' 4"  (1.626 m)   Wt 240 lb (108.9 kg)   LMP 09/26/2014 Comment: Total  SpO2 96%   BMI 41.20 kg/m    BP Readings from Last 3 Encounters:   05/14/20 (!) 149/91  03/19/20 (!) 136/92  01/15/20 (!) 132/96     Wt Readings from Last 3 Encounters:  05/14/20 240 lb (108.9 kg)  03/19/20 241 lb 14.4 oz (109.7 kg)  01/15/20 245 lb (111.1 kg)     Physical Exam Pt is alert and oriented PERRL EOMI HEART IS RRR no murmur or rub LCTA no wheezing or rales MUSCULOSKELETAL reveals some paraspinous muscle tenderness in the lumbar region.  No overt trigger points are noted she is ambulating with an antalgic gait.  She does seated to standing with mild difficulty.  Her muscle tone and bulk is at baseline.  She does have a positive straight leg raise on the right side negative on the left.  Labs  Lab Results  Component Value Date   HGBA1C 5.3 06/04/2019   HGBA1C 5.3 10/04/2018   HGBA1C 5.5 01/22/2018   Lab Results  Component Value Date   MICROALBUR 0.2 06/04/2019   LDLCALC 106 (H) 06/04/2019   CREATININE 0.50 11/13/2019    -------------------------------------------------------------------------------------------------------------------- Lab Results  Component Value Date   WBC 10.6 06/04/2019   HGB 15.3 06/04/2019   HCT 46.9 (H) 06/04/2019   PLT 291 06/04/2019   GLUCOSE 92 06/04/2019   CHOL 175 06/04/2019   TRIG 194 (H) 06/04/2019   HDL 38 (L) 06/04/2019   LDLCALC 106 (H) 06/04/2019   ALT 11 06/04/2019   AST 10 06/04/2019   NA 139 06/04/2019   K 4.4 06/04/2019   CL 104 06/04/2019   CREATININE 0.50 11/13/2019   BUN 8 06/04/2019   CO2 26 06/04/2019   TSH 0.769 02/03/2016   INR 0.97 10/04/2016   HGBA1C 5.3 06/04/2019   MICROALBUR 0.2 06/04/2019    --------------------------------------------------------------------------------------------------------------------- DG PAIN CLINIC C-ARM 1-60 MIN NO REPORT  Result Date: 05/14/2020 Fluoro was used, but no Radiologist interpretation will be provided. Please refer to "NOTES" tab for provider progress note.    Assessment & Plan:   Arushi was seen today for back  pain.  Diagnoses and all orders for this visit:  DDD (degenerative disc disease), lumbar -     Lumbar Epidural Injection; Future  Low back pain at multiple sites  Chronic, continuous use of opioids  Sciatica, right side -     Lumbar Epidural Injection; Future  Chronic pain syndrome  Pain of both hip joints -     Lumbar Epidural Injection; Future  Cervicalgia  Facet arthritis of lumbar region  Pain in joint of right shoulder  Low back derangement syndrome  Other orders -     triamcinolone acetonide (KENALOG-40) injection 40 mg -     sodium  chloride flush (NS) 0.9 % injection 10 mL -     ropivacaine (PF) 2 mg/mL (0.2%) (NAROPIN) injection 10 mL -     lidocaine (PF) (XYLOCAINE) 1 % injection 5 mL -     iohexol (OMNIPAQUE) 180 MG/ML injection 10 mL -     Oxycodone HCl 10 MG TABS; Take 1 tablet (10 mg total) by mouth 5 (five) times daily as needed. -     Oxycodone HCl 10 MG TABS; Take 1 tablet (10 mg total) by mouth in the morning, at noon, in the evening, and at bedtime.        ----------------------------------------------------------------------------------------------------------------------  Problem List Items Addressed This Visit      Unprioritized   DDD (degenerative disc disease), lumbar - Primary   Relevant Medications   Oxycodone HCl 10 MG TABS   Oxycodone HCl 10 MG TABS (Start on 06/13/2020)   Other Relevant Orders   Lumbar Epidural Injection   Facet arthritis of lumbar region   Relevant Medications   Oxycodone HCl 10 MG TABS   Oxycodone HCl 10 MG TABS (Start on 06/13/2020)   Low back derangement syndrome   Relevant Medications   Oxycodone HCl 10 MG TABS   Oxycodone HCl 10 MG TABS (Start on 06/13/2020)   Pain in joint, shoulder region   Sciatica, right side   Relevant Orders   Lumbar Epidural Injection    Other Visit Diagnoses    Low back pain at multiple sites       Relevant Medications   triamcinolone acetonide (KENALOG-40) injection 40 mg  (Completed)   Oxycodone HCl 10 MG TABS   Oxycodone HCl 10 MG TABS (Start on 06/13/2020)   Chronic, continuous use of opioids       Chronic pain syndrome       Relevant Medications   triamcinolone acetonide (KENALOG-40) injection 40 mg (Completed)   ropivacaine (PF) 2 mg/mL (0.2%) (NAROPIN) injection 10 mL (Completed)   lidocaine (PF) (XYLOCAINE) 1 % injection 5 mL (Completed)   Oxycodone HCl 10 MG TABS   Oxycodone HCl 10 MG TABS (Start on 06/13/2020)   Pain of both hip joints       Relevant Orders   Lumbar Epidural Injection   Cervicalgia            ----------------------------------------------------------------------------------------------------------------------  1. DDD (degenerative disc disease), lumbar We will proceed with a repeat lumbar epidural today.  The risks and benefits were once again reviewed all questions answered the procedure explained.  We will schedule her for return to clinic in 1 month possible repeat epidural at that time.  No other change will be made in her pain management protocol.  Continue core stretching.  She has lost 18 pounds and I have encouraged her to continue at efforts of weight loss. - Lumbar Epidural Injection; Future  2. Low back pain at multiple sites Continue stretching strengthening  3. Chronic, continuous use of opioids I have reviewed the [facility] practitioner database information and it is appropriate for medication refills.  This will be dated for the next 2 months.  She has an outstanding January prescription and we will fill for a February prescription as well.  For this next month prescription I am going to allow her an additional 10 tablets for 1 extra dose at 5 mg for assistance with acute pain for dental surgery.  We talked about the risks of additional opioid medications and she understands these symptoms and has Narcan available.  4. Sciatica, right side As  above - Lumbar Epidural Injection; Future  5. Chronic pain  syndrome As above  6. Pain of both hip joints As above - Lumbar Epidural Injection; Future  7. Cervicalgia Continues to do neck exercises as previously reviewed  8. Facet arthritis of lumbar region   9. Pain in joint of right shoulder   10. Low back derangement syndrome     ----------------------------------------------------------------------------------------------------------------------  I have changed Megan Mans. Mcneff "[name]"'s Oxycodone HCl. I am also having her start on Oxycodone HCl. Additionally, I am having her maintain her amphetamine-dextroamphetamine, busPIRone, sertraline, clonazePAM, traZODone, albuterol, amphetamine-dextroamphetamine, gabapentin, cyclobenzaprine, Latuda, ProAir HFA, naloxone, atorvastatin, Dexilant, omega-3 acid ethyl esters, Spiriva HandiHaler, valsartan, and ibuprofen. We administered triamcinolone acetonide, sodium chloride flush, ropivacaine (PF) 2 mg/mL (0.2%), lidocaine (PF), and iohexol.   Meds ordered this encounter  Medications  . triamcinolone acetonide (KENALOG-40) injection 40 mg  . sodium chloride flush (NS) 0.9 % injection 10 mL  . ropivacaine (PF) 2 mg/mL (0.2%) (NAROPIN) injection 10 mL  . lidocaine (PF) (XYLOCAINE) 1 % injection 5 mL  . iohexol (OMNIPAQUE) 180 MG/ML injection 10 mL  . Oxycodone HCl 10 MG TABS    Sig: Take 1 tablet (10 mg total) by mouth 5 (five) times daily as needed.    Dispense:  130 tablet    Refill:  0  . Oxycodone HCl 10 MG TABS    Sig: Take 1 tablet (10 mg total) by mouth in the morning, at noon, in the evening, and at bedtime.    Dispense:  120 tablet    Refill:  0   Patient's Medications  New Prescriptions   OXYCODONE HCL 10 MG TABS    Take 1 tablet (10 mg total) by mouth in the morning, at noon, in the evening, and at bedtime.  Previous Medications   ALBUTEROL (PROVENTIL) (2.5 MG/3ML) 0.083% NEBULIZER SOLUTION    Take 3 mLs (2.5 mg total) by nebulization every 6 (six) hours as needed for wheezing  or shortness of breath.   AMPHETAMINE-DEXTROAMPHETAMINE (ADDERALL) 15 MG TABLET    Take 15 mg by mouth daily.    AMPHETAMINE-DEXTROAMPHETAMINE (ADDERALL) 30 MG TABLET    Take 30 mg by mouth 2 (two) times daily.   ATORVASTATIN (LIPITOR) 40 MG TABLET    Take 1 tablet (40 mg total) by mouth daily.   BUSPIRONE (BUSPAR) 30 MG TABLET    Take 30 mg by mouth 2 (two) times daily.    CLONAZEPAM (KLONOPIN) 1 MG TABLET    Take 1 mg by mouth 6 (six) times daily.    CYCLOBENZAPRINE (FLEXERIL) 10 MG TABLET    Take 10 mg by mouth 3 (three) times daily.   DEXLANSOPRAZOLE (DEXILANT) 60 MG CAPSULE    Take 1 capsule (60 mg total) by mouth daily.   GABAPENTIN (NEURONTIN) 300 MG CAPSULE    Take by mouth.   IBUPROFEN (ADVIL) 800 MG TABLET    Take 1 tablet (800 mg total) by mouth every 8 (eight) hours as needed.   LATUDA 80 MG TABS TABLET    Take 80 mg by mouth at bedtime.   NALOXONE (NARCAN) NASAL SPRAY 4 MG/0.1 ML    As directed for opioid induced respiratory depression   OMEGA-3 ACID ETHYL ESTERS (LOVAZA) 1 G CAPSULE    Take 2 capsules (2 g total) by mouth 2 (two) times daily.   PROAIR HFA 108 (90 BASE) MCG/ACT INHALER    INHALE 2 PUFFS BY MOUTH 4 TIMES A DAY   SERTRALINE (ZOLOFT) 100  MG TABLET    Take 100 mg by mouth 2 (two) times daily.   TIOTROPIUM (SPIRIVA HANDIHALER) 18 MCG INHALATION CAPSULE    Place 1 capsule (18 mcg total) into inhaler and inhale daily.   TRAZODONE (DESYREL) 100 MG TABLET    Take 400 mg by mouth at bedtime.    VALSARTAN (DIOVAN) 80 MG TABLET    Take 1 tablet (80 mg total) by mouth daily.  Modified Medications   Modified Medication Previous Medication   OXYCODONE HCL 10 MG TABS Oxycodone HCl 10 MG TABS      Take 1 tablet (10 mg total) by mouth 5 (five) times daily as needed.    Take 1 tablet (10 mg total) by mouth in the morning, at noon, in the evening, and at bedtime.  Discontinued Medications   No medications on file    ----------------------------------------------------------------------------------------------------------------------  Follow-up: Return in about 1 month (around 06/14/2020) for procedure, evaluation.   Procedure: L5-S1 LESI with fluoroscopic guidance and no moderate sedation  NOTE: The risks, benefits, and expectations of the procedure have been discussed and explained to the patient who was understanding and in agreement with suggested treatment plan. No guarantees were made.  DESCRIPTION OF PROCEDURE: Lumbar epidural steroid injection with no IV Versed, EKG, blood pressure, pulse, and pulse oximetry monitoring. The procedure was performed with the patient in the prone position under fluoroscopic guidance.  Sterile prep x3 was initiated and I then injected subcutaneous lidocaine to the overlying L5-S1 site after its fluoroscopic identifictation.  Using strict aseptic technique, I then advanced an 18-gauge Tuohy epidural needle in the midline using interlaminar approach via loss-of-resistance to saline technique. There was negative aspiration for heme or  CSF.  I then confirmed position with both AP and Lateral fluoroscan.  2 cc of contrast dye were injected and a  total of 5 mL of Preservative-Free normal saline mixed with 40 mg of Kenalog and 1cc Ropicaine 0.2 percent were injected incrementally via the  epidurally placed needle. The needle was removed. The patient tolerated the injection well and was convalesced and discharged to home in stable condition. Should the patient have any post procedure difficulty they have been instructed on how to contact us for assistance.    Molli Barrows, MD

## 2020-05-15 ENCOUNTER — Telehealth: Payer: Self-pay | Admitting: *Deleted

## 2020-05-15 NOTE — Telephone Encounter (Signed)
No problems post procedure. 

## 2020-06-15 ENCOUNTER — Ambulatory Visit: Payer: Medicare Other | Admitting: Anesthesiology

## 2020-06-24 DIAGNOSIS — M7061 Trochanteric bursitis, right hip: Secondary | ICD-10-CM | POA: Diagnosis not present

## 2020-06-24 DIAGNOSIS — M25551 Pain in right hip: Secondary | ICD-10-CM | POA: Diagnosis not present

## 2020-06-29 ENCOUNTER — Ambulatory Visit: Payer: Medicare Other | Admitting: Family Medicine

## 2020-07-09 DIAGNOSIS — M7061 Trochanteric bursitis, right hip: Secondary | ICD-10-CM | POA: Diagnosis not present

## 2020-07-09 DIAGNOSIS — M25551 Pain in right hip: Secondary | ICD-10-CM | POA: Diagnosis not present

## 2020-07-09 DIAGNOSIS — M6281 Muscle weakness (generalized): Secondary | ICD-10-CM | POA: Diagnosis not present

## 2020-07-13 ENCOUNTER — Other Ambulatory Visit: Payer: Self-pay | Admitting: Anesthesiology

## 2020-07-13 ENCOUNTER — Other Ambulatory Visit: Payer: Self-pay

## 2020-07-13 ENCOUNTER — Encounter: Payer: Self-pay | Admitting: Anesthesiology

## 2020-07-13 ENCOUNTER — Ambulatory Visit
Admission: RE | Admit: 2020-07-13 | Discharge: 2020-07-13 | Disposition: A | Discharge status: Home / Self Care | Payer: Medicare Other | Source: Ambulatory Visit | Attending: Anesthesiology | Admitting: Anesthesiology

## 2020-07-13 ENCOUNTER — Ambulatory Visit (HOSPITAL_BASED_OUTPATIENT_CLINIC_OR_DEPARTMENT_OTHER): Payer: Medicare Other | Admitting: Anesthesiology

## 2020-07-13 VITALS — BP 167/98 | HR 66 | Temp 97.3°F | Resp 18 | Ht 64.0 in | Wt 240.0 lb

## 2020-07-13 DIAGNOSIS — M5136 Other intervertebral disc degeneration, lumbar region: Secondary | ICD-10-CM

## 2020-07-13 DIAGNOSIS — M25551 Pain in right hip: Secondary | ICD-10-CM | POA: Diagnosis not present

## 2020-07-13 DIAGNOSIS — F1190 Opioid use, unspecified, uncomplicated: Secondary | ICD-10-CM

## 2020-07-13 DIAGNOSIS — M5431 Sciatica, right side: Secondary | ICD-10-CM

## 2020-07-13 DIAGNOSIS — R52 Pain, unspecified: Secondary | ICD-10-CM | POA: Insufficient documentation

## 2020-07-13 DIAGNOSIS — M5386 Other specified dorsopathies, lumbar region: Secondary | ICD-10-CM | POA: Diagnosis not present

## 2020-07-13 DIAGNOSIS — M542 Cervicalgia: Secondary | ICD-10-CM

## 2020-07-13 DIAGNOSIS — M47816 Spondylosis without myelopathy or radiculopathy, lumbar region: Secondary | ICD-10-CM | POA: Insufficient documentation

## 2020-07-13 DIAGNOSIS — G894 Chronic pain syndrome: Secondary | ICD-10-CM

## 2020-07-13 DIAGNOSIS — M545 Low back pain, unspecified: Secondary | ICD-10-CM

## 2020-07-13 DIAGNOSIS — M25552 Pain in left hip: Secondary | ICD-10-CM | POA: Insufficient documentation

## 2020-07-13 MED ORDER — IOHEXOL 180 MG/ML  SOLN
10.0000 mL | Freq: Once | INTRAMUSCULAR | Status: AC | PRN
  Administered 2020-07-13: 10 mL via EPIDURAL

## 2020-07-13 MED ORDER — IOHEXOL 180 MG/ML  SOLN
INTRAMUSCULAR | Status: AC
  Filled 2020-07-13: qty 20

## 2020-07-13 MED ORDER — LIDOCAINE HCL (PF) 1 % IJ SOLN
5.0000 mL | Freq: Once | INTRAMUSCULAR | Status: AC
  Administered 2020-07-13: 5 mL via SUBCUTANEOUS

## 2020-07-13 MED ORDER — OXYCODONE HCL 10 MG PO TABS: 10.0000 mg | ORAL_TABLET | ORAL | 0 refills | Status: AC

## 2020-07-13 MED ORDER — ROPIVACAINE HCL 2 MG/ML IJ SOLN
INTRAMUSCULAR | Status: AC
  Filled 2020-07-13: qty 10

## 2020-07-13 MED ORDER — TRIAMCINOLONE ACETONIDE 40 MG/ML IJ SUSP
40.0000 mg | Freq: Once | INTRAMUSCULAR | Status: AC
  Administered 2020-07-13: 40 mg

## 2020-07-13 MED ORDER — SODIUM CHLORIDE (PF) 0.9 % IJ SOLN
INTRAMUSCULAR | Status: AC
  Filled 2020-07-13: qty 10

## 2020-07-13 MED ORDER — LIDOCAINE HCL (PF) 1 % IJ SOLN
INTRAMUSCULAR | Status: AC
  Filled 2020-07-13: qty 5

## 2020-07-13 MED ORDER — TRIAMCINOLONE ACETONIDE 40 MG/ML IJ SUSP
INTRAMUSCULAR | Status: AC
  Filled 2020-07-13: qty 1

## 2020-07-13 MED ORDER — ROPIVACAINE HCL 2 MG/ML IJ SOLN
10.0000 mL | Freq: Once | INTRAMUSCULAR | Status: AC
  Administered 2020-07-13: 10 mL via EPIDURAL

## 2020-07-13 MED ORDER — SODIUM CHLORIDE 0.9% FLUSH
10.0000 mL | Freq: Once | INTRAVENOUS | Status: AC
  Administered 2020-07-13: 10 mL

## 2020-07-13 NOTE — Progress Notes (Signed)
Safety precautions to be maintained throughout the outpatient stay will include: orient to surroundings, keep bed in low position, maintain call bell within reach at all times, provide assistance with transfer out of bed and ambulation.  

## 2020-07-13 NOTE — Progress Notes (Signed)
Subjective:  Patient ID: Wendy Johnson, female    DOB: 12/09/1965  Age: 55 y.o. MRN: [id number]  CC: Back Pain (Lumbar bilateral ) and Hip Pain (Right )   Procedure: L5-S1 epidural steroid under fluoroscopic guidance with no sedation  HPI Wendy Johnson presents for reevaluation.  She continues to have sciatica-like symptoms but has responded to previous epidural steroid injections.  She desires to proceed with a repeat epidural for her right posterior lateral leg pain.  She still getting some calf cramping but it seems to be better than her last visit.  She reports approximately 50 to 75% relief lasting about 4 to 6 weeks before she gets recurrence of the same pain.  She still having considerable hip pain and is presently scheduled for a possible revision of her right hip with Dr. Rudene Christians.  Otherwise she continues to have stable low back pain and diffuse body pain and continues to take her opioids as prescribed and these continue to give her approximately 75% relief when she takes them.  Unfortunately she is only getting approximately 3 to 4 hours before she has recurrence of the same quality pain and this has been worse lately secondary to the increasing hip pain she is experiencing.  Otherwise no untoward side effects are reported regarding her opioids and she describes good functional benefit and improved daily functioning with her medications.  Outpatient Medications Prior to Visit  Medication Sig Dispense Refill  . albuterol (PROVENTIL) (2.5 MG/3ML) 0.083% nebulizer solution Take 3 mLs (2.5 mg total) by nebulization every 6 (six) hours as needed for wheezing or shortness of breath. 75 mL 1  . amphetamine-dextroamphetamine (ADDERALL) 15 MG tablet Take 15 mg by mouth daily.     Marland Kitchen amphetamine-dextroamphetamine (ADDERALL) 30 MG tablet Take 30 mg by mouth 2 (two) times daily.    . busPIRone (BUSPAR) 30 MG tablet Take 30 mg by mouth 2 (two) times daily.     . clonazePAM (KLONOPIN) 1 MG tablet Take  1 mg by mouth 6 (six) times daily.     . cyclobenzaprine (FLEXERIL) 10 MG tablet Take 10 mg by mouth 3 (three) times daily.    Marland Kitchen dexlansoprazole (DEXILANT) 60 MG capsule Take 1 capsule (60 mg total) by mouth daily. 90 capsule 1  . hydrOXYzine (ATARAX/VISTARIL) 50 MG tablet Take 50 mg by mouth as needed.    Marland Kitchen ibuprofen (ADVIL) 800 MG tablet Take 1 tablet (800 mg total) by mouth every 8 (eight) hours as needed. 90 tablet 0  . LATUDA 80 MG TABS tablet Take 80 mg by mouth at bedtime.    . naloxone (NARCAN) nasal spray 4 mg/0.1 mL As directed for opioid induced respiratory depression 1 each 1  . omega-3 acid ethyl esters (LOVAZA) 1 g capsule Take 2 capsules (2 g total) by mouth 2 (two) times daily. 360 capsule 1  . PROAIR HFA 108 (90 Base) MCG/ACT inhaler INHALE 2 PUFFS BY MOUTH 4 TIMES A DAY 8.5 g 0  . sertraline (ZOLOFT) 100 MG tablet Take 100 mg by mouth 2 (two) times daily.    Marland Kitchen tiotropium (SPIRIVA HANDIHALER) 18 MCG inhalation capsule Place 1 capsule (18 mcg total) into inhaler and inhale daily. 30 capsule 5  . traZODone (DESYREL) 100 MG tablet Take 400 mg by mouth at bedtime.     . Oxycodone HCl 10 MG TABS Take 1 tablet (10 mg total) by mouth in the morning, at noon, in the evening, and at bedtime. 120 tablet 0  .  atorvastatin (LIPITOR) 40 MG tablet Take 1 tablet (40 mg total) by mouth daily. (Patient not taking: Reported on 07/13/2020) 90 tablet 1  . gabapentin (NEURONTIN) 300 MG capsule Take by mouth. (Patient not taking: No sig reported)    . naloxone (NARCAN) nasal spray 4 mg/0.1 mL As directed for opioid induced respiratory depression (Patient not taking: Reported on 07/13/2020) 1 each 1  . valsartan (DIOVAN) 80 MG tablet Take 1 tablet (80 mg total) by mouth daily. (Patient not taking: Reported on 07/13/2020) 90 tablet 1   No facility-administered medications prior to visit.    Review of Systems CNS: No confusion or sedation Cardiac: No angina or palpitations GI: No abdominal pain or  constipation Constitutional: No nausea vomiting fevers or chills  Objective:  BP (!) 167/98   Pulse 66   Temp (!) 97.3 F (36.3 C) (Temporal)   Resp 18   Ht 5\' 4"  (1.626 m)   Wt 240 lb (108.9 kg)   LMP 09/26/2014 Comment: Total  SpO2 96%   BMI 41.20 kg/m    BP Readings from Last 3 Encounters:  07/13/20 (!) 167/98  05/14/20 (!) 149/91  03/19/20 (!) 136/92     Wt Readings from Last 3 Encounters:  07/13/20 240 lb (108.9 kg)  05/14/20 240 lb (108.9 kg)  03/19/20 241 lb 14.4 oz (109.7 kg)     Physical Exam Pt is alert and oriented PERRL EOMI HEART IS RRR no murmur or rub LCTA no wheezing or rales MUSCULOSKELETAL reveals some paraspinous muscle tenderness but no overt trigger points.  She continues to have a positive straight leg raise on the right negative on the left with painful range of motion to right hip.  She walks with an antalgic gait and muscle tone and bulk is at baseline  Labs  Lab Results  Component Value Date   HGBA1C 5.3 06/04/2019   HGBA1C 5.3 10/04/2018   HGBA1C 5.5 01/22/2018   Lab Results  Component Value Date   MICROALBUR 0.2 06/04/2019   LDLCALC 106 (H) 06/04/2019   CREATININE 0.50 11/13/2019    -------------------------------------------------------------------------------------------------------------------- Lab Results  Component Value Date   WBC 10.6 06/04/2019   HGB 15.3 06/04/2019   HCT 46.9 (H) 06/04/2019   PLT 291 06/04/2019   GLUCOSE 92 06/04/2019   CHOL 175 06/04/2019   TRIG 194 (H) 06/04/2019   HDL 38 (L) 06/04/2019   LDLCALC 106 (H) 06/04/2019   ALT 11 06/04/2019   AST 10 06/04/2019   NA 139 06/04/2019   K 4.4 06/04/2019   CL 104 06/04/2019   CREATININE 0.50 11/13/2019   BUN 8 06/04/2019   CO2 26 06/04/2019   TSH 0.769 02/03/2016   INR 0.97 10/04/2016   HGBA1C 5.3 06/04/2019   MICROALBUR 0.2 06/04/2019     --------------------------------------------------------------------------------------------------------------------- DG PAIN CLINIC C-ARM 1-60 MIN NO REPORT  Result Date: 07/13/2020 Fluoro was used, but no Radiologist interpretation will be provided. Please refer to "NOTES" tab for provider progress note.    Assessment & Plan:   Wendy Johnson was seen today for back pain and hip pain.  Diagnoses and all orders for this visit:  DDD (degenerative disc disease), lumbar -     Lumbar Epidural Injection; Future  Low back pain at multiple sites  Chronic, continuous use of opioids  Sciatica, right side -     Lumbar Epidural Injection; Future  Chronic pain syndrome  Pain of both hip joints  Cervicalgia  Facet arthritis of lumbar region  Low back derangement syndrome -  Lumbar Epidural Injection; Future  Other orders -     Oxycodone HCl 10 MG TABS; Take 1 tablet (10 mg total) by mouth every 4 (four) hours. -     Oxycodone HCl 10 MG TABS; Take 1 tablet (10 mg total) by mouth every 4 (four) hours. -     triamcinolone acetonide (KENALOG-40) injection 40 mg -     sodium chloride flush (NS) 0.9 % injection 10 mL -     ropivacaine (PF) 2 mg/mL (0.2%) (NAROPIN) injection 10 mL -     lidocaine (PF) (XYLOCAINE) 1 % injection 5 mL -     iohexol (OMNIPAQUE) 180 MG/ML injection 10 mL        ----------------------------------------------------------------------------------------------------------------------  Problem List Items Addressed This Visit      Unprioritized   DDD (degenerative disc disease), lumbar - Primary   Relevant Medications   Oxycodone HCl 10 MG TABS   Oxycodone HCl 10 MG TABS (Start on 08/12/2020)   Other Relevant Orders   Lumbar Epidural Injection   Facet arthritis of lumbar region   Relevant Medications   Oxycodone HCl 10 MG TABS   Oxycodone HCl 10 MG TABS (Start on 08/12/2020)   Low back derangement syndrome   Relevant Medications   Oxycodone HCl 10 MG TABS    Oxycodone HCl 10 MG TABS (Start on 08/12/2020)   Other Relevant Orders   Lumbar Epidural Injection   Sciatica, right side   Relevant Medications   hydrOXYzine (ATARAX/VISTARIL) 50 MG tablet   Other Relevant Orders   Lumbar Epidural Injection    Other Visit Diagnoses    Low back pain at multiple sites       Relevant Medications   Oxycodone HCl 10 MG TABS   Oxycodone HCl 10 MG TABS (Start on 08/12/2020)   triamcinolone acetonide (KENALOG-40) injection 40 mg (Completed)   Chronic, continuous use of opioids       Chronic pain syndrome       Relevant Medications   Oxycodone HCl 10 MG TABS   Oxycodone HCl 10 MG TABS (Start on 08/12/2020)   triamcinolone acetonide (KENALOG-40) injection 40 mg (Completed)   ropivacaine (PF) 2 mg/mL (0.2%) (NAROPIN) injection 10 mL (Completed)   lidocaine (PF) (XYLOCAINE) 1 % injection 5 mL (Completed)   Pain of both hip joints       Cervicalgia            ----------------------------------------------------------------------------------------------------------------------  1. DDD (degenerative disc disease), lumbar We will proceed with a repeat epidural today with schedule return to clinic in 2 months.  We gone over the risks and benefits of the procedure with her in full detail all questions were answered.  Want her to continue efforts at weight loss stretching strengthening as reviewed again today. - Lumbar Epidural Injection; Future  2. Low back pain at multiple sites As above  3. Chronic, continuous use of opioids I have reviewed the [facility] practitioner database information and it is appropriate for refill.  This will be done for March 7 and April 6.  She is scheduled for return to clinic in 2 months for possible repeat epidural at that time.  4. Sciatica, right side As above - Lumbar Epidural Injection; Future  5. Chronic pain syndrome As above  6. Pain of both hip joints Continue follow-up with orthopedics  7. Cervicalgia As  abov  8. Facet arthritis of lumbar region   9. Low back derangement syndrome  - Lumbar Epidural Injection; Future    ----------------------------------------------------------------------------------------------------------------------  I have changed Wendy Johnson. Wendy Johnson "[name]"'s Oxycodone HCl. I am also having her start on Oxycodone HCl. Additionally, I am having her maintain her amphetamine-dextroamphetamine, busPIRone, sertraline, clonazePAM, traZODone, albuterol, amphetamine-dextroamphetamine, gabapentin, cyclobenzaprine, Latuda, ProAir HFA, naloxone, atorvastatin, Dexilant, omega-3 acid ethyl esters, Spiriva HandiHaler, valsartan, ibuprofen, naloxone, and hydrOXYzine. We administered triamcinolone acetonide, sodium chloride flush, ropivacaine (PF) 2 mg/mL (0.2%), lidocaine (PF), and iohexol.   Meds ordered this encounter  Medications  . Oxycodone HCl 10 MG TABS    Sig: Take 1 tablet (10 mg total) by mouth every 4 (four) hours.    Dispense:  150 tablet    Refill:  0  . Oxycodone HCl 10 MG TABS    Sig: Take 1 tablet (10 mg total) by mouth every 4 (four) hours.    Dispense:  150 tablet    Refill:  0  . triamcinolone acetonide (KENALOG-40) injection 40 mg  . sodium chloride flush (NS) 0.9 % injection 10 mL  . ropivacaine (PF) 2 mg/mL (0.2%) (NAROPIN) injection 10 mL  . lidocaine (PF) (XYLOCAINE) 1 % injection 5 mL  . iohexol (OMNIPAQUE) 180 MG/ML injection 10 mL   Patient's Medications  New Prescriptions   OXYCODONE HCL 10 MG TABS    Take 1 tablet (10 mg total) by mouth every 4 (four) hours.  Previous Medications   ALBUTEROL (PROVENTIL) (2.5 MG/3ML) 0.083% NEBULIZER SOLUTION    Take 3 mLs (2.5 mg total) by nebulization every 6 (six) hours as needed for wheezing or shortness of breath.   AMPHETAMINE-DEXTROAMPHETAMINE (ADDERALL) 15 MG TABLET    Take 15 mg by mouth daily.    AMPHETAMINE-DEXTROAMPHETAMINE (ADDERALL) 30 MG TABLET    Take 30 mg by mouth 2 (two) times daily.    ATORVASTATIN (LIPITOR) 40 MG TABLET    Take 1 tablet (40 mg total) by mouth daily.   BUSPIRONE (BUSPAR) 30 MG TABLET    Take 30 mg by mouth 2 (two) times daily.    CLONAZEPAM (KLONOPIN) 1 MG TABLET    Take 1 mg by mouth 6 (six) times daily.    CYCLOBENZAPRINE (FLEXERIL) 10 MG TABLET    Take 10 mg by mouth 3 (three) times daily.   DEXLANSOPRAZOLE (DEXILANT) 60 MG CAPSULE    Take 1 capsule (60 mg total) by mouth daily.   GABAPENTIN (NEURONTIN) 300 MG CAPSULE    Take by mouth.   HYDROXYZINE (ATARAX/VISTARIL) 50 MG TABLET    Take 50 mg by mouth as needed.   IBUPROFEN (ADVIL) 800 MG TABLET    Take 1 tablet (800 mg total) by mouth every 8 (eight) hours as needed.   LATUDA 80 MG TABS TABLET    Take 80 mg by mouth at bedtime.   NALOXONE (NARCAN) NASAL SPRAY 4 MG/0.1 ML    As directed for opioid induced respiratory depression   NALOXONE (NARCAN) NASAL SPRAY 4 MG/0.1 ML    As directed for opioid induced respiratory depression   OMEGA-3 ACID ETHYL ESTERS (LOVAZA) 1 G CAPSULE    Take 2 capsules (2 g total) by mouth 2 (two) times daily.   PROAIR HFA 108 (90 BASE) MCG/ACT INHALER    INHALE 2 PUFFS BY MOUTH 4 TIMES A DAY   SERTRALINE (ZOLOFT) 100 MG TABLET    Take 100 mg by mouth 2 (two) times daily.   TIOTROPIUM (SPIRIVA HANDIHALER) 18 MCG INHALATION CAPSULE    Place 1 capsule (18 mcg total) into inhaler and inhale daily.   TRAZODONE (DESYREL) 100 MG TABLET  Take 400 mg by mouth at bedtime.    VALSARTAN (DIOVAN) 80 MG TABLET    Take 1 tablet (80 mg total) by mouth daily.  Modified Medications   Modified Medication Previous Medication   OXYCODONE HCL 10 MG TABS Oxycodone HCl 10 MG TABS      Take 1 tablet (10 mg total) by mouth every 4 (four) hours.    Take 1 tablet (10 mg total) by mouth in the morning, at noon, in the evening, and at bedtime.  Discontinued Medications   No medications on file    ----------------------------------------------------------------------------------------------------------------------  Follow-up: Return in about 2 months (around 09/12/2020) for evaluation, procedure.   Procedure: L5-S1 LESI with fluoroscopic guidance and no moderate sedation  NOTE: The risks, benefits, and expectations of the procedure have been discussed and explained to the patient who was understanding and in agreement with suggested treatment plan. No guarantees were made.  DESCRIPTION OF PROCEDURE: Lumbar epidural steroid injection with no IV Versed, EKG, blood pressure, pulse, and pulse oximetry monitoring. The procedure was performed with the patient in the prone position under fluoroscopic guidance.  Sterile prep x3 was initiated and I then injected subcutaneous lidocaine to the overlying 5 S1 site after its fluoroscopic identifictation.  Using strict aseptic technique, I then advanced an 18-gauge Tuohy epidural needle in the midline using interlaminar approach via loss-of-resistance to saline technique. There was negative aspiration for heme or  CSF.  I then confirmed position with both AP and Lateral fluoroscan.  2 cc of contrast dye were injected and a  total of 5 mL of Preservative-Free normal saline mixed with 40 mg of Kenalog and 1cc Ropicaine 0.2 percent were injected incrementally via the  epidurally placed needle. The needle was removed. The patient tolerated the injection well and was convalesced and discharged to home in stable condition. Should the patient have any post procedure difficulty they have been instructed on how to contact us for assistance.    Molli Barrows, MD

## 2020-07-13 NOTE — Patient Instructions (Signed)

## 2020-07-20 DIAGNOSIS — M25551 Pain in right hip: Secondary | ICD-10-CM | POA: Diagnosis not present

## 2020-07-22 ENCOUNTER — Ambulatory Visit: Payer: Medicare Other | Admitting: Dermatology

## 2020-07-22 ENCOUNTER — Other Ambulatory Visit: Payer: Self-pay

## 2020-07-22 ENCOUNTER — Ambulatory Visit (INDEPENDENT_AMBULATORY_CARE_PROVIDER_SITE_OTHER): Payer: Medicare Other | Admitting: Dermatology

## 2020-07-22 DIAGNOSIS — L814 Other melanin hyperpigmentation: Secondary | ICD-10-CM

## 2020-07-22 DIAGNOSIS — D489 Neoplasm of uncertain behavior, unspecified: Secondary | ICD-10-CM | POA: Diagnosis not present

## 2020-07-22 DIAGNOSIS — D229 Melanocytic nevi, unspecified: Secondary | ICD-10-CM

## 2020-07-22 DIAGNOSIS — L578 Other skin changes due to chronic exposure to nonionizing radiation: Secondary | ICD-10-CM

## 2020-07-22 NOTE — Patient Instructions (Addendum)
Melanoma ABCDEs  Melanoma is the most dangerous type of skin cancer, and is the leading cause of death from skin disease.  You are more likely to develop melanoma if you: Have light-colored skin, light-colored eyes, or red or blond hair Spend a lot of time in the sun Tan regularly, either outdoors or in a tanning bed Have had blistering sunburns, especially during childhood Have a close family member who has had a melanoma Have atypical moles or large birthmarks  Early detection of melanoma is key since treatment is typically straightforward and cure rates are extremely high if we catch it early.   The first sign of melanoma is often a change in a mole or a new dark spot.  The ABCDE system is a way of remembering the signs of melanoma.  A for asymmetry:  The two halves do not match. B for border:  The edges of the growth are irregular. C for color:  A mixture of colors are present instead of an even brown color. D for diameter:  Melanomas are usually (but not always) greater than 6mm - the size of a pencil eraser. E for evolution:  The spot keeps changing in size, shape, and color.  Please check your skin once per month between visits. You can use a small mirror in front and a large mirror behind you to keep an eye on the back side or your body.   If you see any new or changing lesions before your next follow-up, please call to schedule a visit.  Please continue daily skin protection including broad spectrum sunscreen SPF 30+ to sun-exposed areas, reapplying every 2 hours as needed when you're outdoors.   Staying in the shade or wearing long sleeves, sun glasses (UVA+UVB protection) and wide brim hats (4-inch brim around the entire circumference of the hat) are also recommended for sun protection.    Recommend taking Heliocare sun protection supplement daily in sunny weather for additional sun protection. For maximum protection on the sunniest days, you can take up to 2 capsules of  regular Heliocare OR take 1 capsule of Heliocare Ultra. For prolonged exposure (such as a full day in the sun), you can repeat your dose of the supplement 4 hours after your first dose. Heliocare can be purchased at [facility] or at [email or web address].    If you have any questions or concerns for your doctor, please call our main line at [phone] and press option 4 to reach your doctor's medical assistant. If no one answers, please leave a voicemail as directed and we will return your call as soon as possible. Messages left after 4 pm will be answered the following business day.   You may also send us a message via MyChart. We typically respond to MyChart messages within 1-2 business days.  For prescription refills, please ask your pharmacy to contact our office. Our fax number is [phone].  If you have an urgent issue when the clinic is closed that cannot wait until the next business day, you can page your doctor at the number below.    Please note that while we do our best to be available for urgent issues outside of office hours, we are not available 24/7.   If you have an urgent issue and are unable to reach us, you may choose to seek medical care at your doctor's office, retail clinic, urgent care center, or emergency room.  If you have a medical emergency, please immediately call 911 or go to   the emergency department.  Pager Numbers  - Dr. Kowalski: [phone]  - Dr. Moye: [phone]  - Dr. Stewart: [phone]  In the event of inclement weather, please call our main line at [phone] for an update on the status of any delays or closures.  Dermatology Medication Tips: Please keep the boxes that topical medications come in in order to help keep track of the instructions about where and how to use these. Pharmacies typically print the medication instructions only on the boxes and not directly on the medication tubes.   If your medication is too expensive,  please contact our office at [phone] option 4 or send us a message through MyChart.   We are unable to tell what your co-pay for medications will be in advance as this is different depending on your insurance coverage. However, we may be able to find a substitute medication at lower cost or fill out paperwork to get insurance to cover a needed medication.   If a prior authorization is required to get your medication covered by your insurance company, please allow us 1-2 business days to complete this process.  Drug prices often vary depending on where the prescription is filled and some pharmacies may offer cheaper prices.  The website www.goodrx.com contains coupons for medications through different pharmacies. The prices here do not account for what the cost may be with help from insurance (it may be cheaper with your insurance), but the website can give you the price if you did not use any insurance.  - You can print the associated coupon and take it with your prescription to the pharmacy.  - You may also stop by our office during regular business hours and pick up a GoodRx coupon card.  - If you need your prescription sent electronically to a different pharmacy, notify our office through [place] MyChart or by phone at [phone] option 4.  

## 2020-07-22 NOTE — Progress Notes (Signed)
   New Patient Visit  Subjective  Wendy Johnson is a 55 y.o. female who presents for the following: New Patient (Initial Visit) (Patient here today as a new patient. She was referred due to cyst on right jaw area. Patient states cyst started about 7 months ago and reports that she uses Ibuprofen 800mg  but it doesn't help with the pain. She denies any family or personal history of skin cancer. ).  No history of drainage per patient but she reports it is growing larger.   The following portions of the chart were reviewed this encounter and updated as appropriate:   Tobacco  Allergies  Meds  Problems  Med Hx  Surg Hx  Fam Hx      Review of Systems:  No other skin or systemic complaints except as noted in HPI or Assessment and Plan.  Objective  Well appearing patient in no apparent distress; mood and affect are within normal limits.  A focused examination was performed including face, neck, right jaw, chest. Relevant physical exam findings are noted in the Assessment and Plan.  Objective  right jaw: 0.9 cm tender nodule minimally mobile, deep   Assessment & Plan  Neoplasm of uncertain behavior right jaw  Ambulatory referral to ENT  Possible deep lipoma vs other -   Recommend referral to [facility] given depth of lesion and feeling fixed to jaw. Patient c/o significant pain at site which does not improve with Ibuprofen 800mg .     Lentigines - Scattered tan macules - Due to sun exposure - Benign-appering, observe - Recommend daily broad spectrum sunscreen SPF 30+ to sun-exposed areas, reapply every 2 hours as needed. - Call for any changes  Melanocytic Nevi - Tan-brown and/or pink-flesh-colored symmetric macules and papules - Benign appearing on exam today - Observation - Call clinic for new or changing moles - Recommend daily use of broad spectrum spf 30+ sunscreen to sun-exposed areas.   Actinic Damage - chronic, secondary to cumulative UV radiation exposure/sun  exposure over time - diffuse scaly erythematous macules with underlying dyspigmentation - Recommend daily broad spectrum sunscreen SPF 30+ to sun-exposed areas, reapply every 2 hours as needed.  - Recommend staying in the shade or wearing long sleeves, sun glasses (UVA+UVB protection) and wide brim hats (4-inch brim around the entire circumference of the hat). - Call for new or changing lesions.  Return if symptoms worsen or fail to improve.   I, Ruthell Rummage, CMA, am acting as scribe for Forest Gleason, MD.  Documentation: I have reviewed the above documentation for accuracy and completeness, and I agree with the above.  Forest Gleason, MD

## 2020-07-23 DIAGNOSIS — M7061 Trochanteric bursitis, right hip: Secondary | ICD-10-CM | POA: Diagnosis not present

## 2020-07-23 DIAGNOSIS — M6281 Muscle weakness (generalized): Secondary | ICD-10-CM | POA: Diagnosis not present

## 2020-07-23 DIAGNOSIS — M25551 Pain in right hip: Secondary | ICD-10-CM | POA: Diagnosis not present

## 2020-07-29 ENCOUNTER — Other Ambulatory Visit: Payer: Self-pay

## 2020-07-29 ENCOUNTER — Encounter: Payer: Self-pay | Admitting: Dermatology

## 2020-07-29 DIAGNOSIS — D489 Neoplasm of uncertain behavior, unspecified: Secondary | ICD-10-CM

## 2020-07-29 NOTE — Progress Notes (Unsigned)
amb  

## 2020-07-30 DIAGNOSIS — M25551 Pain in right hip: Secondary | ICD-10-CM | POA: Diagnosis not present

## 2020-08-03 DIAGNOSIS — M25551 Pain in right hip: Secondary | ICD-10-CM | POA: Diagnosis not present

## 2020-08-03 DIAGNOSIS — M7061 Trochanteric bursitis, right hip: Secondary | ICD-10-CM | POA: Diagnosis not present

## 2020-08-03 DIAGNOSIS — M6281 Muscle weakness (generalized): Secondary | ICD-10-CM | POA: Diagnosis not present

## 2020-08-05 ENCOUNTER — Other Ambulatory Visit: Payer: Self-pay | Admitting: Unknown Physician Specialty

## 2020-08-05 ENCOUNTER — Ambulatory Visit: Payer: Medicare Other | Admitting: Anesthesiology

## 2020-08-05 ENCOUNTER — Other Ambulatory Visit: Payer: Self-pay

## 2020-08-05 DIAGNOSIS — R221 Localized swelling, mass and lump, neck: Secondary | ICD-10-CM

## 2020-08-05 DIAGNOSIS — H9201 Otalgia, right ear: Secondary | ICD-10-CM | POA: Diagnosis not present

## 2020-08-05 DIAGNOSIS — F17200 Nicotine dependence, unspecified, uncomplicated: Secondary | ICD-10-CM | POA: Diagnosis not present

## 2020-08-06 ENCOUNTER — Ambulatory Visit: Payer: Medicare Other | Admitting: Anesthesiology

## 2020-08-07 ENCOUNTER — Other Ambulatory Visit: Payer: Self-pay

## 2020-08-07 ENCOUNTER — Ambulatory Visit: Payer: Medicare Other | Attending: Anesthesiology | Admitting: Anesthesiology

## 2020-08-13 ENCOUNTER — Telehealth: Payer: Self-pay

## 2020-08-13 NOTE — Telephone Encounter (Signed)
Dr. Andree Elk said 2 months. It has only been one month since her last procedure. If she feels that she needs one sooner than May, she will need to set up a VV to discuss with him. Please call and inform her. Thank you.

## 2020-08-13 NOTE — Telephone Encounter (Signed)
Pt is requesting to have a procedure she states Dr. Andree Elk stated it is okay , I need a order

## 2020-08-24 ENCOUNTER — Other Ambulatory Visit: Payer: Self-pay | Admitting: Orthopedic Surgery

## 2020-08-24 ENCOUNTER — Ambulatory Visit: Payer: Medicare Other

## 2020-08-24 DIAGNOSIS — E1169 Type 2 diabetes mellitus with other specified complication: Secondary | ICD-10-CM | POA: Diagnosis not present

## 2020-08-24 DIAGNOSIS — E785 Hyperlipidemia, unspecified: Secondary | ICD-10-CM | POA: Diagnosis not present

## 2020-08-24 DIAGNOSIS — M7061 Trochanteric bursitis, right hip: Secondary | ICD-10-CM

## 2020-08-24 DIAGNOSIS — G8929 Other chronic pain: Secondary | ICD-10-CM

## 2020-08-24 DIAGNOSIS — M25551 Pain in right hip: Secondary | ICD-10-CM | POA: Diagnosis not present

## 2020-08-26 ENCOUNTER — Ambulatory Visit: Admission: RE | Admit: 2020-08-26 | Payer: Medicare Other | Source: Ambulatory Visit

## 2020-09-01 ENCOUNTER — Other Ambulatory Visit: Payer: Self-pay

## 2020-09-01 ENCOUNTER — Ambulatory Visit: Payer: Medicare Other | Attending: Anesthesiology | Admitting: Anesthesiology

## 2020-09-02 ENCOUNTER — Telehealth: Payer: Self-pay | Admitting: Anesthesiology

## 2020-09-02 ENCOUNTER — Other Ambulatory Visit: Payer: Self-pay

## 2020-09-02 ENCOUNTER — Ambulatory Visit
Admission: RE | Admit: 2020-09-02 | Discharge: 2020-09-02 | Disposition: A | Discharge status: Home / Self Care | Payer: Medicare Other | Source: Ambulatory Visit | Attending: Orthopedic Surgery | Admitting: Orthopedic Surgery

## 2020-09-02 DIAGNOSIS — Z96641 Presence of right artificial hip joint: Secondary | ICD-10-CM | POA: Diagnosis not present

## 2020-09-02 DIAGNOSIS — M7061 Trochanteric bursitis, right hip: Secondary | ICD-10-CM | POA: Insufficient documentation

## 2020-09-02 DIAGNOSIS — Z471 Aftercare following joint replacement surgery: Secondary | ICD-10-CM | POA: Diagnosis not present

## 2020-09-02 DIAGNOSIS — M25551 Pain in right hip: Secondary | ICD-10-CM | POA: Insufficient documentation

## 2020-09-02 DIAGNOSIS — G8929 Other chronic pain: Secondary | ICD-10-CM

## 2020-09-02 DIAGNOSIS — Z9889 Other specified postprocedural states: Secondary | ICD-10-CM | POA: Diagnosis not present

## 2020-09-02 NOTE — Telephone Encounter (Signed)
Pt stated Dr. Andree Elk did not do her VV for her Med Mgmt appt please call she need her meds

## 2020-09-03 NOTE — Telephone Encounter (Signed)
I spoke with Dr. Andree Elk about this 09/02/2020 and he states that he did try to contact the patient but the call would not go through. The ONLY number we have for her is that number. I will alert the secretarial staff to update her phone number if she calls again.

## 2020-09-10 ENCOUNTER — Ambulatory Visit: Payer: Self-pay | Admitting: Internal Medicine

## 2020-09-10 NOTE — Progress Notes (Deleted)
Name: Wendy Johnson   MRN: [id number]    DOB: April 15, 1966   Date:09/10/2020       Progress Note  Subjective  Chief Complaint  Follow Up  HPI  GERD: she continues to have heart burn, had normal EGD back in the Spring of 2021, discussed avoiding trigger foods and nsaids   Left lower rib cage pain: she fell about one week ago and hit left rib cage on the floor, she is still having pain, she would like a x-ray. It is not causing any breathing problems.   HTN: she states bp at home has been showing DBP above 90, she has some hydralazine prn, we will try starting on Diovan, she uses CPAP at night. Monitor bp and let me know if needed. No chest pain or palpitation   History of DM : A1C has been normal for a while now and is off medication Denies polyphagia, polydipsia or polyuria.She still has low HDL, LDL elevated and triglycerides up. She has not been taking Atorvastatin or Lovaza, we will recheck labs next visit   Dyslipidemia: She has high triglycerides , low HDl and high LDL, she states she ran out of statin and also Lovaza and will resume it now, we will recheck labs on her next visit   DDD lumbar spine: she sees pain clinic- Dr Humberto Seals is on Oxycodone 10 mg QID, denies constipation - controlled with Benifiber, she has narcan at home. She also has OA of knee and shoulders and sees Ortho at [facility] and has steroid injections   Morbid obesity:her maximum weight was around 273lbs, she lost down to 184 lbs with physical activity and life style modification,she gradually gained weight back but is losing it again, her weight has been stable around 250 lbs for about one year, today is down to 241.9 lbs . She is cutting down on portion size, has also been avoiding sodas, eating more vegetables  Bipolar disorder: on multiple medications , given by Dr. Kasandra Knudsen. Reviewed medications with her   COPD: she states that she could not tolerate Breo, she has been on Spiriva, no cough or SOB  , she has not been using oxygen at night because she does not like using it, states too big. . Uses albuterol on machineand prn when at home. Denies daily cough, she has occasional wheeze at night. She would like to start lung cancer screen   Alcohol history: she completely quit Feb 2017 but had a relapse, she has quit again since June 2020    Patient Active Problem List   Diagnosis Date Noted  . Esophageal dysphagia   . History of colonic polyps   . Sciatica, right side 02/18/2019  . Pain in joint, shoulder region 12/19/2018  . Unspecified inflammatory spondylopathy, lumbar region ([place]) 10/04/2018  . BMI 40.0-44.9, adult ([place]) 08/17/2018  . Lumbar spondylosis 03/19/2018  . Polyarthralgia 03/19/2018  . History of alcoholism ([place]) 07/19/2017  . Bilateral carpal tunnel syndrome 05/29/2017  . Painful total knee replacement, right ([place]) 10/11/2016  . Rotator cuff tendinitis, right 07/29/2016  . Occipital neuralgia 10/12/2015  . Instability of prosthetic knee ([place]) 09/22/2015  . Primary osteoarthritis of right hip 05/12/2015  . Sleep apnea 04/07/2015  . Primary osteoarthritis of left hip 12/16/2014  . Metabolic syndrome 33/29/5188  . Migraine without aura and without status migrainosus, not intractable 11/26/2014  . GERD without esophagitis 11/26/2014  . COPD, moderate ([place]) 11/26/2014  . Nocturnal oxygen desaturation 11/26/2014  . Supplemental oxygen dependent 11/26/2014  .  Hearing loss 11/26/2014  . Pain syndrome, chronic 11/26/2014  . History of hypertension 11/26/2014  . Dyslipidemia 11/26/2014  . Morbid obesity ([place]) 11/26/2014  . Chronic constipation 11/26/2014  . Generalized anxiety disorder 11/11/2014  . DDD (degenerative disc disease), lumbar 11/11/2014  . Facet arthritis of lumbar region 11/11/2014  . Primary osteoarthritis involving multiple joints 11/11/2014  . H/O hysterectomy for benign disease 10/20/2014  . Low back derangement syndrome 09/30/2014  . Bipolar  disorder ([place]) 09/30/2014    Past Surgical History:  Procedure Laterality Date  . ABDOMINAL HYSTERECTOMY N/A 10/20/2014   Procedure: Total abdominial hysterectomy, bilateral salpingo-oophorectomy;  Surgeon: Brayton Mars, MD;  Location: [facility];  Service: Gynecology;  Laterality: N/A;  . BILATERAL SALPINGOOPHORECTOMY    . bone spurs removed Bilateral   . CARPAL TUNNEL RELEASE Left 06/08/2017   Procedure: CARPAL TUNNEL RELEASE;  Surgeon: Hessie Knows, MD;  Location: [facility];  Service: Orthopedics;  Laterality: Left;  . CARPAL TUNNEL RELEASE Right 10/10/2017   Procedure: CARPAL TUNNEL RELEASE;  Surgeon: Hessie Knows, MD;  Location: [facility];  Service: Orthopedics;  Laterality: Right;  . COLONOSCOPY WITH PROPOFOL N/A 09/01/2016   Procedure: COLONOSCOPY WITH PROPOFOL;  Surgeon: Jonathon Bellows, MD;  Location: [facility] ENDOSCOPY;  Service: Endoscopy;  Laterality: N/A;  . COLONOSCOPY WITH PROPOFOL N/A 07/30/2019   Procedure: COLONOSCOPY WITH PROPOFOL;  Surgeon: Lucilla Lame, MD;  Location: [facility] ENDOSCOPY;  Service: Endoscopy;  Laterality: N/A;  . DORSAL COMPARTMENT RELEASE Left 06/08/2017   Procedure: RELEASE DORSAL COMPARTMENT (DEQUERVAIN);  Surgeon: Hessie Knows, MD;  Location: [facility];  Service: Orthopedics;  Laterality: Left;  . ESOPHAGOGASTRODUODENOSCOPY (EGD) WITH PROPOFOL N/A 09/01/2016   Procedure: ESOPHAGOGASTRODUODENOSCOPY (EGD) WITH PROPOFOL;  Surgeon: Jonathon Bellows, MD;  Location: [facility] ENDOSCOPY;  Service: Endoscopy;  Laterality: N/A;  . ESOPHAGOGASTRODUODENOSCOPY (EGD) WITH PROPOFOL N/A 07/30/2019   Procedure: ESOPHAGOGASTRODUODENOSCOPY (EGD) WITH PROPOFOL;  Surgeon: Lucilla Lame, MD;  Location: [facility]COPY;  Service: Endoscopy;  Laterality: N/A;  . FOOT SURGERY Bilateral   . INSERTION OF MESH N/A 12/26/2017   Procedure: INSERTION OF MESH;  Surgeon: Jules Husbands, MD;  Location: [facility];  Service: General;  Laterality: N/A;  . JOINT REPLACEMENT Right    Total Knee replacement X 2  .  JOINT REPLACEMENT Bilateral    Total Hip Replacement  . knee arthroscopo Right   . LAPAROSCOPY  09/22/2014   Procedure: LAPAROSCOPY OPERATIVE;  Surgeon: Brayton Mars, MD;  Location: [facility];  Service: Gynecology;;  excision and fulgeration of endomertriosis  . LIPOMA EXCISION    . ROBOTIC ASSISTED LAPAROSCOPIC VENTRAL/INCISIONAL HERNIA REPAIR N/A 12/26/2017   Procedure: ROBOTIC ASSISTED LAPAROSCOPIC VENTRAL/INCISIONAL HERNIA REPAIR;  Surgeon: Jules Husbands, MD;  Location: [facility];  Service: General;  Laterality: N/A;  . TONSILLECTOMY    . TOTAL HIP ARTHROPLASTY Left 12/16/2014   Procedure: TOTAL HIP ARTHROPLASTY ANTERIOR APPROACH;  Surgeon: Hessie Knows, MD;  Location: [facility];  Service: Orthopedics;  Laterality: Left;  . TOTAL HIP ARTHROPLASTY Right 05/12/2015   Procedure: TOTAL HIP ARTHROPLASTY ANTERIOR APPROACH;  Surgeon: Hessie Knows, MD;  Location: [facility];  Service: Orthopedics;  Laterality: Right;  . TOTAL KNEE REVISION Right 09/22/2015   Procedure: TOTAL KNEE REVISION/ REVISE POLYIETHYLENE;  Surgeon: Hessie Knows, MD;  Location: [facility];  Service: Orthopedics;  Laterality: Right;  . TOTAL KNEE REVISION Right 10/11/2016   Procedure: TOTAL KNEE REVISION;  Surgeon: Hessie Knows, MD;  Location: [facility];  Service: Orthopedics;  Laterality: Right;  . TUBAL LIGATION    .  VENTRAL HERNIA REPAIR N/A 11/08/2018   Procedure: HERNIA REPAIR VENTRAL ADULT OPEN. DIABETIC, SLEEP APNEA;  Surgeon: Jules Husbands, MD;  Location: [facility];  Service: General;  Laterality: N/A;    Family History  Problem Relation Age of Onset  . Diabetes Mother   . Heart disease Father   . Heart attack Father   . Diabetes Sister   . Breast cancer Maternal Aunt        <50  . Breast cancer Paternal Aunt        x2.  <50  . Healthy Son   . Drug abuse Sister     Social History   Tobacco Use  . Smoking status: Current Every Day Smoker    Packs/day: 0.50    Years: 37.00    Pack years: 18.50    Types:  Cigarettes    Start date: 11/26/1979  . Smokeless tobacco: Never Used  Substance Use Topics  . Alcohol use: No    Alcohol/week: 0.0 standard drinks    Comment: but used to be a heavy drinker, quit after DUI 2021     Current Outpatient Medications:  .  albuterol (PROVENTIL) (2.5 MG/3ML) 0.083% nebulizer solution, Take 3 mLs (2.5 mg total) by nebulization every 6 (six) hours as needed for wheezing or shortness of breath., Disp: 75 mL, Rfl: 1 .  amphetamine-dextroamphetamine (ADDERALL) 15 MG tablet, Take 15 mg by mouth daily. , Disp: , Rfl:  .  amphetamine-dextroamphetamine (ADDERALL) 30 MG tablet, Take 30 mg by mouth 2 (two) times daily., Disp: , Rfl:  .  busPIRone (BUSPAR) 30 MG tablet, Take 30 mg by mouth 2 (two) times daily. , Disp: , Rfl:  .  clonazePAM (KLONOPIN) 1 MG tablet, Take 1 mg by mouth 6 (six) times daily. , Disp: , Rfl:  .  cyclobenzaprine (FLEXERIL) 10 MG tablet, Take 10 mg by mouth 3 (three) times daily., Disp: , Rfl:  .  dexlansoprazole (DEXILANT) 60 MG capsule, Take 1 capsule (60 mg total) by mouth daily., Disp: 90 capsule, Rfl: 1 .  hydrOXYzine (ATARAX/VISTARIL) 50 MG tablet, Take 50 mg by mouth as needed., Disp: , Rfl:  .  ibuprofen (ADVIL) 800 MG tablet, Take 1 tablet (800 mg total) by mouth every 8 (eight) hours as needed., Disp: 90 tablet, Rfl: 0 .  LATUDA 80 MG TABS tablet, Take 80 mg by mouth at bedtime., Disp: , Rfl:  .  naloxone (NARCAN) nasal spray 4 mg/0.1 mL, As directed for opioid induced respiratory depression, Disp: 1 each, Rfl: 1 .  naloxone (NARCAN) nasal spray 4 mg/0.1 mL, As directed for opioid induced respiratory depression (Patient not taking: No sig reported), Disp: 1 each, Rfl: 1 .  omega-3 acid ethyl esters (LOVAZA) 1 g capsule, Take 2 capsules (2 g total) by mouth 2 (two) times daily., Disp: 360 capsule, Rfl: 1 .  Oxycodone HCl 10 MG TABS, Take 1 tablet (10 mg total) by mouth every 4 (four) hours., Disp: 150 tablet, Rfl: 0 .  PROAIR HFA 108 (90 Base)  MCG/ACT inhaler, INHALE 2 PUFFS BY MOUTH 4 TIMES A DAY, Disp: 8.5 g, Rfl: 0 .  sertraline (ZOLOFT) 100 MG tablet, Take 100 mg by mouth 2 (two) times daily., Disp: , Rfl:  .  tiotropium (SPIRIVA HANDIHALER) 18 MCG inhalation capsule, Place 1 capsule (18 mcg total) into inhaler and inhale daily., Disp: 30 capsule, Rfl: 5 .  traZODone (DESYREL) 100 MG tablet, Take 400 mg by mouth at bedtime. , Disp: , Rfl:  Allergies  Allergen Reactions  . Codeine Nausea Only  . Imitrex [Sumatriptan] Other (See Comments)    Headaches    I personally reviewed {Reviewed:14835} with the patient/caregiver today.   ROS  ***  Objective  There were no vitals filed for this visit.  There is no height or weight on file to calculate BMI.  Physical Exam ***  No results found for this or any previous visit (from the past 2160 hour(s)).  Diabetic Foot Exam: Diabetic Foot Exam - Simple   No data filed    ***  PHQ2/9: Depression screen [facility] 2/9 05/14/2020 03/19/2020 01/15/2020 11/12/2019 07/17/2019  Decreased Interest 0 1 0 0 1  Down, Depressed, Hopeless 0 1 0 0 1  PHQ - 2 Score 0 2 0 0 2  Altered sleeping - 2 - - 0  Tired, decreased energy - 1 - - 1  Change in appetite - 0 - - 0  Feeling bad or failure about yourself  - 0 - - 0  Trouble concentrating - 0 - - 2  Moving slowly or fidgety/restless - 0 - - 0  Suicidal thoughts - 0 - - 0  PHQ-9 Score - 5 - - 5  Difficult doing work/chores - - - - Somewhat difficult  Some recent data might be hidden    phq 9 is {gen pos [id number]} ***  Fall Risk: Fall Risk  07/13/2020 05/14/2020 03/19/2020 01/15/2020 11/12/2019  Falls in the past year? 0 0 1 0 0  Number falls in past yr: - - 0 - -  Injury with Fall? - - 1 - -  Comment - - Ribs - -  Risk for fall due to : No Fall Risks - - - -  Follow up Falls evaluation completed - - - -  Comment - - - - -   ***   Functional Status Survey:   ***   Assessment & Plan  *** There are no diagnoses linked to this  encounter.

## 2020-09-11 ENCOUNTER — Ambulatory Visit: Payer: Medicare Other | Admitting: Family Medicine

## 2020-09-14 ENCOUNTER — Telehealth: Payer: Medicare Other | Admitting: Anesthesiology

## 2020-09-14 ENCOUNTER — Other Ambulatory Visit: Payer: Self-pay

## 2020-09-14 ENCOUNTER — Encounter: Payer: Self-pay | Admitting: Anesthesiology

## 2020-09-14 ENCOUNTER — Ambulatory Visit: Payer: Medicare Other | Admitting: Dermatology

## 2020-09-14 ENCOUNTER — Ambulatory Visit: Payer: Medicare Other | Attending: Anesthesiology | Admitting: Anesthesiology

## 2020-09-14 DIAGNOSIS — M5136 Other intervertebral disc degeneration, lumbar region: Secondary | ICD-10-CM | POA: Diagnosis not present

## 2020-09-14 DIAGNOSIS — M545 Low back pain, unspecified: Secondary | ICD-10-CM

## 2020-09-14 DIAGNOSIS — G894 Chronic pain syndrome: Secondary | ICD-10-CM

## 2020-09-14 DIAGNOSIS — M542 Cervicalgia: Secondary | ICD-10-CM | POA: Diagnosis not present

## 2020-09-14 DIAGNOSIS — M25551 Pain in right hip: Secondary | ICD-10-CM | POA: Diagnosis not present

## 2020-09-14 DIAGNOSIS — M5431 Sciatica, right side: Secondary | ICD-10-CM

## 2020-09-14 DIAGNOSIS — F1190 Opioid use, unspecified, uncomplicated: Secondary | ICD-10-CM | POA: Diagnosis not present

## 2020-09-14 DIAGNOSIS — M25552 Pain in left hip: Secondary | ICD-10-CM | POA: Diagnosis not present

## 2020-09-14 DIAGNOSIS — M47816 Spondylosis without myelopathy or radiculopathy, lumbar region: Secondary | ICD-10-CM

## 2020-09-14 MED ORDER — OXYCODONE HCL 10 MG PO TABS: 10.0000 mg | ORAL_TABLET | ORAL | 0 refills | Status: AC

## 2020-09-14 MED ORDER — OXYCODONE HCL 10 MG PO TABS: 10.0000 mg | ORAL_TABLET | ORAL | 0 refills | Status: DC

## 2020-09-15 ENCOUNTER — Encounter: Payer: Self-pay | Admitting: Anesthesiology

## 2020-09-15 NOTE — Progress Notes (Signed)
Virtual Visit via Telephone Note  I connected with Wendy Johnson on 09/15/20 at  2:40 PM EDT by telephone and verified that I am speaking with the correct person using two identifiers.  Location: Patient: Home Provider: Pain control center   I discussed the limitations, risks, security and privacy concerns of performing an evaluation and management service by telephone and the availability of in person appointments. I also discussed with the patient that there may be a patient responsible charge related to this service. The patient expressed understanding and agreed to proceed.   History of Present Illness: I spoke with Wendy Johnson via telephone as she was unable to link for the video portion of the conference.  She has had a significant amount of low back pain recently.  She is past due on renewal of her medication as well.  She maintains that the pain she is experiencing in the low back has been more severe lately.  In the past she has had previous epidural steroid injections to help with the sciatica symptoms that she has recurred periodically.  She has been doing her stretching exercises and despite this the pain has remained persistent.  The pain radiates from the low back into her hips buttocks and down into both calves.  She is getting a lot of cramping and this has been difficult to control despite medication management.  No change in lower extremity strength or function is mentioned at this time.  Bowel bladder function has been at baseline as well.  She takes her medications and generally gets 50 to 70% relief with the oxycodone dose but it has been lasting no longer than 4 to 5 hours.  She gets a lot of breakthrough pain and maintains that she has been quite miserable.  She would like to undergo an epidural injection at the next visit.   Observations/Objective:  Current Outpatient Medications:  .  albuterol (PROVENTIL) (2.5 MG/3ML) 0.083% nebulizer solution, Take 3 mLs (2.5 mg total) by  nebulization every 6 (six) hours as needed for wheezing or shortness of breath., Disp: 75 mL, Rfl: 1 .  amphetamine-dextroamphetamine (ADDERALL) 15 MG tablet, Take 15 mg by mouth daily. , Disp: , Rfl:  .  amphetamine-dextroamphetamine (ADDERALL) 30 MG tablet, Take 30 mg by mouth 2 (two) times daily., Disp: , Rfl:  .  busPIRone (BUSPAR) 30 MG tablet, Take 30 mg by mouth 2 (two) times daily. , Disp: , Rfl:  .  clonazePAM (KLONOPIN) 1 MG tablet, Take 1 mg by mouth 6 (six) times daily. , Disp: , Rfl:  .  cyclobenzaprine (FLEXERIL) 10 MG tablet, Take 10 mg by mouth 3 (three) times daily., Disp: , Rfl:  .  dexlansoprazole (DEXILANT) 60 MG capsule, Take 1 capsule (60 mg total) by mouth daily., Disp: 90 capsule, Rfl: 1 .  hydrOXYzine (ATARAX/VISTARIL) 50 MG tablet, Take 50 mg by mouth as needed., Disp: , Rfl:  .  ibuprofen (ADVIL) 800 MG tablet, Take 1 tablet (800 mg total) by mouth every 8 (eight) hours as needed., Disp: 90 tablet, Rfl: 0 .  LATUDA 80 MG TABS tablet, Take 80 mg by mouth at bedtime., Disp: , Rfl:  .  naloxone (NARCAN) nasal spray 4 mg/0.1 mL, As directed for opioid induced respiratory depression, Disp: 1 each, Rfl: 1 .  naloxone (NARCAN) nasal spray 4 mg/0.1 mL, As directed for opioid induced respiratory depression, Disp: 1 each, Rfl: 1 .  omega-3 acid ethyl esters (LOVAZA) 1 g capsule, Take 2 capsules (2 g  total) by mouth 2 (two) times daily., Disp: 360 capsule, Rfl: 1 .  Oxycodone HCl 10 MG TABS, Take 1 tablet (10 mg total) by mouth every 4 (four) hours., Disp: 150 tablet, Rfl: 0 .  [START ON 10/14/2020] Oxycodone HCl 10 MG TABS, Take 1 tablet (10 mg total) by mouth every 4 (four) hours., Disp: 150 tablet, Rfl: 0 .  PROAIR HFA 108 (90 Base) MCG/ACT inhaler, INHALE 2 PUFFS BY MOUTH 4 TIMES A DAY, Disp: 8.5 g, Rfl: 0 .  sertraline (ZOLOFT) 100 MG tablet, Take 100 mg by mouth 2 (two) times daily., Disp: , Rfl:  .  tiotropium (SPIRIVA HANDIHALER) 18 MCG inhalation capsule, Place 1 capsule (18  mcg total) into inhaler and inhale daily., Disp: 30 capsule, Rfl: 5 .  traZODone (DESYREL) 100 MG tablet, Take 400 mg by mouth at bedtime. , Disp: , Rfl:   Assessment and Plan: 1. DDD (degenerative disc disease), lumbar   2. Low back pain at multiple sites   3. Chronic, continuous use of opioids   4. Sciatica, right side   5. Chronic pain syndrome   6. Pain of both hip joints   7. Cervicalgia   8. Facet arthritis of lumbar region   Based on our discussion today and upon review of the [facility] practitioner database information I believe it is appropriate to refill her medications dated for May 9 and June 8.  This will be for 150 tablets to be taken every 4 hours up to 5 tablets/day as scheduled.  She understands potential side effects of chronic opioid maintenance therapy and we have been through the risk benefits on numerous occasions.  She has her Narcan for excess sedation side effects and she is aware on how to use it.  She would like to present for an epidural at her next visit and I think this is appropriate and hopefully we will be able to help improve upon the sciatica symptoms she has been experiencing.  I want her to continue efforts at weight loss stretching strengthening as previously reviewed and continue follow-up with her primary care physicians for baseline medical care with schedule return in 1 month.  Follow Up Instructions:    I discussed the assessment and treatment plan with the patient. The patient was provided an opportunity to ask questions and all were answered. The patient agreed with the plan and demonstrated an understanding of the instructions.   The patient was advised to call back or seek an in-person evaluation if the symptoms worsen or if the condition fails to improve as anticipated.  I provided 30 minutes of non-face-to-face time during this encounter.   Molli Barrows, MD

## 2020-09-17 ENCOUNTER — Ambulatory Visit: Payer: Medicare Other | Attending: Unknown Physician Specialty

## 2020-09-24 DIAGNOSIS — M25551 Pain in right hip: Secondary | ICD-10-CM | POA: Diagnosis not present

## 2020-09-24 DIAGNOSIS — M5416 Radiculopathy, lumbar region: Secondary | ICD-10-CM | POA: Diagnosis not present

## 2020-09-24 DIAGNOSIS — G8929 Other chronic pain: Secondary | ICD-10-CM | POA: Diagnosis not present

## 2020-09-24 DIAGNOSIS — M47816 Spondylosis without myelopathy or radiculopathy, lumbar region: Secondary | ICD-10-CM | POA: Diagnosis not present

## 2020-09-25 ENCOUNTER — Other Ambulatory Visit: Payer: Self-pay | Admitting: Orthopedic Surgery

## 2020-09-25 DIAGNOSIS — M5416 Radiculopathy, lumbar region: Secondary | ICD-10-CM

## 2020-09-25 DIAGNOSIS — M47816 Spondylosis without myelopathy or radiculopathy, lumbar region: Secondary | ICD-10-CM

## 2020-09-28 ENCOUNTER — Telehealth: Payer: Self-pay | Admitting: Family Medicine

## 2020-09-28 DIAGNOSIS — I10 Essential (primary) hypertension: Secondary | ICD-10-CM

## 2020-09-28 DIAGNOSIS — K219 Gastro-esophageal reflux disease without esophagitis: Secondary | ICD-10-CM

## 2020-09-29 ENCOUNTER — Other Ambulatory Visit: Payer: Self-pay | Admitting: Family Medicine

## 2020-09-29 ENCOUNTER — Other Ambulatory Visit: Payer: Self-pay

## 2020-09-29 DIAGNOSIS — J449 Chronic obstructive pulmonary disease, unspecified: Secondary | ICD-10-CM

## 2020-09-29 MED ORDER — VALSARTAN 80 MG PO TABS: 80.0000 mg | ORAL_TABLET | Freq: Every day | ORAL | 0 refills | Status: DC

## 2020-09-29 NOTE — Telephone Encounter (Signed)
Pt called back and is requesting albuterol in addition to the other medications

## 2020-09-29 NOTE — Telephone Encounter (Signed)
Spoke with patient and relayed your message. She would like to as for an albuterol solution refill as well. She states she is in need of a treatment and would not like to wait if possible. Tee'd up if appropriate.

## 2020-09-29 NOTE — Telephone Encounter (Signed)
Pt called back and scheduled next available appt, she is completely out and needs refills to last her until scheduled time. Please advise patient is in distress.

## 2020-09-29 NOTE — Telephone Encounter (Signed)
Pt has scheduled an appt with Malachy Mood for med refill for next week. She tried to sch with you but your booked. She would like to know if you would refill these three medications as she is completely out of all.

## 2020-09-30 ENCOUNTER — Telehealth: Payer: Self-pay | Admitting: Family Medicine

## 2020-09-30 ENCOUNTER — Other Ambulatory Visit: Payer: Self-pay | Admitting: Family Medicine

## 2020-09-30 DIAGNOSIS — K219 Gastro-esophageal reflux disease without esophagitis: Secondary | ICD-10-CM

## 2020-09-30 DIAGNOSIS — J449 Chronic obstructive pulmonary disease, unspecified: Secondary | ICD-10-CM

## 2020-09-30 MED ORDER — DEXLANSOPRAZOLE 60 MG PO CPDR: 1.0000 | DELAYED_RELEASE_CAPSULE | Freq: Every day | ORAL | 0 refills | Status: DC

## 2020-09-30 NOTE — Telephone Encounter (Signed)
Pt also would like to know if you could give her about 6 pills of dexilant to last until she sees [facility] on Tuesday. She stated that its the only thing that really helps

## 2020-10-02 ENCOUNTER — Other Ambulatory Visit: Payer: Self-pay

## 2020-10-02 ENCOUNTER — Ambulatory Visit
Admission: RE | Admit: 2020-10-02 | Discharge: 2020-10-02 | Disposition: A | Discharge status: Home / Self Care | Payer: Medicare Other | Source: Ambulatory Visit | Attending: Unknown Physician Specialty | Admitting: Unknown Physician Specialty

## 2020-10-02 DIAGNOSIS — R221 Localized swelling, mass and lump, neck: Secondary | ICD-10-CM | POA: Diagnosis not present

## 2020-10-02 DIAGNOSIS — K118 Other diseases of salivary glands: Secondary | ICD-10-CM | POA: Diagnosis not present

## 2020-10-02 DIAGNOSIS — R220 Localized swelling, mass and lump, head: Secondary | ICD-10-CM | POA: Diagnosis not present

## 2020-10-02 LAB — POCT I-STAT CREATININE: Creatinine, Ser: 0.6 mg/dL (ref 0.44–1.00)

## 2020-10-02 MED ORDER — IOHEXOL 300 MG/ML  SOLN
75.0000 mL | Freq: Once | INTRAMUSCULAR | Status: AC | PRN
  Administered 2020-10-02: 75 mL via INTRAVENOUS

## 2020-10-06 ENCOUNTER — Ambulatory Visit: Payer: Medicare Other | Admitting: Unknown Physician Specialty

## 2020-10-09 ENCOUNTER — Ambulatory Visit
Admission: RE | Admit: 2020-10-09 | Discharge: 2020-10-09 | Disposition: A | Discharge status: Home / Self Care | Payer: Medicare Other | Source: Ambulatory Visit | Attending: Orthopedic Surgery | Admitting: Orthopedic Surgery

## 2020-10-09 ENCOUNTER — Other Ambulatory Visit: Payer: Self-pay

## 2020-10-09 DIAGNOSIS — M47816 Spondylosis without myelopathy or radiculopathy, lumbar region: Secondary | ICD-10-CM | POA: Diagnosis not present

## 2020-10-09 DIAGNOSIS — M5416 Radiculopathy, lumbar region: Secondary | ICD-10-CM

## 2020-10-09 DIAGNOSIS — M545 Low back pain, unspecified: Secondary | ICD-10-CM | POA: Diagnosis not present

## 2020-10-26 ENCOUNTER — Ambulatory Visit (HOSPITAL_BASED_OUTPATIENT_CLINIC_OR_DEPARTMENT_OTHER): Payer: Medicare Other | Admitting: Anesthesiology

## 2020-10-26 ENCOUNTER — Encounter: Payer: Self-pay | Admitting: Anesthesiology

## 2020-10-26 ENCOUNTER — Other Ambulatory Visit: Payer: Self-pay | Admitting: Anesthesiology

## 2020-10-26 ENCOUNTER — Other Ambulatory Visit: Payer: Self-pay

## 2020-10-26 ENCOUNTER — Ambulatory Visit
Admission: RE | Admit: 2020-10-26 | Discharge: 2020-10-26 | Disposition: A | Discharge status: Home / Self Care | Payer: Medicare Other | Source: Ambulatory Visit | Attending: Anesthesiology | Admitting: Anesthesiology

## 2020-10-26 VITALS — BP 120/84 | HR 95 | Temp 96.5°F | Resp 20 | Ht 64.0 in | Wt 232.0 lb

## 2020-10-26 DIAGNOSIS — G8929 Other chronic pain: Secondary | ICD-10-CM

## 2020-10-26 DIAGNOSIS — M5136 Other intervertebral disc degeneration, lumbar region: Secondary | ICD-10-CM | POA: Insufficient documentation

## 2020-10-26 DIAGNOSIS — F1190 Opioid use, unspecified, uncomplicated: Secondary | ICD-10-CM | POA: Insufficient documentation

## 2020-10-26 DIAGNOSIS — M545 Low back pain, unspecified: Secondary | ICD-10-CM | POA: Diagnosis not present

## 2020-10-26 DIAGNOSIS — G894 Chronic pain syndrome: Secondary | ICD-10-CM | POA: Insufficient documentation

## 2020-10-26 DIAGNOSIS — R52 Pain, unspecified: Secondary | ICD-10-CM | POA: Diagnosis not present

## 2020-10-26 DIAGNOSIS — M5431 Sciatica, right side: Secondary | ICD-10-CM | POA: Diagnosis not present

## 2020-10-26 DIAGNOSIS — M47816 Spondylosis without myelopathy or radiculopathy, lumbar region: Secondary | ICD-10-CM | POA: Diagnosis not present

## 2020-10-26 MED ORDER — OXYCODONE HCL 10 MG PO TABS: 10.0000 mg | ORAL_TABLET | Freq: Every day | ORAL | 0 refills | Status: AC | PRN

## 2020-10-26 MED ORDER — LACTATED RINGERS IV SOLN: 1000.0000 mL | INTRAVENOUS | Status: DC

## 2020-10-26 MED ORDER — TRIAMCINOLONE ACETONIDE 40 MG/ML IJ SUSP
INTRAMUSCULAR | Status: AC
  Filled 2020-10-26: qty 1

## 2020-10-26 MED ORDER — ROPIVACAINE HCL 2 MG/ML IJ SOLN
INTRAMUSCULAR | Status: AC
  Filled 2020-10-26: qty 20

## 2020-10-26 MED ORDER — MIDAZOLAM HCL 5 MG/5ML IJ SOLN: 5.0000 mg | Freq: Once | INTRAMUSCULAR | Status: DC

## 2020-10-26 MED ORDER — LIDOCAINE HCL (PF) 1 % IJ SOLN
INTRAMUSCULAR | Status: AC
  Filled 2020-10-26: qty 10

## 2020-10-26 MED ORDER — LIDOCAINE HCL (PF) 1 % IJ SOLN
INTRAMUSCULAR | Status: AC
  Filled 2020-10-26: qty 5

## 2020-10-26 MED ORDER — TRIAMCINOLONE ACETONIDE 40 MG/ML IJ SUSP: 40.0000 mg | Freq: Once | INTRAMUSCULAR | Status: DC

## 2020-10-26 MED ORDER — ROPIVACAINE HCL 2 MG/ML IJ SOLN: 10.0000 mL | Freq: Once | INTRAMUSCULAR | Status: DC

## 2020-10-26 MED ORDER — SODIUM CHLORIDE 0.9% FLUSH: 10.0000 mL | Freq: Once | INTRAVENOUS | Status: DC

## 2020-10-26 MED ORDER — LIDOCAINE HCL (PF) 1 % IJ SOLN: 5.0000 mL | Freq: Once | INTRAMUSCULAR | Status: DC

## 2020-10-26 NOTE — Patient Instructions (Signed)
____________________________________________________________________________________________  Post-Procedure Discharge Instructions  Instructions: Apply ice:  Purpose: This will minimize any swelling and discomfort after procedure.  When: Day of procedure, as soon as you get home. How: Fill a plastic sandwich bag with crushed ice. Cover it with a small towel and apply to injection site. How long: (15 min on, 15 min off) Apply for 15 minutes then remove x 15 minutes.  Repeat sequence on day of procedure, until you go to bed. Apply heat:  Purpose: To treat any soreness and discomfort from the procedure. When: Starting the next day after the procedure. How: Apply heat to procedure site starting the day following the procedure. How long: May continue to repeat daily, until discomfort goes away. Food intake: Start with clear liquids (like water) and advance to regular food, as tolerated.  Physical activities: Keep activities to a minimum for the first 8 hours after the procedure. After that, then as tolerated. Driving: If you have received any sedation, be responsible and do not drive. You are not allowed to drive for 24 hours after having sedation. Blood thinner: (Applies only to those taking blood thinners) You may restart your blood thinner 6 hours after your procedure. Insulin: (Applies only to Diabetic patients taking insulin) As soon as you can eat, you may resume your normal dosing schedule. Infection prevention: Keep procedure site clean and dry. Shower daily and clean area with soap and water. Post-procedure Pain Diary: Extremely important that this be done correctly and accurately. Recorded information will be used to determine the next step in treatment. For the purpose of accuracy, follow these rules: Evaluate only the area treated. Do not report or include pain from an untreated area. For the purpose of this evaluation, ignore all other areas of pain, except for the treated area. After  your procedure, avoid taking a long nap and attempting to complete the pain diary after you wake up. Instead, set your alarm clock to go off every hour, on the hour, for the initial 8 hours after the procedure. Document the duration of the numbing medicine, and the relief you are getting from it. Do not go to sleep and attempt to complete it later. It will not be accurate. If you received sedation, it is likely that you were given a medication that may cause amnesia. Because of this, completing the diary at a later time may cause the information to be inaccurate. This information is needed to plan your care. Follow-up appointment: Keep your post-procedure follow-up evaluation appointment after the procedure (usually 2 weeks for most procedures, 6 weeks for radiofrequencies). DO NOT FORGET to bring you pain diary with you.   Expect: (What should I expect to see with my procedure?) From numbing medicine (AKA: Local Anesthetics): Numbness or decrease in pain. You may also experience some weakness, which if present, could last for the duration of the local anesthetic. Onset: Full effect within 15 minutes of injected. Duration: It will depend on the type of local anesthetic used. On the average, 1 to 8 hours.  From steroids (Applies only if steroids were used): Decrease in swelling or inflammation. Once inflammation is improved, relief of the pain will follow. Onset of benefits: Depends on the amount of swelling present. The more swelling, the longer it will take for the benefits to be seen. In some cases, up to 10 days. Duration: Steroids will stay in the system x 2 weeks. Duration of benefits will depend on multiple posibilities including persistent irritating factors. Side-effects: If present, they  may typically last 2 weeks (the duration of the steroids). Frequent: Cramps (if they occur, drink Gatorade and take over-the-counter Magnesium 450-500 mg once to twice a day); water retention with temporary  weight gain; increases in blood sugar; decreased immune system response; increased appetite. Occasional: Facial flushing (red, warm cheeks); mood swings; menstrual changes. Uncommon: Long-term decrease or suppression of natural hormones; bone thinning. (These are more common with higher doses or more frequent use. This is why we prefer that our patients avoid having any injection therapies in other practices.)  Very Rare: Severe mood changes; psychosis; aseptic necrosis. From procedure: Some discomfort is to be expected once the numbing medicine wears off. This should be minimal if ice and heat are applied as instructed.  Call if: (When should I call?) You experience numbness and weakness that gets worse with time, as opposed to wearing off. New onset bowel or bladder incontinence. (Applies only to procedures done in the spine)  Emergency Numbers: Durning business hours (Monday - Thursday, 8:00 AM - 4:00 PM) (Friday, 9:00 AM - 12:00 Noon): [phone] After hours: [phone] NOTE: If you are having a problem and are unable connect with, or to talk to a provider, then go to your nearest urgent care or emergency department. If the problem is serious and urgent, please call 911. ____________________________________________________________________________________________  Facet Joint Block The facet joints connect the bones of the spine (vertebrae). They make it possible for you to bend, twist, and make other movements with your spine. They also keep you from bending too far, twisting too far, andmaking other extreme movements. A facet joint block is a procedure in which a numbing medicine (anesthetic) is injected into a facet joint. In many cases, an anti-inflammatory medicine (steroid) is also injected. A facet joint block may be done: To diagnose neck or back pain. If the pain gets better after a facet joint block, it means the pain is probably coming from the facet joint. If the pain does  not get better, it means the pain is probably not coming from the facet joint. To relieve neck or back pain that is caused by an inflamed facet joint. A facet joint block is only done to relieve pain if the pain does not improvewith other methods, such as medicine, exercise programs, and physical therapy. Tell a health care provider about: Any allergies you have. All medicines you are taking, including vitamins, herbs, eye drops, creams, and over-the-counter medicines. Any problems you or family members have had with anesthetic medicines. Any blood disorders you have. Any surgeries you have had. Any medical conditions you have or have had. Whether you are pregnant or may be pregnant. What are the risks? Generally, this is a safe procedure. However, problems may occur, including: Bleeding. Injury to a nerve near the injection site. Pain at the injection site. Weakness or numbness in areas controlled by nerves near the injection site. Infection. Temporary fluid retention. Allergic reactions to medicines or dyes. Injury to other structures or organs near the injection site. What happens before the procedure? Medicines Ask your health care provider about: Changing or stopping your regular medicines. This is especially important if you are taking diabetes medicines or blood thinners. Taking medicines such as aspirin and ibuprofen. These medicines can thin your blood. Do not take these medicines unless your health care provider tells you to take them. Taking over-the-counter medicines, vitamins, herbs, and supplements. Eating and drinking Follow instructions from your health care provider about eating and drinking, which may  include: 8 hours before the procedure - stop eating heavy meals or foods, such as meat, fried foods, or fatty foods. 6 hours before the procedure - stop eating light meals or foods, such as toast or cereal. 6 hours before the procedure - stop drinking milk or drinks that  contain milk. 2 hours before the procedure - stop drinking clear liquids. Staying hydrated Follow instructions from your health care provider about hydration, which may include: Up to 2 hours before the procedure - you may continue to drink clear liquids, such as water, clear fruit juice, black coffee, and plain tea. General instructions Do not use any products that contain nicotine or tobacco for at least 4-6 weeks before the procedure. These products include cigarettes, e-cigarettes, and chewing tobacco. If you need help quitting, ask your health care provider. Plan to have someone take you home from the hospital or clinic. Ask your health care provider: How your surgery site will be marked. What steps will be taken to help prevent infection. These may include: Removing hair at the surgery site. Washing skin with a germ-killing soap. Receiving antibiotic medicine. What happens during the procedure?  You will put on a hospital gown. You will lie on your stomach on an X-ray table. You may be asked to lie in a different position if an injection will be made in your neck. Machines will be used to monitor your oxygen levels, heart rate, and blood pressure. Your skin will be cleaned. If an injection will be made in your neck, an IV will be inserted into one of your veins. Fluids and medicine will flow directly into your body through the IV. A numbing medicine (local anesthetic) will be applied to your skin. Your skin may sting or burn for a moment. A video X-ray machine (fluoroscopy) will be used to find the joint. In some cases, a CT scan may be used. A contrast dye may be injected into the facet joint area to help find the joint. When the joint is located, an anesthetic will be injected into the joint through the needle. Your health care provider will ask you whether you feel pain relief. If you feel relief, a steroid may be injected to provide pain relief for a longer period of time. If you  do not feel relief or feel only partial relief, additional injections of an anesthetic may be made in other facet joints. The needle will be removed. Your skin will be cleaned. A bandage (dressing) will be applied over each injection site. The procedure may vary among health care providers and hospitals. What happens after the procedure? Your blood pressure, heart rate, breathing rate, and blood oxygen level will be monitored until you leave the hospital or clinic. You will lie down and rest for a period of time. Summary A facet joint block is a procedure in which a numbing medicine (anesthetic) is injected into a facet joint. An anti-inflammatory medicine (stereoid) may also be injected. Follow instructions from your health care provider about medicines and eating and drinking before the procedure. Do not use any products that contain nicotine or tobacco for at least 4-6 weeks before the procedure. You will lie on your stomach for the procedure, but you may be asked to lie in a different position if an injection will be made in your neck. When the joint is located, an anesthetic will be injected into the joint through the needle. This information is not intended to replace advice given to you by your health  care provider. Make sure you discuss any questions you have with your healthcare provider. Document Revised: 08/16/2018 Document Reviewed: 03/30/2018 Elsevier Patient Education  2022 [employer].

## 2020-10-26 NOTE — Progress Notes (Signed)
Nursing Pain Medication Assessment:  Safety precautions to be maintained throughout the outpatient stay will include: orient to surroundings, keep bed in low position, maintain call bell within reach at all times, provide assistance with transfer out of bed and ambulation.  Medication Inspection Compliance: Pill count conducted under aseptic conditions, in front of the patient. Neither the pills nor the bottle was removed from the patient's sight at any time. Once count was completed pills were immediately returned to the patient in their original bottle.  Medication: Oxycodone IR Pill/Patch Count:  60 of 150 pills remain Pill/Patch Appearance: Markings consistent with prescribed medication Bottle Appearance: Standard pharmacy container. Clearly labeled. Filled Date: 06 / 08 / 2022 Last Medication intake:  Today

## 2020-10-27 ENCOUNTER — Other Ambulatory Visit: Payer: Self-pay | Admitting: *Deleted

## 2020-10-27 ENCOUNTER — Telehealth: Payer: Self-pay

## 2020-10-27 DIAGNOSIS — G894 Chronic pain syndrome: Secondary | ICD-10-CM | POA: Diagnosis not present

## 2020-10-27 NOTE — Telephone Encounter (Signed)
Post procedure phone call. Patient states she is having some pain.  Instructed to put heat today and to call us for any further questions or concerns.

## 2020-10-30 ENCOUNTER — Other Ambulatory Visit: Payer: Self-pay | Admitting: Family Medicine

## 2020-10-30 DIAGNOSIS — K219 Gastro-esophageal reflux disease without esophagitis: Secondary | ICD-10-CM

## 2020-10-30 MED ORDER — DEXLANSOPRAZOLE 60 MG PO CPDR: 1.0000 | DELAYED_RELEASE_CAPSULE | Freq: Every day | ORAL | 0 refills | Status: DC

## 2020-10-30 NOTE — Telephone Encounter (Signed)
Medication Refill - Medication: dexlansoprazole (DEXILANT) 60 MG capsule Pt says she runs out of her current supply today. She is scheduled for July 11th. She is asking for it to be refilled as soon as possible. She says she throws up without it. Requesting enough to last her until scheduled time.   Has the patient contacted their pharmacy? No. (Agent: If no, request that the patient contact the pharmacy for the refill.) (Agent: If yes, when and what did the pharmacy advise?)  Preferred Pharmacy (with phone number or street name):  [facility] [address] [place], [place] - [place]  [place] [address]  Phone: [phone] Fax: [phone]    Agent: Please be advised that RX refills may take up to 3 business days. We ask that you follow-up with your pharmacy.

## 2020-11-02 ENCOUNTER — Other Ambulatory Visit: Payer: Self-pay | Admitting: Family Medicine

## 2020-11-02 DIAGNOSIS — J449 Chronic obstructive pulmonary disease, unspecified: Secondary | ICD-10-CM

## 2020-11-03 ENCOUNTER — Telehealth: Payer: Self-pay | Admitting: Family Medicine

## 2020-11-03 NOTE — Telephone Encounter (Signed)
Spoke with Dian Situ at [facility] and gave clarification on the albuterol medication refill and informed him that per Dr. Ancil Boozer the medication quantity is to be 90 ml with no refills.

## 2020-11-04 LAB — TOXASSURE SELECT 13 (MW), URINE

## 2020-11-12 NOTE — Progress Notes (Signed)
Name: Wendy Johnson   MRN: [id number]    DOB: Aug 30, 1965   Date:11/16/2020       Progress Note  Subjective  Chief Complaint  Medication Refill  HPI  RUQ: she states she has noticed intermittent RUQ pain over the past 8 months, she states this episode started 3 hours after she ate dinner ( chicken pot pie and cucumbers), pain is sharp, intense and kept her up at night, does not radiate. Worse when she applies pressure, had chills, but no fever, appetite is normal ( she ate steak this morning) , no change in bowel movements, no rashes. She has nausea but no vomiting. She has pain medications at home ( for chronic pain syndrome) but does not control this pain.   Hives: she states it is intermittent, she states when she is stressed it is worse, this episode started a few days ago, taking benadryl prn  GERD: she continues to have heart burn, had normal EGD back in the Spring of 2021, discussed avoiding trigger foods and nsaids, she states Dexilant works well for her   HTN: she is taking Diovan 80 mg and denies chest pain or palpitation. She is compliant and denies side effects    History of DM : A1C has been normal for a while now and is off medication Denies polyphagia, polydipsia or polyuria. She still has low HDL, LDL elevated and triglycerides up. She has not been taking Atorvastatin or Lovaza, we will recheck labs next visit    Dyslipidemia:  She has high triglycerides , low HDl and high LDL, she has been off medications, we will recheck it today. She states cholesterol medication was given her headaches   DDD lumbar spine: she sees pain clinic - Dr Andree Elk, she is on Oxycodone 10 mg QID, denies constipation - controlled with Benifiber, she has narcan at home. She also has OA of knee and shoulders and sees Ortho at [facility] and has steroid injections . Stable.    Morbid obesity: her maximum weight was around 273 lbs, she lost down to 184 lbs with physical activity and life style  modification, she gradually gained weight back, went up to 250 lbs, today is down to 244 lbs She is cutting down on portion size, but is drinking sodas again.    Bipolar disorder: on multiple medications , given by Dr. Kasandra Knudsen . Reviewed medications with her . Unchanged, came in with her sister today    COPD : she states that she could not tolerate Breo, she has been on Spiriva, no cough or SOB, she states Spiriva is too high, we will try Anoro instead, she has not been using oxygen at night because she does not like using it, states too big. Uses albuterol on machine and prn when at home. Denies daily cough, she has occasional wheeze at night.    Alcohol history: she completely quit Feb 2017 but had a relapse, she has quit again since June 2020 , unchanged   Patient Active Problem List   Diagnosis Date Noted   Esophageal dysphagia    History of colonic polyps    Sciatica, right side 02/18/2019   Pain in joint, shoulder region 12/19/2018   Unspecified inflammatory spondylopathy, lumbar region ([place]) 10/04/2018   BMI 40.0-44.9, adult ([place]) 08/17/2018   Lumbar spondylosis 03/19/2018   Polyarthralgia 03/19/2018   History of alcoholism ([place]) 07/19/2017   Bilateral carpal tunnel syndrome 05/29/2017   Painful total knee replacement, right ([place]) 10/11/2016   Rotator cuff  tendinitis, right 07/29/2016   Occipital neuralgia 10/12/2015   Instability of prosthetic knee ([place]) 09/22/2015   Primary osteoarthritis of right hip 05/12/2015   Sleep apnea 04/07/2015   Primary osteoarthritis of left hip 54/27/0623   Metabolic syndrome 76/28/3151   Migraine without aura and without status migrainosus, not intractable 11/26/2014   GERD without esophagitis 11/26/2014   COPD, moderate ([place]) 11/26/2014   Nocturnal oxygen desaturation 11/26/2014   Supplemental oxygen dependent 11/26/2014   Hearing loss 11/26/2014   Pain syndrome, chronic 11/26/2014   History of hypertension 11/26/2014   Dyslipidemia 11/26/2014    Morbid obesity ([place]) 11/26/2014   Chronic constipation 11/26/2014   Generalized anxiety disorder 11/11/2014   DDD (degenerative disc disease), lumbar 11/11/2014   Facet arthritis of lumbar region 11/11/2014   Primary osteoarthritis involving multiple joints 11/11/2014   H/O hysterectomy for benign disease 10/20/2014   Low back derangement syndrome 09/30/2014   Bipolar disorder ([place]) 09/30/2014    Past Surgical History:  Procedure Laterality Date   ABDOMINAL HYSTERECTOMY N/A 10/20/2014   Procedure: Total abdominial hysterectomy, bilateral salpingo-oophorectomy;  Surgeon: Brayton Mars, MD;  Location: [facility];  Service: Gynecology;  Laterality: N/A;   BILATERAL SALPINGOOPHORECTOMY     bone spurs removed Bilateral    CARPAL TUNNEL RELEASE Left 06/08/2017   Procedure: CARPAL TUNNEL RELEASE;  Surgeon: Hessie Knows, MD;  Location: [facility];  Service: Orthopedics;  Laterality: Left;   CARPAL TUNNEL RELEASE Right 10/10/2017   Procedure: CARPAL TUNNEL RELEASE;  Surgeon: Hessie Knows, MD;  Location: [facility];  Service: Orthopedics;  Laterality: Right;   COLONOSCOPY WITH PROPOFOL N/A 09/01/2016   Procedure: COLONOSCOPY WITH PROPOFOL;  Surgeon: Jonathon Bellows, MD;  Location: [facility];  Service: Endoscopy;  Laterality: N/A;   COLONOSCOPY WITH PROPOFOL N/A 07/30/2019   Procedure: COLONOSCOPY WITH PROPOFOL;  Surgeon: Lucilla Lame, MD;  Location: [facility];  Service: Endoscopy;  Laterality: N/A;   DORSAL COMPARTMENT RELEASE Left 06/08/2017   Procedure: RELEASE DORSAL COMPARTMENT (DEQUERVAIN);  Surgeon: Hessie Knows, MD;  Location: [facility];  Service: Orthopedics;  Laterality: Left;   ESOPHAGOGASTRODUODENOSCOPY (EGD) WITH PROPOFOL N/A 09/01/2016   Procedure: ESOPHAGOGASTRODUODENOSCOPY (EGD) WITH PROPOFOL;  Surgeon: Jonathon Bellows, MD;  Location: [facility] END[facility];  Service: Endoscopy;  Laterality: N/A;   ESOPHAGOGASTRODUODENOSCOPY (EGD) WITH PROPOFOL N/A 07/30/2019   Procedure:  ESOPHAGOGASTRODUODENOSCOPY (EGD) WITH PROPOFOL;  Surgeon: Lucilla Lame, MD;  Location: [facility] ENDOSCOPY;  Service: Endoscopy;  Laterality: N/A;   FOOT SURGERY Bilateral    INSERTION OF MESH N/A 12/26/2017   Procedure: INSERTION OF MESH;  Surgeon: Jules Husbands, MD;  Location: [facility];  Service: General;  Laterality: N/A;   JOINT REPLACEMENT Right    Total Knee replacement X 2   JOINT REPLACEMENT Bilateral    Total Hip Replacement   knee arthroscopo Right    LAPAROSCOPY  09/22/2014   Procedure: LAPAROSCOPY OPERATIVE;  Surgeon: Brayton Mars, MD;  Location: [facility];  Service: Gynecology;;  excision and fulgeration of endomertriosis   LIPOMA EXCISION     ROBOTIC ASSISTED LAPAROSCOPIC VENTRAL/INCISIONAL HERNIA REPAIR N/A 12/26/2017   Procedure: ROBOTIC ASSISTED LAPAROSCOPIC VENTRAL/INCISIONAL [place];  Surgeon: Jules Husbands, MD;  Location: [facility];  Service: General;  Laterality: N/A;   TONSILLECTOMY     TOTAL HIP ARTHROPLASTY Left 12/16/2014   Procedure: TOTAL HIP ARTHROPLASTY ANTERIOR APPROACH;  Surgeon: Hessie Knows, MD;  Location: [facility];  Service: Orthopedics;  Laterality: Left;   TOTAL HIP ARTHROPLASTY Right 05/12/2015   Procedure: TOTAL HIP ARTHROPLASTY  ANTERIOR APPROACH;  Surgeon: Hessie Knows, MD;  Location: [facility];  Service: Orthopedics;  Laterality: Right;   TOTAL KNEE REVISION Right 09/22/2015   Procedure: TOTAL KNEE REVISION/ REVISE POLYIETHYLENE;  Surgeon: Hessie Knows, MD;  Location: [facility];  Service: Orthopedics;  Laterality: Right;   TOTAL KNEE REVISION Right 10/11/2016   Procedure: TOTAL KNEE REVISION;  Surgeon: Hessie Knows, MD;  Location: [facility];  Service: Orthopedics;  Laterality: Right;   TUBAL LIGATION     VENTRAL HERNIA REPAIR N/A 11/08/2018   Procedure: HERNIA REPAIR VENTRAL ADULT OPEN. DIABETIC, SLEEP APNEA;  Surgeon: Jules Husbands, MD;  Location: [facility];  Service: General;  Laterality: N/A;    Family History  Problem Relation Age of Onset    Diabetes Mother    Heart disease Father    Heart attack Father    Diabetes Sister    Breast cancer Maternal Aunt        <50   Breast cancer Paternal Aunt        x2.  <50   Healthy Son    Drug abuse Sister     Social History   Tobacco Use   Smoking status: Every Day    Packs/day: 0.50    Years: 37.00    Pack years: 18.50    Types: Cigarettes    Start date: 11/26/1979   Smokeless tobacco: Never  Substance Use Topics   Alcohol use: No    Alcohol/week: 0.0 standard drinks    Comment: but used to be a heavy drinker, quit after DUI 2021     Current Outpatient Medications:    albuterol (PROVENTIL) (2.5 MG/3ML) 0.083% nebulizer solution, USE 1 VIAL (3ML) BY NEBULIZATION EVERY 6HOURS AS NEEDED FOR WHEEZING OR SHORTNESS OF BREATH, Disp: 30 mL, Rfl: 0   amphetamine-dextroamphetamine (ADDERALL) 15 MG tablet, Take 15 mg by mouth daily. , Disp: , Rfl:    busPIRone (BUSPAR) 30 MG tablet, Take 30 mg by mouth 2 (two) times daily. , Disp: , Rfl:    clonazePAM (KLONOPIN) 1 MG tablet, Take 1 mg by mouth 6 (six) times daily. , Disp: , Rfl:    cyclobenzaprine (FLEXERIL) 10 MG tablet, Take 10 mg by mouth 3 (three) times daily., Disp: , Rfl:    dexlansoprazole (DEXILANT) 60 MG capsule, Take 1 capsule (60 mg total) by mouth daily., Disp: 30 capsule, Rfl: 0   hydrOXYzine (ATARAX/VISTARIL) 50 MG tablet, Take 50 mg by mouth as needed., Disp: , Rfl:    ibuprofen (ADVIL) 800 MG tablet, Take 1 tablet (800 mg total) by mouth every 8 (eight) hours as needed., Disp: 90 tablet, Rfl: 0   LATUDA 80 MG TABS tablet, Take 80 mg by mouth at bedtime., Disp: , Rfl:    naloxone (NARCAN) nasal spray 4 mg/0.1 mL, As directed for opioid induced respiratory depression, Disp: 1 each, Rfl: 1   naloxone (NARCAN) nasal spray 4 mg/0.1 mL, As directed for opioid induced respiratory depression, Disp: 1 each, Rfl: 1   omega-3 acid ethyl esters (LOVAZA) 1 g capsule, Take 2 capsules (2 g total) by mouth 2 (two) times daily., Disp:  360 capsule, Rfl: 1   Oxycodone HCl 10 MG TABS, Take 1 tablet (10 mg total) by mouth 5 (five) times daily as needed., Disp: 150 tablet, Rfl: 0   PROAIR HFA 108 (90 Base) MCG/ACT inhaler, INHALE 2 PUFFS BY MOUTH 4 TIMES A DAY, Disp: 8.5 g, Rfl: 0   sertraline (ZOLOFT) 100 MG tablet, Take 100 mg by mouth  2 (two) times daily., Disp: , Rfl:    tiotropium (SPIRIVA HANDIHALER) 18 MCG inhalation capsule, Place 1 capsule (18 mcg total) into inhaler and inhale daily., Disp: 30 capsule, Rfl: 5   traZODone (DESYREL) 100 MG tablet, Take 400 mg by mouth at bedtime. , Disp: , Rfl:    valsartan (DIOVAN) 80 MG tablet, TAKE 1 TABLET BY MOUTH DAILY, Disp: 30 tablet, Rfl: 0   amphetamine-dextroamphetamine (ADDERALL) 30 MG tablet, Take 30 mg by mouth 2 (two) times daily., Disp: , Rfl:   Allergies  Allergen Reactions   Codeine Nausea Only   Imitrex [Sumatriptan] Other (See Comments)    Headaches    I personally reviewed active problem list, medication list, allergies, family history, social history, health maintenance with the patient/caregiver today.   ROS  Constitutional: Negative for fever or weight change.  Respiratory: Negative for cough and shortness of breath.   Cardiovascular: Negative for chest pain or palpitations.  Gastrointestinal: Negative for abdominal pain, no bowel changes.  Musculoskeletal: Negative for gait problem or joint swelling.  Skin: Negative for rash.  Neurological: Negative for dizziness or headache.  No other specific complaints in a complete review of systems (except as listed in HPI above).   Objective  Vitals:   11/16/20 1101  BP: 132/84  Pulse: 91  Resp: 18  Temp: 98.2 F (36.8 C)  TempSrc: Oral  SpO2: 95%  Weight: 244 lb 14.4 oz (111.1 kg)  Height: 5\' 4"  (1.626 m)    Body mass index is 42.04 kg/m.  Physical Exam  Constitutional: Patient appears well-developed and well-nourished. Obese  , complaining of pain but did not seem in distress  HEENT: head  atraumatic, normocephalic, pupils equal and reactive to light,neck supple Cardiovascular: Normal rate, regular rhythm and normal heart sounds.  No murmur heard. No BLE edema. Pulmonary/Chest: Effort normal and breath sounds normal. No respiratory distress. Abdominal: Soft.  There is  tenderness on RUQ but no guarding or rebound, no rashes, normal bowel sounds . Psychiatric: Patient has a normal mood and affect. behavior is normal. Judgment and thought content normal.   Recent Results (from the past 2160 hour(s))  I-STAT creatinine     Status: None   Collection Time: 10/02/20 11:16 AM  Result Value Ref Range   Creatinine, Ser 0.60 0.44 - 1.00 mg/dL  Tox[employer] Select 13 (MW), Urine     Status: None   Collection Time: 10/27/20  1:39 PM  Result Value Ref Range   Summary Note     Comment: ==================================================================== ToxASSU[employer] Select 13 (MW) ==================================================================== Test                             Result       Flag       Units  Drug Present and Declared for Prescription Verification   7-aminoclonazepam              319          EXPECTED   ng/mg creat    7-aminoclonazepam is an expected metabolite of clonazepam. Source of    clonazepam is a scheduled prescription medication.    Oxycodone                      4090         EXPECTED   ng/mg creat   Oxymorphone  2148         EXPECTED   ng/mg creat   Noroxycodone                   3581         EXPECTED   ng/mg creat   Noroxymorphone                 440          EXPECTED   ng/mg creat    Sources of oxycodone are scheduled prescription medications.    Oxymorphone, noroxycodone, and noroxymorphone are expected    metabolites of oxycodone. Oxymorphone is also available as a    scheduled prescription medication.   Drug Absent but Declared for Prescription Verification   Amphetamine                    Not Detected UNEXPECTED ng/mg  creat ==================================================================== Test                      Result    Flag   Units      Ref Range   Creatinine              62               mg/dL      >=20 ==================================================================== Declared Medications:  The flagging and interpretation on this report are based on the  following declared medications.  Unexpected results may arise from  inaccuracies in the declared medications.   **Note: The testing scope of this panel includes these medications:   Amphetamine (Adderall)  Clonazepam (Klonopin)  Oxycodone   **Note: The testing scope of this panel does not include the  following reported medications:   Albuterol (Proair HFA)  Buspirone (Buspar)  Cyclobenzaprine (Flexeril)  Dexlansoprazole (Dexilant)  Hydroxyzine (Atarax)  Lurasidone (Latuda)  Naloxone (Narcan)   Omega-3 Fatty Acids  Sertraline (Zoloft)  Tiotropium (Spiriva)  Trazodone (Desyrel)  Valsartan (Diovan) ==================================================================== For clinical consultation, please call [phone]. ====================================================================       PHQ2/9: Depression screen [employer] 2/9 11/16/2020 10/26/2020 05/14/2020 03/19/2020 01/15/2020  Decreased Interest 0 0 0 1 0  Down, Depressed, Hopeless 0 0 0 1 0  PHQ - 2 Score 0 0 0 2 0  Altered sleeping 2 - - 2 -  Tired, decreased energy 1 - - 1 -  Change in appetite 0 - - 0 -  Feeling bad or failure about yourself  0 - - 0 -  Trouble concentrating 0 - - 0 -  Moving slowly or fidgety/restless 0 - - 0 -  Suicidal thoughts 0 - - 0 -  PHQ-9 Score 3 - - 5 -  Difficult doing work/chores Not difficult at all - - - -  Some recent data might be hidden    phq 9 is positive   Fall Risk: Fall Risk  11/16/2020 10/26/2020 07/13/2020 05/14/2020 03/19/2020  Falls in the past year? 0 0 0 0 1  Number falls in past yr: 0 - - - 0  Injury with Fall? 0 - -  - 1  Comment - - - - Ribs  Risk for fall due to : - - No Fall Risks - -  Follow up Falls evaluation completed - Falls evaluation completed - -  Comment - - - - -     Functional Status Survey: Is the patient deaf or have difficulty hearing?: Yes Does the patient have difficulty seeing, even  when wearing glasses/contacts?: No Does the patient have difficulty concentrating, remembering, or making decisions?: No Does the patient have difficulty walking or climbing stairs?: No Does the patient have difficulty dressing or bathing?: No Does the patient have difficulty doing errands alone such as visiting a doctor's office or shopping?: No    Assessment & Plan  1. RUQ pain  - US ABDOMEN LIMITED RUQ (LIVER/GB); Future - CBC with Differential/Platelet - COMPLETE METABOLIC PANEL WITH GFR  2. Gastroesophageal reflux disease without esophagitis  - dexlansoprazole (DEXILANT) 60 MG capsule; Take 1 capsule (60 mg total) by mouth daily.  Dispense: 90 capsule; Refill: 1  3. COPD, moderate ([place])  - umeclidinium-vilanterol (ANORO ELLIPTA) 62.5-25 MCG/INH AEPB; Inhale 1 puff into the lungs daily at 6 (six) AM. In place of Spiriva  Dispense: 3 each; Refill: 1  4. Hypertension, benign  - valsartan (DIOVAN) 80 MG tablet; Take 1 tablet (80 mg total) by mouth daily.  Dispense: 90 tablet; Refill: 1 - CBC with Differential/Platelet - COMPLETE METABOLIC PANEL WITH GFR  5. Bipolar affective disorder, currently depressed, mild ([place])   6. Primary osteoarthritis of left knee   7. Morbid obesity with BMI of 40.0-44.9, adult [facility])  Discussed with the patient the risk posed by an increased BMI. Discussed importance of portion control, calorie counting and at least 150 minutes of physical activity weekly. Avoid sweet beverages and drink more water. Eat at least 6 servings of fruit and vegetables daily    8. Pain syndrome, chronic   9. Polyarthralgia  - ibuprofen (ADVIL) 800 MG tablet; Take 1 tablet  (800 mg total) by mouth every 8 (eight) hours as needed.  Dispense: 90 tablet; Refill: 0  10. Nausea  - US ABDOMEN LIMITED RUQ (LIVER/GB); Future  11. Need for shingles vaccine  - Zoster Vaccine Adjuvanted [facility]) injection; Inject 0.5 mLs into the muscle once for 1 dose.  Dispense: 0.5 mL; Refill: 1  12. Need for Tdap vaccination  - Tdap (ADACEL) 09-07-13.5 LF-MCG/0.5 injection; Inject 0.5 mLs into the muscle once for 1 dose.  Dispense: 0.5 mL; Refill: 0  13. History of diabetes mellitus  - Hemoglobin A1c  14. Dyslipidemia  - Lipid panel  15. History of alcoholism ([place])

## 2020-11-16 ENCOUNTER — Encounter: Payer: Self-pay | Admitting: Family Medicine

## 2020-11-16 ENCOUNTER — Ambulatory Visit (INDEPENDENT_AMBULATORY_CARE_PROVIDER_SITE_OTHER): Payer: Medicare Other | Admitting: Family Medicine

## 2020-11-16 ENCOUNTER — Other Ambulatory Visit: Payer: Self-pay

## 2020-11-16 VITALS — BP 132/84 | HR 91 | Temp 98.2°F | Resp 18 | Ht 64.0 in | Wt 244.9 lb

## 2020-11-16 DIAGNOSIS — Z8639 Personal history of other endocrine, nutritional and metabolic disease: Secondary | ICD-10-CM | POA: Diagnosis not present

## 2020-11-16 DIAGNOSIS — M1712 Unilateral primary osteoarthritis, left knee: Secondary | ICD-10-CM | POA: Diagnosis not present

## 2020-11-16 DIAGNOSIS — M2550 Pain in unspecified joint: Secondary | ICD-10-CM

## 2020-11-16 DIAGNOSIS — I10 Essential (primary) hypertension: Secondary | ICD-10-CM | POA: Diagnosis not present

## 2020-11-16 DIAGNOSIS — G894 Chronic pain syndrome: Secondary | ICD-10-CM

## 2020-11-16 DIAGNOSIS — Z23 Encounter for immunization: Secondary | ICD-10-CM

## 2020-11-16 DIAGNOSIS — R1011 Right upper quadrant pain: Secondary | ICD-10-CM

## 2020-11-16 DIAGNOSIS — R110 Nausea: Secondary | ICD-10-CM | POA: Diagnosis not present

## 2020-11-16 DIAGNOSIS — J449 Chronic obstructive pulmonary disease, unspecified: Secondary | ICD-10-CM

## 2020-11-16 DIAGNOSIS — E785 Hyperlipidemia, unspecified: Secondary | ICD-10-CM

## 2020-11-16 DIAGNOSIS — K219 Gastro-esophageal reflux disease without esophagitis: Secondary | ICD-10-CM

## 2020-11-16 DIAGNOSIS — F3131 Bipolar disorder, current episode depressed, mild: Secondary | ICD-10-CM

## 2020-11-16 DIAGNOSIS — Z6841 Body Mass Index (BMI) 40.0 and over, adult: Secondary | ICD-10-CM

## 2020-11-16 DIAGNOSIS — F1021 Alcohol dependence, in remission: Secondary | ICD-10-CM

## 2020-11-16 MED ORDER — VALSARTAN 80 MG PO TABS: 80.0000 mg | ORAL_TABLET | Freq: Every day | ORAL | 1 refills | Status: DC

## 2020-11-16 MED ORDER — DEXLANSOPRAZOLE 60 MG PO CPDR: 1.0000 | DELAYED_RELEASE_CAPSULE | Freq: Every day | ORAL | 1 refills | Status: DC

## 2020-11-16 MED ORDER — UMECLIDINIUM-VILANTEROL 62.5-25 MCG/INH IN AEPB: 1.0000 | INHALATION_SPRAY | Freq: Every day | RESPIRATORY_TRACT | 1 refills | Status: DC

## 2020-11-16 MED ORDER — ZOSTER VAC RECOMB ADJUVANTED 50 MCG/0.5ML IM SUSR: 0.5000 mL | Freq: Once | INTRAMUSCULAR | 1 refills | Status: AC

## 2020-11-16 MED ORDER — IBUPROFEN 800 MG PO TABS: 800.0000 mg | ORAL_TABLET | Freq: Three times a day (TID) | ORAL | 0 refills | Status: DC | PRN

## 2020-11-16 MED ORDER — TETANUS-DIPHTH-ACELL PERTUSSIS 5-2-15.5 LF-MCG/0.5 IM SUSP: 0.5000 mL | Freq: Once | INTRAMUSCULAR | 0 refills | Status: AC

## 2020-11-17 LAB — CBC WITH DIFFERENTIAL/PLATELET
Absolute Monocytes: 504 cells/uL (ref 200–950)
Basophils Absolute: 22 cells/uL (ref 0–200)
Basophils Relative: 0.3 %
Eosinophils Absolute: 180 cells/uL (ref 15–500)
Eosinophils Relative: 2.5 %
HCT: 39.8 % (ref 35.0–45.0)
Hemoglobin: 12.8 g/dL (ref 11.7–15.5)
Lymphs Abs: 1850 cells/uL (ref 850–3900)
MCH: 28.1 pg (ref 27.0–33.0)
MCHC: 32.2 g/dL (ref 32.0–36.0)
MCV: 87.3 fL (ref 80.0–100.0)
MPV: 11 fL (ref 7.5–12.5)
Monocytes Relative: 7 %
Neutro Abs: 4644 cells/uL (ref 1500–7800)
Neutrophils Relative %: 64.5 %
Platelets: 226 10*3/uL (ref 140–400)
RBC: 4.56 10*6/uL (ref 3.80–5.10)
RDW: 12.8 % (ref 11.0–15.0)
Total Lymphocyte: 25.7 %
WBC: 7.2 10*3/uL (ref 3.8–10.8)

## 2020-11-17 LAB — COMPLETE METABOLIC PANEL WITH GFR
AG Ratio: 1.7 (calc) (ref 1.0–2.5)
ALT: 12 U/L (ref 6–29)
AST: 13 U/L (ref 10–35)
Albumin: 3.8 g/dL (ref 3.6–5.1)
Alkaline phosphatase (APISO): 77 U/L (ref 37–153)
BUN: 7 mg/dL (ref 7–25)
CO2: 29 mmol/L (ref 20–32)
Calcium: 8.7 mg/dL (ref 8.6–10.4)
Chloride: 102 mmol/L (ref 98–110)
Creat: 0.62 mg/dL (ref 0.50–1.03)
Globulin: 2.2 g/dL (calc) (ref 1.9–3.7)
Glucose, Bld: 108 mg/dL — ABNORMAL HIGH (ref 65–99)
Potassium: 3.9 mmol/L (ref 3.5–5.3)
Sodium: 138 mmol/L (ref 135–146)
Total Bilirubin: 0.2 mg/dL (ref 0.2–1.2)
Total Protein: 6 g/dL — ABNORMAL LOW (ref 6.1–8.1)
eGFR: 106 mL/min/{1.73_m2} (ref >=60)

## 2020-11-17 LAB — LIPID PANEL
Cholesterol: 175 mg/dL (ref <200)
HDL: 39 mg/dL — ABNORMAL LOW (ref >=50)
LDL Cholesterol (Calc): 106 mg/dL (calc) — ABNORMAL HIGH
Non-HDL Cholesterol (Calc): 136 mg/dL (calc) — ABNORMAL HIGH (ref <130)
Total CHOL/HDL Ratio: 4.5 (calc) (ref <5.0)
Triglycerides: 188 mg/dL — ABNORMAL HIGH (ref <150)

## 2020-11-17 LAB — HEMOGLOBIN A1C
Hgb A1c MFr Bld: 4.9 % of total Hgb (ref <5.7)
Mean Plasma Glucose: 94 mg/dL
eAG (mmol/L): 5.2 mmol/L

## 2020-11-25 ENCOUNTER — Ambulatory Visit: Payer: Medicare Other | Attending: Family Medicine

## 2020-11-30 ENCOUNTER — Ambulatory Visit: Payer: Medicare Other | Admitting: Anesthesiology

## 2020-11-30 ENCOUNTER — Ambulatory Visit: Payer: Medicare Other

## 2020-12-04 ENCOUNTER — Emergency Department: Payer: Medicare Other

## 2020-12-04 ENCOUNTER — Emergency Department
Admission: EM | Admit: 2020-12-04 | Discharge: 2020-12-04 | Disposition: A | Discharge status: Home / Self Care | Payer: Medicare Other | Attending: Physician Assistant | Admitting: Physician Assistant

## 2020-12-04 ENCOUNTER — Other Ambulatory Visit: Payer: Self-pay

## 2020-12-04 DIAGNOSIS — M79604 Pain in right leg: Secondary | ICD-10-CM | POA: Diagnosis not present

## 2020-12-04 DIAGNOSIS — Z5321 Procedure and treatment not carried out due to patient leaving prior to being seen by health care provider: Secondary | ICD-10-CM | POA: Diagnosis not present

## 2020-12-04 DIAGNOSIS — Z96651 Presence of right artificial knee joint: Secondary | ICD-10-CM | POA: Diagnosis not present

## 2020-12-04 DIAGNOSIS — M25561 Pain in right knee: Secondary | ICD-10-CM | POA: Diagnosis not present

## 2020-12-04 DIAGNOSIS — Z471 Aftercare following joint replacement surgery: Secondary | ICD-10-CM | POA: Diagnosis not present

## 2020-12-04 DIAGNOSIS — M7989 Other specified soft tissue disorders: Secondary | ICD-10-CM | POA: Diagnosis not present

## 2020-12-04 NOTE — ED Notes (Signed)
Called pt several times no answer  

## 2020-12-04 NOTE — ED Provider Notes (Signed)
Emergency Medicine Provider Triage Evaluation Note  Wendy Johnson , a 55 y.o. female  was evaluated in triage.  Pt complains of right knee pain and swelling. She is s/p TKR x 2 and reports 4 days of swelling. She denies any recent trauma.   Review of Systems  Positive: Right knee pain/swelling Negative: Fevers, chest pain  Physical Exam  LMP 09/26/2014 Comment: Total Gen:   Awake, no distress  NAD Resp:  Normal effort CTA MSK:   Moves extremities without difficulty Right knee with some swelling noted.  Other:    Medical Decision Making  Medically screening exam initiated at 3:59 PM.  Appropriate orders placed.  Shriya Roger Monks was informed that the remainder of the evaluation will be completed by another provider, this initial triage assessment does not replace that evaluation, and the importance of remaining in the ED until their evaluation is complete.  Patient with right knee pain x 4 days.    Melvenia Needles, PA-C 12/04/20 1602    Vladimir Crofts, MD 12/04/20 [phone]

## 2020-12-04 NOTE — ED Triage Notes (Signed)
Pt presents via POV c/o right leg pain worsening x1-2 weeks. Pt has hx of multiple surgeries to right leg. Denies acute injury. Thayer Headings PA assessing in triage.

## 2020-12-09 DIAGNOSIS — T8484XA Pain due to internal orthopedic prosthetic devices, implants and grafts, initial encounter: Secondary | ICD-10-CM | POA: Diagnosis not present

## 2020-12-09 DIAGNOSIS — Z96651 Presence of right artificial knee joint: Secondary | ICD-10-CM | POA: Diagnosis not present

## 2020-12-09 DIAGNOSIS — M25561 Pain in right knee: Secondary | ICD-10-CM | POA: Diagnosis not present

## 2020-12-09 DIAGNOSIS — G8929 Other chronic pain: Secondary | ICD-10-CM | POA: Diagnosis not present

## 2020-12-14 ENCOUNTER — Ambulatory Visit
Admission: RE | Admit: 2020-12-14 | Discharge: 2020-12-14 | Disposition: A | Discharge status: Home / Self Care | Payer: Medicare Other | Source: Ambulatory Visit | Attending: Anesthesiology | Admitting: Anesthesiology

## 2020-12-14 ENCOUNTER — Other Ambulatory Visit: Payer: Self-pay

## 2020-12-14 ENCOUNTER — Encounter: Payer: Self-pay | Admitting: Anesthesiology

## 2020-12-14 ENCOUNTER — Ambulatory Visit (HOSPITAL_BASED_OUTPATIENT_CLINIC_OR_DEPARTMENT_OTHER): Payer: Medicare Other | Admitting: Anesthesiology

## 2020-12-14 ENCOUNTER — Other Ambulatory Visit: Payer: Self-pay | Admitting: Anesthesiology

## 2020-12-14 VITALS — BP 166/105 | HR 92 | Temp 97.2°F | Resp 18 | Ht 64.0 in | Wt 232.0 lb

## 2020-12-14 DIAGNOSIS — M5136 Other intervertebral disc degeneration, lumbar region: Secondary | ICD-10-CM | POA: Insufficient documentation

## 2020-12-14 DIAGNOSIS — M47816 Spondylosis without myelopathy or radiculopathy, lumbar region: Secondary | ICD-10-CM | POA: Insufficient documentation

## 2020-12-14 DIAGNOSIS — M25552 Pain in left hip: Secondary | ICD-10-CM | POA: Diagnosis not present

## 2020-12-14 DIAGNOSIS — M5431 Sciatica, right side: Secondary | ICD-10-CM

## 2020-12-14 DIAGNOSIS — R52 Pain, unspecified: Secondary | ICD-10-CM

## 2020-12-14 DIAGNOSIS — M542 Cervicalgia: Secondary | ICD-10-CM

## 2020-12-14 DIAGNOSIS — M25551 Pain in right hip: Secondary | ICD-10-CM

## 2020-12-14 DIAGNOSIS — M545 Low back pain, unspecified: Secondary | ICD-10-CM

## 2020-12-14 DIAGNOSIS — G894 Chronic pain syndrome: Secondary | ICD-10-CM | POA: Diagnosis not present

## 2020-12-14 DIAGNOSIS — F1190 Opioid use, unspecified, uncomplicated: Secondary | ICD-10-CM | POA: Insufficient documentation

## 2020-12-14 MED ORDER — TRIAMCINOLONE ACETONIDE 40 MG/ML IJ SUSP
40.0000 mg | Freq: Once | INTRAMUSCULAR | Status: AC
  Administered 2020-12-14: 40 mg

## 2020-12-14 MED ORDER — TRIAMCINOLONE ACETONIDE 40 MG/ML IJ SUSP
INTRAMUSCULAR | Status: AC
  Filled 2020-12-14: qty 1

## 2020-12-14 MED ORDER — SODIUM CHLORIDE (PF) 0.9 % IJ SOLN
INTRAMUSCULAR | Status: AC
  Filled 2020-12-14: qty 10

## 2020-12-14 MED ORDER — ROPIVACAINE HCL 2 MG/ML IJ SOLN
10.0000 mL | Freq: Once | INTRAMUSCULAR | Status: AC
  Administered 2020-12-14: 10 mL via EPIDURAL

## 2020-12-14 MED ORDER — SODIUM CHLORIDE 0.9% FLUSH
10.0000 mL | Freq: Once | INTRAVENOUS | Status: AC
  Administered 2020-12-14: 10 mL

## 2020-12-14 MED ORDER — IOHEXOL 180 MG/ML  SOLN
10.0000 mL | Freq: Once | INTRAMUSCULAR | Status: AC | PRN
  Administered 2020-12-14: 5 mL via EPIDURAL

## 2020-12-14 MED ORDER — LIDOCAINE HCL (PF) 1 % IJ SOLN
INTRAMUSCULAR | Status: AC
  Filled 2020-12-14: qty 10

## 2020-12-14 MED ORDER — LIDOCAINE HCL (PF) 1 % IJ SOLN
5.0000 mL | Freq: Once | INTRAMUSCULAR | Status: AC
  Administered 2020-12-14: 5 mL via SUBCUTANEOUS

## 2020-12-14 MED ORDER — CELECOXIB 200 MG PO CAPS: 200.0000 mg | ORAL_CAPSULE | Freq: Every day | ORAL | 3 refills | Status: AC

## 2020-12-14 MED ORDER — OXYCODONE HCL 10 MG PO TABS: 10.0000 mg | ORAL_TABLET | Freq: Every day | ORAL | 0 refills | Status: AC | PRN

## 2020-12-14 MED ORDER — ROPIVACAINE HCL 2 MG/ML IJ SOLN
INTRAMUSCULAR | Status: AC
  Filled 2020-12-14: qty 20

## 2020-12-14 MED ORDER — OXYCODONE HCL 10 MG PO TABS: 10.0000 mg | ORAL_TABLET | Freq: Every day | ORAL | 0 refills | Status: DC | PRN

## 2020-12-14 NOTE — Patient Instructions (Signed)
Pain Management Discharge Instructions  General Discharge Instructions :  If you need to reach your doctor call: Monday-Friday 8:00 am - 4:00 pm at [phone] or toll free [phone].  After clinic hours [phone] to have operator reach doctor.  Bring all of your medication bottles to all your appointments in the pain clinic.  To cancel or reschedule your appointment with Pain Management please remember to call 24 hours in advance to avoid a fee.  Refer to the educational materials which you have been given on: General Risks, I had my Procedure. Discharge Instructions, Post Sedation.  Post Procedure Instructions:  The drugs you were given will stay in your system until tomorrow, so for the next 24 hours you should not drive, make any legal decisions or drink any alcoholic beverages.  You may eat anything you prefer, but it is better to start with liquids then soups and crackers, and gradually work up to solid foods.  Please notify your doctor immediately if you have any unusual bleeding, trouble breathing or pain that is not related to your normal pain.  Depending on the type of procedure that was done, some parts of your body may feel week and/or numb.  This usually clears up by tonight or the next day.  Walk with the use of an assistive device or accompanied by an adult for the 24 hours.  You may use ice on the affected area for the first 24 hours.  Put ice in a Ziploc bag and cover with a towel and place against area 15 minutes on 15 minutes off.  You may switch to heat after 24 hours.Epidural Steroid Injection Patient Information  Description: The epidural space surrounds the nerves as they exit the spinal cord.  In some patients, the nerves can be compressed and inflamed by a bulging disc or a tight spinal canal (spinal stenosis).  By injecting steroids into the epidural space, we can bring irritated nerves into direct contact with a potentially helpful medication.  These  steroids act directly on the irritated nerves and can reduce swelling and inflammation which often leads to decreased pain.  Epidural steroids may be injected anywhere along the spine and from the neck to the low back depending upon the location of your pain.   After numbing the skin with local anesthetic (like Novocaine), a small needle is passed into the epidural space slowly.  You may experience a sensation of pressure while this is being done.  The entire block usually last less than 10 minutes.  Conditions which may be treated by epidural steroids:  Low back and leg pain Neck and arm pain Spinal stenosis Post-laminectomy syndrome Herpes zoster (shingles) pain Pain from compression fractures  Preparation for the injection:  Do not eat any solid food or dairy products within 8 hours of your appointment.  You may drink clear liquids up to 3 hours before appointment.  Clear liquids include water, black coffee, juice or soda.  No milk or cream please. You may take your regular medication, including pain medications, with a sip of water before your appointment  Diabetics should hold regular insulin (if taken separately) and take 1/2 normal NPH dos the morning of the procedure.  Carry some sugar containing items with you to your appointment. A driver must accompany you and be prepared to drive you home after your procedure.  Bring all your current medications with your. An IV may be inserted and sedation may be given at the discretion of the physician.     A blood pressure cuff, EKG and other monitors will often be applied during the procedure.  Some patients may need to have extra oxygen administered for a short period. You will be asked to provide medical information, including your allergies, prior to the procedure.  We must know immediately if you are taking blood thinners (like Coumadin/Warfarin)  Or if you are allergic to IV iodine contrast (dye). We must know if you could possible be  pregnant.  Possible side-effects: Bleeding from needle site Infection (rare, may require surgery) Nerve injury (rare) Numbness & tingling (temporary) Difficulty urinating (rare, temporary) Spinal headache ( a headache worse with upright posture) Light -headedness (temporary) Pain at injection site (several days) Decreased blood pressure (temporary) Weakness in arm/leg (temporary) Pressure sensation in back/neck (temporary)  Call if you experience: Fever/chills associated with headache or increased back/neck pain. Headache worsened by an upright position. New onset weakness or numbness of an extremity below the injection site Hives or difficulty breathing (go to the emergency room) Inflammation or drainage at the infection site Severe back/neck pain Any new symptoms which are concerning to you  Please note:  Although the local anesthetic injected can often make your back or neck feel good for several hours after the injection, the pain will likely return.  It takes 3-7 days for steroids to work in the epidural space.  You may not notice any pain relief for at least that one week.  If effective, we will often do a series of three injections spaced 3-6 weeks apart to maximally decrease your pain.  After the initial series, we generally will wait several months before considering a repeat injection of the same type.  If you have any questions, please call [phone] [facility] 

## 2020-12-15 NOTE — Progress Notes (Signed)
Subjective:  Patient ID: Wendy Johnson, female    DOB: [birth year]  Age: 55 y.o. MRN: [id number]  CC: Back Pain and Knee Pain (right)   Procedure: L5-S1 epidural steroid under fluoroscopic guidance with no sedation  HPI Wendy Johnson presents for reevaluation.  Wendy Johnson continues to have intermittent sciatica symptoms that have been worse recently.  She has epidurals for these periodically and presents today for an epidural.  She typically gets sciatica that is worse in the right posterior lateral leg more intense than on the left side.  She also has hip pain.  She is got a history of chronic knee pain and this is followed by Dr. Rudene Johnson and she is scheduled for further evaluation with this soon.  She takes her medications effectively and these continue to give her good relief rated approximately 75 to 80% relief.  She generally gets 3 to 4 hours of relief and then has breakthrough pain.  She is taking her oxycodone 5 times a day.  Otherwise the pain in her low back and hip and knees is persistent.  Unfortunately she has severe persistent pain from her diffuse osteoarthritis.  No other change in her pain regimen or syndrome are noted today.  She generally reports approximately 75% improvement in her low back pain for about 4 to 6 weeks following her epidural injections before she gets a gradual recurrence of the same pain.  Outpatient Medications Prior to Visit  Medication Sig Dispense Refill   albuterol (PROVENTIL) (2.5 MG/3ML) 0.083% nebulizer solution USE 1 VIAL (3ML) BY NEBULIZATION EVERY 6HOURS AS NEEDED FOR WHEEZING OR SHORTNESS OF BREATH 30 mL 0   amphetamine-dextroamphetamine (ADDERALL) 15 MG tablet Take 15 mg by mouth daily.      amphetamine-dextroamphetamine (ADDERALL) 30 MG tablet Take 30 mg by mouth 2 (two) times daily.     busPIRone (BUSPAR) 30 MG tablet Take 30 mg by mouth 2 (two) times daily.      clonazePAM (KLONOPIN) 1 MG tablet Take 1 mg by mouth 6 (six) times daily.       cyclobenzaprine (FLEXERIL) 10 MG tablet Take 10 mg by mouth 3 (three) times daily.     dexlansoprazole (DEXILANT) 60 MG capsule Take 1 capsule (60 mg total) by mouth daily. 90 capsule 1   hydrOXYzine (ATARAX/VISTARIL) 50 MG tablet Take 50 mg by mouth as needed.     ibuprofen (ADVIL) 800 MG tablet Take 1 tablet (800 mg total) by mouth every 8 (eight) hours as needed. 90 tablet 0   LATUDA 80 MG TABS tablet Take 80 mg by mouth at bedtime.     naloxone (NARCAN) nasal spray 4 mg/0.1 mL As directed for opioid induced respiratory depression 1 each 1   naloxone (NARCAN) nasal spray 4 mg/0.1 mL As directed for opioid induced respiratory depression 1 each 1   omega-3 acid ethyl esters (LOVAZA) 1 g capsule Take 2 capsules (2 g total) by mouth 2 (two) times daily. 360 capsule 1   PROAIR HFA 108 (90 Base) MCG/ACT inhaler INHALE 2 PUFFS BY MOUTH 4 TIMES A DAY 8.5 g 0   sertraline (ZOLOFT) 100 MG tablet Take 100 mg by mouth 2 (two) times daily.     traZODone (DESYREL) 100 MG tablet Take 400 mg by mouth at bedtime.      umeclidinium-vilanterol (ANORO ELLIPTA) 62.5-25 MCG/INH AEPB Inhale 1 puff into the lungs daily at 6 (six) AM. In place of Spiriva 3 each 1   valsartan (DIOVAN) 80 MG tablet  Take 1 tablet (80 mg total) by mouth daily. 90 tablet 1   No facility-administered medications prior to visit.    Review of Systems CNS: No confusion or sedation Cardiac: No angina or palpitations GI: No abdominal pain or constipation Constitutional: No nausea vomiting fevers or chills  Objective:  BP (!) 166/105   Pulse 92   Temp (!) 97.2 F (36.2 C)   Resp 18   Ht '5\' 4"'$  (1.626 m)   Wt 232 lb (105.2 kg)   LMP 09/26/2014 Comment: Total  SpO2 95%   BMI 39.82 kg/m    BP Readings from Last 3 Encounters:  12/14/20 (!) 166/105  12/04/20 137/90  11/16/20 132/84     Wt Readings from Last 3 Encounters:  12/14/20 232 lb (105.2 kg)  11/16/20 244 lb 14.4 oz (111.1 kg)  10/26/20 232 lb (105.2 kg)      Physical Exam Pt is alert and oriented PERRL EOMI HEART IS RRR no murmur or rub LCTA no wheezing or rales MUSCULOSKELETAL reveals some paraspinous muscle tenderness but no overt trigger points.  She does have a positive straight leg raise on the right side negative on the left.  She does have some pain on extension while in the standing position.  We have reviewed her MRI once again with her today.  She walks with an antalgic gait.  Labs  Lab Results  Component Value Date   HGBA1C 4.9 11/16/2020   HGBA1C 5.3 06/04/2019   HGBA1C 5.3 10/04/2018   Lab Results  Component Value Date   MICROALBUR 0.2 06/04/2019   LDLCALC 106 (H) 11/16/2020   CREATININE 0.62 11/16/2020    -------------------------------------------------------------------------------------------------------------------- Lab Results  Component Value Date   WBC 7.2 11/16/2020   HGB 12.8 11/16/2020   HCT 39.8 11/16/2020   PLT 226 11/16/2020   GLUCOSE 108 (H) 11/16/2020   CHOL 175 11/16/2020   TRIG 188 (H) 11/16/2020   HDL 39 (L) 11/16/2020   LDLCALC 106 (H) 11/16/2020   ALT 12 11/16/2020   AST 13 11/16/2020   NA 138 11/16/2020   K 3.9 11/16/2020   CL 102 11/16/2020   CREATININE 0.62 11/16/2020   BUN 7 11/16/2020   CO2 29 11/16/2020   TSH 0.769 02/03/2016   INR 0.97 10/04/2016   HGBA1C 4.9 11/16/2020   MICROALBUR 0.2 06/04/2019    --------------------------------------------------------------------------------------------------------------------- DG PAIN CLINIC C-ARM 1-60 MIN NO REPORT  Result Date: 12/14/2020 Fluoro was used, but no Radiologist interpretation will be provided. Please refer to "NOTES" tab for provider progress note.    Assessment & Plan:   Wendy Johnson was seen today for back pain and knee pain.  Diagnoses and all orders for this visit:  Chronic pain syndrome  DDD (degenerative disc disease), lumbar  Low back pain at multiple sites  Sciatica, right side  Facet arthritis of  lumbar region  Chronic, continuous use of opioids  Pain of both hip joints  Cervicalgia  Other orders -     Oxycodone HCl 10 MG TABS; Take 1 tablet (10 mg total) by mouth 5 (five) times daily as needed. -     Oxycodone HCl 10 MG TABS; Take 1 tablet (10 mg total) by mouth 5 (five) times daily as needed. -     celecoxib (CELEBREX) 200 MG capsule; Take 1 capsule (200 mg total) by mouth daily. -     triamcinolone acetonide (KENALOG-40) injection 40 mg -     sodium chloride flush (NS) 0.9 % injection 10 mL -  ropivacaine (PF) 2 mg/mL (0.2%) (NAROPIN) injection 10 mL -     lidocaine (PF) (XYLOCAINE) 1 % injection 5 mL -     iohexol (OMNIPAQUE) 180 MG/ML injection 10 mL        ----------------------------------------------------------------------------------------------------------------------  Problem List Items Addressed This Visit       Unprioritized   DDD (degenerative disc disease), lumbar   Relevant Medications   Oxycodone HCl 10 MG TABS   Oxycodone HCl 10 MG TABS (Start on 01/13/2021)   celecoxib (CELEBREX) 200 MG capsule   Facet arthritis of lumbar region   Relevant Medications   Oxycodone HCl 10 MG TABS   Oxycodone HCl 10 MG TABS (Start on 01/13/2021)   celecoxib (CELEBREX) 200 MG capsule   Sciatica, right side   Other Visit Diagnoses     Chronic pain syndrome    -  Primary   Relevant Medications   Oxycodone HCl 10 MG TABS   Oxycodone HCl 10 MG TABS (Start on 01/13/2021)   celecoxib (CELEBREX) 200 MG capsule   triamcinolone acetonide (KENALOG-40) injection 40 mg (Completed)   ropivacaine (PF) 2 mg/mL (0.2%) (NAROPIN) injection 10 mL (Completed)   lidocaine (PF) (XYLOCAINE) 1 % injection 5 mL (Completed)   Low back pain at multiple sites       Relevant Medications   Oxycodone HCl 10 MG TABS   Oxycodone HCl 10 MG TABS (Start on 01/13/2021)   celecoxib (CELEBREX) 200 MG capsule   triamcinolone acetonide (KENALOG-40) injection 40 mg (Completed)   Chronic, continuous  use of opioids       Pain of both hip joints       Cervicalgia             ----------------------------------------------------------------------------------------------------------------------  1. Chronic pain syndrome Will continue on her current pain management protocol with opioid medications dated for the next 2 months.  No changes will be made.  She does report that she has gotten some medications from Dr. Rudene Johnson secondary to the acute right hip pain that she had had recently  2. DDD (degenerative disc disease), lumbar We will proceed with a repeat epidural today.  We gone over the risks and benefits of the procedure with her in full detail and all questions were answered.  3. Low back pain at multiple sites As above  4. Sciatica, right side As above  5. Facet arthritis of lumbar region Continue core stretching strengthening exercises and efforts at weight loss as reviewed today.  6. Chronic, continuous use of opioids I have reviewed the [facility] practitioner database information and we will present with refills for the next 2 months.  No other changes will be made today.  7. Pain of both hip joints   8. Cervicalgia     ----------------------------------------------------------------------------------------------------------------------  I am having Nobie Putnam. Scobie "Wendy Johnson" start on Oxycodone HCl, Oxycodone HCl, and celecoxib. I am also having her maintain her amphetamine-dextroamphetamine, busPIRone, sertraline, clonazePAM, traZODone, amphetamine-dextroamphetamine, cyclobenzaprine, Latuda, ProAir HFA, naloxone, omega-3 acid ethyl esters, naloxone, hydrOXYzine, albuterol, valsartan, ibuprofen, dexlansoprazole, and umeclidinium-vilanterol. We administered triamcinolone acetonide, sodium chloride flush, ropivacaine (PF) 2 mg/mL (0.2%), lidocaine (PF), and iohexol.   Meds ordered this encounter  Medications   Oxycodone HCl 10 MG TABS    Sig: Take 1 tablet (10 mg  total) by mouth 5 (five) times daily as needed.    Dispense:  150 tablet    Refill:  0   Oxycodone HCl 10 MG TABS    Sig: Take 1 tablet (10 mg total)  by mouth 5 (five) times daily as needed.    Dispense:  150 tablet    Refill:  0   celecoxib (CELEBREX) 200 MG capsule    Sig: Take 1 capsule (200 mg total) by mouth daily.    Dispense:  30 capsule    Refill:  3   triamcinolone acetonide (KENALOG-40) injection 40 mg   sodium chloride flush (NS) 0.9 % injection 10 mL   ropivacaine (PF) 2 mg/mL (0.2%) (NAROPIN) injection 10 mL   lidocaine (PF) (XYLOCAINE) 1 % injection 5 mL   iohexol (OMNIPAQUE) 180 MG/ML injection 10 mL   Patient's Medications  New Prescriptions   CELECOXIB (CELEBREX) 200 MG CAPSULE    Take 1 capsule (200 mg total) by mouth daily.   OXYCODONE HCL 10 MG TABS    Take 1 tablet (10 mg total) by mouth 5 (five) times daily as needed.   OXYCODONE HCL 10 MG TABS    Take 1 tablet (10 mg total) by mouth 5 (five) times daily as needed.  Previous Medications   ALBUTEROL (PROVENTIL) (2.5 MG/3ML) 0.083% NEBULIZER SOLUTION    USE 1 VIAL (3ML) BY NEBULIZATION EVERY 6HOURS AS NEEDED FOR WHEEZING OR SHORTNESS OF BREATH   AMPHETAMINE-DEXTROAMPHETAMINE (ADDERALL) 15 MG TABLET    Take 15 mg by mouth daily.    AMPHETAMINE-DEXTROAMPHETAMINE (ADDERALL) 30 MG TABLET    Take 30 mg by mouth 2 (two) times daily.   BUSPIRONE (BUSPAR) 30 MG TABLET    Take 30 mg by mouth 2 (two) times daily.    CLONAZEPAM (KLONOPIN) 1 MG TABLET    Take 1 mg by mouth 6 (six) times daily.    CYCLOBENZAPRINE (FLEXERIL) 10 MG TABLET    Take 10 mg by mouth 3 (three) times daily.   DEXLANSOPRAZOLE (DEXILANT) 60 MG CAPSULE    Take 1 capsule (60 mg total) by mouth daily.   HYDROXYZINE (ATARAX/VISTARIL) 50 MG TABLET    Take 50 mg by mouth as needed.   IBUPROFEN (ADVIL) 800 MG TABLET    Take 1 tablet (800 mg total) by mouth every 8 (eight) hours as needed.   LATUDA 80 MG TABS TABLET    Take 80 mg by mouth at bedtime.   NALOXONE  (NARCAN) NASAL SPRAY 4 MG/0.1 ML    As directed for opioid induced respiratory depression   NALOXONE (NARCAN) NASAL SPRAY 4 MG/0.1 ML    As directed for opioid induced respiratory depression   OMEGA-3 ACID ETHYL ESTERS (LOVAZA) 1 G CAPSULE    Take 2 capsules (2 g total) by mouth 2 (two) times daily.   PROAIR HFA 108 (90 BASE) MCG/ACT INHALER    INHALE 2 PUFFS BY MOUTH 4 TIMES A DAY   SERTRALINE (ZOLOFT) 100 MG TABLET    Take 100 mg by mouth 2 (two) times daily.   TRAZODONE (DESYREL) 100 MG TABLET    Take 400 mg by mouth at bedtime.    UMECLIDINIUM-VILANTEROL (ANORO ELLIPTA) 62.5-25 MCG/INH AEPB    Inhale 1 puff into the lungs daily at 6 (six) AM. In place of Spiriva   VALSARTAN (DIOVAN) 80 MG TABLET    Take 1 tablet (80 mg total) by mouth daily.  Modified Medications   No medications on file  Discontinued Medications   No medications on file   ----------------------------------------------------------------------------------------------------------------------  Follow-up: Return in about 2 months (around 02/13/2021) for evaluation, med refill.   Procedure: L5-S1 LESI with fluoroscopic guidance and no moderate sedation  NOTE: The risks, benefits, and expectations  of the procedure have been discussed and explained to the patient who was understanding and in agreement with suggested treatment plan. No guarantees were made.  DESCRIPTION OF PROCEDURE: Lumbar epidural steroid injection with no IV Versed, EKG, blood pressure, pulse, and pulse oximetry monitoring. The procedure was performed with the patient in the prone position under fluoroscopic guidance.  Sterile prep x3 was initiated and I then injected subcutaneous lidocaine to the overlying L5-S1 site after its fluoroscopic identifictation.  Using strict aseptic technique, I then advanced an 18-gauge Tuohy epidural needle in the midline using interlaminar approach via loss-of-resistance to saline technique. There was negative aspiration for heme  or  CSF.  I then confirmed position with both AP and Lateral fluoroscan.  2 cc of contrast dye were injected and a  total of 5 mL of Preservative-Free normal saline mixed with 40 mg of Kenalog and 1cc Ropicaine 0.2 percent were injected incrementally via the  epidurally placed needle. The needle was removed. The patient tolerated the injection well and was convalesced and discharged to home in stable condition. Should the patient have any post procedure difficulty they have been instructed on how to contact us for assistance.    Molli Barrows, MD

## 2020-12-17 ENCOUNTER — Other Ambulatory Visit: Payer: Self-pay | Admitting: Orthopedic Surgery

## 2020-12-17 DIAGNOSIS — Z96651 Presence of right artificial knee joint: Secondary | ICD-10-CM

## 2020-12-17 DIAGNOSIS — G8929 Other chronic pain: Secondary | ICD-10-CM

## 2020-12-30 ENCOUNTER — Encounter
Admission: RE | Admit: 2020-12-30 | Discharge: 2020-12-30 | Disposition: A | Discharge status: Home / Self Care | Payer: Medicare Other | Source: Ambulatory Visit | Attending: Orthopedic Surgery | Admitting: Orthopedic Surgery

## 2020-12-30 ENCOUNTER — Other Ambulatory Visit: Payer: Self-pay

## 2020-12-30 DIAGNOSIS — M25561 Pain in right knee: Secondary | ICD-10-CM | POA: Diagnosis not present

## 2020-12-30 DIAGNOSIS — G8929 Other chronic pain: Secondary | ICD-10-CM | POA: Diagnosis not present

## 2020-12-30 DIAGNOSIS — Z96651 Presence of right artificial knee joint: Secondary | ICD-10-CM | POA: Insufficient documentation

## 2020-12-30 DIAGNOSIS — R948 Abnormal results of function studies of other organs and systems: Secondary | ICD-10-CM | POA: Diagnosis not present

## 2020-12-30 MED ORDER — TECHNETIUM TC 99M MEDRONATE IV KIT
23.9000 | PACK | Freq: Once | INTRAVENOUS | Status: AC | PRN
  Administered 2020-12-30: 23.9 via INTRAVENOUS

## 2020-12-31 ENCOUNTER — Other Ambulatory Visit: Payer: Self-pay | Admitting: Family Medicine

## 2020-12-31 DIAGNOSIS — M2550 Pain in unspecified joint: Secondary | ICD-10-CM

## 2020-12-31 MED ORDER — IBUPROFEN 800 MG PO TABS: 800.0000 mg | ORAL_TABLET | Freq: Three times a day (TID) | ORAL | 0 refills | Status: DC | PRN

## 2020-12-31 NOTE — Telephone Encounter (Signed)
Copied from [place] [id number]. Topic: Quick Communication - Rx Refill/Question >> Dec 31, 2020  3:20 PM Lenon Curt, [place] A wrote: Medication: ibuprofen (ADVIL) 800 MG tablet [id number]   Has the patient contacted their pharmacy? Yes.   (Agent: If no, request that the patient contact the pharmacy for the refill.) (Agent: If yes, when and what did the pharmacy advise?)  Preferred Pharmacy (with phone number or street name): [facility], [place]  Phone:  [phone] Fax:  [phone]   Agent: Please be advised that RX refills may take up to 3 business days. We ask that you follow-up with your pharmacy.

## 2021-01-06 DIAGNOSIS — M25561 Pain in right knee: Secondary | ICD-10-CM | POA: Diagnosis not present

## 2021-01-06 DIAGNOSIS — G8929 Other chronic pain: Secondary | ICD-10-CM | POA: Diagnosis not present

## 2021-01-06 DIAGNOSIS — T8484XA Pain due to internal orthopedic prosthetic devices, implants and grafts, initial encounter: Secondary | ICD-10-CM | POA: Diagnosis not present

## 2021-01-06 DIAGNOSIS — Z96651 Presence of right artificial knee joint: Secondary | ICD-10-CM | POA: Diagnosis not present

## 2021-01-27 ENCOUNTER — Other Ambulatory Visit: Payer: Self-pay | Admitting: Orthopedic Surgery

## 2021-02-03 ENCOUNTER — Other Ambulatory Visit: Payer: Self-pay

## 2021-02-03 ENCOUNTER — Encounter
Admission: RE | Admit: 2021-02-03 | Discharge: 2021-02-03 | Disposition: A | Discharge status: Home / Self Care | Payer: Medicare Other | Source: Ambulatory Visit | Attending: Orthopedic Surgery | Admitting: Orthopedic Surgery

## 2021-02-03 DIAGNOSIS — Z01818 Encounter for other preprocedural examination: Secondary | ICD-10-CM | POA: Diagnosis not present

## 2021-02-03 DIAGNOSIS — Z01810 Encounter for preprocedural cardiovascular examination: Secondary | ICD-10-CM | POA: Diagnosis not present

## 2021-02-03 LAB — COMPREHENSIVE METABOLIC PANEL
ALT: 18 U/L (ref 0–44)
AST: 20 U/L (ref 15–41)
Albumin: 4.2 g/dL (ref 3.5–5.0)
Alkaline Phosphatase: 75 U/L (ref 38–126)
Anion gap: 11 (ref 5–15)
BUN: 9 mg/dL (ref 6–20)
CO2: 24 mmol/L (ref 22–32)
Calcium: 9.1 mg/dL (ref 8.9–10.3)
Chloride: 101 mmol/L (ref 98–111)
Creatinine, Ser: 0.72 mg/dL (ref 0.44–1.00)
GFR, Estimated: >60 mL/min (ref >60)
Glucose, Bld: 84 mg/dL (ref 70–99)
Potassium: 4 mmol/L (ref 3.5–5.1)
Sodium: 136 mmol/L (ref 135–145)
Total Bilirubin: 0.6 mg/dL (ref 0.3–1.2)
Total Protein: 7.7 g/dL (ref 6.5–8.1)

## 2021-02-03 LAB — TYPE AND SCREEN
ABO/RH(D): A POS
Antibody Screen: NEGATIVE

## 2021-02-03 LAB — CBC WITH DIFFERENTIAL/PLATELET
Abs Immature Granulocytes: 0.03 10*3/uL (ref 0.00–0.07)
Basophils Absolute: 0.1 10*3/uL (ref 0.0–0.1)
Basophils Relative: 1 %
Eosinophils Absolute: 0.2 10*3/uL (ref 0.0–0.5)
Eosinophils Relative: 3 %
HCT: 45.6 % (ref 36.0–46.0)
Hemoglobin: 15.7 g/dL — ABNORMAL HIGH (ref 12.0–15.0)
Immature Granulocytes: 0 %
Lymphocytes Relative: 23 %
Lymphs Abs: 2 10*3/uL (ref 0.7–4.0)
MCH: 29.6 pg (ref 26.0–34.0)
MCHC: 34.4 g/dL (ref 30.0–36.0)
MCV: 85.9 fL (ref 80.0–100.0)
Monocytes Absolute: 0.5 10*3/uL (ref 0.1–1.0)
Monocytes Relative: 6 %
Neutro Abs: 5.9 10*3/uL (ref 1.7–7.7)
Neutrophils Relative %: 67 %
Platelets: 280 10*3/uL (ref 150–400)
RBC: 5.31 MIL/uL — ABNORMAL HIGH (ref 3.87–5.11)
RDW: 14 % (ref 11.5–15.5)
WBC: 8.7 10*3/uL (ref 4.0–10.5)
nRBC: 0 % (ref 0.0–0.2)

## 2021-02-03 LAB — URINALYSIS, ROUTINE W REFLEX MICROSCOPIC
Bilirubin Urine: NEGATIVE
Glucose, UA: NEGATIVE mg/dL
Hgb urine dipstick: NEGATIVE
Ketones, ur: NEGATIVE mg/dL
Leukocytes,Ua: NEGATIVE
Nitrite: NEGATIVE
Protein, ur: NEGATIVE mg/dL
Specific Gravity, Urine: 1.014 (ref 1.005–1.030)
pH: 5 (ref 5.0–8.0)

## 2021-02-03 NOTE — Patient Instructions (Addendum)
Your procedure is scheduled on: 02/11/2021 Report to the Registration Desk on the 1st floor of the [facility]. To find out your arrival time, please call [phone] between 1PM - 3PM on: 02/10/2021  REMEMBER: Instructions that are not followed completely may result in serious medical risk, up to and including death; or upon the discretion of your surgeon and anesthesiologist your surgery may need to be rescheduled.  Do not eat food after midnight the night before surgery.  No gum chewing, lozengers or hard candies.  You may however, drink water up to 2 hours before you are scheduled to arrive for your surgery. Do not drink anything within 2 hours of your scheduled arrival time.  In addition, your doctor has ordered for you to drink the provided  Ensure Pre-Surgery Clear Carbohydrate Drink Drinking this carbohydrate drink up to two hours before surgery helps to reduce insulin resistance and improve patient outcomes. Please complete drinking 2 hours prior to scheduled arrival time.  TAKE THESE MEDICATIONS THE MORNING OF SURGERY WITH A SIP OF WATER: - busPIRone (BUSPAR) 30 MG - LATUDA 80 MG  - PROAIR HFA 108 (90 Base) MCG/ACT inhaler - sertraline (ZOLOFT) 100 MG - umeclidinium-vilanterol (ANORO ELLIPTA) 62.5-25 MCG/INH AEPB - Oxycodone HCl 10 MG - if needed - clonazePAM (KLONOPIN) 1 MG - if needed - hydrOXYzine (ATARAX/VISTARIL) 50 MG - if needed - dexlansoprazole (DEXILANT) 60 MG (take one the night before and one on the morning of surgery - helps to prevent nausea after surgery.)  Use inhalers on the day of surgery and bring to the hospital.   One week prior to surgery: Stop Anti-inflammatories (NSAIDS) such as Advil, Aleve, Ibuprofen, Motrin, Naproxen, Naprosyn and Aspirin based products such as Excedrin, Goodys Powder, BC Powder. You may however, continue to take Tylenol if needed for pain up until the day of surgery. Stop ANY OVER THE COUNTER vitamins and supplements until  after surgery.   No Alcohol for 24 hours before or after surgery.  No Smoking including e-cigarettes for 24 hours prior to surgery.  No chewable tobacco products for at least 6 hours prior to surgery.  No nicotine patches on the day of surgery.  Do not use any "recreational" drugs for at least a week prior to your surgery.  Please be advised that the combination of cocaine and anesthesia may have negative outcomes, up to and including death. If you test positive for cocaine, your surgery will be cancelled.  On the morning of surgery brush your teeth with toothpaste and water, you may rinse your mouth with mouthwash if you wish. Do not swallow any toothpaste or mouthwash.  Use CHG Soap or wipes as directed on instruction sheet.  Do not wear jewelry, make-up, hairpins, clips or nail polish.  Do not wear lotions, powders, or perfumes.   Do not shave body from the neck down 48 hours prior to surgery just in case you cut yourself which could leave a site for infection.  Also, freshly shaved skin may become irritated if using the CHG soap.  Contact lenses, hearing aids and dentures may not be worn into surgery.  Do not bring valuables to the hospital. [facility] is not responsible for any missing/lost belongings or valuables.   Notify your doctor if there is any change in your medical condition (cold, fever, infection).  Wear comfortable clothing (specific to your surgery type) to the hospital.  After surgery, you can help prevent lung complications by doing breathing exercises.  Take deep breaths  and cough every 1-2 hours. Your doctor may order a device called an Incentive Spirometer to help you take deep breaths. When coughing or sneezing, hold a pillow firmly against your incision with both hands. This is called "splinting." Doing this helps protect your incision. It also decreases belly discomfort.  If you are being admitted to the hospital overnight, leave your suitcase in the  car. After surgery it may be brought to your room.  If you are being discharged the day of surgery, you will not be allowed to drive home. You will need a responsible adult (18 years or older) to drive you home and stay with you that night.   If you are taking public transportation, you will need to have a responsible adult (18 years or older) with you. Please confirm with your physician that it is acceptable to use public transportation.   Please call the [facility] at [phone] if you have any questions about these instructions.  Surgery Visitation Policy:  Patients undergoing a surgery or procedure may have one family member or support person with them as long as that person is not COVID-19 positive or experiencing its symptoms.  That person may remain in the waiting area during the procedure and may rotate out with other people.  Inpatient Visitation:    Visiting hours are 7 a.m. to 8 p.m. Up to two visitors ages 16+ are allowed at one time in a patient room. The visitors may rotate out with other people during the day. Visitors must check out when they leave, or other visitors will not be allowed. One designated support person may remain overnight. The visitor must pass COVID-19 screenings, use hand sanitizer when entering and exiting the patient's room and wear a mask at all times, including in the patient's room. Patients must also wear a mask when staff or their visitor are in the room.

## 2021-02-05 ENCOUNTER — Ambulatory Visit: Payer: Medicare Other | Attending: Anesthesiology | Admitting: Anesthesiology

## 2021-02-05 ENCOUNTER — Encounter: Payer: Self-pay | Admitting: Anesthesiology

## 2021-02-05 ENCOUNTER — Other Ambulatory Visit: Payer: Self-pay

## 2021-02-05 DIAGNOSIS — Z96651 Presence of right artificial knee joint: Secondary | ICD-10-CM | POA: Diagnosis not present

## 2021-02-05 DIAGNOSIS — G894 Chronic pain syndrome: Secondary | ICD-10-CM | POA: Diagnosis not present

## 2021-02-05 DIAGNOSIS — M47816 Spondylosis without myelopathy or radiculopathy, lumbar region: Secondary | ICD-10-CM | POA: Diagnosis not present

## 2021-02-05 DIAGNOSIS — M25552 Pain in left hip: Secondary | ICD-10-CM

## 2021-02-05 DIAGNOSIS — M545 Low back pain, unspecified: Secondary | ICD-10-CM

## 2021-02-05 DIAGNOSIS — M25551 Pain in right hip: Secondary | ICD-10-CM | POA: Diagnosis not present

## 2021-02-05 DIAGNOSIS — M5136 Other intervertebral disc degeneration, lumbar region: Secondary | ICD-10-CM

## 2021-02-05 DIAGNOSIS — M5431 Sciatica, right side: Secondary | ICD-10-CM | POA: Diagnosis not present

## 2021-02-05 DIAGNOSIS — M25561 Pain in right knee: Secondary | ICD-10-CM | POA: Diagnosis not present

## 2021-02-05 DIAGNOSIS — T8484XS Pain due to internal orthopedic prosthetic devices, implants and grafts, sequela: Secondary | ICD-10-CM

## 2021-02-05 DIAGNOSIS — F1190 Opioid use, unspecified, uncomplicated: Secondary | ICD-10-CM

## 2021-02-05 DIAGNOSIS — M25511 Pain in right shoulder: Secondary | ICD-10-CM | POA: Diagnosis not present

## 2021-02-05 MED ORDER — OXYCODONE HCL 10 MG PO TABS: 10.0000 mg | ORAL_TABLET | ORAL | 0 refills | Status: DC | PRN

## 2021-02-05 NOTE — Addendum Note (Signed)
Addended by: Molli Barrows on: 02/05/2021 03:59 PM   Modules accepted: Orders

## 2021-02-05 NOTE — Progress Notes (Signed)
Virtual Visit via Telephone Note  I connected with Wendy Johnson on 02/05/21 at  9:40 AM EDT by telephone and verified that I am speaking with the correct person using two identifiers.  Location: Patient: Home Provider: Pain control center   I discussed the limitations, risks, security and privacy concerns of performing an evaluation and management service by telephone and the availability of in person appointments. I also discussed with the patient that there may be a patient responsible charge related to this service. The patient expressed understanding and agreed to proceed.   History of Present Illness: I spoke with Wendy Johnson via telephone today she was unable to do the video portion of virtual conference.  She unfortunately has had some increasing right knee pain which is acute on chronic.  She was recently seen by Dr. Rudene Christians and is scheduled for a complete revision of her total knee replacement next Thursday.  Some hardware has come loose.  She is continue to have worsening low back pain and leg pain worsening sciatica on the right side.  No change in strength or bowel or bladder function is noted.  She has been taking her oxycodone more frequently and is taking this approximately 6 times a day.  This is because of the increased pain that she is experiencing in the right knee and low back subsequently she reports.  She takes the medication with no sedation or side effect noted.  She gets 50% improvement with the opioids when she takes them.  As previously documented she has failed more conservative therapy secondary to the severity of her low back pain and diffuse degenerative joint disease and degenerative arthritis.  She is currently out of medication she reports.  She denies any diverting or illicit use.  Review of systems: General: No fevers or chills Pulmonary: No shortness of breath or dyspnea Cardiac: No angina or palpitations or lightheadedness GI: No abdominal pain or constipation Psych:  No depression    Observations/Objective:  Current Outpatient Medications:    albuterol (PROVENTIL) (2.5 MG/3ML) 0.083% nebulizer solution, USE 1 VIAL (3ML) BY NEBULIZATION EVERY 6HOURS AS NEEDED FOR WHEEZING OR SHORTNESS OF BREATH (Patient not taking: No sig reported), Disp: 30 mL, Rfl: 0   amphetamine-dextroamphetamine (ADDERALL) 15 MG tablet, Take 15 mg by mouth daily in the afternoon., Disp: , Rfl:    amphetamine-dextroamphetamine (ADDERALL) 30 MG tablet, Take 30 mg by mouth 2 (two) times daily., Disp: , Rfl:    Aspirin-Caffeine (BC FAST PAIN RELIEF ARTHRITIS PO), Take 1 packet by mouth 2 (two) times daily as needed (pain)., Disp: , Rfl:    busPIRone (BUSPAR) 30 MG tablet, Take 30 mg by mouth 2 (two) times daily. , Disp: , Rfl:    Calcium Carb-Cholecalciferol (CALCIUM 600 + D PO), Take 1 tablet by mouth 2 (two) times daily., Disp: , Rfl:    clonazePAM (KLONOPIN) 1 MG tablet, Take 1-2 mg by mouth 6 (six) times daily., Disp: , Rfl:    cyclobenzaprine (FLEXERIL) 10 MG tablet, Take 10 mg by mouth 3 (three) times daily as needed for muscle spasms., Disp: , Rfl:    dexlansoprazole (DEXILANT) 60 MG capsule, Take 1 capsule (60 mg total) by mouth daily., Disp: 90 capsule, Rfl: 1   gabapentin (NEURONTIN) 300 MG capsule, Take 300 mg by mouth daily. (Patient not taking: Reported on 02/03/2021), Disp: , Rfl:    hydrOXYzine (ATARAX/VISTARIL) 50 MG tablet, Take 50 mg by mouth every 6 (six) hours as needed for itching., Disp: , Rfl:  ibuprofen (ADVIL) 800 MG tablet, Take 1 tablet (800 mg total) by mouth every 8 (eight) hours as needed., Disp: 90 tablet, Rfl: 0   LATUDA 80 MG TABS tablet, Take 80 mg by mouth daily., Disp: , Rfl:    naloxone (NARCAN) nasal spray 4 mg/0.1 mL, As directed for opioid induced respiratory depression, Disp: 1 each, Rfl: 1   Oxycodone HCl 10 MG TABS, Take 1 tablet (10 mg total) by mouth every 4 (four) hours as needed., Disp: 180 tablet, Rfl: 0   [START ON 03/06/2021] Oxycodone HCl 10  MG TABS, Take 1 tablet (10 mg total) by mouth every 4 (four) hours as needed., Disp: 150 tablet, Rfl: 0   Podiatric Products (GOLD BOND FOOT EX), Apply 1 application topically daily., Disp: , Rfl:    PROAIR HFA 108 (90 Base) MCG/ACT inhaler, INHALE 2 PUFFS BY MOUTH 4 TIMES A DAY (Patient taking differently: Inhale 2 puffs into the lungs every 6 (six) hours as needed for wheezing or shortness of breath.), Disp: 8.5 g, Rfl: 0   sertraline (ZOLOFT) 100 MG tablet, Take 200 mg by mouth daily., Disp: , Rfl:    traZODone (DESYREL) 100 MG tablet, Take 400 mg by mouth at bedtime. , Disp: , Rfl:    umeclidinium-vilanterol (ANORO ELLIPTA) 62.5-25 MCG/INH AEPB, Inhale 1 puff into the lungs daily at 6 (six) AM. In place of Spiriva, Disp: 3 each, Rfl: 1   valsartan (DIOVAN) 80 MG tablet, Take 1 tablet (80 mg total) by mouth daily., Disp: 90 tablet, Rfl: 1   Assessment and Plan: 1. Chronic pain syndrome   2. DDD (degenerative disc disease), lumbar   3. Low back pain at multiple sites   4. Sciatica, right side   5. Pain of both hip joints   6. Pain in joint of right shoulder   7. Chronic, continuous use of opioids   8. Facet arthritis of lumbar region   9. Acute pain of right knee   10. Pain due to total right knee replacement, sequela   Her current situation is very difficult and that she has failed on conservative therapy and the only medications that work for our chronic opioid replacement therapy.  She has been on an every 4 hour average prescribed and is taking approximately 150 over the course of the month.  She tailor's these to her pain.  She is currently approximately 5 to 6 days short on her medication and is out at this time.  I am going to provide early refill after review of the [facility] practitioner database information.  This will be dated for today 180 tablets and 150 tablets for her baseline on October 30.  She is scheduled for a total knee revision this coming Thursday at [facility].  In the meantime continue with current medication management with no other changes initiated today.  Should she have any problems with her pain management medication she is instructed to contact us.  She reports understanding of our program and has her Narcan available if necessary.  Continue follow-up with her primary care physician for baseline medical care and return to clinic in 2 months  Follow Up Instructions:    I discussed the assessment and treatment plan with the patient. The patient was provided an opportunity to ask questions and all were answered. The patient agreed with the plan and demonstrated an understanding of the instructions.   The patient was advised to call back or seek an in-person evaluation if the symptoms  worsen or if the condition fails to improve as anticipated.  I provided 30 minutes of non-face-to-face time during this encounter.   Molli Barrows, MD

## 2021-02-09 ENCOUNTER — Other Ambulatory Visit: Admission: RE | Admit: 2021-02-09 | Payer: Medicare Other | Source: Ambulatory Visit

## 2021-02-10 ENCOUNTER — Other Ambulatory Visit: Payer: Self-pay

## 2021-02-10 ENCOUNTER — Other Ambulatory Visit
Admission: RE | Admit: 2021-02-10 | Discharge: 2021-02-10 | Disposition: A | Discharge status: Home / Self Care | Payer: Medicare Other | Source: Ambulatory Visit | Attending: Orthopedic Surgery | Admitting: Orthopedic Surgery

## 2021-02-10 DIAGNOSIS — F429 Obsessive-compulsive disorder, unspecified: Secondary | ICD-10-CM | POA: Diagnosis present

## 2021-02-10 DIAGNOSIS — T8453XA Infection and inflammatory reaction due to internal right knee prosthesis, initial encounter: Secondary | ICD-10-CM | POA: Diagnosis not present

## 2021-02-10 DIAGNOSIS — R531 Weakness: Secondary | ICD-10-CM | POA: Diagnosis not present

## 2021-02-10 DIAGNOSIS — G8929 Other chronic pain: Secondary | ICD-10-CM | POA: Diagnosis not present

## 2021-02-10 DIAGNOSIS — Z01812 Encounter for preprocedural laboratory examination: Secondary | ICD-10-CM | POA: Insufficient documentation

## 2021-02-10 DIAGNOSIS — E119 Type 2 diabetes mellitus without complications: Secondary | ICD-10-CM | POA: Diagnosis not present

## 2021-02-10 DIAGNOSIS — I10 Essential (primary) hypertension: Secondary | ICD-10-CM | POA: Diagnosis not present

## 2021-02-10 DIAGNOSIS — Z6841 Body Mass Index (BMI) 40.0 and over, adult: Secondary | ICD-10-CM | POA: Diagnosis not present

## 2021-02-10 DIAGNOSIS — E876 Hypokalemia: Secondary | ICD-10-CM | POA: Diagnosis not present

## 2021-02-10 DIAGNOSIS — K5909 Other constipation: Secondary | ICD-10-CM | POA: Diagnosis not present

## 2021-02-10 DIAGNOSIS — Z886 Allergy status to analgesic agent status: Secondary | ICD-10-CM | POA: Diagnosis not present

## 2021-02-10 DIAGNOSIS — Z20822 Contact with and (suspected) exposure to covid-19: Secondary | ICD-10-CM | POA: Insufficient documentation

## 2021-02-10 DIAGNOSIS — K219 Gastro-esophageal reflux disease without esophagitis: Secondary | ICD-10-CM | POA: Diagnosis not present

## 2021-02-10 DIAGNOSIS — Z79899 Other long term (current) drug therapy: Secondary | ICD-10-CM | POA: Diagnosis not present

## 2021-02-10 DIAGNOSIS — J449 Chronic obstructive pulmonary disease, unspecified: Secondary | ICD-10-CM | POA: Diagnosis not present

## 2021-02-10 DIAGNOSIS — Z96643 Presence of artificial hip joint, bilateral: Secondary | ICD-10-CM | POA: Diagnosis not present

## 2021-02-10 DIAGNOSIS — K760 Fatty (change of) liver, not elsewhere classified: Secondary | ICD-10-CM | POA: Diagnosis not present

## 2021-02-10 DIAGNOSIS — Z833 Family history of diabetes mellitus: Secondary | ICD-10-CM | POA: Diagnosis not present

## 2021-02-10 DIAGNOSIS — Z800 Family history of malignant neoplasm of digestive organs: Secondary | ICD-10-CM | POA: Diagnosis not present

## 2021-02-10 DIAGNOSIS — T84092A Other mechanical complication of internal right knee prosthesis, initial encounter: Secondary | ICD-10-CM | POA: Diagnosis not present

## 2021-02-10 DIAGNOSIS — T8484XA Pain due to internal orthopedic prosthetic devices, implants and grafts, initial encounter: Secondary | ICD-10-CM | POA: Diagnosis not present

## 2021-02-10 DIAGNOSIS — E7800 Pure hypercholesterolemia, unspecified: Secondary | ICD-10-CM | POA: Diagnosis not present

## 2021-02-10 DIAGNOSIS — Z96651 Presence of right artificial knee joint: Secondary | ICD-10-CM | POA: Diagnosis not present

## 2021-02-10 DIAGNOSIS — H9191 Unspecified hearing loss, right ear: Secondary | ICD-10-CM | POA: Diagnosis not present

## 2021-02-10 DIAGNOSIS — E785 Hyperlipidemia, unspecified: Secondary | ICD-10-CM | POA: Diagnosis not present

## 2021-02-10 DIAGNOSIS — Z8249 Family history of ischemic heart disease and other diseases of the circulatory system: Secondary | ICD-10-CM | POA: Diagnosis not present

## 2021-02-10 DIAGNOSIS — F909 Attention-deficit hyperactivity disorder, unspecified type: Secondary | ICD-10-CM | POA: Diagnosis present

## 2021-02-10 DIAGNOSIS — E669 Obesity, unspecified: Secondary | ICD-10-CM | POA: Diagnosis present

## 2021-02-10 DIAGNOSIS — Z8541 Personal history of malignant neoplasm of cervix uteri: Secondary | ICD-10-CM | POA: Diagnosis not present

## 2021-02-10 LAB — SURGICAL PCR SCREEN
MRSA, PCR: NEGATIVE
Staphylococcus aureus: NEGATIVE

## 2021-02-11 ENCOUNTER — Other Ambulatory Visit: Payer: Self-pay

## 2021-02-11 ENCOUNTER — Inpatient Hospital Stay: Payer: Medicare Other | Admitting: Urgent Care

## 2021-02-11 ENCOUNTER — Encounter
Admission: RE | Disposition: A | Discharge status: Home Health | Payer: Self-pay | Source: Home / Self Care | Attending: Orthopedic Surgery

## 2021-02-11 ENCOUNTER — Inpatient Hospital Stay: Payer: Medicare Other

## 2021-02-11 ENCOUNTER — Encounter: Payer: Self-pay | Admitting: Orthopedic Surgery

## 2021-02-11 ENCOUNTER — Inpatient Hospital Stay
Admission: RE | Admit: 2021-02-11 | Discharge: 2021-02-12 | DRG: 467 | Disposition: A | Discharge status: Home Health | Payer: Medicare Other | Attending: Orthopedic Surgery | Admitting: Orthopedic Surgery

## 2021-02-11 DIAGNOSIS — H9191 Unspecified hearing loss, right ear: Secondary | ICD-10-CM | POA: Diagnosis present

## 2021-02-11 DIAGNOSIS — F429 Obsessive-compulsive disorder, unspecified: Secondary | ICD-10-CM | POA: Diagnosis present

## 2021-02-11 DIAGNOSIS — Z833 Family history of diabetes mellitus: Secondary | ICD-10-CM | POA: Diagnosis not present

## 2021-02-11 DIAGNOSIS — J449 Chronic obstructive pulmonary disease, unspecified: Secondary | ICD-10-CM | POA: Diagnosis present

## 2021-02-11 DIAGNOSIS — Z20822 Contact with and (suspected) exposure to covid-19: Secondary | ICD-10-CM | POA: Diagnosis present

## 2021-02-11 DIAGNOSIS — E876 Hypokalemia: Secondary | ICD-10-CM | POA: Diagnosis present

## 2021-02-11 DIAGNOSIS — K760 Fatty (change of) liver, not elsewhere classified: Secondary | ICD-10-CM | POA: Diagnosis present

## 2021-02-11 DIAGNOSIS — Z96643 Presence of artificial hip joint, bilateral: Secondary | ICD-10-CM | POA: Diagnosis present

## 2021-02-11 DIAGNOSIS — Z79899 Other long term (current) drug therapy: Secondary | ICD-10-CM | POA: Diagnosis not present

## 2021-02-11 DIAGNOSIS — Z96651 Presence of right artificial knee joint: Secondary | ICD-10-CM | POA: Diagnosis not present

## 2021-02-11 DIAGNOSIS — T8484XA Pain due to internal orthopedic prosthetic devices, implants and grafts, initial encounter: Secondary | ICD-10-CM | POA: Diagnosis present

## 2021-02-11 DIAGNOSIS — E669 Obesity, unspecified: Secondary | ICD-10-CM | POA: Diagnosis present

## 2021-02-11 DIAGNOSIS — G8929 Other chronic pain: Secondary | ICD-10-CM | POA: Diagnosis present

## 2021-02-11 DIAGNOSIS — E119 Type 2 diabetes mellitus without complications: Secondary | ICD-10-CM | POA: Diagnosis present

## 2021-02-11 DIAGNOSIS — Z886 Allergy status to analgesic agent status: Secondary | ICD-10-CM | POA: Diagnosis not present

## 2021-02-11 DIAGNOSIS — Z8249 Family history of ischemic heart disease and other diseases of the circulatory system: Secondary | ICD-10-CM

## 2021-02-11 DIAGNOSIS — G8918 Other acute postprocedural pain: Secondary | ICD-10-CM

## 2021-02-11 DIAGNOSIS — E7800 Pure hypercholesterolemia, unspecified: Secondary | ICD-10-CM | POA: Diagnosis present

## 2021-02-11 DIAGNOSIS — Z800 Family history of malignant neoplasm of digestive organs: Secondary | ICD-10-CM | POA: Diagnosis not present

## 2021-02-11 DIAGNOSIS — K219 Gastro-esophageal reflux disease without esophagitis: Secondary | ICD-10-CM | POA: Diagnosis present

## 2021-02-11 DIAGNOSIS — Z6841 Body Mass Index (BMI) 40.0 and over, adult: Secondary | ICD-10-CM | POA: Diagnosis not present

## 2021-02-11 DIAGNOSIS — I10 Essential (primary) hypertension: Secondary | ICD-10-CM | POA: Diagnosis present

## 2021-02-11 DIAGNOSIS — K5909 Other constipation: Secondary | ICD-10-CM | POA: Diagnosis present

## 2021-02-11 DIAGNOSIS — Z8541 Personal history of malignant neoplasm of cervix uteri: Secondary | ICD-10-CM

## 2021-02-11 DIAGNOSIS — F909 Attention-deficit hyperactivity disorder, unspecified type: Secondary | ICD-10-CM | POA: Diagnosis present

## 2021-02-11 HISTORY — PX: TOTAL KNEE REVISION: SHX996

## 2021-02-11 LAB — CREATININE, SERUM
Creatinine, Ser: 0.56 mg/dL (ref 0.44–1.00)
GFR, Estimated: >60 mL/min (ref >60)

## 2021-02-11 LAB — GLUCOSE, CAPILLARY: Glucose-Capillary: 75 mg/dL (ref 70–99)

## 2021-02-11 LAB — CBC
HCT: 36.3 % (ref 36.0–46.0)
Hemoglobin: 12.1 g/dL (ref 12.0–15.0)
MCH: 30.1 pg (ref 26.0–34.0)
MCHC: 33.3 g/dL (ref 30.0–36.0)
MCV: 90.3 fL (ref 80.0–100.0)
Platelets: 188 10*3/uL (ref 150–400)
RBC: 4.02 MIL/uL (ref 3.87–5.11)
RDW: 14.3 % (ref 11.5–15.5)
WBC: 7.3 10*3/uL (ref 4.0–10.5)
nRBC: 0 % (ref 0.0–0.2)

## 2021-02-11 LAB — SARS CORONAVIRUS 2 (TAT 6-24 HRS): SARS Coronavirus 2: NEGATIVE

## 2021-02-11 SURGERY — TOTAL KNEE REVISION
Anesthesia: General | Site: Knee | Laterality: Right

## 2021-02-11 MED ORDER — VANCOMYCIN HCL 1000 MG IV SOLR
INTRAVENOUS | Status: AC
  Filled 2021-02-11: qty 20

## 2021-02-11 MED ORDER — SODIUM CHLORIDE 0.9 % IV SOLN: INTRAVENOUS | Status: DC

## 2021-02-11 MED ORDER — ONDANSETRON HCL 4 MG/2ML IJ SOLN: 4.0000 mg | Freq: Four times a day (QID) | INTRAMUSCULAR | Status: DC | PRN

## 2021-02-11 MED ORDER — LURASIDONE HCL 40 MG PO TABS
80.0000 mg | ORAL_TABLET | Freq: Every day | ORAL | Status: DC
  Administered 2021-02-12: 80 mg via ORAL
  Filled 2021-02-11: qty 1
  Filled 2021-02-11 (×2): qty 2

## 2021-02-11 MED ORDER — FENTANYL CITRATE (PF) 100 MCG/2ML IJ SOLN
INTRAMUSCULAR | Status: AC
  Filled 2021-02-11: qty 2

## 2021-02-11 MED ORDER — CLONAZEPAM 1 MG PO TABS: 1.0000 mg | ORAL_TABLET | Freq: Every day | ORAL | Status: DC

## 2021-02-11 MED ORDER — OXYCODONE HCL 5 MG PO TABS: 5.0000 mg | ORAL_TABLET | ORAL | Status: DC | PRN

## 2021-02-11 MED ORDER — AMPHETAMINE-DEXTROAMPHETAMINE 30 MG PO TABS: 30.0000 mg | ORAL_TABLET | Freq: Two times a day (BID) | ORAL | Status: DC

## 2021-02-11 MED ORDER — CEFAZOLIN SODIUM-DEXTROSE 2-4 GM/100ML-% IV SOLN
INTRAVENOUS | Status: AC
  Filled 2021-02-11: qty 100

## 2021-02-11 MED ORDER — HYDROCERIN EX CREA
TOPICAL_CREAM | Freq: Every day | CUTANEOUS | Status: DC
  Filled 2021-02-11: qty 113

## 2021-02-11 MED ORDER — SUCCINYLCHOLINE CHLORIDE 200 MG/10ML IV SOSY
PREFILLED_SYRINGE | INTRAVENOUS | Status: DC | PRN
  Administered 2021-02-11: 120 mg via INTRAVENOUS

## 2021-02-11 MED ORDER — ONDANSETRON HCL 4 MG PO TABS: 4.0000 mg | ORAL_TABLET | Freq: Four times a day (QID) | ORAL | Status: DC | PRN

## 2021-02-11 MED ORDER — FLEET ENEMA 7-19 GM/118ML RE ENEM: 1.0000 | ENEMA | Freq: Once | RECTAL | Status: DC | PRN

## 2021-02-11 MED ORDER — PROMETHAZINE HCL 25 MG/ML IJ SOLN: 6.2500 mg | INTRAMUSCULAR | Status: DC | PRN

## 2021-02-11 MED ORDER — MORPHINE SULFATE (PF) 10 MG/ML IV SOLN
INTRAVENOUS | Status: AC
  Filled 2021-02-11: qty 1

## 2021-02-11 MED ORDER — AMPHETAMINE-DEXTROAMPHETAMINE 10 MG PO TABS
30.0000 mg | ORAL_TABLET | Freq: Two times a day (BID) | ORAL | Status: DC
  Administered 2021-02-11 – 2021-02-12 (×2): 30 mg via ORAL
  Filled 2021-02-11 (×2): qty 3

## 2021-02-11 MED ORDER — PROPOFOL 10 MG/ML IV BOLUS
INTRAVENOUS | Status: DC | PRN
  Administered 2021-02-11: 150 mg via INTRAVENOUS

## 2021-02-11 MED ORDER — TRAMADOL HCL 50 MG PO TABS
50.0000 mg | ORAL_TABLET | Freq: Four times a day (QID) | ORAL | Status: DC
Start: 2021-02-11 — End: 2021-02-12
  Administered 2021-02-12: 50 mg via ORAL
  Filled 2021-02-11 (×2): qty 1

## 2021-02-11 MED ORDER — OXYCODONE HCL ER 15 MG PO T12A
15.0000 mg | EXTENDED_RELEASE_TABLET | Freq: Two times a day (BID) | ORAL | Status: DC
Start: 2021-02-11 — End: 2021-02-12
  Administered 2021-02-11: 15 mg via ORAL
  Filled 2021-02-11: qty 1

## 2021-02-11 MED ORDER — ORAL CARE MOUTH RINSE: 15.0000 mL | Freq: Once | OROMUCOSAL | Status: AC

## 2021-02-11 MED ORDER — CHLORHEXIDINE GLUCONATE 0.12 % MT SOLN: 15.0000 mL | Freq: Once | OROMUCOSAL | Status: AC

## 2021-02-11 MED ORDER — SUGAMMADEX SODIUM 500 MG/5ML IV SOLN
INTRAVENOUS | Status: DC | PRN
  Administered 2021-02-11: 439.6 mg via INTRAVENOUS

## 2021-02-11 MED ORDER — 0.9 % SODIUM CHLORIDE (POUR BTL) OPTIME
TOPICAL | Status: DC | PRN
  Administered 2021-02-11: 50 mL

## 2021-02-11 MED ORDER — ROCURONIUM BROMIDE 100 MG/10ML IV SOLN
INTRAVENOUS | Status: DC | PRN
  Administered 2021-02-11: 50 mg via INTRAVENOUS
  Administered 2021-02-11: 30 mg via INTRAVENOUS
  Administered 2021-02-11: 10 mg via INTRAVENOUS
  Administered 2021-02-11: 50 mg via INTRAVENOUS
  Administered 2021-02-11: 40 mg via INTRAVENOUS

## 2021-02-11 MED ORDER — OXYCODONE HCL 5 MG PO TABS
10.0000 mg | ORAL_TABLET | ORAL | Status: DC | PRN
  Administered 2021-02-11 – 2021-02-12 (×5): 15 mg via ORAL
  Filled 2021-02-11 (×5): qty 3

## 2021-02-11 MED ORDER — ACETAMINOPHEN 325 MG PO TABS: 325.0000 mg | ORAL_TABLET | Freq: Four times a day (QID) | ORAL | Status: DC | PRN

## 2021-02-11 MED ORDER — BUPIVACAINE-EPINEPHRINE (PF) 0.25% -1:200000 IJ SOLN
INTRAMUSCULAR | Status: AC
  Filled 2021-02-11: qty 30

## 2021-02-11 MED ORDER — MIDAZOLAM HCL 2 MG/2ML IJ SOLN
INTRAMUSCULAR | Status: AC
  Filled 2021-02-11: qty 2

## 2021-02-11 MED ORDER — PROPOFOL 500 MG/50ML IV EMUL
INTRAVENOUS | Status: DC | PRN
  Administered 2021-02-11: 20 ug/kg/min via INTRAVENOUS

## 2021-02-11 MED ORDER — LIDOCAINE HCL (CARDIAC) PF 100 MG/5ML IV SOSY
PREFILLED_SYRINGE | INTRAVENOUS | Status: DC | PRN
  Administered 2021-02-11: 100 mg via INTRAVENOUS

## 2021-02-11 MED ORDER — MENTHOL 3 MG MT LOZG
1.0000 | LOZENGE | OROMUCOSAL | Status: DC | PRN
  Filled 2021-02-11: qty 9

## 2021-02-11 MED ORDER — DEXAMETHASONE SODIUM PHOSPHATE 10 MG/ML IJ SOLN
INTRAMUSCULAR | Status: DC | PRN
  Administered 2021-02-11: 10 mg via INTRAVENOUS

## 2021-02-11 MED ORDER — TRAZODONE HCL 100 MG PO TABS
400.0000 mg | ORAL_TABLET | Freq: Every day | ORAL | Status: DC
  Administered 2021-02-11: 400 mg via ORAL
  Filled 2021-02-11: qty 4

## 2021-02-11 MED ORDER — SODIUM CHLORIDE 0.9 % IV SOLN
INTRAVENOUS | Status: DC | PRN
  Administered 2021-02-11: 60 mL

## 2021-02-11 MED ORDER — OXYCODONE HCL 5 MG PO TABS
ORAL_TABLET | ORAL | Status: AC
  Administered 2021-02-11: 5 mg via ORAL
  Filled 2021-02-11: qty 1

## 2021-02-11 MED ORDER — VANCOMYCIN HCL 1000 MG IV SOLR
INTRAVENOUS | Status: DC | PRN
  Administered 2021-02-11: 1000 mg via TOPICAL

## 2021-02-11 MED ORDER — BUSPIRONE HCL 10 MG PO TABS
30.0000 mg | ORAL_TABLET | Freq: Two times a day (BID) | ORAL | Status: DC
  Administered 2021-02-11 – 2021-02-12 (×2): 30 mg via ORAL
  Filled 2021-02-11 (×2): qty 3

## 2021-02-11 MED ORDER — NEOMYCIN-POLYMYXIN B GU 40-200000 IR SOLN
Status: AC
  Filled 2021-02-11: qty 20

## 2021-02-11 MED ORDER — FENTANYL CITRATE (PF) 250 MCG/5ML IJ SOLN
INTRAMUSCULAR | Status: AC
  Filled 2021-02-11: qty 5

## 2021-02-11 MED ORDER — ONDANSETRON HCL 4 MG/2ML IJ SOLN
INTRAMUSCULAR | Status: DC | PRN
  Administered 2021-02-11: 4 mg via INTRAVENOUS

## 2021-02-11 MED ORDER — SERTRALINE HCL 50 MG PO TABS
200.0000 mg | ORAL_TABLET | Freq: Every day | ORAL | Status: DC
  Administered 2021-02-12: 200 mg via ORAL
  Filled 2021-02-11: qty 4

## 2021-02-11 MED ORDER — ACETAMINOPHEN 10 MG/ML IV SOLN
INTRAVENOUS | Status: DC | PRN
  Administered 2021-02-11: 1000 mg via INTRAVENOUS

## 2021-02-11 MED ORDER — ALBUTEROL SULFATE (2.5 MG/3ML) 0.083% IN NEBU: 2.5000 mg | INHALATION_SOLUTION | Freq: Four times a day (QID) | RESPIRATORY_TRACT | Status: DC | PRN

## 2021-02-11 MED ORDER — CEFAZOLIN SODIUM-DEXTROSE 2-4 GM/100ML-% IV SOLN
2.0000 g | Freq: Four times a day (QID) | INTRAVENOUS | Status: DC
  Administered 2021-02-11 – 2021-02-12 (×4): 2 g via INTRAVENOUS
  Filled 2021-02-11 (×4): qty 100

## 2021-02-11 MED ORDER — ALBUTEROL SULFATE HFA 108 (90 BASE) MCG/ACT IN AERS
2.0000 | INHALATION_SPRAY | Freq: Four times a day (QID) | RESPIRATORY_TRACT | Status: DC | PRN
  Filled 2021-02-11: qty 6.7

## 2021-02-11 MED ORDER — HYDROMORPHONE HCL 1 MG/ML IJ SOLN
INTRAMUSCULAR | Status: DC | PRN
  Administered 2021-02-11: 1 mg via INTRAVENOUS

## 2021-02-11 MED ORDER — BUPIVACAINE LIPOSOME 1.3 % IJ SUSP
INTRAMUSCULAR | Status: AC
  Filled 2021-02-11: qty 20

## 2021-02-11 MED ORDER — ENOXAPARIN SODIUM 30 MG/0.3ML IJ SOSY
30.0000 mg | PREFILLED_SYRINGE | Freq: Two times a day (BID) | INTRAMUSCULAR | Status: DC
  Administered 2021-02-12: 30 mg via SUBCUTANEOUS
  Filled 2021-02-11: qty 0.3

## 2021-02-11 MED ORDER — DOCUSATE SODIUM 100 MG PO CAPS
100.0000 mg | ORAL_CAPSULE | Freq: Two times a day (BID) | ORAL | Status: DC
  Administered 2021-02-11 – 2021-02-12 (×2): 100 mg via ORAL
  Filled 2021-02-11 (×2): qty 1

## 2021-02-11 MED ORDER — DIPHENHYDRAMINE HCL 12.5 MG/5ML PO ELIX: 12.5000 mg | ORAL_SOLUTION | ORAL | Status: DC | PRN

## 2021-02-11 MED ORDER — SODIUM CHLORIDE 0.9 % IR SOLN
Status: DC | PRN
  Administered 2021-02-11: 3000 mL

## 2021-02-11 MED ORDER — IRBESARTAN 150 MG PO TABS
75.0000 mg | ORAL_TABLET | Freq: Every day | ORAL | Status: DC
Start: 2021-02-11 — End: 2021-02-12
  Administered 2021-02-12: 75 mg via ORAL
  Filled 2021-02-11: qty 1

## 2021-02-11 MED ORDER — PROPOFOL 1000 MG/100ML IV EMUL
INTRAVENOUS | Status: AC
  Filled 2021-02-11: qty 100

## 2021-02-11 MED ORDER — CYCLOBENZAPRINE HCL 10 MG PO TABS
10.0000 mg | ORAL_TABLET | Freq: Three times a day (TID) | ORAL | Status: DC | PRN
  Administered 2021-02-11 – 2021-02-12 (×2): 10 mg via ORAL
  Filled 2021-02-11 (×2): qty 1

## 2021-02-11 MED ORDER — NEOMYCIN-POLYMYXIN B GU 40-200000 IR SOLN
Status: DC | PRN
  Administered 2021-02-11: 14 mL

## 2021-02-11 MED ORDER — HYDROMORPHONE HCL 1 MG/ML IJ SOLN
INTRAMUSCULAR | Status: AC
  Filled 2021-02-11: qty 1

## 2021-02-11 MED ORDER — BISACODYL 10 MG RE SUPP: 10.0000 mg | Freq: Every day | RECTAL | Status: DC | PRN

## 2021-02-11 MED ORDER — FENTANYL CITRATE (PF) 100 MCG/2ML IJ SOLN
INTRAMUSCULAR | Status: DC | PRN
  Administered 2021-02-11: 50 ug via INTRAVENOUS
  Administered 2021-02-11: 100 ug via INTRAVENOUS
  Administered 2021-02-11 (×4): 50 ug via INTRAVENOUS

## 2021-02-11 MED ORDER — ZOLPIDEM TARTRATE 5 MG PO TABS: 5.0000 mg | ORAL_TABLET | Freq: Every evening | ORAL | Status: DC | PRN

## 2021-02-11 MED ORDER — UMECLIDINIUM-VILANTEROL 62.5-25 MCG/INH IN AEPB
1.0000 | INHALATION_SPRAY | Freq: Every day | RESPIRATORY_TRACT | Status: DC
  Filled 2021-02-11 (×2): qty 14

## 2021-02-11 MED ORDER — PANTOPRAZOLE SODIUM 40 MG PO TBEC
40.0000 mg | DELAYED_RELEASE_TABLET | Freq: Every day | ORAL | Status: DC
  Administered 2021-02-12: 40 mg via ORAL
  Filled 2021-02-11: qty 1

## 2021-02-11 MED ORDER — PHENYLEPHRINE HCL (PRESSORS) 10 MG/ML IV SOLN
INTRAVENOUS | Status: AC
  Filled 2021-02-11: qty 2

## 2021-02-11 MED ORDER — PHENOL 1.4 % MT LIQD
1.0000 | OROMUCOSAL | Status: DC | PRN
  Filled 2021-02-11: qty 177

## 2021-02-11 MED ORDER — MORPHINE SULFATE (PF) 10 MG/ML IV SOLN
INTRAVENOUS | Status: DC | PRN
  Administered 2021-02-11: 10 mg via INTRAMUSCULAR

## 2021-02-11 MED ORDER — POLYETHYLENE GLYCOL 3350 17 G PO PACK: 17.0000 g | PACK | Freq: Every day | ORAL | Status: DC | PRN

## 2021-02-11 MED ORDER — AMPHETAMINE-DEXTROAMPHETAMINE 5 MG PO TABS: 15.0000 mg | ORAL_TABLET | Freq: Every day | ORAL | Status: DC

## 2021-02-11 MED ORDER — MIDAZOLAM HCL 2 MG/2ML IJ SOLN
INTRAMUSCULAR | Status: DC | PRN
  Administered 2021-02-11: 2 mg via INTRAVENOUS

## 2021-02-11 MED ORDER — CLONAZEPAM 1 MG PO TABS
1.0000 mg | ORAL_TABLET | Freq: Two times a day (BID) | ORAL | Status: DC
  Administered 2021-02-11 – 2021-02-12 (×2): 1 mg via ORAL
  Filled 2021-02-11 (×2): qty 1

## 2021-02-11 MED ORDER — CHLORHEXIDINE GLUCONATE 0.12 % MT SOLN
OROMUCOSAL | Status: AC
  Administered 2021-02-11: 15 mL via OROMUCOSAL
  Filled 2021-02-11: qty 15

## 2021-02-11 MED ORDER — FENTANYL CITRATE (PF) 100 MCG/2ML IJ SOLN
25.0000 ug | INTRAMUSCULAR | Status: DC | PRN
  Administered 2021-02-11: 25 ug via INTRAVENOUS

## 2021-02-11 MED ORDER — SODIUM CHLORIDE FLUSH 0.9 % IV SOLN
INTRAVENOUS | Status: AC
  Filled 2021-02-11: qty 40

## 2021-02-11 MED ORDER — SODIUM CHLORIDE 0.9 % IV SOLN
INTRAVENOUS | Status: DC | PRN
  Administered 2021-02-11: 25 ug/min via INTRAVENOUS

## 2021-02-11 MED ORDER — HYDROMORPHONE HCL 1 MG/ML IJ SOLN
0.5000 mg | INTRAMUSCULAR | Status: DC | PRN
  Administered 2021-02-11: 1 mg via INTRAVENOUS
  Filled 2021-02-11: qty 1

## 2021-02-11 MED ORDER — ACETAMINOPHEN 500 MG PO TABS
1000.0000 mg | ORAL_TABLET | Freq: Four times a day (QID) | ORAL | Status: DC
  Administered 2021-02-11 – 2021-02-12 (×2): 1000 mg via ORAL
  Filled 2021-02-11 (×3): qty 2

## 2021-02-11 MED ORDER — CEFAZOLIN SODIUM-DEXTROSE 2-4 GM/100ML-% IV SOLN
2.0000 g | INTRAVENOUS | Status: AC
  Administered 2021-02-11: 2 g via INTRAVENOUS

## 2021-02-11 MED ORDER — AMPHETAMINE-DEXTROAMPHETAMINE 10 MG PO TABS
15.0000 mg | ORAL_TABLET | Freq: Every day | ORAL | Status: DC
  Administered 2021-02-12: 15 mg via ORAL
  Filled 2021-02-11: qty 2

## 2021-02-11 MED ORDER — BUPIVACAINE-EPINEPHRINE 0.25% -1:200000 IJ SOLN
INTRAMUSCULAR | Status: DC | PRN
  Administered 2021-02-11: 30 mL

## 2021-02-11 MED ORDER — LACTATED RINGERS IV SOLN: INTRAVENOUS | Status: DC

## 2021-02-11 SURGICAL SUPPLY — 76 items
AUG FEM DISTAL SZ3 H16 (Miscellaneous) ×4 IMPLANT
AUGMENT FEM DISTAL SZ3 H16 (Miscellaneous) ×2 IMPLANT
BLADE SAW 90X13X1.19 OSCILLAT (BLADE) ×2 IMPLANT
BLADE SAW 90X25X1.19 OSCILLAT (BLADE) ×2 IMPLANT
BLADE SAW SAG 25.4X90 (BLADE) ×2 IMPLANT
BNDG ELASTIC 6X5.8 VLCR STR LF (GAUZE/BANDAGES/DRESSINGS) ×2 IMPLANT
BUR RND POLISHING (BURR) ×2 IMPLANT
CANISTER WOUND CARE 500ML ATS (WOUND CARE) ×2 IMPLANT
CEMENT HV SMART SET (Cement) ×4 IMPLANT
CHLORAPREP W/TINT 26 (MISCELLANEOUS) ×4 IMPLANT
CONE FEM LRG 30 (Knees) ×2 IMPLANT
CONE TIBIAL CENTRED XS H25 (Miscellaneous) ×2 IMPLANT
COOLER POLAR GLACIER W/PUMP (MISCELLANEOUS) ×2 IMPLANT
COVER BACK TABLE REUSABLE LG (DRAPES) ×2 IMPLANT
CUFF TOURN SGL QUICK 24 (TOURNIQUET CUFF)
CUFF TOURN SGL QUICK 34 (TOURNIQUET CUFF)
CUFF TRNQT CYL 24X4X16.5-23 (TOURNIQUET CUFF) IMPLANT
CUFF TRNQT CYL 34X4.125X (TOURNIQUET CUFF) IMPLANT
DRAPE 3/4 80X56 (DRAPES) ×8 IMPLANT
DRAPE INCISE IOBAN 66X45 STRL (DRAPES) ×2 IMPLANT
ELECT CAUTERY BLADE 6.4 (BLADE) ×2 IMPLANT
ELECT REM PT RETURN 9FT ADLT (ELECTROSURGICAL) ×2
ELECTRODE REM PT RTRN 9FT ADLT (ELECTROSURGICAL) ×1 IMPLANT
FEMORAL REV COMP SZ3 R PS (Femur) ×2 IMPLANT
GAUZE 4X4 16PLY ~~LOC~~+RFID DBL (SPONGE) ×2 IMPLANT
GAUZE SPONGE 4X4 12PLY STRL (GAUZE/BANDAGES/DRESSINGS) ×2 IMPLANT
GAUZE XEROFORM 1X8 LF (GAUZE/BANDAGES/DRESSINGS) ×2 IMPLANT
GLOVE SURG ORTHO LTX SZ8 (GLOVE) ×2 IMPLANT
GLOVE SURG SYN 9.0  PF PI (GLOVE) ×1
GLOVE SURG SYN 9.0 PF PI (GLOVE) ×1 IMPLANT
GLOVE SURG UNDER LTX SZ8 (GLOVE) ×2 IMPLANT
GLOVE SURG UNDER POLY LF SZ9 (GLOVE) ×2 IMPLANT
GOWN SRG 2XL LVL 4 RGLN SLV (GOWNS) ×1 IMPLANT
GOWN STRL NON-REIN 2XL LVL4 (GOWNS) ×1
GOWN STRL REUS W/ TWL LRG LVL3 (GOWN DISPOSABLE) ×1 IMPLANT
GOWN STRL REUS W/ TWL XL LVL3 (GOWN DISPOSABLE) ×1 IMPLANT
GOWN STRL REUS W/TWL LRG LVL3 (GOWN DISPOSABLE) ×1
GOWN STRL REUS W/TWL XL LVL3 (GOWN DISPOSABLE) ×1
HOLDER FOLEY CATH W/STRAP (MISCELLANEOUS) ×2 IMPLANT
INSERT KNEE TIBIAL SZ2 17 (Insert) ×2 IMPLANT
IV NS IRRIG 3000ML ARTHROMATIC (IV SOLUTION) ×2 IMPLANT
KIT PREVENA INCISION MGT20CM45 (CANNISTER) ×2 IMPLANT
KIT STIMULAN RAPID CURE 5CC (Orthopedic Implant) ×2 IMPLANT
KIT TURNOVER KIT A (KITS) ×2 IMPLANT
KNIFE SCULPS 14X20 (INSTRUMENTS) ×2 IMPLANT
MANIFOLD NEPTUNE II (INSTRUMENTS) ×4 IMPLANT
NEEDLE SPNL 18GX3.5 QUINCKE PK (NEEDLE) ×2 IMPLANT
NEEDLE SPNL 20GX3.5 QUINCKE YW (NEEDLE) ×2 IMPLANT
NS IRRIG 1000ML POUR BTL (IV SOLUTION) ×2 IMPLANT
PACK TOTAL KNEE (MISCELLANEOUS) ×2 IMPLANT
PAD WRAPON POLAR KNEE (MISCELLANEOUS) ×1 IMPLANT
PULSAVAC PLUS IRRIG FAN TIP (DISPOSABLE) ×2
SCALPEL PROTECTED #10 DISP (BLADE) ×6 IMPLANT
SPONGE T-LAP 18X18 ~~LOC~~+RFID (SPONGE) ×6 IMPLANT
STAPLER SKIN PROX 35W (STAPLE) ×2 IMPLANT
STEM EXT FLUTED KNEE 15X105MM (Knees) ×2 IMPLANT
STEM EXTEN REV 11MM (Stem) ×2 IMPLANT
SUCTION FRAZIER HANDLE 10FR (MISCELLANEOUS) ×1
SUCTION TUBE FRAZIER 10FR DISP (MISCELLANEOUS) ×1 IMPLANT
SUT BONE WAX W31G (SUTURE) ×2 IMPLANT
SUT DVC 2 QUILL PDO  T11 36X36 (SUTURE) ×1
SUT DVC 2 QUILL PDO T11 36X36 (SUTURE) ×1 IMPLANT
SUT ETHIBOND NAB CT1 #1 30IN (SUTURE) ×4 IMPLANT
SUT TICRON 2-0 30IN 311381 (SUTURE) IMPLANT
SUT V-LOC 90 ABS DVC 3-0 CL (SUTURE) ×2 IMPLANT
SWAB CULTURE AMIES ANAERIB BLU (MISCELLANEOUS) ×2 IMPLANT
SYR 20ML LL LF (SYRINGE) ×2 IMPLANT
SYR 50ML LL SCALE MARK (SYRINGE) ×4 IMPLANT
TIBIAL REV AUGMENTATION SZ2 5 (Orthopedic Implant) ×4 IMPLANT
TIP FAN IRRIG PULSAVAC PLUS (DISPOSABLE) ×1 IMPLANT
TOWEL OR 17X26 4PK STRL BLUE (TOWEL DISPOSABLE) ×2 IMPLANT
TOWER CARTRIDGE SMART MIX (DISPOSABLE) ×2 IMPLANT
TRAY FOLEY MTR SLVR 16FR STAT (SET/KITS/TRAYS/PACK) ×2 IMPLANT
TRAY TIBIAL REV SZ2 RT (Miscellaneous) ×2 IMPLANT
WATER STERILE IRR 500ML POUR (IV SOLUTION) ×2 IMPLANT
WRAPON POLAR PAD KNEE (MISCELLANEOUS) ×2

## 2021-02-11 NOTE — Op Note (Signed)
02/11/2021  11:11 AM  PATIENT:  Wendy Johnson  55 y.o. female  PRE-OPERATIVE DIAGNOSIS:  Status post total right knee replacement  Z96.651 Chronic poain of right knee M25.561, G89.29 Painful orthopaedic hardware T84.84XA  POST-OPERATIVE DIAGNOSIS:  Status post total right knee replacement  Z96.651  PROCEDURE:  Procedure(s): TOTAL KNEE REVISION (Right)  SURGEON: Laurene Footman, MD  ASSISTANTS: Rachelle Hora, PA-C  ANESTHESIA:   general  EBL:  Total I/O In: 2200 [I.V.:2000; IV Piggyback:200] Out: 225 [Urine:125; Blood:100]  BLOOD ADMINISTERED:none  DRAINS:  Incisional wound  vac  LOCAL MEDICATIONS USED:  MARCAINE    and OTHER Exparel and morphine  SPECIMEN: Joint fluid culture right knee  DISPOSITION OF SPECIMEN:   Microbiology  COUNTS:  YES  TOURNIQUET:   Total Tourniquet Time Documented: Thigh (Right) - 105 minutes Total: Thigh (Right) - 105 minutes   IMPLANTS: Medacta revision total knee system GM K size three 37mm distal augments with L 30 mm femoral sleeve 15 x 105 stem on the femur 3 right PS femur cemented tibial side was a 2 right with 5 mm medial and lateral augments 11 x 105 stem cemented with an extra small 25 mm tibial cone 17 mm Wolfe implant all components cemented patella was left in place from prior surgery  DICTATION: .Dragon Dictation patient was brought the operating room and after adequate anesthesia was obtained the right leg was prepped and draped you sterile fashion with a tourniquet by the upper thigh.  After patient identification and timeout procedure completed prior incision was utilized for anterior exposure followed by medial parapatellar arthrotomy.  Fluid was clear but culture obtained this for potential infection.  The tibial polyethylene was removed with use of a saw to remove the medial third this allowed it to be spun around and come out without difficulty.  Next the femur and tibia were exposed with thin flexible osteotomes used to try to  break the cement bone interface and implant cement interfaces.  After this was completed the femoral component was removed without much difficulty and very little bone loss.  The distal sleeve was still in place.  Thin flexible osteotomes were used to remove this with very little bone loss.  For the tibial side thin flexible osteotomes used again and this came out as 1 component again there appeared to be loosening of the tibial baseplate as preop bone scan had shown.  With all components removed the knee was thoroughly irrigated the tibia was prepped with intramedullary rod placements both hand reaming followed by a proximal freshen up cut the prep proximal tibia sized to a size 2 with 0 offset 5 mm augments were chosen.  The keel punch placed.  The cone was placed with proximal reaming and the extra small cone 25 mm size fit well.  Going to the femur hand reaming was utilized followed by distal cut off the intramedullary guide following this the box cut was completed and trials were performed using a 3 femur with 60 mm augments with trial was placed on both sides a 14 or 17 mm insert was felt to be appropriate and so that was left for the end of the case after final implants were placed.  The cone in the femur was set as well with and broaching.  All components were set tourniquet been raised previously the bony surfaces thoroughly irrigated and dried the tibial component cemented into place first followed by the femoral component and then a polyethylene trial with the knee  held in extension as the cement set.  After this was completed excess cement was removed and there was some mid flexion instability so a 17 mm insert was chosen with good stability throughout the range of motion and normal tracking the patella.  The knee was thoroughly irrigated with Betadine wash after the final components were chosen 17 mm insert placed and setscrew tightened with a torque screwdriver.  The gutters were filled with stimulant  beads with vancomycin and the arthrotomy repair using #1 Ethibond along the medial retinaculum to reinforce the repair followed by a heavy Quill followed by 3 OV lock subcutaneous and skin staples.  Incisional wound VAC applied along with Polar Care.  PLAN OF CARE: Admit to inpatient   PATIENT DISPOSITION:  PACU - hemodynamically stable.

## 2021-02-11 NOTE — H&P (Signed)
Chief Complaint  Patient presents with   Pre-op Exam  Revision, Right TKA 02/11/21 by Dr. Rudene Christians    History of the Present Illness: Wendy Johnson is a 55 y.o. female here today for history and physical for right total knee revision with Dr. Hessie Knows on 02/11/2021. Patient has underwent multiple right knee surgeries. Recent x-rays showed possible loosening, bone scan from [facility] showed uptake of the lateral tibial component, medial aspect of the femoral component and adjacent to the medial tibial component with possible loosening. She is continuing to have pain and swelling that will awaken her at night. Pain is interfering with her ability to be mobile. She has had normal blood work indicating no signs of infection. Patient is currently under pain contract with the pain clinic and she gets spinal injections every other month and takes oxycodone 10 mg 5 times daily.  I have reviewed past medical, surgical, social and family history, and allergies as documented in the EMR.  Past Medical History: Past Medical History:  Diagnosis Date   Arthritis   Bipolar affective disorder, currently depressed, mild (CMS-HCC)   Chickenpox   COPD (chronic obstructive pulmonary disease) (CMS-HCC)   Depression   Diabetes mellitus, type 2 (CMS-HCC)   GERD (gastroesophageal reflux disease)   Hemorrhoids   History of alcoholism (CMS-HCC)   Hives   Hyperlipidemia   Hypertension   Menstrual abnormality   Shingles   Sleep apnea   Past Surgical History: Past Surgical History:  Procedure Laterality Date   Cervical cancer cells removed 1996   COLONOSCOPY   EGD 04/06/2010  No need to repeat per Dr. Gustavo Lah.   Fatty tumor removed 08/2008  from abdomen   HERNIA REPAIR   HYSTERECTOMY   JOINT REPLACEMENT Right 02/10/2009  total knee   KNEE ARTHROSCOPY Right 10/14/2008  partial medial and lateral meniscectomies, chondroplasty right medial femoral condyle, lateral release   LAPAROSCOPY DIAGNOSTIC 09/2014   due to endometriosis   R TKA revision polyethylene insert right total knee 12/20/2011 Right 12/20/2011   release dorsal compartment dequervain left, carpal tunnel release left Left 06/08/2017  Dr.Czar Ysaguirre   Revision of all femoral and tibial components, right total knee Right 05/06/14   SIGMOIDOSCOPY FLEXIBLE W/REMOVAL LESIONS BY SNARE N/A 11/28/2016  Procedure: flex sig; Surgeon: Kathreen Cosier, MD; Location: [facility]; Service: Gastroenterology; Laterality: N/A;   Spurs removed 2008, 2009  Bilateral feet   TONSILLECTOMY 1992   total hip arthroplasty anterior approach Left 8.9.16  Dr.Kirkland Figg   Total hip arthroplsty anterior approach Right 05/12/2015  Dr.Xzaiver Vayda   TOTAL KNEE REVISION (Right) Right 10/11/2016  Hessie Knows, MD   Total knee revision/revise polyiethylene Right 09/22/2015  Dr.Yamil Oelke   TUBAL LIGATION   Past Family History: Family History  Problem Relation Age of Onset   Diabetes Mother   High blood pressure (Hypertension) Mother   High blood pressure (Hypertension) Father   Diabetes Sister   Colon cancer Paternal Grandmother   Cancer Paternal Uncle  "hole in throat"   Medications: Current Outpatient Medications Ordered in Epic  Medication Sig Dispense Refill   albuterol (PROVENTIL) 2.5 mg /3 mL (0.083 %) nebulizer solution Inhale into the lungs   albuterol 90 mcg/actuation inhaler INHALE 2 PUFFS BY MOUTH 4 TIMES A DAY   AMPHETAMINE-DEXTROAMPHETAMINE 30 MG tablet Take 30 mg by mouth 2 (two) times daily.    ANORO ELLIPTA 62.5-25 mcg/actuation inhaler Inhale into the lungs once daily (Patient not taking: Reported on 02/03/2021)   busPIRone (BUSPAR) 30 MG  tablet Take 1 tablet by mouth 2 (two) times daily.    clonazePAM (KLONOPIN) 1 MG tablet Take 1 mg by mouth 6 (six) times daily.    cyclobenzaprine (FLEXERIL) 10 MG tablet TAKE 1 TABLET BY MOUTH 3 TIMES DAILY 30 tablet 0   dexlansoprazole (DEXILANT) 60 mg DR capsule   gabapentin (NEURONTIN) 300 MG capsule  TAKE 1 CAPSULE BY MOUTH NIGHTLY FOR 4 DAYS THEN PROGRESS TO 1 CAPSULE EACH MORNING AND 1 CAPSULE AT NIGHT 30 capsule 0   hydrALAZINE (APRESOLINE) 10 MG tablet   hydrOXYzine (ATARAX) 50 MG tablet Take by mouth   indomethacin (INDOCIN) 25 MG capsule Take 1 capsule (25 mg total) by mouth 3 (three) times daily with meals for 90 days (Patient not taking: Reported on 02/03/2021) 90 capsule 1   lurasidone (LATUDA) 60 mg tablet Take 1 tablet by mouth at bedtime   lurasidone (LATUDA) 80 mg Tab tablet Take by mouth   NARCAN 4 mg/actuation nasal spray Place into one nostril as needed   omega-3 acid ethyl esters (LOVAZA) 1 gram capsule   oxyCODONE (DAZIDOX) 10 mg immediate release tablet   sertraline (ZOLOFT) 100 MG tablet 100 mg 2 (two) times daily.   traZODone (DESYREL) 100 MG tablet   valsartan (DIOVAN) 80 MG tablet Take 1 tablet by mouth once daily   No current Epic-ordered facility-administered medications on file.   Allergies: Allergies  Allergen Reactions   Acetaminophen Other (See Comments)  Stomach hurt   Codeine Nausea   Sumatriptan Other (See Comments)  Chest pain    Body mass index is 41.71 kg/m.  Review of Systems: A comprehensive 14 point ROS was performed, reviewed, and the pertinent orthopaedic findings are documented in the HPI.  Vitals:  02/03/21 1552  BP: (!) 142/98    General Physical Examination:  General:  Well developed, well nourished, no apparent distress, normal affect, minimal antalgic gait with no assistive devices.  HEENT: Head normocephalic, atraumatic, PERRL.   Abdomen: Soft, non tender, non distended, Bowel sounds present.  Heart: Examination of the heart reveals regular, rate, and rhythm. There is no murmur noted on ascultation. There is a normal apical pulse.  Lungs: Lungs are clear to auscultation. There is no wheeze, rhonchi, or crackles. There is normal expansion of bilateral chest walls. al mood and affect, oriented to person, place, and  time.  Musculoskeletal Examination:  Right lower extremity shows patient has good hip range of motion with no discomfort. She is able to straight leg raise. She has 5 to 95 degrees range of motion. Knee is stable to valgus varus stress testing. Incision site completely healed with no swelling warmth erythema or drainage. No Baker's cyst. No effusion. She has no swelling or tenderness throughout the calf.  Radiographs:  EXAM:  NM BONE SCAN AND SPECT IMAGING   TECHNIQUE:  After intravenous injection of radiopharmaceutical, delayed planar  images were obtained in multiple projections. Additionally, delayed  triplanar SPECT images were obtained through the area of interest.   RADIOPHARMACEUTICALS:  23.9 mCi Tc-61m MDP   COMPARISON:  None. Findings are correlated with right knee  radiographs of 12/04/2020   FINDINGS:  Radionuclide angiography: Normal symmetric perfusion of the knees  bilaterally.   Blood pool: Defect related to right total knee arthroplasty  identified. No asymmetric hyperemia.   Delayed phase: There is asymmetric uptake of radiotracer subjacent  to the lateral tibial component and, to a lesser extent, focal  deposition along the medial aspect of the femoral component and  subjacent to the medial tibial component. Deposition of radiotracer  within the left knee is likely degenerative in nature.   Correlated with CT imaging demonstrates surgical changes of hinged  long-stem right total knee arthroplasty. No unexpected fracture or  dislocation. Degenerative changes are noted with within the left  knee, more focal within the patellofemoral compartment.   IMPRESSION:  Asymmetric delayed uptake of radiotracer surrounding the tibial and,  to a lesser extent, femoral components of the right total knee  arthroplasty in keeping with changes of aseptic loosening.   Assessment: ICD-10-CM  1. Status post total right knee replacement Z96.651   Plan:  51. 55 year old  female with prior right total knee arthroplasty with painful total knee. Bone scan shows loosening of the implants. Pain is severe and interfering with activities of daily living. She has had negative work-up for infection. Risks, benefits, complications of a right total knee revision have been discussed with the patient. Patient has agreed and consented to proceed with Dr. Hessie Knows on 02/11/2021.    Electronically signed by Feliberto Gottron, [place] at 02/03/2021 4:37 PM EDT  Reviewed  H+P. No changes noted.

## 2021-02-11 NOTE — Anesthesia Preprocedure Evaluation (Signed)
Anesthesia Evaluation  Patient identified by MRN, date of birth, ID band Patient awake    Reviewed: Allergy & Precautions, H&P , NPO status , Patient's Chart, lab work & pertinent test results, reviewed documented beta blocker date and time   History of Anesthesia Complications Negative for: history of anesthetic complications  Airway Mallampati: III   Neck ROM: full    Dental  (+) Poor Dentition, Dental Advidsory Given   Pulmonary neg pulmonary ROS, shortness of breath and with exertion, sleep apnea , COPD,  COPD inhaler, neg recent URI, Current Smoker,    Pulmonary exam normal        Cardiovascular Exercise Tolerance: Poor hypertension, On Medications (-) angina(-) Past MI and (-) Cardiac Stents negative cardio ROS Normal cardiovascular exam(-) dysrhythmias (-) Valvular Problems/Murmurs Rhythm:regular Rate:Normal     Neuro/Psych  Headaches, PSYCHIATRIC DISORDERS Anxiety Depression Bipolar Disorder  Neuromuscular disease negative psych ROS   GI/Hepatic Neg liver ROS, GERD  Medicated,  Endo/Other  negative endocrine ROSdiabetes, Well Controlled, Type 2, Oral Hypoglycemic Agents  Renal/GU negative Renal ROS  negative genitourinary   Musculoskeletal   Abdominal   Peds  Hematology negative hematology ROS (+)   Anesthesia Other Findings Past Medical History: No date: ADHD (attention deficit hyperactivity disorder) No date: Anxiety No date: AR (allergic rhinitis) No date: Arthritis No date: Benign hypertension     Comment:  NO MEDS No date: Bipolar disorder (HCC) No date: Chronic back pain No date: Chronic constipation No date: Chronic insomnia No date: COPD (chronic obstructive pulmonary disease) (HCC) No date: Deaf     Comment:  RIGHT EAR No date: Decreased dorsalis pedis pulse No date: Depression No date: Diabetes mellitus without complication (HCC)     Comment:  history of DM but no longer has No date:  Dyslipidemia No date: Fatty liver No date: GERD (gastroesophageal reflux disease) No date: Hepatomegaly No date: High cholesterol No date: Hot flashes No date: Migraine with aura No date: OCD (obsessive compulsive disorder) 12/19/2018: Pain in joint, shoulder region No date: Plantar warts 02/18/2019: Sciatica, right side No date: Severe obesity (HCC) No date: Shortness of breath dyspnea No date: Sleep apnea     Comment:  NO CPAP No date: Tobacco use No date: Trochanteric bursitis of right hip Past Surgical History: 10/20/2014: ABDOMINAL HYSTERECTOMY; N/A     Comment:  Procedure: Total abdominial hysterectomy, bilateral               salpingo-oophorectomy;  Surgeon: Brayton Mars,               MD;  Location: [facility];  Service: Gynecology;                Laterality: N/A; No date: BILATERAL SALPINGOOPHORECTOMY No date: bone spurs removed; Bilateral 06/08/2017: CARPAL TUNNEL RELEASE; Left     Comment:  Procedure: CARPAL TUNNEL RELEASE;  Surgeon: Hessie Knows, MD;  Location: [facility];  Service: Orthopedics;               Laterality: Left; 10/10/2017: CARPAL TUNNEL RELEASE; Right     Comment:  Procedure: CARPAL TUNNEL RELEASE;  Surgeon: Hessie Knows, MD;  Location: [facility];  Service: Orthopedics;               Laterality: Right; 09/01/2016: COLONOSCOPY WITH PROPOFOL; N/A     Comment:  Procedure: COLONOSCOPY WITH PROPOFOL;  Surgeon: Jonathon Bellows, MD;  Location: [facility];  Service: Endoscopy;              Laterality: N/A; 06/08/2017: DORSAL COMPARTMENT RELEASE; Left     Comment:  Procedure: RELEASE DORSAL COMPARTMENT (DEQUERVAIN);                Surgeon: Hessie Knows, MD;  Location: [facility];                Service: Orthopedics;  Laterality: Left; 09/01/2016: ESOPHAGOGASTRODUODENOSCOPY (EGD) WITH PROPOFOL; N/A     Comment:  Procedure: ESOPHAGOGASTRODUODENOSCOPY (EGD) WITH               PROPOFOL;  Surgeon: Jonathon Bellows, MD;  Location:  [facility]               ENDOSCOPY;  Service: Endoscopy;  Laterality: N/A; No date: FOOT SURGERY; Bilateral 12/26/2017: INSERTION OF MESH; N/A     Comment:  Procedure: INSERTION OF MESH;  Surgeon: Jules Husbands,               MD;  Location: [facility];  Service: General;  Laterality:               N/A; No date: JOINT REPLACEMENT; Right     Comment:  Total Knee replacement X 2 No date: JOINT REPLACEMENT; Bilateral     Comment:  Total Hip Replacement No date: knee arthroscopo; Right 09/22/2014: LAPAROSCOPY     Comment:  Procedure: LAPAROSCOPY OPERATIVE;  Surgeon: Brayton Mars, MD;  Location: [facility];  Service:               Gynecology;;  excision and fulgeration of endomertriosis No date: LIPOMA EXCISION 12/26/2017: ROBOTIC ASSISTED LAPAROSCOPIC VENTRAL/INCISIONAL HERNIA  REPAIR; N/A     Comment:  Procedure: ROBOTIC ASSISTED LAPAROSCOPIC               [place];  Surgeon: Jules Husbands, MD;  Location: [facility];  Service: General;                Laterality: N/A; No date: TONSILLECTOMY 12/16/2014: TOTAL HIP ARTHROPLASTY; Left     Comment:  Procedure: TOTAL HIP ARTHROPLASTY ANTERIOR APPROACH;                Surgeon: Hessie Knows, MD;  Location: [facility];  Service:              Orthopedics;  Laterality: Left; 05/12/2015: TOTAL HIP ARTHROPLASTY; Right     Comment:  Procedure: TOTAL HIP ARTHROPLASTY ANTERIOR APPROACH;                Surgeon: Hessie Knows, MD;  Location: [facility];  Service:              Orthopedics;  Laterality: Right; 09/22/2015: TOTAL KNEE REVISION; Right     Comment:  Procedure: TOTAL KNEE REVISION/ REVISE POLYIETHYLENE;                Surgeon: Hessie Knows, MD;  Location: [facility];  Service:              Orthopedics;  Laterality: Right; 10/11/2016: TOTAL KNEE REVISION; Right     Comment:  Procedure: TOTAL KNEE  REVISION;  Surgeon: Hessie Knows,              MD;  Location: [facility];  Service: Orthopedics;                 Laterality: Right; No date: TUBAL LIGATION 11/08/2018: VENTRAL HERNIA REPAIR; N/A     Comment:  Procedure: HERNIA REPAIR VENTRAL ADULT OPEN. DIABETIC,               SLEEP APNEA;  Surgeon: Jules Husbands, MD;  Location:               [facility];  Service: General;  Laterality: N/A;   Reproductive/Obstetrics negative OB ROS                             Anesthesia Physical  Anesthesia Plan  ASA: 3  Anesthesia Plan: General   Post-op Pain Management:    Induction: Intravenous  PONV Risk Score and Plan: 2 and Ondansetron, Dexamethasone, Midazolam and Treatment may vary due to age or medical condition  Airway Management Planned: Oral ETT  Additional Equipment:   Intra-op Plan:   Post-operative Plan: Extubation in OR  Informed Consent: I have reviewed the patients History and Physical, chart, labs and discussed the procedure including the risks, benefits and alternatives for the proposed anesthesia with the patient or authorized representative who has indicated his/her understanding and acceptance.     Dental Advisory Given  Plan Discussed with: CRNA  Anesthesia Plan Comments:         Anesthesia Quick Evaluation

## 2021-02-11 NOTE — Anesthesia Procedure Notes (Signed)
Procedure Name: Intubation Date/Time: 02/11/2021 7:25 AM Performed by: Nelda Marseille, CRNA Pre-anesthesia Checklist: Patient identified, Patient being monitored, Timeout performed, Emergency Drugs available and Suction available Patient Re-evaluated:Patient Re-evaluated prior to induction Oxygen Delivery Method: Circle system utilized Preoxygenation: Pre-oxygenation with 100% oxygen Induction Type: IV induction Ventilation: Mask ventilation without difficulty Laryngoscope Size: Mac, 3 and McGraph Grade View: Grade I Tube type: Oral Tube size: 7.0 mm Number of attempts: 1 Airway Equipment and Method: Stylet Placement Confirmation: ETT inserted through vocal cords under direct vision, positive ETCO2 and breath sounds checked- equal and bilateral Secured at: 21 cm Tube secured with: Tape Dental Injury: Teeth and Oropharynx as per pre-operative assessment

## 2021-02-11 NOTE — Evaluation (Signed)
Physical Therapy Evaluation Patient Details Name: Wendy Johnson MRN: [id number] DOB: 1966/03/23 Today's Date: 02/11/2021  History of Present Illness  Wendy Johnson is a 55 y.o. female here today for history and physical for right total knee revision with Dr. Hessie Knows on 02/11/2021. Patient has underwent multiple right knee surgeries. Recent x-rays showed possible loosening, bone scan from [facility] showed uptake of the lateral tibial component, medial aspect of the femoral component and adjacent to the medial tibial component with possible loosening. She is continuing to have pain and swelling that will awaken her at night. Pain is interfering with her ability to be mobile. Pt is now s/p R knee revision 02/11/21.  Clinical Impression  Pt is a pleasant 55 year old female who was admitted for R TKR - revision. Pt performs bed mobility with min assist, transfers with supervision, and ambulation with cga and RW. Pt demonstrates ability to perform 10 SLRs with independence, therefore does not require KI for mobility. Pt demonstrates deficits with strength/mobility/ROM. Very pain limited this date. Would benefit from skilled PT to address above deficits and promote optimal return to PLOF. Recommend transition to [place] upon discharge from acute hospitalization.      Recommendations for follow up therapy are one component of a multi-disciplinary discharge planning process, led by the attending physician.  Recommendations may be updated based on patient status, additional functional criteria and insurance authorization.  Follow Up Recommendations Home health PT    Equipment Recommendations  Rolling walker with 5" wheels;3in1 (PT)    Recommendations for Other Services       Precautions / Restrictions Precautions Precautions: Knee;Fall Precaution Booklet Issued: No Restrictions Weight Bearing Restrictions: Yes RLE Weight Bearing: Weight bearing as tolerated      Mobility  Bed Mobility Overal bed  mobility: Needs Assistance Bed Mobility: Supine to Sit     Supine to sit: Min assist     General bed mobility comments: needs assist for sliding R LE off bed and lowering towards floor. Once seated, upright posture noted    Transfers Overall transfer level: Needs assistance Equipment used: Rolling walker (2 wheeled) Transfers: Sit to/from Stand Sit to Stand: Supervision         General transfer comment: required elevated bed for transfer. Good weight acceptance on R LE  Ambulation/Gait Ambulation/Gait assistance: Min guard Gait Distance (Feet): 3 Feet Assistive device: Rolling walker (2 wheeled) Gait Pattern/deviations: Step-to pattern     General Gait Details: ambulated over to recliner reporting 10/10 pain. Step to antalgic gait pattern noted  Stairs            Wheelchair Mobility    Modified Rankin (Stroke Patients Only)       Balance Overall balance assessment: Needs assistance Sitting-balance support: Feet supported Sitting balance-Leahy Scale: Good     Standing balance support: Bilateral upper extremity supported Standing balance-Leahy Scale: Good                               Pertinent Vitals/Pain Pain Assessment: 0-10 Pain Score: 10-Worst pain ever Pain Location: R knee Pain Descriptors / Indicators: Operative site guarding Pain Intervention(s): Limited activity within patient's tolerance;Repositioned;Ice applied    Home Living Family/patient expects to be discharged to:: Private residence Living Arrangements:  (boyfriend) Available Help at Discharge: Friend(s) Type of Home: House Home Access: Stairs to enter Entrance Stairs-Rails: Can reach both Entrance Stairs-Number of Steps: 3 Home Layout: One level Home Equipment:  None Additional Comments: reports she lost her previous equipment from prior surgeries.    Prior Function Level of Independence: Independent               Hand Dominance        Extremity/Trunk  Assessment   Upper Extremity Assessment Upper Extremity Assessment: Overall WFL for tasks assessed    Lower Extremity Assessment Lower Extremity Assessment: Generalized weakness (R LE grossly 3/5)       Communication   Communication: No difficulties  Cognition Arousal/Alertness: Awake/alert Behavior During Therapy: WFL for tasks assessed/performed Overall Cognitive Status: Within Functional Limits for tasks assessed                                        General Comments      Exercises Total Joint Exercises Goniometric ROM: R knee AAROM: 18-50 degrees Other Exercises Other Exercises: supine therex performed on R LE including AP, QS, SLRs, and hip ab/add. 10 reps performed with min assist   Assessment/Plan    PT Assessment Patient needs continued PT services  PT Problem List Decreased strength;Decreased balance;Decreased mobility;Pain       PT Treatment Interventions DME instruction;Gait training;Therapeutic exercise;Stair training    PT Goals (Current goals can be found in the Care Plan section)  Acute Rehab PT Goals Patient Stated Goal: to go home PT Goal Formulation: With patient Time For Goal Achievement: 02/25/21 Potential to Achieve Goals: Good    Frequency BID   Barriers to discharge        Co-evaluation               AM-PAC PT "6 Clicks" Mobility  Outcome Measure Help needed turning from your back to your side while in a flat bed without using bedrails?: A Little Help needed moving from lying on your back to sitting on the side of a flat bed without using bedrails?: A Little Help needed moving to and from a bed to a chair (including a wheelchair)?: A Little Help needed standing up from a chair using your arms (e.g., wheelchair or bedside chair)?: A Little Help needed to walk in hospital room?: A Little Help needed climbing 3-5 steps with a railing? : A Little 6 Click Score: 18    End of Session Equipment Utilized During  Treatment: Gait belt Activity Tolerance: Patient limited by pain Patient left: in chair;with chair alarm set;with SCD's reapplied Nurse Communication: Mobility status PT Visit Diagnosis: Muscle weakness (generalized) (M62.81);Difficulty in walking, not elsewhere classified (R26.2);Pain Pain - Right/Left: Right Pain - part of body: Knee    Time: 5945-8592 PT Time Calculation (min) (ACUTE ONLY): 43 min   Charges:   PT Evaluation $PT Eval Low Complexity: 1 Low PT Treatments $Therapeutic Exercise: 23-37 mins        Greggory Stallion, PT, DPT [phone]   Alasha Mcguinness 02/11/2021, 4:46 PM

## 2021-02-11 NOTE — Transfer of Care (Signed)
Immediate Anesthesia Transfer of Care Note  Patient: Wendy Johnson  Procedure(s) Performed: TOTAL KNEE REVISION (Right: Knee)  Patient Location: PACU  Anesthesia Type:General  Level of Consciousness: awake and sedated  Airway & Oxygen Therapy: Patient Spontanous Breathing and Patient connected to face mask oxygen  Post-op Assessment: Report given to RN and Post -op Vital signs reviewed and stable  Post vital signs: Reviewed and stable  Last Vitals:  Vitals Value Taken Time  BP 107/90 02/11/21 1056  Temp    Pulse 106 02/11/21 1059  Resp 19 02/11/21 1059  SpO2 95 % 02/11/21 1059  Vitals shown include unvalidated device data.  Last Pain:  Vitals:   02/11/21 0654  TempSrc: Temporal  PainSc: 9       Patients Stated Pain Goal: 3 (10/30/74 2831)  Complications: No notable events documented.

## 2021-02-12 LAB — CBC
HCT: 33.6 % — ABNORMAL LOW (ref 36.0–46.0)
Hemoglobin: 11 g/dL — ABNORMAL LOW (ref 12.0–15.0)
MCH: 28.9 pg (ref 26.0–34.0)
MCHC: 32.7 g/dL (ref 30.0–36.0)
MCV: 88.4 fL (ref 80.0–100.0)
Platelets: 245 10*3/uL (ref 150–400)
RBC: 3.8 MIL/uL — ABNORMAL LOW (ref 3.87–5.11)
RDW: 14.2 % (ref 11.5–15.5)
WBC: 9.3 10*3/uL (ref 4.0–10.5)
nRBC: 0 % (ref 0.0–0.2)

## 2021-02-12 LAB — BASIC METABOLIC PANEL
Anion gap: 7 (ref 5–15)
BUN: <5 mg/dL — ABNORMAL LOW (ref 6–20)
CO2: 30 mmol/L (ref 22–32)
Calcium: 8.6 mg/dL — ABNORMAL LOW (ref 8.9–10.3)
Chloride: 101 mmol/L (ref 98–111)
Creatinine, Ser: 0.53 mg/dL (ref 0.44–1.00)
GFR, Estimated: >60 mL/min (ref >60)
Glucose, Bld: 95 mg/dL (ref 70–99)
Potassium: 3.2 mmol/L — ABNORMAL LOW (ref 3.5–5.1)
Sodium: 138 mmol/L (ref 135–145)

## 2021-02-12 MED ORDER — POLYETHYLENE GLYCOL 3350 17 G PO PACK
17.0000 g | PACK | Freq: Every day | ORAL | 0 refills | Status: DC | PRN
Start: 2021-02-12 — End: 2021-05-19

## 2021-02-12 MED ORDER — ENOXAPARIN SODIUM 40 MG/0.4ML IJ SOSY: 40.0000 mg | PREFILLED_SYRINGE | INTRAMUSCULAR | 0 refills | Status: DC

## 2021-02-12 MED ORDER — OXYCODONE HCL 10 MG PO TABS: 10.0000 mg | ORAL_TABLET | ORAL | 0 refills | Status: DC | PRN

## 2021-02-12 MED ORDER — NICOTINE 21 MG/24HR TD PT24
21.0000 mg | MEDICATED_PATCH | Freq: Every day | TRANSDERMAL | Status: DC
  Filled 2021-02-12: qty 1

## 2021-02-12 MED ORDER — POTASSIUM CHLORIDE 20 MEQ PO PACK
20.0000 meq | PACK | Freq: Three times a day (TID) | ORAL | Status: DC
  Administered 2021-02-12: 20 meq via ORAL
  Filled 2021-02-12: qty 1

## 2021-02-12 MED ORDER — OXYCODONE HCL ER 10 MG PO T12A
20.0000 mg | EXTENDED_RELEASE_TABLET | Freq: Two times a day (BID) | ORAL | Status: DC
  Administered 2021-02-12: 20 mg via ORAL
  Filled 2021-02-12: qty 2

## 2021-02-12 MED ORDER — OXYCODONE HCL ER 20 MG PO T12A: 20.0000 mg | EXTENDED_RELEASE_TABLET | Freq: Two times a day (BID) | ORAL | 0 refills | Status: AC

## 2021-02-12 NOTE — Discharge Instructions (Signed)

## 2021-02-12 NOTE — TOC Progression Note (Signed)
Transition of Care [facility]) - Progression Note    Patient Details  Name: Wendy Johnson MRN: [id number] Date of Birth: 1966-02-06  Transition of Care [facility]) CM/SW [place], RN Phone Number: 02/12/2021, 12:21 PM  Clinical Narrative:   Met with the patient to discuss DC plan and needs She lives at home with her Boyfriend\ She has old DME will need a 3 in 1 and rolling walker She want them to be delivered to her home, I notified Rhonda at [place], She is set up with [place] for [facility] services, she stated she would like to have a PCA temporarily, I explained that is covered and set up by Medicaid, I encouraged her to call her medicaid worker and speak to them about it, she has transportation and can afford her medication         Expected Discharge Plan and Services           Expected Discharge Date: 02/12/21                                     Social Determinants of Health (SDOH) Interventions    Readmission Risk Interventions No flowsheet data found.

## 2021-02-12 NOTE — Discharge Summary (Signed)
Physician Discharge Summary  Patient ID: Wendy Johnson MRN: [id number] DOB/AGE: 1965-11-25 55 y.o.  Admit date: 02/11/2021 Discharge date: 02/12/2021  Admission Diagnoses:  S/P revision of total knee, right [Z96.651]   Discharge Diagnoses: Patient Active Problem List   Diagnosis Date Noted   S/P revision of total knee, right 02/11/2021   Esophageal dysphagia    History of colonic polyps    Sciatica, right side 02/18/2019   Pain in joint, shoulder region 12/19/2018   Unspecified inflammatory spondylopathy, lumbar region ([place]) 10/04/2018   BMI 40.0-44.9, adult ([place]) 08/17/2018   Lumbar spondylosis 03/19/2018   Polyarthralgia 03/19/2018   History of alcoholism ([place]) 07/19/2017   Bilateral carpal tunnel syndrome 05/29/2017   Painful total knee replacement, right ([place]) 10/11/2016   Rotator cuff tendinitis, right 07/29/2016   Occipital neuralgia 10/12/2015   Instability of prosthetic knee ([place]) 09/22/2015   Primary osteoarthritis of right hip 05/12/2015   Sleep apnea 04/07/2015   Primary osteoarthritis of left hip 93/79/0240   Metabolic syndrome 97/35/3299   Migraine without aura and without status migrainosus, not intractable 11/26/2014   GERD without esophagitis 11/26/2014   COPD, moderate ([place]) 11/26/2014   Nocturnal oxygen desaturation 11/26/2014   Supplemental oxygen dependent 11/26/2014   Hearing loss 11/26/2014   Pain syndrome, chronic 11/26/2014   History of hypertension 11/26/2014   Dyslipidemia 11/26/2014   Morbid obesity ([place]) 11/26/2014   Chronic constipation 11/26/2014   Generalized anxiety disorder 11/11/2014   DDD (degenerative disc disease), lumbar 11/11/2014   Facet arthritis of lumbar region 11/11/2014   Primary osteoarthritis involving multiple joints 11/11/2014   H/O hysterectomy for benign disease 10/20/2014   Low back derangement syndrome 09/30/2014   Bipolar disorder ([place]) 09/30/2014    Past Medical History:  Diagnosis Date   ADHD (attention  deficit hyperactivity disorder)    Anxiety    AR (allergic rhinitis)    Arthritis    Benign hypertension    NO MEDS   Bipolar disorder (HCC)    Chronic back pain    Chronic constipation    Chronic insomnia    COPD (chronic obstructive pulmonary disease) ([place])    Deaf    RIGHT EAR   Decreased dorsalis pedis pulse    Depression    Dyslipidemia    Fatty liver    GERD (gastroesophageal reflux disease)    Hepatomegaly    High cholesterol    Hot flashes    Migraine with aura    OCD (obsessive compulsive disorder)    Pain in joint, shoulder region 12/19/2018   Plantar warts    Sciatica, right side 02/18/2019   Severe obesity (HCC)    Shortness of breath dyspnea    Sleep apnea    NO CPAP   Tobacco use    Trochanteric bursitis of right hip      Transfusion: none   Consultants (if any):   Discharged Condition: Improved  Hospital Course: Wendy Johnson is an 54 y.o. female who was admitted 02/11/2021 with a diagnosis of painful right total knee and went to the operating room on 02/11/2021 and underwent the above named procedures.    Surgeries: Procedure(s): TOTAL KNEE REVISION on 02/11/2021 Patient tolerated the surgery well. Taken to PACU where she was stabilized and then transferred to the orthopedic floor.  Started on Lovenox 30 mg q 12 hrs. Foot pumps applied bilaterally at 80 mm. Heels elevated on bed with rolled towels. No evidence of DVT. Negative [place]. Physical therapy started on day #1 for  gait training and transfer. OT started day #1 for ADL and assisted devices.  Patient's foley was d/c on day #1. Patient's IV was d/c on day #1.  On post op day #1 patient was stable and ready for discharge to home with HHPT.  Implants: Medacta revision total knee system GM K size three 80m distal augments with L 30 mm femoral sleeve 15 x 105 stem on the femur 3 right PS femur cemented tibial side was a 2 right with 5 mm medial and lateral augments 11 x 105 stem cemented with an  extra small 25 mm tibial cone 17 mm Slinger implant all components cemented patella was left in place from prior surgery  She was given perioperative antibiotics:  Anti-infectives (From admission, onward)    Start     Dose/Rate Route Frequency Ordered Stop   02/11/21 1330  ceFAZolin (ANCEF) IVPB 2g/100 mL premix        2 g 200 mL/hr over 30 Minutes Intravenous Every 6 hours 02/11/21 1132 02/13/21 0129   02/11/21 1036  vancomycin (VANCOCIN) powder  Status:  Discontinued          As needed 02/11/21 1036 02/11/21 1049   02/11/21 0611  ceFAZolin (ANCEF) 2-4 GM/100ML-% IVPB       Note to Pharmacy: RNorton Blizzard : cabinet override      02/11/21 0611 02/11/21 0749   02/11/21 0600  ceFAZolin (ANCEF) IVPB 2g/100 mL premix        2 g 200 mL/hr over 30 Minutes Intravenous On call to O.R. 02/11/21 0308 02/11/21 0745     .  She was given sequential compression devices, early ambulation, and Lovenox TEDs for DVT prophylaxis.  She benefited maximally from the hospital stay and there were no complications.    Recent vital signs:  Vitals:   02/12/21 0747 02/12/21 1100  BP: (!) 153/97 (!) 183/96  Pulse: 87 (!) 101  Resp: 20 19  Temp: 97.8 F (36.6 C) (!) 97.3 F (36.3 C)  SpO2: 96% 96%    Recent laboratory studies:  Lab Results  Component Value Date   HGB 11.0 (L) 02/12/2021   HGB 12.1 02/11/2021   HGB 15.7 (H) 02/03/2021   Lab Results  Component Value Date   WBC 9.3 02/12/2021   PLT 245 02/12/2021   Lab Results  Component Value Date   INR 0.97 10/04/2016   Lab Results  Component Value Date   NA 138 02/12/2021   K 3.2 (L) 02/12/2021   CL 101 02/12/2021   CO2 30 02/12/2021   BUN <5 (L) 02/12/2021   CREATININE 0.53 02/12/2021   GLUCOSE 95 02/12/2021    Discharge Medications:   Allergies as of 02/12/2021       Reactions   Codeine Nausea Only   Imitrex [sumatriptan] Other (See Comments)   chest pain        Medication List     STOP taking these medications     hydrOXYzine 50 MG tablet Commonly known as: ATARAX/VISTARIL   ibuprofen 800 MG tablet Commonly known as: ADVIL   Oxycodone HCl 10 MG Tabs Replaced by: oxyCODONE 20 mg 12 hr tablet You also have another medication with the same name that you need to continue taking as instructed.       TAKE these medications    amphetamine-dextroamphetamine 30 MG tablet Commonly known as: ADDERALL Take 30 mg by mouth 2 (two) times daily.   amphetamine-dextroamphetamine 15 MG tablet Commonly known as: ADDERALL Take 15  mg by mouth daily in the afternoon.   BC FAST PAIN RELIEF ARTHRITIS PO Take 1 packet by mouth 2 (two) times daily as needed (pain).   busPIRone 30 MG tablet Commonly known as: BUSPAR Take 30 mg by mouth 2 (two) times daily.   CALCIUM 600 + D PO Take 1 tablet by mouth 2 (two) times daily.   clonazePAM 1 MG tablet Commonly known as: KLONOPIN Take 1-2 mg by mouth 6 (six) times daily.   cyclobenzaprine 10 MG tablet Commonly known as: FLEXERIL Take 10 mg by mouth 3 (three) times daily as needed for muscle spasms.   dexlansoprazole 60 MG capsule Commonly known as: Dexilant Take 1 capsule (60 mg total) by mouth daily.   enoxaparin 40 MG/0.4ML injection Commonly known as: LOVENOX Inject 0.4 mLs (40 mg total) into the skin daily for 14 days.   gabapentin 300 MG capsule Commonly known as: NEURONTIN Take 300 mg by mouth daily.   GOLD BOND FOOT EX Apply 1 application topically daily.   Latuda 80 MG Tabs tablet Generic drug: lurasidone Take 80 mg by mouth daily.   naloxone 4 MG/0.1ML Liqd nasal spray kit Commonly known as: NARCAN As directed for opioid induced respiratory depression   oxyCODONE 20 mg 12 hr tablet Commonly known as: OXYCONTIN Take 1 tablet (20 mg total) by mouth every 12 (twelve) hours for 7 days. What changed: You were already taking a medication with the same name, and this prescription was added. Make sure you understand how and when to take  each. Replaces: Oxycodone HCl 10 MG Tabs   Oxycodone HCl 10 MG Tabs Take 1-1.5 tablets (10-15 mg total) by mouth every 4 (four) hours as needed for severe pain (pain score 7-10). What changed:  how much to take reasons to take this Another medication with the same name was removed. Continue taking this medication, and follow the directions you see here.   polyethylene glycol 17 g packet Commonly known as: MIRALAX / GLYCOLAX Take 17 g by mouth daily as needed for mild constipation.   ProAir HFA 108 (90 Base) MCG/ACT inhaler Generic drug: albuterol INHALE 2 PUFFS BY MOUTH 4 TIMES A DAY What changed:  See the new instructions. Another medication with the same name was removed. Continue taking this medication, and follow the directions you see here.   sertraline 100 MG tablet Commonly known as: ZOLOFT Take 200 mg by mouth daily.   traZODone 100 MG tablet Commonly known as: DESYREL Take 400 mg by mouth at bedtime.   umeclidinium-vilanterol 62.5-25 MCG/INH Aepb Commonly known as: ANORO ELLIPTA Inhale 1 puff into the lungs daily at 6 (six) AM. In place of Spiriva   valsartan 80 MG tablet Commonly known as: DIOVAN Take 1 tablet (80 mg total) by mouth daily.               Durable Medical Equipment  (From admission, onward)           Start     Ordered   02/11/21 1159  DME Walker rolling  Once       Question Answer Comment  Walker: With 5 Inch Wheels   Patient needs a walker to treat with the following condition S/P revision of total knee, right      02/11/21 1158   02/11/21 1159  DME 3 n 1  Once        02/11/21 1158   02/11/21 1159  DME Bedside commode  Once  Question:  Patient needs a bedside commode to treat with the following condition  Answer:  S/P revision of total knee, right   02/11/21 1158            Diagnostic Studies: DG Knee 1-2 Views Right  Result Date: 02/11/2021 CLINICAL DATA:  Postop total right knee replacement EXAM: RIGHT KNEE - 1-2  VIEW COMPARISON:  Radiograph 12/04/2020 FINDINGS: Postsurgical changes of constrained right total knee arthroplasty revision with antibiotic bead placement. Intact hardware without evidence of loosening or periprosthetic fracture. Expected soft tissue changes. IMPRESSION: Postsurgical changes of constrained right total knee arthroplasty revision with antibiotic bead placement. No evidence of immediate hardware complication. Electronically Signed   By: Maurine Simmering M.D.   On: 02/11/2021 12:18    Disposition:      Follow-up Information     Duanne Guess, PA-C Follow up in 2 week(s).   Specialties: Orthopedic Surgery, Emergency Medicine Contact information: [place] [address] [phone]                  Signed: Feliberto Gottron 02/12/2021, 11:35 AM

## 2021-02-12 NOTE — Evaluation (Signed)
Occupational Therapy Evaluation Patient Details Name: Wendy Johnson MRN: [id number] DOB: [birth year] Today's Date: 02/12/2021   History of Present Illness Wendy Johnson is a 55 y.o. female here today for history and physical for right total knee revision with Dr. Hessie Knows on 02/11/2021. Patient has underwent multiple right knee surgeries. Recent x-rays showed possible loosening, bone [facility] showed uptake of the lateral tibial component, medial aspect of the femoral component and adjacent to the medial tibial component with possible loosening. She is continuing to have pain and swelling that will awaken her at night. Pain is interfering with her ability to be mobile. Pt is now s/p R knee revision 02/11/21.   Clinical Impression   Pt seen for OT evaluation this date, POD#1 from above surgery. Pt was independent in all ADL prior to surgery and is eager to return to PLOF with less pain and improved safety and independence. Pt currently demonstrates ability to perform LB dressing and toileting with mod indep (assist only with wound vac during LB dressing), and will require assist for compression stocking mgt and polar care mgt. Pt reports her boyfriend can assist. Pt instructed in polar care mgt, falls prevention strategies, home/routines modifications, DME/AE for LB bathing and dressing tasks, and compression stocking mgt. Handout provided to support recall and carryover. All education/training provided at time of initial evaluation. Do not currently anticipate any OT needs following this hospitalization. Will sign off.    Recommendations for follow up therapy are one component of a multi-disciplinary discharge planning process, led by the attending physician.  Recommendations may be updated based on patient status, additional functional criteria and insurance authorization.   Follow Up Recommendations  No OT follow up    Equipment Recommendations  3 in 1 bedside commode    Recommendations  for Other Services       Precautions / Restrictions Precautions Precautions: Knee;Fall Precaution Booklet Issued: Yes (comment) Restrictions Weight Bearing Restrictions: Yes RLE Weight Bearing: Weight bearing as tolerated      Mobility Bed Mobility              General bed mobility comments: NT, up in recliner    Transfers Overall transfer level: Needs assistance Equipment used: Rolling walker (2 wheeled) Transfers: Sit to/from Stand Sit to Stand: Supervision             Balance Overall balance assessment: Needs assistance Sitting-balance support: Feet supported;No upper extremity supported Sitting balance-Leahy Scale: Normal     Standing balance support: During functional activity;No upper extremity supported Standing balance-Leahy Scale: Good Standing balance comment: Able to perform sit to stand without UE assist and stabilize in standing with close SBA                           ADL either performed or assessed with clinical judgement   ADL Overall ADL's : Modified independent                                       General ADL Comments: Pt performed LB dressing with set up and supervision (assist only 2/2 wound vac mgt), supervision for ADL transfers + RW from recliner and from [facility]. Mod indep with lateral lean pericare. Pt will require assist for compression stockings at home which she reports her boyfriend can assist with.     Vision  Perception     Praxis      Pertinent Vitals/Pain Pain Assessment: 0-10 Pain Score: 10-Worst pain ever Pain Location: R knee with movement Pain Descriptors / Indicators: Operative site guarding;Aching Pain Intervention(s): Limited activity within patient's tolerance;Monitored during session;Premedicated before session;Repositioned;Ice applied;RN gave pain meds during session     Hand Dominance     Extremity/Trunk Assessment Upper Extremity Assessment Upper Extremity  Assessment: Overall WFL for tasks assessed   Lower Extremity Assessment Lower Extremity Assessment: RLE deficits/detail RLE Deficits / Details: grossly 3/5       Communication Communication Communication: No difficulties   Cognition Arousal/Alertness: Awake/alert Behavior During Therapy: WFL for tasks assessed/performed Overall Cognitive Status: Within Functional Limits for tasks assessed                                     General Comments       Exercises Other Exercises: Pt instructed in knee positioning to improve passive knee extension, falls prevention, precautions, home/routines modifications, AE/DME, compression stockings, and polar care mgt. Handout provided.   Shoulder Instructions      Home Living Family/patient expects to be discharged to:: Private residence Living Arrangements: Spouse/significant other (boyfriend) Available Help at Discharge: Friend(s) Type of Home: House Home Access: Stairs to enter [employer] of Steps: 3 Entrance Stairs-Rails: Can reach both Home Layout: One level               Home Equipment: None   Additional Comments: reports she lost her previous equipment from prior surgeries.      Prior Functioning/Environment Level of Independence: Independent                 OT Problem List: Decreased strength;Pain;Decreased range of motion      OT Treatment/Interventions:      OT Goals(Current goals can be found in the care plan section) Acute Rehab OT Goals Patient Stated Goal: to go home OT Goal Formulation: All assessment and education complete, DC therapy Time For Goal Achievement: 02/12/21  OT Frequency:     Barriers to D/C:            Co-evaluation              AM-PAC OT "6 Clicks" Daily Activity     Outcome Measure Help from another person eating meals?: None Help from another person taking care of personal grooming?: None Help from another person toileting, which includes using  toliet, bedpan, or urinal?: None Help from another person bathing (including washing, rinsing, drying)?: A Little Help from another person to put on and taking off regular upper body clothing?: None Help from another person to put on and taking off regular lower body clothing?: A Little 6 Click Score: 22   End of Session    Activity Tolerance: Patient tolerated treatment well Patient left: in chair;with call bell/phone within reach;with chair alarm set;Other (comment) (wound vac and polar care in place)  OT Visit Diagnosis: Other abnormalities of gait and mobility (R26.89);Pain Pain - Right/Left: Right Pain - part of body: Knee                Time: 1017-5102 OT Time Calculation (min): 28 min Charges:  OT General Charges $OT Visit: 1 Visit OT Evaluation $OT Eval Moderate Complexity: 1 Mod OT Treatments $Self Care/Home Management : 8-22 mins  Ardeth Perfect., MPH, MS, OTR/L ascom [phone] 02/12/21, 12:35 PM

## 2021-02-12 NOTE — Plan of Care (Signed)

## 2021-02-12 NOTE — Plan of Care (Signed)

## 2021-02-12 NOTE — Progress Notes (Signed)
   Subjective: 1 Day Post-Op Procedure(s) (LRB): TOTAL KNEE REVISION (Right) Patient reports pain as severe.   Patient is well, and has had no acute complaints or problems Denies any CP, SOB, ABD pain. We will continue therapy today.  Plan is to go Home after hospital stay.  Objective: Vital signs in last 24 hours: Temp:  [97.2 F (36.2 C)-98.4 F (36.9 C)] 97.8 F (36.6 C) (10/07 0747) Pulse Rate:  [87-110] 87 (10/07 0747) Resp:  [16-20] 20 (10/07 0747) BP: (107-173)/(71-97) 153/97 (10/07 0747) SpO2:  [90 %-99 %] 96 % (10/07 0747)  Intake/Output from previous day: 10/06 0701 - 10/07 0700 In: 2200 [I.V.:2000; IV Piggyback:200] Out: 8225 [Urine:7975; Drains:150; Blood:100] Intake/Output this shift: No intake/output data recorded.  Recent Labs    02/11/21 1219 02/12/21 0552  HGB 12.1 11.0*   Recent Labs    02/11/21 1219 02/12/21 0552  WBC 7.3 9.3  RBC 4.02 3.80*  HCT 36.3 33.6*  PLT 188 245   Recent Labs    02/11/21 1219 02/12/21 0552  NA  --  138  K  --  3.2*  CL  --  101  CO2  --  30  BUN  --  <5*  CREATININE 0.56 0.53  GLUCOSE  --  95  CALCIUM  --  8.6*   No results for input(s): LABPT, INR in the last 72 hours.  EXAM General - Patient is Alert, Appropriate, and Oriented Extremity - Neurovascular intact Sensation intact distally Intact pulses distally Dorsiflexion/Plantar flexion intact Dressing - dressing C/D/I, prevena intact with 150 cc drainage Motor Function - intact, moving foot and toes well on exam.   Past Medical History:  Diagnosis Date   ADHD (attention deficit hyperactivity disorder)    Anxiety    AR (allergic rhinitis)    Arthritis    Benign hypertension    NO MEDS   Bipolar disorder (HCC)    Chronic back pain    Chronic constipation    Chronic insomnia    COPD (chronic obstructive pulmonary disease) (HCC)    Deaf    RIGHT EAR   Decreased dorsalis pedis pulse    Depression    Dyslipidemia    Fatty liver    GERD  (gastroesophageal reflux disease)    Hepatomegaly    High cholesterol    Hot flashes    Migraine with aura    OCD (obsessive compulsive disorder)    Pain in joint, shoulder region 12/19/2018   Plantar warts    Sciatica, right side 02/18/2019   Severe obesity (HCC)    Shortness of breath dyspnea    Sleep apnea    NO CPAP   Tobacco use    Trochanteric bursitis of right hip     Assessment/Plan:   1 Day Post-Op Procedure(s) (LRB): TOTAL KNEE REVISION (Right) Active Problems:   S/P revision of total knee, right  Estimated body mass index is 41.59 kg/m as calculated from the following:   Height as of this encounter: 5\' 4"  (1.626 m).   Weight as of this encounter: 109.9 kg. Advance diet Up with therapy Work on BM Hypokalemia - start oral K  Pain controlled VSS CM to assist with discharge to home with HHPT  DVT Prophylaxis - Lovenox, Foot Pumps, and TED hose Weight-Bearing as tolerated to right leg   T. Rachelle Hora, PA-C [place] 02/12/2021, 8:14 AM

## 2021-02-12 NOTE — Progress Notes (Signed)
Discharge instructions were reviewed with the patient. Wound vac switched to [place]. PIV was removed. Patient was given their pain medication prescriptions.

## 2021-02-12 NOTE — Progress Notes (Signed)
Physical Therapy Treatment Patient Details Name: Wendy Johnson MRN: [id number] DOB: [birth year] Today's Date: 02/12/2021   History of Present Illness Wendy Johnson is a 55 y.o. female here today for history and physical for right total knee revision with Dr. Hessie Knows on 02/11/2021. Patient has underwent multiple right knee surgeries. Recent x-rays showed possible loosening, bone scan from [facility] showed uptake of the lateral tibial component, medial aspect of the femoral component and adjacent to the medial tibial component with possible loosening. She is continuing to have pain and swelling that will awaken her at night. Pain is interfering with her ability to be mobile. Pt is now s/p R knee revision 02/11/21.    PT Comments    Pt demonstrating improvements in her mobility this morning POD #1 R knee revision. Pt demonstrated ability to transfer with SPV, ambulated >150 ft with RW + CGA and verbal cues for normalized gait pattern, and negotiated 4 steps with B handrails + CGA for safety. Pt ambulates with L foot internally rotated at baseline. Pt currently lacking 25 degree of R knee extension and was instructed on proper positioning to promote extension. Pt will need RW prior to discharging home. Plan to d/c home with home health PT services to improve functional mobility, ROM, and strength.    Recommendations for follow up therapy are one component of a multi-disciplinary discharge planning process, led by the attending physician.  Recommendations may be updated based on patient status, additional functional criteria and insurance authorization.  Follow Up Recommendations  Home health PT     Equipment Recommendations  Rolling walker with 5" wheels    Recommendations for Other Services       Precautions / Restrictions Precautions Precautions: Knee;Fall Precaution Booklet Issued: Yes (comment) Restrictions Weight Bearing Restrictions: Yes RLE Weight Bearing: Weight bearing as tolerated      Mobility  Bed Mobility Overal bed mobility: Modified Independent             General bed mobility comments: NT, up in recliner    Transfers Overall transfer level: Needs assistance Equipment used: Rolling walker (2 wheeled) Transfers: Sit to/from Stand Sit to Stand: Supervision         General transfer comment: Able to stand from bed and recliner chair multiple times throughout session with SPV and RW.  Ambulation/Gait Ambulation/Gait assistance: Min guard Gait Distance (Feet): 200 Feet Assistive device: Rolling walker (2 wheeled) Gait Pattern/deviations: Step-through pattern;Antalgic;Narrow base of support;Decreased weight shift to right     General Gait Details: Chronic L toes internally rotated. Cues for improved heel strike with R foot to encourage increased R knee extension and to normalize gait pattern.   Stairs Stairs: Yes Stairs assistance: Min guard Stair Management: Two rails;Step to pattern;Forwards Number of Stairs: 4 General stair comments: Verbal cues for sequencing. Instruction on guarding techniques for caregiver at discharge. No caregiver present to practice stair climbing   Wheelchair Mobility    Modified Rankin (Stroke Patients Only)       Balance Overall balance assessment: Needs assistance Sitting-balance support: Feet supported;No upper extremity supported Sitting balance-Leahy Scale: Normal     Standing balance support: During functional activity;No upper extremity supported Standing balance-Leahy Scale: Good Standing balance comment: Able to perform sit to stand without UE assist and stabilize in standing with close SBA         Cognition Arousal/Alertness: Awake/alert Behavior During Therapy: WFL for tasks assessed/performed Overall Cognitive Status: Within Functional Limits for tasks assessed  Exercises Total Joint Exercises Ankle Circles/Pumps: AROM;Both;20 reps;Supine Quad Sets: AROM;10  reps;Supine;Right Heel Slides: AAROM;Right;15 reps;Supine Long Arc Quad: AROM;Strengthening;Right;10 reps;Seated Goniometric ROM: R knee AROM: 25 - 72 Other Exercises Other Exercises: Education provided regarding positioning to promote R knee extension. Education and demonstration of stair climbing and safety. Other Exercises: Pt instructed in knee positioning to improve passive knee extension, falls prevention, precautions, home/routines modifications, AE/DME, compression stockings, and polar care mgt. Handout provided.    General Comments        Pertinent Vitals/Pain Pain Assessment: 0-10 Pain Score: 10-Worst pain ever Pain Location: R knee with movement Pain Descriptors / Indicators: Operative site guarding;Aching Pain Intervention(s): Limited activity within patient's tolerance;Monitored during session;Premedicated before session;Repositioned;Ice applied;RN gave pain meds during session    [place] expects to be discharged to:: Private residence Living Arrangements: Spouse/significant other (boyfriend) Available Help at Discharge: Friend(s) Type of Home: House Home Access: Stairs to enter Entrance Stairs-Rails: Can reach both Home Layout: One level Home Equipment: None Additional Comments: reports she lost her previous equipment from prior surgeries.    Prior Function Level of Independence: Independent          PT Goals (current goals can now be found in the care plan section) Acute Rehab PT Goals Patient Stated Goal: to go home PT Goal Formulation: With patient Time For Goal Achievement: 02/25/21 Potential to Achieve Goals: Good Progress towards PT goals: Progressing toward goals    Frequency    BID      PT Plan Current plan remains appropriate    Co-evaluation              AM-PAC PT "6 Clicks" Mobility   Outcome Measure  Help needed turning from your back to your side while in a flat bed without using bedrails?: None Help needed  moving from lying on your back to sitting on the side of a flat bed without using bedrails?: A Little Help needed moving to and from a bed to a chair (including a wheelchair)?: A Little Help needed standing up from a chair using your arms (e.g., wheelchair or bedside chair)?: A Little Help needed to walk in hospital room?: A Little Help needed climbing 3-5 steps with a railing? : A Little 6 Click Score: 19    End of Session Equipment Utilized During Treatment: Gait belt Activity Tolerance: Patient tolerated treatment well Patient left: in chair;with call bell/phone within reach;with chair alarm set;with SCD's reapplied Nurse Communication: Mobility status PT Visit Diagnosis: Muscle weakness (generalized) (M62.81);Difficulty in walking, not elsewhere classified (R26.2);Pain Pain - Right/Left: Right Pain - part of body: Knee     Time: 7096-2836 PT Time Calculation (min) (ACUTE ONLY): 32 min  Charges:  $Gait Training: 8-22 mins $Therapeutic Activity: 8-22 mins                     Andrey Campanile, SPT  Andrey Campanile 02/12/2021, 12:54 PM

## 2021-02-12 NOTE — Anesthesia Postprocedure Evaluation (Signed)
Anesthesia Post Note  Patient: Wendy Johnson  Procedure(s) Performed: TOTAL KNEE REVISION (Right: Knee)  Patient location during evaluation: PACU Anesthesia Type: General Level of consciousness: awake and alert Pain management: pain level controlled Vital Signs Assessment: post-procedure vital signs reviewed and stable Respiratory status: spontaneous breathing, nonlabored ventilation, respiratory function stable and patient connected to nasal cannula oxygen Cardiovascular status: blood pressure returned to baseline and stable Postop Assessment: no apparent nausea or vomiting Anesthetic complications: no   No notable events documented.   Last Vitals:  Vitals:   02/12/21 0434 02/12/21 0747  BP: (!) 145/90 (!) 153/97  Pulse: 87 87  Resp: 16 20  Temp: 36.5 C 36.6 C  SpO2: 92% 96%    Last Pain:  Vitals:   02/12/21 0747  TempSrc: Oral  PainSc:                  [name]

## 2021-02-16 LAB — AEROBIC/ANAEROBIC CULTURE W GRAM STAIN (SURGICAL/DEEP WOUND)
Culture: NO GROWTH
Gram Stain: NONE SEEN

## 2021-03-23 ENCOUNTER — Encounter: Payer: Self-pay | Admitting: Anesthesiology

## 2021-03-23 ENCOUNTER — Ambulatory Visit: Payer: Medicare Other | Attending: Anesthesiology | Admitting: Anesthesiology

## 2021-03-23 ENCOUNTER — Other Ambulatory Visit: Payer: Self-pay

## 2021-03-23 DIAGNOSIS — M25551 Pain in right hip: Secondary | ICD-10-CM | POA: Diagnosis not present

## 2021-03-23 DIAGNOSIS — M47816 Spondylosis without myelopathy or radiculopathy, lumbar region: Secondary | ICD-10-CM | POA: Diagnosis not present

## 2021-03-23 DIAGNOSIS — G894 Chronic pain syndrome: Secondary | ICD-10-CM | POA: Diagnosis not present

## 2021-03-23 DIAGNOSIS — M25552 Pain in left hip: Secondary | ICD-10-CM | POA: Diagnosis not present

## 2021-03-23 DIAGNOSIS — M545 Low back pain, unspecified: Secondary | ICD-10-CM

## 2021-03-23 DIAGNOSIS — M542 Cervicalgia: Secondary | ICD-10-CM

## 2021-03-23 DIAGNOSIS — M5136 Other intervertebral disc degeneration, lumbar region: Secondary | ICD-10-CM | POA: Diagnosis not present

## 2021-03-23 DIAGNOSIS — M5431 Sciatica, right side: Secondary | ICD-10-CM | POA: Diagnosis not present

## 2021-03-23 DIAGNOSIS — F1190 Opioid use, unspecified, uncomplicated: Secondary | ICD-10-CM

## 2021-03-23 MED ORDER — OXYCODONE HCL 10 MG PO TABS: 10.0000 mg | ORAL_TABLET | ORAL | 0 refills | Status: DC

## 2021-03-23 MED ORDER — OXYCODONE HCL 10 MG PO TABS: 10.0000 mg | ORAL_TABLET | ORAL | 0 refills | Status: AC | PRN

## 2021-03-23 NOTE — Progress Notes (Signed)
Virtual Visit via Telephone Note  I connected with Wendy Johnson on 03/23/21 at  1:40 PM EST by telephone and verified that I am speaking with the correct person using two identifiers.  Location: Patient: Home Provider: Pain control center   I discussed the limitations, risks, security and privacy concerns of performing an evaluation and management service by telephone and the availability of in person appointments. I also discussed with the patient that there may be a patient responsible charge related to this service. The patient expressed understanding and agreed to proceed.   History of Present Illness: I spoke with Wendy Johnson regarding her low back pain and hip pain.  She has been doing reasonably well following her epidural back in June and continues to have low back pain and intermittent sciatica symptoms worse on the right leg.  This is her baseline.  She has recently had right knee surgery additionally and this has obviously exacerbated her condition.  Otherwise she is in her usual state of health no new changes in the quality characteristic or distribution of her low back pain or leg pain.  She is taking her oxycodone approximately every 4-6 hours averaging of 5 tablets/day and getting good relief with these.  She denies any side effects and maintains that she gets approximately 50 to 70% relief with the tablets.  She has failed other more conservative therapy.  She also has an upper respiratory infection at present which is currently being treated and is considering a repeat epidural in the early new year.  She is doing her stretching strengthening exercises as requested but otherwise appears to be doing reasonably well.   Review of systems: General: No fevers or chills Pulmonary: No shortness of breath or dyspnea Cardiac: No angina or palpitations or lightheadedness GI: No abdominal pain or constipation Psych: No depression   Observations/Objective:  Current Outpatient  Medications:    [START ON 05/07/2021] Oxycodone HCl 10 MG TABS, Take 1 tablet (10 mg total) by mouth every 4 (four) hours., Disp: 150 tablet, Rfl: 0   amphetamine-dextroamphetamine (ADDERALL) 15 MG tablet, Take 15 mg by mouth daily in the afternoon., Disp: , Rfl:    amphetamine-dextroamphetamine (ADDERALL) 30 MG tablet, Take 30 mg by mouth 2 (two) times daily., Disp: , Rfl:    Aspirin-Caffeine (BC FAST PAIN RELIEF ARTHRITIS PO), Take 1 packet by mouth 2 (two) times daily as needed (pain)., Disp: , Rfl:    busPIRone (BUSPAR) 30 MG tablet, Take 30 mg by mouth 2 (two) times daily. , Disp: , Rfl:    Calcium Carb-Cholecalciferol (CALCIUM 600 + D PO), Take 1 tablet by mouth 2 (two) times daily., Disp: , Rfl:    clonazePAM (KLONOPIN) 1 MG tablet, Take 1-2 mg by mouth 6 (six) times daily., Disp: , Rfl:    cyclobenzaprine (FLEXERIL) 10 MG tablet, Take 10 mg by mouth 3 (three) times daily as needed for muscle spasms., Disp: , Rfl:    dexlansoprazole (DEXILANT) 60 MG capsule, Take 1 capsule (60 mg total) by mouth daily., Disp: 90 capsule, Rfl: 1   enoxaparin (LOVENOX) 40 MG/0.4ML injection, Inject 0.4 mLs (40 mg total) into the skin daily for 14 days., Disp: 5.6 mL, Rfl: 0   gabapentin (NEURONTIN) 300 MG capsule, Take 300 mg by mouth daily. (Patient not taking: Reported on 02/03/2021), Disp: , Rfl:    LATUDA 80 MG TABS tablet, Take 80 mg by mouth daily., Disp: , Rfl:    naloxone (NARCAN) nasal spray 4 mg/0.1 mL, As  directed for opioid induced respiratory depression, Disp: 1 each, Rfl: 1   [START ON 04/07/2021] Oxycodone HCl 10 MG TABS, Take 1 tablet (10 mg total) by mouth every 4 (four) hours as needed (pain score 7-10)., Disp: 150 tablet, Rfl: 0   Podiatric Products (GOLD BOND FOOT EX), Apply 1 application topically daily., Disp: , Rfl:    polyethylene glycol (MIRALAX / GLYCOLAX) 17 g packet, Take 17 g by mouth daily as needed for mild constipation., Disp: 14 each, Rfl: 0   PROAIR HFA 108 (90 Base) MCG/ACT  inhaler, INHALE 2 PUFFS BY MOUTH 4 TIMES A DAY (Patient taking differently: Inhale 2 puffs into the lungs every 6 (six) hours as needed for wheezing or shortness of breath.), Disp: 8.5 g, Rfl: 0   sertraline (ZOLOFT) 100 MG tablet, Take 200 mg by mouth daily., Disp: , Rfl:    traZODone (DESYREL) 100 MG tablet, Take 400 mg by mouth at bedtime. , Disp: , Rfl:    umeclidinium-vilanterol (ANORO ELLIPTA) 62.5-25 MCG/INH AEPB, Inhale 1 puff into the lungs daily at 6 (six) AM. In place of Spiriva, Disp: 3 each, Rfl: 1   valsartan (DIOVAN) 80 MG tablet, Take 1 tablet (80 mg total) by mouth daily., Disp: 90 tablet, Rfl: 1   Assessment and Plan: 1. Chronic pain syndrome   2. DDD (degenerative disc disease), lumbar   3. Low back pain at multiple sites   4. Sciatica, right side   5. Pain of both hip joints   6. Chronic, continuous use of opioids   7. Facet arthritis of lumbar region   8. Cervicalgia   8 based on our discussion today and after review of the [facility] practitioner database information it is appropriate for refills for her medications dated for November 30 and December 30.  This will be for every 4 hour tablets for 150.  We will have her follow-up with [place] in 2 months.  She is to continue follow-up with Dr. Rudene Christians for her knee and other physicians for baseline medical care.  Ordered continue efforts at stretching strengthening and weight loss as reviewed again today.  Smoking cessation as requested.  No other changes in her pain management protocol will be initiated.  Follow Up Instructions:    I discussed the assessment and treatment plan with the patient. The patient was provided an opportunity to ask questions and all were answered. The patient agreed with the plan and demonstrated an understanding of the instructions.   The patient was advised to call back or seek an in-person evaluation if the symptoms worsen or if the condition fails to improve as anticipated.  I provided 30 minutes  of non-face-to-face time during this encounter.   Molli Barrows, MD

## 2021-04-19 ENCOUNTER — Ambulatory Visit: Payer: Self-pay

## 2021-04-19 NOTE — Telephone Encounter (Signed)
  Chief Complaint: SOB Symptoms: SOB, chest congestion Frequency: over a week Pertinent Negatives: NA Disposition: [] ED /[] Urgent Care (no appt availability in office) / [] Appointment(In office/virtual)/ []  [facility]/ [] Home Care/ [] Refused Recommended Disposition  Additional Notes: pt wanting to come in to office for appt, says she's been like this before and she feels like she needs an abx at this point. No available appt in timely manner so called and spoke with Cassandra, Dresser who was able to speak with pt and give appt date.     Reason for Disposition  [1] Longstanding difficulty breathing (e.g., CHF, COPD, emphysema) AND [2] WORSE than normal  Answer Assessment - Initial Assessment Questions 1. RESPIRATORY STATUS: "Describe your breathing?" (e.g., wheezing, shortness of breath, unable to speak, severe coughing)      SOB 2. ONSET: "When did this breathing problem begin?"      Over a week 3. PATTERN "Does the difficult breathing come and go, or has it been constant since it started?"      Comes and go 4. SEVERITY: "How bad is your breathing?" (e.g., mild, moderate, severe)    - MILD: No SOB at rest, mild SOB with walking, speaks normally in sentences, can lie down, no retractions, pulse < 100.    - MODERATE: SOB at rest, SOB with minimal exertion and prefers to sit, cannot lie down flat, speaks in phrases, mild retractions, audible wheezing, pulse 100-120.    - SEVERE: Very SOB at rest, speaks in single words, struggling to breathe, sitting hunched forward, retractions, pulse > 120      Mild  5. RECURRENT SYMPTOM: "Have you had difficulty breathing before?" If Yes, ask: "When was the last time?" and "What happened that time?"      Yes been a while 6. CARDIAC HISTORY: "Do you have any history of heart disease?" (e.g., heart attack, angina, bypass surgery, angioplasty)      No 7. LUNG HISTORY: "Do you have any history of lung disease?"  (e.g., pulmonary embolus, asthma,  emphysema)     COPD 8. CAUSE: "What do you think is causing the breathing problem?"      unsure 9. OTHER SYMPTOMS: "Do you have any other symptoms? (e.g., dizziness, runny nose, cough, chest pain, fever)     Chest congestion, sore throat and cough 10. O2 SATURATION MONITOR:  "Do you use an oxygen saturation monitor (pulse oximeter) at home?" If Yes, "What is your reading (oxygen level) today?" "What is your usual oxygen saturation reading?" (e.g., 95%)       NO  Protocols used: Breathing Difficulty-A-AH

## 2021-04-20 ENCOUNTER — Ambulatory Visit (INDEPENDENT_AMBULATORY_CARE_PROVIDER_SITE_OTHER): Payer: Medicare Other | Admitting: Internal Medicine

## 2021-04-20 ENCOUNTER — Encounter: Payer: Self-pay | Admitting: Internal Medicine

## 2021-04-20 VITALS — BP 124/78 | HR 111 | Resp 18 | Ht 64.0 in | Wt 234.7 lb

## 2021-04-20 DIAGNOSIS — J069 Acute upper respiratory infection, unspecified: Secondary | ICD-10-CM | POA: Diagnosis not present

## 2021-04-20 DIAGNOSIS — J441 Chronic obstructive pulmonary disease with (acute) exacerbation: Secondary | ICD-10-CM | POA: Diagnosis not present

## 2021-04-20 MED ORDER — PREDNISONE 20 MG PO TABS: 20.0000 mg | ORAL_TABLET | Freq: Two times a day (BID) | ORAL | 0 refills | Status: AC

## 2021-04-20 MED ORDER — FLUTICASONE PROPIONATE 50 MCG/ACT NA SUSP: 2.0000 | Freq: Every day | NASAL | 6 refills | Status: DC

## 2021-04-20 MED ORDER — AZITHROMYCIN 250 MG PO TABS
ORAL_TABLET | ORAL | 0 refills | Status: AC
Start: 2021-04-20 — End: 2021-04-25

## 2021-04-20 MED ORDER — ALBUTEROL SULFATE HFA 108 (90 BASE) MCG/ACT IN AERS: 2.0000 | INHALATION_SPRAY | Freq: Four times a day (QID) | RESPIRATORY_TRACT | 0 refills | Status: DC | PRN

## 2021-04-20 NOTE — Progress Notes (Signed)
Acute Office Visit  Subjective:    Patient ID: Wendy Johnson, female    DOB: 1966/03/31, 55 y.o.   MRN: [id number]  Chief Complaint  Patient presents with   Cough    W/ SOB    HPI Patient is in today for cough/SOB x 6 days, cough, fatigue, nasal congestion. Using Anoro daily. Does not have a rescue inhaler currently.   URI Compliant:  -Worst symptom:  -Fever: yes, highest 103 4 days ago -Myalgias: yes -Cough: yes increased sputum production, green -Shortness of breath: yes constant  -Wheezing: yes -Chest pain: no -Chest tightness: no -Chest congestion: yes -Nasal congestion: yes -Runny nose: yes -Post nasal drip: yes -Sneezing: yes -Sore throat: yes -Sinus pressure: yes -Face pain: yes -Ear pain: yes left -Ear pressure: yes left  -Vomiting: no -Diarrhea: yes -Fatigue: yes -Sick contacts: no -Context:  same -Relief with OTC cold/cough medications: no  -Treatments attempted: cold/sinus, Mucinex, Ibuprofen    Past Medical History:  Diagnosis Date   ADHD (attention deficit hyperactivity disorder)    Anxiety    AR (allergic rhinitis)    Arthritis    Benign hypertension    NO MEDS   Bipolar disorder (HCC)    Chronic back pain    Chronic constipation    Chronic insomnia    COPD (chronic obstructive pulmonary disease) (HCC)    Deaf    RIGHT EAR   Decreased dorsalis pedis pulse    Depression    Dyslipidemia    Fatty liver    GERD (gastroesophageal reflux disease)    Hepatomegaly    High cholesterol    Hot flashes    Migraine with aura    OCD (obsessive compulsive disorder)    Pain in joint, shoulder region 12/19/2018   Plantar warts    Sciatica, right side 02/18/2019   Severe obesity (HCC)    Shortness of breath dyspnea    Sleep apnea    NO CPAP   Tobacco use    Trochanteric bursitis of right hip     Past Surgical History:  Procedure Laterality Date   ABDOMINAL HYSTERECTOMY N/A 10/20/2014   Procedure: Total abdominial hysterectomy, bilateral  salpingo-oophorectomy;  Surgeon: Brayton Mars, MD;  Location: [facility];  Service: Gynecology;  Laterality: N/A;   BILATERAL SALPINGOOPHORECTOMY     bone spurs removed Bilateral    CARPAL TUNNEL RELEASE Left 06/08/2017   Procedure: CARPAL TUNNEL RELEASE;  Surgeon: Hessie Knows, MD;  Location: [facility];  Service: Orthopedics;  Laterality: Left;   CARPAL TUNNEL RELEASE Right 10/10/2017   Procedure: CARPAL TUNNEL RELEASE;  Surgeon: Hessie Knows, MD;  Location: [facility];  Service: Orthopedics;  Laterality: Right;   COLONOSCOPY WITH PROPOFOL N/A 09/01/2016   Procedure: COLONOSCOPY WITH PROPOFOL;  Surgeon: Jonathon Bellows, MD;  Location: [facility];  Service: Endoscopy;  Laterality: N/A;   COLONOSCOPY WITH PROPOFOL N/A 07/30/2019   Procedure: COLONOSCOPY WITH PROPOFOL;  Surgeon: Lucilla Lame, MD;  Location: [facility];  Service: Endoscopy;  Laterality: N/A;   DORSAL COMPARTMENT RELEASE Left 06/08/2017   Procedure: RELEASE DORSAL COMPARTMENT (DEQUERVAIN);  Surgeon: Hessie Knows, MD;  Location: [facility];  Service: Orthopedics;  Laterality: Left;   ESOPHAGOGASTRODUODENOSCOPY (EGD) WITH PROPOFOL N/A 09/01/2016   Procedure: ESOPHAGOGASTRODUODENOSCOPY (EGD) WITH PROPOFOL;  Surgeon: Jonathon Bellows, MD;  Location: [facility];  Service: Endoscopy;  Laterality: N/A;   ESOPHAGOGASTRODUODENOSCOPY (EGD) WITH PROPOFOL N/A 07/30/2019   Procedure: ESOPHAGOGASTRODUODENOSCOPY (EGD) WITH PROPOFOL;  Surgeon: Lucilla Lame, MD;  Location: [facility];  Service:  Endoscopy;  Laterality: N/A;   FOOT SURGERY Bilateral    INSERTION OF MESH N/A 12/26/2017   Procedure: INSERTION OF MESH;  Surgeon: Jules Husbands, MD;  Location: [facility];  Service: General;  Laterality: N/A;   JOINT REPLACEMENT Right    Total Knee replacement X 2   JOINT REPLACEMENT Bilateral    Total Hip Replacement   knee arthroscopo Right    LAPAROSCOPY  09/22/2014   Procedure: LAPAROSCOPY OPERATIVE;  Surgeon: Brayton Mars, MD;  Location:  [facility];  Service: Gynecology;;  excision and fulgeration of endomertriosis   LIPOMA EXCISION     ROBOTIC ASSISTED LAPAROSCOPIC VENTRAL/INCISIONAL HERNIA REPAIR N/A 12/26/2017   Procedure: ROBOTIC ASSISTED LAPAROSCOPIC VENTRAL/INCISIONAL [place];  Surgeon: Jules Husbands, MD;  Location: [facility];  Service: General;  Laterality: N/A;   TONSILLECTOMY     TOTAL HIP ARTHROPLASTY Left 12/16/2014   Procedure: TOTAL HIP ARTHROPLASTY ANTERIOR APPROACH;  Surgeon: Hessie Knows, MD;  Location: [facility];  Service: Orthopedics;  Laterality: Left;   TOTAL HIP ARTHROPLASTY Right 05/12/2015   Procedure: TOTAL HIP ARTHROPLASTY ANTERIOR APPROACH;  Surgeon: Hessie Knows, MD;  Location: [facility];  Service: Orthopedics;  Laterality: Right;   TOTAL KNEE REVISION Right 09/22/2015   Procedure: TOTAL KNEE REVISION/ REVISE POLYIETHYLENE;  Surgeon: Hessie Knows, MD;  Location: [facility];  Service: Orthopedics;  Laterality: Right;   TOTAL KNEE REVISION Right 10/11/2016   Procedure: TOTAL KNEE REVISION;  Surgeon: Hessie Knows, MD;  Location: [facility];  Service: Orthopedics;  Laterality: Right;   TOTAL KNEE REVISION Right 02/11/2021   Procedure: TOTAL KNEE REVISION;  Surgeon: Hessie Knows, MD;  Location: [facility];  Service: Orthopedics;  Laterality: Right;   TUBAL LIGATION     VENTRAL HERNIA REPAIR N/A 11/08/2018   Procedure: HERNIA REPAIR VENTRAL ADULT OPEN. DIABETIC, SLEEP APNEA;  Surgeon: Jules Husbands, MD;  Location: [facility];  Service: General;  Laterality: N/A;    Family History  Problem Relation Age of Onset   Diabetes Mother    Heart disease Father    Heart attack Father    Diabetes Sister    Breast cancer Maternal Aunt        <50   Breast cancer Paternal Aunt        x2.  <50   Healthy Son    Drug abuse Sister     Social History   Socioeconomic History   Marital status: Divorced    Spouse name: Not on file   Number of children: 1   Years of education: Not on file   Highest education level: 10th  grade  Occupational History   Occupation: disabled  Tobacco Use   Smoking status: Every Day    Packs/day: 0.50    Years: 37.00    Pack years: 18.50    Types: Cigarettes    Start date: 11/26/1979   Smokeless tobacco: Never  Vaping Use   Vaping Use: Never used  Substance and Sexual Activity   Alcohol use: No    Alcohol/week: 0.0 standard drinks    Comment: but used to be a heavy drinker, quit after DUI 2021   Drug use: No    Comment: quit crack cocaine 2004   Sexual activity: Yes    Partners: Male    Birth control/protection: None, Surgical  Other Topics Concern   Not on file  Social History Narrative   Lives with boyfriend   Disable from psychiatric disease   Social Determinants of Health  Financial Resource Strain: Not on file  Food Insecurity: Not on file  Transportation Needs: Not on file  Physical Activity: Not on file  Stress: Not on file  Social Connections: Not on file  Intimate Partner Violence: Not on file    Outpatient Medications Prior to Visit  Medication Sig Dispense Refill   amphetamine-dextroamphetamine (ADDERALL) 15 MG tablet Take 15 mg by mouth daily in the afternoon.     amphetamine-dextroamphetamine (ADDERALL) 30 MG tablet Take 30 mg by mouth 2 (two) times daily.     Aspirin-Caffeine (BC FAST PAIN RELIEF ARTHRITIS PO) Take 1 packet by mouth 2 (two) times daily as needed (pain).     busPIRone (BUSPAR) 30 MG tablet Take 30 mg by mouth 2 (two) times daily.      Calcium Carb-Cholecalciferol (CALCIUM 600 + D PO) Take 1 tablet by mouth 2 (two) times daily.     clonazePAM (KLONOPIN) 1 MG tablet Take 1-2 mg by mouth 6 (six) times daily.     cyclobenzaprine (FLEXERIL) 10 MG tablet Take 10 mg by mouth 3 (three) times daily as needed for muscle spasms.     dexlansoprazole (DEXILANT) 60 MG capsule Take 1 capsule (60 mg total) by mouth daily. 90 capsule 1   enoxaparin (LOVENOX) 40 MG/0.4ML injection Inject 0.4 mLs (40 mg total) into the skin daily for 14 days.  5.6 mL 0   gabapentin (NEURONTIN) 300 MG capsule Take 300 mg by mouth daily. (Patient not taking: Reported on 02/03/2021)     LATUDA 80 MG TABS tablet Take 80 mg by mouth daily.     naloxone (NARCAN) nasal spray 4 mg/0.1 mL As directed for opioid induced respiratory depression 1 each 1   Oxycodone HCl 10 MG TABS Take 1 tablet (10 mg total) by mouth every 4 (four) hours as needed (pain score 7-10). 150 tablet 0   [START ON 05/07/2021] Oxycodone HCl 10 MG TABS Take 1 tablet (10 mg total) by mouth every 4 (four) hours. 150 tablet 0   Podiatric Products (GOLD BOND FOOT EX) Apply 1 application topically daily.     polyethylene glycol (MIRALAX / GLYCOLAX) 17 g packet Take 17 g by mouth daily as needed for mild constipation. 14 each 0   PROAIR HFA 108 (90 Base) MCG/ACT inhaler INHALE 2 PUFFS BY MOUTH 4 TIMES A DAY (Patient taking differently: Inhale 2 puffs into the lungs every 6 (six) hours as needed for wheezing or shortness of breath.) 8.5 g 0   sertraline (ZOLOFT) 100 MG tablet Take 200 mg by mouth daily.     traZODone (DESYREL) 100 MG tablet Take 400 mg by mouth at bedtime.      umeclidinium-vilanterol (ANORO ELLIPTA) 62.5-25 MCG/INH AEPB Inhale 1 puff into the lungs daily at 6 (six) AM. In place of Spiriva 3 each 1   valsartan (DIOVAN) 80 MG tablet Take 1 tablet (80 mg total) by mouth daily. 90 tablet 1   No facility-administered medications prior to visit.    Allergies  Allergen Reactions   Codeine Nausea Only   Imitrex [Sumatriptan] Other (See Comments)    chest pain    Review of Systems  Constitutional:  Positive for chills, fatigue and fever.  HENT:  Positive for congestion, ear pain, postnasal drip, rhinorrhea, sinus pain and sore throat. Negative for ear discharge and tinnitus.   Respiratory:  Positive for cough, shortness of breath and wheezing.   Cardiovascular:  Negative for chest pain and palpitations.  Gastrointestinal:  Positive for diarrhea. Negative  for abdominal pain, nausea  and vomiting.  Musculoskeletal:  Positive for myalgias.  Neurological:  Negative for dizziness and headaches.      Objective:    Physical Exam Constitutional:      Appearance: Normal appearance. She is ill-appearing.  HENT:     Head: Normocephalic and atraumatic.     Right Ear: Tympanic membrane, ear canal and external ear normal.     Left Ear: Tympanic membrane, ear canal and external ear normal.     Nose: Congestion present.     Mouth/Throat:     Mouth: Mucous membranes are moist.     Pharynx: Oropharynx is clear.     Comments: PND present Eyes:     Conjunctiva/sclera: Conjunctivae normal.  Cardiovascular:     Rate and Rhythm: Normal rate and regular rhythm.  Pulmonary:     Effort: Pulmonary effort is normal.     Breath sounds: Normal breath sounds.     Comments: Inspiratory wheezes at the bases Musculoskeletal:     Right lower leg: No edema.     Left lower leg: No edema.  Skin:    General: Skin is warm and dry.  Neurological:     General: No focal deficit present.     Mental Status: She is alert. Mental status is at baseline.  Psychiatric:        Mood and Affect: Mood normal.        Behavior: Behavior normal.    BP 124/78   Pulse (!) 111   Resp 18   Ht 5' 4"  (1.626 m)   Wt 234 lb 11.2 oz (106.5 kg)   LMP 09/26/2014 Comment: Total  SpO2 97%   BMI 40.29 kg/m  Wt Readings from Last 3 Encounters:  02/11/21 242 lb 4.6 oz (109.9 kg)  02/03/21 242 lb 4.6 oz (109.9 kg)  12/14/20 232 lb (105.2 kg)    Health Maintenance Due  Topic Date Due   Hepatitis C Screening  Never done   Zoster Vaccines- Shingrix (1 of 2) Never done   Pneumococcal Vaccine 34-66 Years old (3 - PPSV23 if available, else PCV20) 01/10/2015   TETANUS/TDAP  11/20/2019   COVID-19 Vaccine (4 - Booster for Pfizer series) 07/14/2020   MAMMOGRAM  10/02/2020   INFLUENZA VACCINE  12/07/2020    There are no preventive care reminders to display for this patient.   Lab Results  Component Value  Date   TSH 0.769 02/03/2016   Lab Results  Component Value Date   WBC 9.3 02/12/2021   HGB 11.0 (L) 02/12/2021   HCT 33.6 (L) 02/12/2021   MCV 88.4 02/12/2021   PLT 245 02/12/2021   Lab Results  Component Value Date   NA 138 02/12/2021   K 3.2 (L) 02/12/2021   CO2 30 02/12/2021   GLUCOSE 95 02/12/2021   BUN <5 (L) 02/12/2021   CREATININE 0.53 02/12/2021   BILITOT 0.6 02/03/2021   ALKPHOS 75 02/03/2021   AST 20 02/03/2021   ALT 18 02/03/2021   PROT 7.7 02/03/2021   ALBUMIN 4.2 02/03/2021   CALCIUM 8.6 (L) 02/12/2021   ANIONGAP 7 02/12/2021   EGFR 106 11/16/2020   Lab Results  Component Value Date   CHOL 175 11/16/2020   Lab Results  Component Value Date   HDL 39 (L) 11/16/2020   Lab Results  Component Value Date   LDLCALC 106 (H) 11/16/2020   Lab Results  Component Value Date   TRIG 188 (H) 11/16/2020   Lab Results  Component Value Date   CHOLHDL 4.5 11/16/2020   Lab Results  Component Value Date   HGBA1C 4.9 11/16/2020       Assessment & Plan:   1. COPD exacerbation (HCC)/Upper respiratory tract infection, unspecified type: Test for COVID/flu today. Continue to use Anoro daily, Albuterol as needed for wheezing. Treat symptomatically with nasal steroid, NSAIDs. As she does have moderate COPD with increased sputum production, change in color and shortness of breath, she will be treated with Prednisone 40 mg x 5 days as well as Azithromycin as she has tolerated this well in the past. Follow up as needed or if symptoms worsen or fail to improve. Update* patient left prior to COVID/flu testing.   - albuterol (VENTOLIN HFA) 108 (90 Base) MCG/ACT inhaler; Inhale 2 puffs into the lungs every 6 (six) hours as needed for wheezing or shortness of breath.  Dispense: 8 g; Refill: 0 - fluticasone (FLONASE) 50 MCG/ACT nasal spray; Place 2 sprays into both nostrils daily.  Dispense: 16 g; Refill: 6 - predniSONE (DELTASONE) 20 MG tablet; Take 1 tablet (20 mg total) by  mouth 2 (two) times daily with a meal for 5 days.  Dispense: 10 tablet; Refill: 0 - azithromycin (ZITHROMAX) 250 MG tablet; Take 2 tablets on day 1, then 1 tablet daily on days 2 through 5  Dispense: 6 tablet; Refill: 0 - POC Influenza A&B (Binax test) - Novel Coronavirus, NAA ([facility])   Teodora Medici, DO

## 2021-04-28 DIAGNOSIS — Z96651 Presence of right artificial knee joint: Secondary | ICD-10-CM | POA: Diagnosis not present

## 2021-05-07 NOTE — Progress Notes (Deleted)
Name: Wendy Johnson   MRN: [id number]    DOB: March 10, 1966   Date:05/07/2021       Progress Note  Subjective  Chief Complaint  Follow Up  HPI  RUQ: she states she has noticed intermittent RUQ pain over the past 8 months, she states this episode started 3 hours after she ate dinner ( chicken pot pie and cucumbers), pain is sharp, intense and kept her up at night, does not radiate. Worse when she applies pressure, had chills, but no fever, appetite is normal ( she ate steak this morning) , no change in bowel movements, no rashes. She has nausea but no vomiting. She has pain medications at home ( for chronic pain syndrome) but does not control this pain.   Hives: she states it is intermittent, she states when she is stressed it is worse, this episode started a few days ago, taking benadryl prn  GERD: she continues to have heart burn, had normal EGD back in the Spring of 2021, discussed avoiding trigger foods and nsaids, she states Dexilant works well for her   HTN: she is taking Diovan 80 mg and denies chest pain or palpitation. She is compliant and denies side effects    History of DM : A1C has been normal for a while now and is off medication Denies polyphagia, polydipsia or polyuria. She still has low HDL, LDL elevated and triglycerides up. She has not been taking Atorvastatin or Lovaza, we will recheck labs next visit    Dyslipidemia:  She has high triglycerides , low HDl and high LDL, she has been off medications, we will recheck it today. She states cholesterol medication was given her headaches   DDD lumbar spine: she sees pain clinic - Dr Andree Elk, she is on Oxycodone 10 mg QID, denies constipation - controlled with Benifiber, she has narcan at home. She also has OA of knee and shoulders and sees Ortho at [facility] and has steroid injections . Stable.    Morbid obesity: her maximum weight was around 273 lbs, she lost down to 184 lbs with physical activity and life style modification,  she gradually gained weight back, went up to 250 lbs, today is down to 244 lbs She is cutting down on portion size, but is drinking sodas again.    Bipolar disorder: on multiple medications , given by Dr. Kasandra Knudsen . Reviewed medications with her . Unchanged, came in with her sister today    COPD : she states that she could not tolerate Breo, she has been on Spiriva, no cough or SOB, she states Spiriva is too high, we will try Anoro instead, she has not been using oxygen at night because she does not like using it, states too big. Uses albuterol on machine and prn when at home. Denies daily cough, she has occasional wheeze at night.    Alcohol history: she completely quit Feb 2017 but had a relapse, she has quit again since June 2020 , unchanged   Patient Active Problem List   Diagnosis Date Noted   S/P revision of total knee, right 02/11/2021   Esophageal dysphagia    History of colonic polyps    Sciatica, right side 02/18/2019   Pain in joint, shoulder region 12/19/2018   Unspecified inflammatory spondylopathy, lumbar region ([place]) 10/04/2018   BMI 40.0-44.9, adult ([place]) 08/17/2018   Lumbar spondylosis 03/19/2018   Polyarthralgia 03/19/2018   History of alcoholism ([place]) 07/19/2017   Bilateral carpal tunnel syndrome 05/29/2017   Painful total  knee replacement, right ([place]) 10/11/2016   Rotator cuff tendinitis, right 07/29/2016   Occipital neuralgia 10/12/2015   Instability of prosthetic knee ([place]) 09/22/2015   Primary osteoarthritis of right hip 05/12/2015   Sleep apnea 04/07/2015   Primary osteoarthritis of left hip 12/20/4816   Metabolic syndrome 56/31/4970   Migraine without aura and without status migrainosus, not intractable 11/26/2014   GERD without esophagitis 11/26/2014   COPD, moderate ([place]) 11/26/2014   Nocturnal oxygen desaturation 11/26/2014   Supplemental oxygen dependent 11/26/2014   Hearing loss 11/26/2014   Pain syndrome, chronic 11/26/2014   History of hypertension  11/26/2014   Dyslipidemia 11/26/2014   Morbid obesity ([place]) 11/26/2014   Chronic constipation 11/26/2014   Generalized anxiety disorder 11/11/2014   DDD (degenerative disc disease), lumbar 11/11/2014   Facet arthritis of lumbar region 11/11/2014   Primary osteoarthritis involving multiple joints 11/11/2014   H/O hysterectomy for benign disease 10/20/2014   Low back derangement syndrome 09/30/2014   Bipolar disorder ([place]) 09/30/2014    Past Surgical History:  Procedure Laterality Date   ABDOMINAL HYSTERECTOMY N/A 10/20/2014   Procedure: Total abdominial hysterectomy, bilateral salpingo-oophorectomy;  Surgeon: Brayton Mars, MD;  Location: [facility];  Service: Gynecology;  Laterality: N/A;   BILATERAL SALPINGOOPHORECTOMY     bone spurs removed Bilateral    CARPAL TUNNEL RELEASE Left 06/08/2017   Procedure: CARPAL TUNNEL RELEASE;  Surgeon: Hessie Knows, MD;  Location: [facility];  Service: Orthopedics;  Laterality: Left;   CARPAL TUNNEL RELEASE Right 10/10/2017   Procedure: CARPAL TUNNEL RELEASE;  Surgeon: Hessie Knows, MD;  Location: [facility];  Service: Orthopedics;  Laterality: Right;   COLONOSCOPY WITH PROPOFOL N/A 09/01/2016   Procedure: COLONOSCOPY WITH PROPOFOL;  Surgeon: Jonathon Bellows, MD;  Location: [facility];  Service: Endoscopy;  Laterality: N/A;   COLONOSCOPY WITH PROPOFOL N/A 07/30/2019   Procedure: COLONOSCOPY WITH PROPOFOL;  Surgeon: Lucilla Lame, MD;  Location: [facility] ENDOSCOPY;  Service: Endoscopy;  Laterality: N/A;   DORSAL COMPARTMENT RELEASE Left 06/08/2017   Procedure: RELEASE DORSAL COMPARTMENT (DEQUERVAIN);  Surgeon: Hessie Knows, MD;  Location: [facility];  Service: Orthopedics;  Laterality: Left;   ESOPHAGOGASTRODUODENOSCOPY (EGD) WITH PROPOFOL N/A 09/01/2016   Procedure: ESOPHAGOGASTRODUODENOSCOPY (EGD) WITH PROPOFOL;  Surgeon: Jonathon Bellows, MD;  Location: [facility];  Service: Endoscopy;  Laterality: N/A;   ESOPHAGOGASTRODUODENOSCOPY (EGD) WITH PROPOFOL N/A  07/30/2019   Procedure: ESOPHAGOGASTRODUODENOSCOPY (EGD) WITH PROPOFOL;  Surgeon: Lucilla Lame, MD;  Location: [facility] ENDOSCOPY;  Service: Endoscopy;  Laterality: N/A;   FOOT SURGERY Bilateral    INSERTION OF MESH N/A 12/26/2017   Procedure: INSERTION OF MESH;  Surgeon: Jules Husbands, MD;  Location: [facility];  Service: General;  Laterality: N/A;   JOINT REPLACEMENT Right    Total Knee replacement X 2   JOINT REPLACEMENT Bilateral    Total Hip Replacement   knee arthroscopo Right    LAPAROSCOPY  09/22/2014   Procedure: LAPAROSCOPY OPERATIVE;  Surgeon: Brayton Mars, MD;  Location: [facility];  Service: Gynecology;;  excision and fulgeration of endomertriosis   LIPOMA EXCISION     ROBOTIC ASSISTED LAPAROSCOPIC VENTRAL/INCISIONAL HERNIA REPAIR N/A 12/26/2017   Procedure: ROBOTIC ASSISTED LAPAROSCOPIC VENTRAL/INCISIONAL [place];  Surgeon: Jules Husbands, MD;  Location: [facility];  Service: General;  Laterality: N/A;   TONSILLECTOMY     TOTAL HIP ARTHROPLASTY Left 12/16/2014   Procedure: TOTAL HIP ARTHROPLASTY ANTERIOR APPROACH;  Surgeon: Hessie Knows, MD;  Location: [facility];  Service: Orthopedics;  Laterality: Left;   TOTAL HIP  ARTHROPLASTY Right 05/12/2015   Procedure: TOTAL HIP ARTHROPLASTY ANTERIOR APPROACH;  Surgeon: Hessie Knows, MD;  Location: [facility];  Service: Orthopedics;  Laterality: Right;   TOTAL KNEE REVISION Right 09/22/2015   Procedure: TOTAL KNEE REVISION/ REVISE POLYIETHYLENE;  Surgeon: Hessie Knows, MD;  Location: [facility];  Service: Orthopedics;  Laterality: Right;   TOTAL KNEE REVISION Right 10/11/2016   Procedure: TOTAL KNEE REVISION;  Surgeon: Hessie Knows, MD;  Location: [facility];  Service: Orthopedics;  Laterality: Right;   TOTAL KNEE REVISION Right 02/11/2021   Procedure: TOTAL KNEE REVISION;  Surgeon: Hessie Knows, MD;  Location: [facility];  Service: Orthopedics;  Laterality: Right;   TUBAL LIGATION     VENTRAL HERNIA REPAIR N/A 11/08/2018   Procedure: HERNIA REPAIR  VENTRAL ADULT OPEN. DIABETIC, SLEEP APNEA;  Surgeon: Jules Husbands, MD;  Location: [facility];  Service: General;  Laterality: N/A;    Family History  Problem Relation Age of Onset   Diabetes Mother    Heart disease Father    Heart attack Father    Diabetes Sister    Breast cancer Maternal Aunt        <50   Breast cancer Paternal Aunt        x2.  <50   Healthy Son    Drug abuse Sister     Social History   Tobacco Use   Smoking status: Every Day    Packs/day: 0.50    Years: 37.00    Pack years: 18.50    Types: Cigarettes    Start date: 11/26/1979   Smokeless tobacco: Never  Substance Use Topics   Alcohol use: No    Alcohol/week: 0.0 standard drinks    Comment: but used to be a heavy drinker, quit after DUI 2021     Current Outpatient Medications:    albuterol (VENTOLIN HFA) 108 (90 Base) MCG/ACT inhaler, Inhale 2 puffs into the lungs every 6 (six) hours as needed for wheezing or shortness of breath., Disp: 8 g, Rfl: 0   amphetamine-dextroamphetamine (ADDERALL) 15 MG tablet, Take 15 mg by mouth daily in the afternoon., Disp: , Rfl:    amphetamine-dextroamphetamine (ADDERALL) 30 MG tablet, Take 30 mg by mouth 2 (two) times daily., Disp: , Rfl:    Aspirin-Caffeine (BC FAST PAIN RELIEF ARTHRITIS PO), Take 1 packet by mouth 2 (two) times daily as needed (pain)., Disp: , Rfl:    busPIRone (BUSPAR) 30 MG tablet, Take 30 mg by mouth 2 (two) times daily. , Disp: , Rfl:    Calcium Carb-Cholecalciferol (CALCIUM 600 + D PO), Take 1 tablet by mouth 2 (two) times daily., Disp: , Rfl:    clonazePAM (KLONOPIN) 1 MG tablet, Take 1-2 mg by mouth 6 (six) times daily., Disp: , Rfl:    cyclobenzaprine (FLEXERIL) 10 MG tablet, Take 10 mg by mouth 3 (three) times daily as needed for muscle spasms., Disp: , Rfl:    dexlansoprazole (DEXILANT) 60 MG capsule, Take 1 capsule (60 mg total) by mouth daily., Disp: 90 capsule, Rfl: 1   enoxaparin (LOVENOX) 40 MG/0.4ML injection, Inject 0.4 mLs (40 mg total)  into the skin daily for 14 days., Disp: 5.6 mL, Rfl: 0   fluticasone (FLONASE) 50 MCG/ACT nasal spray, Place 2 sprays into both nostrils daily., Disp: 16 g, Rfl: 6   gabapentin (NEURONTIN) 300 MG capsule, Take 300 mg by mouth daily., Disp: , Rfl:    LATUDA 80 MG TABS tablet, Take 80 mg by mouth daily., Disp: , Rfl:  naloxone (NARCAN) nasal spray 4 mg/0.1 mL, As directed for opioid induced respiratory depression, Disp: 1 each, Rfl: 1   Oxycodone HCl 10 MG TABS, Take 1 tablet (10 mg total) by mouth every 4 (four) hours as needed (pain score 7-10)., Disp: 150 tablet, Rfl: 0   Oxycodone HCl 10 MG TABS, Take 1 tablet (10 mg total) by mouth every 4 (four) hours., Disp: 150 tablet, Rfl: 0   Podiatric Products (GOLD BOND FOOT EX), Apply 1 application topically daily., Disp: , Rfl:    polyethylene glycol (MIRALAX / GLYCOLAX) 17 g packet, Take 17 g by mouth daily as needed for mild constipation., Disp: 14 each, Rfl: 0   sertraline (ZOLOFT) 100 MG tablet, Take 200 mg by mouth daily., Disp: , Rfl:    traZODone (DESYREL) 100 MG tablet, Take 400 mg by mouth at bedtime. , Disp: , Rfl:    umeclidinium-vilanterol (ANORO ELLIPTA) 62.5-25 MCG/INH AEPB, Inhale 1 puff into the lungs daily at 6 (six) AM. In place of Spiriva, Disp: 3 each, Rfl: 1   valsartan (DIOVAN) 80 MG tablet, Take 1 tablet (80 mg total) by mouth daily., Disp: 90 tablet, Rfl: 1  Allergies  Allergen Reactions   Codeine Nausea Only   Imitrex [Sumatriptan] Other (See Comments)    chest pain    I personally reviewed active problem list, medication list, allergies, family history, social history, health maintenance with the patient/caregiver today.   ROS  ***  Objective  There were no vitals filed for this visit.  There is no height or weight on file to calculate BMI.  Physical Exam ***  Recent Results (from the past 2160 hour(s))  SARS CORONAVIRUS 2 (TAT 6-24 HRS) Nasopharyngeal Nasopharyngeal Swab     Status: None   Collection  Time: 02/10/21  8:59 AM   Specimen: Nasopharyngeal Swab  Result Value Ref Range   SARS Coronavirus 2 NEGATIVE NEGATIVE    Comment: (NOTE) SARS-CoV-2 target nucleic acids are NOT DETECTED.  The SARS-CoV-2 RNA is generally detectable in upper and lower respiratory specimens during the acute phase of infection. Negative results do not preclude SARS-CoV-2 infection, do not rule out co-infections with other pathogens, and should not be used as the sole basis for treatment or other patient management decisions. Negative results must be combined with clinical observations, patient history, and epidemiological information. The expected result is Negative.  Fact Sheet for Patients: SugarRoll.be  Fact Sheet for Healthcare Providers: https://www.woods-mathews.com/  This test is not yet approved or cleared by the [place] FDA and  has been authorized for detection and/or diagnosis of SARS-CoV-2 by FDA under an Emergency Use Authorization (EUA). This EUA will remain  in effect (meaning this test can be used) for the duration of the COVID-19 declaration under Se ction 564(b)(1) of the Act, 21 U.S.C. section 360bbb-3(b)(1), unless the authorization is terminated or revoked sooner.  Performed at [facility], [place] [address], [place], [place] [address]   Surgical pcr screen     Status: None   Collection Time: 02/10/21  8:59 AM   Specimen: Nasal Mucosa; Nasal Swab  Result Value Ref Range   MRSA, PCR NEGATIVE NEGATIVE   Staphylococcus aureus NEGATIVE NEGATIVE    Comment: (NOTE) The Xpert SA Assay (FDA approved for NASAL specimens in patients 25 years of age and older), is one component of a comprehensive surveillance program. It is not intended to diagnose infection nor to guide or monitor treatment. Performed at [facility], [place],  [place], [place] [address]   Glucose, capillary     Status: None   Collection Time:  02/11/21  6:48 AM  Result Value Ref Range   Glucose-Capillary 75 70 - 99 mg/dL    Comment: Glucose reference range applies only to samples taken after fasting for at least 8 hours.  Aerobic/Anaerobic Culture w Gram Stain (surgical/deep wound)     Status: None   Collection Time: 02/11/21  8:15 AM   Specimen: PATH Other; Tissue  Result Value Ref Range   Specimen Description      KNEE RIGHT Performed at [facility], [address] [place], [place] [address]    Special Requests      NONE Performed at [facility], [place], [place] [address]    Gram Stain NO WBC SEEN NO ORGANISMS SEEN     Culture      No growth aerobically or anaerobically. Performed at [facility], [place] [address], [place], [place] [address]    Report Status 02/16/2021 FINAL   CBC     Status: None   Collection Time: 02/11/21 12:19 PM  Result Value Ref Range   WBC 7.3 4.0 - 10.5 K/uL   RBC 4.02 3.87 - 5.11 MIL/uL   Hemoglobin 12.1 12.0 - 15.0 g/dL   HCT 36.3 36.0 - 46.0 %   MCV 90.3 80.0 - 100.0 fL   MCH 30.1 26.0 - 34.0 pg   MCHC 33.3 30.0 - 36.0 g/dL   RDW 14.3 11.5 - 15.5 %   Platelets 188 150 - 400 K/uL   nRBC 0.0 0.0 - 0.2 %    Comment: Performed at [facility], [address] [place], [place] [address]  Creatinine, serum     Status: None   Collection Time: 02/11/21 12:19 PM  Result Value Ref Range   Creatinine, Ser 0.56 0.44 - 1.00 mg/dL   GFR, Estimated >60 >60 mL/min    Comment: (NOTE) Calculated using the CKD-EPI Creatinine Equation (2021) Performed at [facility], [address] [place], [place] [address]   CBC     Status: Abnormal   Collection Time: 02/12/21  5:52 AM  Result Value Ref Range   WBC 9.3 4.0 - 10.5 K/uL   RBC 3.80 (L) 3.87 - 5.11 MIL/uL   Hemoglobin 11.0 (L) 12.0 - 15.0 g/dL   HCT 33.6 (L) 36.0 - 46.0 %   MCV 88.4 80.0 - 100.0 fL   MCH 28.9 26.0 - 34.0 pg   MCHC 32.7 30.0 - 36.0 g/dL   RDW 14.2 11.5 - 15.5  %   Platelets 245 150 - 400 K/uL   nRBC 0.0 0.0 - 0.2 %    Comment: Performed at [facility], [address] [place], [place] [address]  Basic metabolic panel     Status: Abnormal   Collection Time: 02/12/21  5:52 AM  Result Value Ref Range   Sodium 138 135 - 145 mmol/L   Potassium 3.2 (L) 3.5 - 5.1 mmol/L   Chloride 101 98 - 111 mmol/L   CO2 30 22 - 32 mmol/L   Glucose, Bld 95 70 - 99 mg/dL    Comment: Glucose reference range applies only to samples taken after fasting for at least 8 hours.   BUN <5 (L) 6 - 20 mg/dL   Creatinine, Ser 0.53 0.44 - 1.00 mg/dL   Calcium 8.6 (L) 8.9 - 10.3 mg/dL   GFR, Estimated >60 >60 mL/min  Comment: (NOTE) Calculated using the CKD-EPI Creatinine Equation (2021)    Anion gap 7 5 - 15    Comment: Performed at [facility], [address] [place], [place] [address]    PHQ2/9: Depression screen [facility] 2/9 04/20/2021 12/14/2020 11/16/2020 10/26/2020 05/14/2020  Decreased Interest 0 0 0 0 0  Down, Depressed, Hopeless 0 0 0 0 0  PHQ - 2 Score 0 0 0 0 0  Altered sleeping 0 - 2 - -  Tired, decreased energy 0 - 1 - -  Change in appetite 0 - 0 - -  Feeling bad or failure about yourself  0 - 0 - -  Trouble concentrating 0 - 0 - -  Moving slowly or fidgety/restless 0 - 0 - -  Suicidal thoughts 0 - 0 - -  PHQ-9 Score 0 - 3 - -  Difficult doing work/chores Not difficult at all - Not difficult at all - -  Some recent data might be hidden    phq 9 is {gen pos [id number]}   Fall Risk: Fall Risk  04/20/2021 12/14/2020 11/16/2020 10/26/2020 07/13/2020  Falls in the past year? 0 0 0 0 0  Number falls in past yr: 0 - 0 - -  Injury with Fall? 0 - 0 - -  Comment - - - - -  Risk for fall due to : - - - - No Fall Risks  Follow up - - Falls evaluation completed - Falls evaluation completed  Comment - - - - -      Functional Status Survey:      Assessment & Plan  *** There are no diagnoses linked to this encounter.

## 2021-05-17 ENCOUNTER — Ambulatory Visit: Payer: Medicare Other | Admitting: Family Medicine

## 2021-05-18 NOTE — Progress Notes (Signed)
Name: Wendy Johnson   MRN: [id number]    DOB: 07-Jul-1965   Date:05/19/2021       Progress Note  Subjective  Chief Complaint  Follow up   HPI  GERD: she continues to have heart burn, had normal EGD back in the Spring of 2021, she still takes ibuprofen but only prn, she takes Dexilant as prescribed, it does not seem to work like it used to, she has regurgitation. She has been smoking more , at times 2 packs per day, explained she needs to cut down on nicotine and follow and GERD diet   HTN: she is taking Diovan 80 mg and denies chest pain or palpitation. She is compliant and denies side effects BP is at goal    History of DM : A1C has been normal for a while now and is off medication Denies polyphagia, polydipsia or polyuria. She still has low HDL, LDL elevated and triglycerides up. She has not been taking Atorvastatin and stopped Lovaza, reviewed last labs    Dyslipidemia:  She has high triglycerides , low HDl and high LDL, she has been off medications, we will recheck it today. She states cholesterol medication was given her headaches and Lovaza made reflux symptoms worse    DDD lumbar spine: she sees pain clinic - Dr Andree Elk, she is on Oxycodone 10 mg QID, denies constipation - controlled with Benifiber, she has narcan at home. She also has OA of knee and shoulders and sees Ortho at [facility] and has steroid injections . She had a revision of right total knee 10/22 , she continues to have daily pain, and needs ibuprofen to take prn    Morbid obesity: her maximum weight was around 273 lbs, she lost down to 184 lbs with physical activity and life style modification, she gradually gained weight back, went up to 250 lbs, last visit in July was 244 lbs and today is down to 238 lbs. She is cutting down on portion size and also going to the gym now  Bipolar disorder: on multiple medications , given by Dr. Kasandra Knudsen . Reviewed medications with her She states her mood is stable.    COPD : she states  that she could not tolerate Breo, she is on Anoro now and denies side effects. She wants to stop smoking, she asked for nicotine patch , she has been smoking half to two packs per day and has noticed increase in sob, she has morning cough and occasional wheezing. She would like to go back to see Dr. Humphrey Rolls    Alcohol history: she completely quit Feb 2017 but had a relapse, she has quit again since June 2020. Continue hard work   Migraine headaches: she did for a while, unable to tolerate Imitrex, she has noticed increase in headaches, multiple times a week, states woke this morning with throbbing sensation on frontal area, she gets nauseated, at times vomiting. Denies phonophobia but has phonophobia.  Patient Active Problem List   Diagnosis Date Noted   S/P revision of total knee, right 02/11/2021   Esophageal dysphagia    History of colonic polyps    Sciatica, right side 02/18/2019   Pain in joint, shoulder region 12/19/2018   Unspecified inflammatory spondylopathy, lumbar region ([place]) 10/04/2018   BMI 40.0-44.9, adult ([place]) 08/17/2018   Lumbar spondylosis 03/19/2018   Polyarthralgia 03/19/2018   History of alcoholism ([place]) 07/19/2017   Bilateral carpal tunnel syndrome 05/29/2017   Painful total knee replacement, right ([place]) 10/11/2016  Rotator cuff tendinitis, right 07/29/2016   Occipital neuralgia 10/12/2015   Instability of prosthetic knee ([place]) 09/22/2015   Primary osteoarthritis of right hip 05/12/2015   Sleep apnea 04/07/2015   Primary osteoarthritis of left hip 91/47/8295   Metabolic syndrome 62/13/0865   Migraine without aura and without status migrainosus, not intractable 11/26/2014   GERD without esophagitis 11/26/2014   COPD, moderate ([place]) 11/26/2014   Nocturnal oxygen desaturation 11/26/2014   Supplemental oxygen dependent 11/26/2014   Hearing loss 11/26/2014   Pain syndrome, chronic 11/26/2014   History of hypertension 11/26/2014   Dyslipidemia 11/26/2014   Morbid  obesity ([place]) 11/26/2014   Chronic constipation 11/26/2014   Generalized anxiety disorder 11/11/2014   DDD (degenerative disc disease), lumbar 11/11/2014   Facet arthritis of lumbar region 11/11/2014   Primary osteoarthritis involving multiple joints 11/11/2014   H/O hysterectomy for benign disease 10/20/2014   Low back derangement syndrome 09/30/2014   Bipolar disorder ([place]) 09/30/2014    Past Surgical History:  Procedure Laterality Date   ABDOMINAL HYSTERECTOMY N/A 10/20/2014   Procedure: Total abdominial hysterectomy, bilateral salpingo-oophorectomy;  Surgeon: Brayton Mars, MD;  Location: [facility] ORS;  Service: Gynecology;  Laterality: N/A;   BILATERAL SALPINGOOPHORECTOMY     bone spurs removed Bilateral    CARPAL TUNNEL RELEASE Left 06/08/2017   Procedure: CARPAL TUNNEL RELEASE;  Surgeon: Hessie Knows, MD;  Location: [facility];  Service: Orthopedics;  Laterality: Left;   CARPAL TUNNEL RELEASE Right 10/10/2017   Procedure: CARPAL TUNNEL RELEASE;  Surgeon: Hessie Knows, MD;  Location: [facility];  Service: Orthopedics;  Laterality: Right;   COLONOSCOPY WITH PROPOFOL N/A 09/01/2016   Procedure: COLONOSCOPY WITH PROPOFOL;  Surgeon: Jonathon Bellows, MD;  Location: [facility] ENDOSCOPY;  Service: Endoscopy;  Laterality: N/A;   COLONOSCOPY WITH PROPOFOL N/A 07/30/2019   Procedure: COLONOSCOPY WITH PROPOFOL;  Surgeon: Lucilla Lame, MD;  Location: [facility] ENDOSCOPY;  Service: Endoscopy;  Laterality: N/A;   DORSAL COMPARTMENT RELEASE Left 06/08/2017   Procedure: RELEASE DORSAL COMPARTMENT (DEQUERVAIN);  Surgeon: Hessie Knows, MD;  Location: [facility]S;  Service: Orthopedics;  Laterality: Left;   ESOPHAGOGASTRODUODENOSCOPY (EGD) WITH PROPOFOL N/A 09/01/2016   Procedure: ESOPHAGOGASTRODUODENOSCOPY (EGD) WITH PROPOFOL;  Surgeon: Jonathon Bellows, MD;  Location: [facility] ENDOSCOPY;  Service: Endoscopy;  Laterality: N/A;   ESOPHAGOGASTRODUODENOSCOPY (EGD) WITH PROPOFOL N/A 07/30/2019   Procedure: ESOPHAGOGASTRODUODENOSCOPY  (EGD) WITH PROPOFOL;  Surgeon: Lucilla Lame, MD;  Location: [facility] ENDOSCOPY;  Service: Endoscopy;  Laterality: N/A;   FOOT SURGERY Bilateral    INSERTION OF MESH N/A 12/26/2017   Procedure: INSERTION OF MESH;  Surgeon: Jules Husbands, MD;  Location: [facility]S;  Service: General;  Laterality: N/A;   JOINT REPLACEMENT Right    Total Knee replacement X 2   JOINT REPLACEMENT Bilateral    Total Hip Replacement   knee arthroscopo Right    LAPAROSCOPY  09/22/2014   Procedure: LAPAROSCOPY OPERATIVE;  Surgeon: Brayton Mars, MD;  Location: [facility];  Service: Gynecology;;  excision and fulgeration of endomertriosis   LIPOMA EXCISION     ROBOTIC ASSISTED LAPAROSCOPIC VENTRAL/INCISIONAL HERNIA REPAIR N/A 12/26/2017   Procedure: ROBOTIC ASSISTED LAPAROSCOPIC VENTRAL/INCISIONAL [place];  Surgeon: Jules Husbands, MD;  Location: [facility];  Service: General;  Laterality: N/A;   TONSILLECTOMY     TOTAL HIP ARTHROPLASTY Left 12/16/2014   Procedure: TOTAL HIP ARTHROPLASTY ANTERIOR APPROACH;  Surgeon: Hessie Knows, MD;  Location: [facility];  Service: Orthopedics;  Laterality: Left;   TOTAL HIP ARTHROPLASTY Right 05/12/2015   Procedure: TOTAL  HIP ARTHROPLASTY ANTERIOR APPROACH;  Surgeon: Hessie Knows, MD;  Location: [facility];  Service: Orthopedics;  Laterality: Right;   TOTAL KNEE REVISION Right 09/22/2015   Procedure: TOTAL KNEE REVISION/ REVISE POLYIETHYLENE;  Surgeon: Hessie Knows, MD;  Location: [facility];  Service: Orthopedics;  Laterality: Right;   TOTAL KNEE REVISION Right 10/11/2016   Procedure: TOTAL KNEE REVISION;  Surgeon: Hessie Knows, MD;  Location: [facility];  Service: Orthopedics;  Laterality: Right;   TOTAL KNEE REVISION Right 02/11/2021   Procedure: TOTAL KNEE REVISION;  Surgeon: Hessie Knows, MD;  Location: [facility];  Service: Orthopedics;  Laterality: Right;   TUBAL LIGATION     VENTRAL HERNIA REPAIR N/A 11/08/2018   Procedure: HERNIA REPAIR VENTRAL ADULT OPEN. DIABETIC, SLEEP APNEA;  Surgeon:  Jules Husbands, MD;  Location: [facility];  Service: General;  Laterality: N/A;    Family History  Problem Relation Age of Onset   Diabetes Mother    Heart disease Father    Heart attack Father    Diabetes Sister    Breast cancer Maternal Aunt        <50   Breast cancer Paternal Aunt        x2.  <50   Healthy Son    Drug abuse Sister     Social History   Tobacco Use   Smoking status: Every Day    Packs/day: 0.50    Years: 37.00    Pack years: 18.50    Types: Cigarettes    Start date: 11/26/1979   Smokeless tobacco: Never  Substance Use Topics   Alcohol use: No    Alcohol/week: 0.0 standard drinks    Comment: but used to be a heavy drinker, quit after DUI 2021     Current Outpatient Medications:    albuterol (VENTOLIN HFA) 108 (90 Base) MCG/ACT inhaler, Inhale 2 puffs into the lungs every 6 (six) hours as needed for wheezing or shortness of breath., Disp: 8 g, Rfl: 0   amphetamine-dextroamphetamine (ADDERALL) 15 MG tablet, Take 15 mg by mouth daily in the afternoon., Disp: , Rfl:    amphetamine-dextroamphetamine (ADDERALL) 30 MG tablet, Take 30 mg by mouth 2 (two) times daily., Disp: , Rfl:    Aspirin-Caffeine (BC FAST PAIN RELIEF ARTHRITIS PO), Take 1 packet by mouth 2 (two) times daily as needed (pain)., Disp: , Rfl:    busPIRone (BUSPAR) 30 MG tablet, Take 30 mg by mouth 2 (two) times daily. , Disp: , Rfl:    Calcium Carb-Cholecalciferol (CALCIUM 600 + D PO), Take 1 tablet by mouth 2 (two) times daily., Disp: , Rfl:    clindamycin (CLEOCIN) 300 MG capsule, Take 300 mg by mouth 2 (two) times daily., Disp: , Rfl:    clonazePAM (KLONOPIN) 1 MG tablet, Take 1-2 mg by mouth 6 (six) times daily., Disp: , Rfl:    cyclobenzaprine (FLEXERIL) 10 MG tablet, Take 10 mg by mouth 3 (three) times daily as needed for muscle spasms., Disp: , Rfl:    cyclobenzaprine (FLEXERIL) 10 MG tablet, Take 1 tablet by mouth 3 (three) times daily., Disp: , Rfl:    dexlansoprazole (DEXILANT) 60 MG  capsule, Take 1 capsule (60 mg total) by mouth daily., Disp: 90 capsule, Rfl: 1   HYDROcodone-acetaminophen (NORCO) 10-325 MG tablet, Take by mouth., Disp: , Rfl:    HYDROcodone-acetaminophen (NORCO) 7.5-325 MG tablet, Take 1 tablet by mouth 4 (four) times daily as needed., Disp: , Rfl:    hydrOXYzine (ATARAX) 50 MG tablet, Take by mouth.,  Disp: , Rfl:    indomethacin (INDOCIN) 25 MG capsule, Take 25 mg by mouth 3 (three) times daily., Disp: , Rfl:    LATUDA 80 MG TABS tablet, Take 80 mg by mouth daily., Disp: , Rfl:    naloxone (NARCAN) nasal spray 4 mg/0.1 mL, As directed for opioid induced respiratory depression, Disp: 1 each, Rfl: 1   omega-3 acid ethyl esters (LOVAZA) 1 g capsule, Take 2 capsules by mouth 2 (two) times daily., Disp: , Rfl:    Oxycodone HCl 10 MG TABS, Take 1 tablet (10 mg total) by mouth every 4 (four) hours., Disp: 150 tablet, Rfl: 0   Podiatric Products (GOLD BOND FOOT EX), Apply 1 application topically daily., Disp: , Rfl:    sertraline (ZOLOFT) 100 MG tablet, Take 200 mg by mouth daily., Disp: , Rfl:    traZODone (DESYREL) 100 MG tablet, Take 400 mg by mouth at bedtime. , Disp: , Rfl:    umeclidinium-vilanterol (ANORO ELLIPTA) 62.5-25 MCG/INH AEPB, Inhale 1 puff into the lungs daily at 6 (six) AM. In place of Spiriva, Disp: 3 each, Rfl: 1   valsartan (DIOVAN) 80 MG tablet, Take 1 tablet (80 mg total) by mouth daily., Disp: 90 tablet, Rfl: 1   gabapentin (NEURONTIN) 300 MG capsule, Take 300 mg by mouth daily. (Patient not taking: Reported on 05/19/2021), Disp: , Rfl:    polyethylene glycol (MIRALAX / GLYCOLAX) 17 g packet, Take 17 g by mouth daily as needed for mild constipation. (Patient not taking: Reported on 05/19/2021), Disp: 14 each, Rfl: 0  Allergies  Allergen Reactions   Codeine Nausea Only   Imitrex [Sumatriptan] Other (See Comments)    chest pain    I personally reviewed active problem list, medication list, allergies, family history, social history, health  maintenance with the patient/caregiver today.   ROS  Constitutional: Negative for fever, positive for mild  weight change.  Respiratory: Negative for cough and shortness of breath.   Cardiovascular: Negative for chest pain or palpitations.  Gastrointestinal: Negative for abdominal pain, no bowel changes.  Musculoskeletal: Negative for gait problem or joint swelling.  Skin: Negative for rash.  Neurological: Negative for dizziness or headache.  No other specific complaints in a complete review of systems (except as listed in HPI above).   Objective  Vitals:   05/19/21 0817  BP: 130/86  Pulse: 92  Resp: 16  Temp: 98.2 F (36.8 C)  SpO2: 97%  Weight: 238 lb (108 kg)  Height: 5\' 4"  (1.626 m)    Body mass index is 40.85 kg/m.  Physical Exam  Constitutional: Patient appears well-developed and well-nourished. Obese  No distress.  HEENT: head atraumatic, normocephalic, pupils equal and reactive to light, neck supple, throat within normal limits Cardiovascular: Normal rate, regular rhythm and normal heart sounds.  No murmur heard. No BLE edema. Muscular skeletal: right knee effusion, left knee genu valgum  Pulmonary/Chest: Effort normal and breath sounds normal. No respiratory distress. Abdominal: Soft.  There is no tenderness. Psychiatric: Patient has a normal mood and affect. behavior is normal. Judgment and thought content normal.   PHQ2/9: Depression screen [facility] 2/9 05/19/2021 04/20/2021 12/14/2020 11/16/2020 10/26/2020  Decreased Interest 1 0 0 0 0  Down, Depressed, Hopeless 1 0 0 0 0  PHQ - 2 Score 2 0 0 0 0  Altered sleeping 3 0 - 2 -  Tired, decreased energy 3 0 - 1 -  Change in appetite 2 0 - 0 -  Feeling bad or failure about yourself  1 0 - 0 -  Trouble concentrating 0 0 - 0 -  Moving slowly or fidgety/restless 0 0 - 0 -  Suicidal thoughts 0 0 - 0 -  PHQ-9 Score 11 0 - 3 -  Difficult doing work/chores - Not difficult at all - Not difficult at all -  Some recent data  might be hidden    phq 9 is positive   Fall Risk: Fall Risk  05/19/2021 04/20/2021 12/14/2020 11/16/2020 10/26/2020  Falls in the past year? 0 0 0 0 0  Number falls in past yr: 0 0 - 0 -  Injury with Fall? 0 0 - 0 -  Comment - - - - -  Risk for fall due to : No Fall Risks - - - -  Follow up Falls prevention discussed - - Falls evaluation completed -  Comment - - - - -      Functional Status Survey: Is the patient deaf or have difficulty hearing?: Yes Does the patient have difficulty seeing, even when wearing glasses/contacts?: Yes Does the patient have difficulty concentrating, remembering, or making decisions?: No Does the patient have difficulty walking or climbing stairs?: Yes Does the patient have difficulty dressing or bathing?: No Does the patient have difficulty doing errands alone such as visiting a doctor's office or shopping?: No    Assessment & Plan  1. COPD, moderate ([place])  - umeclidinium-vilanterol (ANORO ELLIPTA) 62.5-25 MCG/ACT AEPB; Inhale 1 puff into the lungs daily at 6 (six) AM.  Dispense: 3 each; Refill: 1 - Ambulatory referral to Pulmonology  2. Hypertension, benign  - valsartan (DIOVAN) 80 MG tablet; Take 1 tablet (80 mg total) by mouth daily.  Dispense: 90 tablet; Refill: 1  3. Primary osteoarthritis of left knee   4. Morbid obesity with BMI of 40.0-44.9, adult [facility])  Discussed with the patient the risk posed by an increased BMI. Discussed importance of portion control, calorie counting and at least 150 minutes of physical activity weekly. Avoid sweet beverages and drink more water. Eat at least 6 servings of fruit and vegetables daily    5. Bipolar affective disorder, currently depressed, mild (HCC)   6. Pain syndrome, chronic  - ibuprofen (ADVIL) 800 MG tablet; Take 1 tablet (800 mg total) by mouth every 8 (eight) hours as needed.  Dispense: 90 tablet; Refill: 0  7. Dyslipidemia   8. History of diabetes mellitus, type II   9. History of  alcoholism ([place])   10. Migraine without aura and with status migrainosus, not intractable  - Rimegepant Sulfate (NURTEC) 75 MG TBDP; Take 1 tablet by mouth every other day.  Dispense: 16 tablet; Refill: 5  11. Gastroesophageal reflux disease without esophagitis  - dexlansoprazole (DEXILANT) 60 MG capsule; Take 1 capsule (60 mg total) by mouth daily.  Dispense: 90 capsule; Refill: 1  12. Tobacco use  - nicotine (NICODERM CQ - DOSED IN MG/24 HOURS) 21 mg/24hr patch; Place 1 patch (21 mg total) onto the skin daily.  Dispense: 28 patch; Refill: 0 - Ambulatory referral to Pulmonology  13. Needs flu shot  - Flu Vaccine QUAD 6+ mos PF IM (Fluarix Quad PF)

## 2021-05-19 ENCOUNTER — Encounter: Payer: Self-pay | Admitting: Family Medicine

## 2021-05-19 ENCOUNTER — Ambulatory Visit (INDEPENDENT_AMBULATORY_CARE_PROVIDER_SITE_OTHER): Payer: Medicare Other | Admitting: Family Medicine

## 2021-05-19 VITALS — BP 130/86 | HR 92 | Temp 98.2°F | Resp 16 | Ht 64.0 in | Wt 238.0 lb

## 2021-05-19 DIAGNOSIS — Z720 Tobacco use: Secondary | ICD-10-CM

## 2021-05-19 DIAGNOSIS — E785 Hyperlipidemia, unspecified: Secondary | ICD-10-CM

## 2021-05-19 DIAGNOSIS — I10 Essential (primary) hypertension: Secondary | ICD-10-CM | POA: Diagnosis not present

## 2021-05-19 DIAGNOSIS — M1712 Unilateral primary osteoarthritis, left knee: Secondary | ICD-10-CM

## 2021-05-19 DIAGNOSIS — Z23 Encounter for immunization: Secondary | ICD-10-CM | POA: Diagnosis not present

## 2021-05-19 DIAGNOSIS — F3131 Bipolar disorder, current episode depressed, mild: Secondary | ICD-10-CM

## 2021-05-19 DIAGNOSIS — Z6841 Body Mass Index (BMI) 40.0 and over, adult: Secondary | ICD-10-CM

## 2021-05-19 DIAGNOSIS — K219 Gastro-esophageal reflux disease without esophagitis: Secondary | ICD-10-CM

## 2021-05-19 DIAGNOSIS — G894 Chronic pain syndrome: Secondary | ICD-10-CM

## 2021-05-19 DIAGNOSIS — J449 Chronic obstructive pulmonary disease, unspecified: Secondary | ICD-10-CM

## 2021-05-19 DIAGNOSIS — G43001 Migraine without aura, not intractable, with status migrainosus: Secondary | ICD-10-CM

## 2021-05-19 DIAGNOSIS — Z8639 Personal history of other endocrine, nutritional and metabolic disease: Secondary | ICD-10-CM

## 2021-05-19 DIAGNOSIS — F1021 Alcohol dependence, in remission: Secondary | ICD-10-CM

## 2021-05-19 MED ORDER — NURTEC 75 MG PO TBDP: 1.0000 | ORAL_TABLET | ORAL | 5 refills | Status: DC

## 2021-05-19 MED ORDER — NICOTINE 21 MG/24HR TD PT24: 21.0000 mg | MEDICATED_PATCH | Freq: Every day | TRANSDERMAL | 0 refills | Status: DC

## 2021-05-19 MED ORDER — IBUPROFEN 800 MG PO TABS: 800.0000 mg | ORAL_TABLET | Freq: Three times a day (TID) | ORAL | 0 refills | Status: DC | PRN

## 2021-05-19 MED ORDER — UMECLIDINIUM-VILANTEROL 62.5-25 MCG/ACT IN AEPB: 1.0000 | INHALATION_SPRAY | Freq: Every day | RESPIRATORY_TRACT | 1 refills | Status: DC

## 2021-05-19 MED ORDER — VALSARTAN 80 MG PO TABS: 80.0000 mg | ORAL_TABLET | Freq: Every day | ORAL | 1 refills | Status: DC

## 2021-05-19 MED ORDER — DEXLANSOPRAZOLE 60 MG PO CPDR: 1.0000 | DELAYED_RELEASE_CAPSULE | Freq: Every day | ORAL | 1 refills | Status: DC

## 2021-05-20 ENCOUNTER — Other Ambulatory Visit: Payer: Self-pay | Admitting: Internal Medicine

## 2021-05-20 DIAGNOSIS — J069 Acute upper respiratory infection, unspecified: Secondary | ICD-10-CM

## 2021-05-20 DIAGNOSIS — J441 Chronic obstructive pulmonary disease with (acute) exacerbation: Secondary | ICD-10-CM

## 2021-05-20 NOTE — Telephone Encounter (Signed)
Requested Prescriptions  Pending Prescriptions Disp Refills   albuterol (VENTOLIN HFA) 108 (90 Base) MCG/ACT inhaler [Pharmacy Med Name: ALBUTEROL SULFATE HFA 108 (90 BASE)] 8.5 g 0    Sig: INHALE 2 PUFFS INTO THE LUNGS EVERY 6 HOURS AS NEEDED FOR WHEEZING OR SHORTNESS OF BREATH     Pulmonology:  Beta Agonists Failed - 05/20/2021 12:01 PM      Failed - One inhaler should last at least one month. If the patient is requesting refills earlier, contact the patient to check for uncontrolled symptoms.      Passed - Valid encounter within last 12 months    Recent Outpatient Visits          Yesterday COPD, moderate [facility])   [facility] Steele Sizer, MD   1 month ago COPD exacerbation [facility])   [facility] Teodora Medici, DO   6 months ago RUQ pain   [facility] Steele Sizer, MD   1 year ago History of diabetes mellitus, type II   [facility] Steele Sizer, MD   1 year ago No-show for appointment   [facility] Delsa Grana, [place]

## 2021-05-25 ENCOUNTER — Other Ambulatory Visit: Payer: Self-pay

## 2021-05-25 ENCOUNTER — Ambulatory Visit: Payer: Medicare Other | Attending: Anesthesiology | Admitting: Anesthesiology

## 2021-05-25 DIAGNOSIS — M5136 Other intervertebral disc degeneration, lumbar region: Secondary | ICD-10-CM

## 2021-05-25 DIAGNOSIS — G894 Chronic pain syndrome: Secondary | ICD-10-CM | POA: Diagnosis not present

## 2021-05-25 DIAGNOSIS — M25552 Pain in left hip: Secondary | ICD-10-CM

## 2021-05-25 DIAGNOSIS — M5431 Sciatica, right side: Secondary | ICD-10-CM | POA: Diagnosis not present

## 2021-05-25 DIAGNOSIS — M545 Low back pain, unspecified: Secondary | ICD-10-CM

## 2021-05-25 DIAGNOSIS — M25561 Pain in right knee: Secondary | ICD-10-CM

## 2021-05-25 DIAGNOSIS — M47816 Spondylosis without myelopathy or radiculopathy, lumbar region: Secondary | ICD-10-CM

## 2021-05-25 DIAGNOSIS — M25551 Pain in right hip: Secondary | ICD-10-CM

## 2021-05-25 DIAGNOSIS — F1190 Opioid use, unspecified, uncomplicated: Secondary | ICD-10-CM

## 2021-05-25 MED ORDER — OXYCODONE HCL 10 MG PO TABS: 10.0000 mg | ORAL_TABLET | ORAL | 0 refills | Status: DC

## 2021-05-25 MED ORDER — OXYCODONE HCL 10 MG PO TABS: 10.0000 mg | ORAL_TABLET | ORAL | 0 refills | Status: AC

## 2021-05-25 NOTE — Progress Notes (Signed)
Virtual Visit via Telephone Note  I connected with Wendy Johnson on 05/25/21 at  4:30 PM EST by telephone and verified that I am speaking with the correct person using two identifiers.  Location: Patient: Home Provider: Pain control center   I discussed the limitations, risks, security and privacy concerns of performing an evaluation and management service by telephone and the availability of in person appointments. I also discussed with the patient that there may be a patient responsible charge related to this service. The patient expressed understanding and agreed to proceed.   History of Present Illness: I spoke with Wendy Johnson via telephone as we were unable to link for the video portion of the conference.  She states that that her back is been relatively stable.  She still gets some lower extremity sciatica symptoms but these have been decently controlled recently.  When she is more active they are more intense but she has been limited in activity secondary to her knee pain.  She seen Dr. Rudene Christians recently for this and still has some swelling in the right knee.  Especially behind the kneecap she reports is the worst pain.  It seems to be worse in the evening.  Her low back has been stable with no change in the quality characteristic or distribution of that.  She is presently taking oxycodone 10 mg tablets about every 4 hours for total of 5 tablets/day.  This gives her good relief about the back and chronic knee pain and is without side effects.  She has failed more conservative therapy.  Otherwise she is in her usual state of health now.  No side effects reported with her medicines and she is trying to work on some weight loss but is limited because of the inactivity.  Review of systems: General: No fevers or chills Pulmonary: No shortness of breath or dyspnea Cardiac: No angina or palpitations or lightheadedness GI: No abdominal pain or constipation Psych: No depression     Observations/Objective: Past Medical History:  Diagnosis Date   ADHD (attention deficit hyperactivity disorder)    Anxiety    AR (allergic rhinitis)    Arthritis    Benign hypertension    NO MEDS   Bipolar disorder (HCC)    Chronic back pain    Chronic constipation    Chronic insomnia    COPD (chronic obstructive pulmonary disease) (HCC)    Deaf    RIGHT EAR   Decreased dorsalis pedis pulse    Depression    Dyslipidemia    Fatty liver    GERD (gastroesophageal reflux disease)    Hepatomegaly    High cholesterol    Hot flashes    Migraine with aura    OCD (obsessive compulsive disorder)    Pain in joint, shoulder region 12/19/2018   Plantar warts    Sciatica, right side 02/18/2019   Severe obesity (HCC)    Shortness of breath dyspnea    Sleep apnea    NO CPAP   Tobacco use    Trochanteric bursitis of right hip      Current Outpatient Medications:    [START ON 07/06/2021] Oxycodone HCl 10 MG TABS, Take 1 tablet (10 mg total) by mouth every 4 (four) hours., Disp: 150 tablet, Rfl: 0   albuterol (VENTOLIN HFA) 108 (90 Base) MCG/ACT inhaler, INHALE 2 PUFFS INTO THE LUNGS EVERY 6 HOURS AS NEEDED FOR WHEEZING OR SHORTNESS OF BREATH, Disp: 8.5 g, Rfl: 0   amphetamine-dextroamphetamine (ADDERALL) 15 MG tablet, Take  15 mg by mouth daily in the afternoon., Disp: , Rfl:    amphetamine-dextroamphetamine (ADDERALL) 30 MG tablet, Take 30 mg by mouth 2 (two) times daily., Disp: , Rfl:    Aspirin-Caffeine (BC FAST PAIN RELIEF ARTHRITIS PO), Take 1 packet by mouth 2 (two) times daily as needed (pain)., Disp: , Rfl:    busPIRone (BUSPAR) 30 MG tablet, Take 30 mg by mouth 2 (two) times daily. , Disp: , Rfl:    Calcium Carb-Cholecalciferol (CALCIUM 600 + D PO), Take 1 tablet by mouth 2 (two) times daily., Disp: , Rfl:    clonazePAM (KLONOPIN) 1 MG tablet, Take 1-2 mg by mouth 6 (six) times daily., Disp: , Rfl:    cyclobenzaprine (FLEXERIL) 10 MG tablet, Take 10 mg by mouth 3 (three) times  daily as needed for muscle spasms., Disp: , Rfl:    dexlansoprazole (DEXILANT) 60 MG capsule, Take 1 capsule (60 mg total) by mouth daily., Disp: 90 capsule, Rfl: 1   hydrOXYzine (ATARAX) 50 MG tablet, Take by mouth., Disp: , Rfl:    ibuprofen (ADVIL) 800 MG tablet, Take 1 tablet (800 mg total) by mouth every 8 (eight) hours as needed., Disp: 90 tablet, Rfl: 0   LATUDA 80 MG TABS tablet, Take 80 mg by mouth daily., Disp: , Rfl:    naloxone (NARCAN) nasal spray 4 mg/0.1 mL, As directed for opioid induced respiratory depression, Disp: 1 each, Rfl: 1   nicotine (NICODERM CQ - DOSED IN MG/24 HOURS) 21 mg/24hr patch, Place 1 patch (21 mg total) onto the skin daily., Disp: 28 patch, Rfl: 0   [START ON 06/06/2021] Oxycodone HCl 10 MG TABS, Take 1 tablet (10 mg total) by mouth every 4 (four) hours., Disp: 150 tablet, Rfl: 0   Podiatric Products (GOLD BOND FOOT EX), Apply 1 application topically daily., Disp: , Rfl:    Rimegepant Sulfate (NURTEC) 75 MG TBDP, Take 1 tablet by mouth every other day., Disp: 16 tablet, Rfl: 5   sertraline (ZOLOFT) 100 MG tablet, Take 200 mg by mouth daily., Disp: , Rfl:    traZODone (DESYREL) 100 MG tablet, Take 400 mg by mouth at bedtime. , Disp: , Rfl:    umeclidinium-vilanterol (ANORO ELLIPTA) 62.5-25 MCG/ACT AEPB, Inhale 1 puff into the lungs daily at 6 (six) AM., Disp: 3 each, Rfl: 1   valsartan (DIOVAN) 80 MG tablet, Take 1 tablet (80 mg total) by mouth daily., Disp: 90 tablet, Rfl: 1  Assessment and Plan: 1. Chronic pain syndrome   2. DDD (degenerative disc disease), lumbar   3. Low back pain at multiple sites   4. Sciatica, right side   5. Pain of both hip joints   6. Chronic, continuous use of opioids   7. Facet arthritis of lumbar region   8. Acute pain of right knee   Based on her discussion today that is appropriate refill her medicines and I have reviewed the [facility] practitioner database information and is appropriate.  She is doing well with the  current clinical regimen.  She is taking some benzodiazepines but reports that she tries to avoid concomitant use while she is taking her opioid medications.  She does not have any side effects with the combination and no excess sedation is reported.  She gets good pain relief with the present medication regimen and no changes will be initiated.  Refills will be given for January 20 17 June 2026.  We will schedule her for 64-month return.  At that point we will  likely set up a April epidural.  I encouraged her to continue follow-up with Dr. Rudene Christians.  I also encouraged her to continue apply ice application at 6 PM and use an Ace bandage to help with the inflammation.  She is to continue follow-up with her primary care physicians for baseline medical care.  Follow Up Instructions:    I discussed the assessment and treatment plan with the patient. The patient was provided an opportunity to ask questions and all were answered. The patient agreed with the plan and demonstrated an understanding of the instructions.   The patient was advised to call back or seek an in-person evaluation if the symptoms worsen or if the condition fails to improve as anticipated.  I provided 30 minutes of non-face-to-face time during this encounter.   Molli Barrows, MD

## 2021-05-31 ENCOUNTER — Telehealth: Payer: Self-pay | Admitting: Anesthesiology

## 2021-05-31 NOTE — Telephone Encounter (Signed)
Called pharm and gave them approval to fill one day early. Called patient and informed her that they will filled medication on Saturday , but will need it to last the thiry days. Patient with understanding.

## 2021-06-14 ENCOUNTER — Ambulatory Visit: Payer: Medicare Other | Admitting: Internal Medicine

## 2021-06-17 ENCOUNTER — Other Ambulatory Visit: Payer: Self-pay

## 2021-06-17 ENCOUNTER — Telehealth: Payer: Self-pay

## 2021-06-17 ENCOUNTER — Ambulatory Visit (INDEPENDENT_AMBULATORY_CARE_PROVIDER_SITE_OTHER): Payer: Medicare Other | Admitting: Internal Medicine

## 2021-06-17 ENCOUNTER — Encounter: Payer: Self-pay | Admitting: Internal Medicine

## 2021-06-17 VITALS — BP 178/94 | HR 65 | Temp 98.2°F | Resp 16 | Ht 64.0 in | Wt 244.6 lb

## 2021-06-17 DIAGNOSIS — R0601 Orthopnea: Secondary | ICD-10-CM

## 2021-06-17 DIAGNOSIS — F17208 Nicotine dependence, unspecified, with other nicotine-induced disorders: Secondary | ICD-10-CM | POA: Diagnosis not present

## 2021-06-17 DIAGNOSIS — G4733 Obstructive sleep apnea (adult) (pediatric): Secondary | ICD-10-CM

## 2021-06-17 DIAGNOSIS — R053 Chronic cough: Secondary | ICD-10-CM

## 2021-06-17 DIAGNOSIS — R0789 Other chest pain: Secondary | ICD-10-CM

## 2021-06-17 DIAGNOSIS — R0602 Shortness of breath: Secondary | ICD-10-CM

## 2021-06-17 MED ORDER — IPRATROPIUM-ALBUTEROL 0.5-2.5 (3) MG/3ML IN SOLN
3.0000 mL | Freq: Four times a day (QID) | RESPIRATORY_TRACT | Status: DC | PRN
  Administered 2021-06-17: 3 mL via RESPIRATORY_TRACT

## 2021-06-17 NOTE — Telephone Encounter (Signed)
SS ordered. Printed. Gave to [employer]

## 2021-06-17 NOTE — Patient Instructions (Signed)

## 2021-06-17 NOTE — Progress Notes (Signed)
[facility] [place], [place] [address]  Pulmonary Sleep Medicine   Office Visit Note  Patient Name: Wendy Johnson [facility] DOB: 12/22/1965 MRN [id number]  Date of Service: 06/17/2021  Complaints/HPI: SOB COUGH CONGESTION SMOKER. She has a long history of COPD and has been a smoker for a long time. She smokes about a pack a day. She states she is on anora and has been using it. In addition she uses albuterol. She states her chest also hurts along with her lungs. Notes that she has difficulty sleeping laying flat. She also has a history of OSA but states she is not on CPAP because it broke and she never went back for follow up. She had also been on oxygen in the past.  ROS  General: (-) fever, (-) chills, (-) night sweats, (-) weakness Skin: (-) rashes, (-) itching,. Eyes: (-) visual changes, (-) redness, (-) itching. Nose and Sinuses: (-) nasal stuffiness or itchiness, (-) postnasal drip, (-) nosebleeds, (-) sinus trouble. Mouth and Throat: (-) sore throat, (-) hoarseness. Neck: (-) swollen glands, (-) enlarged thyroid, (-) neck pain. Respiratory: + cough, (-) bloody sputum, + shortness of breath, + wheezing. Cardiovascular: - ankle swelling, (-) chest pain. Lymphatic: (-) lymph node enlargement. Neurologic: (-) numbness, (-) tingling. Psychiatric: (-) anxiety, (-) depression   Current Medication: Outpatient Encounter Medications as of 06/17/2021  Medication Sig Note   albuterol (VENTOLIN HFA) 108 (90 Base) MCG/ACT inhaler INHALE 2 PUFFS INTO THE LUNGS EVERY 6 HOURS AS NEEDED FOR WHEEZING OR SHORTNESS OF BREATH    amphetamine-dextroamphetamine (ADDERALL) 15 MG tablet Take 15 mg by mouth daily in the afternoon. 01/29/2021: Takes 30 mg in the morning, 15 mg in the afternoon, then 30 mg in the evening   amphetamine-dextroamphetamine (ADDERALL) 30 MG tablet Take 30 mg by mouth 2 (two) times daily. 01/29/2021: Takes 30 mg in the morning, 15 mg in the afternoon, then 30 mg in  the evening   Aspirin-Caffeine (BC FAST PAIN RELIEF ARTHRITIS PO) Take 1 packet by mouth 2 (two) times daily as needed (pain).    busPIRone (BUSPAR) 30 MG tablet Take 30 mg by mouth 2 (two) times daily.     Calcium Carb-Cholecalciferol (CALCIUM 600 + D PO) Take 1 tablet by mouth 2 (two) times daily.    clonazePAM (KLONOPIN) 1 MG tablet Take 1-2 mg by mouth 6 (six) times daily.    cyclobenzaprine (FLEXERIL) 10 MG tablet Take 10 mg by mouth 3 (three) times daily as needed for muscle spasms.    dexlansoprazole (DEXILANT) 60 MG capsule Take 1 capsule (60 mg total) by mouth daily.    hydrOXYzine (ATARAX) 50 MG tablet Take by mouth.    ibuprofen (ADVIL) 800 MG tablet Take 1 tablet (800 mg total) by mouth every 8 (eight) hours as needed.    LATUDA 80 MG TABS tablet Take 80 mg by mouth daily.    naloxone (NARCAN) nasal spray 4 mg/0.1 mL As directed for opioid induced respiratory depression    nicotine (NICODERM CQ - DOSED IN MG/24 HOURS) 21 mg/24hr patch Place 1 patch (21 mg total) onto the skin daily.    Oxycodone HCl 10 MG TABS Take 1 tablet (10 mg total) by mouth every 4 (four) hours.    [START ON 07/06/2021] Oxycodone HCl 10 MG TABS Take 1 tablet (10 mg total) by mouth every 4 (four) hours.    Podiatric Products (GOLD BOND FOOT EX) Apply 1 application topically daily.    Rimegepant Sulfate (  NURTEC) 75 MG TBDP Take 1 tablet by mouth every other day.    sertraline (ZOLOFT) 100 MG tablet Take 200 mg by mouth daily.    traZODone (DESYREL) 100 MG tablet Take 400 mg by mouth at bedtime.     umeclidinium-vilanterol (ANORO ELLIPTA) 62.5-25 MCG/ACT AEPB Inhale 1 puff into the lungs daily at 6 (six) AM.    valsartan (DIOVAN) 80 MG tablet Take 1 tablet (80 mg total) by mouth daily.    No facility-administered encounter medications on file as of 06/17/2021.    Surgical History: Past Surgical History:  Procedure Laterality Date   ABDOMINAL HYSTERECTOMY N/A 10/20/2014   Procedure: Total abdominial  hysterectomy, bilateral salpingo-oophorectomy;  Surgeon: Brayton Mars, MD;  Location: [facility] ORS;  Service: Gynecology;  Laterality: N/A;   BILATERAL SALPINGOOPHORECTOMY     bone spurs removed Bilateral    CARPAL TUNNEL RELEASE Left 06/08/2017   Procedure: CARPAL TUNNEL RELEASE;  Surgeon: Hessie Knows, MD;  Location: [facility] ORS;  Service: Orthopedics;  Laterality: Left;   CARPAL TUNNEL RELEASE Right 10/10/2017   Procedure: CARPAL TUNNEL RELEASE;  Surgeon: Hessie Knows, MD;  Location: [facility] ORS;  Service: Orthopedics;  Laterality: Right;   COLONOSCOPY WITH PROPOFOL N/A 09/01/2016   Procedure: COLONOSCOPY WITH PROPOFOL;  Surgeon: Jonathon Bellows, MD;  Location: [facility] ENDOSCOPY;  Service: Endoscopy;  Laterality: N/A;   COLONOSCOPY WITH PROPOFOL N/A 07/30/2019   Procedure: COLONOSCOPY WITH PROPOFOL;  Surgeon: Lucilla Lame, MD;  Location: [facility] ENDOSCOPY;  Service: Endoscopy;  Laterality: N/A;   DORSAL COMPARTMENT RELEASE Left 06/08/2017   Procedure: RELEASE DORSAL COMPARTMENT (DEQUERVAIN);  Surgeon: Hessie Knows, MD;  Location: [facility] ORS;  Service: Orthopedics;  Laterality: Left;   ESOPHAGOGASTRODUODENOSCOPY (EGD) WITH PROPOFOL N/A 09/01/2016   Procedure: ESOPHAGOGASTRODUODENOSCOPY (EGD) WITH PROPOFOL;  Surgeon: Jonathon Bellows, MD;  Location: [facility] ENDOSCOPY;  Service: Endoscopy;  Laterality: N/A;   ESOPHAGOGASTRODUODENOSCOPY (EGD) WITH PROPOFOL N/A 07/30/2019   Procedure: ESOPHAGOGASTRODUODENOSCOPY (EGD) WITH PROPOFOL;  Surgeon: Lucilla Lame, MD;  Location[facility] ENDOSCOPY;  Service: Endoscopy;  Laterality: N/A;   FOOT SURGERY Bilateral    INSERTION OF MESH N/A 12/26/2017   Procedure: INSERTION OF MESH;  Surgeon: Jules Husbands, MD;  Location: [facility] ORS;  Service: General;  Laterality: N/A;   JOINT REPLACEMENT Right    Total Knee replacement X 2   JOINT REPLACEMENT Bilateral    Total Hip Replacement   knee arthroscopo Right    LAPAROSCOPY  09/22/2014   Procedure: LAPAROSCOPY OPERATIVE;  Surgeon: Brayton Mars, MD;  Location: [facility] ORS;  Service: Gynecology;;  excision and fulgeration of endomertriosis   LIPOMA EXCISION     ROBOTIC ASSISTED LAPAROSCOPIC VENTRAL/INCISIONAL HERNIA REPAIR N/A 12/26/2017   Procedure: ROBOTIC ASSISTED LAPAROSCOPIC VENTRAL/INCISIONAL [place];  Surgeon: Jules Husbands, MD;  Location: [facility] ORS;  Service: General;  Laterality: N/A;   TONSILLECTOMY     TOTAL HIP ARTHROPLASTY Left 12/16/2014   Procedure: TOTAL HIP ARTHROPLASTY ANTERIOR APPROACH;  Surgeon: Hessie Knows, MD;  Location: [facility] ORS;  Service: Orthopedics;  Laterality: Left;   TOTAL HIP ARTHROPLASTY Right 05/12/2015   Procedure: TOTAL HIP ARTHROPLASTY ANTERIOR APPROACH;  Surgeon: Hessie Knows, MD;  Location: [facility] ORS;  Service: Orthopedics;  Laterality: Right;   TOTAL KNEE REVISION Right 09/22/2015   Procedure: TOTAL KNEE REVISION/ REVISE POLYIETHYLENE;  Surgeon: Hessie Knows, MD;  Location: [facility] ORS;  Service: Orthopedics;  Laterality: Right;   TOTAL KNEE REVISION Right 10/11/2016   Procedure: TOTAL KNEE REVISION;  Surgeon: Hessie Knows, MD;  Location: [facility]  ORS;  Service: Orthopedics;  Laterality: Right;   TOTAL KNEE REVISION Right 02/11/2021   Procedure: TOTAL KNEE REVISION;  Surgeon: Hessie Knows, MD;  Location: [facility] ORS;  Service: Orthopedics;  Laterality: Right;   TUBAL LIGATION     VENTRAL HERNIA REPAIR N/A 11/08/2018   Procedure: HERNIA REPAIR VENTRAL ADULT OPEN. DIABETIC, SLEEP APNEA;  Surgeon: Jules Husbands, MD;  Location: [facility] ORS;  Service: General;  Laterality: N/A;    Medical History: Past Medical History:  Diagnosis Date   ADHD (attention deficit hyperactivity disorder)    Anxiety    AR (allergic rhinitis)    Arthritis    Benign hypertension    NO MEDS   Bipolar disorder (HCC)    Chronic back pain    Chronic constipation    Chronic insomnia    COPD (chronic obstructive pulmonary disease) (HCC)    Deaf    RIGHT EAR   Decreased dorsalis pedis pulse    Depression    Dyslipidemia     Fatty liver    GERD (gastroesophageal reflux disease)    Hepatomegaly    High cholesterol    Hot flashes    Migraine with aura    OCD (obsessive compulsive disorder)    Pain in joint, shoulder region 12/19/2018   Plantar warts    Sciatica, right side 02/18/2019   Severe obesity (HCC)    Shortness of breath dyspnea    Sleep apnea    NO CPAP   Tobacco use    Trochanteric bursitis of right hip     Family History: Family History  Problem Relation Age of Onset   Diabetes Mother    Heart disease Father    Heart attack Father    Diabetes Sister    Breast cancer Maternal Aunt        <50   Breast cancer Paternal Aunt        x2.  <50   Healthy Son    Drug abuse Sister     Social History: Social History   Socioeconomic History   Marital status: Divorced    Spouse name: Not on file   Number of children: 1   Years of education: Not on file   Highest education level: 10th grade  Occupational History   Occupation: disabled  Tobacco Use   Smoking status: Every Day    Packs/day: 0.50    Years: 37.00    Pack years: 18.50    Types: Cigarettes    Start date: 11/26/1979   Smokeless tobacco: Never  Vaping Use   Vaping Use: Never used  Substance and Sexual Activity   Alcohol use: No    Alcohol/week: 0.0 standard drinks    Comment: but used to be a heavy drinker, quit after DUI 2021   Drug use: No    Comment: quit crack cocaine 2004   Sexual activity: Yes    Partners: Male    Birth control/protection: None, Surgical  Other Topics Concern   Not on file  Social History Narrative   Lives with boyfriend   Disable from psychiatric disease   Social Determinants of Health   Financial Resource Strain: Not on file  Food Insecurity: Not on file  Transportation Needs: Not on file  Physical Activity: Not on file  Stress: Not on file  Social Connections: Not on file  Intimate Partner Violence: Not on file    Vital Signs: Blood pressure (!) 178/94, pulse 65, temperature 98.2  F (36.8 C), resp. rate 16, height  5\' 4"  (1.626 m), weight 244 lb 9.6 oz (110.9 kg), last menstrual period 09/26/2014, SpO2 99 %.  Examination: General Appearance: The patient is well-developed, well-nourished, and in no distress. Skin: Gross inspection of skin unremarkable. Head: normocephalic, no gross deformities. Eyes: no gross deformities noted. ENT: ears appear grossly normal no exudates. Neck: Supple. No thyromegaly. No LAD. Respiratory: few rhonchi noted. Cardiovascular: Normal S1 and S2 without murmur or rub. Extremities: No cyanosis. pulses are equal. Neurologic: Alert and oriented. No involuntary movements.  LABS: No results found for this or any previous visit (from the past 2160 hour(s)).  Radiology: DG Knee 1-2 Views Right  Result Date: 02/11/2021 CLINICAL DATA:  Postop total right knee replacement EXAM: RIGHT KNEE - 1-2 VIEW COMPARISON:  Radiograph 12/04/2020 FINDINGS: Postsurgical changes of constrained right total knee arthroplasty revision with antibiotic bead placement. Intact hardware without evidence of loosening or periprosthetic fracture. Expected soft tissue changes. IMPRESSION: Postsurgical changes of constrained right total knee arthroplasty revision with antibiotic bead placement. No evidence of immediate hardware complication. Electronically Signed   By: Maurine Simmering M.D.   On: 02/11/2021 12:18    No results found.  No results found.    Assessment and Plan: Patient Active Problem List   Diagnosis Date Noted   S/P revision of total knee, right 02/11/2021   Esophageal dysphagia    History of colonic polyps    Sciatica, right side 02/18/2019   Pain in joint, shoulder region 12/19/2018   Unspecified inflammatory spondylopathy, lumbar region ([place]) 10/04/2018   BMI 40.0-44.9, adult ([place]) 08/17/2018   Lumbar spondylosis 03/19/2018   Polyarthralgia 03/19/2018   History of alcoholism ([place]) 07/19/2017   Bilateral carpal tunnel syndrome 05/29/2017   Painful  total knee replacement, right ([place]) 10/11/2016   Rotator cuff tendinitis, right 07/29/2016   Occipital neuralgia 10/12/2015   Instability of prosthetic knee ([place]) 09/22/2015   Primary osteoarthritis of right hip 05/12/2015   Sleep apnea 04/07/2015   Primary osteoarthritis of left hip 45/07/8880   Metabolic syndrome 80/07/4915   Migraine without aura and without status migrainosus, not intractable 11/26/2014   GERD without esophagitis 11/26/2014   COPD, moderate ([place]) 11/26/2014   Nocturnal oxygen desaturation 11/26/2014   Supplemental oxygen dependent 11/26/2014   Hearing loss 11/26/2014   Pain syndrome, chronic 11/26/2014   History of hypertension 11/26/2014   Dyslipidemia 11/26/2014   Morbid obesity ([place]) 11/26/2014   Chronic constipation 11/26/2014   Generalized anxiety disorder 11/11/2014   DDD (degenerative disc disease), lumbar 11/11/2014   Facet arthritis of lumbar region 11/11/2014   Primary osteoarthritis involving multiple joints 11/11/2014   H/O hysterectomy for benign disease 10/20/2014   Low back derangement syndrome 09/30/2014   Bipolar disorder ([place]) 09/30/2014    1. SOB (shortness of breath) Multifactorial including secondary to COPD - Spirometry with Graph - Pulmonary function test; Future - ECHOCARDIOGRAM COMPLETE; Future  2. Obesity, morbid ([place]) Obesity Counseling: Had a lengthy discussion regarding patients BMI and weight issues. Patient was instructed on portion control as well as increased activity. Also discussed caloric restrictions with trying to maintain intake less than 2000 Kcal. Discussions were made in accordance with the 5As of weight management. Simple actions such as not eating late and if able to, taking a walk is suggested.   3. Chronic cough We will need to be further assessed with history of smoking recommended doing follow-up pulmonary functions as well - DG Chest 2 View; Future  4. Nicotine dependence with other nicotine-induced  disorder, unspecified nicotine  product type Smoking cessation instruction/counseling given:  counseled patient on the dangers of tobacco use, advised patient to stop smoking, and reviewed strategies to maximize success   5. Orthopnea Concerning echocardiogram ordered to assess for LV function  6. Other chest pain Appears to be atypical chest pain we will get echocardiogram to further assess may need to have stress test also.  7. OSA (obstructive sleep apnea) Signs and symptoms consistent with obstructive sleep apnea we will get sleep study ordered - PSG Sleep Study; Future   General Counseling: I have discussed the findings of the evaluation and examination with Olin Hauser.  I have also discussed any further diagnostic evaluation thatmay be needed or ordered today. Zilla verbalizes understanding of the findings of todays visit. We also reviewed her medications today and discussed drug interactions and side effects including but not limited excessive drowsiness and altered mental states. We also discussed that there is always a risk not just to her but also people around her. she has been encouraged to call the office with any questions or concerns that should arise related to todays visit.  Orders Placed This Encounter  Procedures   Spirometry with Graph    Order Specific Question:   Where should this test be performed?    Answer:   [facility]    Order Specific Question:   Basic spirometry    Answer:   Yes     Time spent: 57  I have personally obtained a history, examined the patient, evaluated laboratory and imaging results, formulated the assessment and plan and placed orders.    Allyne Gee, MD [facility] Pulmonary and Critical Care Sleep medicine

## 2021-06-23 ENCOUNTER — Other Ambulatory Visit: Payer: Self-pay | Admitting: Family Medicine

## 2021-06-23 DIAGNOSIS — J441 Chronic obstructive pulmonary disease with (acute) exacerbation: Secondary | ICD-10-CM

## 2021-06-23 DIAGNOSIS — J069 Acute upper respiratory infection, unspecified: Secondary | ICD-10-CM

## 2021-06-23 DIAGNOSIS — G894 Chronic pain syndrome: Secondary | ICD-10-CM

## 2021-06-24 ENCOUNTER — Other Ambulatory Visit: Payer: Self-pay

## 2021-06-24 DIAGNOSIS — J441 Chronic obstructive pulmonary disease with (acute) exacerbation: Secondary | ICD-10-CM

## 2021-06-24 DIAGNOSIS — J069 Acute upper respiratory infection, unspecified: Secondary | ICD-10-CM

## 2021-06-24 MED ORDER — ALBUTEROL SULFATE HFA 108 (90 BASE) MCG/ACT IN AERS: 2.0000 | INHALATION_SPRAY | Freq: Four times a day (QID) | RESPIRATORY_TRACT | 3 refills | Status: DC | PRN

## 2021-06-30 ENCOUNTER — Telehealth: Payer: Self-pay

## 2021-06-30 NOTE — Telephone Encounter (Signed)
Lvm letting patient know 07/12/21 appointment w/ dsk has been moved to 07/26/21 after pft-Toni

## 2021-07-12 ENCOUNTER — Ambulatory Visit: Payer: Medicare Other | Admitting: Internal Medicine

## 2021-07-14 ENCOUNTER — Ambulatory Visit: Payer: Self-pay | Admitting: Internal Medicine

## 2021-07-14 DIAGNOSIS — Z0289 Encounter for other administrative examinations: Secondary | ICD-10-CM

## 2021-07-15 ENCOUNTER — Other Ambulatory Visit: Payer: Self-pay

## 2021-07-15 ENCOUNTER — Encounter: Payer: Self-pay | Admitting: Anesthesiology

## 2021-07-15 ENCOUNTER — Ambulatory Visit: Payer: Medicare Other | Attending: Anesthesiology | Admitting: Anesthesiology

## 2021-07-15 DIAGNOSIS — M5136 Other intervertebral disc degeneration, lumbar region: Secondary | ICD-10-CM

## 2021-07-15 DIAGNOSIS — F1190 Opioid use, unspecified, uncomplicated: Secondary | ICD-10-CM

## 2021-07-15 DIAGNOSIS — M545 Low back pain, unspecified: Secondary | ICD-10-CM

## 2021-07-15 DIAGNOSIS — M25552 Pain in left hip: Secondary | ICD-10-CM

## 2021-07-15 DIAGNOSIS — M47816 Spondylosis without myelopathy or radiculopathy, lumbar region: Secondary | ICD-10-CM

## 2021-07-15 DIAGNOSIS — M25551 Pain in right hip: Secondary | ICD-10-CM

## 2021-07-15 DIAGNOSIS — M5431 Sciatica, right side: Secondary | ICD-10-CM

## 2021-07-15 DIAGNOSIS — G894 Chronic pain syndrome: Secondary | ICD-10-CM

## 2021-07-15 DIAGNOSIS — M542 Cervicalgia: Secondary | ICD-10-CM

## 2021-07-15 MED ORDER — OXYCODONE HCL 10 MG PO TABS: 10.0000 mg | ORAL_TABLET | Freq: Every day | ORAL | 0 refills | Status: AC

## 2021-07-15 MED ORDER — OXYCODONE HCL 10 MG PO TABS: 10.0000 mg | ORAL_TABLET | ORAL | 0 refills | Status: AC

## 2021-07-15 NOTE — Progress Notes (Signed)
Virtual Visit via Telephone Note ? ?I connected with Wendy Johnson on 07/15/21 at  4:35 PM EST by telephone and verified that I am speaking with the correct person using two identifiers. ? ?Location: ?Patient: Home ?Provider: [facility] control center ?  ?I discussed the limitations, risks, security and privacy concerns of performing an evaluation and management service by telephone and the availability of in person appointments. I also discussed with the patient that there may be a patient responsible charge related to this service. The patient expressed understanding and agreed to proceed. ? ? ?History of Present Illness: ?I spoke with Wendy Johnson today via telephone as we were unable link for the video portion of the conference and she states that she is recovering slowly from her recent knee surgery.  She still having a lot of anterior patellar knee pain which has been an ongoing problem.  She still has chronic bilateral hip pain which has been stable.  She is having a fair amount of back pain and neck pain both stable in nature.  She is getting some more sciatica symptoms going down the right posterior lateral leg.  In the past she has had epidural steroids for these and generally this knocks out 90 to 100% of her sciatica symptoms for about 2 to 3 months before she has recurrence of a similar pain.  She is inquiring as to whether she can have 1 of these at her next visit in 2 months.  Other than that she is doing well with her medications taking these approximately 4 hours.  No side effects are noted.  The oxycodone takes care of about 75% of her chronic severe and what has been intractable pain.  They do not make her too groggy or constipated and they have been well-tolerated.  Unfortunately she has failed more conservative therapy and this current regimen she reports has been the most effective at keeping her pain under good control and keeping her active and functional. ? ?Review of systems: ?General: No fevers  or chills ?Pulmonary: No shortness of breath or dyspnea ?Cardiac: No angina or palpitations or lightheadedness ?GI: No abdominal pain or constipation ?Psych: No depression ?  ?Observations/Objective: ? ?Current Outpatient Medications:  ?  [START ON 09/04/2021] Oxycodone HCl 10 MG TABS, Take 1 tablet (10 mg total) by mouth 5 (five) times daily., Disp: 150 tablet, Rfl: 0 ?  albuterol (VENTOLIN HFA) 108 (90 Base) MCG/ACT inhaler, Inhale 2 puffs into the lungs every 6 (six) hours as needed for wheezing or shortness of breath., Disp: 18 g, Rfl: 3 ?  amphetamine-dextroamphetamine (ADDERALL) 15 MG tablet, Take 15 mg by mouth daily in the afternoon., Disp: , Rfl:  ?  amphetamine-dextroamphetamine (ADDERALL) 30 MG tablet, Take 30 mg by mouth 2 (two) times daily., Disp: , Rfl:  ?  Aspirin-Caffeine (BC FAST PAIN RELIEF ARTHRITIS PO), Take 1 packet by mouth 2 (two) times daily as needed (pain)., Disp: , Rfl:  ?  busPIRone (BUSPAR) 30 MG tablet, Take 30 mg by mouth 2 (two) times daily. , Disp: , Rfl:  ?  Calcium Carb-Cholecalciferol (CALCIUM 600 + D PO), Take 1 tablet by mouth 2 (two) times daily., Disp: , Rfl:  ?  clonazePAM (KLONOPIN) 1 MG tablet, Take 1-2 mg by mouth 6 (six) times daily., Disp: , Rfl:  ?  cyclobenzaprine (FLEXERIL) 10 MG tablet, Take 10 mg by mouth 3 (three) times daily as needed for muscle spasms., Disp: , Rfl:  ?  dexlansoprazole (DEXILANT) 60 MG capsule,  Take 1 capsule (60 mg total) by mouth daily., Disp: 90 capsule, Rfl: 1 ?  hydrOXYzine (ATARAX) 50 MG tablet, Take by mouth., Disp: , Rfl:  ?  ibuprofen (ADVIL) 800 MG tablet, TAKE 1 TABLET BY MOUTH EVERY 8 HOURS AS NEEDED, Disp: 90 tablet, Rfl: 0 ?  LATUDA 80 MG TABS tablet, Take 80 mg by mouth daily., Disp: , Rfl:  ?  naloxone (NARCAN) nasal spray 4 mg/0.1 mL, As directed for opioid induced respiratory depression, Disp: 1 each, Rfl: 1 ?  nicotine (NICODERM CQ - DOSED IN MG/24 HOURS) 21 mg/24hr patch, Place 1 patch (21 mg total) onto the skin daily., Disp:  28 patch, Rfl: 0 ?  [START ON 08/05/2021] Oxycodone HCl 10 MG TABS, Take 1 tablet (10 mg total) by mouth every 4 (four) hours., Disp: 150 tablet, Rfl: 0 ?  Podiatric Products (GOLD BOND FOOT EX), Apply 1 application topically daily., Disp: , Rfl:  ?  Rimegepant Sulfate (NURTEC) 75 MG TBDP, Take 1 tablet by mouth every other day., Disp: 16 tablet, Rfl: 5 ?  sertraline (ZOLOFT) 100 MG tablet, Take 200 mg by mouth daily., Disp: , Rfl:  ?  traZODone (DESYREL) 100 MG tablet, Take 400 mg by mouth at bedtime. , Disp: , Rfl:  ?  umeclidinium-vilanterol (ANORO ELLIPTA) 62.5-25 MCG/ACT AEPB, Inhale 1 puff into the lungs daily at 6 (six) AM., Disp: 3 each, Rfl: 1 ?  valsartan (DIOVAN) 80 MG tablet, Take 1 tablet (80 mg total) by mouth daily., Disp: 90 tablet, Rfl: 1 ? ?Current Facility-Administered Medications:  ?  ipratropium-albuterol (DUONEB) 0.5-2.5 (3) MG/3ML nebulizer solution 3 mL, 3 mL, Nebulization, Q6H PRN, Devona Konig A, MD, 3 mL at 06/17/21 1039  ? ?Past Medical History:  ?Diagnosis Date  ? ADHD (attention deficit hyperactivity disorder)   ? Anxiety   ? AR (allergic rhinitis)   ? Arthritis   ? Benign hypertension   ? NO MEDS  ? Bipolar disorder ([place])   ? Chronic back pain   ? Chronic constipation   ? Chronic insomnia   ? COPD (chronic obstructive pulmonary disease) ([place])   ? Deaf   ? RIGHT EAR  ? Decreased dorsalis pedis pulse   ? Depression   ? Dyslipidemia   ? Fatty liver   ? GERD (gastroesophageal reflux disease)   ? Hepatomegaly   ? High cholesterol   ? Hot flashes   ? Migraine with aura   ? OCD (obsessive compulsive disorder)   ? Pain in joint, shoulder region 12/19/2018  ? Plantar warts   ? Sciatica, right side 02/18/2019  ? Severe obesity ([place])   ? Shortness of breath dyspnea   ? Sleep apnea   ? NO CPAP  ? Tobacco use   ? Trochanteric bursitis of right hip   ?  ? ?Assessment and Plan: ?1. Chronic pain syndrome   ?2. DDD (degenerative disc disease), lumbar   ?3. Sciatica, right side   ?4. Pain of both hip  joints   ?5. Chronic, continuous use of opioids   ?6. Low back pain at multiple sites   ?7. Facet arthritis of lumbar region   ?8. Cervicalgia   ?Based on our discussion today I think it would be reasonable to have her proceed with a repeat epidural at her next visit.  We will continue on her current pharmacologic regimen with oxycodone 10 mg tablets approximately every 4-6 hours.  I have reviewed the [facility] practitioner database information and it is appropriate for  refill.  I encouraged her to continue efforts at rehabilitation for the knee and directing strengthening and walking as best she can and we will schedule her for an epidural in the 34-monthwindow.  I want her to continue follow-up with Dr. MRudene Christiansand orthopedics and continue follow-up with her primary care physicians for baseline medical care. ? ?Follow Up Instructions: ? ?  ?I discussed the assessment and treatment plan with the patient. The patient was provided an opportunity to ask questions and all were answered. The patient agreed with the plan and demonstrated an understanding of the instructions. ?  ?The patient was advised to call back or seek an in-person evaluation if the symptoms worsen or if the condition fails to improve as anticipated. ? ?I provided 30 minutes of non-face-to-face time during this encounter. ? ? ?JMolli Barrows MD  ?

## 2021-07-19 ENCOUNTER — Other Ambulatory Visit: Payer: Self-pay | Admitting: Family Medicine

## 2021-07-19 DIAGNOSIS — G894 Chronic pain syndrome: Secondary | ICD-10-CM

## 2021-07-21 ENCOUNTER — Ambulatory Visit
Admission: RE | Admit: 2021-07-21 | Discharge: 2021-07-21 | Disposition: A | Discharge status: Home / Self Care | Payer: Medicare Other | Attending: Internal Medicine | Admitting: Internal Medicine

## 2021-07-21 ENCOUNTER — Telehealth: Payer: Self-pay

## 2021-07-21 ENCOUNTER — Ambulatory Visit
Admission: RE | Admit: 2021-07-21 | Discharge: 2021-07-21 | Disposition: A | Discharge status: Home / Self Care | Payer: Medicare Other | Source: Ambulatory Visit | Attending: Internal Medicine | Admitting: Internal Medicine

## 2021-07-21 DIAGNOSIS — R053 Chronic cough: Secondary | ICD-10-CM | POA: Diagnosis present

## 2021-07-21 NOTE — Telephone Encounter (Signed)
Patient scheduled for PSG on 08/08/21 @ [facility] ?

## 2021-07-21 NOTE — Telephone Encounter (Signed)
Left vm to confirm 07/28/21 appointment-Toni ?

## 2021-07-26 ENCOUNTER — Ambulatory Visit: Payer: Medicare Other | Admitting: Internal Medicine

## 2021-07-28 ENCOUNTER — Other Ambulatory Visit: Payer: Medicare Other

## 2021-08-10 ENCOUNTER — Encounter (INDEPENDENT_AMBULATORY_CARE_PROVIDER_SITE_OTHER): Payer: Medicare Other | Admitting: Internal Medicine

## 2021-08-10 DIAGNOSIS — G4710 Hypersomnia, unspecified: Secondary | ICD-10-CM

## 2021-08-10 DIAGNOSIS — G4719 Other hypersomnia: Secondary | ICD-10-CM

## 2021-08-11 ENCOUNTER — Ambulatory Visit: Payer: Medicare Other | Admitting: Internal Medicine

## 2021-08-12 ENCOUNTER — Telehealth: Payer: Self-pay

## 2021-08-12 NOTE — Telephone Encounter (Signed)
Patient is being dismissed from [facility] due to non compliancy and appointment hx. Discharge letter mailed 08-12-21 and copy placed in scan. ?

## 2021-08-12 NOTE — Procedures (Signed)
? ?[facility]  ?Polysomnogram Report ?Part I ? ? ?   ?                                                           Phone: [phone] ?Fax: [phone] ? ?Patient Name: Wendy Johnson, Messmer Acquisition Number: [id number]  ?Date of Birth: May 08, 1966 Acquisition Date: 08/10/2021  ?Referring Physician: Devona Konig, MD    ? ?History: The patient is a 56 year old female who was referred for reevaluation of obstructive sleep apnea. Medical History: ADHD, anxiety, arthritis, benign hypertension, bipolar disorder, chronic back pain, chronic constipation, chronic insomnia, COPD, deaf right ear, decreased dorsalis pedis pulse, depression, dyslipidemia, fatty liver, GERD, hepatomegaly, high cholesterol, hot flashes, migraine with aura, OCD, pain in joint shoulder region, plantar warts, sciatica, right side, SOB, sleep apnea and severe obesity. ? ?Medications: albuterol HFA 90, Adderall, BC fast pain relief, Buspar, calcium 600 + D PO, Klonopin, Flexeril, Dexilant, Atarax, Avil, Latuda, Nicoderm CQ, oxycodone, Nurtec, Zoloft, Desyrel, Anoro Ellipta, Duoneb and Diovan. ? ?Procedure: This routine overnight polysomnogram was performed on the [facility] 5 using the standard diagnostic protocol. This included 6 channels of EEG, 2 channels of EOG, chin EMG, bilateral anterior tibialis EMG, nasal/oral thermistor, PTAF (nasal pressure transducer), chest and abdominal wall movements, EKG, and  pulse oximetry. ? ?Description: The total recording time was 397.6 minutes. The total sleep time was 365.5 minutes. There were a total of 27.3 minutes of wakefulness after sleep onset for a slightly reducedsleep efficiency of 91.9%. The latency to sleep onset was short at 4.8 minutes. The R sleep onset latency was prolonged at 148.0 minutes. Sleep parameters, as a percentage of the total sleep time, demonstrated 10.0% of sleep was in N1 sleep, 45.4% N2, 25.0% N3 and 19.6% R sleep. There were a total of 175 arousals for an arousal index of 28.7 arousals  per hour of sleep that was elevated. ? ?Respiratory monitoring demonstrated nearly continuous moderate to severe degree of snoring in all positions. There were 53 apneas and hypopneas for an Apnea Hypopnea Index of 8.7 apneas and hypopneas per hour of sleep. The REM related apnea hypopnea index was 26.0/hr of REM sleep compared to a NREM AHI of 4.5/hr.  The average duration of the respiratory events was 26.4 seconds with a maximum duration of 74.0 seconds. The respiratory events occurred in all positions, however they were more frequent in the supine position with an AHI of 16.2. The respiratory events were associated with peripheral oxygen desaturations on the average to 87%. The lowest oxygen desaturation associated with a respiratory event was 82%. Additionally, the baseline oxygen saturation during wakefulness was 92%, during NREM sleep averaged 91%, and during REM sleep averaged  92%. The total duration of oxygen < 90% was 59.0 minutes and <80% was 0.0 minutes. ? ?Cardiac monitoring- did not demonstrate transient cardiac decelerations associated with the apneas. There were no significant cardiac rhythm irregularities.  ? ?Periodic limb movement monitoring- demonstrated that  there were 20 periodic limb movements for a periodic limb movement index of 3.3 periodic limb movements per hour of sleep.  ? ?Impression: ?This routine overnight polysomnogram confirms the presence of significant, REM-related obstructive sleep apnea with an overall Apnea Hypopnea Index of 8.7 apneas and hypopneas per hour of sleep, which increased to 26.0  in REM sleep. The REM events were more severe in the supine position. The lowest desaturation was to 82%. The baseline oxygen saturation was low at 91%. The patient has been on supplemental oxygen in the past.  ? ?There were few periodic limb movements that commonly are not significant. Sometimes these limb movements subside once the apnea is controlled. Clinical correlation would be  suggested.  ? ?There was a slightly reduced sleep efficiency with anelevated arousal index. These findings would appear to be due to the obstructive sleep apnea. ? ?Recommendations:    ?A CPAP titration would be recommended for the sleep apnea. Some supine sleep should be ensured to optimize the titration. ?Would recommend weight loss in a patient with a BMI of 40.3.  ? ? ? ?Allyne Gee, MD, FCCP ?Diplomate ABMS-Pulmonary, Critical Care and Sleep Medicine  ?Electronically reviewed and digitally signed ? ? ?[place] [place] ?Polysomnogram Report ?Part II ? ?Phone: [phone] ?Fax: [phone] ? ?Patient last name Johnson Neck Size 15.0 in. Acquisition [phone]  ?Patient first name Wendy Weight 235.0 lbs. Started 08/10/2021 at 10:52:28 PM  ?Birth date 05-24-1965 Height 64.0 in. Stopped 08/11/2021 at 6:02:52 AM  ?Age 84 BMI 40.3 lb/in2 Duration 397.6  ?Study Type Adult      ?Report generated by Paulo Fruit., RPSGT Reviewed by: Richelle Ito. Saunders Glance, PhD, ABSM, FAASM ?Sleep Data: ?Lights Out: 11:00:10 PM Sleep Onset: 11:04:58 PM  ?Lights On: 5:37:46 AM Sleep Efficiency: 91.9 %  ?Total Recording Time: 397.6 min Sleep Latency (from Lights Off) 4.8 min  ?Total Sleep Time (TST): 365.5 min R Latency (from Sleep Onset): 148.0 min  ?Sleep Period Time: 392.5 min Total number of awakenings: 11  ?Wake during sleep: 27.0 min Wake After Sleep Onset (WASO): 27.3 min  ? ?Sleep Data:         Arousal Summary: ?Stage  ?Latency from lights out (min) Latency from sleep onset (min) Duration (min) % Total Sleep Time  ?Normal values  ?N 1 4.8 0.0 36.5 10.0 (5%)  ?N 2 14.8 10.0 166.0 45.4 (50%)  ?N 3 93.3 88.5 91.5 25.0 (20%)  ?R 152.8 148.0 71.5 19.6 (25%)  ? ? Number Index  ?Spontaneous 97 15.9  ?Apneas & Hypopneas 43 7.1  ?RERAs 0 0.0  ?     (Apneas & Hypopneas & RERAs)  (43) (7.1)  ?Limb Movement 35 5.7  ?Snore 1 0.2  ?TOTAL 176 28.9  ? ? ? ?Respiratory Data: ? CA OA MA Apnea Hypopnea* A+ H RERA Total  ?Number 0 0 2 2 51 53 0 53  ?Mean Dur  (sec) 0.0 0.0 17.3 17.3 26.8 26.4 0.0 26.4  ?Max Dur (sec) 0.0 0.0 18.5 18.5 74.0 74.0 0.0 74.0  ?Total Dur (min) 0.0 0.0 0.6 0.6 22.8 23.3 0.0 23.3  ?% of TST 0.0 0.0 0.2 0.2 6.2 6.4 0.0 6.4  ?Index (#/h TST) 0.0 0.0 0.3 0.3 8.4 8.7 0.0 8.7  ?*Hypopneas scored based on 4% or greater desaturation. ? ?Sleep Stage:       ? REM NREM TST  ?AHI 26.0 4.5 8.7  ?RDI 26.0 4.5 8.7  ? ? ? ? ? ? ? ? ? ?Body Position Data: ? Sleep ?(min) TST ?(%) REM ?(min) NREM ?(min) CA ?(#) OA ?(#) MA ?(#) HYP ?(#) AHI ?(#/h) RERA ?(#) RDI ?(#/h) Desat ?(#)  ?Supine 100.2 27.41 18.0 82.2 0 0 0 27 16.2 0 16.2 30  ?Non-Supine 265.30 72.59 53.50 211.80 0.00 0.00 2.00 24.00 5.88 0 5.88 40.00  ?  Left: 158.8 43.45 53.5 105.3 0 0 2 15 6.4 0 6.4 29  ?Right: 106.5 29.14 0.0 106.5 0 0 0 9 5.1 0 5.1 11  ?  ? ?Snoring: ?Total number of snoring episodes  1  ?Total time with snoring 0.1 min (0.0 % of sleep)  ? ?Oximetry Distribution: ?            WK REM NREM TOTAL  ?Average (%)   92 92 91 91  ?< 90% 3.1 6.1 49.8 59.0  ?< 80% 0.0 0.0 0.0 0.0  ?< 70% 0.0 0.0 0.0 0.0  ?# of Desaturations* 2 97 02 63  ?Desat Index (#/hour) 4.5 23.5 8.0 11.3  ?Desat Max (%) '5 10 9 10  '$ ?Desat Max Dur (sec) 38.0 69.0 120.0 120.0  ?Approx Min O2 during sleep 80  ?Approx min O2 during a respiratory event 82  ?Was Oxygen added (Y/N) and final rate No:   0 LPM  ?*Desaturations based on 3% or greater drop from baseline. ? ? Cheyne Stokes Breathing: None Present  ? ?Heart Rate Summary:  ?Average Heart Rate During Sleep 90.3 bpm      ?Highest Heart Rate During Sleep (95th %) 150.0 bpm (artifact)  ?Highest Heart Rate During Sleep 220 bpm (artifact)  ?Highest Heart Rate During Recording (TIB) 228 bpm (artifact)  ? ?Heart Rate Observations: ?Event Type # Events   ?Bradycardia 0 Lowest HR Scored: N/A  ?Sinus Tachycardia During Sleep 0 Highest HR Scored: N/A  ?Narrow Complex Tachycardia 0 Highest HR Scored: N/A  ?Wide Complex Tachycardia 0 Highest HR Scored: N/A  ?Asystole 0 Longest Pause:  N/A  ?Atrial Fibrillation 0 Duration Longest Event: N/A  ?Other Arrythmias  No Type:   ? ?Periodic Limb Movement Data: (Primary legs unless otherwise noted) ?Total # Limb Movement 57 Limb Movement Index 9.4  ?

## 2021-08-13 ENCOUNTER — Telehealth: Payer: Self-pay

## 2021-08-13 NOTE — Telephone Encounter (Signed)
Pt called, LVMTCB to discuss below.  ? ?Copied from [place] [phone]. Topic: General - Inquiry ?>> Aug 13, 2021  9:27 AM Royal Hawthorn, CMA wrote: ?Reason for CRM: Left voicemail to clarify if patient is following up with Dr. Humphrey Rolls for sleep study management/supplies or if Dr. Ancil Boozer needs to order for her. ?

## 2021-08-13 NOTE — Telephone Encounter (Signed)
Left voicemail to clarify if patient is following up with Dr. Humphrey Rolls for sleep study management/supplies or if Dr. Ancil Boozer needs to order for her.  ?

## 2021-08-13 NOTE — Telephone Encounter (Signed)
-----   Message from Steele Sizer, MD sent at 08/13/2021  9:12 AM EDT ----- ?Regarding: sleep study ?Is she following up with Dr. Humphrey Rolls or do I need to order supplies?  ?----- Message ----- ?From: Allyne Gee, MD ?Sent: 08/12/2021   3:27 PM EDT ?To: Steele Sizer, MD ? ? ?

## 2021-08-13 NOTE — Telephone Encounter (Signed)
Pt called, states she was dismissed from Dr. Humphrey Rolls for missing too many appts but she has done the sleep study and CXR but needs referral for new Pulmonologist. Advised pt I will send this to Dr. Ancil Boozer and follow back up with her. Pt understood.  ?

## 2021-08-16 ENCOUNTER — Ambulatory Visit: Payer: Medicare Other | Admitting: Nurse Practitioner

## 2021-08-17 ENCOUNTER — Telehealth: Payer: Self-pay

## 2021-08-17 ENCOUNTER — Encounter: Payer: Self-pay | Admitting: Internal Medicine

## 2021-08-17 NOTE — Telephone Encounter (Signed)
Pt has an appt with Dr Ancil Boozer for 10/05/21 ?

## 2021-08-17 NOTE — Telephone Encounter (Signed)
Patient scheduled for cpap titration on 08/18/21 at [facility] ?

## 2021-08-18 ENCOUNTER — Encounter (INDEPENDENT_AMBULATORY_CARE_PROVIDER_SITE_OTHER): Payer: Medicare Other | Admitting: Internal Medicine

## 2021-08-18 DIAGNOSIS — G4733 Obstructive sleep apnea (adult) (pediatric): Secondary | ICD-10-CM | POA: Diagnosis not present

## 2021-08-20 NOTE — Procedures (Signed)
? ? ? ? ?[place]  ?Polysomnogram Report ?Part I ? ?Phone: [phone] ?Fax: [phone] ? ?Patient Name: Wendy Johnson, Novitsky. Acquisition Number: [id number]  ?Date of Birth: April 09, 1966 Acquisition Date: 08/18/2021  ?Referring Physician: Allyne Gee MD    ? ?History: The patient is a 56 year old female with obstructive sleep apnea for CPAP titration. Medical History: ADHD, anxiety, AR, arthritis, hypertension, bipolar disorder, COPD, deaf right ear, depression, dyslipidemia, GERD, SOB, OSA, obesity. ? ?Medications: albuterol, Adderall, buspar, Klonopin, Flexeril, Dexilant, Atarax, Latuda, oxycodone, Nurtec, Zoloft, Desyrel, Diovan, Duoneb. ? ?Procedure: This routine overnight polysomnogram was performed on the [facility] 5 using the standard CPAP protocol. This included 6 channels of EEG, 2 channels of EOG, chin EMG, bilateral anterior tibialis EMG, nasal/oral thermistor, PTAF (nasal pressure transducer), chest and abdominal wall movements, EKG, and pulse oximetry. ? ?Description: The total recording time was 429.0 minutes. The total sleep time was 382.5 minutes. There were a total of 25.0 minutes of wakefulness after sleep onset for a slightly reducedsleep efficiency of 89.2%. The latency to sleep onset was within normal limitsat 21.5 minutes. The R sleep onset latency was N/A.  Sleep parameters, as a percentage of the total sleep time, demonstrated 3.5% of sleep was in N1 sleep, 96.5% N2, 0.0% N3 and 0.0% R sleep. There were a total of 36 arousals for an arousal index of 5.6 arousals per hour of sleep that was normal. ? ?Overall, there were a total of 9 respiratory events for a respiratory disturbance index, which includes apneas, hypopneas and RERAs (increased respiratory effort) of 1.4 respiratory events per hour of sleep during the pressure titration. CPAP was initiated at 5 cm H2O at lights out, 10:16 p.m. It was titrated in 1 cm increments for occasional hypopneas and flow limitation to 7 cm H2O. The  apnea was controlled at this pressure but the pressure was further titrated to the final pressure of  8 cm H2O for flow limitation. Supine but no REM sleep were observed ? ?Additionally, the baseline oxygen saturation during wakefulness was 92%, during NREM sleep averaged 91%, and during REM sleep averaged N/A.The total duration of oxygen < 90% was 46.3 minutes and <80% was 0.0 minutes. ? ?Cardiac monitoring- There were no significant cardiac rhythm irregularities.  ? ?Periodic limb movement monitoring- did not demonstrate periodic limb movements.  ? ? ? ? ?Impression: ?This patient's obstructive sleep apnea demonstrated significant improvement with the utilization of nasal CPAP at 8 cm H2O, however REM sleep was not observed and the patient's apnea was more severe in REM sleep during the prior PSG. Baseline oxygen saturation was borderline at 92%. ? ?Recommendations: ?Would recommend utilization of auto-adjusting CPAP at 8-20 cm H2O.      ?An AirFit F20 mask, medium size , was used. Chin strap used during study- no . Humidifier used during study- yes .  ? ? ? ?Allyne Gee, MD, FCCP ?Diplomate ABMS-Pulmonary, Critical Care and Sleep Medicine  ?Electronically reviewed and digitally signed ? ? ?[place] ?CPAP/BIPAP Polysomnogram Report ?Part II ?Phone: [phone] ?Fax: [phone] ? ?Patient last name Prill Neck Size 15.0 in. Acquisition [id number]  ?Patient first name Wendy Johnson. Weight 235.0 lbs. Started 08/18/2021 at 10:08:06 PM  ?Birth date [birth year] Height 64.0 in. Stopped 08/19/2021 at 5:28:00 AM  ?Age 21      ?Type Adult BMI 40.3 lb/in2 Duration 429.0  ?Margaretmary Eddy Sleep Tech, Ellan Lambert RPSGT  Reviewed by: Richelle Ito. Saunders Glance, PhD, ABSM, FAASM ?Sleep  Data: ?Lights Out: 10:16:06 PM Sleep Onset: 10:37:36 PM  ?Lights On: 5:25:06 AM Sleep Efficiency: 89.0 %  ?Total Recording Time: 429.0 min Sleep Latency (from Lights Off) 21.5 min  ?Total Sleep Time (TST): 382.0 min R Latency (from Sleep Onset): N/A   ?Sleep Period Time: 407.0 min Total number of awakenings: 7  ?Wake during sleep: 25.0 min Wake After Sleep Onset (WASO): 25.5 min  ? ?Sleep Data:         Arousal Summary: ?Stage  ?Latency from lights out (min) Latency from sleep onset (min) Duration (min) % Total Sleep Time  ?Normal values  ?N 1 21.5 0.0 13.5 3.5 (5%)  ?N 2 22.0 0.5 368.5 96.5 (50%)  ?N 3       0.0 0.0 (20%)  ?R N/A N/A 0.0 0.0 (25%)  ? ? Number Index  ?Spontaneous 33 5.2  ?Apneas & Hypopneas 2 0.3  ?RERAs 0 0.0  ?     (Apneas & Hypopneas & RERAs)  (2) (0.3)  ?Limb Movement 0 0.0  ?Snore 0 0.0  ?TOTAL 35 5.5  ? ? ? ?Respiratory Data: ? CA OA MA Apnea Hypopnea* A+ H RERA Total  ?Number 0 0 0 0 9 9 0 9  ?Mean Dur (sec) 0.0 0.0 0.0 0.0 18.2 18.2 0.0 18.2  ?Max Dur (sec) 0.0 0.0 0.0 0.0 23.0 23.0 0.0 23.0  ?Total Dur (min) 0.0 0.0 0.0 0.0 2.7 2.7 0.0 2.7  ?% of TST 0.0 0.0 0.0 0.0 0.7 0.7 0.0 0.7  ?Index (#/h TST) 0.0 0.0 0.0 0.0 1.4 1.4 0.0 1.4  ?*Hypopneas scored based on 4% or greater desaturation. ? ?Sleep Stage:     ? ? ? ? REM NREM TST  ?AHI N/A 1.4 1.4  ?RDI N/A 1.4 1.4  ? ? Sleep ?(min) TST ?(%) REM ?(min) NREM ?(min) CA ?(#) OA ?(#) MA ?(#) HYP ?(#) AHI ?(#/h) RERA ?(#) RDI ?(#/h) Desat ?(#)  ?Supine 276.0 72.25 0.0 276.0 0 0 0 7 1.5 0 1.5 23  ?Non-Supine 106.00 27.75 0.00 106.00 0.00 0.00 0.00 2.00 1.13 0 1.13 2.00  ?Left: 0.0 0.00 0.0 0.0 0 0 0 0 0.0 0 0.00 0  ?Right: 106.0 27.75 0.0 106.0 0 0 0 2 1.1 0 1.1 2  ?  ? ?Snoring: ?Total number of snoring episodes  0  ?Total time with snoring    min (   % of sleep)  ? ?Oximetry Distribution: ?            WK REM NREM TOTAL  ?Average (%)   92    91 91  ?< 90% 1.0 0.0 45.3 46.3  ?< 80% 0.0 0.0 0.0 0.0  ?< 70% 0.0 0.0 0.0 0.0  ?# of Desaturations* 0 0 25 25  ?Desat Index (#/hour) 0.0    4.0 4.0  ?Desat Max (%) 0 0 12 12  ?Desat Max Dur (sec) 0.0 0.0 115.0 115.0  ?Approx Min O2 during sleep 81  ?Approx min O2 during a respiratory event 85  ?Was Oxygen added (Y/N) and final rate No:   0 LPM   ?*Desaturations based on 3% or greater drop from baseline. ? ? Cheyne Stokes Breathing: None Present  ? ? ?Heart Rate Summary:  ?Average Heart Rate During Sleep 68.7 bpm      ?Highest Heart Rate During Sleep (95th %) 79.0 bpm      ?Highest Heart Rate During Sleep 92 bpm      ?Highest Heart Rate During Recording (TIB) 148  bpm      ? ?Heart Rate Observations: ?Event Type # Events   ?Bradycardia 0 Lowest HR Scored: N/A  ?Sinus Tachycardia During Sleep 0 Highest HR Scored: N/A  ?Narrow Complex Tachycardia 0 Highest HR Scored: N/A  ?Wide Complex Tachycardia 0 Highest HR Scored: N/A  ?Asystole 1 Longest Pause: 3.0 bpm  ?Atrial Fibrillation 0 Duration Longest Event: N/A  ?Other Arrythmias  No Type:   ?Periodic Limb Movement Data: (Primary legs unless otherwise noted) ?Total # Limb Movement 0 Limb Movement Index 0.0  ?Total # PLMS    PLMS Index     ?Total # PLMS Arousals    PLMS Arousal Index     ?Percentage Sleep Time with PLMS   min (   % sleep)  ?Mean Duration limb movements (secs)     ? ? ?IPAP Level ?(cmH2O) EPAP Level ?(cmH2O) Total Duration (min) Sleep Duration (min) Sleep (%) REM (%) CA  #) OA # MA # HYP #) AHI ?(#/hr) RERAs # RERAs (#/hr) RDI (#/hr)  ?5 5 171.1 159.8 93.4 0.0 0 0 0 4 1.5 0 0.0 1.5  ?6 6 143.2 139.2 97.2 0.0 0 0 0 4 1.7 0 0.0 1.7  ?7 7 68.4 66.4 97.1 0.0 0 0 0 0 0.0 0 0.0 0.0  ?8 8 15.6 15.6 100.0 0.0 0 0 0 1 3.8 0 0.0 3.8  ?                                           ?                                           ?                                           ?                                           ?                                           ?                                           ?                                           ?                                           ?                                           ?                                           ?                                           ?                                           ?                                           ?                                            ?                                           ?                                           ?                                           ? ? ? ? ? ? ?

## 2021-08-24 ENCOUNTER — Other Ambulatory Visit: Payer: Self-pay

## 2021-08-24 ENCOUNTER — Telehealth: Payer: Self-pay | Admitting: Family Medicine

## 2021-08-24 ENCOUNTER — Ambulatory Visit: Payer: Self-pay | Admitting: *Deleted

## 2021-08-24 ENCOUNTER — Encounter: Payer: Self-pay | Admitting: Internal Medicine

## 2021-08-24 ENCOUNTER — Telehealth: Payer: Self-pay

## 2021-08-24 ENCOUNTER — Ambulatory Visit (INDEPENDENT_AMBULATORY_CARE_PROVIDER_SITE_OTHER): Payer: Medicare Other | Admitting: Family Medicine

## 2021-08-24 VITALS — BP 130/80 | HR 96 | Temp 98.1°F | Resp 18 | Ht 64.0 in | Wt 240.6 lb

## 2021-08-24 DIAGNOSIS — G4733 Obstructive sleep apnea (adult) (pediatric): Secondary | ICD-10-CM | POA: Diagnosis not present

## 2021-08-24 DIAGNOSIS — L0292 Furuncle, unspecified: Secondary | ICD-10-CM

## 2021-08-24 DIAGNOSIS — Z1231 Encounter for screening mammogram for malignant neoplasm of breast: Secondary | ICD-10-CM

## 2021-08-24 DIAGNOSIS — J449 Chronic obstructive pulmonary disease, unspecified: Secondary | ICD-10-CM | POA: Diagnosis not present

## 2021-08-24 DIAGNOSIS — Z23 Encounter for immunization: Secondary | ICD-10-CM | POA: Diagnosis not present

## 2021-08-24 MED ORDER — TRELEGY ELLIPTA 100-62.5-25 MCG/ACT IN AEPB: 1.0000 | INHALATION_SPRAY | Freq: Every day | RESPIRATORY_TRACT | 11 refills | Status: DC

## 2021-08-24 NOTE — Telephone Encounter (Signed)
Patient requesting a referral to a new pulmonologist, patient states she had to cancel the same day appointment due to her transportation and specialist advised they were unable to  [facility] ?

## 2021-08-24 NOTE — Assessment & Plan Note (Signed)
Healing. Continue conservative care. ?

## 2021-08-24 NOTE — Patient Instructions (Addendum)
It was great to see you! ? ?Our plans for today:  ?- Let us know if you don't hear about appointments with Pulmonology and Bariatric Surgery  ?- Take your CPAP order to the medical supply store. ?- Make an appointment with your PCP soon.  ? ?Take care and seek immediate care sooner if you develop any concerns.  ? ?Dr. Ky Barban ? ?

## 2021-08-24 NOTE — Progress Notes (Signed)
? ?  SUBJECTIVE:  ? ?CHIEF COMPLAINT / HPI:  ? ?Emphysema/COPD, OSA ?- endorsing more wheezing ?- previously saw Dr. Humphrey Rolls, dismissed for no show, needs new pulm. ?- Medications: anoro, albuterol prn. Previously given trelegy by pulm but ran out. ?- Compliance: has been out for a few weeks. Using albuterol 1-2x per day.  ?- Exacerbations in last 6 months: 04/2021 ?- CPAP? Needs new machine ?- sleeps with O2, 2L nocturnal. Needs new order for oxygen.  ?- has h/o GERD, on dexilant ? ?Ear bump ?- 1 week duration ?- has been picking at it with purulent drainage few days ago. ?- putting neopsopin on it, peroxide. Helping some. ?- no fevers.  ? ?Morbid obesity - previously referred to bariatric surgery in 2019, wants to be referred again. On a few psychiatric meds that make her gain weight. Follows with psychiatry, Dr. Halford Chessman. Also previously on ozempic but didn't feel it helped.  ? ? ?OBJECTIVE:  ? ?BP 130/80   Pulse 96   Temp 98.1 ?F (36.7 ?C) (Oral)   Resp 18   Ht '5\' 4"'$  (1.626 m)   Wt 240 lb 9.6 oz (109.1 kg)   LMP 09/26/2014 Comment: Total  SpO2 98%   BMI 41.30 kg/m?   ?Gen: well appearing, in NAD. Obese. ?Card: RRR ?Lungs: CTAB ?Skin: Few mm well healing boil with surrounding erythema to intertragal notch of R ear, no fluctuance or induration noted.  ?Ext: WWP, no edema ? ? ?ASSESSMENT/PLAN:  ? ?Problem List Items Addressed This Visit   ? ?  ? Respiratory  ? COPD, moderate ([place])  ?  Chronic, not currently exacerbated. Trelegy sent to pharmacy. New pulm referral generated. DME orders for CPAP and oxygen written. F/u with PCP as scheduled.  ? ?  ?  ? Relevant Medications  ? Fluticasone-Umeclidin-Vilant (TRELEGY ELLIPTA) 100-62.5-25 MCG/ACT AEPB  ? Other Relevant Orders  ? Ambulatory referral to Pulmonology  ? For home use only DME continuous positive airway pressure (CPAP)  ? For home use only DME oxygen  ? Sleep apnea  ? Relevant Orders  ? For home use only DME continuous positive airway pressure (CPAP)  ?  ?  Musculoskeletal and Integument  ? Boil  ?  Healing. Continue conservative care. ? ?  ?  ?  ? Other  ? Morbid obesity ([place])  ? Relevant Orders  ? Amb Referral to Bariatric Surgery  ? ?Other Visit Diagnoses   ? ? Encounter for screening mammogram for malignant neoplasm of breast    -  Primary  ? Relevant Orders  ? MM Digital Screening  ? ?  ? ? ? ?Myles Gip, DO ?

## 2021-08-24 NOTE — Assessment & Plan Note (Signed)
Chronic, not currently exacerbated. Trelegy sent to pharmacy. New pulm referral generated. DME orders for CPAP and oxygen written. F/u with PCP as scheduled.  ?

## 2021-08-24 NOTE — Telephone Encounter (Signed)
?  Chief Complaint: Requesting Inhaler ?Symptoms: wheezing "I just need that inhaler."  ?Frequency: This AM ?Pertinent Negatives: Patient denies SOB ?Disposition: '[]'$ ED /'[]'$ Urgent Care (no appt availability in office) / '[x]'$ Appointment(In office/virtual)/ '[]'$  [facility]/ '[]'$ Home Care/ '[]'$ Refused Recommended Disposition /'[]'$ [facility]/ '[]'$  Follow-up with PCP ?Additional Notes: Pt had called to request trelegy inhaler. States pulmonologist had given her sample, then "Dropped me from practice when I had to cancel appt after I paid him $100.00"  States out of sample 08/11/21. Also requesting referral to pulmonologist. States needs new CPAP as well. Appt secured today with Dr. Ky Barban. Reason for Disposition ? [1] MILD difficulty breathing (e.g., minimal/no SOB at rest, SOB with walking, pulse <100) AND [2] NEW-onset or WORSE than normal ? ?Answer Assessment - Initial Assessment Questions ?1. RESPIRATORY STATUS: "Describe your breathing?" (e.g., wheezing, shortness of breath, unable to speak, severe coughing)  ?    No SOB ?2. ONSET: "When did this breathing problem begin?"  ?    Yesterday ?3. PATTERN "Does the difficult breathing come and go, or has it been constant since it started?"  ?    Constant ow ?4. SEVERITY: "How bad is your breathing?" (e.g., mild, moderate, severe)  ?  - MILD: No SOB at rest, mild SOB with walking, speaks normally in sentences, can lie down, no retractions, pulse < 100.  ?  - MODERATE: SOB at rest, SOB with minimal exertion and prefers to sit, cannot lie down flat, speaks in phrases, mild retractions, audible wheezing, pulse 100-120.  ?  - SEVERE: Very SOB at rest, speaks in single words, struggling to breathe, sitting hunched forward, retractions, pulse > 120  ?    Denies ?5. RECURRENT SYMPTOM: "Have you had difficulty breathing before?" If Yes, ask: "When was the last time?" and "What happened that time?"  ?    *No Answer* ?6. CARDIAC HISTORY: "Do you have any history of heart  disease?" (e.g., heart attack, angina, bypass surgery, angioplasty)  ?    *No Answer* ?7. LUNG HISTORY: "Do you have any history of lung disease?"  (e.g., pulmonary embolus, asthma, emphysema) ?    *No Answer* ?8. CAUSE: "What do you think is causing the breathing problem?"  ?    *No Answer* ?9. OTHER SYMPTOMS: "Do you have any other symptoms? (e.g., dizziness, runny nose, cough, chest pain, fever) ?    *No Answer* ?10. O2 SATURATION MONITOR:  "Do you use an oxygen saturation monitor (pulse oximeter) at home?" If Yes, "What is your reading (oxygen level) today?" "What is your usual oxygen saturation reading?" (e.g., 95%) ?      *No Answer* ? ?Protocols used: Breathing Difficulty-A-AH ? ?

## 2021-08-24 NOTE — Telephone Encounter (Signed)
Placed signed CMN order in feeling great folder.tat ?

## 2021-08-24 NOTE — Telephone Encounter (Signed)
Medication Refill - Medication: trelegy inhaler (patient states she needs a new pulmonologist ) and would like PCP to prescribed until she gets established with specialist ? ?Has the patient contacted their pharmacy? Yes.   ? ?(Agent: If no, request that the patient contact the pharmacy for the refill. If patient does not wish to contact the pharmacy document the reason why and proceed with request.) ? ? ?Preferred Pharmacy (with phone number or street name):  ?[facility], [place] Phone:  [phone]  ?Fax:  [phone]  ?  ? ? ?Has the patient been seen for an appointment in the last year OR does the patient have an upcoming appointment? Yes.   ? ?Agent: Please be advised that RX refills may take up to 3 business days. We ask that you follow-up with your pharmacy. ?

## 2021-09-06 NOTE — Progress Notes (Deleted)
Name: Wendy Johnson   MRN: [id number]    DOB: Jul 17, 1965   Date:09/06/2021       Progress Note  Subjective  Chief Complaint  Follow Up  HPI  GERD: she continues to have heart burn, had normal EGD back in the Spring of 2021, she still takes ibuprofen but only prn, she takes Dexilant as prescribed, it does not seem to work like it used to, she has regurgitation. She has been smoking more , at times 2 packs per day, explained she needs to cut down on nicotine and follow and GERD diet   HTN: she is taking Diovan 80 mg and denies chest pain or palpitation. She is compliant and denies side effects BP is at goal    History of DM : A1C has been normal for a while now and is off medication Denies polyphagia, polydipsia or polyuria. She still has low HDL, LDL elevated and triglycerides up. She has not been taking Atorvastatin and stopped Lovaza, reviewed last labs    Dyslipidemia:  She has high triglycerides , low HDl and high LDL, she has been off medications, we will recheck it today. She states cholesterol medication was given her headaches and Lovaza made reflux symptoms worse    DDD lumbar spine: she sees pain clinic - Dr Andree Elk, she is on Oxycodone 10 mg QID, denies constipation - controlled with Benifiber, she has narcan at home. She also has OA of knee and shoulders and sees Ortho at [facility] and has steroid injections . She had a revision of right total knee 10/22 , she continues to have daily pain, and needs ibuprofen to take prn    Morbid obesity: her maximum weight was around 273 lbs, she lost down to 184 lbs with physical activity and life style modification, she gradually gained weight back, went up to 250 lbs, last visit in July was 244 lbs and today is down to 238 lbs. She is cutting down on portion size and also going to the gym now  Bipolar disorder: on multiple medications , given by Dr. Kasandra Knudsen . Reviewed medications with her She states her mood is stable.    COPD : she states that  she could not tolerate Breo, she is on Anoro now and denies side effects. She wants to stop smoking, she asked for nicotine patch , she has been smoking half to two packs per day and has noticed increase in sob, she has morning cough and occasional wheezing. She would like to go back to see Dr. Humphrey Rolls    Alcohol history: she completely quit Feb 2017 but had a relapse, she has quit again since June 2020. Continue hard work   Migraine headaches: she did for a while, unable to tolerate Imitrex, she has noticed increase in headaches, multiple times a week, states woke this morning with throbbing sensation on frontal area, she gets nauseated, at times vomiting. Denies phonophobia but has phonophobia.  Patient Active Problem List   Diagnosis Date Noted   Boil 08/24/2021   S/P revision of total knee, right 02/11/2021   Esophageal dysphagia    History of colonic polyps    Sciatica, right side 02/18/2019   Pain in joint, shoulder region 12/19/2018   Unspecified inflammatory spondylopathy, lumbar region ([place]) 10/04/2018   BMI 40.0-44.9, adult ([place]) 08/17/2018   Lumbar spondylosis 03/19/2018   Polyarthralgia 03/19/2018   History of alcoholism ([place]) 07/19/2017   Bilateral carpal tunnel syndrome 05/29/2017   Painful total knee replacement, right ([place])  10/11/2016   Rotator cuff tendinitis, right 07/29/2016   Occipital neuralgia 10/12/2015   Instability of prosthetic knee ([place]) 09/22/2015   Primary osteoarthritis of right hip 05/12/2015   Sleep apnea 04/07/2015   Primary osteoarthritis of left hip 78/67/6720   Metabolic syndrome 94/70/9628   Migraine without aura and without status migrainosus, not intractable 11/26/2014   GERD without esophagitis 11/26/2014   COPD, moderate ([place]) 11/26/2014   Nocturnal oxygen desaturation 11/26/2014   Supplemental oxygen dependent 11/26/2014   Hearing loss 11/26/2014   Pain syndrome, chronic 11/26/2014   History of hypertension 11/26/2014   Dyslipidemia  11/26/2014   Morbid obesity ([place]) 11/26/2014   Chronic constipation 11/26/2014   Generalized anxiety disorder 11/11/2014   DDD (degenerative disc disease), lumbar 11/11/2014   Facet arthritis of lumbar region 11/11/2014   Primary osteoarthritis involving multiple joints 11/11/2014   H/O hysterectomy for benign disease 10/20/2014   Low back derangement syndrome 09/30/2014   Bipolar disorder ([place]) 09/30/2014    Past Surgical History:  Procedure Laterality Date   ABDOMINAL HYSTERECTOMY N/A 10/20/2014   Procedure: Total abdominial hysterectomy, bilateral salpingo-oophorectomy;  Surgeon: Brayton Mars, MD;  Location: [facility];  Service: Gynecology;  Laterality: N/A;   BILATERAL SALPINGOOPHORECTOMY     bone spurs removed Bilateral    CARPAL TUNNEL RELEASE Left 06/08/2017   Procedure: CARPAL TUNNEL RELEASE;  Surgeon: Hessie Knows, MD;  Location: [facility];  Service: Orthopedics;  Laterality: Left;   CARPAL TUNNEL RELEASE Right 10/10/2017   Procedure: CARPAL TUNNEL RELEASE;  Surgeon: Hessie Knows, MD;  Location: [facility];  Service: Orthopedics;  Laterality: Right;   COLONOSCOPY WITH PROPOFOL N/A 09/01/2016   Procedure: COLONOSCOPY WITH PROPOFOL;  Surgeon: Jonathon Bellows, MD;  Location: [facility];  Service: Endoscopy;  Laterality: N/A;   COLONOSCOPY WITH PROPOFOL N/A 07/30/2019   Procedure: COLONOSCOPY WITH PROPOFOL;  Surgeon: Lucilla Lame, MD;  Location: [facility] ENDOSCOPY;  Service: Endoscopy;  Laterality: N/A;   DORSAL COMPARTMENT RELEASE Left 06/08/2017   Procedure: RELEASE DORSAL COMPARTMENT (DEQUERVAIN);  Surgeon: Hessie Knows, MD;  Location: [facility];  Service: Orthopedics;  Laterality: Left;   ESOPHAGOGASTRODUODENOSCOPY (EGD) WITH PROPOFOL N/A 09/01/2016   Procedure: ESOPHAGOGASTRODUODENOSCOPY (EGD) WITH PROPOFOL;  Surgeon: Jonathon Bellows, MD;  Location: [facility];  Service: Endoscopy;  Laterality: N/A;   ESOPHAGOGASTRODUODENOSCOPY (EGD) WITH PROPOFOL N/A 07/30/2019   Procedure:  ESOPHAGOGASTRODUODENOSCOPY (EGD) WITH PROPOFOL;  Surgeon: Lucilla Lame, MD;  Location: [facility]OSCOPY;  Service: Endoscopy;  Laterality: N/A;   FOOT SURGERY Bilateral    INSERTION OF MESH N/A 12/26/2017   Procedure: INSERTION OF MESH;  Surgeon: Jules Husbands, MD;  Location: [facility];  Service: General;  Laterality: N/A;   JOINT REPLACEMENT Right    Total Knee replacement X 2   JOINT REPLACEMENT Bilateral    Total Hip Replacement   knee arthroscopo Right    LAPAROSCOPY  09/22/2014   Procedure: LAPAROSCOPY OPERATIVE;  Surgeon: Brayton Mars, MD;  Location: [facility];  Service: Gynecology;;  excision and fulgeration of endomertriosis   LIPOMA EXCISION     ROBOTIC ASSISTED LAPAROSCOPIC VENTRAL/INCISIONAL HERNIA REPAIR N/A 12/26/2017   Procedure: ROBOTIC ASSISTED LAPAROSCOPIC VENTRAL/INCISIONAL [place];  Surgeon: Jules Husbands, MD;  Location: [facility];  Service: General;  Laterality: N/A;   TONSILLECTOMY     TOTAL HIP ARTHROPLASTY Left 12/16/2014   Procedure: TOTAL HIP ARTHROPLASTY ANTERIOR APPROACH;  Surgeon: Hessie Knows, MD;  Location: [facility];  Service: Orthopedics;  Laterality: Left;   TOTAL HIP ARTHROPLASTY Right 05/12/2015  Procedure: TOTAL HIP ARTHROPLASTY ANTERIOR APPROACH;  Surgeon: Hessie Knows, MD;  Location: [facility];  Service: Orthopedics;  Laterality: Right;   TOTAL KNEE REVISION Right 09/22/2015   Procedure: TOTAL KNEE REVISION/ REVISE POLYIETHYLENE;  Surgeon: Hessie Knows, MD;  Location: [facility];  Service: Orthopedics;  Laterality: Right;   TOTAL KNEE REVISION Right 10/11/2016   Procedure: TOTAL KNEE REVISION;  Surgeon: Hessie Knows, MD;  Location: [facility];  Service: Orthopedics;  Laterality: Right;   TOTAL KNEE REVISION Right 02/11/2021   Procedure: TOTAL KNEE REVISION;  Surgeon: Hessie Knows, MD;  Location: [facility];  Service: Orthopedics;  Laterality: Right;   TUBAL LIGATION     VENTRAL HERNIA REPAIR N/A 11/08/2018   Procedure: HERNIA REPAIR VENTRAL ADULT OPEN.  DIABETIC, SLEEP APNEA;  Surgeon: Jules Husbands, MD;  Location: [facility];  Service: General;  Laterality: N/A;    Family History  Problem Relation Age of Onset   Diabetes Mother    Heart disease Father    Heart attack Father    Diabetes Sister    Breast cancer Maternal Aunt        <50   Breast cancer Paternal Aunt        x2.  <50   Healthy Son    Drug abuse Sister     Social History   Tobacco Use   Smoking status: Every Day    Packs/day: 0.50    Years: 37.00    Pack years: 18.50    Types: Cigarettes    Start date: 11/26/1979   Smokeless tobacco: Never  Substance Use Topics   Alcohol use: No    Alcohol/week: 0.0 standard drinks    Comment: but used to be a heavy drinker, quit after DUI 2021     Current Outpatient Medications:    albuterol (VENTOLIN HFA) 108 (90 Base) MCG/ACT inhaler, Inhale 2 puffs into the lungs every 6 (six) hours as needed for wheezing or shortness of breath., Disp: 18 g, Rfl: 3   amphetamine-dextroamphetamine (ADDERALL) 15 MG tablet, Take 15 mg by mouth daily in the afternoon., Disp: , Rfl:    amphetamine-dextroamphetamine (ADDERALL) 30 MG tablet, Take 30 mg by mouth 2 (two) times daily., Disp: , Rfl:    Aspirin-Caffeine (BC FAST PAIN RELIEF ARTHRITIS PO), Take 1 packet by mouth 2 (two) times daily as needed (pain)., Disp: , Rfl:    busPIRone (BUSPAR) 30 MG tablet, Take 30 mg by mouth 2 (two) times daily. , Disp: , Rfl:    Calcium Carb-Cholecalciferol (CALCIUM 600 + D PO), Take 1 tablet by mouth 2 (two) times daily., Disp: , Rfl:    clonazePAM (KLONOPIN) 1 MG tablet, Take 1-2 mg by mouth 6 (six) times daily., Disp: , Rfl:    cyclobenzaprine (FLEXERIL) 10 MG tablet, Take 10 mg by mouth 3 (three) times daily as needed for muscle spasms., Disp: , Rfl:    dexlansoprazole (DEXILANT) 60 MG capsule, Take 1 capsule (60 mg total) by mouth daily., Disp: 90 capsule, Rfl: 1   Fluticasone-Umeclidin-Vilant (TRELEGY ELLIPTA) 100-62.5-25 MCG/ACT AEPB, Inhale 1 puff into  the lungs daily., Disp: 1 each, Rfl: 11   hydrOXYzine (ATARAX) 50 MG tablet, Take by mouth., Disp: , Rfl:    ibuprofen (ADVIL) 800 MG tablet, TAKE 1 TABLET BY MOUTH EVERY 8 HOURS AS NEEDED, Disp: 90 tablet, Rfl: 0   LATUDA 80 MG TABS tablet, Take 80 mg by mouth daily., Disp: , Rfl:    naloxone (NARCAN) nasal spray 4 mg/0.1 mL, As directed  for opioid induced respiratory depression, Disp: 1 each, Rfl: 1   nicotine (NICODERM CQ - DOSED IN MG/24 HOURS) 21 mg/24hr patch, Place 1 patch (21 mg total) onto the skin daily., Disp: 28 patch, Rfl: 0   Oxycodone HCl 10 MG TABS, Take 1 tablet (10 mg total) by mouth 5 (five) times daily., Disp: 150 tablet, Rfl: 0   Podiatric Products (GOLD BOND FOOT EX), Apply 1 application topically daily., Disp: , Rfl:    Rimegepant Sulfate (NURTEC) 75 MG TBDP, Take 1 tablet by mouth every other day., Disp: 16 tablet, Rfl: 5   sertraline (ZOLOFT) 100 MG tablet, Take 200 mg by mouth daily., Disp: , Rfl:    traZODone (DESYREL) 100 MG tablet, Take 400 mg by mouth at bedtime. , Disp: , Rfl:    valsartan (DIOVAN) 80 MG tablet, Take 1 tablet (80 mg total) by mouth daily., Disp: 90 tablet, Rfl: 1  Current Facility-Administered Medications:    ipratropium-albuterol (DUONEB) 0.5-2.5 (3) MG/3ML nebulizer solution 3 mL, 3 mL, Nebulization, Q6H PRN, Devona Konig A, MD, 3 mL at 06/17/21 1039  Allergies  Allergen Reactions   Codeine Nausea Only   Imitrex [Sumatriptan] Other (See Comments)    chest pain    I personally reviewed active problem list, medication list, allergies, family history, social history, health maintenance with the patient/caregiver today.   ROS  ***  Objective  There were no vitals filed for this visit.  There is no height or weight on file to calculate BMI.  Physical Exam ***  No results found for this or any previous visit (from the past 2160 hour(s)).   PHQ2/9:    08/24/2021    2:03 PM 05/19/2021    8:16 AM 04/20/2021    1:09 PM 12/14/2020     2:13 PM 11/16/2020   11:00 AM  Depression screen PHQ 2/9  Decreased Interest 0 1 0 0 0  Down, Depressed, Hopeless 0 1 0 0 0  PHQ - 2 Score 0 2 0 0 0  Altered sleeping  3 0  2  Tired, decreased energy  3 0  1  Change in appetite  2 0  0  Feeling bad or failure about yourself   1 0  0  Trouble concentrating  0 0  0  Moving slowly or fidgety/restless  0 0  0  Suicidal thoughts  0 0  0  PHQ-9 Score  11 0  3  Difficult doing work/chores   Not difficult at all  Not difficult at all    phq 9 is {gen pos [id number]}   Fall Risk:    08/24/2021    2:03 PM 05/19/2021    8:16 AM 04/20/2021    1:09 PM 12/14/2020    2:12 PM 11/16/2020   10:59 AM  Fall Risk   Falls in the past year? 0 0 0 0 0  Number falls in past yr: 0 0 0  0  Injury with Fall? 0 0 0  0  Risk for fall due to :  No Fall Risks     Follow up Falls evaluation completed Falls prevention discussed   Falls evaluation completed      Functional Status Survey:      Assessment & Plan  *** There are no diagnoses linked to this encounter.

## 2021-09-07 ENCOUNTER — Ambulatory Visit: Payer: Medicare Other | Admitting: Family Medicine

## 2021-09-09 NOTE — Progress Notes (Signed)
Name: Wendy Johnson   MRN: [id number]    DOB: 06-22-1965   Date:09/10/2021 ? ?     Progress Note ? ?Subjective ? ?Chief Complaint ? ?Follow up  ? ?HPI ? ?GERD: she takes Dexilant as prescribed and states her symptoms have been controlled. She states symptoms returns if she is off medication for a couple of days. No longer having regurgitation or heartburn   ? ?HTN: she is taking Diovan 80 mg  and bp is at goal, no side effects . ?  ?Dyslipidemia:  She has high triglycerides , low HDl and high LDL, she has been off medications, reviewed last labs and LDL was 106  ?  ?DDD lumbar spine: she sees pain clinic - Dr Andree Elk, she is on Oxycodone 10 mg QID, denies constipation - controlled with Benifiber, she has narcan at home. She also has OA of knee and shoulders and sees Ortho at [facility] and has steroid injections . She had a revision of right total knee 10/22 , she continues to have daily pain, she has been taking ibuprofen prn  ?  ?Morbid obesity: her maximum weight was around 273 lbs, she lost down to 184 lbs with physical activity and life style modification, she gradually gained weight back, weight now is between 230's and 250's , she states she will start going back to the gym again.  ? ?Bipolar disorder: on multiple medications , given by Dr. Kasandra Knudsen she has follow up with him next week.  She states her mood is stable.  ?  ?COPD : she is now seeing Dr. Raul Del, on Trelegy and states no sob, she states sob and wheezing controlled with medication - symptoms improved  ?  ?Alcohol history: she completely quit Feb 2017 but had a relapse, she has quit again since June 2020. Stable  ? ?Migraine headaches: she states episodes more often lately, she is taking BC's to control symptoms, cannot tolerate imitrex, she has tried topamax in the past but did not work. Discussed rebound headaches. We will try Roselyn Meier and explained must stop nsaid's to make daily headache resolve. Nurtec prn did not work  ? ?Anemia : colonoscopy is up  to date. Advised to get labs rechecked today  ? ?Patient Active Problem List  ? Diagnosis Date Noted  ? Anemia 09/10/2021  ? Left-sided chest wall pain 09/10/2021  ? Boil 08/24/2021  ? S/P revision of total knee, right 02/11/2021  ? Esophageal dysphagia   ? History of colonic polyps   ? Sciatica, right side 02/18/2019  ? Pain in joint, shoulder region 12/19/2018  ? Unspecified inflammatory spondylopathy, lumbar region [facility]) 10/04/2018  ? BMI 40.0-44.9, adult ([place]) 08/17/2018  ? Lumbar spondylosis 03/19/2018  ? Polyarthralgia 03/19/2018  ? History of alcoholism ([place]) 07/19/2017  ? Bilateral carpal tunnel syndrome 05/29/2017  ? Painful total knee replacement, right ([place]) 10/11/2016  ? Rotator cuff tendinitis, right 07/29/2016  ? Occipital neuralgia 10/12/2015  ? Instability of prosthetic knee ([place]) 09/22/2015  ? Primary osteoarthritis of right hip 05/12/2015  ? Sleep apnea 04/07/2015  ? Primary osteoarthritis of left hip 12/16/2014  ? Metabolic syndrome 27/74/1287  ? Migraine without aura and without status migrainosus, not intractable 11/26/2014  ? GERD without esophagitis 11/26/2014  ? COPD, moderate ([place]) 11/26/2014  ? Nocturnal oxygen desaturation 11/26/2014  ? Supplemental oxygen dependent 11/26/2014  ? Hearing loss 11/26/2014  ? Pain syndrome, chronic 11/26/2014  ? History of hypertension 11/26/2014  ? Dyslipidemia 11/26/2014  ? Morbid obesity ([place]) 11/26/2014  ?  Chronic constipation 11/26/2014  ? Generalized anxiety disorder 11/11/2014  ? DDD (degenerative disc disease), lumbar 11/11/2014  ? Facet arthritis of lumbar region 11/11/2014  ? Primary osteoarthritis involving multiple joints 11/11/2014  ? H/O hysterectomy for benign disease 10/20/2014  ? Low back derangement syndrome 09/30/2014  ? Bipolar disorder ([place]) 09/30/2014  ? ? ?Past Surgical History:  ?Procedure Laterality Date  ? ABDOMINAL HYSTERECTOMY N/A 10/20/2014  ? Procedure: Total abdominial hysterectomy, bilateral salpingo-oophorectomy;  Surgeon:  Brayton Mars, MD;  Location: [facility];  Service: Gynecology;  Laterality: N/A;  ? BILATERAL SALPINGOOPHORECTOMY    ? bone spurs removed Bilateral   ? CARPAL TUNNEL RELEASE Left 06/08/2017  ? Procedure: CARPAL TUNNEL RELEASE;  Surgeon: Hessie Knows, MD;  Location: [facility];  Service: Orthopedics;  Laterality: Left;  ? CARPAL TUNNEL RELEASE Right 10/10/2017  ? Procedure: CARPAL TUNNEL RELEASE;  Surgeon: Hessie Knows, MD;  Location: [facility];  Service: Orthopedics;  Laterality: Right;  ? COLONOSCOPY WITH PROPOFOL N/A 09/01/2016  ? Procedure: COLONOSCOPY WITH PROPOFOL;  Surgeon: Jonathon Bellows, MD;  Location: [facility] ENDOSCOPY;  Service: Endoscopy;  Laterality: N/A;  ? COLONOSCOPY WITH PROPOFOL N/A 07/30/2019  ? Procedure: COLONOSCOPY WITH PROPOFOL;  Surgeon: Lucilla Lame, MD;  Location: [facility] ENDOSCOPY;  Service: Endoscopy;  Laterality: N/A;  ? DORSAL COMPARTMENT RELEASE Left 06/08/2017  ? Procedure: RELEASE DORSAL COMPARTMENT (DEQUERVAIN);  Surgeon: Hessie Knows, MD;  Location: [facility];  Service: Orthopedics;  Laterality: Left;  ? ESOPHAGOGASTRODUODENOSCOPY (EGD) WITH PROPOFOL N/A 09/01/2016  ? Procedure: ESOPHAGOGASTRODUODENOSCOPY (EGD) WITH PROPOFOL;  Surgeon: Jonathon Bellows, MD;  Location: [facility] END[facility];  Service: Endoscopy;  Laterality: N/A;  ? ESOPHAGOGASTRODUODENOSCOPY (EGD) WITH PROPOFOL N/A 07/30/2019  ? Procedure: ESOPHAGOGASTRODUODENOSCOPY (EGD) WITH PROPOFOL;  Surgeon: Lucilla Lame, MD;  Location: [facility] ENDOSCOPY;  Service: Endoscopy;  Laterality: N/A;  ? FOOT SURGERY Bilateral   ? INSERTION OF MESH N/A 12/26/2017  ? Procedure: INSERTION OF MESH;  Surgeon: Jules Husbands, MD;  Location: [facility];  Service: General;  Laterality: N/A;  ? JOINT REPLACEMENT Right   ? Total Knee replacement X 2  ? JOINT REPLACEMENT Bilateral   ? Total Hip Replacement  ? knee arthroscopo Right   ? LAPAROSCOPY  09/22/2014  ? Procedure: LAPAROSCOPY OPERATIVE;  Surgeon: Brayton Mars, MD;  Location: [facility];  Service: Gynecology;;   excision and fulgeration of endomertriosis  ? LIPOMA EXCISION    ? ROBOTIC ASSISTED LAPAROSCOPIC VENTRAL/INCISIONAL HERNIA REPAIR N/A 12/26/2017  ? Procedure: ROBOTIC ASSISTED LAPAROSCOPIC VENTRAL/INCISIONAL HERNIA REPAIR;  Surgeon: Jules Husbands, MD;  Location: [facility];  Service: General;  Laterality: N/A;  ? TONSILLECTOMY    ? TOTAL HIP ARTHROPLASTY Left 12/16/2014  ? Procedure: TOTAL HIP ARTHROPLASTY ANTERIOR APPROACH;  Surgeon: Hessie Knows, MD;  Location: [facility];  Service: Orthopedics;  Laterality: Left;  ? TOTAL HIP ARTHROPLASTY Right 05/12/2015  ? Procedure: TOTAL HIP ARTHROPLASTY ANTERIOR APPROACH;  Surgeon: Hessie Knows, MD;  Location: [facility];  Service: Orthopedics;  Laterality: Right;  ? TOTAL KNEE REVISION Right 09/22/2015  ? Procedure: TOTAL KNEE REVISION/ REVISE POLYIETHYLENE;  Surgeon: Hessie Knows, MD;  Location: [facility];  Service: Orthopedics;  Laterality: Right;  ? TOTAL KNEE REVISION Right 10/11/2016  ? Procedure: TOTAL KNEE REVISION;  Surgeon: Hessie Knows, MD;  Location: [facility];  Service: Orthopedics;  Laterality: Right;  ? TOTAL KNEE REVISION Right 02/11/2021  ? Procedure: TOTAL KNEE REVISION;  Surgeon: Hessie Knows, MD;  Location: [facility];  Service: Orthopedics;  Laterality: Right;  ? TUBAL LIGATION    ?  VENTRAL HERNIA REPAIR N/A 11/08/2018  ? Procedure: HERNIA REPAIR VENTRAL ADULT OPEN. DIABETIC, SLEEP APNEA;  Surgeon: Jules Husbands, MD;  Location: [facility];  Service: General;  Laterality: N/A;  ? ? ?Family History  ?Problem Relation Age of Onset  ? Diabetes Mother   ? Heart disease Father   ? Heart attack Father   ? Diabetes Sister   ? Breast cancer Maternal Aunt   ?     <50  ? Breast cancer Paternal Aunt   ?     x2.  <50  ? Healthy Son   ? Drug abuse Sister   ? ? ?Social History  ? ?Tobacco Use  ? Smoking status: Every Day  ?  Packs/day: 0.50  ?  Years: 37.00  ?  Pack years: 18.50  ?  Types: Cigarettes  ?  Start date: 11/26/1979  ? Smokeless tobacco: Never  ?Substance Use Topics  ? Alcohol  use: No  ?  Alcohol/week: 0.0 standard drinks  ?  Comment: but used to be a heavy drinker, quit after DUI 2021  ? ? ? ?Current Outpatient Medications:  ?  albuterol (VENTOLIN HFA) 108 (90 Base) MCG/ACT inh

## 2021-09-10 ENCOUNTER — Encounter: Payer: Self-pay | Admitting: Family Medicine

## 2021-09-10 ENCOUNTER — Ambulatory Visit (INDEPENDENT_AMBULATORY_CARE_PROVIDER_SITE_OTHER): Payer: Medicare Other | Admitting: Family Medicine

## 2021-09-10 VITALS — BP 136/84 | HR 94 | Resp 16 | Ht 64.0 in | Wt 246.0 lb

## 2021-09-10 DIAGNOSIS — I10 Essential (primary) hypertension: Secondary | ICD-10-CM

## 2021-09-10 DIAGNOSIS — F3131 Bipolar disorder, current episode depressed, mild: Secondary | ICD-10-CM

## 2021-09-10 DIAGNOSIS — G894 Chronic pain syndrome: Secondary | ICD-10-CM

## 2021-09-10 DIAGNOSIS — Z23 Encounter for immunization: Secondary | ICD-10-CM

## 2021-09-10 DIAGNOSIS — G4733 Obstructive sleep apnea (adult) (pediatric): Secondary | ICD-10-CM

## 2021-09-10 DIAGNOSIS — K219 Gastro-esophageal reflux disease without esophagitis: Secondary | ICD-10-CM

## 2021-09-10 DIAGNOSIS — Z1159 Encounter for screening for other viral diseases: Secondary | ICD-10-CM

## 2021-09-10 DIAGNOSIS — R519 Headache, unspecified: Secondary | ICD-10-CM

## 2021-09-10 DIAGNOSIS — R0789 Other chest pain: Secondary | ICD-10-CM | POA: Diagnosis not present

## 2021-09-10 DIAGNOSIS — J449 Chronic obstructive pulmonary disease, unspecified: Secondary | ICD-10-CM

## 2021-09-10 DIAGNOSIS — H60541 Acute eczematoid otitis externa, right ear: Secondary | ICD-10-CM

## 2021-09-10 DIAGNOSIS — G43001 Migraine without aura, not intractable, with status migrainosus: Secondary | ICD-10-CM

## 2021-09-10 DIAGNOSIS — F1021 Alcohol dependence, in remission: Secondary | ICD-10-CM

## 2021-09-10 DIAGNOSIS — D649 Anemia, unspecified: Secondary | ICD-10-CM | POA: Diagnosis not present

## 2021-09-10 HISTORY — DX: Anemia, unspecified: D64.9

## 2021-09-10 MED ORDER — DEXLANSOPRAZOLE 60 MG PO CPDR: 1.0000 | DELAYED_RELEASE_CAPSULE | Freq: Every day | ORAL | 1 refills | Status: DC

## 2021-09-10 MED ORDER — VALSARTAN 80 MG PO TABS: 80.0000 mg | ORAL_TABLET | Freq: Every day | ORAL | 1 refills | Status: DC

## 2021-09-10 MED ORDER — LIDOCAINE 5 % EX PTCH: 2.0000 | MEDICATED_PATCH | CUTANEOUS | 0 refills | Status: DC

## 2021-09-10 MED ORDER — SHINGRIX 50 MCG/0.5ML IM SUSR: 0.5000 mL | Freq: Once | INTRAMUSCULAR | 0 refills | Status: AC

## 2021-09-10 MED ORDER — BETAMETHASONE VALERATE 0.1 % EX CREA: TOPICAL_CREAM | Freq: Two times a day (BID) | CUTANEOUS | 0 refills | Status: DC

## 2021-09-10 MED ORDER — UBRELVY 100 MG PO TABS: 1.0000 | ORAL_TABLET | ORAL | 5 refills | Status: DC

## 2021-10-05 ENCOUNTER — Ambulatory Visit
Admission: RE | Admit: 2021-10-05 | Discharge: 2021-10-05 | Disposition: A | Discharge status: Home / Self Care | Payer: Medicare Other | Source: Ambulatory Visit | Attending: Anesthesiology | Admitting: Anesthesiology

## 2021-10-05 ENCOUNTER — Other Ambulatory Visit: Payer: Self-pay | Admitting: Anesthesiology

## 2021-10-05 ENCOUNTER — Encounter: Payer: Self-pay | Admitting: Anesthesiology

## 2021-10-05 ENCOUNTER — Ambulatory Visit (HOSPITAL_BASED_OUTPATIENT_CLINIC_OR_DEPARTMENT_OTHER): Payer: Medicare Other | Admitting: Anesthesiology

## 2021-10-05 VITALS — BP 143/105 | HR 79 | Temp 98.2°F | Resp 15 | Ht 64.0 in | Wt 240.0 lb

## 2021-10-05 DIAGNOSIS — M25552 Pain in left hip: Secondary | ICD-10-CM | POA: Insufficient documentation

## 2021-10-05 DIAGNOSIS — M545 Low back pain, unspecified: Secondary | ICD-10-CM | POA: Insufficient documentation

## 2021-10-05 DIAGNOSIS — M5136 Other intervertebral disc degeneration, lumbar region: Secondary | ICD-10-CM

## 2021-10-05 DIAGNOSIS — M47816 Spondylosis without myelopathy or radiculopathy, lumbar region: Secondary | ICD-10-CM | POA: Diagnosis present

## 2021-10-05 DIAGNOSIS — R52 Pain, unspecified: Secondary | ICD-10-CM

## 2021-10-05 DIAGNOSIS — G894 Chronic pain syndrome: Secondary | ICD-10-CM

## 2021-10-05 DIAGNOSIS — M542 Cervicalgia: Secondary | ICD-10-CM | POA: Diagnosis present

## 2021-10-05 DIAGNOSIS — M5431 Sciatica, right side: Secondary | ICD-10-CM | POA: Diagnosis present

## 2021-10-05 DIAGNOSIS — M25551 Pain in right hip: Secondary | ICD-10-CM | POA: Diagnosis present

## 2021-10-05 DIAGNOSIS — F1190 Opioid use, unspecified, uncomplicated: Secondary | ICD-10-CM | POA: Insufficient documentation

## 2021-10-05 MED ORDER — ROPIVACAINE HCL 2 MG/ML IJ SOLN
10.0000 mL | Freq: Once | INTRAMUSCULAR | Status: AC
Start: 2021-10-05 — End: 2021-10-05
  Administered 2021-10-05: 10 mL via EPIDURAL

## 2021-10-05 MED ORDER — OXYCODONE HCL 10 MG PO TABS: 10.0000 mg | ORAL_TABLET | ORAL | 0 refills | Status: DC

## 2021-10-05 MED ORDER — TRIAMCINOLONE ACETONIDE 40 MG/ML IJ SUSP
INTRAMUSCULAR | Status: AC
  Filled 2021-10-05: qty 1

## 2021-10-05 MED ORDER — IOPAMIDOL (ISOVUE-M 200) INJECTION 41%
20.0000 mL | Freq: Once | INTRAMUSCULAR | Status: DC | PRN
  Administered 2021-10-05: 20 mL

## 2021-10-05 MED ORDER — ROPIVACAINE HCL 2 MG/ML IJ SOLN
INTRAMUSCULAR | Status: AC
  Filled 2021-10-05: qty 20

## 2021-10-05 MED ORDER — SODIUM CHLORIDE (PF) 0.9 % IJ SOLN
INTRAMUSCULAR | Status: AC
  Filled 2021-10-05: qty 10

## 2021-10-05 MED ORDER — TRIAMCINOLONE ACETONIDE 40 MG/ML IJ SUSP
40.0000 mg | Freq: Once | INTRAMUSCULAR | Status: AC
  Administered 2021-10-05: 40 mg

## 2021-10-05 MED ORDER — SODIUM CHLORIDE 0.9% FLUSH
10.0000 mL | Freq: Once | INTRAVENOUS | Status: AC
  Administered 2021-10-05: 10 mL

## 2021-10-05 MED ORDER — OXYCODONE HCL 10 MG PO TABS: 10.0000 mg | ORAL_TABLET | ORAL | 0 refills | Status: DC | PRN

## 2021-10-05 MED ORDER — LIDOCAINE HCL (PF) 1 % IJ SOLN
INTRAMUSCULAR | Status: AC
  Filled 2021-10-05: qty 5

## 2021-10-05 MED ORDER — LIDOCAINE HCL (PF) 1 % IJ SOLN
5.0000 mL | Freq: Once | INTRAMUSCULAR | Status: AC
  Administered 2021-10-05: 5 mL via SUBCUTANEOUS

## 2021-10-05 MED ORDER — OXYCODONE HCL 10 MG PO TABS: 10.0000 mg | ORAL_TABLET | ORAL | 0 refills | Status: AC

## 2021-10-05 NOTE — Addendum Note (Signed)
Addended by: Dewayne Shorter on: 10/05/2021 02:58 PM   Modules accepted: Orders

## 2021-10-05 NOTE — Addendum Note (Signed)
Addended by: Molli Barrows on: 10/05/2021 03:38 PM   Modules accepted: Orders

## 2021-10-05 NOTE — Progress Notes (Signed)
Subjective:  Patient ID: Wendy Johnson, female    DOB: [birth year]  Age: 56 y.o. MRN: [id number]  CC: Hip Pain (right)   Procedure: L5-S1 epidural steroid under fluoroscopic guidance with no sedation  HPI Dell Hurtubise Carothers presents for repeat evaluation.  [name] has periodic epidurals for her low back pain and sciatica.  Presently she is having more right hip pain and right posterior lateral leg pain.  She also has chronic knee pain followed by Dr. Rudene Christians and is status post 8 previous surgeries.  She receives epidurals about every 3 to 4 months and these keep her low back pain and sciatica under reasonably good control.  That in combination with her chronic opioid therapy enables her to stay functional and active.  She reports no side effects with her medications.  No change in lower extremity strength function or bowel or bladder function is noted.  The quality characteristic and distribution of her low back pain and leg pain is stable in nature with no recent changes described.  Otherwise she is in her usual state of health.  Outpatient Medications Prior to Visit  Medication Sig Dispense Refill   albuterol (VENTOLIN HFA) 108 (90 Base) MCG/ACT inhaler Inhale 2 puffs into the lungs every 6 (six) hours as needed for wheezing or shortness of breath. 18 g 3   amphetamine-dextroamphetamine (ADDERALL) 15 MG tablet Take 15 mg by mouth daily in the afternoon.     amphetamine-dextroamphetamine (ADDERALL) 30 MG tablet Take 30 mg by mouth 2 (two) times daily.     Aspirin-Caffeine (BC FAST PAIN RELIEF ARTHRITIS PO) Take 1 packet by mouth 2 (two) times daily as needed (pain).     betamethasone valerate (VALISONE) 0.1 % cream Apply topically 2 (two) times daily. 30 g 0   busPIRone (BUSPAR) 30 MG tablet Take 30 mg by mouth 2 (two) times daily.      Calcium Carb-Cholecalciferol (CALCIUM 600 + D PO) Take 1 tablet by mouth 2 (two) times daily.     clonazePAM (KLONOPIN) 1 MG tablet Take 1-2 mg by mouth 6 (six) times  daily.     cyclobenzaprine (FLEXERIL) 10 MG tablet Take 10 mg by mouth 3 (three) times daily as needed for muscle spasms.     dexlansoprazole (DEXILANT) 60 MG capsule Take 1 capsule (60 mg total) by mouth daily. 90 capsule 1   Fluticasone-Umeclidin-Vilant (TRELEGY ELLIPTA) 100-62.5-25 MCG/ACT AEPB Inhale 1 puff into the lungs daily. 1 each 11   hydrOXYzine (ATARAX) 50 MG tablet Take by mouth.     ibuprofen (ADVIL) 800 MG tablet TAKE 1 TABLET BY MOUTH EVERY 8 HOURS AS NEEDED 90 tablet 0   LATUDA 80 MG TABS tablet Take 80 mg by mouth daily.     lidocaine (LIDODERM) 5 % Place 2 patches onto the skin daily. Remove & Discard patch within 12 hours or as directed by MD 60 patch 0   naloxone (NARCAN) nasal spray 4 mg/0.1 mL As directed for opioid induced respiratory depression 1 each 1   nicotine (NICODERM CQ - DOSED IN MG/24 HOURS) 21 mg/24hr patch Place 1 patch (21 mg total) onto the skin daily. 28 patch 0   Podiatric Products (GOLD BOND FOOT EX) Apply 1 application topically daily.     sertraline (ZOLOFT) 100 MG tablet Take 200 mg by mouth daily.     traZODone (DESYREL) 100 MG tablet Take 400 mg by mouth at bedtime.      Ubrogepant (UBRELVY) 100 MG TABS Take 1 tablet  by mouth every other day. 16 tablet 5   valsartan (DIOVAN) 80 MG tablet Take 1 tablet (80 mg total) by mouth daily. 90 tablet 1   Facility-Administered Medications Prior to Visit  Medication Dose Route Frequency Provider Last Rate Last Admin   ipratropium-albuterol (DUONEB) 0.5-2.5 (3) MG/3ML nebulizer solution 3 mL  3 mL Nebulization Q6H PRN Allyne Gee, MD   3 mL at 06/17/21 1039    Review of Systems CNS: No confusion or sedation Cardiac: No angina or palpitations GI: No abdominal pain or constipation Constitutional: No nausea vomiting fevers or chills  Objective:  BP (!) 143/105   Pulse 79 Comment: nsr  Temp 98.2 F (36.8 C)   Resp 15   Ht '5\' 4"'$  (1.626 m)   Wt 240 lb (108.9 kg)   LMP 09/26/2014 Comment: Total  SpO2  95%   BMI 41.20 kg/m    BP Readings from Last 3 Encounters:  10/05/21 (!) 143/105  09/10/21 136/84  08/24/21 130/80     Wt Readings from Last 3 Encounters:  10/05/21 240 lb (108.9 kg)  09/10/21 246 lb (111.6 kg)  08/24/21 240 lb 9.6 oz (109.1 kg)     Physical Exam Pt is alert and oriented PERRL EOMI HEART IS RRR no murmur or rub LCTA no wheezing or rales MUSCULOSKELETAL reveals some paraspinous muscle tenderness but no overt trigger points.  She does have a positive straight leg raise on both the left and right.  Muscle tone and bulk to the lower extremities is at baseline.  She still has peripatellar knee pain.  She walks with an antalgic gait but her muscle tone and bulk appears to be well preserved.  Labs  Lab Results  Component Value Date   HGBA1C 4.9 11/16/2020   HGBA1C 5.3 06/04/2019   HGBA1C 5.3 10/04/2018   Lab Results  Component Value Date   MICROALBUR 0.2 06/04/2019   LDLCALC 106 (H) 11/16/2020   CREATININE 0.53 02/12/2021    -------------------------------------------------------------------------------------------------------------------- Lab Results  Component Value Date   WBC 9.3 02/12/2021   HGB 11.0 (L) 02/12/2021   HCT 33.6 (L) 02/12/2021   PLT 245 02/12/2021   GLUCOSE 95 02/12/2021   CHOL 175 11/16/2020   TRIG 188 (H) 11/16/2020   HDL 39 (L) 11/16/2020   LDLCALC 106 (H) 11/16/2020   ALT 18 02/03/2021   AST 20 02/03/2021   NA 138 02/12/2021   K 3.2 (L) 02/12/2021   CL 101 02/12/2021   CREATININE 0.53 02/12/2021   BUN <5 (L) 02/12/2021   CO2 30 02/12/2021   TSH 0.769 02/03/2016   INR 0.97 10/04/2016   HGBA1C 4.9 11/16/2020   MICROALBUR 0.2 06/04/2019    --------------------------------------------------------------------------------------------------------------------- DG PAIN CLINIC C-ARM 1-60 MIN NO REPORT  Result Date: 10/05/2021 Fluoro was used, but no Radiologist interpretation will be provided. Please refer to "NOTES" tab for  provider progress note.    Assessment & Plan:   Graycie was seen today for hip pain.  Diagnoses and all orders for this visit:  Chronic pain syndrome  DDD (degenerative disc disease), lumbar  Sciatica, right side  Pain of both hip joints  Chronic, continuous use of opioids  Low back pain at multiple sites  Facet arthritis of lumbar region  Cervicalgia  Pain of right hip joint  Other orders -     triamcinolone acetonide (KENALOG-40) injection 40 mg -     sodium chloride flush (NS) 0.9 % injection 10 mL -     ropivacaine (PF)  2 mg/mL (0.2%) (NAROPIN) injection 10 mL -     lidocaine (PF) (XYLOCAINE) 1 % injection 5 mL -     iopamidol (ISOVUE-M) 41 % intrathecal injection 20 mL -     Oxycodone HCl 10 MG TABS; Take 1 tablet (10 mg total) by mouth every 4 (four) hours. -     Oxycodone HCl 10 MG TABS; Take 1 tablet (10 mg total) by mouth every 4 (four) hours as needed.        ----------------------------------------------------------------------------------------------------------------------  Problem List Items Addressed This Visit       Unprioritized   DDD (degenerative disc disease), lumbar   Relevant Medications   Oxycodone HCl 10 MG TABS   Oxycodone HCl 10 MG TABS (Start on 11/04/2021)   Facet arthritis of lumbar region   Relevant Medications   Oxycodone HCl 10 MG TABS   Oxycodone HCl 10 MG TABS (Start on 11/04/2021)   Sciatica, right side   Other Visit Diagnoses     Chronic pain syndrome    -  Primary   Relevant Medications   triamcinolone acetonide (KENALOG-40) injection 40 mg (Completed)   ropivacaine (PF) 2 mg/mL (0.2%) (NAROPIN) injection 10 mL (Completed)   lidocaine (PF) (XYLOCAINE) 1 % injection 5 mL (Completed)   Oxycodone HCl 10 MG TABS   Oxycodone HCl 10 MG TABS (Start on 11/04/2021)   Pain of both hip joints       Chronic, continuous use of opioids       Low back pain at multiple sites       Relevant Medications   triamcinolone acetonide  (KENALOG-40) injection 40 mg (Completed)   Oxycodone HCl 10 MG TABS   Oxycodone HCl 10 MG TABS (Start on 11/04/2021)   Cervicalgia       Pain of right hip joint             ----------------------------------------------------------------------------------------------------------------------  1. Chronic pain syndrome I have reviewed the [facility] practitioner database information is appropriate for refill of her medication.  She does well with her chronic opioid she reports.  No side effects reported.  She denies any diverting or illicit use and does get good functional improvement with her current dosing regimen.  She is scheduled for refill on May 30 and June 29.  No other changes in her medication regimen will be initiated today.  2. DDD (degenerative disc disease), lumbar As above and we will proceed with a repeat epidural injection to see if we can help with her low back pain and sciatica.  She has done well with these in the past.  We will schedule her for a 70-monthreturn to clinic.  The risks and benefits were reviewed and all questions were answered.  3. Sciatica, right side As above  4. Pain of both hip joints As above and continue follow-up with orthopedics  5. Chronic, continuous use of opioids   6. Low back pain at multiple sites   7. Facet arthritis of lumbar region   8. Cervicalgia   9. Pain of right hip joint As above and continue follow-up with her primary care physicians for baseline medical care.    ----------------------------------------------------------------------------------------------------------------------  I am having PNobie Putnam Lantigua "[name]" start on Oxycodone HCl and Oxycodone HCl. I am also having her maintain her amphetamine-dextroamphetamine, busPIRone, sertraline, clonazePAM, traZODone, amphetamine-dextroamphetamine, cyclobenzaprine, Latuda, naloxone, Calcium Carb-Cholecalciferol (CALCIUM 600 + D PO), Aspirin-Caffeine (BC FAST PAIN  RELIEF ARTHRITIS PO), Podiatric Products (GOLD BOND FOOT EX), hydrOXYzine, nicotine, ibuprofen, albuterol, Trelegy  Ellipta, dexlansoprazole, valsartan, Ubrelvy, lidocaine, and betamethasone valerate. We administered triamcinolone acetonide, sodium chloride flush, ropivacaine (PF) 2 mg/mL (0.2%), lidocaine (PF), and iopamidol. We will continue to administer ipratropium-albuterol.   Meds ordered this encounter  Medications   triamcinolone acetonide (KENALOG-40) injection 40 mg   sodium chloride flush (NS) 0.9 % injection 10 mL   ropivacaine (PF) 2 mg/mL (0.2%) (NAROPIN) injection 10 mL   lidocaine (PF) (XYLOCAINE) 1 % injection 5 mL   iopamidol (ISOVUE-M) 41 % intrathecal injection 20 mL   Oxycodone HCl 10 MG TABS    Sig: Take 1 tablet (10 mg total) by mouth every 4 (four) hours.    Dispense:  150 tablet    Refill:  0   Oxycodone HCl 10 MG TABS    Sig: Take 1 tablet (10 mg total) by mouth every 4 (four) hours as needed.    Dispense:  150 tablet    Refill:  0   Patient's Medications  New Prescriptions   OXYCODONE HCL 10 MG TABS    Take 1 tablet (10 mg total) by mouth every 4 (four) hours.   OXYCODONE HCL 10 MG TABS    Take 1 tablet (10 mg total) by mouth every 4 (four) hours as needed.  Previous Medications   ALBUTEROL (VENTOLIN HFA) 108 (90 BASE) MCG/ACT INHALER    Inhale 2 puffs into the lungs every 6 (six) hours as needed for wheezing or shortness of breath.   AMPHETAMINE-DEXTROAMPHETAMINE (ADDERALL) 15 MG TABLET    Take 15 mg by mouth daily in the afternoon.   AMPHETAMINE-DEXTROAMPHETAMINE (ADDERALL) 30 MG TABLET    Take 30 mg by mouth 2 (two) times daily.   ASPIRIN-CAFFEINE (BC FAST PAIN RELIEF ARTHRITIS PO)    Take 1 packet by mouth 2 (two) times daily as needed (pain).   BETAMETHASONE VALERATE (VALISONE) 0.1 % CREAM    Apply topically 2 (two) times daily.   BUSPIRONE (BUSPAR) 30 MG TABLET    Take 30 mg by mouth 2 (two) times daily.    CALCIUM CARB-CHOLECALCIFEROL (CALCIUM 600 + D  PO)    Take 1 tablet by mouth 2 (two) times daily.   CLONAZEPAM (KLONOPIN) 1 MG TABLET    Take 1-2 mg by mouth 6 (six) times daily.   CYCLOBENZAPRINE (FLEXERIL) 10 MG TABLET    Take 10 mg by mouth 3 (three) times daily as needed for muscle spasms.   DEXLANSOPRAZOLE (DEXILANT) 60 MG CAPSULE    Take 1 capsule (60 mg total) by mouth daily.   FLUTICASONE-UMECLIDIN-VILANT (TRELEGY ELLIPTA) 100-62.5-25 MCG/ACT AEPB    Inhale 1 puff into the lungs daily.   HYDROXYZINE (ATARAX) 50 MG TABLET    Take by mouth.   IBUPROFEN (ADVIL) 800 MG TABLET    TAKE 1 TABLET BY MOUTH EVERY 8 HOURS AS NEEDED   LATUDA 80 MG TABS TABLET    Take 80 mg by mouth daily.   LIDOCAINE (LIDODERM) 5 %    Place 2 patches onto the skin daily. Remove & Discard patch within 12 hours or as directed by MD   NALOXONE (NARCAN) NASAL SPRAY 4 MG/0.1 ML    As directed for opioid induced respiratory depression   NICOTINE (NICODERM CQ - DOSED IN MG/24 HOURS) 21 MG/24HR PATCH    Place 1 patch (21 mg total) onto the skin daily.   PODIATRIC PRODUCTS (GOLD BOND FOOT EX)    Apply 1 application topically daily.   SERTRALINE (ZOLOFT) 100 MG TABLET    Take 200  mg by mouth daily.   TRAZODONE (DESYREL) 100 MG TABLET    Take 400 mg by mouth at bedtime.    UBROGEPANT (UBRELVY) 100 MG TABS    Take 1 tablet by mouth every other day.   VALSARTAN (DIOVAN) 80 MG TABLET    Take 1 tablet (80 mg total) by mouth daily.  Modified Medications   No medications on file  Discontinued Medications   No medications on file   ----------------------------------------------------------------------------------------------------------------------  Follow-up: Return in about 2 months (around 12/05/2021) for evaluation, med refill.   Procedure: L5-S1 LESI with fluoroscopic guidance and no moderate sedation  NOTE: The risks, benefits, and expectations of the procedure have been discussed and explained to the patient who was understanding and in agreement with suggested  treatment plan. No guarantees were made.  DESCRIPTION OF PROCEDURE: Lumbar epidural steroid injection with no IV Versed, EKG, blood pressure, pulse, and pulse oximetry monitoring. The procedure was performed with the patient in the prone position under fluoroscopic guidance.  Sterile prep x3 was initiated and I then injected subcutaneous lidocaine to the overlying L5-S1 site after its fluoroscopic identifictation.  Using strict aseptic technique, I then advanced an 18-gauge Tuohy epidural needle in the midline using interlaminar approach via loss-of-resistance to saline technique. There was negative aspiration for heme or  CSF.  I then confirmed position with both AP and Lateral fluoroscan.  2 cc of contrast dye were injected and a  total of 5 mL of Preservative-Free normal saline mixed with 40 mg of Kenalog and 1cc Ropicaine 0.2 percent were injected incrementally via the  epidurally placed needle. The needle was removed. The patient tolerated the injection well and was convalesced and discharged to home in stable condition. Should the patient have any post procedure difficulty they have been instructed on how to contact us for assistance.    Molli Barrows, MD

## 2021-10-05 NOTE — Progress Notes (Signed)
Nursing Pain Medication Assessment:  Safety precautions to be maintained throughout the outpatient stay will include: orient to surroundings, keep bed in low position, maintain call bell within reach at all times, provide assistance with transfer out of bed and ambulation.  Medication Inspection Compliance: Pill count conducted under aseptic conditions, in front of the patient. Neither the pills nor the bottle was removed from the patient's sight at any time. Once count was completed pills were immediately returned to the patient in their original bottle.  Medication: Oxycodone IR Pill/Patch Count:  0 of 150 pills remain Pill/Patch Appearance: Markings consistent with prescribed medication Bottle Appearance: Standard pharmacy container. Clearly labeled. Filled Date: 04 / 29 / 2023 Last Medication intake:  Yesterday

## 2021-10-06 ENCOUNTER — Telehealth: Payer: Self-pay

## 2021-10-06 NOTE — Telephone Encounter (Signed)
Post procedure phone call.  Patient states she is doing OK 

## 2021-10-09 LAB — TOXASSURE SELECT 13 (MW), URINE

## 2021-10-13 ENCOUNTER — Other Ambulatory Visit: Payer: Self-pay | Admitting: Family Medicine

## 2021-10-13 DIAGNOSIS — J441 Chronic obstructive pulmonary disease with (acute) exacerbation: Secondary | ICD-10-CM

## 2021-10-13 DIAGNOSIS — J069 Acute upper respiratory infection, unspecified: Secondary | ICD-10-CM

## 2021-10-26 ENCOUNTER — Other Ambulatory Visit: Payer: Self-pay | Admitting: Family Medicine

## 2021-10-26 DIAGNOSIS — G894 Chronic pain syndrome: Secondary | ICD-10-CM

## 2021-11-01 ENCOUNTER — Telehealth: Payer: Self-pay

## 2021-11-01 NOTE — Telephone Encounter (Signed)
[facility]'t have [facility] refill for her meds. She thinks it may be because last time we sent to [facility] because [facility] was out. She states neither pharmacy has her refills.

## 2021-11-18 ENCOUNTER — Encounter: Payer: Self-pay | Admitting: Anesthesiology

## 2021-11-18 MED ORDER — OXYCODONE HCL 10 MG PO TABS: 10.0000 mg | ORAL_TABLET | ORAL | 0 refills | Status: AC | PRN

## 2021-11-18 NOTE — Progress Notes (Signed)
Virtual Visit via Telephone Note  I connected with Wendy Johnson on 11/18/21 at  9:20 AM EDT by telephone and verified that I am speaking with the correct person using two identifiers.  Location: Patient: Home Provider: Pain control center   I discussed the limitations, risks, security and privacy concerns of performing an evaluation and management service by telephone and the availability of in person appointments. I also discussed with the patient that there may be a patient responsible charge related to this service. The patient expressed understanding and agreed to proceed.   History of Present Illness: I was able to reach [facility] via telephone.  We were unable like for the video portion of the conference.  She states that she did well with her last epidural but she is having some recurrence of the same quality characteristic pain as before.  Despite efforts at taking her medications as prescribed the pain has recurred and this is similar to what she is experienced in the past. Her last epidural was in May and she generally gets injections about every 3 to 4 months.  She would like to come in for repeat injection.  Otherwise she is doing her stretching strengthening exercises and trying to lose weight with limited success.  She is trying to stay active and the medications do help with the pain but this pain has been quite oppressive and limits her activity.  She takes her oxycodone about every 4-5 hours and generally gets about 50 to 70% improvement before she gets recurrence of a debilitating low back pain with radiation into the right greater than left leg.  Unfortunately nothing else more conservative has worked for her.  No change in bowel or bladder function or lower extremity strength or function is noted.  Review of systems: General: No fevers or chills Pulmonary: No shortness of breath or dyspnea Cardiac: No angina or palpitations or lightheadedness GI: No abdominal pain or  constipation Psych: No depression  Observations/Objective:  Current Outpatient Medications:    [START ON 01/03/2022] Oxycodone HCl 10 MG TABS, Take 1 tablet (10 mg total) by mouth every 4 (four) hours as needed., Disp: 150 tablet, Rfl: 0   albuterol (VENTOLIN HFA) 108 (90 Base) MCG/ACT inhaler, INHALE 2 PUFFS INTO THE LUNGS EVERY 6 HOURS AS NEEDED FOR WHEEZING OR SHORTNESS OF BREATH, Disp: 8.5 g, Rfl: 0   amphetamine-dextroamphetamine (ADDERALL) 15 MG tablet, Take 15 mg by mouth daily in the afternoon., Disp: , Rfl:    amphetamine-dextroamphetamine (ADDERALL) 30 MG tablet, Take 30 mg by mouth 2 (two) times daily., Disp: , Rfl:    Aspirin-Caffeine (BC FAST PAIN RELIEF ARTHRITIS PO), Take 1 packet by mouth 2 (two) times daily as needed (pain)., Disp: , Rfl:    betamethasone valerate (VALISONE) 0.1 % cream, Apply topically 2 (two) times daily., Disp: 30 g, Rfl: 0   busPIRone (BUSPAR) 30 MG tablet, Take 30 mg by mouth 2 (two) times daily. , Disp: , Rfl:    Calcium Carb-Cholecalciferol (CALCIUM 600 + D PO), Take 1 tablet by mouth 2 (two) times daily., Disp: , Rfl:    clonazePAM (KLONOPIN) 1 MG tablet, Take 1-2 mg by mouth 6 (six) times daily., Disp: , Rfl:    cyclobenzaprine (FLEXERIL) 10 MG tablet, Take 10 mg by mouth 3 (three) times daily as needed for muscle spasms., Disp: , Rfl:    dexlansoprazole (DEXILANT) 60 MG capsule, Take 1 capsule (60 mg total) by mouth daily., Disp: 90 capsule, Rfl: 1  Fluticasone-Umeclidin-Vilant (TRELEGY ELLIPTA) 100-62.5-25 MCG/ACT AEPB, Inhale 1 puff into the lungs daily., Disp: 1 each, Rfl: 11   hydrOXYzine (ATARAX) 50 MG tablet, Take by mouth., Disp: , Rfl:    ibuprofen (ADVIL) 800 MG tablet, TAKE 1 TABLET BY MOUTH EVERY 8 HOURS AS NEEDED, Disp: 90 tablet, Rfl: 0   LATUDA 80 MG TABS tablet, Take 80 mg by mouth daily., Disp: , Rfl:    lidocaine (LIDODERM) 5 %, Place 2 patches onto the skin daily. Remove & Discard patch within 12 hours or as directed by MD, Disp: 60  patch, Rfl: 0   naloxone (NARCAN) nasal spray 4 mg/0.1 mL, As directed for opioid induced respiratory depression, Disp: 1 each, Rfl: 1   nicotine (NICODERM CQ - DOSED IN MG/24 HOURS) 21 mg/24hr patch, Place 1 patch (21 mg total) onto the skin daily., Disp: 28 patch, Rfl: 0   [START ON 12/04/2021] Oxycodone HCl 10 MG TABS, Take 1 tablet (10 mg total) by mouth every 4 (four) hours as needed., Disp: 150 tablet, Rfl: 0   Podiatric Products (GOLD BOND FOOT EX), Apply 1 application topically daily., Disp: , Rfl:    sertraline (ZOLOFT) 100 MG tablet, Take 200 mg by mouth daily., Disp: , Rfl:    traZODone (DESYREL) 100 MG tablet, Take 400 mg by mouth at bedtime. , Disp: , Rfl:    Ubrogepant (UBRELVY) 100 MG TABS, Take 1 tablet by mouth every other day., Disp: 16 tablet, Rfl: 5   valsartan (DIOVAN) 80 MG tablet, Take 1 tablet (80 mg total) by mouth daily., Disp: 90 tablet, Rfl: 1  Current Facility-Administered Medications:    ipratropium-albuterol (DUONEB) 0.5-2.5 (3) MG/3ML nebulizer solution 3 mL, 3 mL, Nebulization, Q6H PRN, Allyne Gee, MD, 3 mL at 06/17/21 1039   Past Medical History:  Diagnosis Date   ADHD (attention deficit hyperactivity disorder)    Anemia 09/10/2021   Anxiety    AR (allergic rhinitis)    Arthritis    Benign hypertension    NO MEDS   Bipolar disorder (HCC)    Chronic back pain    Chronic constipation    Chronic insomnia    COPD (chronic obstructive pulmonary disease) (HCC)    Deaf    RIGHT EAR   Decreased dorsalis pedis pulse    Depression    Dyslipidemia    Fatty liver    GERD (gastroesophageal reflux disease)    Hepatomegaly    High cholesterol    Hot flashes    Migraine with aura    OCD (obsessive compulsive disorder)    Pain in joint, shoulder region 12/19/2018   Plantar warts    Sciatica, right side 02/18/2019   Severe obesity (HCC)    Shortness of breath dyspnea    Sleep apnea    NO CPAP   Tobacco use    Trochanteric bursitis of right hip       Assessment and Plan: . 1. DDD (degenerative disc disease), lumbar   2. Sciatica, right side   3. Chronic pain syndrome   4. Pain of both hip joints   5. Chronic, continuous use of opioids   6. Low back pain at multiple sites   7. Cervicalgia   8. Pain of right hip joint   9. Pain due to total right knee replacement, sequela   10. Low back derangement syndrome   11. Facet arthritis of lumbar region   12. Pain in joint of right shoulder   Based on our discussion today  I think it is reasonable to have her return to clinic for a repeat epidural.  These seem to be working well for her and they generally give her about 50 to 70% relief for about 2 months before she has recurrence of her same usual low back pain with right leg pain.  No other changes are noted.  Strength and lower extremity function is stable with no change in bowel or bladder function.  I have reviewed the [facility] practitioner database information and is appropriate for refill.  Unfortunately she has failed more conservative therapy but chronic opioid management has been effective for her.  She denies any diverting or illicit use and does admit to overall improved lifestyle function better sleep and pain control.  We will schedule this for approximately 1 to 2 months and I have encouraged her to continue follow-up with her primary care physicians for baseline medical care. Follow Up Instructions:    I discussed the assessment and treatment plan with the patient. The patient was provided an opportunity to ask questions and all were answered. The patient agreed with the plan and demonstrated an understanding of the instructions.   The patient was advised to call back or seek an in-person evaluation if the symptoms worsen or if the condition fails to improve as anticipated.  I provided 30 minutes of non-face-to-face time during this encounter.   Molli Barrows, MD

## 2021-11-19 ENCOUNTER — Ambulatory Visit: Payer: Medicare Other | Attending: Anesthesiology | Admitting: Anesthesiology

## 2021-11-19 DIAGNOSIS — M25511 Pain in right shoulder: Secondary | ICD-10-CM

## 2021-11-19 DIAGNOSIS — M25552 Pain in left hip: Secondary | ICD-10-CM

## 2021-11-19 DIAGNOSIS — M5431 Sciatica, right side: Secondary | ICD-10-CM

## 2021-11-19 DIAGNOSIS — G894 Chronic pain syndrome: Secondary | ICD-10-CM | POA: Diagnosis not present

## 2021-11-19 DIAGNOSIS — T8484XS Pain due to internal orthopedic prosthetic devices, implants and grafts, sequela: Secondary | ICD-10-CM

## 2021-11-19 DIAGNOSIS — M5386 Other specified dorsopathies, lumbar region: Secondary | ICD-10-CM

## 2021-11-19 DIAGNOSIS — M5136 Other intervertebral disc degeneration, lumbar region: Secondary | ICD-10-CM | POA: Diagnosis not present

## 2021-11-19 DIAGNOSIS — M25551 Pain in right hip: Secondary | ICD-10-CM

## 2021-11-19 DIAGNOSIS — Z96651 Presence of right artificial knee joint: Secondary | ICD-10-CM

## 2021-11-19 DIAGNOSIS — F1190 Opioid use, unspecified, uncomplicated: Secondary | ICD-10-CM

## 2021-11-19 DIAGNOSIS — M47816 Spondylosis without myelopathy or radiculopathy, lumbar region: Secondary | ICD-10-CM

## 2021-11-19 DIAGNOSIS — M545 Low back pain, unspecified: Secondary | ICD-10-CM

## 2021-11-19 DIAGNOSIS — M542 Cervicalgia: Secondary | ICD-10-CM

## 2021-12-03 ENCOUNTER — Other Ambulatory Visit: Payer: Self-pay | Admitting: Family Medicine

## 2021-12-03 ENCOUNTER — Other Ambulatory Visit: Payer: Self-pay | Admitting: Nurse Practitioner

## 2021-12-03 DIAGNOSIS — J069 Acute upper respiratory infection, unspecified: Secondary | ICD-10-CM

## 2021-12-03 DIAGNOSIS — G894 Chronic pain syndrome: Secondary | ICD-10-CM

## 2021-12-03 DIAGNOSIS — J441 Chronic obstructive pulmonary disease with (acute) exacerbation: Secondary | ICD-10-CM

## 2021-12-03 NOTE — Telephone Encounter (Signed)
Requested Prescriptions  Pending Prescriptions Disp Refills  . albuterol (VENTOLIN HFA) 108 (90 Base) MCG/ACT inhaler [Pharmacy Med Name: ALBUTEROL SULFATE HFA 108 (90 BASE)] 8.5 g 0    Sig: INHALE 2 PUFFS INTO THE LUNGS EVERY 6 HOURS AS NEEDED FOR WHEEZING OR SHORTNESS OF BREATH     Pulmonology:  Beta Agonists 2 Failed - 12/03/2021 11:34 AM      Failed - Last BP in normal range    BP Readings from Last 1 Encounters:  10/05/21 (!) 143/105         Passed - Last Heart Rate in normal range    Pulse Readings from Last 1 Encounters:  10/05/21 79         Passed - Valid encounter within last 12 months    Recent Outpatient Visits          2 months ago Left-sided chest wall pain   [facility] Steele Sizer, MD   3 months ago Encounter for screening mammogram for malignant neoplasm of breast   [place], DO   6 months ago COPD, moderate [facility])   [facility] [place]izer, MD   7 months ago COPD exacerbation [facility])   [facility] Teodora Medici, DO   1 year ago RUQ pain   [facility] [place] Sizer, MD

## 2022-02-03 ENCOUNTER — Ambulatory Visit (HOSPITAL_BASED_OUTPATIENT_CLINIC_OR_DEPARTMENT_OTHER): Payer: Medicare Other | Admitting: Anesthesiology

## 2022-02-03 ENCOUNTER — Encounter: Payer: Self-pay | Admitting: Anesthesiology

## 2022-02-03 ENCOUNTER — Other Ambulatory Visit: Payer: Self-pay | Admitting: Anesthesiology

## 2022-02-03 ENCOUNTER — Ambulatory Visit
Admission: RE | Admit: 2022-02-03 | Discharge: 2022-02-03 | Disposition: A | Discharge status: Home / Self Care | Payer: Medicare Other | Source: Ambulatory Visit | Attending: Anesthesiology | Admitting: Anesthesiology

## 2022-02-03 VITALS — BP 158/92 | HR 95 | Temp 97.0°F | Resp 22 | Ht 64.0 in | Wt 240.0 lb

## 2022-02-03 DIAGNOSIS — G894 Chronic pain syndrome: Secondary | ICD-10-CM

## 2022-02-03 DIAGNOSIS — M5136 Other intervertebral disc degeneration, lumbar region: Secondary | ICD-10-CM | POA: Diagnosis present

## 2022-02-03 DIAGNOSIS — M5386 Other specified dorsopathies, lumbar region: Secondary | ICD-10-CM | POA: Diagnosis not present

## 2022-02-03 DIAGNOSIS — M5431 Sciatica, right side: Secondary | ICD-10-CM | POA: Insufficient documentation

## 2022-02-03 DIAGNOSIS — M25551 Pain in right hip: Secondary | ICD-10-CM | POA: Insufficient documentation

## 2022-02-03 DIAGNOSIS — Z79891 Long term (current) use of opiate analgesic: Secondary | ICD-10-CM | POA: Insufficient documentation

## 2022-02-03 DIAGNOSIS — T8484XS Pain due to internal orthopedic prosthetic devices, implants and grafts, sequela: Secondary | ICD-10-CM | POA: Insufficient documentation

## 2022-02-03 DIAGNOSIS — R52 Pain, unspecified: Secondary | ICD-10-CM | POA: Diagnosis present

## 2022-02-03 DIAGNOSIS — Z96651 Presence of right artificial knee joint: Secondary | ICD-10-CM | POA: Insufficient documentation

## 2022-02-03 DIAGNOSIS — M542 Cervicalgia: Secondary | ICD-10-CM | POA: Insufficient documentation

## 2022-02-03 DIAGNOSIS — M47816 Spondylosis without myelopathy or radiculopathy, lumbar region: Secondary | ICD-10-CM

## 2022-02-03 DIAGNOSIS — F1190 Opioid use, unspecified, uncomplicated: Secondary | ICD-10-CM | POA: Insufficient documentation

## 2022-02-03 DIAGNOSIS — M25552 Pain in left hip: Secondary | ICD-10-CM | POA: Insufficient documentation

## 2022-02-03 DIAGNOSIS — M51369 Other intervertebral disc degeneration, lumbar region without mention of lumbar back pain or lower extremity pain: Secondary | ICD-10-CM

## 2022-02-03 DIAGNOSIS — M545 Low back pain, unspecified: Secondary | ICD-10-CM

## 2022-02-03 DIAGNOSIS — M5441 Lumbago with sciatica, right side: Secondary | ICD-10-CM | POA: Insufficient documentation

## 2022-02-03 DIAGNOSIS — M47819 Spondylosis without myelopathy or radiculopathy, site unspecified: Secondary | ICD-10-CM | POA: Diagnosis not present

## 2022-02-03 MED ORDER — SODIUM CHLORIDE 0.9% FLUSH
10.0000 mL | Freq: Once | INTRAVENOUS | Status: AC
  Administered 2022-02-03: 10 mL

## 2022-02-03 MED ORDER — OXYCODONE HCL 10 MG PO TABS: 10.0000 mg | ORAL_TABLET | ORAL | 0 refills | Status: AC | PRN

## 2022-02-03 MED ORDER — LIDOCAINE HCL (PF) 1 % IJ SOLN: 5.0000 mL | Freq: Once | INTRAMUSCULAR | Status: DC

## 2022-02-03 MED ORDER — TRIAMCINOLONE ACETONIDE 40 MG/ML IJ SUSP
INTRAMUSCULAR | Status: AC
  Filled 2022-02-03: qty 1

## 2022-02-03 MED ORDER — IOHEXOL 180 MG/ML  SOLN
INTRAMUSCULAR | Status: AC
  Filled 2022-02-03: qty 20

## 2022-02-03 MED ORDER — IOHEXOL 180 MG/ML  SOLN
10.0000 mL | Freq: Once | INTRAMUSCULAR | Status: AC | PRN
  Administered 2022-02-03: 10 mL via EPIDURAL

## 2022-02-03 MED ORDER — LIDOCAINE HCL (PF) 1 % IJ SOLN
INTRAMUSCULAR | Status: AC
  Filled 2022-02-03: qty 10

## 2022-02-03 MED ORDER — ROPIVACAINE HCL 2 MG/ML IJ SOLN
INTRAMUSCULAR | Status: AC
  Filled 2022-02-03: qty 20

## 2022-02-03 MED ORDER — SODIUM CHLORIDE (PF) 0.9 % IJ SOLN
INTRAMUSCULAR | Status: AC
  Filled 2022-02-03: qty 10

## 2022-02-03 MED ORDER — TRIAMCINOLONE ACETONIDE 40 MG/ML IJ SUSP
40.0000 mg | Freq: Once | INTRAMUSCULAR | Status: AC
  Administered 2022-02-03: 40 mg

## 2022-02-03 MED ORDER — ROPIVACAINE HCL 2 MG/ML IJ SOLN
10.0000 mL | Freq: Once | INTRAMUSCULAR | Status: AC
  Administered 2022-02-03: 10 mL via EPIDURAL

## 2022-02-03 NOTE — Progress Notes (Signed)
Nursing Pain Medication Assessment:  Safety precautions to be maintained throughout the outpatient stay will include: orient to surroundings, keep bed in low position, maintain call bell within reach at all times, provide assistance with transfer out of bed and ambulation.  Medication Inspection Compliance: Wendy Johnson did not comply with our request to bring her pills to be counted. She was reminded that bringing the medication bottles, even when empty, is a requirement.  Medication: None brought in. Pill/Patch Count: None available to be counted. Bottle Appearance: No container available. Did not bring bottle(s) to appointment. Filled Date: N/A Last Medication intake:  Yesterday

## 2022-02-03 NOTE — Patient Instructions (Signed)

## 2022-02-04 NOTE — Progress Notes (Signed)
Subjective:  Patient ID: Wendy Johnson, female    DOB: Jun 17, 1965  Age: 56 y.o. MRN: [id number]  CC: Back Pain and Hip Pain (right)   Procedure: L5-S1 epidural steroid and fluoroscopic guidance with no sedation  HPI Wendy Johnson presents for reevaluation.  Wendy Johnson continues to have low back pain with radiation into the right hip and right lower leg.  She has had epidural steroid injections in the past with good success for this pain which is recalcitrant in nature.  Despite physical therapy and previous efforts at conservative care of the pain persist and she presents today desiring to proceed with a repeat epidural.  Her last epidural in May was successful giving her about 7080% improvement in her low back pain for about 4 to 6 weeks and 2 months of relief for the sciatica symptoms.  The hip pain also appears to have improved transiently.  Otherwise she takes her medications for pain management averaging every 4 hour dosing for her pain medications.  She is somewhat desensitized to opioid medications as she she has been on them chronically.  She denies any diverting or illicit use and has been compliant with the regimen and describes improved overall functional capacity and ability to do tasks of daily living with the medications where she is unable to do this without.  She is also sleeps better at night.  Outpatient Medications Prior to Visit  Medication Sig Dispense Refill   albuterol (VENTOLIN HFA) 108 (90 Base) MCG/ACT inhaler INHALE 2 PUFFS INTO THE LUNGS EVERY 6 HOURS AS NEEDED FOR WHEEZING OR SHORTNESS OF BREATH 8.5 g 0   amphetamine-dextroamphetamine (ADDERALL) 15 MG tablet Take 15 mg by mouth daily in the afternoon.     amphetamine-dextroamphetamine (ADDERALL) 30 MG tablet Take 30 mg by mouth 2 (two) times daily.     Aspirin-Caffeine (BC FAST PAIN RELIEF ARTHRITIS PO) Take 1 packet by mouth 2 (two) times daily as needed (pain).     betamethasone valerate (VALISONE) 0.1 % cream Apply  topically 2 (two) times daily. 30 g 0   busPIRone (BUSPAR) 30 MG tablet Take 30 mg by mouth 2 (two) times daily.      Calcium Carb-Cholecalciferol (CALCIUM 600 + D PO) Take 1 tablet by mouth 2 (two) times daily.     clonazePAM (KLONOPIN) 1 MG tablet Take 1-2 mg by mouth 6 (six) times daily.     cyclobenzaprine (FLEXERIL) 10 MG tablet Take 10 mg by mouth 3 (three) times daily as needed for muscle spasms.     dexlansoprazole (DEXILANT) 60 MG capsule Take 1 capsule (60 mg total) by mouth daily. 90 capsule 1   Fluticasone-Umeclidin-Vilant (TRELEGY ELLIPTA) 100-62.5-25 MCG/ACT AEPB Inhale 1 puff into the lungs daily. 1 each 11   hydrOXYzine (ATARAX) 50 MG tablet Take by mouth.     ibuprofen (ADVIL) 800 MG tablet TAKE 1 TABLET BY MOUTH EVERY 8 HOURS AS NEEDED 90 tablet 0   LATUDA 80 MG TABS tablet Take 80 mg by mouth daily.     lidocaine (LIDODERM) 5 % Place 2 patches onto the skin daily. Remove & Discard patch within 12 hours or as directed by MD 60 patch 0   naloxone (NARCAN) nasal spray 4 mg/0.1 mL As directed for opioid induced respiratory depression 1 each 1   nicotine (NICODERM CQ - DOSED IN MG/24 HOURS) 21 mg/24hr patch Place 1 patch (21 mg total) onto the skin daily. 28 patch 0   Podiatric Products (GOLD BOND FOOT EX)  Apply 1 application topically daily.     sertraline (ZOLOFT) 100 MG tablet Take 200 mg by mouth daily.     traZODone (DESYREL) 100 MG tablet Take 400 mg by mouth at bedtime.      Ubrogepant (UBRELVY) 100 MG TABS Take 1 tablet by mouth every other day. 16 tablet 5   valsartan (DIOVAN) 80 MG tablet Take 1 tablet (80 mg total) by mouth daily. 90 tablet 1   Facility-Administered Medications Prior to Visit  Medication Dose Route Frequency Provider Last Rate Last Admin   ipratropium-albuterol (DUONEB) 0.5-2.5 (3) MG/3ML nebulizer solution 3 mL  3 mL Nebulization Q6H PRN Allyne Gee, MD   3 mL at 06/17/21 1039    Review of Systems CNS: No confusion or sedation Cardiac: No angina  or palpitations GI: No abdominal pain or constipation Constitutional: No nausea vomiting fevers or chills  Objective:  BP (!) 158/92   Pulse 95   Temp (!) 97 F (36.1 C)   Resp (!) 22   Ht '5\' 4"'$  (1.626 m)   Wt 240 lb (108.9 kg)   LMP 09/26/2014 Comment: Total  SpO2 96%   BMI 41.20 kg/m    BP Readings from Last 3 Encounters:  02/03/22 (!) 158/92  10/05/21 (!) 143/105  09/10/21 136/84     Wt Readings from Last 3 Encounters:  02/03/22 240 lb (108.9 kg)  10/05/21 240 lb (108.9 kg)  09/10/21 246 lb (111.6 kg)     Physical Exam Pt is alert and oriented PERRL EOMI HEART IS RRR no murmur or rub LCTA no wheezing or rales MUSCULOSKELETAL reveals some paraspinous muscle muscle tenderness but no overt trigger points.  She does have a positive straight leg raise on the right side negative on the left.  Labs  Lab Results  Component Value Date   HGBA1C 4.9 11/16/2020   HGBA1C 5.3 06/04/2019   HGBA1C 5.3 10/04/2018   Lab Results  Component Value Date   MICROALBUR 0.2 06/04/2019   LDLCALC 106 (H) 11/16/2020   CREATININE 0.53 02/12/2021    -------------------------------------------------------------------------------------------------------------------- Lab Results  Component Value Date   WBC 9.3 02/12/2021   HGB 11.0 (L) 02/12/2021   HCT 33.6 (L) 02/12/2021   PLT 245 02/12/2021   GLUCOSE 95 02/12/2021   CHOL 175 11/16/2020   TRIG 188 (H) 11/16/2020   HDL 39 (L) 11/16/2020   LDLCALC 106 (H) 11/16/2020   ALT 18 02/03/2021   AST 20 02/03/2021   NA 138 02/12/2021   K 3.2 (L) 02/12/2021   CL 101 02/12/2021   CREATININE 0.53 02/12/2021   BUN <5 (L) 02/12/2021   CO2 30 02/12/2021   TSH 0.769 02/03/2016   INR 0.97 10/04/2016   HGBA1C 4.9 11/16/2020   MICROALBUR 0.2 06/04/2019    --------------------------------------------------------------------------------------------------------------------- DG PAIN CLINIC C-ARM 1-60 MIN NO REPORT  Result Date:  02/03/2022 Fluoro was used, but no Radiologist interpretation will be provided. Please refer to "NOTES" tab for provider progress note.    Assessment & Plan:   Wendy Johnson was seen today for back pain and hip pain.  Diagnoses and all orders for this visit:  Chronic pain syndrome  DDD (degenerative disc disease), lumbar -     Lumbar Epidural Injection  Sciatica, right side -     Lumbar Epidural Injection  Pain of both hip joints  Chronic, continuous use of opioids  Low back pain at multiple sites  Cervicalgia  Pain due to total right knee replacement, sequela  Pain of right  hip joint  Low back derangement syndrome  Facet arthritis of lumbar region  Other orders -     Oxycodone HCl 10 MG TABS; Take 1 tablet (10 mg total) by mouth every 4 (four) hours as needed. -     Oxycodone HCl 10 MG TABS; Take 1 tablet (10 mg total) by mouth every 4 (four) hours as needed. -     triamcinolone acetonide (KENALOG-40) injection 40 mg -     sodium chloride flush (NS) 0.9 % injection 10 mL -     ropivacaine (PF) 2 mg/mL (0.2%) (NAROPIN) injection 10 mL -     lidocaine (PF) (XYLOCAINE) 1 % injection 5 mL -     iohexol (OMNIPAQUE) 180 MG/ML injection 10 mL        ----------------------------------------------------------------------------------------------------------------------  Problem List Items Addressed This Visit       Unprioritized   DDD (degenerative disc disease), lumbar   Relevant Medications   Oxycodone HCl 10 MG TABS   Oxycodone HCl 10 MG TABS (Start on 03/05/2022)   Facet arthritis of lumbar region   Relevant Medications   Oxycodone HCl 10 MG TABS   Oxycodone HCl 10 MG TABS (Start on 03/05/2022)   Low back derangement syndrome   Relevant Medications   Oxycodone HCl 10 MG TABS   Oxycodone HCl 10 MG TABS (Start on 03/05/2022)   Painful total knee replacement, right (HCC)   Sciatica, right side   Other Visit Diagnoses     Chronic pain syndrome    -  Primary    Relevant Medications   Oxycodone HCl 10 MG TABS   Oxycodone HCl 10 MG TABS (Start on 03/05/2022)   triamcinolone acetonide (KENALOG-40) injection 40 mg (Completed)   ropivacaine (PF) 2 mg/mL (0.2%) (NAROPIN) injection 10 mL (Completed)   lidocaine (PF) (XYLOCAINE) 1 % injection 5 mL   Pain of both hip joints       Chronic, continuous use of opioids       Low back pain at multiple sites       Relevant Medications   Oxycodone HCl 10 MG TABS   Oxycodone HCl 10 MG TABS (Start on 03/05/2022)   triamcinolone acetonide (KENALOG-40) injection 40 mg (Completed)   Cervicalgia       Pain of right hip joint             ----------------------------------------------------------------------------------------------------------------------  1. DDD (degenerative disc disease), lumbar We will proceed with a repeat epidural today.  Wendy Johnson's had multiple epidurals in the past and generally gets excellent relief lasting about 2 months before she has gradual recurrence of the same pain.  Unfortunately the pain persist and is recurrent in nature.  Conservative therapy has been ineffective and is generally required epidural management periodically to keep her active functional and with diminished pain.  She continues to utilize her opioid management to help with diffuse body pain. - Lumbar Epidural Injection  2. Sciatica, right side As above - Lumbar Epidural Injection  3. Chronic pain syndrome As above.  I have reviewed the [facility] practitioner database information is appropriate for refill for the next 2 months.  This to be for September 28 and October 28.  No other changes are initiated today.  I have encouraged her to continue follow-up with her primary care physicians for baseline medical care and Dr. Rudene Christians for orthopedics  4. Pain of both hip joints As above  5. Chronic, continuous use of opioids   6. Low back pain at multiple sites  7. Cervicalgia   8. Pain due to total right knee  replacement, sequela   9. Pain of right hip joint   10. Low back derangement syndrome   11. Facet arthritis of lumbar region     ----------------------------------------------------------------------------------------------------------------------  I am having Nobie Putnam. Halseth "Wendy Johnson" start on Oxycodone HCl and Oxycodone HCl. I am also having her maintain her amphetamine-dextroamphetamine, busPIRone, sertraline, clonazePAM, traZODone, amphetamine-dextroamphetamine, cyclobenzaprine, Latuda, naloxone, Calcium Carb-Cholecalciferol (CALCIUM 600 + D PO), Aspirin-Caffeine (BC FAST PAIN RELIEF ARTHRITIS PO), Podiatric Products (GOLD BOND FOOT EX), hydrOXYzine, nicotine, Trelegy Ellipta, dexlansoprazole, valsartan, Ubrelvy, lidocaine, betamethasone valerate, ibuprofen, and albuterol. We administered triamcinolone acetonide, sodium chloride flush, ropivacaine (PF) 2 mg/mL (0.2%), and iohexol. We will continue to administer ipratropium-albuterol.   Meds ordered this encounter  Medications   Oxycodone HCl 10 MG TABS    Sig: Take 1 tablet (10 mg total) by mouth every 4 (four) hours as needed.    Dispense:  150 tablet    Refill:  0   Oxycodone HCl 10 MG TABS    Sig: Take 1 tablet (10 mg total) by mouth every 4 (four) hours as needed.    Dispense:  150 tablet    Refill:  0   triamcinolone acetonide (KENALOG-40) injection 40 mg   sodium chloride flush (NS) 0.9 % injection 10 mL   ropivacaine (PF) 2 mg/mL (0.2%) (NAROPIN) injection 10 mL   lidocaine (PF) (XYLOCAINE) 1 % injection 5 mL   iohexol (OMNIPAQUE) 180 MG/ML injection 10 mL   Patient's Medications  New Prescriptions   OXYCODONE HCL 10 MG TABS    Take 1 tablet (10 mg total) by mouth every 4 (four) hours as needed.   OXYCODONE HCL 10 MG TABS    Take 1 tablet (10 mg total) by mouth every 4 (four) hours as needed.  Previous Medications   ALBUTEROL (VENTOLIN HFA) 108 (90 BASE) MCG/ACT INHALER    INHALE 2 PUFFS INTO THE LUNGS EVERY 6 HOURS AS  NEEDED FOR WHEEZING OR SHORTNESS OF BREATH   AMPHETAMINE-DEXTROAMPHETAMINE (ADDERALL) 15 MG TABLET    Take 15 mg by mouth daily in the afternoon.   AMPHETAMINE-DEXTROAMPHETAMINE (ADDERALL) 30 MG TABLET    Take 30 mg by mouth 2 (two) times daily.   ASPIRIN-CAFFEINE (BC FAST PAIN RELIEF ARTHRITIS PO)    Take 1 packet by mouth 2 (two) times daily as needed (pain).   BETAMETHASONE VALERATE (VALISONE) 0.1 % CREAM    Apply topically 2 (two) times daily.   BUSPIRONE (BUSPAR) 30 MG TABLET    Take 30 mg by mouth 2 (two) times daily.    CALCIUM CARB-CHOLECALCIFEROL (CALCIUM 600 + D PO)    Take 1 tablet by mouth 2 (two) times daily.   CLONAZEPAM (KLONOPIN) 1 MG TABLET    Take 1-2 mg by mouth 6 (six) times daily.   CYCLOBENZAPRINE (FLEXERIL) 10 MG TABLET    Take 10 mg by mouth 3 (three) times daily as needed for muscle spasms.   DEXLANSOPRAZOLE (DEXILANT) 60 MG CAPSULE    Take 1 capsule (60 mg total) by mouth daily.   FLUTICASONE-UMECLIDIN-VILANT (TRELEGY ELLIPTA) 100-62.5-25 MCG/ACT AEPB    Inhale 1 puff into the lungs daily.   HYDROXYZINE (ATARAX) 50 MG TABLET    Take by mouth.   IBUPROFEN (ADVIL) 800 MG TABLET    TAKE 1 TABLET BY MOUTH EVERY 8 HOURS AS NEEDED   LATUDA 80 MG TABS TABLET    Take 80 mg by mouth daily.   LIDOCAINE (  LIDODERM) 5 %    Place 2 patches onto the skin daily. Remove & Discard patch within 12 hours or as directed by MD   NALOXONE (NARCAN) NASAL SPRAY 4 MG/0.1 ML    As directed for opioid induced respiratory depression   NICOTINE (NICODERM CQ - DOSED IN MG/24 HOURS) 21 MG/24HR PATCH    Place 1 patch (21 mg total) onto the skin daily.   PODIATRIC PRODUCTS (GOLD BOND FOOT EX)    Apply 1 application topically daily.   SERTRALINE (ZOLOFT) 100 MG TABLET    Take 200 mg by mouth daily.   TRAZODONE (DESYREL) 100 MG TABLET    Take 400 mg by mouth at bedtime.    UBROGEPANT (UBRELVY) 100 MG TABS    Take 1 tablet by mouth every other day.   VALSARTAN (DIOVAN) 80 MG TABLET    Take 1 tablet (80 mg  total) by mouth daily.  Modified Medications   No medications on file  Discontinued Medications   No medications on file   ----------------------------------------------------------------------------------------------------------------------  Follow-up: Return in about 2 months (around 04/05/2022) for evaluation, med refill.   Procedure: L5-S1 LESI with fluoroscopic guidance and without moderate sedation  NOTE: The risks, benefits, and expectations of the procedure have been discussed and explained to the patient who was understanding and in agreement with suggested treatment plan. No guarantees were made.  DESCRIPTION OF PROCEDURE: Lumbar epidural steroid injection with no IV Versed, EKG, blood pressure, pulse, and pulse oximetry monitoring. The procedure was performed with the patient in the prone position under fluoroscopic guidance.  Sterile prep x3 was initiated and I then injected subcutaneous lidocaine to the overlying L5-S1 site after its fluoroscopic identifictation.  Using strict aseptic technique, I then advanced an 18-gauge Tuohy epidural needle in the midline using interlaminar approach via loss-of-resistance to saline technique. There was negative aspiration for heme or  CSF.  I then confirmed position with both AP and Lateral fluoroscan.  2 cc of contrast dye were injected and a  total of 5 mL of Preservative-Free normal saline mixed with 40 mg of Kenalog and 1cc Ropicaine 0.2 percent were injected incrementally via the  epidurally placed needle. The needle was removed. The patient tolerated the injection well and was convalesced and discharged to home in stable condition. Should the patient have any post procedure difficulty they have been instructed on how to contact us for assistance.    Molli Barrows, MD

## 2022-03-21 ENCOUNTER — Encounter: Payer: Self-pay | Admitting: Nurse Practitioner

## 2022-03-21 ENCOUNTER — Other Ambulatory Visit: Payer: Self-pay

## 2022-03-21 ENCOUNTER — Ambulatory Visit (INDEPENDENT_AMBULATORY_CARE_PROVIDER_SITE_OTHER): Payer: Medicare Other | Admitting: Nurse Practitioner

## 2022-03-21 VITALS — BP 132/74 | HR 93 | Temp 98.0°F | Resp 18 | Ht 64.0 in | Wt 227.2 lb

## 2022-03-21 DIAGNOSIS — Z1231 Encounter for screening mammogram for malignant neoplasm of breast: Secondary | ICD-10-CM | POA: Diagnosis not present

## 2022-03-21 DIAGNOSIS — Z23 Encounter for immunization: Secondary | ICD-10-CM | POA: Diagnosis not present

## 2022-03-21 DIAGNOSIS — R300 Dysuria: Secondary | ICD-10-CM | POA: Diagnosis not present

## 2022-03-21 LAB — POCT URINALYSIS DIPSTICK
Bilirubin, UA: NEGATIVE
Glucose, UA: NEGATIVE
Ketones, UA: NEGATIVE
Nitrite, UA: NEGATIVE
Protein, UA: POSITIVE — AB
Spec Grav, UA: 1.02 (ref 1.010–1.025)
Urobilinogen, UA: 0.2 E.U./dL
pH, UA: 5 (ref 5.0–8.0)

## 2022-03-21 MED ORDER — PHENAZOPYRIDINE HCL 100 MG PO TABS: 100.0000 mg | ORAL_TABLET | Freq: Three times a day (TID) | ORAL | 0 refills | Status: DC | PRN

## 2022-03-21 MED ORDER — CEPHALEXIN 500 MG PO CAPS: 500.0000 mg | ORAL_CAPSULE | Freq: Two times a day (BID) | ORAL | 0 refills | Status: AC

## 2022-03-21 NOTE — Progress Notes (Signed)
BP 132/74   Pulse 93   Temp 98 F (36.7 C) (Oral)   Resp 18   Ht '5\' 4"'$  (1.626 m)   Wt 227 lb 3.2 oz (103.1 kg)   LMP 09/26/2014 Comment: Total  SpO2 98%   BMI 39.00 kg/m    Subjective:    Patient ID: Wendy Johnson, female    DOB: 1966-02-12, 56 y.o.   MRN: [id number]  HPI: MEGHAN WARSHAWSKY is a 56 y.o. female  Chief Complaint  Patient presents with   Dysuria    Burning when urinating, so spots of blood   Dysuria/hematuria:  Symptoms started last week.  She says she has had dysuria. She says she did notice some blood in her urine.   She denies any fever or back pain.  She denies any frequency. She reports she has no vaginal discharge or itching. She declines vaginal swab.  Urine dip showed positive for blood, protein and leukocytes.  Will send for culture. Will start patient on pyridium and keflex.  Push fluids.    Anemia:  patient last saw Dr. Ancil Boozer on 09/10/2021.  She had ordered iron panel and cbc due to patient anemia. Patient did not get labs done.  Recommended patient get her labs done. Labs printed out and patient agreed to get labs done.  Also patient is due for routine follow up with Dr. Ancil Boozer.  Recommend patient schedule appointment.   Relevant past medical, surgical, family and social history reviewed and updated as indicated. Interim medical history since our last visit reviewed. Allergies and medications reviewed and updated.  Review of Systems  Constitutional: Negative for fever or weight change.  Respiratory: Negative for cough and shortness of breath.   Cardiovascular: Negative for chest pain or palpitations.  Gastrointestinal: Negative for abdominal pain, no bowel changes.  Musculoskeletal: Negative for gait problem or joint swelling.  Skin: Negative for rash.  Neurological: Negative for dizziness or headache.  No other specific complaints in a complete review of systems (except as listed in HPI above).      Objective:    BP 132/74   Pulse 93   Temp 98  F (36.7 C) (Oral)   Resp 18   Ht '5\' 4"'$  (1.626 m)   Wt 227 lb 3.2 oz (103.1 kg)   LMP 09/26/2014 Comment: Total  SpO2 98%   BMI 39.00 kg/m   Wt Readings from Last 3 Encounters:  03/21/22 227 lb 3.2 oz (103.1 kg)  02/03/22 240 lb (108.9 kg)  10/05/21 240 lb (108.9 kg)    Physical Exam Constitutional: Patient appears well-developed and well-nourished. Obese  No distress.  HEENT: head atraumatic, normocephalic, pupils equal and reactive to light,  neck supple Cardiovascular: Normal rate, regular rhythm and normal heart sounds.  No murmur heard. No BLE edema. Pulmonary/Chest: Effort normal and breath sounds normal. No respiratory distress. Abdominal: Soft.  There is no tenderness. NO CVA tenderness.  Psychiatric: Patient has a normal mood and affect. behavior is normal. Judgment and thought content normal.      Assessment & Plan:   Problem List Items Addressed This Visit   None Visit Diagnoses     Dysuria    -  Primary   push fluids, start keflex and pyridium.  urine sent for culture   Relevant Medications   cephALEXin (KEFLEX) 500 MG capsule   phenazopyridine (PYRIDIUM) 100 MG tablet   Other Relevant Orders   POCT urinalysis dipstick (Completed)   Urine Culture   Screening mammogram  for breast cancer       Relevant Orders   MM 3D SCREEN BREAST BILATERAL   Need for influenza vaccination       Relevant Orders   Flu Vaccine QUAD 6+ mos PF IM (Fluarix Quad PF) (Completed)   Need for zoster vaccination       Relevant Orders   Zoster Recombinant (Shingrix ) (Completed)        Follow up plan: Return in about 3 months (around 06/21/2022) for follow up, with Dr. Ancil Boozer.

## 2022-03-22 LAB — CBC WITH DIFFERENTIAL/PLATELET
Absolute Monocytes: 456 cells/uL (ref 200–950)
Basophils Absolute: 54 cells/uL (ref 0–200)
Basophils Relative: 0.8 %
Eosinophils Absolute: 114 cells/uL (ref 15–500)
Eosinophils Relative: 1.7 %
HCT: 42.8 % (ref 35.0–45.0)
Hemoglobin: 13.9 g/dL (ref 11.7–15.5)
Lymphs Abs: 1688 cells/uL (ref 850–3900)
MCH: 28.6 pg (ref 27.0–33.0)
MCHC: 32.5 g/dL (ref 32.0–36.0)
MCV: 88.1 fL (ref 80.0–100.0)
MPV: 11.6 fL (ref 7.5–12.5)
Monocytes Relative: 6.8 %
Neutro Abs: 4389 cells/uL (ref 1500–7800)
Neutrophils Relative %: 65.5 %
Platelets: 280 10*3/uL (ref 140–400)
RBC: 4.86 10*6/uL (ref 3.80–5.10)
RDW: 14.6 % (ref 11.0–15.0)
Total Lymphocyte: 25.2 %
WBC: 6.7 10*3/uL (ref 3.8–10.8)

## 2022-03-22 LAB — URINE CULTURE
MICRO NUMBER:: 14180938
SPECIMEN QUALITY:: ADEQUATE

## 2022-03-22 LAB — IRON,TIBC AND FERRITIN PANEL
%SAT: 11 % (calc) — ABNORMAL LOW (ref 16–45)
Ferritin: 19 ng/mL (ref 16–232)
Iron: 38 ug/dL — ABNORMAL LOW (ref 45–160)
TIBC: 333 mcg/dL (calc) (ref 250–450)

## 2022-03-22 LAB — HEPATITIS C ANTIBODY: Hepatitis C Ab: NONREACTIVE

## 2022-04-04 ENCOUNTER — Encounter: Payer: Self-pay | Admitting: Anesthesiology

## 2022-04-04 ENCOUNTER — Ambulatory Visit: Payer: Medicare Other | Attending: Anesthesiology | Admitting: Anesthesiology

## 2022-04-04 ENCOUNTER — Other Ambulatory Visit: Payer: Self-pay | Admitting: Family Medicine

## 2022-04-04 DIAGNOSIS — M25551 Pain in right hip: Secondary | ICD-10-CM

## 2022-04-04 DIAGNOSIS — M5136 Other intervertebral disc degeneration, lumbar region: Secondary | ICD-10-CM

## 2022-04-04 DIAGNOSIS — G894 Chronic pain syndrome: Secondary | ICD-10-CM

## 2022-04-04 DIAGNOSIS — M5431 Sciatica, right side: Secondary | ICD-10-CM | POA: Diagnosis not present

## 2022-04-04 DIAGNOSIS — M542 Cervicalgia: Secondary | ICD-10-CM

## 2022-04-04 DIAGNOSIS — T8484XS Pain due to internal orthopedic prosthetic devices, implants and grafts, sequela: Secondary | ICD-10-CM

## 2022-04-04 DIAGNOSIS — M5386 Other specified dorsopathies, lumbar region: Secondary | ICD-10-CM

## 2022-04-04 DIAGNOSIS — M545 Low back pain, unspecified: Secondary | ICD-10-CM

## 2022-04-04 DIAGNOSIS — Z96651 Presence of right artificial knee joint: Secondary | ICD-10-CM

## 2022-04-04 DIAGNOSIS — M47816 Spondylosis without myelopathy or radiculopathy, lumbar region: Secondary | ICD-10-CM

## 2022-04-04 DIAGNOSIS — I10 Essential (primary) hypertension: Secondary | ICD-10-CM

## 2022-04-04 DIAGNOSIS — M25552 Pain in left hip: Secondary | ICD-10-CM

## 2022-04-04 DIAGNOSIS — F1190 Opioid use, unspecified, uncomplicated: Secondary | ICD-10-CM

## 2022-04-04 DIAGNOSIS — M25511 Pain in right shoulder: Secondary | ICD-10-CM

## 2022-04-04 DIAGNOSIS — Z79891 Long term (current) use of opiate analgesic: Secondary | ICD-10-CM

## 2022-04-04 MED ORDER — CYCLOBENZAPRINE HCL 10 MG PO TABS: 10.0000 mg | ORAL_TABLET | Freq: Two times a day (BID) | ORAL | 3 refills | Status: AC | PRN

## 2022-04-04 MED ORDER — OXYCODONE HCL 10 MG PO TABS: 10.0000 mg | ORAL_TABLET | Freq: Four times a day (QID) | ORAL | 0 refills | Status: DC

## 2022-04-04 NOTE — Progress Notes (Unsigned)
Virtual Visit via Telephone Note  I connected with Wendy Johnson on 04/04/22 at  2:20 PM EST by telephone and verified that I am speaking with the correct person using two identifiers.  Location: Patient: Home Provider: [facility] control center   I discussed the limitations, risks, security and privacy concerns of performing an evaluation and management service by telephone and the availability of in person appointments. I also discussed with the patient that there may be a patient responsible charge related to this service. The patient expressed understanding and agreed to proceed.   History of Present Illness: I was able to reach pain Wendy Johnson for evaluation today.  We were able to do this via telephone as it was unable to do the video portion of the virtual conference.  She reports that she has been doing quite well recently.  No change in the quality characteristic or distribution of the low back pain is noted.  She still has considerable pain throughout the day and takes her oxycodone 4 times a day for this.  She generally gets about 50 to 75% relief when she does this.  She recently had an epidural that helped with her sciatica symptoms but the back pain persist.  Overall she feels like she did very well with the epidural.  Otherwise she is in her usual state of health.  No new changes in the quality characteristic of the pain are noted and she is trying to stay active.  She is trying to walk daily and do her stretching exercises while she works on losing weight.  Otherwise she is in her usual state of health.  Review of systems: General: No fevers or chills Pulmonary: No shortness of breath or dyspnea Cardiac: No angina or palpitations or lightheadedness GI: No abdominal pain or constipation Psych: No depression    Observations/Objective:  Current Outpatient Medications:    Oxycodone HCl 10 MG TABS, Take 1 tablet (10 mg total) by mouth in the morning, at noon, in the evening, and at  bedtime., Disp: 120 tablet, Rfl: 0   [START ON 05/04/2022] Oxycodone HCl 10 MG TABS, Take 1 tablet (10 mg total) by mouth in the morning, at noon, in the evening, and at bedtime., Disp: 120 tablet, Rfl: 0   albuterol (VENTOLIN HFA) 108 (90 Base) MCG/ACT inhaler, INHALE 2 PUFFS INTO THE LUNGS EVERY 6 HOURS AS NEEDED FOR WHEEZING OR SHORTNESS OF BREATH, Disp: 8.5 g, Rfl: 0   amphetamine-dextroamphetamine (ADDERALL) 15 MG tablet, Take 15 mg by mouth daily in the afternoon., Disp: , Rfl:    amphetamine-dextroamphetamine (ADDERALL) 30 MG tablet, Take 30 mg by mouth 2 (two) times daily., Disp: , Rfl:    Aspirin-Caffeine (BC FAST PAIN RELIEF ARTHRITIS PO), Take 1 packet by mouth 2 (two) times daily as needed (pain)., Disp: , Rfl:    betamethasone valerate (VALISONE) 0.1 % cream, Apply topically 2 (two) times daily., Disp: 30 g, Rfl: 0   busPIRone (BUSPAR) 30 MG tablet, Take 30 mg by mouth 2 (two) times daily. , Disp: , Rfl:    Calcium Carb-Cholecalciferol (CALCIUM 600 + D PO), Take 1 tablet by mouth 2 (two) times daily., Disp: , Rfl:    clonazePAM (KLONOPIN) 1 MG tablet, Take 1-2 mg by mouth 6 (six) times daily., Disp: , Rfl:    cyclobenzaprine (FLEXERIL) 10 MG tablet, Take 1 tablet (10 mg total) by mouth 2 (two) times daily as needed for muscle spasms., Disp: 60 tablet, Rfl: 3   dexlansoprazole (DEXILANT) 60 MG  capsule, Take 1 capsule (60 mg total) by mouth daily., Disp: 90 capsule, Rfl: 1   Fluticasone-Umeclidin-Vilant (TRELEGY ELLIPTA) 100-62.5-25 MCG/ACT AEPB, Inhale 1 puff into the lungs daily., Disp: 1 each, Rfl: 11   hydrOXYzine (ATARAX) 50 MG tablet, Take by mouth., Disp: , Rfl:    ibuprofen (ADVIL) 800 MG tablet, TAKE 1 TABLET BY MOUTH EVERY 8 HOURS AS NEEDED, Disp: 90 tablet, Rfl: 0   LATUDA 80 MG TABS tablet, Take 80 mg by mouth daily., Disp: , Rfl:    naloxone (NARCAN) nasal spray 4 mg/0.1 mL, As directed for opioid induced respiratory depression, Disp: 1 each, Rfl: 1   Oxycodone HCl 10 MG  TABS, Take 1 tablet (10 mg total) by mouth every 4 (four) hours as needed., Disp: 150 tablet, Rfl: 0   phenazopyridine (PYRIDIUM) 100 MG tablet, Take 1 tablet (100 mg total) by mouth 3 (three) times daily as needed for pain., Disp: 10 tablet, Rfl: 0   Podiatric Products (GOLD BOND FOOT EX), Apply 1 application topically daily., Disp: , Rfl:    sertraline (ZOLOFT) 100 MG tablet, Take 200 mg by mouth daily., Disp: , Rfl:    traZODone (DESYREL) 100 MG tablet, Take 400 mg by mouth at bedtime. , Disp: , Rfl:    valsartan (DIOVAN) 80 MG tablet, Take 1 tablet (80 mg total) by mouth daily., Disp: 90 tablet, Rfl: 1  Current Facility-Administered Medications:    ipratropium-albuterol (DUONEB) 0.5-2.5 (3) MG/3ML nebulizer solution 3 mL, 3 mL, Nebulization, Q6H PRN, Allyne Gee, MD, 3 mL at 06/17/21 1039   Past Medical History:  Diagnosis Date   ADHD (attention deficit hyperactivity disorder)    Anemia 09/10/2021   Anxiety    AR (allergic rhinitis)    Arthritis    Benign hypertension    NO MEDS   Bipolar disorder (HCC)    Chronic back pain    Chronic constipation    Chronic insomnia    COPD (chronic obstructive pulmonary disease) (HCC)    Deaf    RIGHT EAR   Decreased dorsalis pedis pulse    Depression    Dyslipidemia    Fatty liver    GERD (gastroesophageal reflux disease)    Hepatomegaly    High cholesterol    Hot flashes    Migraine with aura    OCD (obsessive compulsive disorder)    Pain in joint, shoulder region 12/19/2018   Plantar warts    Sciatica, right side 02/18/2019   Severe obesity (HCC)    Shortness of breath dyspnea    Sleep apnea    NO CPAP   Tobacco use    Trochanteric bursitis of right hip      Assessment and Plan: 1. DDD (degenerative disc disease), lumbar   2. Sciatica, right side   3. Chronic pain syndrome   4. Pain of both hip joints   5. Chronic, continuous use of opioids   6. Low back pain at multiple sites   7. Cervicalgia   8. Pain due to total  right knee replacement, sequela   9. Pain of right hip joint   10. Low back derangement syndrome   11. Facet arthritis of lumbar region   12. Pain in joint of right shoulder   Based on our discussion today I think it is appropriate to continue her on her current pharmacologic regimen.  She is due for refills on her oxycodone.  This is scheduled for 1127 and 1227.  Furthermore we will refill her Flexeril  as well.  I encouraged her to continue efforts at weight loss stretching strengthening and ambulation as she is currently trying.  Continue follow-up with her primary care physicians for baseline medical care.  I have reviewed the [facility] practitioner database information and it is appropriate.  We will schedule her for return to clinic in 2 months.  Follow Up Instructions:    I discussed the assessment and treatment plan with the patient. The patient was provided an opportunity to ask questions and all were answered. The patient agreed with the plan and demonstrated an understanding of the instructions.   The patient was advised to call back or seek an in-person evaluation if the symptoms worsen or if the condition fails to improve as anticipated.  I provided 30 minutes of non-face-to-face time during this encounter.   Molli Barrows, MD

## 2022-04-05 ENCOUNTER — Telehealth: Payer: Self-pay | Admitting: Anesthesiology

## 2022-04-05 ENCOUNTER — Other Ambulatory Visit: Payer: Self-pay | Admitting: *Deleted

## 2022-04-05 MED ORDER — OXYCODONE HCL 10 MG PO TABS: 10.0000 mg | ORAL_TABLET | Freq: Four times a day (QID) | ORAL | 0 refills | Status: AC

## 2022-04-05 NOTE — Telephone Encounter (Signed)
PT wanted Oxycodone to be send to [facility] on [address] in [place]. PT stated current pharmacy is out and doesn't know when they will have medication in stock. PT asked to be call once its been send in. Thanks

## 2022-04-05 NOTE — Telephone Encounter (Signed)
Note and text sent to Dr. Andree Elk. Awaiting reply.

## 2022-04-05 NOTE — Telephone Encounter (Signed)
Patient called to check to see if Dr. Andree Elk had called in her meds to [facility].

## 2022-04-05 NOTE — Telephone Encounter (Signed)
I called patient and let her know (via voicemail) that Dr. Andree Elk is aware that meds need to be sent to [facility].

## 2022-05-04 ENCOUNTER — Other Ambulatory Visit: Payer: Self-pay | Admitting: Family Medicine

## 2022-05-04 DIAGNOSIS — K219 Gastro-esophageal reflux disease without esophagitis: Secondary | ICD-10-CM

## 2022-05-24 DIAGNOSIS — G4733 Obstructive sleep apnea (adult) (pediatric): Secondary | ICD-10-CM | POA: Diagnosis not present

## 2022-05-31 ENCOUNTER — Telehealth: Payer: Medicare Other | Admitting: Anesthesiology

## 2022-05-31 DIAGNOSIS — R531 Weakness: Secondary | ICD-10-CM | POA: Diagnosis not present

## 2022-05-31 DIAGNOSIS — Z96651 Presence of right artificial knee joint: Secondary | ICD-10-CM | POA: Diagnosis not present

## 2022-05-31 DIAGNOSIS — G4733 Obstructive sleep apnea (adult) (pediatric): Secondary | ICD-10-CM | POA: Diagnosis not present

## 2022-06-01 ENCOUNTER — Encounter: Payer: Self-pay | Admitting: Anesthesiology

## 2022-06-01 ENCOUNTER — Ambulatory Visit: Payer: 59 | Attending: Anesthesiology | Admitting: Anesthesiology

## 2022-06-01 DIAGNOSIS — T8484XS Pain due to internal orthopedic prosthetic devices, implants and grafts, sequela: Secondary | ICD-10-CM

## 2022-06-01 DIAGNOSIS — M47816 Spondylosis without myelopathy or radiculopathy, lumbar region: Secondary | ICD-10-CM

## 2022-06-01 DIAGNOSIS — G894 Chronic pain syndrome: Secondary | ICD-10-CM | POA: Diagnosis not present

## 2022-06-01 DIAGNOSIS — M5431 Sciatica, right side: Secondary | ICD-10-CM

## 2022-06-01 DIAGNOSIS — Z96651 Presence of right artificial knee joint: Secondary | ICD-10-CM

## 2022-06-01 DIAGNOSIS — M545 Low back pain, unspecified: Secondary | ICD-10-CM

## 2022-06-01 DIAGNOSIS — M25551 Pain in right hip: Secondary | ICD-10-CM | POA: Diagnosis not present

## 2022-06-01 DIAGNOSIS — M5386 Other specified dorsopathies, lumbar region: Secondary | ICD-10-CM

## 2022-06-01 DIAGNOSIS — M25552 Pain in left hip: Secondary | ICD-10-CM

## 2022-06-01 DIAGNOSIS — F1190 Opioid use, unspecified, uncomplicated: Secondary | ICD-10-CM

## 2022-06-01 DIAGNOSIS — M5136 Other intervertebral disc degeneration, lumbar region: Secondary | ICD-10-CM

## 2022-06-01 DIAGNOSIS — M542 Cervicalgia: Secondary | ICD-10-CM

## 2022-06-01 MED ORDER — OXYCODONE HCL 10 MG PO TABS: 10.0000 mg | ORAL_TABLET | ORAL | 0 refills | Status: DC

## 2022-06-01 MED ORDER — OXYCODONE HCL 10 MG PO TABS: 10.0000 mg | ORAL_TABLET | ORAL | 0 refills | Status: AC

## 2022-06-01 NOTE — Progress Notes (Signed)
Virtual Visit via Telephone Note  I connected with Wendy Johnson on 06/01/22 at  2:20 PM EST by telephone and verified that I am speaking with the correct person using two identifiers.  Location: Patient: Home Provider: Pain control center   I discussed the limitations, risks, security and privacy concerns of performing an evaluation and management service by telephone and the availability of in person appointments. I also discussed with the patient that there may be a patient responsible charge related to this service. The patient expressed understanding and agreed to proceed.   History of Present Illness: I spoke with Wendy Johnson via telephone regarding her persistent low back pain hip pain buttock pain and leg pain.  She had an epidural steroid back in September which gave her excellent relief rated about 75 to 100% relief in the legs and about 75% in the back.  The pain in her back is persistent but the pain in the legs generally responds favorably to epidural steroid injections.  Despite efforts at conservative care including stretching strengthening and physical therapy exercises and conservative medication management she has been unable to gain long-term relief.  She utilizes opioids on a daily basis and these continue to persist stable.  At this point she desires to proceed with a repeat epidural.  She has these about every 3 to 4 months and they enable her to stay active continue to do her stretching strengthening exercises and make her medication management better as well.  She takes her opioids as prescribed without evidence of illicit use and gains good relief with these.  Otherwise she is in her usual state of health with no new changes in lower extremity strength function or bowel bladder function.   Review of systems: General: No fevers or chills Pulmonary: No shortness of breath or dyspnea Cardiac: No angina or palpitations or lightheadedness GI: No abdominal pain or  constipation Psych: No depression   Observations/Objective:  Current Outpatient Medications:    [START ON 07/01/2022] Oxycodone HCl 10 MG TABS, Take 1 tablet (10 mg total) by mouth every 4 (four) hours., Disp: 150 tablet, Rfl: 0   albuterol (VENTOLIN HFA) 108 (90 Base) MCG/ACT inhaler, INHALE 2 PUFFS INTO THE LUNGS EVERY 6 HOURS AS NEEDED FOR WHEEZING OR SHORTNESS OF BREATH, Disp: 8.5 g, Rfl: 0   amphetamine-dextroamphetamine (ADDERALL) 15 MG tablet, Take 15 mg by mouth daily in the afternoon., Disp: , Rfl:    amphetamine-dextroamphetamine (ADDERALL) 30 MG tablet, Take 30 mg by mouth 2 (two) times daily., Disp: , Rfl:    Aspirin-Caffeine (BC FAST PAIN RELIEF ARTHRITIS PO), Take 1 packet by mouth 2 (two) times daily as needed (pain)., Disp: , Rfl:    betamethasone valerate (VALISONE) 0.1 % cream, Apply topically 2 (two) times daily., Disp: 30 g, Rfl: 0   busPIRone (BUSPAR) 30 MG tablet, Take 30 mg by mouth 2 (two) times daily. , Disp: , Rfl:    Calcium Carb-Cholecalciferol (CALCIUM 600 + D PO), Take 1 tablet by mouth 2 (two) times daily., Disp: , Rfl:    clonazePAM (KLONOPIN) 1 MG tablet, Take 1-2 mg by mouth 6 (six) times daily., Disp: , Rfl:    cyclobenzaprine (FLEXERIL) 10 MG tablet, Take 1 tablet (10 mg total) by mouth 2 (two) times daily as needed for muscle spasms., Disp: 60 tablet, Rfl: 3   dexlansoprazole (DEXILANT) 60 MG capsule, TAKE 1 CAPSULE BY MOUTH DAILY, Disp: 90 capsule, Rfl: 0   Fluticasone-Umeclidin-Vilant (TRELEGY ELLIPTA) 100-62.5-25 MCG/ACT AEPB, Inhale 1  puff into the lungs daily., Disp: 1 each, Rfl: 11   hydrOXYzine (ATARAX) 50 MG tablet, Take by mouth., Disp: , Rfl:    ibuprofen (ADVIL) 800 MG tablet, TAKE 1 TABLET BY MOUTH EVERY 8 HOURS AS NEEDED, Disp: 90 tablet, Rfl: 0   LATUDA 80 MG TABS tablet, Take 80 mg by mouth daily., Disp: , Rfl:    naloxone (NARCAN) nasal spray 4 mg/0.1 mL, As directed for opioid induced respiratory depression, Disp: 1 each, Rfl: 1   Oxycodone  HCl 10 MG TABS, Take 1 tablet (10 mg total) by mouth every 4 (four) hours., Disp: 150 tablet, Rfl: 0   phenazopyridine (PYRIDIUM) 100 MG tablet, Take 1 tablet (100 mg total) by mouth 3 (three) times daily as needed for pain., Disp: 10 tablet, Rfl: 0   Podiatric Products (GOLD BOND FOOT EX), Apply 1 application topically daily., Disp: , Rfl:    sertraline (ZOLOFT) 100 MG tablet, Take 200 mg by mouth daily., Disp: , Rfl:    traZODone (DESYREL) 100 MG tablet, Take 400 mg by mouth at bedtime. , Disp: , Rfl:    valsartan (DIOVAN) 80 MG tablet, TAKE 1 TABLET BY MOUTH DAILY, Disp: 90 tablet, Rfl: 0  Current Facility-Administered Medications:    ipratropium-albuterol (DUONEB) 0.5-2.5 (3) MG/3ML nebulizer solution 3 mL, 3 mL, Nebulization, Q6H PRN, Allyne Gee, MD, 3 mL at 06/17/21 1039   Past Medical History:  Diagnosis Date   ADHD (attention deficit hyperactivity disorder)    Anemia 09/10/2021   Anxiety    AR (allergic rhinitis)    Arthritis    Benign hypertension    NO MEDS   Bipolar disorder (HCC)    Chronic back pain    Chronic constipation    Chronic insomnia    COPD (chronic obstructive pulmonary disease) (HCC)    Deaf    RIGHT EAR   Decreased dorsalis pedis pulse    Depression    Dyslipidemia    Fatty liver    GERD (gastroesophageal reflux disease)    Hepatomegaly    High cholesterol    Hot flashes    Migraine with aura    OCD (obsessive compulsive disorder)    Pain in joint, shoulder region 12/19/2018   Plantar warts    Sciatica, right side 02/18/2019   Severe obesity (HCC)    Shortness of breath dyspnea    Sleep apnea    NO CPAP   Tobacco use    Trochanteric bursitis of right hip      Assessment and Plan: 1. DDD (degenerative disc disease), lumbar   2. Sciatica, right side   3. Chronic pain syndrome   4. Pain of both hip joints   5. Chronic, continuous use of opioids   6. Low back pain at multiple sites   7. Cervicalgia   8. Pain due to total right knee  replacement, sequela   9. Pain of right hip joint   10. Low back derangement syndrome   11. Facet arthritis of lumbar region   Based on our discussion today I think is appropriate to refill her medicines for the next 2 months.  This to be dated for today and a month from today.  I will keep her on every 4 hour regimen as this seems to be working the best for her.  She is without side effect and reports good relief and improved overall function and lifestyle.  She sleeps better with this regimen as well and no side effects are noted.  In the meantime I am going to schedule her for return to clinic in 2 months.  She is currently trying to get over a COVID upper respiratory infection.  Once that he is cleared we will plan on proceeding with a repeat epidural per patient request.  We gone over the risks and benefits of the procedure with her in full detail all questions were answered.  She generally gets about 75 to 100% relief in her sciatica lasting about 2 to 3 months before she has recurrence of her baseline pain.  Continue follow-up with her primary care physicians for baseline medical care with return to clinic in 2 months  Follow Up Instructions:    I discussed the assessment and treatment plan with the patient. The patient was provided an opportunity to ask questions and all were answered. The patient agreed with the plan and demonstrated an understanding of the instructions.   The patient was advised to call back or seek an in-person evaluation if the symptoms worsen or if the condition fails to improve as anticipated.  I provided 30 minutes of non-face-to-face time during this encounter.   Molli Barrows, MD

## 2022-06-13 ENCOUNTER — Other Ambulatory Visit: Payer: Self-pay | Admitting: Family Medicine

## 2022-06-13 DIAGNOSIS — I10 Essential (primary) hypertension: Secondary | ICD-10-CM

## 2022-06-20 NOTE — Progress Notes (Deleted)
Name: Wendy Johnson   MRN: [id number]    DOB: 1966-02-23   Date:06/20/2022       Progress Note  Subjective  Chief Complaint  Follow Up  HPI  GERD: she takes Dexilant as prescribed and states her symptoms have been controlled. She states symptoms returns if she is off medication for a couple of days. No longer having regurgitation or heartburn    HTN: she is taking Diovan 80 mg  and bp is at goal, no side effects .   Dyslipidemia:  She has high triglycerides , low HDl and high LDL, she has been off medications, reviewed last labs and LDL was 106    DDD lumbar spine: she sees pain clinic - Dr Andree Elk, she is on Oxycodone 10 mg QID, denies constipation - controlled with Benifiber, she has narcan at home. She also has OA of knee and shoulders and sees Ortho at [facility] and has steroid injections . She had a revision of right total knee 10/22 , she continues to have daily pain, she has been taking ibuprofen prn    Morbid obesity: her maximum weight was around 273 lbs, she lost down to 184 lbs with physical activity and life style modification, she gradually gained weight back, weight now is between 230's and 250's , she states she will start going back to the gym again.   Bipolar disorder: on multiple medications , given by Dr. Kasandra Knudsen she has follow up with him next week.  She states her mood is stable.    COPD : she is now seeing Dr. Raul Del, on Trelegy and states no sob, she states sob and wheezing controlled with medication - symptoms improved    Alcohol history: she completely quit Feb 2017 but had a relapse, she has quit again since June 2020. Stable   Migraine headaches: she states episodes more often lately, she is taking BC's to control symptoms, cannot tolerate imitrex, she has tried topamax in the past but did not work. Discussed rebound headaches. We will try Roselyn Meier and explained must stop nsaid's to make daily headache resolve. Nurtec prn did not work   Anemia : colonoscopy is up  to date. Advised to get labs rechecked today   Patient Active Problem List   Diagnosis Date Noted   Left-sided chest wall pain 09/10/2021   Eczema of external ear, right 09/10/2021   Headache, chronic daily 09/10/2021   Hypertension, benign 09/10/2021   S/P revision of total knee, right 02/11/2021   Esophageal dysphagia    History of colonic polyps    Sciatica, right side 02/18/2019   Pain in joint, shoulder region 12/19/2018   Unspecified inflammatory spondylopathy, lumbar region ([place]) 10/04/2018   BMI 40.0-44.9, adult ([place]) 08/17/2018   Lumbar spondylosis 03/19/2018   Polyarthralgia 03/19/2018   History of alcoholism ([place]) 07/19/2017   Bilateral carpal tunnel syndrome 05/29/2017   Painful total knee replacement, right ([place]) 10/11/2016   Rotator cuff tendinitis, right 07/29/2016   Occipital neuralgia 10/12/2015   Instability of prosthetic knee ([place]) 09/22/2015   Primary osteoarthritis of right hip 05/12/2015   Sleep apnea 04/07/2015   Primary osteoarthritis of left hip [id number]   Metabolic syndrome [id number]   Migraine without aura and with status migrainosus, not intractable 11/26/2014   Gastroesophageal reflux disease without esophagitis 11/26/2014   COPD, moderate ([place]) 11/26/2014   Nocturnal oxygen desaturation 11/26/2014   Supplemental oxygen dependent 11/26/2014   Hearing loss 11/26/2014   Pain syndrome, chronic 11/26/2014   History of  hypertension 11/26/2014   Dyslipidemia 11/26/2014   Morbid obesity ([place]) 11/26/2014   Chronic constipation 11/26/2014   Generalized anxiety disorder 11/11/2014   DDD (degenerative disc disease), lumbar 11/11/2014   Facet arthritis of lumbar region 11/11/2014   Primary osteoarthritis involving multiple joints 11/11/2014   H/O hysterectomy for benign disease 10/20/2014   Low back derangement syndrome 09/30/2014   Bipolar disorder ([place]) 09/30/2014    Past Surgical History:  Procedure Laterality Date   ABDOMINAL HYSTERECTOMY N/A  10/20/2014   Procedure: Total abdominial hysterectomy, bilateral salpingo-oophorectomy;  Surgeon: Brayton Mars, MD;  Location: [facility];  Service: Gynecology;  Laterality: N/A;   BILATERAL SALPINGOOPHORECTOMY     bone spurs removed Bilateral    CARPAL TUNNEL RELEASE Left 06/08/2017   Procedure: CARPAL TUNNEL RELEASE;  Surgeon: Hessie Knows, MD;  Location: [facility];  Service: Orthopedics;  Laterality: Left;   CARPAL TUNNEL RELEASE Right 10/10/2017   Procedure: CARPAL TUNNEL RELEASE;  Surgeon: Hessie Knows, MD;  Location: [facility];  Service: Orthopedics;  Laterality: Right;   COLONOSCOPY WITH PROPOFOL N/A 09/01/2016   Procedure: COLONOSCOPY WITH PROPOFOL;  Surgeon: Jonathon Bellows, MD;  Location: [facility];  Service: Endoscopy;  Laterality: N/A;   COLONOSCOPY WITH PROPOFOL N/A 07/30/2019   Procedure: COLONOSCOPY WITH PROPOFOL;  Surgeon: Lucilla Lame, MD;  Location: [facility] ENDOSCOPY;  Service: Endoscopy;  Laterality: N/A;   DORSAL COMPARTMENT RELEASE Left 06/08/2017   Procedure: RELEASE DORSAL COMPARTMENT (DEQUERVAIN);  Surgeon: Hessie Knows, MD;  Location: [facility];  Service: Orthopedics;  Laterality: Left;   ESOPHAGOGASTRODUODENOSCOPY (EGD) WITH PROPOFOL N/A 09/01/2016   Procedure: ESOPHAGOGASTRODUODENOSCOPY (EGD) WITH PROPOFOL;  Surgeon: Jonathon Bellows, MD;  Location: [facility] END[facility]COPY;  Service: Endoscopy;  Laterality: N/A;   ESOPHAGOGASTRODUODENOSCOPY (EGD) WITH PROPOFOL N/A 07/30/2019   Procedure: ESOPHAGOGASTRODUODENOSCOPY (EGD) WITH PROPOFOL;  Surgeon: Lucilla Lame, MD;  Location: [facility] ENDOSCOPY;  Service: Endoscopy;  Laterality: N/A;   FOOT SURGERY Bilateral    INSERTION OF MESH N/A 12/26/2017   Procedure: INSERTION OF MESH;  Surgeon: Jules Husbands, MD;  Location: [facility];  Service: General;  Laterality: N/A;   JOINT REPLACEMENT Right    Total Knee replacement X 2   JOINT REPLACEMENT Bilateral    Total Hip Replacement   knee arthroscopo Right    LAPAROSCOPY  09/22/2014   Procedure:  LAPAROSCOPY OPERATIVE;  Surgeon: Brayton Mars, MD;  Location: [facility];  Service: Gynecology;;  excision and fulgeration of endomertriosis   LIPOMA EXCISION     ROBOTIC ASSISTED LAPAROSCOPIC VENTRAL/INCISIONAL HERNIA REPAIR N/A 12/26/2017   Procedure: ROBOTIC ASSISTED LAPAROSCOPIC VENTRAL/INCISIONAL [place];  Surgeon: Jules Husbands, MD;  Location: [facility];  Service: General;  Laterality: N/A;   TONSILLECTOMY     TOTAL HIP ARTHROPLASTY Left 12/16/2014   Procedure: TOTAL HIP ARTHROPLASTY ANTERIOR APPROACH;  Surgeon: Hessie Knows, MD;  Location: [facility];  Service: Orthopedics;  Laterality: Left;   TOTAL HIP ARTHROPLASTY Right 05/12/2015   Procedure: TOTAL HIP ARTHROPLASTY ANTERIOR APPROACH;  Surgeon: Hessie Knows, MD;  Location: [facility];  Service: Orthopedics;  Laterality: Right;   TOTAL KNEE REVISION Right 09/22/2015   Procedure: TOTAL KNEE REVISION/ REVISE POLYIETHYLENE;  Surgeon: Hessie Knows, MD;  Location: [facility];  Service: Orthopedics;  Laterality: Right;   TOTAL KNEE REVISION Right 10/11/2016   Procedure: TOTAL KNEE REVISION;  Surgeon: Hessie Knows, MD;  Location: [facility];  Service: Orthopedics;  Laterality: Right;   TOTAL KNEE REVISION Right 02/11/2021   Procedure: TOTAL KNEE REVISION;  Surgeon: Hessie Knows, MD;  Location: [facility];  Service: Orthopedics;  Laterality: Right;   TUBAL LIGATION     VENTRAL HERNIA REPAIR N/A 11/08/2018   Procedure: HERNIA REPAIR VENTRAL ADULT OPEN. DIABETIC, SLEEP APNEA;  Surgeon: Jules Husbands, MD;  Location: [facility];  Service: General;  Laterality: N/A;    Family History  Problem Relation Age of Onset   Diabetes Mother    Heart disease Father    Heart attack Father    Diabetes Sister    Breast cancer Maternal Aunt        <50   Breast cancer Paternal Aunt        x2.  <50   Healthy Son    Drug abuse Sister     Social History   Tobacco Use   Smoking status: Every Day    Packs/day: 0.50    Years: 37.00    Total pack years: 18.50     Types: Cigarettes    Start date: 11/26/1979   Smokeless tobacco: Never  Substance Use Topics   Alcohol use: No    Alcohol/week: 0.0 standard drinks of alcohol    Comment: but used to be a heavy drinker, quit after DUI 2021     Current Outpatient Medications:    albuterol (VENTOLIN HFA) 108 (90 Base) MCG/ACT inhaler, INHALE 2 PUFFS INTO THE LUNGS EVERY 6 HOURS AS NEEDED FOR WHEEZING OR SHORTNESS OF BREATH, Disp: 8.5 g, Rfl: 0   amphetamine-dextroamphetamine (ADDERALL) 15 MG tablet, Take 15 mg by mouth daily in the afternoon., Disp: , Rfl:    amphetamine-dextroamphetamine (ADDERALL) 30 MG tablet, Take 30 mg by mouth 2 (two) times daily., Disp: , Rfl:    Aspirin-Caffeine (BC FAST PAIN RELIEF ARTHRITIS PO), Take 1 packet by mouth 2 (two) times daily as needed (pain)., Disp: , Rfl:    betamethasone valerate (VALISONE) 0.1 % cream, Apply topically 2 (two) times daily., Disp: 30 g, Rfl: 0   busPIRone (BUSPAR) 30 MG tablet, Take 30 mg by mouth 2 (two) times daily. , Disp: , Rfl:    Calcium Carb-Cholecalciferol (CALCIUM 600 + D PO), Take 1 tablet by mouth 2 (two) times daily., Disp: , Rfl:    clonazePAM (KLONOPIN) 1 MG tablet, Take 1-2 mg by mouth 6 (six) times daily., Disp: , Rfl:    cyclobenzaprine (FLEXERIL) 10 MG tablet, Take 1 tablet (10 mg total) by mouth 2 (two) times daily as needed for muscle spasms., Disp: 60 tablet, Rfl: 3   dexlansoprazole (DEXILANT) 60 MG capsule, TAKE 1 CAPSULE BY MOUTH DAILY, Disp: 90 capsule, Rfl: 0   Fluticasone-Umeclidin-Vilant (TRELEGY ELLIPTA) 100-62.5-25 MCG/ACT AEPB, Inhale 1 puff into the lungs daily., Disp: 1 each, Rfl: 11   hydrOXYzine (ATARAX) 50 MG tablet, Take by mouth., Disp: , Rfl:    ibuprofen (ADVIL) 800 MG tablet, TAKE 1 TABLET BY MOUTH EVERY 8 HOURS AS NEEDED, Disp: 90 tablet, Rfl: 0   LATUDA 80 MG TABS tablet, Take 80 mg by mouth daily., Disp: , Rfl:    naloxone (NARCAN) nasal spray 4 mg/0.1 mL, As directed for opioid induced respiratory  depression, Disp: 1 each, Rfl: 1   Oxycodone HCl 10 MG TABS, Take 1 tablet (10 mg total) by mouth every 4 (four) hours., Disp: 150 tablet, Rfl: 0   [START ON 07/01/2022] Oxycodone HCl 10 MG TABS, Take 1 tablet (10 mg total) by mouth every 4 (four) hours., Disp: 150 tablet, Rfl: 0   phenazopyridine (PYRIDIUM) 100 MG tablet, Take 1 tablet (100 mg total) by mouth  3 (three) times daily as needed for pain., Disp: 10 tablet, Rfl: 0   Podiatric Products (GOLD BOND FOOT EX), Apply 1 application topically daily., Disp: , Rfl:    sertraline (ZOLOFT) 100 MG tablet, Take 200 mg by mouth daily., Disp: , Rfl:    traZODone (DESYREL) 100 MG tablet, Take 400 mg by mouth at bedtime. , Disp: , Rfl:    valsartan (DIOVAN) 80 MG tablet, TAKE 1 TABLET BY MOUTH DAILY, Disp: 30 tablet, Rfl: 0  Current Facility-Administered Medications:    ipratropium-albuterol (DUONEB) 0.5-2.5 (3) MG/3ML nebulizer solution 3 mL, 3 mL, Nebulization, Q6H PRN, Devona Konig A, MD, 3 mL at 06/17/21 1039  Allergies  Allergen Reactions   Codeine Nausea Only   Imitrex [Sumatriptan] Other (See Comments)    chest pain    I personally reviewed active problem list, medication list, allergies, family history, social history, health maintenance with the patient/caregiver today.   ROS  ***  Objective  There were no vitals filed for this visit.  There is no height or weight on file to calculate BMI.  Physical Exam ***  No results found for this or any previous visit (from the past 2160 hour(s)).   PHQ2/9:    03/21/2022   11:35 AM 02/03/2022    1:39 PM 10/05/2021    1:26 PM 09/10/2021    2:16 PM 08/24/2021    2:03 PM  Depression screen PHQ 2/9  Decreased Interest 1 0 0 0 0  Down, Depressed, Hopeless 1 0 0 1 0  PHQ - 2 Score 2 0 0 1 0  Altered sleeping 0      Tired, decreased energy 1      Change in appetite 0      Feeling bad or failure about yourself  0      Trouble concentrating 1      Moving slowly or fidgety/restless 0       Suicidal thoughts 0      PHQ-9 Score 4      Difficult doing work/chores Not difficult at all        phq 9 is {gen pos [id number]   Fall Risk:    03/21/2022   10:58 AM 02/03/2022    1:38 PM 10/05/2021    1:25 PM 09/10/2021    2:16 PM 08/24/2021    2:03 PM  Fall Risk   Falls in the past year? 0 0 0 0 0  Number falls in past yr: 0   0 0  Injury with Fall? 0   0 0  Risk for fall due to :    No Fall Risks   Follow up Falls evaluation completed   Falls prevention discussed Falls evaluation completed      Functional Status Survey:      Assessment & Plan  *** There are no diagnoses linked to this encounter.

## 2022-06-21 ENCOUNTER — Ambulatory Visit: Payer: 59 | Admitting: Family Medicine

## 2022-07-01 DIAGNOSIS — Z96651 Presence of right artificial knee joint: Secondary | ICD-10-CM | POA: Diagnosis not present

## 2022-07-01 DIAGNOSIS — R531 Weakness: Secondary | ICD-10-CM | POA: Diagnosis not present

## 2022-07-01 DIAGNOSIS — G4733 Obstructive sleep apnea (adult) (pediatric): Secondary | ICD-10-CM | POA: Diagnosis not present

## 2022-07-08 DIAGNOSIS — G4733 Obstructive sleep apnea (adult) (pediatric): Secondary | ICD-10-CM | POA: Diagnosis not present

## 2022-07-26 ENCOUNTER — Ambulatory Visit: Payer: 59 | Admitting: Anesthesiology

## 2022-07-26 ENCOUNTER — Encounter: Payer: Self-pay | Admitting: Anesthesiology

## 2022-07-26 ENCOUNTER — Ambulatory Visit: Payer: 59 | Attending: Anesthesiology | Admitting: Anesthesiology

## 2022-07-26 DIAGNOSIS — M545 Low back pain, unspecified: Secondary | ICD-10-CM

## 2022-07-26 DIAGNOSIS — T8484XS Pain due to internal orthopedic prosthetic devices, implants and grafts, sequela: Secondary | ICD-10-CM

## 2022-07-26 DIAGNOSIS — M5386 Other specified dorsopathies, lumbar region: Secondary | ICD-10-CM

## 2022-07-26 DIAGNOSIS — M47816 Spondylosis without myelopathy or radiculopathy, lumbar region: Secondary | ICD-10-CM | POA: Diagnosis not present

## 2022-07-26 DIAGNOSIS — M25552 Pain in left hip: Secondary | ICD-10-CM | POA: Diagnosis not present

## 2022-07-26 DIAGNOSIS — M5431 Sciatica, right side: Secondary | ICD-10-CM

## 2022-07-26 DIAGNOSIS — M5136 Other intervertebral disc degeneration, lumbar region: Secondary | ICD-10-CM | POA: Diagnosis not present

## 2022-07-26 DIAGNOSIS — M25551 Pain in right hip: Secondary | ICD-10-CM

## 2022-07-26 DIAGNOSIS — M542 Cervicalgia: Secondary | ICD-10-CM | POA: Diagnosis not present

## 2022-07-26 DIAGNOSIS — F1190 Opioid use, unspecified, uncomplicated: Secondary | ICD-10-CM

## 2022-07-26 DIAGNOSIS — G894 Chronic pain syndrome: Secondary | ICD-10-CM | POA: Diagnosis not present

## 2022-07-26 MED ORDER — OXYCODONE HCL 10 MG PO TABS: 10.0000 mg | ORAL_TABLET | ORAL | 0 refills | Status: DC

## 2022-07-26 MED ORDER — OXYCODONE HCL 10 MG PO TABS: 10.0000 mg | ORAL_TABLET | ORAL | 0 refills | Status: AC

## 2022-07-26 NOTE — Progress Notes (Signed)
Virtual Visit via Telephone Note  I connected with Wendy Johnson [place] on 07/26/22 at  1:20 PM EDT by telephone and verified that I am speaking with the correct person using two identifiers.  Location: Patient: Home Provider: Pain control center   I discussed the limitations, risks, security and privacy concerns of performing an evaluation and management service by telephone and the availability of in person appointments. I also discussed with the patient that there may be a patient responsible charge related to this service. The patient expressed understanding and agreed to proceed.   History of Present Illness: I spoke at length with Wendy Johnson for her virtual visit.  We are unable to do that with the video portion.  She is mention that she is recovering from [name] and has had quite about without.  She is feeling better and is still having a considerable amount of low back pain.  The pain she describes as an gnawing aching pain worse with prolonged standing or any type of activity that radiates from the right lower back into the right hip buttock region.  She occasionally gets some right side sciatica but this is less prevalent at this point.  She does mention that because of the recent infection and some dieting she has been able to lose 50 pounds.  She still takes her medications as prescribed and these continue to give her good relief rated 50 to 75% relief lasting about 4 to 6 hours.  She takes her oxycodone approximately 5 times a day on average.  Without medication management she maintains that she would be incapacitated and unable to retain activity or sleep at night.  With the medicines she is much more comfortable but is having more pain than usual recently.  She is scheduled currently for an epidural injection here within the next 3 weeks.  Otherwise no change in lower extremity strength function bowel or bladder function is noted.  Review of systems: General: No fevers or chills Pulmonary: No  shortness of breath or dyspnea Cardiac: No angina or palpitations or lightheadedness GI: No abdominal pain or constipation Psych: No depression    Observations/Objective:  Current Outpatient Medications:    [START ON 08/30/2022] Oxycodone HCl 10 MG TABS, Take 1 tablet (10 mg total) by mouth every 4 (four) hours., Disp: 150 tablet, Rfl: 0   albuterol (VENTOLIN HFA) 108 (90 Base) MCG/ACT inhaler, INHALE 2 PUFFS INTO THE LUNGS EVERY 6 HOURS AS NEEDED FOR WHEEZING OR SHORTNESS OF BREATH, Disp: 8.5 g, Rfl: 0   amphetamine-dextroamphetamine (ADDERALL) 15 MG tablet, Take 15 mg by mouth daily in the afternoon., Disp: , Rfl:    amphetamine-dextroamphetamine (ADDERALL) 30 MG tablet, Take 30 mg by mouth 2 (two) times daily., Disp: , Rfl:    Aspirin-Caffeine (BC FAST PAIN RELIEF ARTHRITIS PO), Take 1 packet by mouth 2 (two) times daily as needed (pain)., Disp: , Rfl:    betamethasone valerate (VALISONE) 0.1 % cream, Apply topically 2 (two) times daily., Disp: 30 g, Rfl: 0   busPIRone (BUSPAR) 30 MG tablet, Take 30 mg by mouth 2 (two) times daily. , Disp: , Rfl:    Calcium Carb-Cholecalciferol (CALCIUM 600 + D PO), Take 1 tablet by mouth 2 (two) times daily., Disp: , Rfl:    clonazePAM (KLONOPIN) 1 MG tablet, Take 1-2 mg by mouth 6 (six) times daily., Disp: , Rfl:    cyclobenzaprine (FLEXERIL) 10 MG tablet, Take 1 tablet (10 mg total) by mouth 2 (two) times daily as needed for  muscle spasms., Disp: 60 tablet, Rfl: 3   dexlansoprazole (DEXILANT) 60 MG capsule, TAKE 1 CAPSULE BY MOUTH DAILY, Disp: 90 capsule, Rfl: 0   Fluticasone-Umeclidin-Vilant (TRELEGY ELLIPTA) 100-62.5-25 MCG/ACT AEPB, Inhale 1 puff into the lungs daily., Disp: 1 each, Rfl: 11   hydrOXYzine (ATARAX) 50 MG tablet, Take by mouth., Disp: , Rfl:    ibuprofen (ADVIL) 800 MG tablet, TAKE 1 TABLET BY MOUTH EVERY 8 HOURS AS NEEDED, Disp: 90 tablet, Rfl: 0   LATUDA 80 MG TABS tablet, Take 80 mg by mouth daily., Disp: , Rfl:    naloxone (NARCAN)  nasal spray 4 mg/0.1 mL, As directed for opioid induced respiratory depression, Disp: 1 each, Rfl: 1   [START ON 07/30/2022] Oxycodone HCl 10 MG TABS, Take 1 tablet (10 mg total) by mouth every 4 (four) hours., Disp: 150 tablet, Rfl: 0   phenazopyridine (PYRIDIUM) 100 MG tablet, Take 1 tablet (100 mg total) by mouth 3 (three) times daily as needed for pain., Disp: 10 tablet, Rfl: 0   Podiatric Products (GOLD BOND FOOT EX), Apply 1 application topically daily., Disp: , Rfl:    sertraline (ZOLOFT) 100 MG tablet, Take 200 mg by mouth daily., Disp: , Rfl:    traZODone (DESYREL) 100 MG tablet, Take 400 mg by mouth at bedtime. , Disp: , Rfl:    valsartan (DIOVAN) 80 MG tablet, TAKE 1 TABLET BY MOUTH DAILY, Disp: 30 tablet, Rfl: 0  Current Facility-Administered Medications:    ipratropium-albuterol (DUONEB) 0.5-2.5 (3) MG/3ML nebulizer solution 3 mL, 3 mL, Nebulization, Q6H PRN, Allyne Gee, MD, 3 mL at 06/17/21 1039   Past Medical History:  Diagnosis Date   ADHD (attention deficit hyperactivity disorder)    Anemia 09/10/2021   Anxiety    AR (allergic rhinitis)    Arthritis    Benign hypertension    NO MEDS   Bipolar disorder (HCC)    Chronic back pain    Chronic constipation    Chronic insomnia    COPD (chronic obstructive pulmonary disease) (HCC)    Deaf    RIGHT EAR   Decreased dorsalis pedis pulse    Depression    Dyslipidemia    Fatty liver    GERD (gastroesophageal reflux disease)    Hepatomegaly    High cholesterol    Hot flashes    Migraine with aura    OCD (obsessive compulsive disorder)    Pain in joint, shoulder region 12/19/2018   Plantar warts    Sciatica, right side 02/18/2019   Severe obesity (HCC)    Shortness of breath dyspnea    Sleep apnea    NO CPAP   Tobacco use    Trochanteric bursitis of right hip      Assessment and Plan: 1. DDD (degenerative disc disease), lumbar   2. Sciatica, right side   3. Chronic pain syndrome   4. Pain of both hip joints    5. Chronic, continuous use of opioids   6. Low back pain at multiple sites   7. Cervicalgia   8. Pain due to total right knee replacement, sequela   9. Pain of right hip joint   10. Low back derangement syndrome   11. Facet arthritis of lumbar region   Based on our conversation I think is appropriate to schedule her for a repeat injection at this next visit in 3 weeks.  Currently she is scheduled for an epidural injection which I think would be reasonable.  Yet, she may benefit from  a right-sided facet injection based on her symptoms.  This would be for diagnostic and therapeutic purpose.  I want her to continue with her current medication management as it continues to keep her pain under reasonable control and she denies side effects with medication.  Continue efforts at weight loss stretching strengthening and recovery from her recent COVID infection.  I have reviewed her most recent MRI with her additionally and there is evidence of facet degeneration and I therefore think a facet medial branch block would be warranted for diagnostic and hopefully therapeutic purpose.  Continue follow-up with her primary care physicians for baseline medical care and I have encouraged her to discontinue smoking additionally.  Follow Up Instructions:    I discussed the assessment and treatment plan with the patient. The patient was provided an opportunity to ask questions and all were answered. The patient agreed with the plan and demonstrated an understanding of the instructions.   The patient was advised to call back or seek an in-person evaluation if the symptoms worsen or if the condition fails to improve as anticipated.  I provided 30 minutes of non-face-to-face time during this encounter.   Molli Barrows, MD

## 2022-07-30 ENCOUNTER — Other Ambulatory Visit: Payer: Self-pay | Admitting: Family Medicine

## 2022-07-30 DIAGNOSIS — Z96651 Presence of right artificial knee joint: Secondary | ICD-10-CM | POA: Diagnosis not present

## 2022-07-30 DIAGNOSIS — R531 Weakness: Secondary | ICD-10-CM | POA: Diagnosis not present

## 2022-07-30 DIAGNOSIS — K219 Gastro-esophageal reflux disease without esophagitis: Secondary | ICD-10-CM

## 2022-07-30 DIAGNOSIS — G4733 Obstructive sleep apnea (adult) (pediatric): Secondary | ICD-10-CM | POA: Diagnosis not present

## 2022-08-01 ENCOUNTER — Telehealth: Payer: Self-pay
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
## 2022-08-02 NOTE — Telephone Encounter (Signed)
Message sent to Dr Adams.  

## 2022-08-09 ENCOUNTER — Ambulatory Visit: Payer: 59 | Admitting: Anesthesiology

## 2022-08-11 MED ORDER — OXYCODONE HCL 10 MG PO TABS: 10.0000 mg | ORAL_TABLET | Freq: Four times a day (QID) | ORAL | 0 refills | Status: AC

## 2022-08-11 NOTE — Addendum Note (Signed)
Addended by: Molli Barrows on: 08/11/2022 08:02 AM   Modules accepted: Orders

## 2022-08-12 ENCOUNTER — Telehealth: Payer: Self-pay

## 2022-08-22 ENCOUNTER — Ambulatory Visit: Payer: 59 | Admitting: Anesthesiology

## 2022-08-23 DIAGNOSIS — G4733 Obstructive sleep apnea (adult) (pediatric): Secondary | ICD-10-CM | POA: Diagnosis not present

## 2022-09-12 ENCOUNTER — Emergency Department: Payer: 59

## 2022-09-12 ENCOUNTER — Other Ambulatory Visit: Payer: Self-pay

## 2022-09-12 ENCOUNTER — Emergency Department
Admission: EM | Admit: 2022-09-12 | Discharge: 2022-09-12 | Disposition: A | Discharge status: Home / Self Care | Payer: 59 | Attending: Emergency Medicine | Admitting: Emergency Medicine

## 2022-09-12 DIAGNOSIS — J181 Lobar pneumonia, unspecified organism: Secondary | ICD-10-CM | POA: Diagnosis not present

## 2022-09-12 DIAGNOSIS — R0602 Shortness of breath: Secondary | ICD-10-CM | POA: Diagnosis not present

## 2022-09-12 DIAGNOSIS — Z79899 Other long term (current) drug therapy: Secondary | ICD-10-CM | POA: Insufficient documentation

## 2022-09-12 DIAGNOSIS — Z743 Need for continuous supervision: Secondary | ICD-10-CM | POA: Diagnosis not present

## 2022-09-12 DIAGNOSIS — R1013 Epigastric pain: Secondary | ICD-10-CM | POA: Diagnosis not present

## 2022-09-12 DIAGNOSIS — K3189 Other diseases of stomach and duodenum: Secondary | ICD-10-CM | POA: Diagnosis not present

## 2022-09-12 DIAGNOSIS — J189 Pneumonia, unspecified organism: Secondary | ICD-10-CM

## 2022-09-12 DIAGNOSIS — I10 Essential (primary) hypertension: Secondary | ICD-10-CM | POA: Insufficient documentation

## 2022-09-12 DIAGNOSIS — R58 Hemorrhage, not elsewhere classified: Secondary | ICD-10-CM | POA: Diagnosis not present

## 2022-09-12 DIAGNOSIS — J449 Chronic obstructive pulmonary disease, unspecified: Secondary | ICD-10-CM | POA: Insufficient documentation

## 2022-09-12 DIAGNOSIS — K760 Fatty (change of) liver, not elsewhere classified: Secondary | ICD-10-CM | POA: Diagnosis not present

## 2022-09-12 DIAGNOSIS — R1111 Vomiting without nausea: Secondary | ICD-10-CM | POA: Diagnosis not present

## 2022-09-12 DIAGNOSIS — R109 Unspecified abdominal pain: Secondary | ICD-10-CM | POA: Diagnosis not present

## 2022-09-12 DIAGNOSIS — R079 Chest pain, unspecified: Secondary | ICD-10-CM | POA: Diagnosis not present

## 2022-09-12 DIAGNOSIS — R0689 Other abnormalities of breathing: Secondary | ICD-10-CM | POA: Diagnosis not present

## 2022-09-12 LAB — CBC
HCT: 45.9 % (ref 36.0–46.0)
Hemoglobin: 15.3 g/dL — ABNORMAL HIGH (ref 12.0–15.0)
MCH: 29.4 pg (ref 26.0–34.0)
MCHC: 33.3 g/dL (ref 30.0–36.0)
MCV: 88.1 fL (ref 80.0–100.0)
Platelets: 318 10*3/uL (ref 150–400)
RBC: 5.21 MIL/uL — ABNORMAL HIGH (ref 3.87–5.11)
RDW: 14.3 % (ref 11.5–15.5)
WBC: 5.1 10*3/uL (ref 4.0–10.5)
nRBC: 0 % (ref 0.0–0.2)

## 2022-09-12 LAB — COMPREHENSIVE METABOLIC PANEL
ALT: 17 U/L (ref 0–44)
AST: 26 U/L (ref 15–41)
Albumin: 3.9 g/dL (ref 3.5–5.0)
Alkaline Phosphatase: 78 U/L (ref 38–126)
Anion gap: 13 (ref 5–15)
BUN: 7 mg/dL (ref 6–20)
CO2: 24 mmol/L (ref 22–32)
Calcium: 9.3 mg/dL (ref 8.9–10.3)
Chloride: 103 mmol/L (ref 98–111)
Creatinine, Ser: 0.49 mg/dL (ref 0.44–1.00)
GFR, Estimated: >60 mL/min (ref >60)
Glucose, Bld: 81 mg/dL (ref 70–99)
Potassium: 3.4 mmol/L — ABNORMAL LOW (ref 3.5–5.1)
Sodium: 140 mmol/L (ref 135–145)
Total Bilirubin: 1 mg/dL (ref 0.3–1.2)
Total Protein: 7.4 g/dL (ref 6.5–8.1)

## 2022-09-12 LAB — LIPASE, BLOOD: Lipase: 28 U/L (ref 11–51)

## 2022-09-12 MED ORDER — IOHEXOL 300 MG/ML  SOLN
100.0000 mL | Freq: Once | INTRAMUSCULAR | Status: AC | PRN
  Administered 2022-09-12: 100 mL via INTRAVENOUS

## 2022-09-12 MED ORDER — ONDANSETRON HCL 4 MG/2ML IJ SOLN
4.0000 mg | Freq: Once | INTRAMUSCULAR | Status: AC
  Administered 2022-09-12: 4 mg via INTRAVENOUS
  Filled 2022-09-12: qty 2

## 2022-09-12 MED ORDER — PANTOPRAZOLE SODIUM 40 MG IV SOLR
40.0000 mg | Freq: Once | INTRAVENOUS | Status: AC
  Administered 2022-09-12: 40 mg via INTRAVENOUS
  Filled 2022-09-12: qty 10

## 2022-09-12 MED ORDER — DOXYCYCLINE HYCLATE 100 MG PO TABS
100.0000 mg | ORAL_TABLET | Freq: Once | ORAL | Status: AC
  Administered 2022-09-12: 100 mg via ORAL
  Filled 2022-09-12: qty 1

## 2022-09-12 MED ORDER — SUCRALFATE 1 G PO TABS: 1.0000 g | ORAL_TABLET | Freq: Four times a day (QID) | ORAL | 0 refills | Status: DC

## 2022-09-12 MED ORDER — AMOXICILLIN-POT CLAVULANATE 875-125 MG PO TABS: 1.0000 | ORAL_TABLET | Freq: Two times a day (BID) | ORAL | 0 refills | Status: DC

## 2022-09-12 MED ORDER — AMOXICILLIN-POT CLAVULANATE 875-125 MG PO TABS
1.0000 | ORAL_TABLET | Freq: Once | ORAL | Status: AC
  Administered 2022-09-12: 1 via ORAL
  Filled 2022-09-12: qty 1

## 2022-09-12 MED ORDER — AMOXICILLIN-POT CLAVULANATE 875-125 MG PO TABS: 1.0000 | ORAL_TABLET | Freq: Two times a day (BID) | ORAL | 0 refills | Status: AC

## 2022-09-12 MED ORDER — LACTATED RINGERS IV BOLUS
1000.0000 mL | Freq: Once | INTRAVENOUS | Status: AC
  Administered 2022-09-12: 1000 mL via INTRAVENOUS

## 2022-09-12 MED ORDER — DOXYCYCLINE MONOHYDRATE 100 MG PO TABS: 100.0000 mg | ORAL_TABLET | Freq: Two times a day (BID) | ORAL | 0 refills | Status: DC

## 2022-09-12 MED ORDER — ALUM & MAG HYDROXIDE-SIMETH 200-200-20 MG/5ML PO SUSP
30.0000 mL | Freq: Once | ORAL | Status: AC
  Administered 2022-09-12: 30 mL via ORAL
  Filled 2022-09-12: qty 30

## 2022-09-12 MED ORDER — DOXYCYCLINE MONOHYDRATE 100 MG PO TABS: 100.0000 mg | ORAL_TABLET | Freq: Two times a day (BID) | ORAL | 0 refills | Status: AC

## 2022-09-12 MED ORDER — MORPHINE SULFATE (PF) 4 MG/ML IV SOLN
4.0000 mg | Freq: Once | INTRAVENOUS | Status: AC
  Administered 2022-09-12: 4 mg via INTRAVENOUS
  Filled 2022-09-12: qty 1

## 2022-09-12 NOTE — ED Notes (Signed)
Pt to CT at this time.

## 2022-09-12 NOTE — ED Triage Notes (Addendum)
Pt comes via EMS with c/o rectal pain and bleeding. Pt also vomiting blood. Pt states bright red blood. Pt states this all started two week ago. Pt does drink heavily 40 a day and has hx of ulcers and hernia.   Pt not on thinners.   Pt given 100 fen, 4 of zofran on board via EMS

## 2022-09-12 NOTE — ED Provider Notes (Signed)
[facility] Provider Note    Event Date/Time   First MD Initiated Contact with Patient 09/12/22 1342     (approximate)   History   Abdominal Pain   HPI  Wendy Johnson is a 57 y.o. female with past medical history of COPD, chronic alcohol use disorder, bipolar disorder, hypertension who presents because of vomiting blood.  Patient tells me that all last week she was coughing up blood.  This has resolved but last night she started vomiting blood.  Says she started out as dark brown and then turned to bright red.  She has not vomited this morning.  Did say when she used the bathroom there was bright red blood with wiping but no melena has not had bowel movement today.  She endorses epigastric discomfort since last night.  Denies shortness of breath or ongoing cough no fevers or chills.  She does continue to drink alcohol daily.     Past Medical History:  Diagnosis Date   ADHD (attention deficit hyperactivity disorder)    Anemia 09/10/2021   Anxiety    AR (allergic rhinitis)    Arthritis    Benign hypertension    NO MEDS   Bipolar disorder (HCC)    Chronic back pain    Chronic constipation    Chronic insomnia    COPD (chronic obstructive pulmonary disease) (HCC)    Deaf    RIGHT EAR   Decreased dorsalis pedis pulse    Depression    Dyslipidemia    Fatty liver    GERD (gastroesophageal reflux disease)    Hepatomegaly    High cholesterol    Hot flashes    Migraine with aura    OCD (obsessive compulsive disorder)    Pain in joint, shoulder region 12/19/2018   Plantar warts    Sciatica, right side 02/18/2019   Severe obesity (HCC)    Shortness of breath dyspnea    Sleep apnea    NO CPAP   Tobacco use    Trochanteric bursitis of right hip     Patient Active Problem List   Diagnosis Date Noted   Left-sided chest wall pain 09/10/2021   Eczema of external ear, right 09/10/2021   Headache, chronic daily 09/10/2021   Hypertension, benign  09/10/2021   S/P revision of total knee, right 02/11/2021   Esophageal dysphagia    History of colonic polyps    Sciatica, right side 02/18/2019   Pain in joint, shoulder region 12/19/2018   Unspecified inflammatory spondylopathy, lumbar region (HCC) 10/04/2018   BMI 40.0-44.9, adult (HCC) 08/17/2018   Lumbar spondylosis 03/19/2018   Polyarthralgia 03/19/2018   History of alcoholism (HCC) 07/19/2017   Bilateral carpal tunnel syndrome 05/29/2017   Painful total knee replacement, right (HCC) 10/11/2016   Rotator cuff tendinitis, right 07/29/2016   Occipital neuralgia 10/12/2015   Instability of prosthetic knee (HCC) 09/22/2015   Primary osteoarthritis of right hip 05/12/2015   Sleep apnea 04/07/2015   Primary osteoarthritis of left hip 12/16/2014   Metabolic syndrome 11/26/2014   Migraine without aura and with status migrainosus, not intractable 11/26/2014   Gastroesophageal reflux disease without esophagitis 11/26/2014   COPD, moderate (HCC) 11/26/2014   Nocturnal oxygen desaturation 11/26/2014   Supplemental oxygen dependent 11/26/2014   Hearing loss 11/26/2014   Pain syndrome, chronic 11/26/2014   History of hypertension 11/26/2014   Dyslipidemia 11/26/2014   Morbid obesity (HCC) 11/26/2014   Chronic constipation 11/26/2014   Generalized anxiety disorder 11/11/2014  DDD (degenerative disc disease), lumbar 11/11/2014   Facet arthritis of lumbar region 11/11/2014   Primary osteoarthritis involving multiple joints 11/11/2014   H/O hysterectomy for benign disease 10/20/2014   Low back derangement syndrome 09/30/2014   Bipolar disorder (HCC) 09/30/2014     Physical Exam  Triage Vital Signs: ED Triage Vitals  Enc Vitals Group     BP 09/12/22 1129 (!) 149/94     Pulse Rate 09/12/22 1129 85     Resp 09/12/22 1129 16     Temp 09/12/22 1129 98.2 F (36.8 C)     Temp Source 09/12/22 1129 Oral     SpO2 09/12/22 1129 97 %     Weight --      Height --      Head Circumference  --      Peak Flow --      Pain Score 09/12/22 1102 10     Pain Loc --      Pain Edu? --      Excl. in GC? --     Most recent vital signs: Vitals:   09/12/22 1400 09/12/22 1418  BP: (!) 156/108   Pulse: 95 88  Resp: 15   Temp:    SpO2: 96% 91%     General: Awake, no distress.  CV:  Good peripheral perfusion.  Resp:  Normal effort.  Abd:  No distention.  Mild tenderness in the epigastric region but abdomen is soft Neuro:             Awake, Alert, Oriented x 3  Other:  Brown stool on rectal exam   ED Results / Procedures / Treatments  Labs (all labs ordered are listed, but only abnormal results are displayed) Labs Reviewed  COMPREHENSIVE METABOLIC PANEL - Abnormal; Notable for the following components:      Result Value   Potassium 3.4 (*)    All other components within normal limits  CBC - Abnormal; Notable for the following components:   RBC 5.21 (*)    Hemoglobin 15.3 (*)    All other components within normal limits  LIPASE, BLOOD  URINALYSIS, ROUTINE W REFLEX MICROSCOPIC     EKG  EKG interpretation performed by myself: NSR, nml axis, prolonged qtc, no acute ischemic changes    RADIOLOGY I reviewed interpreted the chest triage shows possible right-sided pneumonia   PROCEDURES:  Critical Care performed: No  Procedures  The patient is on the cardiac monitor to evaluate for evidence of arrhythmia and/or significant heart rate changes.   MEDICATIONS ORDERED IN ED: Medications  lactated ringers bolus 1,000 mL (0 mLs Intravenous Stopped 09/12/22 1607)  pantoprazole (PROTONIX) injection 40 mg (40 mg Intravenous Given 09/12/22 1435)  morphine (PF) 4 MG/ML injection 4 mg (4 mg Intravenous Given 09/12/22 1435)  iohexol (OMNIPAQUE) 300 MG/ML solution 100 mL (100 mLs Intravenous Contrast Given 09/12/22 1442)  alum & mag hydroxide-simeth (MAALOX/MYLANTA) 200-200-20 MG/5ML suspension 30 mL (30 mLs Oral Given 09/12/22 1623)  ondansetron (ZOFRAN) injection 4 mg (4 mg  Intravenous Given 09/12/22 1623)  amoxicillin-clavulanate (AUGMENTIN) 875-125 MG per tablet 1 tablet (1 tablet Oral Given 09/12/22 1628)  doxycycline (VIBRA-TABS) tablet 100 mg (100 mg Oral Given 09/12/22 1628)     IMPRESSION / MDM / ASSESSMENT AND PLAN / ED COURSE  I reviewed the triage vital signs and the nursing notes.  Patient's presentation is most consistent with acute presentation with potential threat to life or bodily function.  Differential diagnosis includes, but is not limited to, upper GI bleed secondary to ulcer, gastritis, esophagitis, esophageal varices, malingering, electrolyte abnormality  The patient is a 57 year old female with history of heavy alcohol use who presents because of GI bleeding.  Patient tells me she had a mopped assist, was coughing up blood for the last week but then last night was vomiting and had more episodes that she can count of emesis.  Initially was brown and then says it turned bright red.  She also said there was bright red blood per rectum.  She is denying any ongoing hemoptysis chest pain or dyspnea.  Does endorse epigastric discomfort since last night.  Does drink alcohol.  Patient is hypertensive vitals otherwise stable.  Sats were borderline in the low 90s but patient has COPD and OSA.  When she is awake and sitting up they are above 94%.  Labs show actually elevated hemoglobin from a baseline of 15 be in part hemoconcentration but her white count and platelets are normal so there is not likely to be a large component of hemoconcentration.  LFTs and lipase are normal.  BUN is reassuringly normal.  I did obtain a CT of the abdomen pelvis given prominent epigastric pain there is a dilated loop of jejunum which could represent ileus.  There is also some stomach thickening which could be gastritis versus just under distention.  Given the hemoptysis also obtain chest x-ray which shows possible right-sided infiltrate which could  be consistent with aspiration.  Given patient was having some hemoptysis will treat with Augmentin and Doxy given her comorbidities. Spent patient is tolerating p.o.  Continues to endorse epigastric pain has not had any additional episodes of vomiting or hematemesis or bloody stool since last night.  Given stable hemoglobin and no melena and stable vital signs I do think that patient can be discharged.  Patient is already on PPI.  I have prescribed Carafate in addition.  Will refer to GI.        FINAL CLINICAL IMPRESSION(S) / ED DIAGNOSES   Final diagnoses:  Epigastric pain  Pneumonia of right middle lobe due to infectious organism     Rx / DC Orders   ED Discharge Orders          Ordered    amoxicillin-clavulanate (AUGMENTIN) 875-125 MG tablet  2 times daily        09/12/22 1642    doxycycline (ADOXA) 100 MG tablet  2 times daily        09/12/22 1642    sucralfate (CARAFATE) 1 g tablet  4 times daily        09/12/22 1643             Note:  This document was prepared using Dragon voice recognition software and may include unintentional dictation errors.   Georga Hacking, MD 09/12/22 [phone]

## 2022-09-12 NOTE — Discharge Instructions (Addendum)
You likely have an aspiration pneumonia.  Please take the 2 antibiotics twice a day for the next 5 days.  Continue to take the Dexilant for acid production.  You can also take the Carafate for pain.  Please avoid taking any NSAIDs such as ibuprofen Aleve Motrin etc.  Cut back on alcohol if you can.  It is important that you follow-up with a gastroenterologist.  If you start having black stools or have additional episodes of bloody vomit then please return to the emergency department.

## 2022-09-13 ENCOUNTER — Ambulatory Visit: Payer: 59 | Attending: Anesthesiology | Admitting: Anesthesiology

## 2022-09-16 ENCOUNTER — Telehealth: Payer: Self-pay

## 2022-09-16 NOTE — Telephone Encounter (Signed)
Transition Care Management Unsuccessful Follow-up Telephone Call  Date of discharge and from where:  09/12/2022 [facility]  Attempts:  1st Attempt  Reason for unsuccessful TCM follow-up call:  Left voice message  Rhys Lichty [facility]  [facility] Population Health Community Resource Care Guide   [email or web address]  ?? [phone]   Website: [email or web address]  [email or web address]

## 2022-09-19 ENCOUNTER — Encounter: Payer: 59 | Admitting: Anesthesiology

## 2022-09-21 ENCOUNTER — Telehealth: Payer: Self-pay

## 2022-09-21 NOTE — Telephone Encounter (Signed)
Transition Care Management Unsuccessful Follow-up Telephone Call  Date of discharge and from where:  09/12/2022 [facility]  Attempts:  2nd Attempt  Reason for unsuccessful TCM follow-up call:  Left voice message  Nicklous Aburto [facility]  [facility] Population Health Community Resource Care Guide   [email or web address]  ?? [phone]   Website: [email or web address]  [email or web address]

## 2022-09-27 ENCOUNTER — Telehealth: Payer: Self-pay | Admitting: Anesthesiology

## 2022-09-27 NOTE — Telephone Encounter (Signed)
Called patient.No answer. LVM. Patient has had several no show appointments.If she is calling to reschedule it will need to be cleared with Dr. Pernell Dupre first.

## 2022-09-27 NOTE — Telephone Encounter (Addendum)
[phone]   Patient wants to speak with Dr Pernell Dupre. Would not give me a reason. I explained a nurse would return her call. She says whatever and hung up. She has had 3 no shows and 2 rescheduled for the same procedure.

## 2022-10-12 ENCOUNTER — Telehealth: Payer: Self-pay | Admitting: Anesthesiology

## 2022-10-12 NOTE — Telephone Encounter (Signed)
On 09/27/2022 patient called and I returned her call with no answer, and left a voicemail. Dr. Pernell Dupre is out of town. When he returns I will have to ok with him if he will see her again due to several no shows. Please call patient and explain and let her know Dr. Pernell Dupre is on vacation. Thank you.

## 2022-10-12 NOTE — Telephone Encounter (Signed)
PT called requesting to get an appt with [facility] asap. PT has missed the last couple of appts. I advised the patient that I will have to get approval before I can schedule her an appt. PT stated that that she didn't have any transportation at the time, however she has an ride now. Please give patient a call. TY

## 2022-10-26 ENCOUNTER — Encounter: Payer: Self-pay | Admitting: Anesthesiology

## 2022-10-26 ENCOUNTER — Ambulatory Visit: Payer: 59 | Attending: Anesthesiology | Admitting: Anesthesiology

## 2022-10-26 VITALS — BP 152/99 | HR 105 | Temp 97.3°F | Resp 17 | Ht 64.0 in | Wt 177.0 lb

## 2022-10-26 DIAGNOSIS — Z96651 Presence of right artificial knee joint: Secondary | ICD-10-CM | POA: Insufficient documentation

## 2022-10-26 DIAGNOSIS — M542 Cervicalgia: Secondary | ICD-10-CM | POA: Diagnosis not present

## 2022-10-26 DIAGNOSIS — M7918 Myalgia, other site: Secondary | ICD-10-CM

## 2022-10-26 DIAGNOSIS — T8484XS Pain due to internal orthopedic prosthetic devices, implants and grafts, sequela: Secondary | ICD-10-CM | POA: Diagnosis not present

## 2022-10-26 DIAGNOSIS — M25552 Pain in left hip: Secondary | ICD-10-CM | POA: Insufficient documentation

## 2022-10-26 DIAGNOSIS — M5136 Other intervertebral disc degeneration, lumbar region: Secondary | ICD-10-CM | POA: Diagnosis not present

## 2022-10-26 DIAGNOSIS — M545 Low back pain, unspecified: Secondary | ICD-10-CM | POA: Insufficient documentation

## 2022-10-26 DIAGNOSIS — M5431 Sciatica, right side: Secondary | ICD-10-CM | POA: Diagnosis not present

## 2022-10-26 DIAGNOSIS — M47816 Spondylosis without myelopathy or radiculopathy, lumbar region: Secondary | ICD-10-CM | POA: Diagnosis not present

## 2022-10-26 DIAGNOSIS — F1190 Opioid use, unspecified, uncomplicated: Secondary | ICD-10-CM | POA: Diagnosis present

## 2022-10-26 DIAGNOSIS — G894 Chronic pain syndrome: Secondary | ICD-10-CM | POA: Diagnosis not present

## 2022-10-26 DIAGNOSIS — M25551 Pain in right hip: Secondary | ICD-10-CM | POA: Diagnosis not present

## 2022-10-26 MED ORDER — OXYCODONE HCL 10 MG PO TABS: 10.0000 mg | ORAL_TABLET | Freq: Four times a day (QID) | ORAL | 0 refills | Status: AC

## 2022-10-26 NOTE — Progress Notes (Signed)
Nursing Pain Medication Assessment:  Safety precautions to be maintained throughout the outpatient stay will include: orient to surroundings, keep bed in low position, maintain call bell within reach at all times, provide assistance with transfer out of bed and ambulation.  Medication Inspection Compliance: Wendy Johnson did not comply with our request to bring her pills to be counted. She was reminded that bringing the medication bottles, even when empty, is a requirement.  Medication: None brought in. Pill/Patch Count: None available to be counted. Bottle Appearance: No container available. Did not bring bottle(s) to appointment. Filled Date: N/A Last Medication intake:  Ran out of medicine more than 48 hours ago 

## 2022-10-26 NOTE — Progress Notes (Signed)
Subjective:  Patient ID: Wendy Johnson, female    DOB: [birth year]  Age: 57 y.o. MRN: [id number]  CC: Back Pain   Procedure: None  HPI Wendy Johnson [facility] presents for reevaluation.  Wendy Johnson was last seen several months ago and has had some significant social difficulties.  She reports that she recently broke up with her boyfriend and is now living with her mother.  She went through a phase where she reports that she was essentially homeless.  She missed 2 appointments because friends were unable to drive her to the clinic for evaluation.  Subsequently she has been off her opioid medications for a few months.  She reports that she still has a considerable amount of pain primarily in the right gluteal region and in the low back.  She has had previous injections for this and responded favorably.  Historically, she has been on chronic opioid therapy and done well with this with no evidence of any diverting or illicit use.  She has been reliant on medication for pain relief.  She has failed more conservative therapy and maintains that she got limited benefit from physical therapy stretching or core strengthening.  Conservative medical management with anti-inflammatories was insufficient but she reported good response to opioids and that they enable her to stay functional active sleep better and complete household activities.  Otherwise she is in her usual state of health today and denies any change in lower extremity strength function bowel or bladder function but has significant pain with a spasming type pain noted in the right gluteal region.  Outpatient Medications Prior to Visit  Medication Sig Dispense Refill   albuterol (VENTOLIN HFA) 108 (90 Base) MCG/ACT inhaler INHALE 2 PUFFS INTO THE LUNGS EVERY 6 HOURS AS NEEDED FOR WHEEZING OR SHORTNESS OF BREATH 8.5 g 0   amphetamine-dextroamphetamine (ADDERALL) 15 MG tablet Take 15 mg by mouth daily in the afternoon.     amphetamine-dextroamphetamine (ADDERALL) 30  MG tablet Take 30 mg by mouth 2 (two) times daily.     Aspirin-Caffeine (BC FAST PAIN RELIEF ARTHRITIS PO) Take 1 packet by mouth 2 (two) times daily as needed (pain).     busPIRone (BUSPAR) 30 MG tablet Take 30 mg by mouth 2 (two) times daily.      clonazePAM (KLONOPIN) 1 MG tablet Take 1-2 mg by mouth 6 (six) times daily.     dexlansoprazole (DEXILANT) 60 MG capsule TAKE 1 CAPSULE BY MOUTH DAILY 90 capsule 0   Fluticasone-Umeclidin-Vilant (TRELEGY ELLIPTA) 100-62.5-25 MCG/ACT AEPB Inhale 1 puff into the lungs daily. 1 each 11   hydrOXYzine (ATARAX) 50 MG tablet Take by mouth.     LATUDA 80 MG TABS tablet Take 80 mg by mouth daily.     naloxone (NARCAN) nasal spray 4 mg/0.1 mL As directed for opioid induced respiratory depression 1 each 1   Oxycodone HCl 10 MG TABS Take by mouth every 4 (four) hours as needed.     Podiatric Products (GOLD BOND FOOT EX) Apply 1 application topically daily.     sertraline (ZOLOFT) 100 MG tablet Take 200 mg by mouth daily.     traZODone (DESYREL) 100 MG tablet Take 400 mg by mouth at bedtime.      valsartan (DIOVAN) 80 MG tablet TAKE 1 TABLET BY MOUTH DAILY 30 tablet 0   betamethasone valerate (VALISONE) 0.1 % cream Apply topically 2 (two) times daily. (Patient not taking: Reported on 10/26/2022) 30 g 0   Calcium Carb-Cholecalciferol (CALCIUM 600 + D  PO) Take 1 tablet by mouth 2 (two) times daily. (Patient not taking: Reported on 10/26/2022)     ibuprofen (ADVIL) 800 MG tablet TAKE 1 TABLET BY MOUTH EVERY 8 HOURS AS NEEDED (Patient not taking: Reported on 10/26/2022) 90 tablet 0   phenazopyridine (PYRIDIUM) 100 MG tablet Take 1 tablet (100 mg total) by mouth 3 (three) times daily as needed for pain. (Patient not taking: Reported on 10/26/2022) 10 tablet 0   sucralfate (CARAFATE) 1 g tablet Take 1 tablet (1 g total) by mouth 4 (four) times daily for 10 days. 40 tablet 0   Facility-Administered Medications Prior to Visit  Medication Dose Route Frequency Provider Last  Rate Last Admin   ipratropium-albuterol (DUONEB) 0.5-2.5 (3) MG/3ML nebulizer solution 3 mL  3 mL Nebulization Q6H PRN Yevonne Pax, MD   3 mL at 06/17/21 1039    Review of Systems CNS: No confusion or sedation Cardiac: No angina or palpitations GI: No abdominal pain or constipation Constitutional: No nausea vomiting fevers or chills  Objective:  BP (!) 152/99   Pulse (!) 105   Temp (!) 97.3 F (36.3 C)   Resp 17   Ht 5\' 4"  (1.626 m)   Wt 177 lb (80.3 kg)   LMP 09/26/2014 Comment: Total  SpO2 99%   BMI 30.38 kg/m    BP Readings from Last 3 Encounters:  10/26/22 (!) 152/99  09/12/22 (!) 162/108  03/21/22 132/74     Wt Readings from Last 3 Encounters:  10/26/22 177 lb (80.3 kg)  03/21/22 227 lb 3.2 oz (103.1 kg)  02/03/22 240 lb (108.9 kg)     Physical Exam Pt is alert and oriented PERRL EOMI HEART IS RRR no murmur or rub LCTA no wheezing or rales MUSCULOSKELETAL reveals trigger point to right gluteal region mid body.  Some tenderness in the bilateral paraspinous muscles in the lumbar region.  She goes from seated to standing with no difficulty but obvious discomfort.  She has pelvic tilt with standing and has pain that is provoked with extension with both left and right lateral rotation.  Muscle tone and bulk to the lower extremities is appropriate sensation is intact strength is at 5/5 both proximal and distal in the ankles and knees.  Labs  Lab Results  Component Value Date   HGBA1C 4.9 11/16/2020   HGBA1C 5.3 06/04/2019   HGBA1C 5.3 10/04/2018   Lab Results  Component Value Date   MICROALBUR 0.2 06/04/2019   LDLCALC 106 (H) 11/16/2020   CREATININE 0.49 09/12/2022    -------------------------------------------------------------------------------------------------------------------- Lab Results  Component Value Date   WBC 5.1 09/12/2022   HGB 15.3 (H) 09/12/2022   HCT 45.9 09/12/2022   PLT 318 09/12/2022   GLUCOSE 81 09/12/2022   CHOL 175  11/16/2020   TRIG 188 (H) 11/16/2020   HDL 39 (L) 11/16/2020   LDLCALC 106 (H) 11/16/2020   ALT 17 09/12/2022   AST 26 09/12/2022   NA 140 09/12/2022   K 3.4 (L) 09/12/2022   CL 103 09/12/2022   CREATININE 0.49 09/12/2022   BUN 7 09/12/2022   CO2 24 09/12/2022   TSH 0.769 02/03/2016   INR 0.97 10/04/2016   HGBA1C 4.9 11/16/2020   MICROALBUR 0.2 06/04/2019    --------------------------------------------------------------------------------------------------------------------- DG Chest Portable 1 View  Result Date: 09/12/2022 CLINICAL DATA:  Shortness of breath. Rectal pain and bleeding. Reported hematemesis. EXAM: PORTABLE CHEST 1 VIEW COMPARISON:  07/21/2021 FINDINGS: Mild interstitial accentuation, as can commonly be encountered in smokers. Superimposed  right perihilar confluent interstitial accentuation which could be a manifestation of aspiration pneumonitis or early pneumonia. Cardiac and mediastinal margins appear normal. No blunting of the costophrenic angles. IMPRESSION: 1. Right perihilar interstitial accentuation which could be a manifestation of aspiration pneumonitis or early pneumonia. 2. Mild coarsening of lung markings, as can commonly be encountered in smokers. Electronically Signed   By: Gaylyn Rong M.D.   On: 09/12/2022 16:05   CT ABDOMEN PELVIS W CONTRAST  Result Date: 09/12/2022 CLINICAL DATA:  Rectal pain and bleeding. Hemoptysis. Epigastric pain. History of ulcers and hernia. Alcohol abuse. EXAM: CT ABDOMEN AND PELVIS WITH CONTRAST TECHNIQUE: Multidetector CT imaging of the abdomen and pelvis was performed using the standard protocol following bolus administration of intravenous contrast. RADIATION DOSE REDUCTION: This exam was performed according to the departmental dose-optimization program which includes automated exposure control, adjustment of the mA and/or kV according to patient size and/or use of iterative reconstruction technique. CONTRAST:  OMNIPAQUE  IOHEXOL 300 MG/ML  SOLN COMPARISON:  11/13/2019 FINDINGS: Lower chest: Anterolateral right lower lobe scarring. Normal heart size without pericardial or pleural effusion. Hepatobiliary: Mild hepatic steatosis. A too small to characterize hypoattenuating right liver lobe lesion was present on the prior and can be presumed benign. Nonspecific caudate lobe enlargement. Normal gallbladder, without biliary ductal dilatation. Pancreas: Normal, without mass or ductal dilatation. Spleen: Normal in size, without focal abnormality. Adrenals/Urinary Tract: Minimal right adrenal nodularity. Normal left adrenal gland. Normal kidneys, without hydronephrosis. Degraded evaluation of the pelvis, secondary to beam hardening artifact from bilateral hip arthroplasty. Bladder not well evaluated. Stomach/Bowel: Proximal gastric underdistention. Rectum and sigmoid also poorly evaluated. Normal terminal ileum and appendix. A loop of proximal jejunum is mildly dilated including at 3.1 cm on 34/2. No cause identified. Vascular/Lymphatic: Aortic atherosclerosis. No abdominopelvic adenopathy. Reproductive: Poorly evaluated Other: No abdominal ascites or free intraperitoneal air. Surgical changes about the left lateral abdominal wall secondary to prior spigelian hernia repair. Similar nodularity in this region including on 40/2. Musculoskeletal: Bilateral hip arthroplasty. IMPRESSION: 1. Degraded evaluation of the pelvis, secondary to bilateral hip arthroplasty. 2. Isolated loop of mildly dilated proximal jejunum, without cause identified. Possibly physiologic and related to peristalsis. Localized adynamic ileus could look similar. No specific findings to suggest partial small bowel obstruction. 3. Hepatic steatosis and nonspecific caudate lobe enlargement. Given clinical history, mild cirrhosis is suspected. 4. Similar appearance of postoperative presumed scarring about the lateral left abdominal wall, at the site of spigelian hernia repair  per prior report. 5. Proximal gastric wall appears thickened, favored to be due to underdistention. Gastritis could look similar. 6. Coronary artery atherosclerosis. Aortic Atherosclerosis (ICD10-I70.0). Electronically Signed   By: Jeronimo Greaves M.D.   On: 09/12/2022 15:03     Assessment & Plan:   Aderonke was seen today for back pain.  Diagnoses and all orders for this visit:  Chronic pain syndrome -     ToxASSURE Select 13 (MW), Urine  Other orders -     Oxycodone HCl 10 MG TABS; Take 1 tablet (10 mg total) by mouth every 6 (six) hours.        ----------------------------------------------------------------------------------------------------------------------  Problem List Items Addressed This Visit   None Visit Diagnoses     Chronic pain syndrome    -  Primary   Relevant Medications   Oxycodone HCl 10 MG TABS   Oxycodone HCl 10 MG TABS   Other Relevant Orders   ToxASSURE Select 13 (MW), Urine         ----------------------------------------------------------------------------------------------------------------------  1. Chronic pain syndrome 1. Chronic pain syndrome   2. DDD (degenerative disc disease), lumbar   3. Sciatica, right side   4. Pain of both hip joints   5. Chronic, continuous use of opioids   6. Low back pain at multiple sites   7. Cervicalgia   8. Pain due to total right knee replacement, sequela   9. Pain of right hip joint   10. Facet arthritis of lumbar region   11. Gluteal pain   Based on our discussion today that it is appropriate to reinstitute her opioid medications.  I do this for 4 times daily dosing on the oxycodone 10 mg dose.  She does take Klonopin at bedtime and knows to not combine these 2 medications.  We have been over this numerous times.  Furthermore I am going to schedule her for a trigger point injection here within the next week to 2 to see if this can help with some of the spasming she is experienced in the right gluteal  region.  She has lost several pounds which I am pleased should be beneficial for her hip and knee pain in addition to the back pain.  I want her to continue with core stretching exercises as reviewed.  Medications will be initiated today I have reviewed the [facility] practitioner database information and its appropriate.  Will schedule her for 1 month follow-up and we requested a urine screen for routine measuring.  Continue follow-up with her primary care physicians for baseline medical - ToxASSURE Select 13 (MW), Urine    ----------------------------------------------------------------------------------------------------------------------  I am having Wendy Mans. Comella "Wendy Johnson" start on Oxycodone HCl. I am also having her maintain her amphetamine-dextroamphetamine, busPIRone, sertraline, clonazePAM, traZODone, amphetamine-dextroamphetamine, Latuda, naloxone, Calcium Carb-Cholecalciferol (CALCIUM 600 + D PO), Aspirin-Caffeine (BC FAST PAIN RELIEF ARTHRITIS PO), Podiatric Products (GOLD BOND FOOT EX), hydrOXYzine, Trelegy Ellipta, betamethasone valerate, ibuprofen, albuterol, phenazopyridine, valsartan, dexlansoprazole, sucralfate, and Oxycodone HCl. We will continue to administer ipratropium-albuterol.   Meds ordered this encounter  Medications   Oxycodone HCl 10 MG TABS    Sig: Take 1 tablet (10 mg total) by mouth every 6 (six) hours.    Dispense:  120 tablet    Refill:  0   Patient's Medications  New Prescriptions   OXYCODONE HCL 10 MG TABS    Take 1 tablet (10 mg total) by mouth every 6 (six) hours.  Previous Medications   ALBUTEROL (VENTOLIN HFA) 108 (90 BASE) MCG/ACT INHALER    INHALE 2 PUFFS INTO THE LUNGS EVERY 6 HOURS AS NEEDED FOR WHEEZING OR SHORTNESS OF BREATH   AMPHETAMINE-DEXTROAMPHETAMINE (ADDERALL) 15 MG TABLET    Take 15 mg by mouth daily in the afternoon.   AMPHETAMINE-DEXTROAMPHETAMINE (ADDERALL) 30 MG TABLET    Take 30 mg by mouth 2 (two) times daily.   ASPIRIN-CAFFEINE  (BC FAST PAIN RELIEF ARTHRITIS PO)    Take 1 packet by mouth 2 (two) times daily as needed (pain).   BETAMETHASONE VALERATE (VALISONE) 0.1 % CREAM    Apply topically 2 (two) times daily.   BUSPIRONE (BUSPAR) 30 MG TABLET    Take 30 mg by mouth 2 (two) times daily.    CALCIUM CARB-CHOLECALCIFEROL (CALCIUM 600 + D PO)    Take 1 tablet by mouth 2 (two) times daily.   CLONAZEPAM (KLONOPIN) 1 MG TABLET    Take 1-2 mg by mouth 6 (six) times daily.   DEXLANSOPRAZOLE (DEXILANT) 60 MG CAPSULE    TAKE 1 CAPSULE BY MOUTH DAILY  FLUTICASONE-UMECLIDIN-VILANT (TRELEGY ELLIPTA) 100-62.5-25 MCG/ACT AEPB    Inhale 1 puff into the lungs daily.   HYDROXYZINE (ATARAX) 50 MG TABLET    Take by mouth.   IBUPROFEN (ADVIL) 800 MG TABLET    TAKE 1 TABLET BY MOUTH EVERY 8 HOURS AS NEEDED   LATUDA 80 MG TABS TABLET    Take 80 mg by mouth daily.   NALOXONE (NARCAN) NASAL SPRAY 4 MG/0.1 ML    As directed for opioid induced respiratory depression   OXYCODONE HCL 10 MG TABS    Take by mouth every 4 (four) hours as needed.   PHENAZOPYRIDINE (PYRIDIUM) 100 MG TABLET    Take 1 tablet (100 mg total) by mouth 3 (three) times daily as needed for pain.   PODIATRIC PRODUCTS (GOLD BOND FOOT EX)    Apply 1 application topically daily.   SERTRALINE (ZOLOFT) 100 MG TABLET    Take 200 mg by mouth daily.   SUCRALFATE (CARAFATE) 1 G TABLET    Take 1 tablet (1 g total) by mouth 4 (four) times daily for 10 days.   TRAZODONE (DESYREL) 100 MG TABLET    Take 400 mg by mouth at bedtime.    VALSARTAN (DIOVAN) 80 MG TABLET    TAKE 1 TABLET BY MOUTH DAILY  Modified Medications   No medications on file  Discontinued Medications   No medications on file   ----------------------------------------------------------------------------------------------------------------------  Follow-up: No follow-ups on file.    Yevette Edwards, MD

## 2022-10-26 NOTE — Patient Instructions (Signed)
Trigger Point Injections Patient Information  Description: Trigger points are areas of muscle sensitive to touch which cause pain with movement, sometimes felt some distance from the site of palpation.  Usually the muscle containing these trigger points if felt as a tight band or knot.   The area of maximum tenderness or trigger point is identified, and after antiseptic preparation of the skin, a small needle is placed into this site.  Reproduction of the pain often occurs and numbing medicine (local anesthetic) is injected into the site, sometimes along with steroid preparation.  The entire block usually lasts less than 5 minutes.  Conditions which may be treated by trigger points:  Muscular pain and spasm Nerve irritation  Preparation for the injection:  Do not eat any solid food or dairy products within 8 hours of your appointment. You may drink clear liquids up to 3 hours before appointment.  Clear liquids include water, black coffee, juice or soda.  No milk or cream please. You may take your regular medications, including pain medications, with a sip of water before your appointment.  Diabetics should hold regular insulin ( if take separately) and take 1/2 normal NPH dose the morning of the procedure.  Carry some sugar containing items with you to your appointment. A driver must accompany you and be prepared to drive you home after your procedure.  Bring all your current medications with you. An IV may be inserted and sedation may be given at the discretion of the physician.  A blood pressure cuff, EKG, and other monitors will often be applied during the procedure.  Some patients may need to have extra oxygen administered for a short period. You will be asked to provide medical information, including your allergies and medications, prior to the procedure.  We must know immediately if you are taking blood thinners (like Coumadin/Warfarin) or if you are allergic to IV iodine contrast (dye).   We must know if you could possibly be pregnant.  Possible side-effects:  Bleeding from needle site Infection (rare, may require surgery) Nerve injury (rare) Numbness & tingling (temporary) Punctured lung (if injection around chest) Light-headedness (temporary) Pain at injection site (several days) Decreased blood pressure (rare, temporary) Weakness in arm/leg (temporary)  Call if you experience:  Hive or difficulty breathing (go to the emergency room) Inflammation or drainage at the injection site(s)  Please note:  Although the local anesthetic injected can often make your painful muscle feel good for several hours after the injection, the pain may return.  It takes 3-7 days for steroids to work.  You may not notice any pain relief for at least one week.  If effective, we will often do a series of injections spaced 3-6 weeks apart to maximally decrease your pain.  If you have any questions please call [phone] [facility] 

## 2022-10-31 LAB — TOXASSURE SELECT 13 (MW), URINE

## 2022-11-29 ENCOUNTER — Encounter: Payer: Self-pay | Admitting: Anesthesiology

## 2022-11-29 ENCOUNTER — Other Ambulatory Visit: Payer: Self-pay | Admitting: Anesthesiology

## 2022-11-29 ENCOUNTER — Ambulatory Visit (HOSPITAL_BASED_OUTPATIENT_CLINIC_OR_DEPARTMENT_OTHER): Payer: 59 | Admitting: Anesthesiology

## 2022-11-29 ENCOUNTER — Ambulatory Visit
Admission: RE | Admit: 2022-11-29 | Discharge: 2022-11-29 | Disposition: A | Discharge status: Home / Self Care | Payer: 59 | Source: Ambulatory Visit | Attending: Anesthesiology | Admitting: Anesthesiology

## 2022-11-29 VITALS — BP 156/92 | HR 92 | Temp 97.2°F | Resp 18 | Ht 64.0 in | Wt 176.0 lb

## 2022-11-29 DIAGNOSIS — G894 Chronic pain syndrome: Secondary | ICD-10-CM | POA: Insufficient documentation

## 2022-11-29 DIAGNOSIS — R52 Pain, unspecified: Secondary | ICD-10-CM

## 2022-11-29 DIAGNOSIS — M25551 Pain in right hip: Secondary | ICD-10-CM | POA: Diagnosis not present

## 2022-11-29 DIAGNOSIS — M545 Low back pain, unspecified: Secondary | ICD-10-CM | POA: Diagnosis not present

## 2022-11-29 DIAGNOSIS — M5386 Other specified dorsopathies, lumbar region: Secondary | ICD-10-CM | POA: Insufficient documentation

## 2022-11-29 DIAGNOSIS — M542 Cervicalgia: Secondary | ICD-10-CM | POA: Diagnosis not present

## 2022-11-29 DIAGNOSIS — M5136 Other intervertebral disc degeneration, lumbar region: Secondary | ICD-10-CM | POA: Diagnosis not present

## 2022-11-29 DIAGNOSIS — M25552 Pain in left hip: Secondary | ICD-10-CM | POA: Diagnosis not present

## 2022-11-29 MED ORDER — DEXAMETHASONE SODIUM PHOSPHATE 10 MG/ML IJ SOLN
10.0000 mg | Freq: Once | INTRAMUSCULAR | Status: AC
  Administered 2022-11-29: 10 mg

## 2022-11-29 MED ORDER — DEXAMETHASONE SODIUM PHOSPHATE 10 MG/ML IJ SOLN
INTRAMUSCULAR | Status: AC
  Filled 2022-11-29: qty 1

## 2022-11-29 MED ORDER — ROPIVACAINE HCL 2 MG/ML IJ SOLN
INTRAMUSCULAR | Status: AC
  Filled 2022-11-29: qty 20

## 2022-11-29 MED ORDER — OXYCODONE HCL 10 MG PO TABS: 10.0000 mg | ORAL_TABLET | Freq: Four times a day (QID) | ORAL | 0 refills | Status: AC

## 2022-11-29 MED ORDER — ROPIVACAINE HCL 2 MG/ML IJ SOLN
10.0000 mL | Freq: Once | INTRAMUSCULAR | Status: AC
  Administered 2022-11-29: 10 mL via EPIDURAL

## 2022-11-29 NOTE — Progress Notes (Signed)
Nursing Pain Medication Assessment:  Safety precautions to be maintained throughout the outpatient stay will include: orient to surroundings, keep bed in low position, maintain call bell within reach at all times, provide assistance with transfer out of bed and ambulation.  Medication Inspection Compliance: Wendy Johnson did not comply with our request to bring her pills to be counted. She was reminded that bringing the medication bottles, even when empty, is a requirement.  Medication: None brought in. Pill/Patch Count: None available to be counted. Bottle Appearance: No container available. Did not bring bottle(s) to appointment. Filled Date: N/A Last Medication intake:  Today

## 2022-11-29 NOTE — Progress Notes (Signed)
Subjective:  Patient ID: Wendy Johnson, female    DOB: 11-20-1965  Age: 57 y.o. MRN: [id number]  CC: Back Pain   Procedure: Trigger point injection x 2 to the right lumbar level L5 posterior and 1 to the left at L5 additionally  HPI Wendy Johnson presents for reevaluation.  Wendy Johnson continues to have a lot of low back spasming but is not having any lower extremity sciatica symptoms.  She has a history of chronic diffuse low back hip and knee pain.  Unfortunately she has required chronic opioid therapy to keep her pain under good control.  Conservative medication management was unsuccessful and she maintains that physical therapy was ineffective.  Interventional injection therapy also was ineffective for her.  However, the pain she is reporting at this time is less sciatica or referred pain and mainly back spasming aching gnawing pain in the low back worse with any type of positional change or prolonged standing or motion.  At this point she is not doing any of her physical therapy modality secondary to this pain.  She takes her medications and they do help giving her about 50% relief and without them she maintains that she would be entirely dysfunctional.  They do enable her to sleep better at night.  No change in lower extremity strength function bowel or bladder function is noted at this time.  Outpatient Medications Prior to Visit  Medication Sig Dispense Refill   albuterol (VENTOLIN HFA) 108 (90 Base) MCG/ACT inhaler INHALE 2 PUFFS INTO THE LUNGS EVERY 6 HOURS AS NEEDED FOR WHEEZING OR SHORTNESS OF BREATH 8.5 g 0   amphetamine-dextroamphetamine (ADDERALL) 15 MG tablet Take 15 mg by mouth daily in the afternoon.     amphetamine-dextroamphetamine (ADDERALL) 30 MG tablet Take 30 mg by mouth 2 (two) times daily.     Aspirin-Caffeine (BC FAST PAIN RELIEF ARTHRITIS PO) Take 1 packet by mouth 2 (two) times daily as needed (pain).     betamethasone valerate (VALISONE) 0.1 % cream Apply topically 2 (two)  times daily. 30 g 0   busPIRone (BUSPAR) 30 MG tablet Take 30 mg by mouth 2 (two) times daily.      Calcium Carb-Cholecalciferol (CALCIUM 600 + D PO) Take 1 tablet by mouth 2 (two) times daily.     clonazePAM (KLONOPIN) 1 MG tablet Take 1-2 mg by mouth 6 (six) times daily.     dexlansoprazole (DEXILANT) 60 MG capsule TAKE 1 CAPSULE BY MOUTH DAILY 90 capsule 0   Fluticasone-Umeclidin-Vilant (TRELEGY ELLIPTA) 100-62.5-25 MCG/ACT AEPB Inhale 1 puff into the lungs daily. 1 each 11   hydrOXYzine (ATARAX) 50 MG tablet Take by mouth.     ibuprofen (ADVIL) 800 MG tablet TAKE 1 TABLET BY MOUTH EVERY 8 HOURS AS NEEDED 90 tablet 0   LATUDA 80 MG TABS tablet Take 80 mg by mouth daily.     naloxone (NARCAN) nasal spray 4 mg/0.1 mL As directed for opioid induced respiratory depression 1 each 1   Oxycodone HCl 10 MG TABS Take by mouth every 4 (four) hours as needed.     phenazopyridine (PYRIDIUM) 100 MG tablet Take 1 tablet (100 mg total) by mouth 3 (three) times daily as needed for pain. 10 tablet 0   Podiatric Products (GOLD BOND FOOT EX) Apply 1 application topically daily.     sertraline (ZOLOFT) 100 MG tablet Take 200 mg by mouth daily.     traZODone (DESYREL) 100 MG tablet Take 400 mg by mouth at bedtime.  valsartan (DIOVAN) 80 MG tablet TAKE 1 TABLET BY MOUTH DAILY 30 tablet 0   sucralfate (CARAFATE) 1 g tablet Take 1 tablet (1 g total) by mouth 4 (four) times daily for 10 days. 40 tablet 0   Facility-Administered Medications Prior to Visit  Medication Dose Route Frequency Provider Last Rate Last Admin   ipratropium-albuterol (DUONEB) 0.5-2.5 (3) MG/3ML nebulizer solution 3 mL  3 mL Nebulization Q6H PRN Yevonne Pax, MD   3 mL at 06/17/21 1039    Review of Systems CNS: No confusion or sedation Cardiac: No angina or palpitations GI: No abdominal pain or constipation Constitutional: No nausea vomiting fevers or chills  Objective:  BP (!) 156/92   Pulse 92   Temp (!) 97.2 F (36.2 C)  (Temporal)   Resp 18   Ht 5\' 4"  (1.626 m)   Wt 176 lb (79.8 kg)   LMP 09/26/2014 Comment: Total  SpO2 98%   BMI 30.21 kg/m    BP Readings from Last 3 Encounters:  11/29/22 (!) 156/92  10/26/22 (!) 152/99  09/12/22 (!) 162/108     Wt Readings from Last 3 Encounters:  11/29/22 176 lb (79.8 kg)  10/26/22 177 lb (80.3 kg)  03/21/22 227 lb 3.2 oz (103.1 kg)     Physical Exam Pt is alert and oriented PERRL EOMI HEART IS RRR no murmur or rub LCTA no wheezing or rales MUSCULOSKELETAL reveals 2 trigger points approximately L5 5 cm to the right of midline right side and one 3 to 4 cm to the left of midline left side.  These do represent her primary pain complaint.  Lower extremity strength and function appears to be intact with no change in lower extremity muscle tone or bulk.  She ambulates with an antalgic gait which is her baseline.  She does have pain going from the seated to standing position.  Labs  Lab Results  Component Value Date   HGBA1C 4.9 11/16/2020   HGBA1C 5.3 06/04/2019   HGBA1C 5.3 10/04/2018   Lab Results  Component Value Date   MICROALBUR 0.2 06/04/2019   LDLCALC 106 (H) 11/16/2020   CREATININE 0.49 09/12/2022    -------------------------------------------------------------------------------------------------------------------- Lab Results  Component Value Date   WBC 5.1 09/12/2022   HGB 15.3 (H) 09/12/2022   HCT 45.9 09/12/2022   PLT 318 09/12/2022   GLUCOSE 81 09/12/2022   CHOL 175 11/16/2020   TRIG 188 (H) 11/16/2020   HDL 39 (L) 11/16/2020   LDLCALC 106 (H) 11/16/2020   ALT 17 09/12/2022   AST 26 09/12/2022   NA 140 09/12/2022   K 3.4 (L) 09/12/2022   CL 103 09/12/2022   CREATININE 0.49 09/12/2022   BUN 7 09/12/2022   CO2 24 09/12/2022   TSH 0.769 02/03/2016   INR 0.97 10/04/2016   HGBA1C 4.9 11/16/2020   MICROALBUR 0.2 06/04/2019     --------------------------------------------------------------------------------------------------------------------- No results found.   Assessment & Plan:   Wendy "Wendy Johnson" was seen today for back pain.  Diagnoses and all orders for this visit:  Chronic pain syndrome -     ToxASSURE Select 13 (MW), Urine  DDD (degenerative disc disease), lumbar  Pain of both hip joints  Low back pain at multiple sites -     INJECT TRIGGER POINT, 1 OR 2 -     Ambulatory referral to Physical Therapy  Cervicalgia  Pain of right hip joint  Low back derangement syndrome -     INJECT TRIGGER POINT, 1 OR 2 -  Ambulatory referral to Physical Therapy  Other orders -     dexamethasone (DECADRON) injection 10 mg -     ropivacaine (PF) 2 mg/mL (0.2%) (NAROPIN) injection 10 mL -     Oxycodone HCl 10 MG TABS; Take 1 tablet (10 mg total) by mouth in the morning, at noon, in the evening, and at bedtime.        ----------------------------------------------------------------------------------------------------------------------  Problem List Items Addressed This Visit       Unprioritized   DDD (degenerative disc disease), lumbar   Relevant Medications   Oxycodone HCl 10 MG TABS   Low back derangement syndrome   Relevant Medications   Oxycodone HCl 10 MG TABS   Other Relevant Orders   INJECT TRIGGER POINT, 1 OR 2   Ambulatory referral to Physical Therapy   Other Visit Diagnoses     Chronic pain syndrome    -  Primary   Relevant Medications   dexamethasone (DECADRON) injection 10 mg (Completed)   ropivacaine (PF) 2 mg/mL (0.2%) (NAROPIN) injection 10 mL (Completed)   Oxycodone HCl 10 MG TABS   Other Relevant Orders   ToxASSURE Select 13 (MW), Urine   Pain of both hip joints       Low back pain at multiple sites       Relevant Medications   dexamethasone (DECADRON) injection 10 mg (Completed)   Oxycodone HCl 10 MG TABS   Other Relevant Orders   INJECT TRIGGER POINT, 1 OR 2    Ambulatory referral to Physical Therapy   Cervicalgia       Pain of right hip joint             ----------------------------------------------------------------------------------------------------------------------  1. Chronic pain syndrome Continue current medication management.  She has done well with this and required chronic opioid therapy but has responded favorably.  She has failed more conservative therapy.  I have reviewed the [facility] practitioner database information and it is appropriate for refill.  Will do this for the next month.  We will schedule her for return to clinic for repeat injection in 1 month additionally.  A urine request was generated today. - ToxASSURE Select 13 (MW), Urine  2. DDD (degenerative disc disease), lumbar Continue core stretching strengthening exercises  3. Pain of both hip joints   4. Low back pain at multiple sites We will proceed with trigger point injection today.  Have gone over the risks and benefits of the procedure with her in full detail all questions were answered.  I want her to continue with physical therapy stretching strengthening but I am going to refer her to formal physical therapy for education purposes.  I do not believe she is doing this activity appropriately or actively at present - INJECT TRIGGER POINT, 1 OR 2 - Ambulatory referral to Physical Therapy  5. Cervicalgia   6. Pain of right hip joint   7. Low back derangement syndrome As above - INJECT TRIGGER POINT, 1 OR 2 - Ambulatory referral to Physical Therapy    ----------------------------------------------------------------------------------------------------------------------  I am having Megan Mans. Kemple "Wendy Johnson" start on Oxycodone HCl. I am also having her maintain her amphetamine-dextroamphetamine, busPIRone, sertraline, clonazePAM, traZODone, amphetamine-dextroamphetamine, Latuda, naloxone, Calcium Carb-Cholecalciferol (CALCIUM 600 + D PO),  Aspirin-Caffeine (BC FAST PAIN RELIEF ARTHRITIS PO), Podiatric Products (GOLD BOND FOOT EX), hydrOXYzine, Trelegy Ellipta, betamethasone valerate, ibuprofen, albuterol, phenazopyridine, valsartan, dexlansoprazole, sucralfate, and Oxycodone HCl. We administered dexamethasone and ropivacaine (PF) 2 mg/mL (0.2%). We will continue to administer ipratropium-albuterol.   Meds  ordered this encounter  Medications   dexamethasone (DECADRON) injection 10 mg   ropivacaine (PF) 2 mg/mL (0.2%) (NAROPIN) injection 10 mL   Oxycodone HCl 10 MG TABS    Sig: Take 1 tablet (10 mg total) by mouth in the morning, at noon, in the evening, and at bedtime.    Dispense:  120 tablet    Refill:  0   Patient's Medications  New Prescriptions   OXYCODONE HCL 10 MG TABS    Take 1 tablet (10 mg total) by mouth in the morning, at noon, in the evening, and at bedtime.  Previous Medications   ALBUTEROL (VENTOLIN HFA) 108 (90 BASE) MCG/ACT INHALER    INHALE 2 PUFFS INTO THE LUNGS EVERY 6 HOURS AS NEEDED FOR WHEEZING OR SHORTNESS OF BREATH   AMPHETAMINE-DEXTROAMPHETAMINE (ADDERALL) 15 MG TABLET    Take 15 mg by mouth daily in the afternoon.   AMPHETAMINE-DEXTROAMPHETAMINE (ADDERALL) 30 MG TABLET    Take 30 mg by mouth 2 (two) times daily.   ASPIRIN-CAFFEINE (BC FAST PAIN RELIEF ARTHRITIS PO)    Take 1 packet by mouth 2 (two) times daily as needed (pain).   BETAMETHASONE VALERATE (VALISONE) 0.1 % CREAM    Apply topically 2 (two) times daily.   BUSPIRONE (BUSPAR) 30 MG TABLET    Take 30 mg by mouth 2 (two) times daily.    CALCIUM CARB-CHOLECALCIFEROL (CALCIUM 600 + D PO)    Take 1 tablet by mouth 2 (two) times daily.   CLONAZEPAM (KLONOPIN) 1 MG TABLET    Take 1-2 mg by mouth 6 (six) times daily.   DEXLANSOPRAZOLE (DEXILANT) 60 MG CAPSULE    TAKE 1 CAPSULE BY MOUTH DAILY   FLUTICASONE-UMECLIDIN-VILANT (TRELEGY ELLIPTA) 100-62.5-25 MCG/ACT AEPB    Inhale 1 puff into the lungs daily.   HYDROXYZINE (ATARAX) 50 MG TABLET    Take by  mouth.   IBUPROFEN (ADVIL) 800 MG TABLET    TAKE 1 TABLET BY MOUTH EVERY 8 HOURS AS NEEDED   LATUDA 80 MG TABS TABLET    Take 80 mg by mouth daily.   NALOXONE (NARCAN) NASAL SPRAY 4 MG/0.1 ML    As directed for opioid induced respiratory depression   OXYCODONE HCL 10 MG TABS    Take by mouth every 4 (four) hours as needed.   PHENAZOPYRIDINE (PYRIDIUM) 100 MG TABLET    Take 1 tablet (100 mg total) by mouth 3 (three) times daily as needed for pain.   PODIATRIC PRODUCTS (GOLD BOND FOOT EX)    Apply 1 application topically daily.   SERTRALINE (ZOLOFT) 100 MG TABLET    Take 200 mg by mouth daily.   SUCRALFATE (CARAFATE) 1 G TABLET    Take 1 tablet (1 g total) by mouth 4 (four) times daily for 10 days.   TRAZODONE (DESYREL) 100 MG TABLET    Take 400 mg by mouth at bedtime.    VALSARTAN (DIOVAN) 80 MG TABLET    TAKE 1 TABLET BY MOUTH DAILY  Modified Medications   No medications on file  Discontinued Medications   No medications on file   ----------------------------------------------------------------------------------------------------------------------  Follow-up: Return in about 1 month (around 12/30/2022) for evaluation, procedure, med refill.  Trigger point injection: The area overlying the aforementioned trigger points were prepped with alcohol. They were then injected with a 25-gauge needle with 4 cc of ropivacaine 0.2% and Decadron 4 mg at each site after negative aspiration for heme. This was performed after informed consent was obtained and risks and  benefits reviewed. She tolerated this procedure without difficulty and was convalesced and discharged to home in stable condition for follow-up as mentioned.  @  Pernell Dupre, MD@  Yevette Edwards, MD

## 2022-11-29 NOTE — Patient Instructions (Signed)

## 2022-11-30 ENCOUNTER — Telehealth: Payer: Self-pay

## 2022-11-30 NOTE — Telephone Encounter (Signed)
Post procedure phone call.  Call could not be completed. Unable to leave message.

## 2022-12-26 ENCOUNTER — Telehealth: Payer: Self-pay

## 2022-12-26 ENCOUNTER — Other Ambulatory Visit: Payer: Self-pay | Admitting: Anesthesiology

## 2022-12-26 ENCOUNTER — Ambulatory Visit (HOSPITAL_BASED_OUTPATIENT_CLINIC_OR_DEPARTMENT_OTHER): Payer: 59 | Admitting: Anesthesiology

## 2022-12-26 ENCOUNTER — Encounter: Payer: Self-pay | Admitting: Anesthesiology

## 2022-12-26 ENCOUNTER — Ambulatory Visit
Admission: RE | Admit: 2022-12-26 | Discharge: 2022-12-26 | Disposition: A | Discharge status: Home / Self Care | Payer: 59 | Source: Ambulatory Visit | Attending: Anesthesiology | Admitting: Anesthesiology

## 2022-12-26 VITALS — BP 158/102 | HR 93 | Temp 98.7°F | Resp 19 | Ht 64.0 in | Wt 163.0 lb

## 2022-12-26 DIAGNOSIS — R52 Pain, unspecified: Secondary | ICD-10-CM | POA: Diagnosis not present

## 2022-12-26 DIAGNOSIS — M5386 Other specified dorsopathies, lumbar region: Secondary | ICD-10-CM | POA: Insufficient documentation

## 2022-12-26 DIAGNOSIS — M545 Low back pain, unspecified: Secondary | ICD-10-CM | POA: Insufficient documentation

## 2022-12-26 DIAGNOSIS — M542 Cervicalgia: Secondary | ICD-10-CM

## 2022-12-26 DIAGNOSIS — M25552 Pain in left hip: Secondary | ICD-10-CM

## 2022-12-26 DIAGNOSIS — G894 Chronic pain syndrome: Secondary | ICD-10-CM

## 2022-12-26 DIAGNOSIS — F1190 Opioid use, unspecified, uncomplicated: Secondary | ICD-10-CM | POA: Diagnosis present

## 2022-12-26 DIAGNOSIS — M5136 Other intervertebral disc degeneration, lumbar region: Secondary | ICD-10-CM

## 2022-12-26 DIAGNOSIS — T8484XS Pain due to internal orthopedic prosthetic devices, implants and grafts, sequela: Secondary | ICD-10-CM | POA: Diagnosis not present

## 2022-12-26 DIAGNOSIS — M25511 Pain in right shoulder: Secondary | ICD-10-CM | POA: Insufficient documentation

## 2022-12-26 DIAGNOSIS — M25551 Pain in right hip: Secondary | ICD-10-CM | POA: Diagnosis not present

## 2022-12-26 DIAGNOSIS — M47816 Spondylosis without myelopathy or radiculopathy, lumbar region: Secondary | ICD-10-CM | POA: Insufficient documentation

## 2022-12-26 DIAGNOSIS — Z96651 Presence of right artificial knee joint: Secondary | ICD-10-CM | POA: Insufficient documentation

## 2022-12-26 DIAGNOSIS — M7918 Myalgia, other site: Secondary | ICD-10-CM | POA: Diagnosis not present

## 2022-12-26 MED ORDER — ROPIVACAINE HCL 2 MG/ML IJ SOLN
10.0000 mL | Freq: Once | INTRAMUSCULAR | Status: AC
  Administered 2022-12-26: 10 mL via EPIDURAL

## 2022-12-26 MED ORDER — ROPIVACAINE HCL 2 MG/ML IJ SOLN
INTRAMUSCULAR | Status: AC
  Filled 2022-12-26: qty 20

## 2022-12-26 MED ORDER — METHOCARBAMOL 1000 MG/10ML IJ SOLN
INTRAMUSCULAR | Status: AC
  Filled 2022-12-26: qty 10

## 2022-12-26 MED ORDER — DEXAMETHASONE SODIUM PHOSPHATE 10 MG/ML IJ SOLN
10.0000 mg | Freq: Once | INTRAMUSCULAR | Status: AC
  Administered 2022-12-26: 10 mg

## 2022-12-26 MED ORDER — DEXAMETHASONE SODIUM PHOSPHATE 10 MG/ML IJ SOLN
INTRAMUSCULAR | Status: AC
  Filled 2022-12-26: qty 1

## 2022-12-26 MED ORDER — ROPIVACAINE HCL 2 MG/ML IJ SOLN: 10.0000 mL | Freq: Once | INTRAMUSCULAR | Status: DC

## 2022-12-26 MED ORDER — DEXAMETHASONE SODIUM PHOSPHATE 10 MG/ML IJ SOLN: 10.0000 mg | Freq: Once | INTRAMUSCULAR | Status: DC

## 2022-12-26 MED ORDER — METHOCARBAMOL 1000 MG/10ML IJ SOLN
500.0000 mg | Freq: Once | INTRAMUSCULAR | Status: AC
  Administered 2022-12-26: 200 mg via INTRAMUSCULAR

## 2022-12-26 NOTE — Telephone Encounter (Signed)
LM for patient to call office

## 2022-12-26 NOTE — Progress Notes (Unsigned)
Safety precautions to be maintained throughout the outpatient stay will include: orient to surroundings, keep bed in low position, maintain call bell within reach at all times, provide assistance with transfer out of bed and ambulation.  

## 2022-12-26 NOTE — Patient Instructions (Signed)

## 2022-12-27 ENCOUNTER — Telehealth: Payer: Self-pay

## 2022-12-27 DIAGNOSIS — G894 Chronic pain syndrome: Secondary | ICD-10-CM | POA: Diagnosis not present

## 2022-12-27 NOTE — Telephone Encounter (Signed)
Post procedure follow up.  Lm

## 2022-12-28 ENCOUNTER — Encounter: Payer: Self-pay | Admitting: Anesthesiology

## 2022-12-28 NOTE — Progress Notes (Signed)
Subjective:  Patient ID: Wendy Johnson, female    DOB: March 08, 1966  Age: 57 y.o. MRN: [id number]  CC: Back Pain   Procedure: Left gluteal trigger point x 4  HPI Wendy Johnson presents for reevaluation.  Wendy Johnson continues to have complaints of severe low back pain and gluteal pain worse with any type of activity.  She has not been doing her stretching or physical therapy exercises.  She has been on chronic opioid therapy to help with chronic diffuse body pain including low back pain but has been out of these as of recent.  She denies change in the quality characteristic or distribution of this pain other than in the intensity in the left gluteal region.  She maintains that it is restricting her from doing her exercises.  In the past she was on opioids and these were beneficial but she is out of these at this time.  She denies diverting or illicit use with the medications.  Outpatient Medications Prior to Visit  Medication Sig Dispense Refill   albuterol (VENTOLIN HFA) 108 (90 Base) MCG/ACT inhaler INHALE 2 PUFFS INTO THE LUNGS EVERY 6 HOURS AS NEEDED FOR WHEEZING OR SHORTNESS OF BREATH 8.5 g 0   amphetamine-dextroamphetamine (ADDERALL) 15 MG tablet Take 15 mg by mouth daily in the afternoon.     amphetamine-dextroamphetamine (ADDERALL) 30 MG tablet Take 30 mg by mouth 2 (two) times daily.     Aspirin-Caffeine (BC FAST PAIN RELIEF ARTHRITIS PO) Take 1 packet by mouth 2 (two) times daily as needed (pain).     betamethasone valerate (VALISONE) 0.1 % cream Apply topically 2 (two) times daily. 30 g 0   busPIRone (BUSPAR) 30 MG tablet Take 30 mg by mouth 2 (two) times daily.      Calcium Carb-Cholecalciferol (CALCIUM 600 + D PO) Take 1 tablet by mouth 2 (two) times daily.     clonazePAM (KLONOPIN) 1 MG tablet Take 1-2 mg by mouth 6 (six) times daily.     dexlansoprazole (DEXILANT) 60 MG capsule TAKE 1 CAPSULE BY MOUTH DAILY 90 capsule 0   Fluticasone-Umeclidin-Vilant (TRELEGY ELLIPTA) 100-62.5-25  MCG/ACT AEPB Inhale 1 puff into the lungs daily. 1 each 11   hydrOXYzine (ATARAX) 50 MG tablet Take by mouth.     ibuprofen (ADVIL) 800 MG tablet TAKE 1 TABLET BY MOUTH EVERY 8 HOURS AS NEEDED 90 tablet 0   LATUDA 80 MG TABS tablet Take 80 mg by mouth daily.     naloxone (NARCAN) nasal spray 4 mg/0.1 mL As directed for opioid induced respiratory depression 1 each 1   Oxycodone HCl 10 MG TABS Take by mouth every 4 (four) hours as needed.     Oxycodone HCl 10 MG TABS Take 1 tablet (10 mg total) by mouth in the morning, at noon, in the evening, and at bedtime. 120 tablet 0   phenazopyridine (PYRIDIUM) 100 MG tablet Take 1 tablet (100 mg total) by mouth 3 (three) times daily as needed for pain. 10 tablet 0   Podiatric Products (GOLD BOND FOOT EX) Apply 1 application topically daily.     sertraline (ZOLOFT) 100 MG tablet Take 200 mg by mouth daily.     traZODone (DESYREL) 100 MG tablet Take 400 mg by mouth at bedtime.      valsartan (DIOVAN) 80 MG tablet TAKE 1 TABLET BY MOUTH DAILY 30 tablet 0   sucralfate (CARAFATE) 1 g tablet Take 1 tablet (1 g total) by mouth 4 (four) times daily for 10 days.  40 tablet 0   Facility-Administered Medications Prior to Visit  Medication Dose Route Frequency Provider Last Rate Last Admin   ipratropium-albuterol (DUONEB) 0.5-2.5 (3) MG/3ML nebulizer solution 3 mL  3 mL Nebulization Q6H PRN Yevonne Pax, MD   3 mL at 06/17/21 1039    Review of Systems CNS: No confusion or sedation Cardiac: No angina or palpitations GI: No abdominal pain or constipation Constitutional: No nausea vomiting fevers or chills  Objective:  BP (!) 158/102 Comment: has not taken BP medication today  Pulse 93   Temp 98.7 F (37.1 C)   Resp 19   Ht 5\' 4"  (1.626 m)   Wt 163 lb (73.9 kg)   LMP 09/26/2014 Comment: Total  SpO2 97%   BMI 27.98 kg/m    BP Readings from Last 3 Encounters:  12/26/22 (!) 158/102  11/29/22 (!) 156/92  10/26/22 (!) 152/99     Wt Readings from Last  3 Encounters:  12/26/22 163 lb (73.9 kg)  11/29/22 176 lb (79.8 kg)  10/26/22 177 lb (80.3 kg)     Physical Exam Pt is alert and oriented PERRL EOMI HEART IS RRR no murmur or rub LCTA no wheezing or rales MUSCULOSKELETAL reveals 4 trigger points in the left gluteal region with 1 over the left SI joint.  She walks with an antalgic gait and does have difficulty going from seated to standing secondary to pain.  Her muscle tone and bulk is at baseline.  Appears to be well-preserved.  Labs  Lab Results  Component Value Date   HGBA1C 4.9 11/16/2020   HGBA1C 5.3 06/04/2019   HGBA1C 5.3 10/04/2018   Lab Results  Component Value Date   MICROALBUR 0.2 06/04/2019   LDLCALC 106 (H) 11/16/2020   CREATININE 0.49 09/12/2022    -------------------------------------------------------------------------------------------------------------------- Lab Results  Component Value Date   WBC 5.1 09/12/2022   HGB 15.3 (H) 09/12/2022   HCT 45.9 09/12/2022   PLT 318 09/12/2022   GLUCOSE 81 09/12/2022   CHOL 175 11/16/2020   TRIG 188 (H) 11/16/2020   HDL 39 (L) 11/16/2020   LDLCALC 106 (H) 11/16/2020   ALT 17 09/12/2022   AST 26 09/12/2022   NA 140 09/12/2022   K 3.4 (L) 09/12/2022   CL 103 09/12/2022   CREATININE 0.49 09/12/2022   BUN 7 09/12/2022   CO2 24 09/12/2022   TSH 0.769 02/03/2016   INR 0.97 10/04/2016   HGBA1C 4.9 11/16/2020   MICROALBUR 0.2 06/04/2019    --------------------------------------------------------------------------------------------------------------------- No results found.   Assessment & Plan:   Ascencion "Wendy Johnson" was seen today for back pain.  Diagnoses and all orders for this visit:  Chronic pain syndrome -     ToxASSURE Select 13 (MW), Urine  DDD (degenerative disc disease), lumbar  Pain of both hip joints  Low back pain at multiple sites  Cervicalgia  Low back derangement syndrome  Chronic, continuous use of opioids  Pain due to total right  knee replacement, sequela  Facet arthritis of lumbar region  Gluteal pain  Pain in joint of right shoulder  Other orders -     dexamethasone (DECADRON) injection 10 mg -     ropivacaine (PF) 2 mg/mL (0.2%) (NAROPIN) injection 10 mL -     dexamethasone (DECADRON) injection 10 mg -     ropivacaine (PF) 2 mg/mL (0.2%) (NAROPIN) injection 10 mL -     methocarbamol (ROBAXIN) injection 500 mg        ----------------------------------------------------------------------------------------------------------------------  Problem List Items  Addressed This Visit       Unprioritized   DDD (degenerative disc disease), lumbar   Relevant Medications   dexamethasone (DECADRON) injection 10 mg   Facet arthritis of lumbar region   Relevant Medications   dexamethasone (DECADRON) injection 10 mg   Low back derangement syndrome   Relevant Medications   dexamethasone (DECADRON) injection 10 mg   Pain in joint, shoulder region   Painful total knee replacement, right (HCC)   Other Visit Diagnoses     Chronic pain syndrome    -  Primary   Relevant Medications   dexamethasone (DECADRON) injection 10 mg (Completed)   ropivacaine (PF) 2 mg/mL (0.2%) (NAROPIN) injection 10 mL (Completed)   dexamethasone (DECADRON) injection 10 mg   ropivacaine (PF) 2 mg/mL (0.2%) (NAROPIN) injection 10 mL   methocarbamol (ROBAXIN) injection 500 mg (Completed)   Other Relevant Orders   ToxASSURE Select 13 (MW), Urine   Pain of both hip joints       Low back pain at multiple sites       Relevant Medications   dexamethasone (DECADRON) injection 10 mg (Completed)   dexamethasone (DECADRON) injection 10 mg   methocarbamol (ROBAXIN) injection 500 mg (Completed)   Cervicalgia       Chronic, continuous use of opioids       Gluteal pain             ----------------------------------------------------------------------------------------------------------------------  1. Chronic pain syndrome She did have a  recent urinalysis showing negative for opioids though she recently ran out of these but positive for cocaine.  This was brought to her attention.  She maintains that she does not utilize illicit drugs. - ToxASSURE Select 13 (MW), Urine  2. DDD (degenerative disc disease), lumbar Continue core stretching exercises as reviewed today.  3. Pain of both hip joints Continue ambulation and we talked about physical therapy to assist with her low back pain and hip pain.  I am going to request evaluation and review.  4. Low back pain at multiple sites Continue TENS unit application and we will proceed with trigger point injections today.  We gone over the risks and benefits of these injections.  5. Cervicalgia   6. Low back derangement syndrome   7. Chronic, continuous use of opioids I have reviewed the [facility] practitioner database information.  We gone over the clinic policy regarding opioid use.  A repeat urinalysis will be requested today.  Contingent upon review we will determine whether to continue with opioid therapy  8. Pain due to total right knee replacement, sequela   9. Facet arthritis of lumbar region   10. Gluteal pain Trigger point injection today.  She will be scheduled for a 1 month return with possible trigger point at that time.  11. Pain in joint of right shoulder     ----------------------------------------------------------------------------------------------------------------------  I am having Megan Mans. Hammitt "Wendy Johnson" maintain her amphetamine-dextroamphetamine, busPIRone, sertraline, clonazePAM, traZODone, amphetamine-dextroamphetamine, Latuda, naloxone, Calcium Carb-Cholecalciferol (CALCIUM 600 + D PO), Aspirin-Caffeine (BC FAST PAIN RELIEF ARTHRITIS PO), Podiatric Products (GOLD BOND FOOT EX), hydrOXYzine, Trelegy Ellipta, betamethasone valerate, ibuprofen, albuterol, phenazopyridine, valsartan, dexlansoprazole, sucralfate, Oxycodone HCl, and Oxycodone HCl.  We administered dexamethasone, ropivacaine (PF) 2 mg/mL (0.2%), and methocarbamol. We will continue to administer ipratropium-albuterol.   Meds ordered this encounter  Medications   dexamethasone (DECADRON) injection 10 mg   ropivacaine (PF) 2 mg/mL (0.2%) (NAROPIN) injection 10 mL   dexamethasone (DECADRON) injection 10 mg   ropivacaine (PF) 2 mg/mL (0.2%) (NAROPIN)  injection 10 mL   methocarbamol (ROBAXIN) injection 500 mg   Patient's Medications  New Prescriptions   No medications on file  Previous Medications   ALBUTEROL (VENTOLIN HFA) 108 (90 BASE) MCG/ACT INHALER    INHALE 2 PUFFS INTO THE LUNGS EVERY 6 HOURS AS NEEDED FOR WHEEZING OR SHORTNESS OF BREATH   AMPHETAMINE-DEXTROAMPHETAMINE (ADDERALL) 15 MG TABLET    Take 15 mg by mouth daily in the afternoon.   AMPHETAMINE-DEXTROAMPHETAMINE (ADDERALL) 30 MG TABLET    Take 30 mg by mouth 2 (two) times daily.   ASPIRIN-CAFFEINE (BC FAST PAIN RELIEF ARTHRITIS PO)    Take 1 packet by mouth 2 (two) times daily as needed (pain).   BETAMETHASONE VALERATE (VALISONE) 0.1 % CREAM    Apply topically 2 (two) times daily.   BUSPIRONE (BUSPAR) 30 MG TABLET    Take 30 mg by mouth 2 (two) times daily.    CALCIUM CARB-CHOLECALCIFEROL (CALCIUM 600 + D PO)    Take 1 tablet by mouth 2 (two) times daily.   CLONAZEPAM (KLONOPIN) 1 MG TABLET    Take 1-2 mg by mouth 6 (six) times daily.   DEXLANSOPRAZOLE (DEXILANT) 60 MG CAPSULE    TAKE 1 CAPSULE BY MOUTH DAILY   FLUTICASONE-UMECLIDIN-VILANT (TRELEGY ELLIPTA) 100-62.5-25 MCG/ACT AEPB    Inhale 1 puff into the lungs daily.   HYDROXYZINE (ATARAX) 50 MG TABLET    Take by mouth.   IBUPROFEN (ADVIL) 800 MG TABLET    TAKE 1 TABLET BY MOUTH EVERY 8 HOURS AS NEEDED   LATUDA 80 MG TABS TABLET    Take 80 mg by mouth daily.   NALOXONE (NARCAN) NASAL SPRAY 4 MG/0.1 ML    As directed for opioid induced respiratory depression   OXYCODONE HCL 10 MG TABS    Take by mouth every 4 (four) hours as needed.   OXYCODONE HCL 10 MG  TABS    Take 1 tablet (10 mg total) by mouth in the morning, at noon, in the evening, and at bedtime.   PHENAZOPYRIDINE (PYRIDIUM) 100 MG TABLET    Take 1 tablet (100 mg total) by mouth 3 (three) times daily as needed for pain.   PODIATRIC PRODUCTS (GOLD BOND FOOT EX)    Apply 1 application topically daily.   SERTRALINE (ZOLOFT) 100 MG TABLET    Take 200 mg by mouth daily.   SUCRALFATE (CARAFATE) 1 G TABLET    Take 1 tablet (1 g total) by mouth 4 (four) times daily for 10 days.   TRAZODONE (DESYREL) 100 MG TABLET    Take 400 mg by mouth at bedtime.    VALSARTAN (DIOVAN) 80 MG TABLET    TAKE 1 TABLET BY MOUTH DAILY  Modified Medications   No medications on file  Discontinued Medications   No medications on file   ----------------------------------------------------------------------------------------------------------------------  Follow-up: No follow-ups on file.  Trigger point injection: The area overlying the aforementioned trigger points were prepped with alcohol. They were then injected with a 25-gauge needle with 2 cc of ropivacaine 0.2% and Decadron 2 mg at each site after negative aspiration for heme. This was performed after informed consent was obtained and risks and benefits reviewed. She tolerated this procedure without difficulty and was convalesced and discharged to home in stable condition for follow-up as mentioned.  @Marien Manship, MD@  Yevette Edwards, MD

## 2022-12-30 ENCOUNTER — Telehealth: Payer: Self-pay | Admitting: Internal Medicine

## 2022-12-30 LAB — TOXASSURE SELECT 13 (MW), URINE

## 2022-12-30 NOTE — Telephone Encounter (Signed)
Returned patient phone call and she is c/o increased back pain that is now going down to her knee.  States the injections that Dr Pernell Dupre gave on Monday did not help.  I told her that we do not have clinic on Fridays and she could seek medical attention at Urgent Care, ED etc.  Also, educated that if she is able to take tylenol safely she could use that for pain, ice/heat or any topical medications that she may have for pain relief.  Patient did elude to the fact that she had "flunked" her drug screen and that she does not understand because she does not use drugs.  I did tell her that I was aware of that and the drug screen performed on Monday has not resulted.  Patient asked If I would send a message to Dr Pernell Dupre to call her when he returns to clinic.

## 2022-12-30 NOTE — Telephone Encounter (Signed)
Patient states her back is hurting wants nurse or Dr Pernell Dupre to call her

## 2023-02-01 ENCOUNTER — Other Ambulatory Visit: Payer: Self-pay | Admitting: Anesthesiology

## 2023-02-01 ENCOUNTER — Ambulatory Visit (HOSPITAL_BASED_OUTPATIENT_CLINIC_OR_DEPARTMENT_OTHER): Payer: 59 | Admitting: Anesthesiology

## 2023-02-01 DIAGNOSIS — G894 Chronic pain syndrome: Secondary | ICD-10-CM

## 2023-02-01 DIAGNOSIS — R52 Pain, unspecified: Secondary | ICD-10-CM

## 2023-02-01 MED ORDER — DEXAMETHASONE SODIUM PHOSPHATE 10 MG/ML IJ SOLN: 10.0000 mg | Freq: Once | INTRAMUSCULAR | Status: DC

## 2023-02-01 MED ORDER — ROPIVACAINE HCL 2 MG/ML IJ SOLN: 10.0000 mL | Freq: Once | INTRAMUSCULAR | Status: DC

## 2023-02-21 ENCOUNTER — Ambulatory Visit: Payer: 59 | Admitting: Anesthesiology

## 2023-02-21 ENCOUNTER — Other Ambulatory Visit: Payer: Self-pay | Admitting: Anesthesiology

## 2023-02-21 ENCOUNTER — Encounter: Payer: Self-pay | Admitting: Anesthesiology

## 2023-02-21 ENCOUNTER — Ambulatory Visit
Admission: RE | Admit: 2023-02-21 | Discharge: 2023-02-21 | Disposition: A | Discharge status: Home / Self Care | Payer: 59 | Source: Ambulatory Visit | Attending: Anesthesiology | Admitting: Anesthesiology

## 2023-02-21 VITALS — BP 160/119 | HR 85 | Temp 98.2°F | Resp 17 | Ht 64.0 in | Wt 148.0 lb

## 2023-02-21 DIAGNOSIS — R52 Pain, unspecified: Secondary | ICD-10-CM

## 2023-02-21 DIAGNOSIS — M542 Cervicalgia: Secondary | ICD-10-CM | POA: Diagnosis not present

## 2023-02-21 DIAGNOSIS — M5441 Lumbago with sciatica, right side: Secondary | ICD-10-CM | POA: Diagnosis not present

## 2023-02-21 DIAGNOSIS — F1190 Opioid use, unspecified, uncomplicated: Secondary | ICD-10-CM | POA: Insufficient documentation

## 2023-02-21 DIAGNOSIS — M47816 Spondylosis without myelopathy or radiculopathy, lumbar region: Secondary | ICD-10-CM | POA: Insufficient documentation

## 2023-02-21 DIAGNOSIS — M25551 Pain in right hip: Secondary | ICD-10-CM | POA: Insufficient documentation

## 2023-02-21 DIAGNOSIS — G894 Chronic pain syndrome: Secondary | ICD-10-CM | POA: Insufficient documentation

## 2023-02-21 DIAGNOSIS — Z79891 Long term (current) use of opiate analgesic: Secondary | ICD-10-CM | POA: Insufficient documentation

## 2023-02-21 DIAGNOSIS — M545 Low back pain, unspecified: Secondary | ICD-10-CM

## 2023-02-21 DIAGNOSIS — M5431 Sciatica, right side: Secondary | ICD-10-CM

## 2023-02-21 DIAGNOSIS — M25552 Pain in left hip: Secondary | ICD-10-CM | POA: Insufficient documentation

## 2023-02-21 DIAGNOSIS — M5386 Other specified dorsopathies, lumbar region: Secondary | ICD-10-CM | POA: Diagnosis not present

## 2023-02-21 MED ORDER — SODIUM CHLORIDE (PF) 0.9 % IJ SOLN
INTRAMUSCULAR | Status: AC
  Filled 2023-02-21: qty 10

## 2023-02-21 MED ORDER — TRIAMCINOLONE ACETONIDE 40 MG/ML IJ SUSP
40.0000 mg | Freq: Once | INTRAMUSCULAR | Status: AC
  Administered 2023-02-21: 40 mg
  Filled 2023-02-21: qty 1

## 2023-02-21 MED ORDER — LIDOCAINE HCL (PF) 1 % IJ SOLN
5.0000 mL | Freq: Once | INTRAMUSCULAR | Status: AC
  Administered 2023-02-21: 5 mL via SUBCUTANEOUS
  Filled 2023-02-21: qty 5

## 2023-02-21 MED ORDER — CYCLOBENZAPRINE HCL 10 MG PO TABS: 10.0000 mg | ORAL_TABLET | Freq: Three times a day (TID) | ORAL | 0 refills | Status: DC | PRN

## 2023-02-21 MED ORDER — ROPIVACAINE HCL 2 MG/ML IJ SOLN
10.0000 mL | Freq: Once | INTRAMUSCULAR | Status: AC
  Administered 2023-02-21: 10 mL via EPIDURAL
  Filled 2023-02-21: qty 20

## 2023-02-21 MED ORDER — SODIUM CHLORIDE 0.9% FLUSH
10.0000 mL | Freq: Once | INTRAVENOUS | Status: AC
  Administered 2023-02-21: 10 mL

## 2023-02-21 MED ORDER — IOHEXOL 180 MG/ML  SOLN
10.0000 mL | Freq: Once | INTRAMUSCULAR | Status: AC | PRN
  Administered 2023-02-21: 10 mL via EPIDURAL
  Filled 2023-02-21: qty 20

## 2023-02-21 MED ORDER — OXYCODONE HCL 5 MG PO TABS: 5.0000 mg | ORAL_TABLET | Freq: Four times a day (QID) | ORAL | 0 refills | Status: AC | PRN

## 2023-02-21 NOTE — Progress Notes (Signed)
Subjective:  Patient ID: Wendy Johnson, female    DOB: 11/30/1965  Age: 57 y.o. MRN: [id number]  CC: Back Pain   Procedure: L5-S1 epidural steroid under fluoroscopic guidance with no sedation  HPI Wendy Johnson presents for reevaluation.  Wendy Johnson was unable to make her last appointment and has continued to have severe incapacitating low lumbar back pain with radiation into both hips and occasionally into the right posterior lateral leg greater than the left side.  Despite efforts at conservative care with anti-inflammatories and stretching the pain has intensified to the point where she is having difficulty with prolonged standing and increased pain going from seated to standing.  In the past she has had epidural injections and facet injections with good relief but secondary to financial limitations has been restricted from having these injections.  She has tried to do TENS unit application but because of living conditions cannot utilize this and physical therapy modality of a formal nature is not available to her.  She tries to do stretching exercises at home but these are too painful so she has been more restricted.  No change in bowel or bladder function is noted.  In the past she has been on chronic opioid therapy and reports that she gets good relief where she has failed more conservative therapy with anti-inflammatories muscle relaxants.  She denies any illicit drug use.  Her last urine drug screen was negative for illicit substances and she has been off of opioids.  Prior to that she did have 1 urine drug screen positive for cocaine metabolites.  She vehemently denies that she does or had done cocaine.  Outpatient Medications Prior to Visit  Medication Sig Dispense Refill   albuterol (VENTOLIN HFA) 108 (90 Base) MCG/ACT inhaler INHALE 2 PUFFS INTO THE LUNGS EVERY 6 HOURS AS NEEDED FOR WHEEZING OR SHORTNESS OF BREATH 8.5 g 0   amphetamine-dextroamphetamine (ADDERALL) 15 MG tablet Take 15 mg by  mouth daily in the afternoon.     amphetamine-dextroamphetamine (ADDERALL) 30 MG tablet Take 30 mg by mouth 2 (two) times daily.     Aspirin-Caffeine (BC FAST PAIN RELIEF ARTHRITIS PO) Take 1 packet by mouth 2 (two) times daily as needed (pain).     betamethasone valerate (VALISONE) 0.1 % cream Apply topically 2 (two) times daily. 30 g 0   busPIRone (BUSPAR) 30 MG tablet Take 30 mg by mouth 2 (two) times daily.      Calcium Carb-Cholecalciferol (CALCIUM 600 + D PO) Take 1 tablet by mouth 2 (two) times daily.     clonazePAM (KLONOPIN) 1 MG tablet Take 1-2 mg by mouth 6 (six) times daily.     dexlansoprazole (DEXILANT) 60 MG capsule TAKE 1 CAPSULE BY MOUTH DAILY 90 capsule 0   Fluticasone-Umeclidin-Vilant (TRELEGY ELLIPTA) 100-62.5-25 MCG/ACT AEPB Inhale 1 puff into the lungs daily. 1 each 11   hydrOXYzine (ATARAX) 50 MG tablet Take by mouth.     ibuprofen (ADVIL) 800 MG tablet TAKE 1 TABLET BY MOUTH EVERY 8 HOURS AS NEEDED 90 tablet 0   LATUDA 80 MG TABS tablet Take 80 mg by mouth daily.     naloxone (NARCAN) nasal spray 4 mg/0.1 mL As directed for opioid induced respiratory depression 1 each 1   Oxycodone HCl 10 MG TABS Take by mouth every 4 (four) hours as needed.     phenazopyridine (PYRIDIUM) 100 MG tablet Take 1 tablet (100 mg total) by mouth 3 (three) times daily as needed for pain. 10 tablet  0   Podiatric Products (GOLD BOND FOOT EX) Apply 1 application topically daily.     sertraline (ZOLOFT) 100 MG tablet Take 200 mg by mouth daily.     traZODone (DESYREL) 100 MG tablet Take 400 mg by mouth at bedtime.      valsartan (DIOVAN) 80 MG tablet TAKE 1 TABLET BY MOUTH DAILY 30 tablet 0   sucralfate (CARAFATE) 1 g tablet Take 1 tablet (1 g total) by mouth 4 (four) times daily for 10 days. 40 tablet 0   Facility-Administered Medications Prior to Visit  Medication Dose Route Frequency Provider Last Rate Last Admin   dexamethasone (DECADRON) injection 10 mg  10 mg Other Once Yevette Edwards, MD        ipratropium-albuterol (DUONEB) 0.5-2.5 (3) MG/3ML nebulizer solution 3 mL  3 mL Nebulization Q6H PRN Freda Munro A, MD   3 mL at 06/17/21 1039   ropivacaine (PF) 2 mg/mL (0.2%) (NAROPIN) injection 10 mL  10 mL Epidural Once Yevette Edwards, MD        Review of Systems CNS: No confusion or sedation Cardiac: No angina or palpitations GI: No abdominal pain or constipation Constitutional: No nausea vomiting fevers or chills  Objective:  BP (!) 160/119   Pulse 85   Temp 98.2 F (36.8 C)   Resp 17   Ht 5\' 4"  (1.626 m)   Wt 148 lb (67.1 kg)   LMP 09/26/2014 Comment: Total  SpO2 96%   BMI 25.40 kg/m    BP Readings from Last 3 Encounters:  02/21/23 (!) 160/119  12/26/22 (!) 158/102  11/29/22 (!) 156/92     Wt Readings from Last 3 Encounters:  02/21/23 148 lb (67.1 kg)  12/26/22 163 lb (73.9 kg)  11/29/22 176 lb (79.8 kg)     Physical Exam Pt is alert and oriented PERRL EOMI HEART IS RRR no murmur or rub LCTA no wheezing or rales MUSCULOSKELETAL reveals some severe paraspinous muscle tenderness in the bilateral lower lumbar region.  She has extensive pain and significant pain on extension of the low back and is almost unable to yield left or right lateral rotation.  He has pain going from seated to standing and takes extended time to fully stand erect.  Lower extremity strength appears to be well-preserved muscle tone and bulk is at baseline.  Labs  Lab Results  Component Value Date   HGBA1C 4.9 11/16/2020   HGBA1C 5.3 06/04/2019   HGBA1C 5.3 10/04/2018   Lab Results  Component Value Date   MICROALBUR 0.2 06/04/2019   LDLCALC 106 (H) 11/16/2020   CREATININE 0.49 09/12/2022    -------------------------------------------------------------------------------------------------------------------- Lab Results  Component Value Date   WBC 5.1 09/12/2022   HGB 15.3 (H) 09/12/2022   HCT 45.9 09/12/2022   PLT 318 09/12/2022   GLUCOSE 81 09/12/2022   CHOL 175  11/16/2020   TRIG 188 (H) 11/16/2020   HDL 39 (L) 11/16/2020   LDLCALC 106 (H) 11/16/2020   ALT 17 09/12/2022   AST 26 09/12/2022   NA 140 09/12/2022   K 3.4 (L) 09/12/2022   CL 103 09/12/2022   CREATININE 0.49 09/12/2022   BUN 7 09/12/2022   CO2 24 09/12/2022   TSH 0.769 02/03/2016   INR 0.97 10/04/2016   HGBA1C 4.9 11/16/2020   MICROALBUR 0.2 06/04/2019    --------------------------------------------------------------------------------------------------------------------- DG PAIN CLINIC C-ARM 1-60 MIN NO REPORT  Result Date: 02/21/2023 Fluoro was used, but no Radiologist interpretation will be provided. Please refer to "NOTES"  tab for provider progress note.    Assessment & Plan:   Evienne "Wendy Johnson" was seen today for back pain.  Diagnoses and all orders for this visit:  Chronic pain syndrome -     ToxASSURE Select 13 (MW), Urine -     ToxASSURE Select 13 (MW), Urine  Pain of both hip joints  Low back pain at multiple sites  Cervicalgia  Low back derangement syndrome -     triamcinolone acetonide (KENALOG-40) injection 40 mg -     sodium chloride flush (NS) 0.9 % injection 10 mL -     ropivacaine (PF) 2 mg/mL (0.2%) (NAROPIN) injection 10 mL -     lidocaine (PF) (XYLOCAINE) 1 % injection 5 mL -     iohexol (OMNIPAQUE) 180 MG/ML injection 10 mL  Chronic, continuous use of opioids -     ToxASSURE Select 13 (MW), Urine -     ToxASSURE Select 13 (MW), Urine  Facet arthritis of lumbar region  Sciatica, right side -     triamcinolone acetonide (KENALOG-40) injection 40 mg -     sodium chloride flush (NS) 0.9 % injection 10 mL -     ropivacaine (PF) 2 mg/mL (0.2%) (NAROPIN) injection 10 mL -     lidocaine (PF) (XYLOCAINE) 1 % injection 5 mL -     iohexol (OMNIPAQUE) 180 MG/ML injection 10 mL  Other orders -     oxyCODONE (ROXICODONE) 5 MG immediate release tablet; Take 1 tablet (5 mg total) by mouth every 6 (six) hours as needed for severe pain (pain score  7-10). -     cyclobenzaprine (FLEXERIL) 10 MG tablet; Take 1 tablet (10 mg total) by mouth 3 (three) times daily as needed for muscle spasms.        ----------------------------------------------------------------------------------------------------------------------  Problem List Items Addressed This Visit       Unprioritized   Facet arthritis of lumbar region   Relevant Medications   oxyCODONE (ROXICODONE) 5 MG immediate release tablet   cyclobenzaprine (FLEXERIL) 10 MG tablet   Low back derangement syndrome   Relevant Medications   oxyCODONE (ROXICODONE) 5 MG immediate release tablet   cyclobenzaprine (FLEXERIL) 10 MG tablet   Sciatica, right side   Relevant Medications   cyclobenzaprine (FLEXERIL) 10 MG tablet   Other Visit Diagnoses     Chronic pain syndrome    -  Primary   Relevant Medications   oxyCODONE (ROXICODONE) 5 MG immediate release tablet   cyclobenzaprine (FLEXERIL) 10 MG tablet   triamcinolone acetonide (KENALOG-40) injection 40 mg (Completed)   ropivacaine (PF) 2 mg/mL (0.2%) (NAROPIN) injection 10 mL (Completed)   lidocaine (PF) (XYLOCAINE) 1 % injection 5 mL (Completed)   Other Relevant Orders   ToxASSURE Select 13 (MW), Urine   ToxASSURE Select 13 (MW), Urine   Pain of both hip joints       Low back pain at multiple sites       Relevant Medications   oxyCODONE (ROXICODONE) 5 MG immediate release tablet   cyclobenzaprine (FLEXERIL) 10 MG tablet   triamcinolone acetonide (KENALOG-40) injection 40 mg (Completed)   Cervicalgia       Chronic, continuous use of opioids       Relevant Orders   ToxASSURE Select 13 (MW), Urine   ToxASSURE Select 13 (MW), Urine         ----------------------------------------------------------------------------------------------------------------------  1. Chronic pain syndrome Wendy Johnson has a very difficult situation.  She has questionable living circumstances and has been homeless for parts  of the past year.  She  maintains that her living circumstances have stabilized at present.  She denies diverting or illicit use of any drugs and is currently not taking opioids and has been quite miserable.  In the past she had been on 10 mg oxycodone approximately 4 to 6 hours getting good relief rated at 75% lasting 4 to 6 hours but has run out of these and with the recent urine screen positive for cocaine, this was discontinued.  Based on an extended conversation we have had today I am going to restart oxycodone 5 mg strength 4 times a day contingent on a repeat urine screen.  Additionally she is requesting an epidural steroid injection as these worked well for her in the past and she has been in exquisite pain and quite miserable.  I think this is reasonable.  We gone over the risks and benefits of epidural injections all of her questions have been answered.  I have encouraged her to continue with physical therapy and were going to refer her for evaluation additionally. - ToxASSURE Select 13 (MW), Urine - ToxASSURE Select 13 (MW), Urine  2. Pain of both hip joints Epidural injection today  3. Low back pain at multiple sites Continue with stretching and massage therapy as possible  4. Cervicalgia   5. Low back derangement syndrome As above - triamcinolone acetonide (KENALOG-40) injection 40 mg - sodium chloride flush (NS) 0.9 % injection 10 mL - ropivacaine (PF) 2 mg/mL (0.2%) (NAROPIN) injection 10 mL - lidocaine (PF) (XYLOCAINE) 1 % injection 5 mL - iohexol (OMNIPAQUE) 180 MG/ML injection 10 mL  6. Chronic, continuous use of opioids I have reviewed the [facility] practitioner database information.  We will start her on low-dose opioid and have her return to clinic for 1 month review - ToxASSURE Select 13 (MW), Urine - ToxASSURE Select 13 (MW), Urine  7. Facet arthritis of lumbar region As above  8. Sciatica, right side As above - triamcinolone acetonide (KENALOG-40) injection 40 mg - sodium  chloride flush (NS) 0.9 % injection 10 mL - ropivacaine (PF) 2 mg/mL (0.2%) (NAROPIN) injection 10 mL - lidocaine (PF) (XYLOCAINE) 1 % injection 5 mL - iohexol (OMNIPAQUE) 180 MG/ML injection 10 mL    ----------------------------------------------------------------------------------------------------------------------  I am having Megan Mans. [place] "Wendy Johnson" start on oxyCODONE and cyclobenzaprine. I am also having her maintain her amphetamine-dextroamphetamine, busPIRone, sertraline, clonazePAM, traZODone, amphetamine-dextroamphetamine, Latuda, naloxone, Calcium Carb-Cholecalciferol (CALCIUM 600 + D PO), Aspirin-Caffeine (BC FAST PAIN RELIEF ARTHRITIS PO), Podiatric Products (GOLD BOND FOOT EX), hydrOXYzine, Trelegy Ellipta, betamethasone valerate, ibuprofen, albuterol, phenazopyridine, valsartan, dexlansoprazole, sucralfate, and Oxycodone HCl. We administered triamcinolone acetonide, sodium chloride flush, ropivacaine (PF) 2 mg/mL (0.2%), lidocaine (PF), and iohexol. We will continue to administer ipratropium-albuterol.   Meds ordered this encounter  Medications   oxyCODONE (ROXICODONE) 5 MG immediate release tablet    Sig: Take 1 tablet (5 mg total) by mouth every 6 (six) hours as needed for severe pain (pain score 7-10).    Dispense:  120 tablet    Refill:  0   cyclobenzaprine (FLEXERIL) 10 MG tablet    Sig: Take 1 tablet (10 mg total) by mouth 3 (three) times daily as needed for muscle spasms.    Dispense:  30 tablet    Refill:  0   triamcinolone acetonide (KENALOG-40) injection 40 mg   sodium chloride flush (NS) 0.9 % injection 10 mL   ropivacaine (PF) 2 mg/mL (0.2%) (NAROPIN) injection 10 mL   lidocaine (PF) (  XYLOCAINE) 1 % injection 5 mL   iohexol (OMNIPAQUE) 180 MG/ML injection 10 mL   Patient's Medications  New Prescriptions   CYCLOBENZAPRINE (FLEXERIL) 10 MG TABLET    Take 1 tablet (10 mg total) by mouth 3 (three) times daily as needed for muscle spasms.   OXYCODONE (ROXICODONE) 5  MG IMMEDIATE RELEASE TABLET    Take 1 tablet (5 mg total) by mouth every 6 (six) hours as needed for severe pain (pain score 7-10).  Previous Medications   ALBUTEROL (VENTOLIN HFA) 108 (90 BASE) MCG/ACT INHALER    INHALE 2 PUFFS INTO THE LUNGS EVERY 6 HOURS AS NEEDED FOR WHEEZING OR SHORTNESS OF BREATH   AMPHETAMINE-DEXTROAMPHETAMINE (ADDERALL) 15 MG TABLET    Take 15 mg by mouth daily in the afternoon.   AMPHETAMINE-DEXTROAMPHETAMINE (ADDERALL) 30 MG TABLET    Take 30 mg by mouth 2 (two) times daily.   ASPIRIN-CAFFEINE (BC FAST PAIN RELIEF ARTHRITIS PO)    Take 1 packet by mouth 2 (two) times daily as needed (pain).   BETAMETHASONE VALERATE (VALISONE) 0.1 % CREAM    Apply topically 2 (two) times daily.   BUSPIRONE (BUSPAR) 30 MG TABLET    Take 30 mg by mouth 2 (two) times daily.    CALCIUM CARB-CHOLECALCIFEROL (CALCIUM 600 + D PO)    Take 1 tablet by mouth 2 (two) times daily.   CLONAZEPAM (KLONOPIN) 1 MG TABLET    Take 1-2 mg by mouth 6 (six) times daily.   DEXLANSOPRAZOLE (DEXILANT) 60 MG CAPSULE    TAKE 1 CAPSULE BY MOUTH DAILY   FLUTICASONE-UMECLIDIN-VILANT (TRELEGY ELLIPTA) 100-62.5-25 MCG/ACT AEPB    Inhale 1 puff into the lungs daily.   HYDROXYZINE (ATARAX) 50 MG TABLET    Take by mouth.   IBUPROFEN (ADVIL) 800 MG TABLET    TAKE 1 TABLET BY MOUTH EVERY 8 HOURS AS NEEDED   LATUDA 80 MG TABS TABLET    Take 80 mg by mouth daily.   NALOXONE (NARCAN) NASAL SPRAY 4 MG/0.1 ML    As directed for opioid induced respiratory depression   OXYCODONE HCL 10 MG TABS    Take by mouth every 4 (four) hours as needed.   PHENAZOPYRIDINE (PYRIDIUM) 100 MG TABLET    Take 1 tablet (100 mg total) by mouth 3 (three) times daily as needed for pain.   PODIATRIC PRODUCTS (GOLD BOND FOOT EX)    Apply 1 application topically daily.   SERTRALINE (ZOLOFT) 100 MG TABLET    Take 200 mg by mouth daily.   SUCRALFATE (CARAFATE) 1 G TABLET    Take 1 tablet (1 g total) by mouth 4 (four) times daily for 10 days.   TRAZODONE  (DESYREL) 100 MG TABLET    Take 400 mg by mouth at bedtime.    VALSARTAN (DIOVAN) 80 MG TABLET    TAKE 1 TABLET BY MOUTH DAILY  Modified Medications   No medications on file  Discontinued Medications   No medications on file   ----------------------------------------------------------------------------------------------------------------------  Follow-up: Return in about 1 month (around 03/24/2023) for evaluation, med refill.   Procedure: L5-S1 LESI with fluoroscopic guidance and without moderate sedation  NOTE: The risks, benefits, and expectations of the procedure have been discussed and explained to the patient who was understanding and in agreement with suggested treatment plan. No guarantees were made.  DESCRIPTION OF PROCEDURE: Lumbar epidural steroid injection with no IV Versed, EKG, blood pressure, pulse, and pulse oximetry monitoring. The procedure was performed with the patient in the  prone position under fluoroscopic guidance.  Sterile prep x3 was initiated and I then injected subcutaneous lidocaine to the overlying L5-S1 site after its fluoroscopic identifictation.  Using strict aseptic technique, I then advanced an 18-gauge Tuohy epidural needle in the midline using interlaminar approach via loss-of-resistance to saline technique. There was negative aspiration for heme or  CSF.  I then confirmed position with both AP and Lateral fluoroscan.  2 cc of contrast dye were injected and a  total of 5 mL of Preservative-Free normal saline mixed with 40 mg of Kenalog and 1cc Ropicaine 0.2 percent were injected incrementally via the  epidurally placed needle. The needle was removed. The patient tolerated the injection well and was convalesced and discharged to home in stable condition. Should the patient have any post procedure difficulty they have been instructed on how to contact us for assistance.   Yevette Edwards, MD

## 2023-02-21 NOTE — Progress Notes (Signed)
Safety precautions to be maintained throughout the outpatient stay will include: orient to surroundings, keep bed in low position, maintain call bell within reach at all times, provide assistance with transfer out of bed and ambulation.  

## 2023-03-23 ENCOUNTER — Ambulatory Visit: Payer: 59 | Attending: Anesthesiology | Admitting: Anesthesiology

## 2023-03-23 DIAGNOSIS — M25511 Pain in right shoulder: Secondary | ICD-10-CM | POA: Diagnosis not present

## 2023-03-23 DIAGNOSIS — G894 Chronic pain syndrome: Secondary | ICD-10-CM | POA: Diagnosis not present

## 2023-03-23 DIAGNOSIS — Z79891 Long term (current) use of opiate analgesic: Secondary | ICD-10-CM

## 2023-03-23 DIAGNOSIS — Z96651 Presence of right artificial knee joint: Secondary | ICD-10-CM

## 2023-03-23 DIAGNOSIS — M47816 Spondylosis without myelopathy or radiculopathy, lumbar region: Secondary | ICD-10-CM

## 2023-03-23 DIAGNOSIS — M7918 Myalgia, other site: Secondary | ICD-10-CM

## 2023-03-23 DIAGNOSIS — M25551 Pain in right hip: Secondary | ICD-10-CM | POA: Diagnosis not present

## 2023-03-23 DIAGNOSIS — M5431 Sciatica, right side: Secondary | ICD-10-CM | POA: Diagnosis not present

## 2023-03-23 DIAGNOSIS — F1190 Opioid use, unspecified, uncomplicated: Secondary | ICD-10-CM

## 2023-03-23 DIAGNOSIS — M545 Low back pain, unspecified: Secondary | ICD-10-CM | POA: Diagnosis not present

## 2023-03-23 DIAGNOSIS — M25552 Pain in left hip: Secondary | ICD-10-CM | POA: Diagnosis not present

## 2023-03-23 DIAGNOSIS — M5386 Other specified dorsopathies, lumbar region: Secondary | ICD-10-CM | POA: Diagnosis not present

## 2023-03-23 DIAGNOSIS — M542 Cervicalgia: Secondary | ICD-10-CM | POA: Diagnosis not present

## 2023-03-23 MED ORDER — OXYCODONE-ACETAMINOPHEN 7.5-325 MG PO TABS: 1.0000 | ORAL_TABLET | Freq: Four times a day (QID) | ORAL | 0 refills | Status: AC | PRN

## 2023-03-27 NOTE — Progress Notes (Signed)
Virtual Visit via Telephone Note  I connected with Darnella Zinman Quirk on 03/27/23 at  9:40 AM EST by telephone and verified that I am speaking with the correct person using two identifiers.  Location: Patient: Home Provider: Pain control center   I discussed the limitations, risks, security and privacy concerns of performing an evaluation and management service by telephone and the availability of in person appointments. I also discussed with the patient that there may be a patient responsible charge related to this service. The patient expressed understanding and agreed to proceed.   History of Present Illness: I spoke with Wendy Johnson via telephone as we were unable link for the video portion of the conference.  She reports that she still having a lot of low back pain and hip buttock and knee pain comparable to what she was describing at her last visit.  She is taking the hydrocodone 5 mg tablets 4 times a day and this has been relatively ineffective for her.  She gets about 25% relief with the medicine lasting about 4 to 6 hours.  In the past she was on a 10 mg tablet but secondary to some inconsistencies with dosing and her urinalysis for urine drug screen which she maintains was due to her recent homeless status, these were discontinued.  She is now living with consistent shelter and has better control of her surrounding environment.  She is taking her medications as scheduled and reports slight benefit but no significant relief.  Unfortunately she has failed more conservative therapy and appears to be quite miserable per her report.  The quality characteristic and distribution of the pain she experiences is stable in nature without recent change.  She mainly has chronic aching gnawing low back pain worse with any type of activity that limits her functionality.  It radiates into the left hip buttocks posterior lateral leg.  When she takes her opioids she gets significant improvement but on the lower  dose this is less so.  Otherwise her condition remains stable.  No change in lower extremity strength function bowel or bladder function is noted.  Review of systems: General: No fevers or chills Pulmonary: No shortness of breath or dyspnea Cardiac: No angina or palpitations or lightheadedness GI: No abdominal pain or constipation Psych: No depression    Observations/Objective:  Current Outpatient Medications:    oxyCODONE-acetaminophen (PERCOCET) 7.5-325 MG tablet, Take 1 tablet by mouth every 6 (six) hours as needed for moderate pain (pain score 4-6) or severe pain (pain score 7-10)., Disp: 120 tablet, Rfl: 0   albuterol (VENTOLIN HFA) 108 (90 Base) MCG/ACT inhaler, INHALE 2 PUFFS INTO THE LUNGS EVERY 6 HOURS AS NEEDED FOR WHEEZING OR SHORTNESS OF BREATH, Disp: 8.5 g, Rfl: 0   amphetamine-dextroamphetamine (ADDERALL) 15 MG tablet, Take 15 mg by mouth daily in the afternoon., Disp: , Rfl:    amphetamine-dextroamphetamine (ADDERALL) 30 MG tablet, Take 30 mg by mouth 2 (two) times daily., Disp: , Rfl:    Aspirin-Caffeine (BC FAST PAIN RELIEF ARTHRITIS PO), Take 1 packet by mouth 2 (two) times daily as needed (pain)., Disp: , Rfl:    betamethasone valerate (VALISONE) 0.1 % cream, Apply topically 2 (two) times daily., Disp: 30 g, Rfl: 0   busPIRone (BUSPAR) 30 MG tablet, Take 30 mg by mouth 2 (two) times daily. , Disp: , Rfl:    Calcium Carb-Cholecalciferol (CALCIUM 600 + D PO), Take 1 tablet by mouth 2 (two) times daily., Disp: , Rfl:    clonazePAM (KLONOPIN)  1 MG tablet, Take 1-2 mg by mouth 6 (six) times daily., Disp: , Rfl:    cyclobenzaprine (FLEXERIL) 10 MG tablet, Take 1 tablet (10 mg total) by mouth 3 (three) times daily as needed for muscle spasms., Disp: 30 tablet, Rfl: 0   dexlansoprazole (DEXILANT) 60 MG capsule, TAKE 1 CAPSULE BY MOUTH DAILY, Disp: 90 capsule, Rfl: 0   Fluticasone-Umeclidin-Vilant (TRELEGY ELLIPTA) 100-62.5-25 MCG/ACT AEPB, Inhale 1 puff into the lungs daily., Disp:  1 each, Rfl: 11   hydrOXYzine (ATARAX) 50 MG tablet, Take by mouth., Disp: , Rfl:    ibuprofen (ADVIL) 800 MG tablet, TAKE 1 TABLET BY MOUTH EVERY 8 HOURS AS NEEDED, Disp: 90 tablet, Rfl: 0   LATUDA 80 MG TABS tablet, Take 80 mg by mouth daily., Disp: , Rfl:    naloxone (NARCAN) nasal spray 4 mg/0.1 mL, As directed for opioid induced respiratory depression, Disp: 1 each, Rfl: 1   Oxycodone HCl 10 MG TABS, Take by mouth every 4 (four) hours as needed., Disp: , Rfl:    phenazopyridine (PYRIDIUM) 100 MG tablet, Take 1 tablet (100 mg total) by mouth 3 (three) times daily as needed for pain., Disp: 10 tablet, Rfl: 0   Podiatric Products (GOLD BOND FOOT EX), Apply 1 application topically daily., Disp: , Rfl:    sertraline (ZOLOFT) 100 MG tablet, Take 200 mg by mouth daily., Disp: , Rfl:    sucralfate (CARAFATE) 1 g tablet, Take 1 tablet (1 g total) by mouth 4 (four) times daily for 10 days., Disp: 40 tablet, Rfl: 0   traZODone (DESYREL) 100 MG tablet, Take 400 mg by mouth at bedtime. , Disp: , Rfl:    valsartan (DIOVAN) 80 MG tablet, TAKE 1 TABLET BY MOUTH DAILY, Disp: 30 tablet, Rfl: 0  Current Facility-Administered Medications:    ipratropium-albuterol (DUONEB) 0.5-2.5 (3) MG/3ML nebulizer solution 3 mL, 3 mL, Nebulization, Q6H PRN, Freda Munro A, MD, 3 mL at 06/17/21 1039  Facility-Administered Medications Ordered in Other Visits:    dexamethasone (DECADRON) injection 10 mg, 10 mg, Other, Once, Yevette Edwards, MD   ropivacaine (PF) 2 mg/mL (0.2%) (NAROPIN) injection 10 mL, 10 mL, Epidural, Once, Yevette Edwards, MD   Past Medical History:  Diagnosis Date   ADHD (attention deficit hyperactivity disorder)    Anemia 09/10/2021   Anxiety    AR (allergic rhinitis)    Arthritis    Benign hypertension    NO MEDS   Bipolar disorder (HCC)    Chronic back pain    Chronic constipation    Chronic insomnia    COPD (chronic obstructive pulmonary disease) (HCC)    Deaf    RIGHT EAR   Decreased  dorsalis pedis pulse    Depression    Dyslipidemia    Fatty liver    GERD (gastroesophageal reflux disease)    Hepatomegaly    High cholesterol    Hot flashes    Migraine with aura    OCD (obsessive compulsive disorder)    Pain in joint, shoulder region 12/19/2018   Plantar warts    Sciatica, right side 02/18/2019   Severe obesity (HCC)    Shortness of breath dyspnea    Sleep apnea    NO CPAP   Tobacco use    Trochanteric bursitis of right hip    Assessment and Plan: 1. Chronic pain syndrome   2. Pain of both hip joints   3. Low back pain at multiple sites   4. Cervicalgia   5.  Low back derangement syndrome   6. Chronic, continuous use of opioids   7. Facet arthritis of lumbar region   8. Sciatica, right side   9. Pain due to total right knee replacement, sequela   10. Gluteal pain   11. Pain in joint of right shoulder   12. Pain of right hip joint    [name] continues to have severe diffuse body pain requiring complex care.  The quality of the pain has been problematic and has required chronic opioid therapy in the past.  Unfortunately her living conditions have been a challenge as well.  We have been following frequent urine screens secondary to some inconsistencies in her urine with 1 reported positive cocaine back in July but other urine screens have been negative for this.  Unfortunately her pain syndrome is severe with intractable low back pain that has been unresponsive to conservative therapy despite efforts at physical therapy and injection therapy.  She has responded favorably in the past to chronic opioid therapy.  We will continue to monitor her with frequent urine screens and she is aware that any recurrent abnormality would be grounds for potential discontinuation of opioid therapy.  Secondary to the breakthrough nature at the 5 mg dose and going to increase her to 7.5 as a maximum dose to see if we can get her pain under better control as she does appear to be severely  lifestyle and physically restricted by her current pain condition.  Lastly, I am going to schedule her for an epidural in the near future to see if this can enable the medication she is taking to be more effective.  In the meantime, continue follow-up with her primary care physicians for baseline medical care.  She is scheduled for return to clinic in 2 months  Follow Up Instructions:    I discussed the assessment and treatment plan with the patient. The patient was provided an opportunity to ask questions and all were answered. The patient agreed with the plan and demonstrated an understanding of the instructions.   The patient was advised to call back or seek an in-person evaluation if the symptoms worsen or if the condition fails to improve as anticipated.  I provided 30 minutes of non-face-to-face time during this encounter.   Yevette Edwards, MD

## 2023-03-28 ENCOUNTER — Ambulatory Visit (INDEPENDENT_AMBULATORY_CARE_PROVIDER_SITE_OTHER): Payer: 59 | Admitting: Internal Medicine

## 2023-03-28 ENCOUNTER — Ambulatory Visit
Admission: RE | Admit: 2023-03-28 | Discharge: 2023-03-28 | Disposition: A | Discharge status: Home / Self Care | Payer: 59 | Source: Ambulatory Visit | Attending: Internal Medicine | Admitting: Internal Medicine

## 2023-03-28 ENCOUNTER — Ambulatory Visit: Payer: Self-pay | Admitting: *Deleted

## 2023-03-28 ENCOUNTER — Encounter: Payer: Self-pay | Admitting: Internal Medicine

## 2023-03-28 ENCOUNTER — Ambulatory Visit
Admission: RE | Admit: 2023-03-28 | Discharge: 2023-03-28 | Disposition: A | Discharge status: Home / Self Care | Payer: 59 | Attending: Internal Medicine | Admitting: Internal Medicine

## 2023-03-28 VITALS — BP 122/84 | HR 103 | Temp 98.1°F | Resp 18 | Ht 64.0 in | Wt 176.3 lb

## 2023-03-28 DIAGNOSIS — K219 Gastro-esophageal reflux disease without esophagitis: Secondary | ICD-10-CM

## 2023-03-28 DIAGNOSIS — R918 Other nonspecific abnormal finding of lung field: Secondary | ICD-10-CM | POA: Diagnosis not present

## 2023-03-28 DIAGNOSIS — J441 Chronic obstructive pulmonary disease with (acute) exacerbation: Secondary | ICD-10-CM

## 2023-03-28 DIAGNOSIS — R059 Cough, unspecified: Secondary | ICD-10-CM | POA: Diagnosis not present

## 2023-03-28 DIAGNOSIS — R062 Wheezing: Secondary | ICD-10-CM | POA: Diagnosis not present

## 2023-03-28 MED ORDER — DEXLANSOPRAZOLE 60 MG PO CPDR: 1.0000 | DELAYED_RELEASE_CAPSULE | Freq: Every day | ORAL | 0 refills | Status: DC

## 2023-03-28 MED ORDER — ALBUTEROL SULFATE HFA 108 (90 BASE) MCG/ACT IN AERS: 2.0000 | INHALATION_SPRAY | Freq: Four times a day (QID) | RESPIRATORY_TRACT | 0 refills | Status: DC | PRN

## 2023-03-28 MED ORDER — TRELEGY ELLIPTA 100-62.5-25 MCG/ACT IN AEPB: 1.0000 | INHALATION_SPRAY | Freq: Every day | RESPIRATORY_TRACT | 11 refills | Status: DC

## 2023-03-28 MED ORDER — IPRATROPIUM-ALBUTEROL 0.5-2.5 (3) MG/3ML IN SOLN: 3.0000 mL | RESPIRATORY_TRACT | 1 refills | Status: DC | PRN

## 2023-03-28 MED ORDER — DOXYCYCLINE HYCLATE 100 MG PO TABS
100.0000 mg | ORAL_TABLET | Freq: Two times a day (BID) | ORAL | 0 refills | Status: AC
Start: 2023-03-28 — End: 2023-04-04

## 2023-03-28 MED ORDER — PREDNISONE 20 MG PO TABS: 40.0000 mg | ORAL_TABLET | Freq: Every day | ORAL | 0 refills | Status: AC

## 2023-03-28 NOTE — Telephone Encounter (Signed)
Reason for Disposition  [1] Longstanding difficulty breathing (e.g., CHF, COPD, emphysema) AND [2] WORSE than normal  Answer Assessment - Initial Assessment Questions 1. RESPIRATORY STATUS: "Describe your breathing?" (e.g., wheezing, shortness of breath, unable to speak, severe coughing)      Pain with breathing- COPD, coughing, R hand - fourth and fifth digit numb 2. ONSET: "When did this breathing problem begin?"      Started 4 days ago 3. PATTERN "Does the difficult breathing come and go, or has it been constant since it started?"      constant 4. SEVERITY: "How bad is your breathing?" (e.g., mild, moderate, severe)    - MILD: No SOB at rest, mild SOB with walking, speaks normally in sentences, can lie down, no retractions, pulse < 100.    - MODERATE: SOB at rest, SOB with minimal exertion and prefers to sit, cannot lie down flat, speaks in phrases, mild retractions, audible wheezing, pulse 100-120.    - SEVERE: Very SOB at rest, speaks in single words, struggling to breathe, sitting hunched forward, retractions, pulse > 120      moderate 5. RECURRENT SYMPTOM: "Have you had difficulty breathing before?" If Yes, ask: "When was the last time?" and "What happened that time?"      no 6. CARDIAC HISTORY: "Do you have any history of heart disease?" (e.g., heart attack, angina, bypass surgery, angioplasty)      no 7. LUNG HISTORY: "Do you have any history of lung disease?"  (e.g., pulmonary embolus, asthma, emphysema)     COPD 8. CAUSE: "What do you think is causing the breathing problem?"      flare 9. OTHER SYMPTOMS: "Do you have any other symptoms? (e.g., dizziness, runny nose, cough, chest pain, fever)     Dizzy, chest pain yesterday 10. O2 SATURATION MONITOR:  "Do you use an oxygen saturation monitor (pulse oximeter) at home?" If Yes, ask: "What is your reading (oxygen level) today?" "What is your usual oxygen saturation reading?" (e.g., 95%)       no  Protocols used: Breathing  Difficulty-A-AH

## 2023-03-28 NOTE — Progress Notes (Signed)
Acute Office Visit  Subjective:     Patient ID: Wendy Johnson, female    DOB: [birth year], 57 y.o.   MRN: [id number]  Chief Complaint  Patient presents with   COPD    COPD She complains of cough, shortness of breath, sputum production and wheezing. Pertinent negatives include no chest pain, ear pain, fever or sore throat. Her past medical history is significant for COPD.   Patient is in today for cough, SOB. She is a patient of Dr. Carlynn Purl and is new to me. Had been on Trelegy daily, Albuterol PRN but has not been seen for a follow up in sometime and does not have a current Trelegy prescription. She's been coughing with dark colored thick mucus for 4 days. She endorses wheezing, SOB with exertion and painful inspiration. Symptoms worse at night. Using Albuterol inhaler every few hours currently. No fevers. No upper respiratory symptoms, no ear pain, congestion, sore throat, sinus pain.   Review of Systems  Constitutional:  Negative for chills and fever.  HENT:  Negative for congestion, ear pain, sinus pain and sore throat.   Respiratory:  Positive for cough, sputum production, shortness of breath and wheezing.   Cardiovascular:  Negative for chest pain.        Objective:    BP 122/84   Pulse (!) 103   Temp 98.1 F (36.7 C)   Resp 18   Ht 5\' 4"  (1.626 m)   Wt 176 lb 4.8 oz (80 kg)   LMP 09/26/2014 Comment: Total  SpO2 96%   BMI 30.26 kg/m  BP Readings from Last 3 Encounters:  03/28/23 122/84  02/21/23 (!) 160/119  12/26/22 (!) 158/102   Wt Readings from Last 3 Encounters:  03/28/23 176 lb 4.8 oz (80 kg)  02/21/23 148 lb (67.1 kg)  12/26/22 163 lb (73.9 kg)      Physical Exam Constitutional:      Appearance: Normal appearance.  HENT:     Head: Normocephalic and atraumatic.     Right Ear: Tympanic membrane, ear canal and external ear normal.     Left Ear: Tympanic membrane, ear canal and external ear normal.     Nose: Nose normal.     Mouth/Throat:     Mouth:  Mucous membranes are moist.     Pharynx: Oropharynx is clear.  Eyes:     Conjunctiva/sclera: Conjunctivae normal.  Cardiovascular:     Rate and Rhythm: Normal rate and regular rhythm.  Pulmonary:     Effort: Pulmonary effort is normal.     Breath sounds: Wheezing and rhonchi present.  Skin:    General: Skin is warm and dry.  Neurological:     General: No focal deficit present.     Mental Status: She is alert. Mental status is at baseline.  Psychiatric:        Mood and Affect: Mood normal.        Behavior: Behavior normal.     No results found for any visits on 03/28/23.      Assessment & Plan:   1. COPD exacerbation (HCC): Will refill Trelegy to continue daily, as well as Albuterol inhaler and Duonebs. Will prescribe Prednisone 40 mg x 5 days and Doxycycline 100 mg BID x 7 days. Will send for chest x-ray today. Follow up if symptoms worsen or fail to improve.   - Fluticasone-Umeclidin-Vilant (TRELEGY ELLIPTA) 100-62.5-25 MCG/ACT AEPB; Inhale 1 puff into the lungs daily.  Dispense: 1 each; Refill: 11 - albuterol (VENTOLIN  HFA) 108 (90 Base) MCG/ACT inhaler; Inhale 2 puffs into the lungs every 6 (six) hours as needed for wheezing or shortness of breath.  Dispense: 8.5 g; Refill: 0 - ipratropium-albuterol (DUONEB) 0.5-2.5 (3) MG/3ML SOLN; Take 3 mLs by nebulization every 4 (four) hours as needed.  Dispense: 360 mL; Refill: 1 - predniSONE (DELTASONE) 20 MG tablet; Take 2 tablets (40 mg total) by mouth daily with breakfast for 5 days.  Dispense: 10 tablet; Refill: 0 - DG Chest 2 View; Future - doxycycline (VIBRA-TABS) 100 MG tablet; Take 1 tablet (100 mg total) by mouth 2 (two) times daily for 7 days.  Dispense: 14 tablet; Refill: 0  2. Gastroesophageal reflux disease without esophagitis: Refill Dexilant but will need to be seen for regular follow up.   - dexlansoprazole (DEXILANT) 60 MG capsule; Take 1 capsule (60 mg total) by mouth daily.  Dispense: 30 capsule; Refill: 0   Return  for needs to schedule follow up with Dr. Tressie Ellis when appointment available .  Margarita Mail, DO

## 2023-03-28 NOTE — Telephone Encounter (Signed)
  Chief Complaint: COPD worse- patient states she does not have her O2- out for 1 year, pain with breathing Symptoms: cough, pain with breathing, chronic numbness- R digit- fourth/fifth Frequency: 4 days Pertinent Negatives: Patient denies fever, congestion Disposition: [] ED /[] Urgent Care (no appt availability in office) / [x] Appointment(In office/virtual)/ []  [place] [facility]/ [] Home Care/ [] Refused Recommended Disposition /[] [place] [facility]/ []  Follow-up with PCP Additional Notes: Patient states she was dismissed from her pulmonology provider and does not have O2 needed.  She does not have ability to monitor her O2 sat for chronic condition.

## 2023-04-03 ENCOUNTER — Ambulatory Visit: Payer: Self-pay

## 2023-04-03 NOTE — Telephone Encounter (Signed)
Pt is scheduled with Raynelle Fanning for 04/04/23

## 2023-04-03 NOTE — Telephone Encounter (Signed)
Patient called with complain of lung discomfort, patient hung up before connecting with the triage nurse. Attempted to callback, no answer, left voicemail to call the office to speak with NT.

## 2023-04-03 NOTE — Telephone Encounter (Signed)
  Chief Complaint: "Lungs hurt." Symptoms: Pt just states "Lungs hurt." 7/10 constant.  Frequency: Seen 03/28/23, prednisone and ATBs. States completed all, using nebs and inhalers. "No worse just no better." Pertinent Negatives: Patient denies fever Disposition: [] ED /[] Urgent Care (no appt availability in office) / [] Appointment(In office/virtual)/ []  [place] [facility]/ [] Home Care/ [] Refused Recommended Disposition /[x] [place] [facility]/ [x]  Follow-up with PCP Additional Notes:  Advised may need OV for eval, declines, "No ride." Assured pt NT would route to practice for PCPs review and final disposition. Care advise provided, pt verbalizes understanding.  Reason for Disposition  [1] Longstanding difficulty breathing AND [2] not responding to usual therapy  Answer Assessment - Initial Assessment Questions 1. RESPIRATORY STATUS: "Describe your breathing?" (e.g., wheezing, shortness of breath, unable to speak, severe coughing)      "Lungs hurt" 2. ONSET: "When did this breathing problem begin?"      03/28/23 OV 3. PATTERN "Does the difficult breathing come and go, or has it been constant since it started?"      Constant. 4. SEVERITY: "How bad is your breathing?" (e.g., mild, moderate, severe)    - MILD: No SOB at rest, mild SOB with walking, speaks normally in sentences, can lie down, no retractions, pulse < 100.    - MODERATE: SOB at rest, SOB with minimal exertion and prefers to sit, cannot lie down flat, speaks in phrases, mild retractions, audible wheezing, pulse 100-120.    - SEVERE: Very SOB at rest, speaks in single words, struggling to breathe, sitting hunched forward, retractions, pulse > 120      7/10 5. RECURRENT SYMPTOM: "Have you had difficulty breathing before?" If Yes, ask: "When was the last time?" and "What happened that time?"      Yes 6. CARDIAC HISTORY: "Do you have any history of heart disease?" (e.g., heart attack, angina, bypass surgery, angioplasty)       *No Answer* 7. LUNG HISTORY: "Do you have any history of lung disease?"  (e.g., pulmonary embolus, asthma, emphysema)     *No Answer* 8. CAUSE: "What do you think is causing the breathing problem?"      COPD 9. OTHER SYMPTOMS: "Do you have any other symptoms? (e.g., dizziness, runny nose, cough, chest pain, fever)     "Lungs hurt" 10. O2 SATURATION MONITOR:  "Do you use an oxygen saturation monitor (pulse oximeter) at home?" If Yes, ask: "What is your reading (oxygen level) today?" "What is your usual oxygen saturation reading?" (e.g., 95%)  Protocols used: Breathing Difficulty-A-AH

## 2023-04-04 ENCOUNTER — Ambulatory Visit: Payer: 59 | Admitting: Nurse Practitioner

## 2023-04-12 ENCOUNTER — Ambulatory Visit: Payer: 59 | Admitting: Surgery

## 2023-04-20 ENCOUNTER — Other Ambulatory Visit: Payer: Self-pay | Admitting: Internal Medicine

## 2023-04-20 DIAGNOSIS — J441 Chronic obstructive pulmonary disease with (acute) exacerbation: Secondary | ICD-10-CM

## 2023-04-20 DIAGNOSIS — K219 Gastro-esophageal reflux disease without esophagitis: Secondary | ICD-10-CM

## 2023-04-20 NOTE — Telephone Encounter (Signed)
Requested Prescriptions  Pending Prescriptions Disp Refills   dexlansoprazole (DEXILANT) 60 MG capsule [Pharmacy Med Name: DEXLANSOPRAZOLE 60 MG CAP] 90 capsule 0    Sig: TAKE 1 CAPSULE BY MOUTH DAILY     Gastroenterology: Proton Pump Inhibitors Passed - 04/20/2023  3:55 PM      Passed - Valid encounter within last 12 months    Recent Outpatient Visits           3 weeks ago COPD exacerbation [facility])   [place] [facility] Margarita Mail, DO   1 year ago Dysuria   [facility] Berniece Salines, FNP   1 year ago Left-sided chest wall pain   [facility] Alba Cory, MD   1 year ago Encounter for screening mammogram for malignant neoplasm of breast   [facility] Caro Laroche, DO   1 year ago COPD, moderate [facility])   [facility] Alba Cory, MD       Future Appointments             In 1 month Alba Cory, MD [facility], [facility]

## 2023-04-20 NOTE — Telephone Encounter (Signed)
Requested Prescriptions  Pending Prescriptions Disp Refills   albuterol (VENTOLIN HFA) 108 (90 Base) MCG/ACT inhaler [Pharmacy Med Name: ALBUTEROL SULFATE HFA 108 (90 BASE)] 8.5 g 2    Sig: INHALE 2 PUFFS INTO THE LUNGS EVERY 6 HOURS AS NEEDED FOR WHEEZING OR SHORTNESS OF BREATH     Pulmonology:  Beta Agonists 2 Passed - 04/20/2023  3:43 PM      Passed - Last BP in normal range    BP Readings from Last 1 Encounters:  03/28/23 122/84         Passed - Last Heart Rate in normal range    Pulse Readings from Last 1 Encounters:  03/28/23 (!) 103         Passed - Valid encounter within last 12 months    Recent Outpatient Visits           3 weeks ago COPD exacerbation [facility])   [facility] Margarita Mail, DO   1 year ago Dysuria   [facility] Berniece Salines, FNP   1 year ago Left-sided chest wall pain   [facility] Alba Cory, MD   1 year ago Encounter for screening mammogram for malignant neoplasm of breast   [facility] Caro Laroche, DO   1 year ago COPD, moderate [facility])   [facility] Alba Cory, MD       Future Appointments             In 1 month Alba Cory, MD [facility], [facility]

## 2023-05-22 ENCOUNTER — Ambulatory Visit: Payer: 59 | Admitting: Family Medicine

## 2023-05-24 ENCOUNTER — Encounter: Payer: Self-pay | Admitting: Anesthesiology

## 2023-08-02 ENCOUNTER — Other Ambulatory Visit: Payer: Self-pay | Admitting: Family Medicine

## 2023-08-02 DIAGNOSIS — J441 Chronic obstructive pulmonary disease with (acute) exacerbation: Secondary | ICD-10-CM

## 2023-08-07 ENCOUNTER — Ambulatory Visit: Attending: Anesthesiology | Admitting: Anesthesiology

## 2023-08-07 ENCOUNTER — Encounter: Payer: Self-pay | Admitting: Anesthesiology

## 2023-08-07 VITALS — BP 164/105 | HR 89 | Temp 97.9°F | Resp 18 | Ht 64.0 in | Wt 164.0 lb

## 2023-08-07 DIAGNOSIS — M25511 Pain in right shoulder: Secondary | ICD-10-CM | POA: Insufficient documentation

## 2023-08-07 DIAGNOSIS — M25551 Pain in right hip: Secondary | ICD-10-CM | POA: Insufficient documentation

## 2023-08-07 DIAGNOSIS — M5386 Other specified dorsopathies, lumbar region: Secondary | ICD-10-CM | POA: Insufficient documentation

## 2023-08-07 DIAGNOSIS — M5431 Sciatica, right side: Secondary | ICD-10-CM | POA: Insufficient documentation

## 2023-08-07 DIAGNOSIS — M7918 Myalgia, other site: Secondary | ICD-10-CM | POA: Diagnosis present

## 2023-08-07 DIAGNOSIS — Z96651 Presence of right artificial knee joint: Secondary | ICD-10-CM | POA: Insufficient documentation

## 2023-08-07 DIAGNOSIS — T8484XS Pain due to internal orthopedic prosthetic devices, implants and grafts, sequela: Secondary | ICD-10-CM | POA: Insufficient documentation

## 2023-08-07 DIAGNOSIS — M542 Cervicalgia: Secondary | ICD-10-CM | POA: Insufficient documentation

## 2023-08-07 DIAGNOSIS — F1190 Opioid use, unspecified, uncomplicated: Secondary | ICD-10-CM | POA: Diagnosis present

## 2023-08-07 DIAGNOSIS — G894 Chronic pain syndrome: Secondary | ICD-10-CM | POA: Insufficient documentation

## 2023-08-07 DIAGNOSIS — Z79891 Long term (current) use of opiate analgesic: Secondary | ICD-10-CM

## 2023-08-07 DIAGNOSIS — M47816 Spondylosis without myelopathy or radiculopathy, lumbar region: Secondary | ICD-10-CM | POA: Insufficient documentation

## 2023-08-07 DIAGNOSIS — M25552 Pain in left hip: Secondary | ICD-10-CM | POA: Diagnosis not present

## 2023-08-07 DIAGNOSIS — M545 Low back pain, unspecified: Secondary | ICD-10-CM | POA: Insufficient documentation

## 2023-08-07 NOTE — Patient Instructions (Signed)

## 2023-08-07 NOTE — Progress Notes (Signed)
 Nursing Pain Medication Assessment:  Safety precautions to be maintained throughout the outpatient stay will include: orient to surroundings, keep bed in low position, maintain call bell within reach at all times, provide assistance with transfer out of bed and ambulation.  Medication Inspection Compliance: Wendy Johnson did not comply with our request to bring her pills to be counted. She was reminded that bringing the medication bottles, even when empty, is a requirement.  Medication: None brought in. Pill/Patch Count: None available to be counted. Bottle Appearance: No container available. Did not bring bottle(s) to appointment. Filled Date: N/A Last Medication intake:  TodaySafety precautions to be maintained throughout the outpatient stay will include: orient to surroundings, keep bed in low position, maintain call bell within reach at all times, provide assistance with transfer out of bed and ambulation.

## 2023-08-09 ENCOUNTER — Encounter: Payer: Self-pay | Admitting: Anesthesiology

## 2023-08-09 NOTE — Progress Notes (Signed)
 Subjective:  Patient ID: Wendy Johnson, female    DOB: 28-Jun-1965  Age: 58 y.o. MRN: [id number]  CC: Back Pain (lower)   Procedure: None  HPI Wendy Johnson presents for reevaluation.  She presents with consistent complaints of bilateral knee pain hip pain low back pain and intermittent cervical pain with some right fourth and fifth digit numbness and tingling.  No complaints of any weakness to the upper extremities or change in lower extremity strength or function is noted.  Unfortunately she has had chronic problems with recurrent and insidious joint back and neck pain.  She has been on chronic opioid therapy for an extended period of time secondary to failed conservative therapy.  In the past she has been on Percocet 02/09/2024 taking tablets every 4-6 hours and generally she is getting good relief from this.  She has had issues with housing and maintains that she has had to live on the streets which has made it difficult for her to keep control of her medications.  She denies any illicit or diverting activity with the medicines.  She maintains that she has gotten good relief from these in the past.  Otherwise she is in her usual state of health.  She is trying to do physical therapy exercises and has lost a considerable amount of weight which has helped alleviate some of her lower back pain and is giving her some benefit of the joint pain 2.   Outpatient Medications Prior to Visit  Medication Sig Dispense Refill   albuterol (VENTOLIN HFA) 108 (90 Base) MCG/ACT inhaler INHALE 2 PUFFS INTO THE LUNGS EVERY 6 HOURS AS NEEDED FOR WHEEZING OR SHORTNESS OF BREATH 8.5 g 2   amphetamine-dextroamphetamine (ADDERALL) 15 MG tablet Take 15 mg by mouth daily in the afternoon.     amphetamine-dextroamphetamine (ADDERALL) 30 MG tablet Take 30 mg by mouth 2 (two) times daily.     busPIRone (BUSPAR) 30 MG tablet Take 30 mg by mouth 2 (two) times daily.      clonazePAM (KLONOPIN) 1 MG tablet Take 1-2 mg by  mouth 6 (six) times daily.     dexlansoprazole (DEXILANT) 60 MG capsule TAKE 1 CAPSULE BY MOUTH DAILY 90 capsule 0   Fluticasone-Umeclidin-Vilant (TRELEGY ELLIPTA) 100-62.5-25 MCG/ACT AEPB Inhale 1 puff into the lungs daily. 1 each 11   LATUDA 80 MG TABS tablet Take 80 mg by mouth daily.     naloxone (NARCAN) nasal spray 4 mg/0.1 mL As directed for opioid induced respiratory depression 1 each 1   Oxycodone HCl 10 MG TABS Take by mouth every 4 (four) hours as needed.     sertraline (ZOLOFT) 100 MG tablet Take 200 mg by mouth daily.     traZODone (DESYREL) 100 MG tablet Take 400 mg by mouth at bedtime.      Aspirin-Caffeine (BC FAST PAIN RELIEF ARTHRITIS PO) Take 1 packet by mouth 2 (two) times daily as needed (pain). (Patient not taking: Reported on 08/07/2023)     betamethasone valerate (VALISONE) 0.1 % cream Apply topically 2 (two) times daily. (Patient not taking: Reported on 08/07/2023) 30 g 0   Calcium Carb-Cholecalciferol (CALCIUM 600 + D PO) Take 1 tablet by mouth 2 (two) times daily. (Patient not taking: Reported on 08/07/2023)     cyclobenzaprine (FLEXERIL) 10 MG tablet Take 1 tablet (10 mg total) by mouth 3 (three) times daily as needed for muscle spasms. (Patient not taking: Reported on 08/07/2023) 30 tablet 0   hydrOXYzine (ATARAX) 50 MG  tablet Take by mouth. (Patient not taking: Reported on 08/07/2023)     ibuprofen (ADVIL) 800 MG tablet TAKE 1 TABLET BY MOUTH EVERY 8 HOURS AS NEEDED (Patient not taking: Reported on 08/07/2023) 90 tablet 0   ipratropium-albuterol (DUONEB) 0.5-2.5 (3) MG/3ML SOLN Take 3 mLs by nebulization every 4 (four) hours as needed. (Patient not taking: Reported on 08/07/2023) 360 mL 1   phenazopyridine (PYRIDIUM) 100 MG tablet Take 1 tablet (100 mg total) by mouth 3 (three) times daily as needed for pain. (Patient not taking: Reported on 08/07/2023) 10 tablet 0   Podiatric Products (GOLD BOND FOOT EX) Apply 1 application topically daily. (Patient not taking: Reported on  08/07/2023)     sucralfate (CARAFATE) 1 g tablet Take 1 tablet (1 g total) by mouth 4 (four) times daily for 10 days. 40 tablet 0   valsartan (DIOVAN) 80 MG tablet TAKE 1 TABLET BY MOUTH DAILY (Patient not taking: Reported on 08/07/2023) 30 tablet 0   No facility-administered medications prior to visit.    Review of Systems CNS: No confusion or sedation Cardiac: No angina or palpitations GI: No abdominal pain or constipation Constitutional: No nausea vomiting fevers or chills  Objective:  BP (!) 164/105 (Cuff Size: Normal)   Pulse 89   Temp 97.9 F (36.6 C) (Temporal)   Resp 18   Ht 5\' 4"  (1.626 m)   Wt 164 lb (74.4 kg)   LMP 09/26/2014 Comment: Total  SpO2 100%   BMI 28.15 kg/m    BP Readings from Last 3 Encounters:  08/07/23 (!) 164/105  03/28/23 122/84  02/21/23 (!) 160/119     Wt Readings from Last 3 Encounters:  08/07/23 164 lb (74.4 kg)  03/28/23 176 lb 4.8 oz (80 kg)  02/21/23 148 lb (67.1 kg)     Physical Exam Pt is alert and oriented PERRL EOMI HEART IS RRR no murmur or rub LCTA no wheezing or rales MUSCULOSKELETAL reveals some paraspinous muscle tenderness in the cervical spine and bilateral trapezius musculature.  This is also present in the lumbar region with probable trigger points at L5 bilateral.  Lower extremity muscle tone and bulk is at baseline she walks with an antalgic gait.  She is able to go from seated to standing with minimal difficulty.  Labs  Lab Results  Component Value Date   HGBA1C 4.9 11/16/2020   HGBA1C 5.3 06/04/2019   HGBA1C 5.3 10/04/2018   Lab Results  Component Value Date   MICROALBUR 0.2 06/04/2019   LDLCALC 106 (H) 11/16/2020   CREATININE 0.49 09/12/2022    -------------------------------------------------------------------------------------------------------------------- Lab Results  Component Value Date   WBC 5.1 09/12/2022   HGB 15.3 (H) 09/12/2022   HCT 45.9 09/12/2022   PLT 318 09/12/2022   GLUCOSE 81  09/12/2022   CHOL 175 11/16/2020   TRIG 188 (H) 11/16/2020   HDL 39 (L) 11/16/2020   LDLCALC 106 (H) 11/16/2020   ALT 17 09/12/2022   AST 26 09/12/2022   NA 140 09/12/2022   K 3.4 (L) 09/12/2022   CL 103 09/12/2022   CREATININE 0.49 09/12/2022   BUN 7 09/12/2022   CO2 24 09/12/2022   TSH 0.769 02/03/2016   INR 0.97 10/04/2016   HGBA1C 4.9 11/16/2020   MICROALBUR 0.2 06/04/2019    --------------------------------------------------------------------------------------------------------------------- DG Chest 2 View Result Date: 04/14/2023 CLINICAL DATA:  copd exacerbation, cough, wheeze EXAM: CHEST - 2 VIEW COMPARISON:  09/12/2022 FINDINGS: The heart size and mediastinal contours are within normal limits. Minor scattered basilar  atelectasis versus scarring. Both lungs are clear. The visualized skeletal structures are unremarkable. IMPRESSION: Minor basilar atelectasis versus scarring.  No acute chest process. Electronically Signed   By: Judie Petit.  Shick M.D.   On: 04/14/2023 08:50     Assessment & Plan:   Wendy "[name]" was seen today for back pain.  Diagnoses and all orders for this visit:  Low back pain at multiple sites -     ToxASSURE Select 13 (MW), Urine  Pain syndrome, chronic -     ToxASSURE Select 13 (MW), Urine        ----------------------------------------------------------------------------------------------------------------------  Problem List Items Addressed This Visit       Unprioritized   Pain syndrome, chronic   Relevant Orders   ToxASSURE Select 13 (MW), Urine   Other Visit Diagnoses       Low back pain at multiple sites    -  Primary   Relevant Orders   ToxASSURE Select 13 (MW), Urine         ----------------------------------------------------------------------------------------------------------------------  1. Low back pain at multiple sites (Primary) Continue core stretching strengthening exercises.  I am going to set her up with a TENS  application to see if this can help remedy some of the myofascial type pain she is experiencing.  I gone over her lumbar MRI which is relatively benign.  I have asked her to continue with core stretching strengthening exercises and efforts at weight loss. - ToxASSURE Select 13 (MW), Urine  2. Pain syndrome, chronic 1. Low back pain at multiple sites   2. Pain syndrome, chronic   3. Chronic pain syndrome   4. Pain of both hip joints   5. Cervicalgia   6. Low back derangement syndrome   7. Chronic, continuous use of opioids   8. Facet arthritis of lumbar region   9. Sciatica, right side   10. Pain due to total right knee replacement, sequela   11. Gluteal pain   12. Pain in joint of right shoulder   13. Pain of right hip joint    Additionally she has had problems with consistent findings on her urine drug screens.  She maintains that she has been compliant with her regimen but she did have a positive urine screen several months ago for cocaine.  She denies any recurrent activity of that nature and maintains that she has failed more conservative therapy and only the opioids are beneficial.  A urine drug screen has been requested today and contingent on that and our discussion we may institute low-dose conservative opioid therapy secondary to the diffuse nature of her persistent pain.  She has failed conservative therapy and has required additional support secondary to this.  She maintains that her social circumstances have improved but has discussed any effort to reinstate opioid therapy will require increased vigilance here in the clinic.  In the meantime continue follow-up with her primary care physicians for baseline medical care.  - ToxASSURE Select 13 (MW), Urine    ----------------------------------------------------------------------------------------------------------------------  I am having Wendy Mans. Renz "[name]" maintain her amphetamine-dextroamphetamine, busPIRone, sertraline,  clonazePAM, traZODone, amphetamine-dextroamphetamine, Latuda, naloxone, Calcium Carb-Cholecalciferol (CALCIUM 600 + D PO), Aspirin-Caffeine (BC FAST PAIN RELIEF ARTHRITIS PO), Podiatric Products (GOLD BOND FOOT EX), hydrOXYzine, betamethasone valerate, ibuprofen, phenazopyridine, valsartan, sucralfate, Oxycodone HCl, cyclobenzaprine, Trelegy Ellipta, ipratropium-albuterol, albuterol, and dexlansoprazole.   No orders of the defined types were placed in this encounter.  Patient's Medications  New Prescriptions   No medications on file  Previous Medications   ALBUTEROL (VENTOLIN HFA)  108 (90 BASE) MCG/ACT INHALER    INHALE 2 PUFFS INTO THE LUNGS EVERY 6 HOURS AS NEEDED FOR WHEEZING OR SHORTNESS OF BREATH   AMPHETAMINE-DEXTROAMPHETAMINE (ADDERALL) 15 MG TABLET    Take 15 mg by mouth daily in the afternoon.   AMPHETAMINE-DEXTROAMPHETAMINE (ADDERALL) 30 MG TABLET    Take 30 mg by mouth 2 (two) times daily.   ASPIRIN-CAFFEINE (BC FAST PAIN RELIEF ARTHRITIS PO)    Take 1 packet by mouth 2 (two) times daily as needed (pain).   BETAMETHASONE VALERATE (VALISONE) 0.1 % CREAM    Apply topically 2 (two) times daily.   BUSPIRONE (BUSPAR) 30 MG TABLET    Take 30 mg by mouth 2 (two) times daily.    CALCIUM CARB-CHOLECALCIFEROL (CALCIUM 600 + D PO)    Take 1 tablet by mouth 2 (two) times daily.   CLONAZEPAM (KLONOPIN) 1 MG TABLET    Take 1-2 mg by mouth 6 (six) times daily.   CYCLOBENZAPRINE (FLEXERIL) 10 MG TABLET    Take 1 tablet (10 mg total) by mouth 3 (three) times daily as needed for muscle spasms.   DEXLANSOPRAZOLE (DEXILANT) 60 MG CAPSULE    TAKE 1 CAPSULE BY MOUTH DAILY   FLUTICASONE-UMECLIDIN-VILANT (TRELEGY ELLIPTA) 100-62.5-25 MCG/ACT AEPB    Inhale 1 puff into the lungs daily.   HYDROXYZINE (ATARAX) 50 MG TABLET    Take by mouth.   IBUPROFEN (ADVIL) 800 MG TABLET    TAKE 1 TABLET BY MOUTH EVERY 8 HOURS AS NEEDED   IPRATROPIUM-ALBUTEROL (DUONEB) 0.5-2.5 (3) MG/3ML SOLN    Take 3 mLs by nebulization  every 4 (four) hours as needed.   LATUDA 80 MG TABS TABLET    Take 80 mg by mouth daily.   NALOXONE (NARCAN) NASAL SPRAY 4 MG/0.1 ML    As directed for opioid induced respiratory depression   OXYCODONE HCL 10 MG TABS    Take by mouth every 4 (four) hours as needed.   PHENAZOPYRIDINE (PYRIDIUM) 100 MG TABLET    Take 1 tablet (100 mg total) by mouth 3 (three) times daily as needed for pain.   PODIATRIC PRODUCTS (GOLD BOND FOOT EX)    Apply 1 application topically daily.   SERTRALINE (ZOLOFT) 100 MG TABLET    Take 200 mg by mouth daily.   SUCRALFATE (CARAFATE) 1 G TABLET    Take 1 tablet (1 g total) by mouth 4 (four) times daily for 10 days.   TRAZODONE (DESYREL) 100 MG TABLET    Take 400 mg by mouth at bedtime.    VALSARTAN (DIOVAN) 80 MG TABLET    TAKE 1 TABLET BY MOUTH DAILY  Modified Medications   No medications on file  Discontinued Medications   No medications on file   ----------------------------------------------------------------------------------------------------------------------  Follow-up: No follow-ups on file.    Yevette Edwards, MD

## 2023-08-10 LAB — TOXASSURE SELECT 13 (MW), URINE

## 2023-08-28 ENCOUNTER — Other Ambulatory Visit: Payer: Self-pay | Admitting: *Deleted

## 2023-08-28 ENCOUNTER — Telehealth: Payer: Self-pay | Admitting: Anesthesiology

## 2023-08-28 NOTE — Telephone Encounter (Signed)
 Med refill request sent to Dr Welton Hall.

## 2023-08-28 NOTE — Telephone Encounter (Signed)
 Patient is calling asking Dr Welton Hall to send script for meds. She has VV on Thurs with him

## 2023-08-29 ENCOUNTER — Other Ambulatory Visit: Payer: Self-pay | Admitting: Family Medicine

## 2023-08-29 DIAGNOSIS — K219 Gastro-esophageal reflux disease without esophagitis: Secondary | ICD-10-CM

## 2023-08-30 MED ORDER — OXYCODONE HCL 10 MG PO TABS: 10.0000 mg | ORAL_TABLET | ORAL | 0 refills | Status: DC | PRN

## 2023-08-31 ENCOUNTER — Encounter: Payer: Self-pay | Admitting: Anesthesiology

## 2023-08-31 ENCOUNTER — Ambulatory Visit: Attending: Anesthesiology | Admitting: Anesthesiology

## 2023-08-31 DIAGNOSIS — M25552 Pain in left hip: Secondary | ICD-10-CM

## 2023-08-31 DIAGNOSIS — G894 Chronic pain syndrome: Secondary | ICD-10-CM

## 2023-08-31 DIAGNOSIS — M545 Low back pain, unspecified: Secondary | ICD-10-CM

## 2023-08-31 DIAGNOSIS — T8484XS Pain due to internal orthopedic prosthetic devices, implants and grafts, sequela: Secondary | ICD-10-CM

## 2023-08-31 DIAGNOSIS — M25551 Pain in right hip: Secondary | ICD-10-CM

## 2023-08-31 DIAGNOSIS — Z96651 Presence of right artificial knee joint: Secondary | ICD-10-CM

## 2023-08-31 DIAGNOSIS — M5431 Sciatica, right side: Secondary | ICD-10-CM

## 2023-08-31 DIAGNOSIS — M25511 Pain in right shoulder: Secondary | ICD-10-CM

## 2023-08-31 DIAGNOSIS — M47816 Spondylosis without myelopathy or radiculopathy, lumbar region: Secondary | ICD-10-CM

## 2023-08-31 DIAGNOSIS — M542 Cervicalgia: Secondary | ICD-10-CM

## 2023-08-31 DIAGNOSIS — Z79891 Long term (current) use of opiate analgesic: Secondary | ICD-10-CM

## 2023-08-31 DIAGNOSIS — M5386 Other specified dorsopathies, lumbar region: Secondary | ICD-10-CM

## 2023-08-31 DIAGNOSIS — F1190 Opioid use, unspecified, uncomplicated: Secondary | ICD-10-CM

## 2023-08-31 DIAGNOSIS — M7918 Myalgia, other site: Secondary | ICD-10-CM

## 2023-08-31 MED ORDER — OXYCODONE HCL 10 MG PO TABS: 10.0000 mg | ORAL_TABLET | Freq: Three times a day (TID) | ORAL | 0 refills | Status: AC

## 2023-08-31 NOTE — Progress Notes (Signed)
 Virtual Visit via Video Note  I connected with Wendy Johnson on 08/31/23 at 11:00 AM EDT by a video enabled telemedicine application and verified that I am speaking with the correct person using two identifiers.  Location: Patient: Home Provider: [facility] control center   I discussed the limitations of evaluation and management by telemedicine and the availability of in person appointments. The patient expressed understanding and agreed to proceed.  History of Present Illness: I spoke with Wendy Johnson via telephone as she was unable to do the video portion of the conference.  She reports that she is having significant right lower extremity pain affecting the calf and sciatica symptoms that she has had previously.  She is getting some cramping of the calf and some numbness and tingling in the right lateral foot.  The pain radiates from the right lower back down into the foot is worse with prolonged standing.  She has some bilateral hip pain and occasional left leg pain.  She continues to have chronic cervical neck pain as well.  She takes her medications as prescribed and denies any diverting or illicit use.  She has been on oxycodone  averaging 2-3 times a day recently.  She denies any side effects with the medication.  She recently had a urine drug screen which was negative for amphetamine  Klonopin  or opioid.  This was appropriate under the circumstances.  Otherwise she is in her usual state of health with no new changes in lower extremity strength function bowel or bladder function.  She is trying to do her physical therapy exercises at home but has been limited secondary to the recent cold but is trying to be more active.  She has lost over 100 pounds she reports.  Review of systems: General: No fevers or chills Pulmonary: No shortness of breath or dyspnea Cardiac: No angina or palpitations or lightheadedness GI: No abdominal pain or constipation Psych: No depression     Observations/Objective:  Current Outpatient Medications:    [START ON 09/04/2023] Oxycodone  HCl 10 MG TABS, Take 1 tablet (10 mg total) by mouth in the morning, at noon, and at bedtime., Disp: 90 tablet, Rfl: 0   albuterol  (VENTOLIN  HFA) 108 (90 Base) MCG/ACT inhaler, INHALE 2 PUFFS INTO THE LUNGS EVERY 6 HOURS AS NEEDED FOR WHEEZING OR SHORTNESS OF BREATH, Disp: 8.5 g, Rfl: 2   amphetamine -dextroamphetamine  (ADDERALL) 15 MG tablet, Take 15 mg by mouth daily in the afternoon., Disp: , Rfl:    amphetamine -dextroamphetamine  (ADDERALL) 30 MG tablet, Take 30 mg by mouth 2 (two) times daily., Disp: , Rfl:    Aspirin -Caffeine  (BC FAST PAIN RELIEF ARTHRITIS PO), Take 1 packet by mouth 2 (two) times daily as needed (pain). (Patient not taking: Reported on 08/07/2023), Disp: , Rfl:    betamethasone  valerate (VALISONE ) 0.1 % cream, Apply topically 2 (two) times daily. (Patient not taking: Reported on 08/07/2023), Disp: 30 g, Rfl: 0   busPIRone  (BUSPAR ) 30 MG tablet, Take 30 mg by mouth 2 (two) times daily. , Disp: , Rfl:    Calcium  Carb-Cholecalciferol (CALCIUM  600 + D PO), Take 1 tablet by mouth 2 (two) times daily. (Patient not taking: Reported on 08/07/2023), Disp: , Rfl:    clonazePAM  (KLONOPIN ) 1 MG tablet, Take 1-2 mg by mouth 6 (six) times daily., Disp: , Rfl:    cyclobenzaprine  (FLEXERIL ) 10 MG tablet, Take 1 tablet (10 mg total) by mouth 3 (three) times daily as needed for muscle spasms. (Patient not taking: Reported on 08/07/2023), Disp: 30 tablet,  Rfl: 0   dexlansoprazole  (DEXILANT ) 60 MG capsule, TAKE 1 CAPSULE BY MOUTH DAILY, Disp: 90 capsule, Rfl: 0   Fluticasone -Umeclidin-Vilant (TRELEGY ELLIPTA ) 100-62.5-25 MCG/ACT AEPB, Inhale 1 puff into the lungs daily., Disp: 1 each, Rfl: 11   hydrOXYzine  (ATARAX ) 50 MG tablet, Take by mouth. (Patient not taking: Reported on 08/07/2023), Disp: , Rfl:    ibuprofen  (ADVIL ) 800 MG tablet, TAKE 1 TABLET BY MOUTH EVERY 8 HOURS AS NEEDED (Patient not taking: Reported  on 08/07/2023), Disp: 90 tablet, Rfl: 0   ipratropium-albuterol  (DUONEB) 0.5-2.5 (3) MG/3ML SOLN, Take 3 mLs by nebulization every 4 (four) hours as needed. (Patient not taking: Reported on 08/07/2023), Disp: 360 mL, Rfl: 1   LATUDA  80 MG TABS tablet, Take 80 mg by mouth daily., Disp: , Rfl:    naloxone  (NARCAN ) nasal spray 4 mg/0.1 mL, As directed for opioid induced respiratory depression, Disp: 1 each, Rfl: 1   [START ON 09/09/2023] Oxycodone  HCl 10 MG TABS, Take 1 tablet (10 mg total) by mouth 3 (three) times daily., Disp: 90 tablet, Rfl: 0   phenazopyridine  (PYRIDIUM ) 100 MG tablet, Take 1 tablet (100 mg total) by mouth 3 (three) times daily as needed for pain. (Patient not taking: Reported on 08/07/2023), Disp: 10 tablet, Rfl: 0   Podiatric Products (GOLD BOND FOOT EX), Apply 1 application topically daily. (Patient not taking: Reported on 08/07/2023), Disp: , Rfl:    sertraline  (ZOLOFT ) 100 MG tablet, Take 200 mg by mouth daily., Disp: , Rfl:    sucralfate  (CARAFATE ) 1 g tablet, Take 1 tablet (1 g total) by mouth 4 (four) times daily for 10 days., Disp: 40 tablet, Rfl: 0   traZODone  (DESYREL ) 100 MG tablet, Take 400 mg by mouth at bedtime. , Disp: , Rfl:    valsartan  (DIOVAN ) 80 MG tablet, TAKE 1 TABLET BY MOUTH DAILY (Patient not taking: Reported on 08/07/2023), Disp: 30 tablet, Rfl: 0   Past Medical History:  Diagnosis Date   ADHD (attention deficit hyperactivity disorder)    Anemia 09/10/2021   Anxiety    AR (allergic rhinitis)    Arthritis    Benign hypertension    NO MEDS   Bipolar disorder (HCC)    Chronic back pain    Chronic constipation    Chronic insomnia    COPD (chronic obstructive pulmonary disease) (HCC)    Deaf    RIGHT EAR   Decreased dorsalis pedis pulse    Depression    Dyslipidemia    Fatty liver    GERD (gastroesophageal reflux disease)    Hepatomegaly    High cholesterol    Hot flashes    Migraine with aura    OCD (obsessive compulsive disorder)    Pain in  joint, shoulder region 12/19/2018   Plantar warts    Sciatica, right side 02/18/2019   Severe obesity (HCC)    Shortness of breath dyspnea    Sleep apnea    NO CPAP   Tobacco use    Trochanteric bursitis of right hip    Assessment and Plan:  1. Low back pain at multiple sites   2. Pain syndrome, chronic   3. Chronic pain syndrome   4. Pain of both hip joints   5. Cervicalgia   6. Low back derangement syndrome   7. Chronic, continuous use of opioids   8. Facet arthritis of lumbar region   9. Pain due to total right knee replacement, sequela   10. Gluteal pain   11. Sciatica, right  side   12. Pain in joint of right shoulder    Based on our conversation upon review of the [place]  practitioner database information is appropriate refill her medications.  Going to place her on oxycodone  10 mg tablets 3 times a day.  In the past she has been on 4 to 5 tablets/day but I feel that this regimen is more appropriate for current circumstances.  I have encouraged her to continue efforts at stretching strengthening exercises and maintain her present weight.  Will schedule her for an epidural to help with the right lower extremity sciatica as she has responded favorably to these in the past and failed more conservative therapy.  She generally gets about 75 to 100% relief of the sciatica symptoms for about 2 months and a significant reduction in her low back pain.  She still has facet issues with her back.  Continue follow-up with her primary care physician for baseline medical care with scheduled return to clinic in 6 weeks. Follow Up Instructions:    I discussed the assessment and treatment plan with the patient. The patient was provided an opportunity to ask questions and all were answered. The patient agreed with the plan and demonstrated an understanding of the instructions.   The patient was advised to call back or seek an in-person evaluation if the symptoms worsen or if the condition  fails to improve as anticipated.  I provided 30 minutes of non-face-to-face time during this encounter.   Zula Hitch, MD

## 2023-09-30 ENCOUNTER — Emergency Department
Admission: EM | Admit: 2023-09-30 | Discharge: 2023-09-30 | Disposition: A | Discharge status: Home / Self Care | Attending: Emergency Medicine | Admitting: Emergency Medicine

## 2023-09-30 ENCOUNTER — Other Ambulatory Visit: Payer: Self-pay

## 2023-09-30 ENCOUNTER — Emergency Department

## 2023-09-30 DIAGNOSIS — S0990XA Unspecified injury of head, initial encounter: Secondary | ICD-10-CM | POA: Diagnosis present

## 2023-09-30 DIAGNOSIS — F10920 Alcohol use, unspecified with intoxication, uncomplicated: Secondary | ICD-10-CM | POA: Diagnosis not present

## 2023-09-30 DIAGNOSIS — Z23 Encounter for immunization: Secondary | ICD-10-CM | POA: Insufficient documentation

## 2023-09-30 DIAGNOSIS — S0081XA Abrasion of other part of head, initial encounter: Secondary | ICD-10-CM | POA: Insufficient documentation

## 2023-09-30 LAB — CBC WITH DIFFERENTIAL/PLATELET
Abs Immature Granulocytes: 0.01 10*3/uL (ref 0.00–0.07)
Basophils Absolute: 0.1 10*3/uL (ref 0.0–0.1)
Basophils Relative: 1 %
Eosinophils Absolute: 0.3 10*3/uL (ref 0.0–0.5)
Eosinophils Relative: 3 %
HCT: 45.3 % (ref 36.0–46.0)
Hemoglobin: 15.5 g/dL — ABNORMAL HIGH (ref 12.0–15.0)
Immature Granulocytes: 0 %
Lymphocytes Relative: 32 %
Lymphs Abs: 2.7 10*3/uL (ref 0.7–4.0)
MCH: 29.9 pg (ref 26.0–34.0)
MCHC: 34.2 g/dL (ref 30.0–36.0)
MCV: 87.5 fL (ref 80.0–100.0)
Monocytes Absolute: 0.7 10*3/uL (ref 0.1–1.0)
Monocytes Relative: 8 %
Neutro Abs: 4.7 10*3/uL (ref 1.7–7.7)
Neutrophils Relative %: 56 %
Platelets: 333 10*3/uL (ref 150–400)
RBC: 5.18 MIL/uL — ABNORMAL HIGH (ref 3.87–5.11)
RDW: 13.9 % (ref 11.5–15.5)
WBC: 8.4 10*3/uL (ref 4.0–10.5)
nRBC: 0 % (ref 0.0–0.2)

## 2023-09-30 LAB — COMPREHENSIVE METABOLIC PANEL WITH GFR
ALT: 19 U/L (ref 0–44)
AST: 34 U/L (ref 15–41)
Albumin: 4.1 g/dL (ref 3.5–5.0)
Alkaline Phosphatase: 68 U/L (ref 38–126)
Anion gap: 11 (ref 5–15)
BUN: 5 mg/dL — ABNORMAL LOW (ref 6–20)
CO2: 22 mmol/L (ref 22–32)
Calcium: 9 mg/dL (ref 8.9–10.3)
Chloride: 108 mmol/L (ref 98–111)
Creatinine, Ser: 0.47 mg/dL (ref 0.44–1.00)
GFR, Estimated: >60 mL/min (ref >60)
Glucose, Bld: 77 mg/dL (ref 70–99)
Potassium: 3.9 mmol/L (ref 3.5–5.1)
Sodium: 141 mmol/L (ref 135–145)
Total Bilirubin: 0.5 mg/dL (ref 0.0–1.2)
Total Protein: 7.3 g/dL (ref 6.5–8.1)

## 2023-09-30 MED ORDER — DIPHENHYDRAMINE HCL 50 MG/ML IJ SOLN
50.0000 mg | Freq: Once | INTRAMUSCULAR | Status: AC
  Administered 2023-09-30: 50 mg via INTRAMUSCULAR
  Filled 2023-09-30: qty 1

## 2023-09-30 MED ORDER — LORAZEPAM 2 MG/ML IJ SOLN
2.0000 mg | Freq: Once | INTRAMUSCULAR | Status: AC
  Administered 2023-09-30: 2 mg via INTRAMUSCULAR
  Filled 2023-09-30: qty 1

## 2023-09-30 MED ORDER — TETANUS-DIPHTH-ACELL PERTUSSIS 5-2.5-18.5 LF-MCG/0.5 IM SUSY
0.5000 mL | PREFILLED_SYRINGE | Freq: Once | INTRAMUSCULAR | Status: AC
  Administered 2023-09-30: 0.5 mL via INTRAMUSCULAR
  Filled 2023-09-30: qty 0.5

## 2023-09-30 MED ORDER — LORAZEPAM 2 MG/ML IJ SOLN: 2.0000 mg | Freq: Once | INTRAMUSCULAR | Status: DC

## 2023-09-30 MED ORDER — HALOPERIDOL LACTATE 5 MG/ML IJ SOLN
5.0000 mg | Freq: Once | INTRAMUSCULAR | Status: AC
  Administered 2023-09-30: 5 mg via INTRAMUSCULAR
  Filled 2023-09-30: qty 1

## 2023-09-30 NOTE — ED Notes (Signed)
 Pt called out to use bathroom. Pt unable to stand. Pt was able to roll over and placed on bedpan. Pt voided small amount of urine and peri area cleansed. Pt went back to sleep and responds with eyes closed. NAD, Vitals WDL. BPD at bedside.

## 2023-09-30 NOTE — ED Notes (Signed)
 This RN got pt up at bedside. Pt was able to drink water  but gate was unsteady and wobbly. MD, Tan notified that pt will have to stay longer and be able to ambulate freely until she can be released. Pt returned to bed with x1 assist. NAD. BPD at bedside.

## 2023-09-30 NOTE — ED Notes (Signed)
 Pt combative and verbally aggressive to staff and BPD. Pt wont allow blood draw to be conducted and screaming to "leave her alone"

## 2023-09-30 NOTE — ED Notes (Signed)
 Pt back from CT. Pt asleep, vitals WDL. BPD at bedside.

## 2023-09-30 NOTE — ED Notes (Signed)
 Pt alert to touch and voice but goes back to sleep immediately.  Pt unable to stand. Vitals WDL. BPD at bedside

## 2023-09-30 NOTE — ED Notes (Signed)
 This RN attempted to get pt to stand and get up. Pt responds to voice and touch but goes back to sleep. Vitals WDL. BPD at bedside.

## 2023-09-30 NOTE — ED Notes (Signed)
 Patient is awake and alertx4. Patient ambulated to the bathroom and is steady.

## 2023-09-30 NOTE — Discharge Instructions (Signed)
 Your CT imaging and blood work was reassuring you can return to the ER for worsening symptoms or any other concerns
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
## 2023-09-30 NOTE — ED Notes (Signed)
 Pt back from CT
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
## 2023-12-06 ENCOUNTER — Ambulatory Visit: Admitting: Family Medicine

## 2023-12-12 ENCOUNTER — Ambulatory Visit: Admitting: Anesthesiology

## 2023-12-20 ENCOUNTER — Encounter: Payer: Self-pay | Admitting: Anesthesiology

## 2023-12-20 ENCOUNTER — Ambulatory Visit: Admitting: Anesthesiology

## 2023-12-20 ENCOUNTER — Other Ambulatory Visit: Payer: Self-pay | Admitting: Anesthesiology

## 2023-12-20 ENCOUNTER — Ambulatory Visit
Admission: RE | Admit: 2023-12-20 | Discharge: 2023-12-20 | Disposition: A | Discharge status: Home / Self Care | Source: Ambulatory Visit | Attending: Anesthesiology | Admitting: Anesthesiology

## 2023-12-20 VITALS — BP 145/98 | HR 97 | Temp 97.5°F | Resp 16 | Ht 61.0 in | Wt 168.0 lb

## 2023-12-20 DIAGNOSIS — G894 Chronic pain syndrome: Secondary | ICD-10-CM | POA: Insufficient documentation

## 2023-12-20 DIAGNOSIS — M545 Low back pain, unspecified: Secondary | ICD-10-CM | POA: Diagnosis not present

## 2023-12-20 DIAGNOSIS — M5136 Other intervertebral disc degeneration, lumbar region with discogenic back pain only: Secondary | ICD-10-CM

## 2023-12-20 DIAGNOSIS — M542 Cervicalgia: Secondary | ICD-10-CM

## 2023-12-20 DIAGNOSIS — M5431 Sciatica, right side: Secondary | ICD-10-CM

## 2023-12-20 DIAGNOSIS — M25552 Pain in left hip: Secondary | ICD-10-CM | POA: Insufficient documentation

## 2023-12-20 DIAGNOSIS — M25551 Pain in right hip: Secondary | ICD-10-CM | POA: Diagnosis not present

## 2023-12-20 MED ORDER — LIDOCAINE HCL (PF) 1 % IJ SOLN
5.0000 mL | Freq: Once | INTRAMUSCULAR | Status: AC
  Administered 2023-12-20 (×2): 5 mL via SUBCUTANEOUS
  Filled 2023-12-20: qty 5

## 2023-12-20 MED ORDER — TRIAMCINOLONE ACETONIDE 40 MG/ML IJ SUSP
40.0000 mg | Freq: Once | INTRAMUSCULAR | Status: AC
  Administered 2023-12-20 (×2): 40 mg
  Filled 2023-12-20: qty 1

## 2023-12-20 MED ORDER — MIDAZOLAM HCL 2 MG/2ML IJ SOLN
2.0000 mg | Freq: Once | INTRAMUSCULAR | Status: DC
  Filled 2023-12-20: qty 2

## 2023-12-20 MED ORDER — IOHEXOL 180 MG/ML  SOLN
10.0000 mL | Freq: Once | INTRAMUSCULAR | Status: AC | PRN
  Administered 2023-12-20 (×2): 10 mL via EPIDURAL
  Filled 2023-12-20: qty 20

## 2023-12-20 MED ORDER — SODIUM CHLORIDE 0.9% FLUSH
10.0000 mL | Freq: Once | INTRAVENOUS | Status: AC
  Administered 2023-12-20 (×2): 10 mL

## 2023-12-20 MED ORDER — ROPIVACAINE HCL 2 MG/ML IJ SOLN
10.0000 mL | Freq: Once | INTRAMUSCULAR | Status: AC
  Administered 2023-12-20 (×2): 10 mL via EPIDURAL
  Filled 2023-12-20: qty 20

## 2023-12-20 MED ORDER — LACTATED RINGERS IV SOLN: INTRAVENOUS | Status: DC

## 2023-12-20 NOTE — Progress Notes (Signed)
 Patient very jittery and talkative when in procedure room . She immediately jumped off bed and did not make appt per secretarty said that she needed to go.

## 2023-12-20 NOTE — Progress Notes (Signed)
 Nursing Pain Medication Assessment:  Safety precautions to be maintained throughout the outpatient stay will include: orient to surroundings, keep bed in low position, maintain call bell within reach at all times, provide assistance with transfer out of bed and ambulation.  Medication Inspection Compliance: Wendy Johnson did not comply with our request to bring her pills to be counted. She was reminded that bringing the medication bottles, even when empty, is a requirement.  Medication: None brought in. Pill/Patch Count: None available to be counted. Bottle Appearance: No container available. Did not bring bottle(s) to appointment. Filled Date: N/A Last Medication intake:  TodaySafety precautions to be maintained throughout the outpatient stay will include: orient to surroundings, keep bed in low position, maintain call bell within reach at all times, provide assistance with transfer out of bed and ambulation.

## 2023-12-21 ENCOUNTER — Telehealth: Payer: Self-pay | Admitting: *Deleted

## 2023-12-21 NOTE — Telephone Encounter (Signed)
 Post procedure call; unable to leave voicemail.

## 2023-12-23 LAB — TOXASSURE SELECT 13 (MW), URINE

## 2023-12-28 DIAGNOSIS — Z79899 Other long term (current) drug therapy: Secondary | ICD-10-CM | POA: Diagnosis not present

## 2024-01-03 ENCOUNTER — Encounter: Payer: Self-pay | Admitting: Anesthesiology

## 2024-01-03 NOTE — Progress Notes (Signed)
  Procedure: L5-S1 LESI with fluoroscopic guidance and without moderate sedation  NOTE: The risks, benefits, and expectations of the procedure have been discussed and explained to the patient who was understanding and in agreement with suggested treatment plan. No guarantees were made.  DESCRIPTION OF PROCEDURE: Lumbar epidural steroid injection with no IV Versed , EKG, blood pressure, pulse, and pulse oximetry monitoring. The procedure was performed with the patient in the prone position under fluoroscopic guidance.  Sterile prep x3 was initiated and I then injected subcutaneous lidocaine  to the overlying L5-S1 site after its fluoroscopic identifictation.  Using strict aseptic technique, I then advanced an 18-gauge Tuohy epidural needle in the midline using interlaminar approach via loss-of-resistance to saline technique. There was negative aspiration for heme or  CSF.  I then confirmed position with both AP and Lateral fluoroscan.  2 cc of contrast dye were injected and a  total of 5 mL of Preservative-Free normal saline mixed with 40 mg of Kenalog  and 1cc Ropicaine 0.2 percent were injected incrementally via the  epidurally placed needle. The needle was removed. The patient tolerated the injection well and was convalesced and discharged to home in stable condition. Should the patient have any post procedure difficulty they have been instructed on how to contact us  for assistance.

## 2024-01-03 NOTE — Progress Notes (Signed)
 Subjective:  Patient ID: Wendy Johnson, female    DOB: 1965-11-19  Age: 58 y.o. MRN: [id number]  CC: Back Pain (lower)   Procedure: L5-S1 epidural steroid under fluoroscopic guidance with no sedation  HPI Guerline Happ Reffner presents for reevaluation.  She continues to have complaints of severe right greater than left lower extremity sciatica with radiating pain down the posterior lateral aspect of her leg going into the calf and right foot.  This is worse than on the left side.  She occasionally gets numbness and cramping of the right side worse than on the left side.  Despite efforts at conservative management with physical therapy and stretching strengthening she has had no relief.  She last had an epidural back in October 2020 for that gave her significant improvement in her sciatica symptoms lasting several months before she has had recurrence over the past month or 2.  Otherwise she is in her usual state of health.  Outpatient Medications Prior to Visit  Medication Sig Dispense Refill   albuterol  (VENTOLIN  HFA) 108 (90 Base) MCG/ACT inhaler INHALE 2 PUFFS INTO THE LUNGS EVERY 6 HOURS AS NEEDED FOR WHEEZING OR SHORTNESS OF BREATH (Patient not taking: Reported on 12/20/2023) 8.5 g 2   amphetamine -dextroamphetamine  (ADDERALL) 15 MG tablet Take 15 mg by mouth daily in the afternoon. (Patient not taking: Reported on 12/20/2023)     amphetamine -dextroamphetamine  (ADDERALL) 30 MG tablet Take 30 mg by mouth 2 (two) times daily. (Patient not taking: Reported on 12/20/2023)     Aspirin -Caffeine  (BC FAST PAIN RELIEF ARTHRITIS PO) Take 1 packet by mouth 2 (two) times daily as needed (pain). (Patient not taking: Reported on 12/20/2023)     betamethasone  valerate (VALISONE ) 0.1 % cream Apply topically 2 (two) times daily. (Patient not taking: Reported on 12/20/2023) 30 g 0   busPIRone  (BUSPAR ) 30 MG tablet Take 30 mg by mouth 2 (two) times daily.  (Patient not taking: Reported on 12/20/2023)     Calcium   Carb-Cholecalciferol (CALCIUM  600 + D PO) Take 1 tablet by mouth 2 (two) times daily. (Patient not taking: Reported on 12/20/2023)     clonazePAM  (KLONOPIN ) 1 MG tablet Take 1-2 mg by mouth 6 (six) times daily. (Patient not taking: Reported on 12/20/2023)     cyclobenzaprine  (FLEXERIL ) 10 MG tablet Take 1 tablet (10 mg total) by mouth 3 (three) times daily as needed for muscle spasms. (Patient not taking: Reported on 12/20/2023) 30 tablet 0   dexlansoprazole  (DEXILANT ) 60 MG capsule TAKE 1 CAPSULE BY MOUTH DAILY (Patient not taking: Reported on 12/20/2023) 90 capsule 0   Fluticasone -Umeclidin-Vilant (TRELEGY ELLIPTA ) 100-62.5-25 MCG/ACT AEPB Inhale 1 puff into the lungs daily. (Patient not taking: Reported on 12/20/2023) 1 each 11   hydrOXYzine  (ATARAX ) 50 MG tablet Take by mouth. (Patient not taking: Reported on 12/20/2023)     ibuprofen  (ADVIL ) 800 MG tablet TAKE 1 TABLET BY MOUTH EVERY 8 HOURS AS NEEDED (Patient not taking: Reported on 12/20/2023) 90 tablet 0   ipratropium-albuterol  (DUONEB) 0.5-2.5 (3) MG/3ML SOLN Take 3 mLs by nebulization every 4 (four) hours as needed. (Patient not taking: Reported on 12/20/2023) 360 mL 1   LATUDA  80 MG TABS tablet Take 80 mg by mouth daily. (Patient not taking: Reported on 12/20/2023)     naloxone  (NARCAN ) nasal spray 4 mg/0.1 mL As directed for opioid induced respiratory depression (Patient not taking: Reported on 12/20/2023) 1 each 1   phenazopyridine  (PYRIDIUM ) 100 MG tablet Take 1 tablet (100 mg total) by mouth  3 (three) times daily as needed for pain. (Patient not taking: Reported on 12/20/2023) 10 tablet 0   Podiatric Products (GOLD BOND FOOT EX) Apply 1 application topically daily. (Patient not taking: Reported on 12/20/2023)     sertraline  (ZOLOFT ) 100 MG tablet Take 200 mg by mouth daily. (Patient not taking: Reported on 12/20/2023)     sucralfate  (CARAFATE ) 1 g tablet Take 1 tablet (1 g total) by mouth 4 (four) times daily for 10 days. (Patient not taking: Reported  on 12/20/2023) 40 tablet 0   traZODone  (DESYREL ) 100 MG tablet Take 400 mg by mouth at bedtime.  (Patient not taking: Reported on 12/20/2023)     valsartan  (DIOVAN ) 80 MG tablet TAKE 1 TABLET BY MOUTH DAILY (Patient not taking: Reported on 12/20/2023) 30 tablet 0   No facility-administered medications prior to visit.    Review of Systems CNS: No confusion or sedation Cardiac: No angina or palpitations GI: No abdominal pain or constipation Constitutional: No nausea vomiting fevers or chills  Objective:  BP (!) 145/98   Pulse 97   Temp (!) 97.5 F (36.4 C) (Temporal)   Resp 16   Ht 5' 1 (1.549 m)   Wt 168 lb (76.2 kg)   LMP 09/26/2014 Comment: Total  SpO2 98%   BMI 31.74 kg/m    BP Readings from Last 3 Encounters:  12/20/23 (!) 145/98  09/30/23 134/82  08/07/23 (!) 164/105     Wt Readings from Last 3 Encounters:  12/20/23 168 lb (76.2 kg)  09/30/23 164 lb 0.4 oz (74.4 kg)  08/07/23 164 lb (74.4 kg)     Physical Exam Pt is alert and oriented PERRL EOMI HEART IS RRR no murmur or rub LCTA no wheezing or rales MUSCULOSKELETAL reveals some paraspinous muscle tenderness but no overt trigger points.  She walks with an antalgic gait with a positive straight leg raise on the right side.  Labs  Lab Results  Component Value Date   HGBA1C 4.9 11/16/2020   HGBA1C 5.3 06/04/2019   HGBA1C 5.3 10/04/2018   Lab Results  Component Value Date   MICROALBUR 0.2 06/04/2019   LDLCALC 106 (H) 11/16/2020   CREATININE 0.47 09/30/2023    -------------------------------------------------------------------------------------------------------------------- Lab Results  Component Value Date   WBC 8.4 09/30/2023   HGB 15.5 (H) 09/30/2023   HCT 45.3 09/30/2023   PLT 333 09/30/2023   GLUCOSE 77 09/30/2023   CHOL 175 11/16/2020   TRIG 188 (H) 11/16/2020   HDL 39 (L) 11/16/2020   LDLCALC 106 (H) 11/16/2020   ALT 19 09/30/2023   AST 34 09/30/2023   NA 141 09/30/2023   K 3.9  09/30/2023   CL 108 09/30/2023   CREATININE 0.47 09/30/2023   BUN 5 (L) 09/30/2023   CO2 22 09/30/2023   TSH 0.769 02/03/2016   INR 0.97 10/04/2016   HGBA1C 4.9 11/16/2020   MICROALBUR 0.2 06/04/2019    --------------------------------------------------------------------------------------------------------------------- DG PAIN CLINIC C-ARM 1-60 MIN NO REPORT Result Date: 12/20/2023 Fluoro was used, but no Radiologist interpretation will be provided. Please refer to NOTES tab for provider progress note.    Assessment & Plan:   Adabella Stanis was seen today for back pain.  Diagnoses and all orders for this visit:  Low back pain at multiple sites -     ToxASSURE Select 13 (MW), Urine  Pain syndrome, chronic -     ToxASSURE Select 13 (MW), Urine  Chronic pain syndrome -     ToxASSURE Select 13 (MW), Urine  Pain of both hip joints -     ToxASSURE Select 13 (MW), Urine  Cervicalgia -     ToxASSURE Select 13 (MW), Urine  Sciatica, right side -     triamcinolone  acetonide (KENALOG -40) injection 40 mg -     sodium chloride  flush (NS) 0.9 % injection 10 mL -     ropivacaine  (PF) 2 mg/mL (0.2%) (NAROPIN ) injection 10 mL -     midazolam  (VERSED ) injection 2 mg -     lidocaine  (PF) (XYLOCAINE ) 1 % injection 5 mL -     lactated ringers  infusion -     iohexol  (OMNIPAQUE ) 180 MG/ML injection 10 mL -     ToxASSURE Select 13 (MW), Urine  Degeneration of intervertebral disc of lumbar region with discogenic back pain -     ToxASSURE Select 13 (MW), Urine        ----------------------------------------------------------------------------------------------------------------------  Problem List Items Addressed This Visit       Unprioritized   DDD (degenerative disc disease), lumbar   Relevant Orders   ToxASSURE Select 13 (MW), Urine (Completed)   Pain syndrome, chronic   Relevant Orders   ToxASSURE Select 13 (MW), Urine (Completed)   Sciatica, right side   Relevant  Medications   midazolam  (VERSED ) injection 2 mg   lactated ringers  infusion   Other Relevant Orders   ToxASSURE Select 13 (MW), Urine (Completed)   Other Visit Diagnoses       Low back pain at multiple sites    -  Primary   Relevant Medications   triamcinolone  acetonide (KENALOG -40) injection 40 mg (Completed)   Other Relevant Orders   ToxASSURE Select 13 (MW), Urine (Completed)     Chronic pain syndrome       Relevant Medications   triamcinolone  acetonide (KENALOG -40) injection 40 mg (Completed)   ropivacaine  (PF) 2 mg/mL (0.2%) (NAROPIN ) injection 10 mL (Completed)   lidocaine  (PF) (XYLOCAINE ) 1 % injection 5 mL (Completed)   Other Relevant Orders   ToxASSURE Select 13 (MW), Urine (Completed)     Pain of both hip joints       Relevant Orders   ToxASSURE Select 13 (MW), Urine (Completed)     Cervicalgia       Relevant Orders   ToxASSURE Select 13 (MW), Urine (Completed)         ----------------------------------------------------------------------------------------------------------------------  1. Low back pain at multiple sites (Primary) As above and continue with core stretching strengthening exercises. - ToxASSURE Select 13 (MW), Urine  2. Pain syndrome, chronic As above - ToxASSURE Select 13 (MW), Urine  3. Chronic pain syndrome  - ToxASSURE Select 13 (MW), Urine  4. Pain of both hip joints  - ToxASSURE Select 13 (MW), Urine  5. Cervicalgia  - ToxASSURE Select 13 (MW), Urine  6. Sciatica, right side Will proceed with a lumbar epidural steroid today.  She has had these in the past with good success.  We gone over the risks and benefits of the procedure with her in full detail.  I want her to continue with core stretching strengthening exercises and we will schedule her for return to clinic in 1 month. - triamcinolone  acetonide (KENALOG -40) injection 40 mg - sodium chloride  flush (NS) 0.9 % injection 10 mL - ropivacaine  (PF) 2 mg/mL (0.2%) (NAROPIN )  injection 10 mL - midazolam  (VERSED ) injection 2 mg - lidocaine  (PF) (XYLOCAINE ) 1 % injection 5 mL - lactated ringers  infusion - iohexol  (OMNIPAQUE ) 180 MG/ML injection 10 mL - ToxASSURE Select 13 (MW), Urine  7. Degeneration of intervertebral disc of lumbar region with discogenic back pain As above - ToxASSURE Select 13 (MW), Urine    ----------------------------------------------------------------------------------------------------------------------  I am having Sharlet PARAS. Orosz [name] maintain her amphetamine -dextroamphetamine , busPIRone , sertraline , clonazePAM , traZODone , amphetamine -dextroamphetamine , Latuda , naloxone , Calcium  Carb-Cholecalciferol (CALCIUM  600 + D PO), Aspirin -Caffeine  (BC FAST PAIN RELIEF ARTHRITIS PO), Podiatric Products (GOLD BOND FOOT EX), hydrOXYzine , betamethasone  valerate, ibuprofen , phenazopyridine , valsartan , sucralfate , cyclobenzaprine , Trelegy Ellipta , ipratropium-albuterol , albuterol , and dexlansoprazole . We administered triamcinolone  acetonide, sodium chloride  flush, ropivacaine  (PF) 2 mg/mL (0.2%), lidocaine  (PF), and iohexol .   Meds ordered this encounter  Medications   triamcinolone  acetonide (KENALOG -40) injection 40 mg   sodium chloride  flush (NS) 0.9 % injection 10 mL   ropivacaine  (PF) 2 mg/mL (0.2%) (NAROPIN ) injection 10 mL   midazolam  (VERSED ) injection 2 mg   lidocaine  (PF) (XYLOCAINE ) 1 % injection 5 mL   lactated ringers  infusion   iohexol  (OMNIPAQUE ) 180 MG/ML injection 10 mL   Patient's Medications  New Prescriptions   No medications on file  Previous Medications   ALBUTEROL  (VENTOLIN  HFA) 108 (90 BASE) MCG/ACT INHALER    INHALE 2 PUFFS INTO THE LUNGS EVERY 6 HOURS AS NEEDED FOR WHEEZING OR SHORTNESS OF BREATH   AMPHETAMINE -DEXTROAMPHETAMINE  (ADDERALL) 15 MG TABLET    Take 15 mg by mouth daily in the afternoon.   AMPHETAMINE -DEXTROAMPHETAMINE  (ADDERALL) 30 MG TABLET    Take 30 mg by mouth 2 (two) times daily.   ASPIRIN -CAFFEINE  (BC  FAST PAIN RELIEF ARTHRITIS PO)    Take 1 packet by mouth 2 (two) times daily as needed (pain).   BETAMETHASONE  VALERATE (VALISONE ) 0.1 % CREAM    Apply topically 2 (two) times daily.   BUSPIRONE  (BUSPAR ) 30 MG TABLET    Take 30 mg by mouth 2 (two) times daily.    CALCIUM  CARB-CHOLECALCIFEROL (CALCIUM  600 + D PO)    Take 1 tablet by mouth 2 (two) times daily.   CLONAZEPAM  (KLONOPIN ) 1 MG TABLET    Take 1-2 mg by mouth 6 (six) times daily.   CYCLOBENZAPRINE  (FLEXERIL ) 10 MG TABLET    Take 1 tablet (10 mg total) by mouth 3 (three) times daily as needed for muscle spasms.   DEXLANSOPRAZOLE  (DEXILANT ) 60 MG CAPSULE    TAKE 1 CAPSULE BY MOUTH DAILY   FLUTICASONE -UMECLIDIN-VILANT (TRELEGY ELLIPTA ) 100-62.5-25 MCG/ACT AEPB    Inhale 1 puff into the lungs daily.   HYDROXYZINE  (ATARAX ) 50 MG TABLET    Take by mouth.   IBUPROFEN  (ADVIL ) 800 MG TABLET    TAKE 1 TABLET BY MOUTH EVERY 8 HOURS AS NEEDED   IPRATROPIUM-ALBUTEROL  (DUONEB) 0.5-2.5 (3) MG/3ML SOLN    Take 3 mLs by nebulization every 4 (four) hours as needed.   LATUDA  80 MG TABS TABLET    Take 80 mg by mouth daily.   NALOXONE  (NARCAN ) NASAL SPRAY 4 MG/0.1 ML    As directed for opioid induced respiratory depression   PHENAZOPYRIDINE  (PYRIDIUM ) 100 MG TABLET    Take 1 tablet (100 mg total) by mouth 3 (three) times daily as needed for pain.   PODIATRIC PRODUCTS (GOLD BOND FOOT EX)    Apply 1 application topically daily.   SERTRALINE  (ZOLOFT ) 100 MG TABLET    Take 200 mg by mouth daily.   SUCRALFATE  (CARAFATE ) 1 G TABLET    Take 1 tablet (1 g total) by mouth 4 (four) times daily for 10 days.   TRAZODONE  (DESYREL ) 100 MG TABLET    Take 400 mg by mouth at bedtime.  VALSARTAN  (DIOVAN ) 80 MG TABLET    TAKE 1 TABLET BY MOUTH DAILY  Modified Medications   No medications on file  Discontinued Medications   No medications on file    ----------------------------------------------------------------------------------------------------------------------  Follow-up: No follow-ups on file.    Lynwood KANDICE Clause, MD

## 2024-01-26 ENCOUNTER — Ambulatory Visit: Payer: Self-pay

## 2024-01-26 NOTE — Telephone Encounter (Signed)
 FYI Only or Action Required?: Action required by provider: Heading to ED, requesting refill of trelegy and albuterol , scheduled appt for Monday for refills.  Patient was last seen in primary care on 03/28/2023 by Bernardo Fend, DO.  Called Nurse Triage reporting Shortness of Breath.  Symptoms began several days ago.  Interventions attempted: Rest, hydration, or home remedies.  Symptoms are: rapidly worsening.  Triage Disposition: Go to ED Now (Notify PCP)  Patient/caregiver understands and will follow disposition?: Yes     Per PAS, pt disconnected before transfer to nurse triage. Called pt back and triaged.  Copied from CRM [id number]. Topic: Clinical - Red Word Triage >> Jan 26, 2024 12:20 PM Turkey B wrote: [facility] that prompted transfer to Nurse Triage: Patient having hard time breathing , says needs inhaler Reason for Disposition  [1] MODERATE difficulty breathing (e.g., speaks in phrases, SOB even at rest, pulse 100-120) AND [2] NEW-onset or WORSE than normal  Answer Assessment - Initial Assessment Questions Advised pt go to ED for immediate care with SOB, scheduled appt for Monday to follow up with PCP office for worsening SOB than usual and to get refills of meds. Pt requesting refill for trelegy and albuterol . Pt states intent to go to ED.     1. RESPIRATORY STATUS: Describe your breathing? (e.g., wheezing, shortness of breath, unable to speak, severe coughing)      Breathing worse than usual Don't think wheezing Need to catch my breath, any time even sitting around Speaking in full sentences 2. ONSET: When did this breathing problem begin?      Ain't been to doctor in long time, hx nebulizers/inhalers, have COPD, not breathing too good Worsening started yesterday 3. PATTERN Does the difficult breathing come and go, or has it been constant since it started?      constant 4. SEVERITY: How bad is your breathing? (e.g., mild, moderate, severe)       Moderate compared to usual 5. RECURRENT SYMPTOM: Have you had difficulty breathing before? If Yes, ask: When was the last time? and What happened that time?      COPD 6. CARDIAC HISTORY: Do you have any history of heart disease? (e.g., heart attack, angina, bypass surgery, angioplasty)      No hx blood clots in legs or lungs 7. LUNG HISTORY: Do you have any history of lung disease?  (e.g., pulmonary embolus, asthma, emphysema)     COPD 9. OTHER SYMPTOMS: Do you have any other symptoms? (e.g., chest pain, cough, dizziness, fever, runny nose)     No chest pain, dizziness, weakness Not coughing No fever, runny nose  No inhalers, haven't been to PCP in long time, what can I do now, can't breathe  Protocols used: Breathing Difficulty-A-AH

## 2024-01-29 ENCOUNTER — Ambulatory Visit: Admitting: Nurse Practitioner

## 2024-01-29 ENCOUNTER — Ambulatory Visit: Payer: Self-pay

## 2024-01-29 ENCOUNTER — Ambulatory Visit: Admitting: Family Medicine

## 2024-01-29 ENCOUNTER — Encounter: Payer: Self-pay | Admitting: Family Medicine

## 2024-01-29 VITALS — BP 136/88 | HR 84 | Resp 16 | Ht 61.0 in | Wt 179.0 lb

## 2024-01-29 DIAGNOSIS — S61011A Laceration without foreign body of right thumb without damage to nail, initial encounter: Secondary | ICD-10-CM | POA: Diagnosis not present

## 2024-01-29 DIAGNOSIS — J441 Chronic obstructive pulmonary disease with (acute) exacerbation: Secondary | ICD-10-CM

## 2024-01-29 DIAGNOSIS — K219 Gastro-esophageal reflux disease without esophagitis: Secondary | ICD-10-CM

## 2024-01-29 DIAGNOSIS — Z96659 Presence of unspecified artificial knee joint: Secondary | ICD-10-CM | POA: Diagnosis not present

## 2024-01-29 DIAGNOSIS — L03011 Cellulitis of right finger: Secondary | ICD-10-CM | POA: Diagnosis not present

## 2024-01-29 DIAGNOSIS — J449 Chronic obstructive pulmonary disease, unspecified: Secondary | ICD-10-CM | POA: Diagnosis not present

## 2024-01-29 DIAGNOSIS — M1990 Unspecified osteoarthritis, unspecified site: Secondary | ICD-10-CM | POA: Diagnosis not present

## 2024-01-29 DIAGNOSIS — S61219A Laceration without foreign body of unspecified finger without damage to nail, initial encounter: Secondary | ICD-10-CM | POA: Diagnosis not present

## 2024-01-29 DIAGNOSIS — S61001A Unspecified open wound of right thumb without damage to nail, initial encounter: Secondary | ICD-10-CM | POA: Diagnosis not present

## 2024-01-29 DIAGNOSIS — Z885 Allergy status to narcotic agent status: Secondary | ICD-10-CM | POA: Diagnosis not present

## 2024-01-29 DIAGNOSIS — G894 Chronic pain syndrome: Secondary | ICD-10-CM | POA: Diagnosis not present

## 2024-01-29 DIAGNOSIS — G4700 Insomnia, unspecified: Secondary | ICD-10-CM | POA: Diagnosis not present

## 2024-01-29 DIAGNOSIS — Z760 Encounter for issue of repeat prescription: Secondary | ICD-10-CM | POA: Diagnosis not present

## 2024-01-29 DIAGNOSIS — F17210 Nicotine dependence, cigarettes, uncomplicated: Secondary | ICD-10-CM | POA: Diagnosis not present

## 2024-01-29 DIAGNOSIS — I10 Essential (primary) hypertension: Secondary | ICD-10-CM | POA: Diagnosis not present

## 2024-01-29 DIAGNOSIS — T8484XA Pain due to internal orthopedic prosthetic devices, implants and grafts, initial encounter: Secondary | ICD-10-CM | POA: Diagnosis not present

## 2024-01-29 DIAGNOSIS — Z748 Other problems related to care provider dependency: Secondary | ICD-10-CM

## 2024-01-29 DIAGNOSIS — E1169 Type 2 diabetes mellitus with other specified complication: Secondary | ICD-10-CM | POA: Diagnosis not present

## 2024-01-29 DIAGNOSIS — Z96651 Presence of right artificial knee joint: Secondary | ICD-10-CM | POA: Diagnosis not present

## 2024-01-29 DIAGNOSIS — Z791 Long term (current) use of non-steroidal anti-inflammatories (NSAID): Secondary | ICD-10-CM | POA: Diagnosis not present

## 2024-01-29 DIAGNOSIS — E785 Hyperlipidemia, unspecified: Secondary | ICD-10-CM | POA: Diagnosis not present

## 2024-01-29 DIAGNOSIS — Z59819 Housing instability, housed unspecified: Secondary | ICD-10-CM | POA: Diagnosis not present

## 2024-01-29 DIAGNOSIS — L089 Local infection of the skin and subcutaneous tissue, unspecified: Secondary | ICD-10-CM | POA: Diagnosis not present

## 2024-01-29 MED ORDER — DEXLANSOPRAZOLE 60 MG PO CPDR: 1.0000 | DELAYED_RELEASE_CAPSULE | Freq: Every day | ORAL | 0 refills | Status: DC

## 2024-01-29 MED ORDER — TRELEGY ELLIPTA 100-62.5-25 MCG/ACT IN AEPB: 1.0000 | INHALATION_SPRAY | Freq: Every day | RESPIRATORY_TRACT | 11 refills | Status: DC

## 2024-01-29 MED ORDER — ALBUTEROL SULFATE HFA 108 (90 BASE) MCG/ACT IN AERS: 2.0000 | INHALATION_SPRAY | Freq: Four times a day (QID) | RESPIRATORY_TRACT | 2 refills | Status: DC | PRN

## 2024-01-29 NOTE — Patient Instructions (Addendum)
 Go to ER today/right now to have them check the infection in your thumb

## 2024-01-29 NOTE — Progress Notes (Signed)
 Patient ID: Wendy Johnson, female    DOB: Nov 01, 1965, 58 y.o.   MRN: [id number]  PCP: Glenard Mire, MD  Chief Complaint  Patient presents with   Laceration    R thumb, x4 days. Cut on old/dirty knife, wound has pus.    Subjective:   Wendy Johnson is a 58 y.o. female, presents to clinic with CC of the following:  HPI  Right thumb wound/infection after minor cut occurred Friday 3 d ago  Discussed the use of AI scribe software for clinical note transcription with the patient, who gave verbal consent to proceed.  History of Present Illness Wendy Johnson is a 58 year old female who presents with a worsening infection on her thumb following a cut.  Thumb laceration with secondary infection - Sustained a cut on the thumb, likely from a knife, on Friday - By Saturday morning, the wound worsened and resembled a severe paper cut - Attempted to use a needle on the area on Saturday morning without improvement, also applied peroxide - Thumb is oozing pus - Severe pain, making it difficult to touch the area - Swelling and erythema present - No definite fevers, but intermittent subjective feverish sensation - Removed a ring from the affected hand due to swelling  Immunization status - Tetanus vaccination is up to date, received this year  History of hand arthritis - History of arthritis in the hands, possibly relevant to current symptoms  Medication use- asks for refills, has not been able to get an appt with her PCP - Currently using Trelegy and albuterol  inhalers, needs refills - Takes Dexilant  60 mg once daily for reflux  Barriers to care - Difficulty obtaining transportation for medical visits due to loss of driving license and reliance on others for rides    Patient Active Problem List   Diagnosis Date Noted   Left-sided chest wall pain 09/10/2021   Eczema of external ear, right 09/10/2021   Headache, chronic daily 09/10/2021   Hypertension, benign  09/10/2021   S/P revision of total knee, right 02/11/2021   Esophageal dysphagia    History of colonic polyps    Sciatica, right side 02/18/2019   Pain in joint, shoulder region 12/19/2018   Unspecified inflammatory spondylopathy, lumbar region (HCC) 10/04/2018   BMI 40.0-44.9, adult (HCC) 08/17/2018   Lumbar spondylosis 03/19/2018   Polyarthralgia 03/19/2018   History of alcoholism (HCC) 07/19/2017   Bilateral carpal tunnel syndrome 05/29/2017   Painful total knee replacement, right (HCC) 10/11/2016   Rotator cuff tendinitis, right 07/29/2016   Occipital neuralgia 10/12/2015   Instability of prosthetic knee (HCC) 09/22/2015   Primary osteoarthritis of right hip 05/12/2015   Sleep apnea 04/07/2015   Primary osteoarthritis of left hip 12/16/2014   Metabolic syndrome 11/26/2014   Migraine without aura and with status migrainosus, not intractable 11/26/2014   Gastroesophageal reflux disease without esophagitis 11/26/2014   COPD, moderate (HCC) 11/26/2014   Nocturnal oxygen  desaturation 11/26/2014   Supplemental oxygen  dependent 11/26/2014   Hearing loss 11/26/2014   Pain syndrome, chronic 11/26/2014   History of hypertension 11/26/2014   Dyslipidemia 11/26/2014   Morbid obesity (HCC) 11/26/2014   Chronic constipation 11/26/2014   Generalized anxiety disorder 11/11/2014   DDD (degenerative disc disease), lumbar 11/11/2014   Facet arthritis of lumbar region 11/11/2014   Primary osteoarthritis involving multiple joints 11/11/2014   H/O hysterectomy for benign disease 10/20/2014   Low back derangement syndrome 09/30/2014   Bipolar disorder (HCC) 09/30/2014  Current Outpatient Medications:    albuterol  (VENTOLIN  HFA) 108 (90 Base) MCG/ACT inhaler, INHALE 2 PUFFS INTO THE LUNGS EVERY 6 HOURS AS NEEDED FOR WHEEZING OR SHORTNESS OF BREATH (Patient not taking: Reported on 01/29/2024), Disp: 8.5 g, Rfl: 2   amphetamine -dextroamphetamine  (ADDERALL) 15 MG tablet, Take 15 mg by mouth  daily in the afternoon. (Patient not taking: Reported on 01/29/2024), Disp: , Rfl:    amphetamine -dextroamphetamine  (ADDERALL) 30 MG tablet, Take 30 mg by mouth 2 (two) times daily. (Patient not taking: Reported on 01/29/2024), Disp: , Rfl:    Aspirin -Caffeine  (BC FAST PAIN RELIEF ARTHRITIS PO), Take 1 packet by mouth 2 (two) times daily as needed (pain). (Patient not taking: Reported on 01/29/2024), Disp: , Rfl:    betamethasone  valerate (VALISONE ) 0.1 % cream, Apply topically 2 (two) times daily. (Patient not taking: Reported on 01/29/2024), Disp: 30 g, Rfl: 0   busPIRone  (BUSPAR ) 30 MG tablet, Take 30 mg by mouth 2 (two) times daily.  (Patient not taking: Reported on 01/29/2024), Disp: , Rfl:    Calcium  Carb-Cholecalciferol (CALCIUM  600 + D PO), Take 1 tablet by mouth 2 (two) times daily. (Patient not taking: Reported on 01/29/2024), Disp: , Rfl:    clonazePAM  (KLONOPIN ) 1 MG tablet, Take 1-2 mg by mouth 6 (six) times daily. (Patient not taking: Reported on 01/29/2024), Disp: , Rfl:    cyclobenzaprine  (FLEXERIL ) 10 MG tablet, Take 1 tablet (10 mg total) by mouth 3 (three) times daily as needed for muscle spasms. (Patient not taking: Reported on 01/29/2024), Disp: 30 tablet, Rfl: 0   dexlansoprazole  (DEXILANT ) 60 MG capsule, TAKE 1 CAPSULE BY MOUTH DAILY (Patient not taking: Reported on 01/29/2024), Disp: 90 capsule, Rfl: 0   Fluticasone -Umeclidin-Vilant (TRELEGY ELLIPTA ) 100-62.5-25 MCG/ACT AEPB, Inhale 1 puff into the lungs daily. (Patient not taking: Reported on 01/29/2024), Disp: 1 each, Rfl: 11   hydrOXYzine  (ATARAX ) 50 MG tablet, Take by mouth. (Patient not taking: Reported on 01/29/2024), Disp: , Rfl:    ibuprofen  (ADVIL ) 800 MG tablet, TAKE 1 TABLET BY MOUTH EVERY 8 HOURS AS NEEDED (Patient not taking: Reported on 01/29/2024), Disp: 90 tablet, Rfl: 0   ipratropium-albuterol  (DUONEB) 0.5-2.5 (3) MG/3ML SOLN, Take 3 mLs by nebulization every 4 (four) hours as needed. (Patient not taking: Reported on  01/29/2024), Disp: 360 mL, Rfl: 1   LATUDA  80 MG TABS tablet, Take 80 mg by mouth daily. (Patient not taking: Reported on 01/29/2024), Disp: , Rfl:    naloxone  (NARCAN ) nasal spray 4 mg/0.1 mL, As directed for opioid induced respiratory depression (Patient not taking: Reported on 01/29/2024), Disp: 1 each, Rfl: 1   phenazopyridine  (PYRIDIUM ) 100 MG tablet, Take 1 tablet (100 mg total) by mouth 3 (three) times daily as needed for pain. (Patient not taking: Reported on 01/29/2024), Disp: 10 tablet, Rfl: 0   Podiatric Products (GOLD BOND FOOT EX), Apply 1 application topically daily. (Patient not taking: Reported on 01/29/2024), Disp: , Rfl:    sertraline  (ZOLOFT ) 100 MG tablet, Take 200 mg by mouth daily. (Patient not taking: Reported on 01/29/2024), Disp: , Rfl:    sucralfate  (CARAFATE ) 1 g tablet, Take 1 tablet (1 g total) by mouth 4 (four) times daily for 10 days. (Patient not taking: Reported on 01/29/2024), Disp: 40 tablet, Rfl: 0   traZODone  (DESYREL ) 100 MG tablet, Take 400 mg by mouth at bedtime.  (Patient not taking: Reported on 01/29/2024), Disp: , Rfl:    valsartan  (DIOVAN ) 80 MG tablet, TAKE 1 TABLET BY MOUTH DAILY (Patient not taking:  Reported on 01/29/2024), Disp: 30 tablet, Rfl: 0   Allergies  Allergen Reactions   Codeine Nausea Only   Imitrex [Sumatriptan] Other (See Comments)    chest pain     Social History   Tobacco Use   Smoking status: Every Day    Current packs/day: 0.50    Average packs/day: 0.5 packs/day for 44.2 years (22.1 ttl pk-yrs)    Types: Cigarettes    Start date: 11/26/1979   Smokeless tobacco: Never  Vaping Use   Vaping status: Never Used  Substance Use Topics   Alcohol use: No    Alcohol/week: 0.0 standard drinks of alcohol    Comment: but used to be a heavy drinker, quit after DUI 2021   Drug use: No    Comment: quit crack cocaine  2004      Chart Review Today: I personally reviewed active problem list, medication list, allergies, family history, social  history, health maintenance, notes from last encounter, lab results, imaging with the patient/caregiver today.   Review of Systems  Constitutional: Negative.   HENT: Negative.    Eyes: Negative.   Respiratory: Negative.    Cardiovascular: Negative.   Gastrointestinal: Negative.   Endocrine: Negative.   Genitourinary: Negative.   Musculoskeletal: Negative.   Skin: Negative.   Allergic/Immunologic: Negative.   Neurological: Negative.   Hematological: Negative.   Psychiatric/Behavioral: Negative.    All other systems reviewed and are negative.      Objective:   Vitals:   01/29/24 1128  BP: 136/88  Pulse: 84  Resp: 16  SpO2: 98%  Weight: 179 lb (81.2 kg)  Height: 5' 1 (1.549 m)    Body mass index is 33.82 kg/m.  Physical Exam Vitals and nursing note reviewed.  Constitutional:      General: She is not in acute distress.    Appearance: She is not toxic-appearing or diaphoretic.  HENT:     Head: Normocephalic and atraumatic.     Right Ear: External ear normal.     Left Ear: External ear normal.  Cardiovascular:     Rate and Rhythm: Normal rate.  Pulmonary:     Effort: Pulmonary effort is normal. No respiratory distress.  Musculoskeletal:     Right hand: Tenderness present. Decreased range of motion.     Comments: Right thumb wound scabbing with bloody discharge and crusting with surrounding erythema and edema along right thumb and some down into hand, right index finder slightly swollen Right thumb she will not allow me to palpate near wound, very ttp, limited ROM she will not flex or extend thumb See photos  Neurological:     Mental Status: She is alert.         Results for orders placed or performed in visit on 12/20/23  ToxASSURE Select 13 (MW), Urine   Collection Time: 12/20/23  1:51 AM  Result Value Ref Range   Summary FINAL        Assessment & Plan:   1. Cellulitis of thumb, right (Primary) 2. Open wound of right thumb, initial encounter  -  due to location, severity of pain and infection rapidly over 3 d, and inability to move thumb plus swelling redness, purulent discharge pt was sent to the ED to ensure she did not need IV abx/hand surgeon consult or OR wash out - concerns for rapidly spreading infection/deep space infection with anatomical area, severity, pt overall seems to have poorly managed chronic health and I feels she's at risk for worsening infection.  She  asked for outpt abx, but I explained I felt the risk was too high for me to make that decision and wait another 24-48 hours with her sever pain, swelling, limited ROM, inability to sleep, purulent discharge etc and barriers to care, risk high for wosening infection, injury, sepsis, loss of function, longer tx and recovery - the ER team and/or hand surgeon can make that determination today and coordinate close f/up with hand specialists once they evaluate her.  3. Medication refill She asked for refills - encouraged her to make f/up appt with PCP asap  - Fluticasone -Umeclidin-Vilant (TRELEGY ELLIPTA ) 100-62.5-25 MCG/ACT AEPB; Inhale 1 puff into the lungs daily.  Dispense: 1 each; Refill: 11 - albuterol  (VENTOLIN  HFA) 108 (90 Base) MCG/ACT inhaler; Inhale 2 puffs into the lungs every 6 (six) hours as needed for wheezing or shortness of breath.  Dispense: 8.5 g; Refill: 2 - dexlansoprazole  (DEXILANT ) 60 MG capsule; Take 1 capsule (60 mg total) by mouth daily.  Dispense: 90 capsule; Refill: 0  4. COPD exacerbation (HCC) Med refills only per pt request - Fluticasone -Umeclidin-Vilant (TRELEGY ELLIPTA ) 100-62.5-25 MCG/ACT AEPB; Inhale 1 puff into the lungs daily.  Dispense: 1 each; Refill: 11 - albuterol  (VENTOLIN  HFA) 108 (90 Base) MCG/ACT inhaler; Inhale 2 puffs into the lungs every 6 (six) hours as needed for wheezing or shortness of breath.  Dispense: 8.5 g; Refill: 2  5. Gastroesophageal reflux disease without esophagitis Med refills only per pt request - dexlansoprazole   (DEXILANT ) 60 MG capsule; Take 1 capsule (60 mg total) by mouth daily.  Dispense: 90 capsule; Refill: 0  6. Assistance needed with transportation [facility] referral - SW and SDOH referrals to help with getting to appts  Coordinate with a Child psychotherapist to assist with transportation   Assessment and Plan Assessment & Plan Right thumb soft tissue infection with abscess Acute bacterial soft tissue infection with possibly abscess/spreading infection in the right thumb, right hand dominant female, small cut or abrasion occurred Friday only 3 d ago Wound/infection characterized by severe swelling, redness, pus drainage, limited range of motion, and pain.  Potential involvement of deeper structures such as tendons or ligaments is indicated- pt did use a needle to poke it.  The infection is spreading to adjacent fingers, raising concern for rapid progression - Refer to the emergency room for evaluation by a hand surgeon. - Ensure immediate assessment for potential surgical intervention or IV antibiotics (defer to ER recommendations) - Avoid further manipulation with needles etyc, or application of alcohol or hydrogen peroxide to the wound - irrigation and bandaging will be done in ED - friend will give her a ride to ED, she may need help getting home - SW consult and resources discussed   COPD she asks for refills on inhalers - Refill Trelegy inhaler. - Refill albuterol  inhaler.  Gastroesophageal reflux disease (GERD) GERD management requires ongoing medication refills to control symptoms. She reports needing a refill for her medication. - Refill Dexilant  (dexlansoprazole ) 60 mg once daily.  Recording duration: 14 minutes    PT advised to go to ED for eval, she agreed and stated a friend would take her now Asked CMA to notify ED of pt coming via POV for concern of spreading infection in thumb/hand    Michelene Cower, PA-C 01/29/24 11:59 AM

## 2024-01-29 NOTE — Telephone Encounter (Signed)
 FYI Only or Action Required?: FYI only for provider.  Patient was last seen in primary care on 03/28/2023 by Bernardo Fend, DO.  Called Nurse Triage reporting Laceration.  Symptoms began several days ago.  Interventions attempted: Other: cleaned and applied peroxide.  Symptoms are: worsening.  Triage Disposition: See HCP Within 4 Hours (Or PCP Triage)  Patient/caregiver understands and will follow disposition?: Yes      Copied from CRM [id number]. Topic: Clinical - Red Word Triage >> Jan 29, 2024  9:45 AM Ivette P wrote: [facility] that prompted transfer to Nurse Triage: Pt got cut with a knife over weekend, picking up some stuff in backyard old forks spoons and bowls and was cut with a kniife  And infected. And looks terrible . Pt wants to come in now.     Reason for Disposition  [1] Looks infected (e.g., spreading redness, pus) AND [2] large red area (> 2 inches or 5 cm) or streak  Answer Assessment - Initial Assessment Questions 1. APPEARANCE of INJURY: What does the injury look like?      Looks infected with pus 2. ONSET: How long ago did the injury occur?      3 days ago  3. LOCATION: Where is the injury located?      Right hand thumb  5. BLEEDING: Is it bleeding now? If Yes, ask: Is it difficult to stop?      No bleeding  6. PAIN: Is there any pain? If Yes, ask: How bad is the pain? (Scale 0-10; or none, mild, moderate, severe)     Moderate to severe  7. MECHANISM: Tell me how it happened.      Cut on knife at home  8. TETANUS: When was your last tetanus booster?     09/30/2023  Protocols used: Cuts and [employer]

## 2024-01-30 ENCOUNTER — Telehealth: Payer: Self-pay | Admitting: *Deleted

## 2024-01-30 ENCOUNTER — Encounter: Payer: Self-pay | Admitting: *Deleted

## 2024-01-30 NOTE — Patient Outreach (Signed)
 Return call from patient. CCM referral discussed related to transportation needs. Initial assessment scheduled for 02/02/24 at 11:30am.  Lenn Mean, LCSW [place]  [facility], [facility] Licensed Clinical Social Worker  Direct Dial: [phone]

## 2024-02-02 ENCOUNTER — Other Ambulatory Visit: Admitting: *Deleted

## 2024-02-02 NOTE — Patient Outreach (Signed)
 Phone call to patient to complete initial assessment. Patient requested a call at a later time stating that she was at a doctor's appointment.   Appointment rescheduled for 02/15/24 at 11:30am.   Lenn Mean, LCSW [place]  [facility], [facility] Licensed Clinical Social Worker  Direct Dial: [phone]

## 2024-02-15 ENCOUNTER — Other Ambulatory Visit: Payer: Self-pay | Admitting: *Deleted

## 2024-02-16 NOTE — Patient Outreach (Addendum)
 Complex Care Management   Visit Note  02/16/2024  Name:  Wendy Johnson MRN: [id number] DOB: Jul 11, 1965  Situation: Referral received for Complex Care Management related to SDOH Barriers:  Transportation Medication management I obtained verbal consent from Patient.  Visit completed with Patient  on the phone on 02/15/24.  Background:   Past Medical History:  Diagnosis Date   ADHD (attention deficit hyperactivity disorder)    Anemia 09/10/2021   Anxiety    AR (allergic rhinitis)    Arthritis    Benign hypertension    NO MEDS   Bipolar disorder (HCC)    Chronic back pain    Chronic constipation    Chronic insomnia    COPD (chronic obstructive pulmonary disease) (HCC)    Deaf    RIGHT EAR   Decreased dorsalis pedis pulse    Depression    Dyslipidemia    Fatty liver    GERD (gastroesophageal reflux disease)    Hepatomegaly    High cholesterol    Hot flashes    Migraine with aura    OCD (obsessive compulsive disorder)    Pain in joint, shoulder region 12/19/2018   Plantar warts    Sciatica, right side 02/18/2019   Severe obesity (HCC)    Shortness of breath dyspnea    Sleep apnea    NO CPAP   Tobacco use    Trochanteric bursitis of right hip     Assessment: Patient Reported Symptoms:  Cognitive        Neurological Neurological Review of Symptoms: No symptoms reported    HEENT HEENT Symptoms Reported: Other: (deaf in right ear since 58 years old)      Cardiovascular Cardiovascular Symptoms Reported: No symptoms reported    Respiratory Respiratory Symptoms Reported: Productive cough Additional Respiratory Details: COPD-uses inhalers Respiratory Management Strategies: Medication therapy Respiratory Self-Management Outcome: 3 (uncertain)  Endocrine Endocrine Symptoms Reported: No symptoms reported Is patient diabetic?: No    Gastrointestinal Gastrointestinal Symptoms Reported: No symptoms reported      Genitourinary Genitourinary Symptoms Reported: No  symptoms reported    Integumentary Integumentary Symptoms Reported: No symptoms reported    Musculoskeletal Musculoskelatal Symptoms Reviewed: Back pain, Limited mobility Additional Musculoskeletal Details: right knee replacement, 2 hip replacements, rods in legs Musculoskeletal Management Strategies: Adequate rest Falls in the past year?: No Number of falls in past year: 1 or less Was there an injury with Fall?: No Fall Risk Category Calculator: 0 Patient Fall Risk Level: Low Fall Risk    Psychosocial Psychosocial Symptoms Reported: Depression - if selected complete PHQ 2-9, Anxiety - if selected complete GAD Additional Psychological Details: Was seen by [facility] one month ago but would like to see another provider-per patient, doctor discontinued her medications to start over on new ones-started on Prozac and trazadone-has not started taking it-was seen by [facility], however psychiatrist  retired Research scientist (physical sciences) Management Strategies: Coping strategies   Quality of Family Relationships: supportive Do you feel physically threatened by others?: No    02/16/2024    PHQ2-9 Depression Screening   Little interest or pleasure in doing things Several days  Feeling down, depressed, or hopeless Several days  PHQ-2 - Total Score 2  Trouble falling or staying asleep, or sleeping too much Not at all  Feeling tired or having little energy Not at all  Poor appetite or overeating  Not at all  Feeling bad about yourself - or that you are a failure or have let yourself or your family down  Not at all  Trouble concentrating on things, such as reading the newspaper or watching television More than half the days  Moving or speaking so slowly that other people could have noticed.  Or the opposite - being so fidgety or restless that you have been moving around a lot more than usual Not at all  Thoughts that you would be better off dead, or hurting yourself in some way Not at all  PHQ2-9  Total Score 4  If you checked off any problems, how difficult have these problems made it for you to do your work, take care of things at home, or get along with other people    Depression Interventions/Treatment Referral to Psychiatry, Currently on Treatment (had been at [facility] but would like to change providers)    There were no vitals filed for this visit.  Medications Reviewed Today     Reviewed by Ermalinda Lenn HERO, LCSW (Social Worker) on 02/15/24 at 1137  Med List Status: <None>   Medication Order Taking? Sig Documenting Provider Last Dose Status Informant  albuterol  (VENTOLIN  HFA) 108 (90 Base) MCG/ACT inhaler [id number] Yes Inhale 2 puffs into the lungs every 6 (six) hours as needed for wheezing or shortness of breath. Tapia, Leisa, PA-C  Active   amphetamine -dextroamphetamine  (ADDERALL) 15 MG tablet [id number]  Take 15 mg by mouth daily in the afternoon.  Patient not taking: Reported on 02/15/2024   Daniel Lauth, MD  Active Self           Med Note NORVEL, [facility]   Mon Jan 29, 2024 11:09 AM)    amphetamine -dextroamphetamine  (ADDERALL) 30 MG tablet [id number]  Take 30 mg by mouth 2 (two) times daily.  Patient not taking: Reported on 02/15/2024   Daniel Lauth, MD  Active Self           Med Note NORVEL, [facility]   Mon Jan 29, 2024 11:09 AM)    Aspirin -Caffeine  (BC FAST PAIN RELIEF ARTHRITIS PO) [id number]  Take 1 packet by mouth 2 (two) times daily as needed (pain).  Patient not taking: Reported on 02/15/2024   [provider]  Active Self  betamethasone  valerate (VALISONE ) 0.1 % cream [id number]  Apply topically 2 (two) times daily.  Patient not taking: Reported on 02/15/2024   Sowles, Krichna, MD  Active   busPIRone  (BUSPAR ) 30 MG tablet [id number]  Take 30 mg by mouth 2 (two) times daily.   Patient not taking: Reported on 02/15/2024   Daniel Lauth, MD  Active Self           Med Note MARYELIZABETH, LUCIE ONEIDA [place] Jun 07, 2017 11:22 AM)    Calcium  Carb-Cholecalciferol (CALCIUM   600 + D PO) [id number]  Take 1 tablet by mouth 2 (two) times daily.  Patient not taking: Reported on 02/15/2024   [provider]  Active Self  clonazePAM  (KLONOPIN ) 1 MG tablet [id number]  Take 1-2 mg by mouth 6 (six) times daily.  Patient not taking: Reported on 02/15/2024   Daniel Lauth, MD  Active Self           Med Note SOILA, LYLE JAYSON [place] Nov 01, 2018  8:36 AM)    cyclobenzaprine  (FLEXERIL ) 10 MG tablet [id number]  Take 1 tablet (10 mg total) by mouth 3 (three) times daily as needed for muscle spasms.  Patient not taking: Reported on 02/15/2024   Myra Lynwood MATSU, MD  Active   dexlansoprazole  (DEXILANT ) 60 MG capsule [id number] Yes Take 1 capsule (  60 mg total) by mouth daily. Tapia, Leisa, PA-C  Active   Fluticasone -Umeclidin-Vilant (TRELEGY ELLIPTA ) 100-62.5-25 MCG/ACT AEPB [id number] Yes Inhale 1 puff into the lungs daily. Tapia, Leisa, PA-C  Active   hydrOXYzine  (ATARAX ) 50 MG tablet [id number]  Take by mouth.  Patient not taking: Reported on 02/15/2024   [provider]  Active   ibuprofen  (ADVIL ) 800 MG tablet [id number]  TAKE 1 TABLET BY MOUTH EVERY 8 HOURS AS NEEDED  Patient not taking: Reported on 02/15/2024   Sowles, Krichna, MD  Active   ipratropium-albuterol  (DUONEB) 0.5-2.5 (3) MG/3ML SOLN [id number]  Take 3 mLs by nebulization every 4 (four) hours as needed.  Patient not taking: Reported on 02/15/2024   Bernardo Fend, DO  Active   LATUDA  80 MG TABS tablet [id number]  Take 80 mg by mouth daily.  Patient not taking: Reported on 02/15/2024   [provider]  Active Self  naloxone  (NARCAN ) nasal spray 4 mg/0.1 mL [id number]  As directed for opioid induced respiratory depression  Patient not taking: Reported on 02/15/2024   Myra Lynwood MATSU, MD  Active Self  phenazopyridine  (PYRIDIUM ) 100 MG tablet [id number]  Take 1 tablet (100 mg total) by mouth 3 (three) times daily as needed for pain.  Patient not taking: Reported on 02/15/2024   Gareth Mliss FALCON, FNP  Active    Podiatric Products (GOLD BOND FOOT [place]) [id number]  Apply 1 application topically daily.  Patient not taking: Reported on 02/15/2024   [provider]  Active Self  sertraline  (ZOLOFT ) 100 MG tablet [id number]  Take 200 mg by mouth daily.  Patient not taking: Reported on 02/15/2024   Daniel Lauth, MD  Active Self  sucralfate  (CARAFATE ) 1 g tablet [id number]  Take 1 tablet (1 g total) by mouth 4 (four) times daily for 10 days.  Patient not taking: Reported on 02/15/2024   Angelena Smalls, MD  Expired 09/22/22 2359   traZODone  (DESYREL ) 100 MG tablet [id number]  Take 400 mg by mouth at bedtime.   Patient not taking: Reported on 02/15/2024   Daniel Lauth, MD  Active Self  valsartan  (DIOVAN ) 80 MG tablet [id number]  TAKE 1 TABLET BY MOUTH DAILY  Patient not taking: Reported on 02/15/2024   Sowles, Krichna, MD  Active   Med List Note Broadus Reda CROME, RN 12/20/23 1334): UDS 12-20-23 MR  10-09-23 08-14-23 Order for [employer] faxed. Received response from Redell Gent from [employer] statiing patinet could not be reached to complete the order.            Recommendation:   PCP Follow-up Medication management-resources to be provided Referral to be [facility] to [facility] for transportation to medical appointments   Follow Up Plan:   Telephone follow up appointment date/time:  03/07/24  Lenn Mean, LCSW [place]  [facility], [facility] Licensed Clinical Social Worker  Direct Dial: [phone]

## 2024-02-16 NOTE — Patient Instructions (Signed)
 Visit Information  Thank you for taking time to visit with me today. Please don't hesitate to contact me if I can be of assistance to you before our next scheduled appointment.  Our next appointment is by telephone on 03/07/24 at 11am Please call the care guide team at [phone] if you need to cancel or reschedule your appointment.   Following is a copy of your care plan:   Goals Addressed             This Visit's Progress    VBCI Social Work Care Plan       Problems:   Lacks knowledge of how to connect to transportation and mental health resources  CSW Clinical Goal(s):   Over the next 90 days the Patient will work with Child psychotherapist to coordinate transportation and ongoing mental health support to address needs related to transportation to medical appointments and ongoing mental health follow up   evidenced by completion of transportation assessment and  initial appointment with mental health provider.  Interventions:  Mental Health:  Evaluation of current treatment plan related to medication management and transportation needs Active listening / Reflection utilized Behavioral Activation reviewed Discussed referral for psychiatry: reviewed alternatives for medication management/benefits of close follow up reinforced Emotional Support Provided PHQ2/PHQ9 completed GAD 7 completed Problem Solving /Task Center strategies reviewed:explained process for medicaid transportation, referral to be completed for medication transportation    Patient Goals/Self-Care Activities:  Patient to expect call from medicaid transportation to complete assessment  Plan:   Telephone follow up appointment with care management team member scheduled for:  03/07/24        Please call the Suicide and Crisis Lifeline: 988 call the [place]  National Suicide Prevention Lifeline: (218)343-8536 or TTY: (779) 456-2859 TTY 2673283272) to talk to a trained counselor call 1-800-273-TALK (toll free,  24 hour hotline) call 911 if you are experiencing a Mental Health or Behavioral Health Crisis or need someone to talk to.  The patient verbalized understanding of instructions, educational materials, and care plan provided today and DECLINED offer to receive copy of patient instructions, educational materials, and care plan.   Mckynna Vanloan, LCSW   [facility], [facility] Licensed Clinical Social Worker  Direct Dial: [phone]

## 2024-03-07 ENCOUNTER — Telehealth: Payer: Self-pay | Admitting: *Deleted

## 2024-03-07 ENCOUNTER — Encounter: Payer: Self-pay | Admitting: *Deleted

## 2024-03-07 NOTE — Patient Instructions (Signed)
 Wendy Johnson - I am sorry I was unable to reach you today for our scheduled appointment. I work with Sowles, Krichna, MD and am calling to support your healthcare needs. Please contact me at [phone] at your earliest convenience. I look forward to speaking with you soon.   Thank you,    Indi Willhite, LCSW [place]  [facility], [facility] Licensed Clinical Social Worker  Direct Dial: [phone]

## 2024-03-14 ENCOUNTER — Encounter: Payer: Self-pay | Admitting: *Deleted

## 2024-03-14 NOTE — Progress Notes (Signed)
 This encounter was created in error - please disregard.

## 2024-03-14 NOTE — Patient Instructions (Signed)
 Wendy Johnson - I am sorry I was unable to reach you today. I work with Sowles, Krichna, MD and am calling to support your healthcare needs. Please contact me at [phone] at your earliest convenience. I look forward to speaking with you soon.   Thank you,    Krystyne Tewksbury, LCSW [place]  [facility], [facility] Licensed Clinical Social Worker  Direct Dial: [phone]

## 2024-03-14 NOTE — Patient Instructions (Signed)
.  Aly Seidenberg Blanke - I am sorry I was unable to reach you today . I work with Sowles, Krichna, MD and am calling to support your healthcare needs. Please contact me at [phone] at your earliest convenience. I look forward to speaking with you soon.   Thank you,    Anayely Constantine, LCSW [place]  [facility], [facility] Licensed Clinical Social Worker  Direct Dial: [phone]

## 2024-03-28 ENCOUNTER — Encounter: Payer: Self-pay | Admitting: *Deleted

## 2024-03-28 ENCOUNTER — Telehealth: Payer: Self-pay | Admitting: *Deleted

## 2024-03-28 NOTE — Patient Instructions (Signed)
 Britanee Vanblarcom Diosdado - I have attempted to call you three times but have been unsuccessful in reaching you. I work with Sowles, Krichna, MD and am calling to support your healthcare needs. If I can be of assistance to you, please contact me at [phone].     Thank you,    Morayma Godown, LCSW [place]  [facility], [facility] Licensed Clinical Social Worker  Direct Dial: [phone]

## 2024-06-10 ENCOUNTER — Ambulatory Visit: Payer: Self-pay

## 2024-06-12 ENCOUNTER — Ambulatory Visit: Admitting: Family Medicine

## 2024-06-12 ENCOUNTER — Encounter: Payer: Self-pay | Admitting: Family Medicine

## 2024-06-12 VITALS — BP 142/94 | HR 79 | Resp 16 | Ht 61.0 in | Wt 198.1 lb

## 2024-06-12 DIAGNOSIS — Z1211 Encounter for screening for malignant neoplasm of colon: Secondary | ICD-10-CM

## 2024-06-12 DIAGNOSIS — F3161 Bipolar disorder, current episode mixed, mild: Secondary | ICD-10-CM

## 2024-06-12 DIAGNOSIS — R399 Unspecified symptoms and signs involving the genitourinary system: Secondary | ICD-10-CM

## 2024-06-12 DIAGNOSIS — K219 Gastro-esophageal reflux disease without esophagitis: Secondary | ICD-10-CM

## 2024-06-12 DIAGNOSIS — Z23 Encounter for immunization: Secondary | ICD-10-CM

## 2024-06-12 DIAGNOSIS — Z760 Encounter for issue of repeat prescription: Secondary | ICD-10-CM

## 2024-06-12 DIAGNOSIS — Z1231 Encounter for screening mammogram for malignant neoplasm of breast: Secondary | ICD-10-CM

## 2024-06-12 DIAGNOSIS — J410 Simple chronic bronchitis: Secondary | ICD-10-CM

## 2024-06-12 DIAGNOSIS — I10 Essential (primary) hypertension: Secondary | ICD-10-CM

## 2024-06-12 LAB — POCT URINALYSIS DIPSTICK
Bilirubin, UA: NEGATIVE
Blood, UA: NEGATIVE
Glucose, UA: NEGATIVE
Ketones, UA: NEGATIVE
Leukocytes, UA: NEGATIVE
Nitrite, UA: NEGATIVE
Protein, UA: NEGATIVE
Spec Grav, UA: 1.02
Urobilinogen, UA: 0.2 U/dL
pH, UA: 5

## 2024-06-12 MED ORDER — VALSARTAN 80 MG PO TABS: 80.0000 mg | ORAL_TABLET | Freq: Every day | ORAL | 0 refills | Status: AC

## 2024-06-12 MED ORDER — TAMSULOSIN HCL 0.4 MG PO CAPS: 0.4000 mg | ORAL_CAPSULE | Freq: Every day | ORAL | 0 refills | Status: AC

## 2024-06-12 MED ORDER — SERTRALINE HCL 100 MG PO TABS: 100.0000 mg | ORAL_TABLET | Freq: Every day | ORAL | 0 refills | Status: AC

## 2024-06-12 MED ORDER — BUSPIRONE HCL 15 MG PO TABS: 15.0000 mg | ORAL_TABLET | Freq: Two times a day (BID) | ORAL | 0 refills | Status: AC

## 2024-06-12 MED ORDER — TRELEGY ELLIPTA 100-62.5-25 MCG/ACT IN AEPB: 1.0000 | INHALATION_SPRAY | Freq: Every day | RESPIRATORY_TRACT | 5 refills | Status: AC

## 2024-06-12 MED ORDER — ALBUTEROL SULFATE HFA 108 (90 BASE) MCG/ACT IN AERS: 2.0000 | INHALATION_SPRAY | Freq: Four times a day (QID) | RESPIRATORY_TRACT | 2 refills | Status: AC | PRN

## 2024-06-12 MED ORDER — TRAZODONE HCL 100 MG PO TABS: 100.0000 mg | ORAL_TABLET | Freq: Every day | ORAL | 0 refills | Status: AC

## 2024-06-12 MED ORDER — RISPERIDONE 2 MG PO TABS: 2.0000 mg | ORAL_TABLET | Freq: Every day | ORAL | 0 refills | Status: AC

## 2024-06-12 MED ORDER — DEXLANSOPRAZOLE 60 MG PO CPDR: 1.0000 | DELAYED_RELEASE_CAPSULE | Freq: Every day | ORAL | 0 refills | Status: AC

## 2024-06-12 NOTE — Progress Notes (Signed)
 Name: Wendy Johnson   MRN: [id number]    DOB: 18-May-1965   Date:06/12/2024       Progress Note  Subjective  Chief Complaint  Chief Complaint  Patient presents with   Medical Management of Chronic Issues    Been out of meds since 6+months   Urinary Retention    X2 days    Discussed the use of AI scribe software for clinical note transcription with the patient, who gave verbal consent to proceed.  History of Present Illness Wendy Johnson is a 59 year old female with hypertension and COPD who presents with medication management and high blood pressure.  She has been experiencing elevated blood pressure and is seeking medication management for her hypertension. She has been without her blood pressure medication, valsartan , and requests a refill. She denies chest pain or palpitations.  She has a history of bipolar disorder and ADHD and has been without her psychiatric medications for approximately four months following the retirement of her psychiatrist. She feels depressed and describes herself as a 'manic depressive'. Previously, she was on Latuda , trazodone , buspirone , Adderall, and Klonopin . She needs medication to manage her bipolar disorder and depression, noting that her mind 'runs all the time'.  She reports gastrointestinal issues, including GERD, and mentions a recent episode of hematemesis, described as streaks of blood mixed in vomit. She has not had a colonoscopy in a long time and is interested in having both an endoscopy and colonoscopy. She wants to resume dexilant  for her GERD.  She has chronic bronchitis and has been using Trelegy and albuterol , although she is currently out of these medications. She reports occasional shortness of breath and states she has no cough or wheezing. She continues to smoke, albeit less than before, at a rate of one pack every three to four days.  She experiences urinary symptoms, specifically incomplete voiding She describes a sensation of  not fully emptying her bladder but denies fever, dysuria or urinary frequency.  Her social history includes a recent incarceration for three months, during which she did not receive her medications. She has a history of smoking.    Patient Active Problem List   Diagnosis Date Noted   Assistance needed with transportation 01/29/2024   Left-sided chest wall pain 09/10/2021   Eczema of external ear, right 09/10/2021   Headache, chronic daily 09/10/2021   Hypertension, benign 09/10/2021   S/P revision of total knee, right 02/11/2021   Esophageal dysphagia    History of colonic polyps    Sciatica, right side 02/18/2019   Pain in joint, shoulder region 12/19/2018   Unspecified inflammatory spondylopathy, lumbar region 10/04/2018   BMI 40.0-44.9, adult (HCC) 08/17/2018   Lumbar spondylosis 03/19/2018   Polyarthralgia 03/19/2018   History of alcoholism (HCC) 07/19/2017   Bilateral carpal tunnel syndrome 05/29/2017   Painful total knee replacement, right 10/11/2016   Rotator cuff tendinitis, right 07/29/2016   Occipital neuralgia 10/12/2015   Instability of prosthetic knee 09/22/2015   Primary osteoarthritis of right hip 05/12/2015   Sleep apnea 04/07/2015   Primary osteoarthritis of left hip 12/16/2014   Metabolic syndrome 11/26/2014   Migraine without aura and with status migrainosus, not intractable 11/26/2014   Gastroesophageal reflux disease without esophagitis 11/26/2014   COPD, moderate (HCC) 11/26/2014   Nocturnal oxygen  desaturation 11/26/2014   Supplemental oxygen  dependent 11/26/2014   Hearing loss 11/26/2014   Pain syndrome, chronic 11/26/2014   History of hypertension 11/26/2014   Dyslipidemia 11/26/2014   Morbid obesity (  HCC) 11/26/2014   Chronic constipation 11/26/2014   Generalized anxiety disorder 11/11/2014   DDD (degenerative disc disease), lumbar 11/11/2014   Facet arthritis of lumbar region 11/11/2014   Primary osteoarthritis involving multiple joints  11/11/2014   H/O hysterectomy for benign disease 10/20/2014   Low back derangement syndrome 09/30/2014   Bipolar disorder (HCC) 09/30/2014    Past Surgical History:  Procedure Laterality Date   ABDOMINAL HYSTERECTOMY N/A 10/20/2014   Procedure: Total abdominial hysterectomy, bilateral salpingo-oophorectomy;  Surgeon: Gladis DELENA Dollar, MD;  Location: [facility];  Service: Gynecology;  Laterality: N/A;   BILATERAL SALPINGOOPHORECTOMY     bone spurs removed Bilateral    CARPAL TUNNEL RELEASE Left 06/08/2017   Procedure: CARPAL TUNNEL RELEASE;  Surgeon: Kathlynn Sharper, MD;  Location: [facility];  Service: Orthopedics;  Laterality: Left;   CARPAL TUNNEL RELEASE Right 10/10/2017   Procedure: CARPAL TUNNEL RELEASE;  Surgeon: Kathlynn Sharper, MD;  Location: [facility];  Service: Orthopedics;  Laterality: Right;   COLONOSCOPY WITH PROPOFOL  N/A 09/01/2016   Procedure: COLONOSCOPY WITH PROPOFOL ;  Surgeon: Ruel Kung, MD;  Location: [facility];  Service: Endoscopy;  Laterality: N/A;   COLONOSCOPY WITH PROPOFOL  N/A 07/30/2019   Procedure: COLONOSCOPY WITH PROPOFOL ;  Surgeon: Jinny Carmine, MD;  Location: [facility];  Service: Endoscopy;  Laterality: N/A;   DORSAL COMPARTMENT RELEASE Left 06/08/2017   Procedure: RELEASE DORSAL COMPARTMENT (DEQUERVAIN);  Surgeon: Kathlynn Sharper, MD;  Location: [facility];  Service: Orthopedics;  Laterality: Left;   ESOPHAGOGASTRODUODENOSCOPY (EGD) WITH PROPOFOL  N/A 09/01/2016   Procedure: ESOPHAGOGASTRODUODENOSCOPY (EGD) WITH PROPOFOL ;  Surgeon: Ruel Kung, MD;  Location: [facility];  Service: Endoscopy;  Laterality: N/A;   ESOPHAGOGASTRODUODENOSCOPY (EGD) WITH PROPOFOL  N/A 07/30/2019   Procedure: ESOPHAGOGASTRODUODENOSCOPY (EGD) WITH PROPOFOL ;  Surgeon: Jinny Carmine, MD;  Location: [facility];  Service: Endoscopy;  Laterality: N/A;   FOOT SURGERY Bilateral    INSERTION OF MESH N/A 12/26/2017   Procedure: INSERTION OF MESH;  Surgeon: Jordis Laneta FALCON, MD;  Location: [facility];  Service: General;  Laterality: N/A;   JOINT REPLACEMENT Right    Total Knee replacement X 2   JOINT REPLACEMENT Bilateral    Total Hip Replacement   knee arthroscopo Right    LAPAROSCOPY  09/22/2014   Procedure: LAPAROSCOPY OPERATIVE;  Surgeon: Gladis DELENA Dollar, MD;  Location: [facility];  Service: Gynecology;;  excision and fulgeration of endomertriosis   LIPOMA EXCISION     ROBOTIC ASSISTED LAPAROSCOPIC VENTRAL/INCISIONAL HERNIA REPAIR N/A 12/26/2017   Procedure: ROBOTIC ASSISTED LAPAROSCOPIC VENTRAL/INCISIONAL HERNIA REPAIR;  Surgeon: Jordis Laneta FALCON, MD;  Location: [facility];  Service: General;  Laterality: N/A;   TONSILLECTOMY     TOTAL HIP ARTHROPLASTY Left 12/16/2014   Procedure: TOTAL HIP ARTHROPLASTY ANTERIOR APPROACH;  Surgeon: Sharper Kathlynn, MD;  Location: [facility];  Service: Orthopedics;  Laterality: Left;   TOTAL HIP ARTHROPLASTY Right 05/12/2015   Procedure: TOTAL HIP ARTHROPLASTY ANTERIOR APPROACH;  Surgeon: Sharper Kathlynn, MD;  Location: [facility];  Service: Orthopedics;  Laterality: Right;   TOTAL KNEE REVISION Right 09/22/2015   Procedure: TOTAL KNEE REVISION/ REVISE POLYIETHYLENE;  Surgeon: Sharper Kathlynn, MD;  Location: [facility];  Service: Orthopedics;  Laterality: Right;   TOTAL KNEE REVISION Right 10/11/2016   Procedure: TOTAL KNEE REVISION;  Surgeon: Kathlynn Sharper, MD;  Location: [facility];  Service: Orthopedics;  Laterality: Right;   TOTAL KNEE REVISION Right 02/11/2021   Procedure: TOTAL KNEE REVISION;  Surgeon: Kathlynn Sharper, MD;  Location: [facility];  Service: Orthopedics;  Laterality: Right;  TUBAL LIGATION     VENTRAL HERNIA REPAIR N/A 11/08/2018   Procedure: HERNIA REPAIR VENTRAL ADULT OPEN. DIABETIC, SLEEP APNEA;  Surgeon: Jordis Laneta FALCON, MD;  Location: [facility];  Service: General;  Laterality: N/A;    Family History  Problem Relation Age of Onset   Diabetes Mother    Heart disease Father    Heart attack Father    Diabetes Sister    Breast cancer Maternal Aunt         <50   Breast cancer Paternal Aunt        x2.  <50   Healthy Son    Drug abuse Sister     Social History   Tobacco Use   Smoking status: Every Day    Current packs/day: 0.50    Average packs/day: 0.5 packs/day for 44.5 years (22.3 ttl pk-yrs)    Types: Cigarettes    Start date: 11/26/1979   Smokeless tobacco: Never  Substance Use Topics   Alcohol use: No    Alcohol/week: 0.0 standard drinks of alcohol    Comment: but used to be a heavy drinker, quit after DUI 2021    Current Medications[1]  Allergies[2]  I personally reviewed active problem list, medication list, allergies, family history with the patient/caregiver today.   ROS  Ten systems reviewed and is negative except as mentioned in HPI    Objective Physical Exam MEASUREMENTS: BMI- 35.0. CONSTITUTIONAL: Patient appears well-developed and well-nourished. No distress. HEENT: Head atraumatic, normocephalic, neck supple. CARDIOVASCULAR: Normal rate, regular rhythm and normal heart sounds. No murmur heard. No BLE edema. PULMONARY: Effort normal and breath sounds normal. No respiratory distress. Lungs clear to auscultation bilaterally. ABDOMINAL: There is no tenderness or distention. MUSCULOSKELETAL: Normal gait. Without gross motor or sensory deficit. PSYCHIATRIC: Patient has a normal mood and affect. Behavior is normal. Judgment and thought content normal.  Vitals:   06/12/24 1254  BP: (!) 146/96  Pulse: 79  Resp: 16  SpO2: 97%  Weight: 198 lb 1.6 oz (89.9 kg)  Height: 5' 1 (1.549 m)    Body mass index is 37.43 kg/m.  Recent Results (from the past 2160 hours)  POCT urinalysis dipstick     Status: None   Collection Time: 06/12/24  1:06 PM  Result Value Ref Range   Color, UA Yellow    Clarity, UA Cloudy    Glucose, UA Negative Negative   Bilirubin, UA Negative    Ketones, UA Negative    Spec Grav, UA 1.020 1.010 - 1.025   Blood, UA Negative    pH, UA 5.0 5.0 - 8.0   Protein, UA Negative Negative    Urobilinogen, UA 0.2 0.2 or 1.0 E.U./dL   Nitrite, UA Negative    Leukocytes, UA Negative Negative   Appearance yellow    Odor none     Diabetic Foot Exam:     PHQ2/9:    06/12/2024   12:54 PM 02/15/2024   11:39 AM 01/29/2024   11:36 AM 12/20/2023    1:26 PM 08/07/2023    3:06 PM  Depression screen PHQ 2/9  Decreased Interest 0 1 0 1 1  Down, Depressed, Hopeless 0 1 1 1 1   PHQ - 2 Score 0 2 1 2 2   Altered sleeping  0 1    Tired, decreased energy  0 0    Change in appetite  0 0    Feeling bad or failure about yourself   0 0    Trouble concentrating  2  0    Moving slowly or fidgety/restless  0 0    Suicidal thoughts  0 0    PHQ-9 Score  4  2        Data saved with a previous flowsheet row definition    phq 9 is negative  Fall Risk:    06/12/2024   12:54 PM 02/15/2024   11:47 AM 12/20/2023    1:26 PM 08/07/2023    3:06 PM 03/28/2023    9:05 AM  Fall Risk   Falls in the past year? 0 0 0 0 0  Number falls in past yr: 0 0   0  Injury with Fall? 0 0    0   Risk for fall due to : No Fall Risks      Follow up Falls evaluation completed         Data saved with a previous flowsheet row definition      Assessment & Plan Bipolar disorder, current episode mixed Bipolar disorder with mixed episode, experiencing depression and insomnia. Prefers Risperdal  for calming effects. - Prescribed Risperdal  2 mg at night since Latuda  not on formularty  - Prescribed Zoloft  starting at 100 mg, to be titrated up by psychiatrist  - Prescribed trazodone  100 mg for sleep. She was previously taking 400 mg at night but will need to see psychiatrist for that - Explained that I will not prescribe Klonopin  or Adderal  - Referred to psychiatrist for further management.  Essential hypertension Hypertension, currently elevated. - Prescribed valsartan  80 mg daily.  Simple chronic bronchitis Chronic bronchitis with occasional shortness of breath. Smoking reduced to one pack every three to four  days. - Prescribed Trelegy for COPD management. - Advised smoking cessation.  Gastroesophageal reflux disease GERD with recent episode of vomiting with blood streaks. Requires further evaluation by GI specialist. - Prescribed dexilant  for GERD. - Referred to GI specialist for endoscopy and colonoscopy.  Morbid obesity BMI over 35 with associated HTN , indicating morbid obesity. Discussed weight loss benefits for overall health and blood pressure management. - Advised weight loss for health improvement.  Lower urinary tract symptoms Incomplete voiding. Urine culture normal. - Prescribed Flomax  to aid urination.  General health maintenance Due for mammogram and flu shot. Discussed pneumonia vaccination. - Referred for mammogram. - Discussed pneumonia vaccination.        [1]  Current Outpatient Medications:    risperiDONE  (RISPERDAL ) 2 MG tablet, Take 1 tablet (2 mg total) by mouth at bedtime., Disp: 90 tablet, Rfl: 0   tamsulosin  (FLOMAX ) 0.4 MG CAPS capsule, Take 1 capsule (0.4 mg total) by mouth daily., Disp: 30 capsule, Rfl: 0   albuterol  (VENTOLIN  HFA) 108 (90 Base) MCG/ACT inhaler, Inhale 2 puffs into the lungs every 6 (six) hours as needed for wheezing or shortness of breath., Disp: 8.5 g, Rfl: 2   busPIRone  (BUSPAR ) 15 MG tablet, Take 1 tablet (15 mg total) by mouth 2 (two) times daily., Disp: 180 tablet, Rfl: 0   dexlansoprazole  (DEXILANT ) 60 MG capsule, Take 1 capsule (60 mg total) by mouth daily., Disp: 90 capsule, Rfl: 0   Fluticasone -Umeclidin-Vilant (TRELEGY ELLIPTA ) 100-62.5-25 MCG/ACT AEPB, Inhale 1 puff into the lungs daily., Disp: 1 each, Rfl: 5   ibuprofen  (ADVIL ) 800 MG tablet, TAKE 1 TABLET BY MOUTH EVERY 8 HOURS AS NEEDED (Patient not taking: Reported on 06/12/2024), Disp: 90 tablet, Rfl: 0   sertraline  (ZOLOFT ) 100 MG tablet, Take 1 tablet (100 mg total) by mouth daily., Disp: 90 tablet, Rfl:  0   traZODone  (DESYREL ) 100 MG tablet, Take 1 tablet (100 mg total) by  mouth at bedtime., Disp: 90 tablet, Rfl: 0   valsartan  (DIOVAN ) 80 MG tablet, Take 1 tablet (80 mg total) by mouth daily., Disp: 90 tablet, Rfl: 0 [2]  Allergies Allergen Reactions   Codeine Nausea Only   Imitrex [Sumatriptan] Other (See Comments)    chest pain

## 2024-06-13 LAB — URINE CULTURE
MICRO NUMBER:: 17550802
Result:: NO GROWTH
SPECIMEN QUALITY:: ADEQUATE

## 2024-06-14 ENCOUNTER — Other Ambulatory Visit: Payer: Self-pay | Admitting: Family Medicine

## 2024-06-14 ENCOUNTER — Ambulatory Visit: Payer: Self-pay | Admitting: Family Medicine

## 2024-06-14 MED ORDER — OXYBUTYNIN CHLORIDE ER 5 MG PO TB24: 5.0000 mg | ORAL_TABLET | Freq: Every day | ORAL | 0 refills | Status: AC

## 2024-06-25 ENCOUNTER — Encounter

## 2024-08-13 ENCOUNTER — Ambulatory Visit: Admitting: Family Medicine
